# Patient Record
Sex: Female | Born: 1960 | Race: White | Hispanic: No | Smoking: Former smoker
Health system: Southern US, Community
[De-identification: ages and names within clinical notes are randomized; demographics above are authoritative.]

## PROBLEM LIST (undated history)

## (undated) DIAGNOSIS — N19 Unspecified kidney failure: Secondary | ICD-10-CM

## (undated) DIAGNOSIS — M797 Fibromyalgia: Secondary | ICD-10-CM

## (undated) DIAGNOSIS — Z87442 Personal history of urinary calculi: Secondary | ICD-10-CM

## (undated) DIAGNOSIS — Z22322 Carrier or suspected carrier of Methicillin resistant Staphylococcus aureus: Secondary | ICD-10-CM

## (undated) DIAGNOSIS — F410 Panic disorder [episodic paroxysmal anxiety] without agoraphobia: Secondary | ICD-10-CM

## (undated) DIAGNOSIS — T7840XA Allergy, unspecified, initial encounter: Secondary | ICD-10-CM

## (undated) DIAGNOSIS — J449 Chronic obstructive pulmonary disease, unspecified: Secondary | ICD-10-CM

## (undated) DIAGNOSIS — Z972 Presence of dental prosthetic device (complete) (partial): Secondary | ICD-10-CM

## (undated) DIAGNOSIS — R200 Anesthesia of skin: Secondary | ICD-10-CM

## (undated) DIAGNOSIS — F419 Anxiety disorder, unspecified: Secondary | ICD-10-CM

## (undated) DIAGNOSIS — L0291 Cutaneous abscess, unspecified: Secondary | ICD-10-CM

## (undated) DIAGNOSIS — F909 Attention-deficit hyperactivity disorder, unspecified type: Secondary | ICD-10-CM

## (undated) DIAGNOSIS — M62838 Other muscle spasm: Secondary | ICD-10-CM

## (undated) DIAGNOSIS — M1990 Unspecified osteoarthritis, unspecified site: Secondary | ICD-10-CM

## (undated) DIAGNOSIS — J189 Pneumonia, unspecified organism: Secondary | ICD-10-CM

## (undated) DIAGNOSIS — M549 Dorsalgia, unspecified: Secondary | ICD-10-CM

## (undated) DIAGNOSIS — F988 Other specified behavioral and emotional disorders with onset usually occurring in childhood and adolescence: Secondary | ICD-10-CM

## (undated) DIAGNOSIS — Q438 Other specified congenital malformations of intestine: Secondary | ICD-10-CM

## (undated) DIAGNOSIS — G8929 Other chronic pain: Secondary | ICD-10-CM

## (undated) DIAGNOSIS — F319 Bipolar disorder, unspecified: Secondary | ICD-10-CM

## (undated) HISTORY — DX: Unspecified osteoarthritis, unspecified site: M19.90

## (undated) HISTORY — DX: Anesthesia of skin: R20.0

## (undated) HISTORY — DX: Other muscle spasm: M62.838

## (undated) HISTORY — PX: INCISION AND DRAINAGE: SHX5863

## (undated) HISTORY — PX: BACK SURGERY: SHX140

## (undated) HISTORY — DX: Allergy, unspecified, initial encounter: T78.40XA

## (undated) HISTORY — PX: SHOULDER SURGERY: SHX246

## (undated) HISTORY — DX: Unspecified kidney failure: N19

## (undated) HISTORY — DX: Other specified congenital malformations of intestine: Q43.8

## (undated) HISTORY — PX: KNEE SURGERY: SHX244

## (undated) HISTORY — PX: NECK SURGERY: SHX720

## (undated) HISTORY — PX: CARPAL TUNNEL RELEASE: SHX101

---

## 1997-08-31 HISTORY — PX: ABDOMINAL HYSTERECTOMY: SHX81

## 1998-08-31 HISTORY — PX: HERNIA REPAIR: SHX51

## 2003-07-12 ENCOUNTER — Other Ambulatory Visit: Payer: Self-pay

## 2004-06-20 ENCOUNTER — Ambulatory Visit: Payer: Self-pay | Admitting: Specialist

## 2005-02-09 ENCOUNTER — Ambulatory Visit: Payer: Self-pay | Admitting: Surgery

## 2005-06-07 ENCOUNTER — Emergency Department: Payer: Self-pay | Admitting: Emergency Medicine

## 2005-06-12 ENCOUNTER — Other Ambulatory Visit: Payer: Self-pay

## 2005-06-12 ENCOUNTER — Inpatient Hospital Stay: Payer: Self-pay | Admitting: Internal Medicine

## 2005-11-19 ENCOUNTER — Ambulatory Visit: Payer: Self-pay | Admitting: Pain Medicine

## 2006-02-28 IMAGING — CR DG HAND COMPLETE 3+V*L*
1 series · 3 of 3 positions shown · non-contrast
Comparison: none

REASON FOR EXAM: swelling
COMMENTS:

PROCEDURE:     DXR - DXR HAND LT COMPLETE  W/OBLIQUES  - June 07, 2005  [DATE]
RESULT:       Three views reveals no acute fractures or dislocations.  The
joint spaces are intact.
There does appear soft tissue swelling particularly over the dorsum of the
metacarpals.

[Series 1: view not recorded · 0.17mm/px · 3 of 3 slices shown]
[im 1/3]
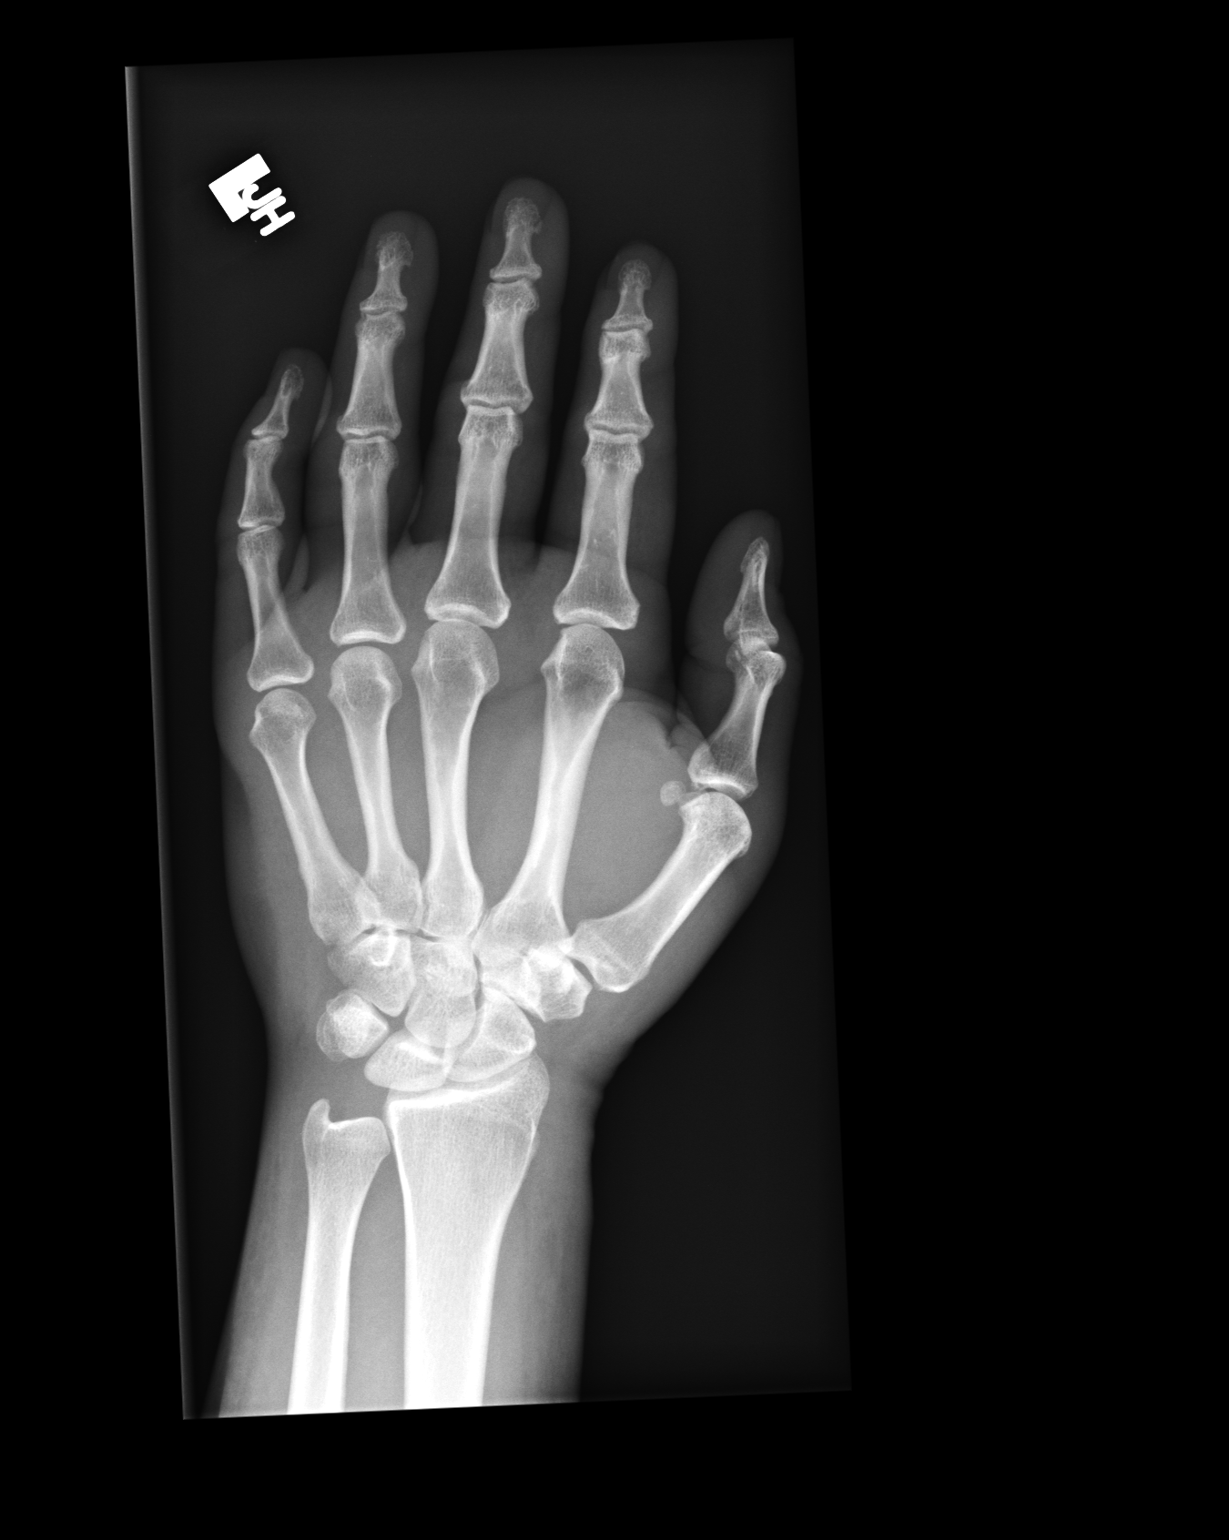
[im 2/3]
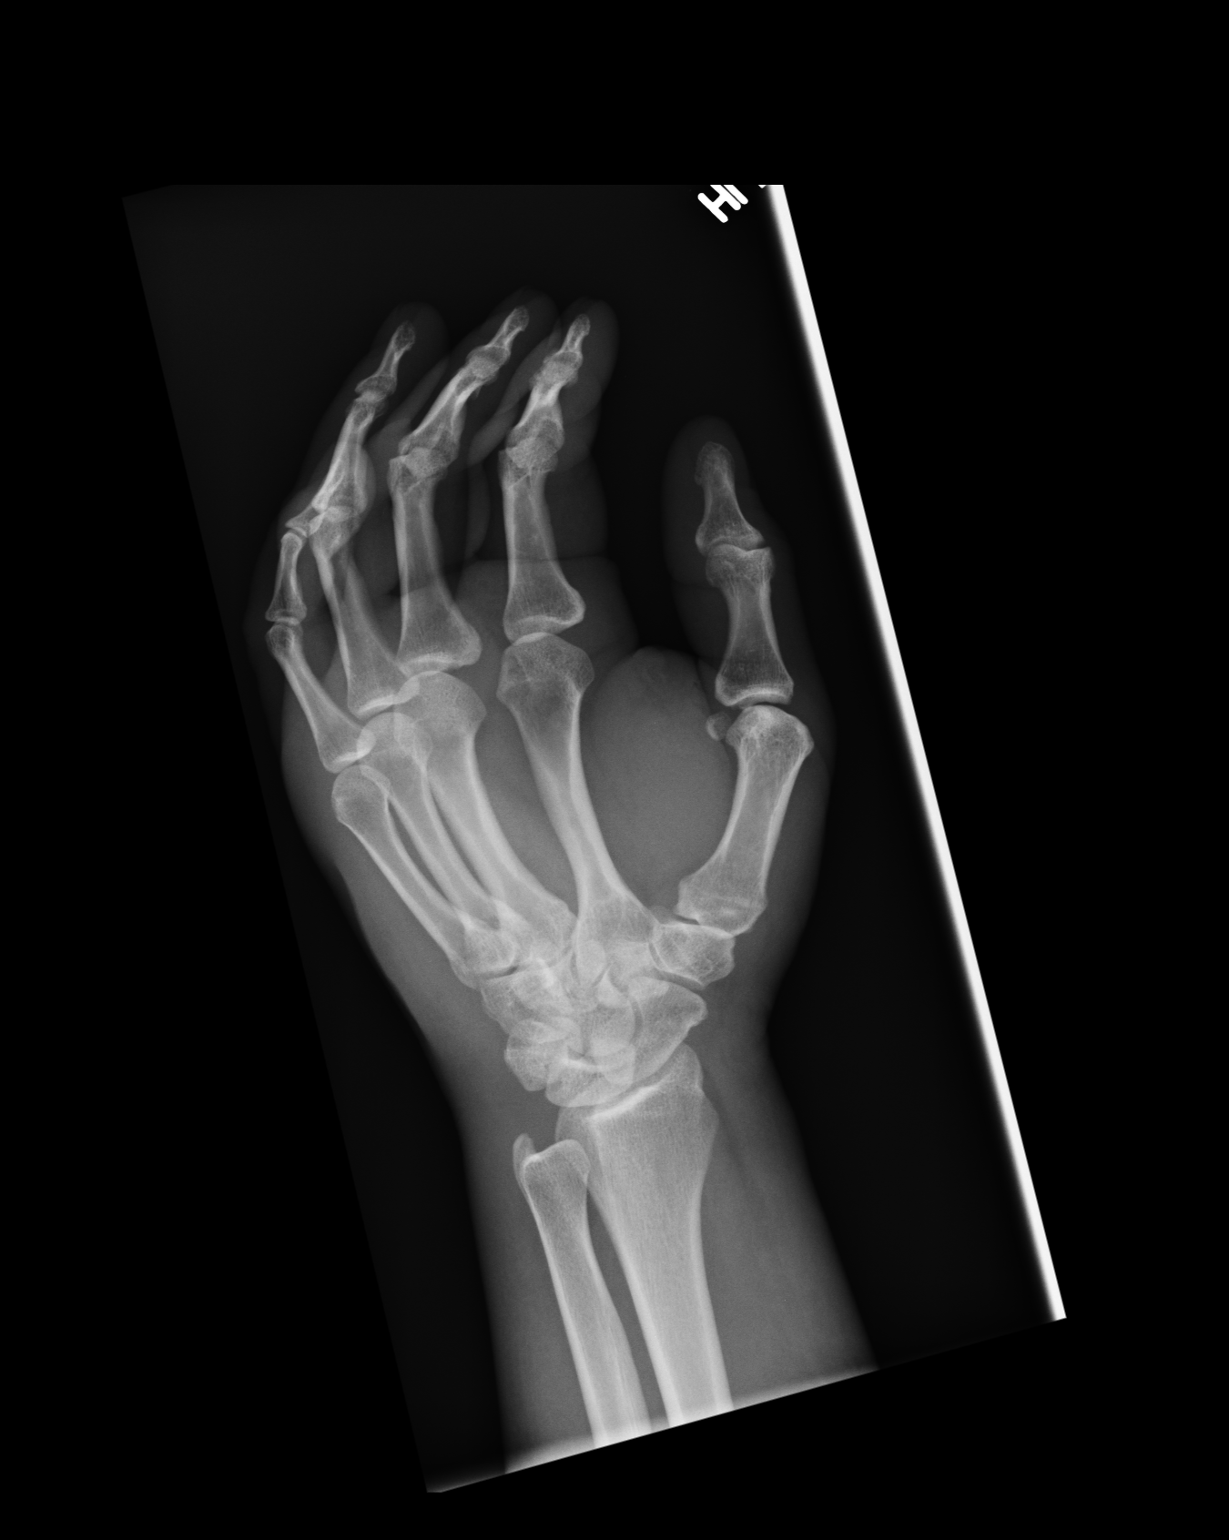
[im 3/3]
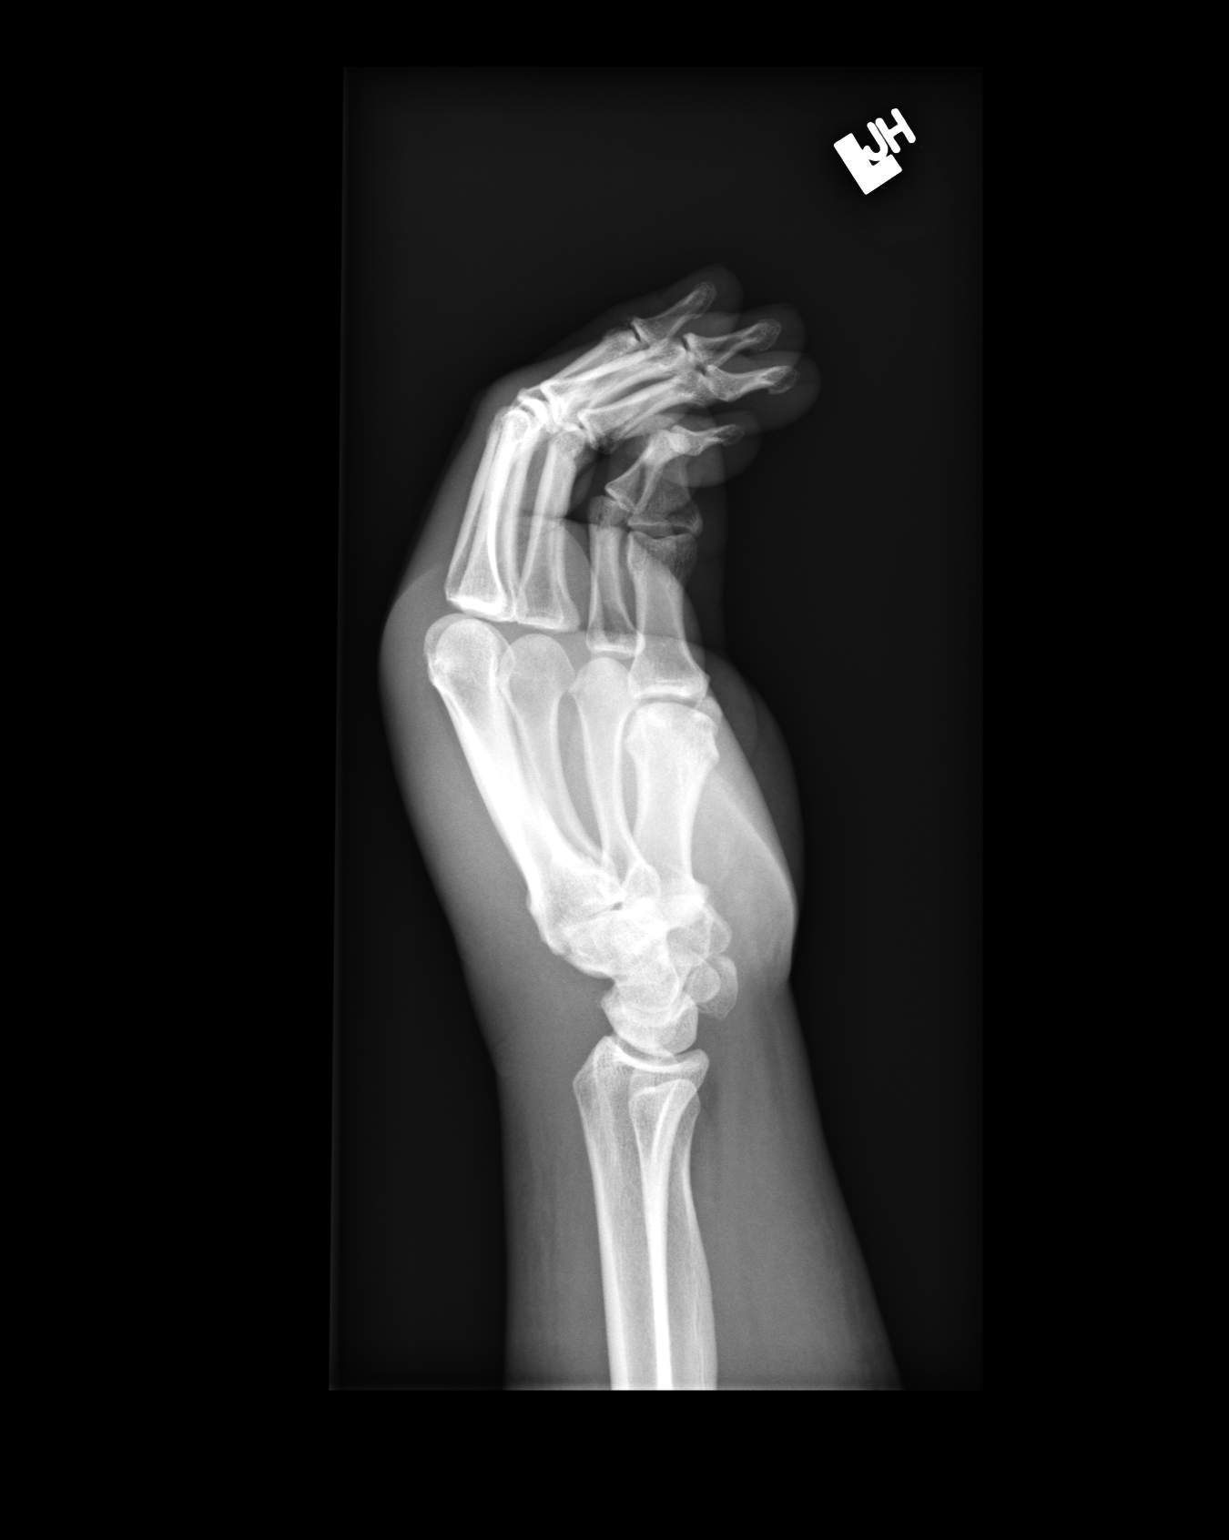

[3 of 3 positions shown; findings below may reference images not displayed]

IMPRESSION: No acute fractures are seen of the LEFT hand.  The underlying bones appear
intact.  Soft tissue swelling is noted.

## 2006-07-21 ENCOUNTER — Emergency Department: Payer: Self-pay | Admitting: Emergency Medicine

## 2006-09-03 ENCOUNTER — Inpatient Hospital Stay: Payer: Self-pay | Admitting: Psychiatry

## 2006-09-03 ENCOUNTER — Observation Stay: Payer: Self-pay | Admitting: Internal Medicine

## 2006-09-16 ENCOUNTER — Emergency Department: Payer: Self-pay | Admitting: Emergency Medicine

## 2006-09-30 ENCOUNTER — Ambulatory Visit: Payer: Self-pay | Admitting: Pain Medicine

## 2006-10-13 ENCOUNTER — Ambulatory Visit: Payer: Self-pay | Admitting: Pain Medicine

## 2006-12-02 ENCOUNTER — Emergency Department: Payer: Self-pay | Admitting: Unknown Physician Specialty

## 2007-02-17 ENCOUNTER — Emergency Department: Payer: Self-pay | Admitting: Emergency Medicine

## 2007-04-18 ENCOUNTER — Emergency Department: Payer: Self-pay | Admitting: Emergency Medicine

## 2007-05-27 IMAGING — CT CT HEAD WITHOUT CONTRAST
2 series · 16 of 30 positions shown, 20 images · non-contrast
Comparison: none

REASON FOR EXAM: AMS, [id number]
COMMENTS:

[Series 2: without · axial · non-contrast · 0.44mm/px · z∈[+877,+1000]mm · 13 of 31 slices shown, 17 images]
[im 3/31  brain]
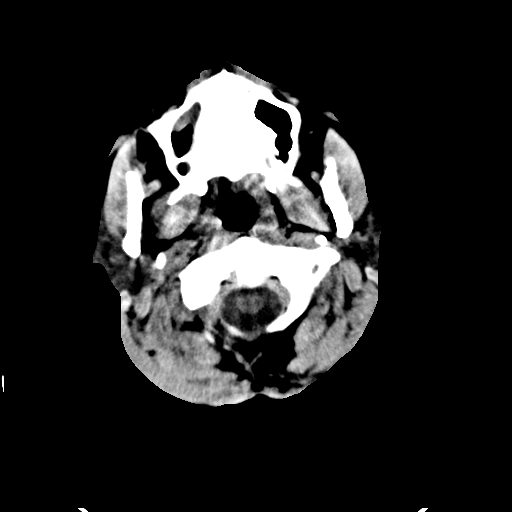
[im 3/31  bone]
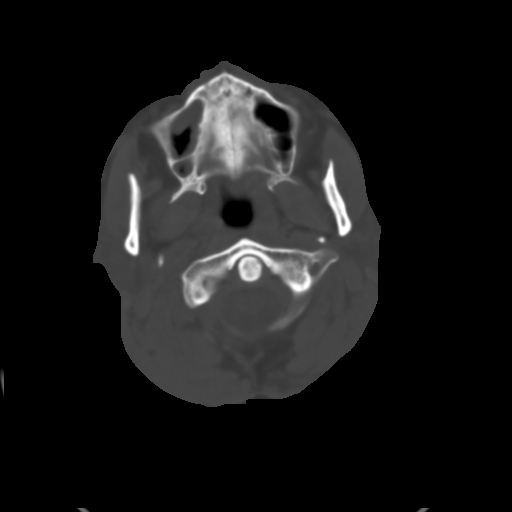
[im 5/31  brain]
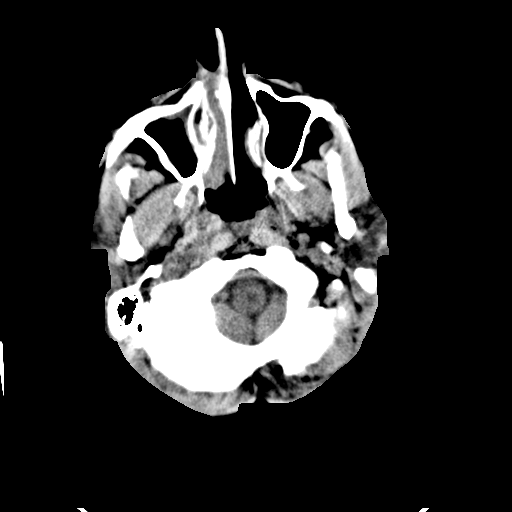
[im 7/31  brain]
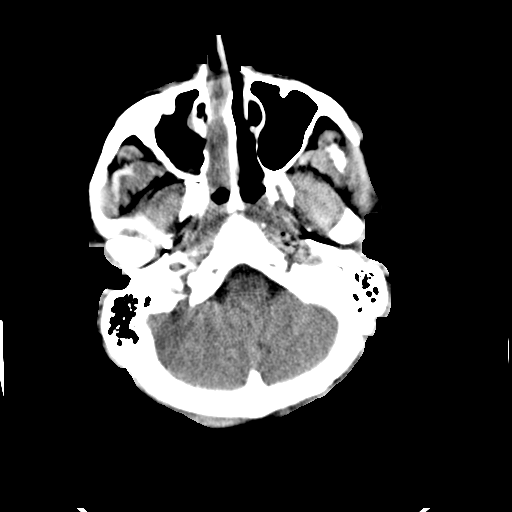
[im 9/31  brain]
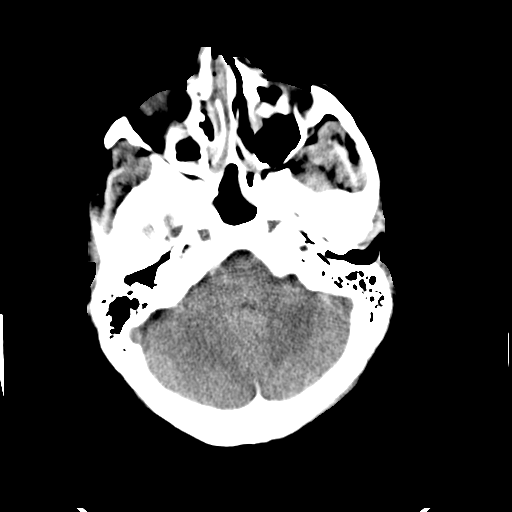
[im 11/31  brain]
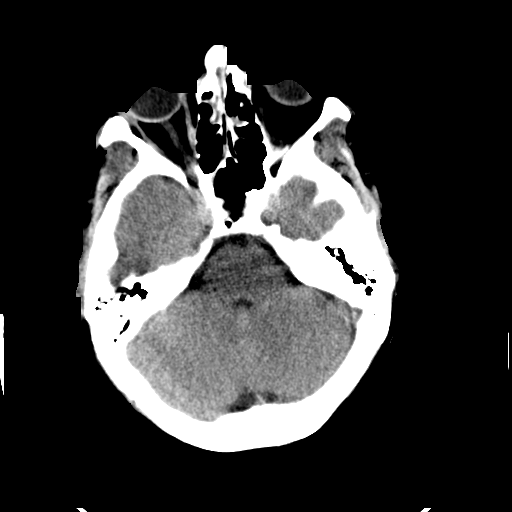
[im 11/31  bone]
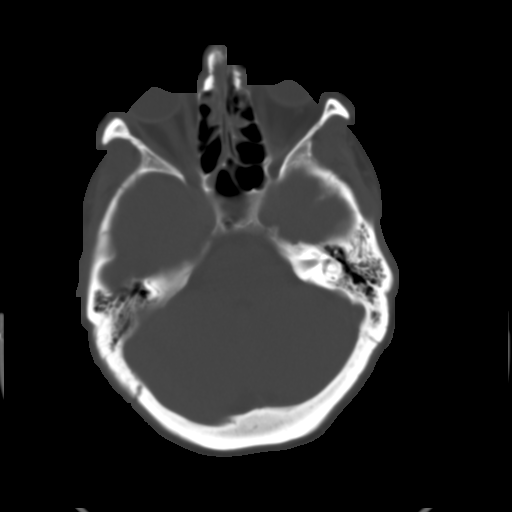
[im 13/31  brain]
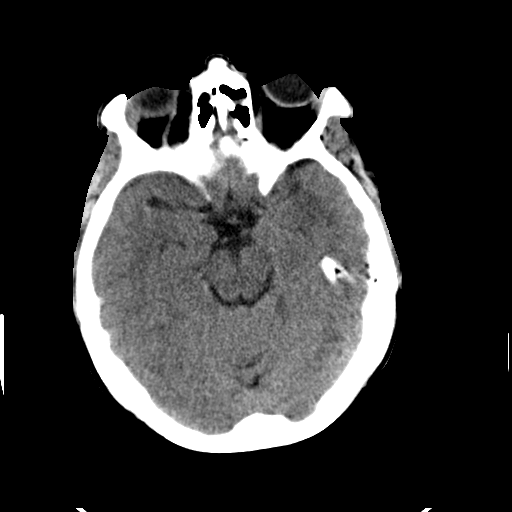
[im 16/31  brain]
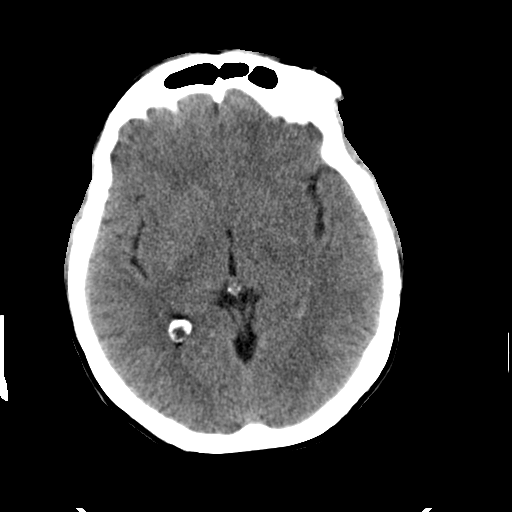
[im 18/31  brain]
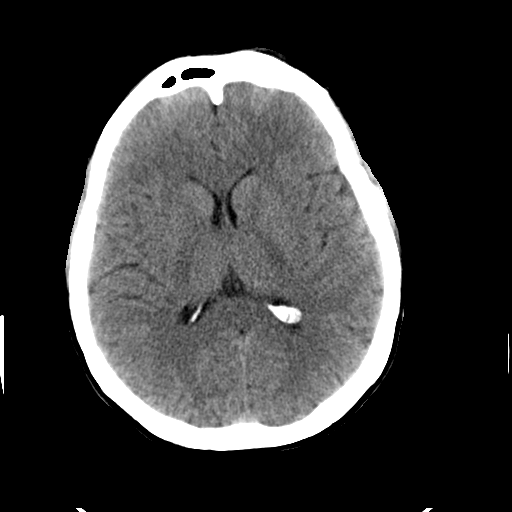
[im 20/31  brain]
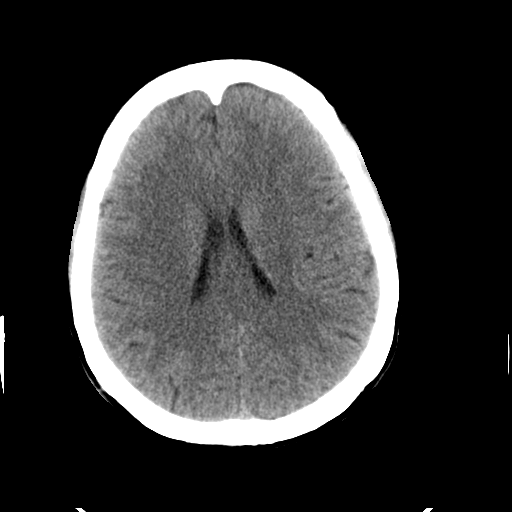
[im 20/31  bone]
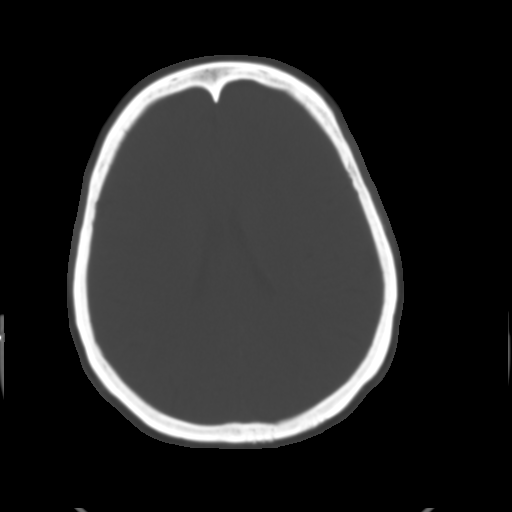
[im 22/31  brain]
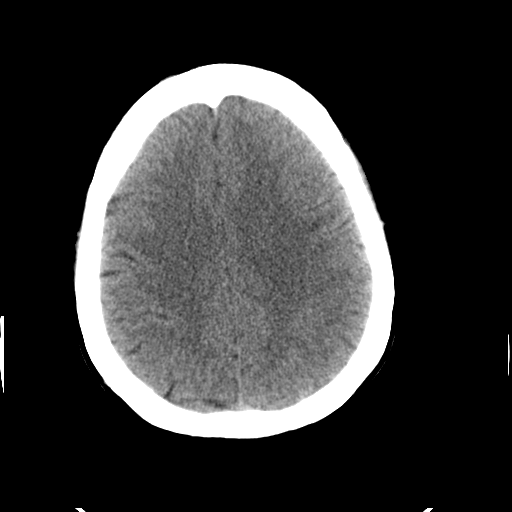
[im 24/31  brain]
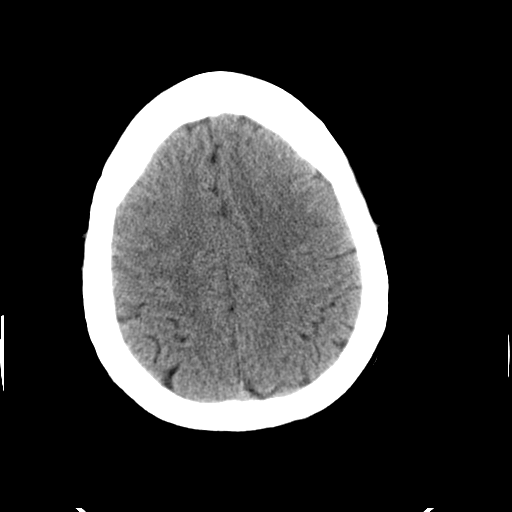
[im 26/31  brain]
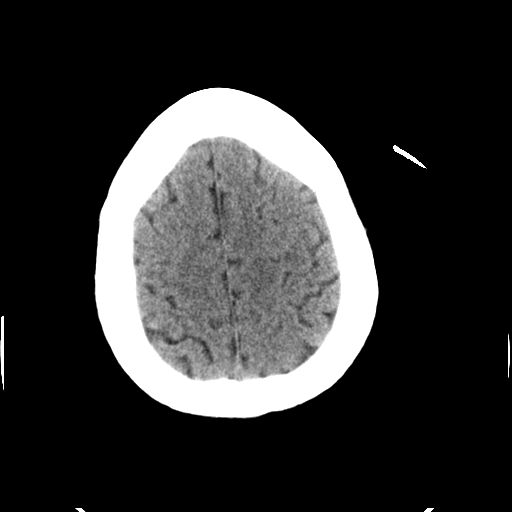
[im 28/31  brain]
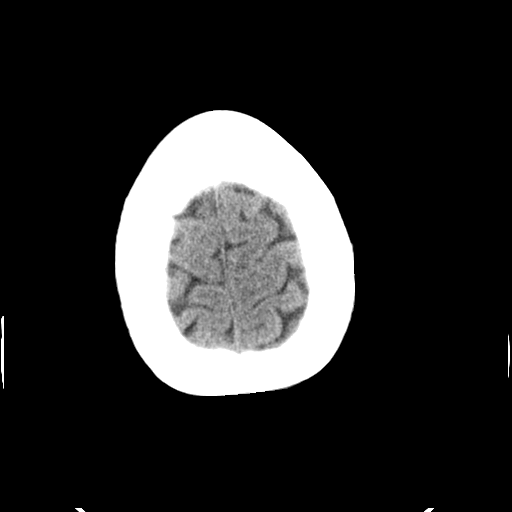
[im 28/31  bone]
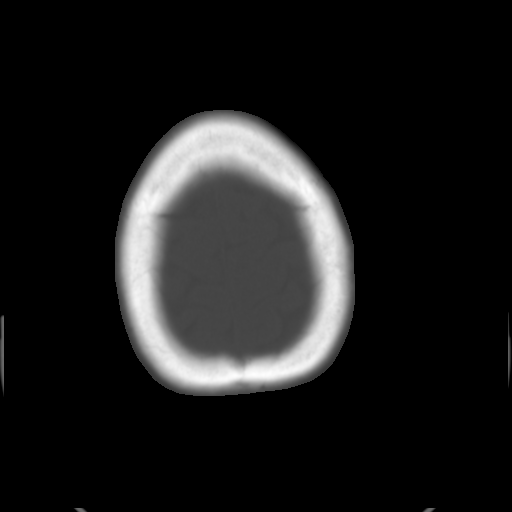

[Series 3: bone · axial · 0.44mm/px · z∈[+877,+917]mm · 3 of 31 slices shown]
[im 3/31  bone]
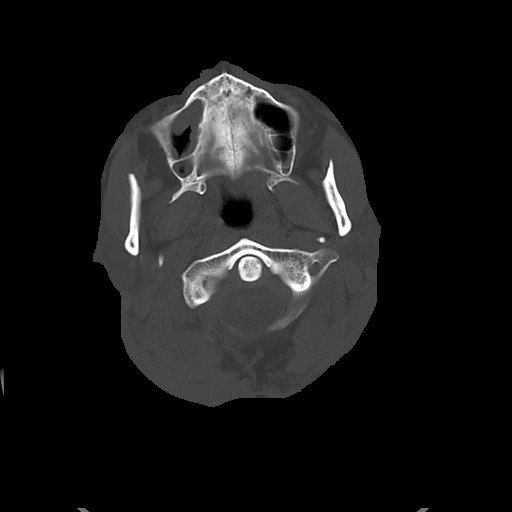
[im 7/31  bone]
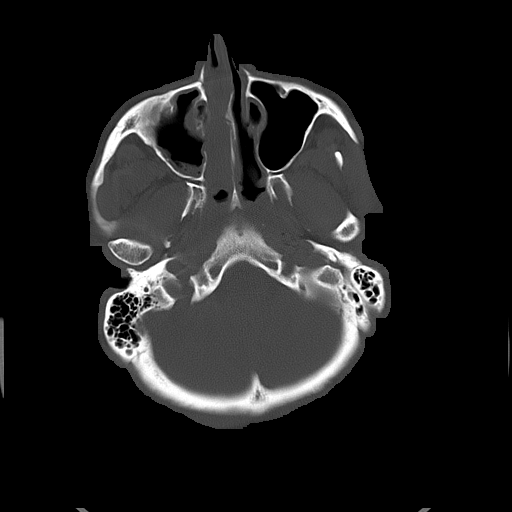
[im 11/31  bone]
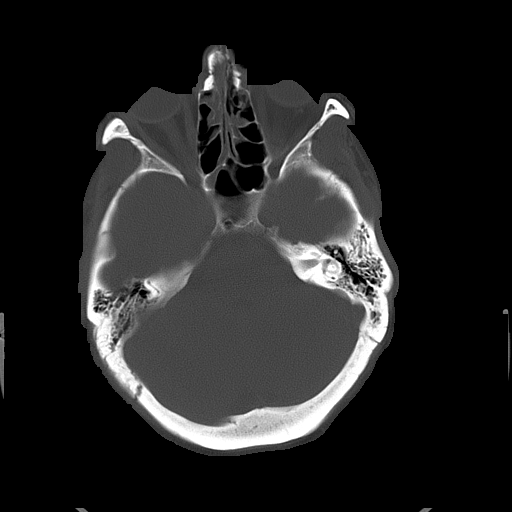

[16 of 30 positions shown; findings below may reference images not displayed]

PROCEDURE:     CT  - CT HEAD WITHOUT CONTRAST  - September 03, 2006  [DATE]

RESULT:       Unenhanced emergent Head CT was performed for altered mental
status.  Report was faxed to the emergency room.

No intracerebral bleed or infarcts.  No mass effect and no shift of the
midline.   No extraaxial fluid collections.  The ventricles appear within
normal limits in size.

On bone window settings, there is noted some opacification in the ethmoids
with minimal mucoperiosteal thickening in the RIGHT maxillary sinus which
can be compatible with a sinusitis.  The remainder of the sinuses are clear
as well as the mastoids.
IMPRESSION: 1.     No acute processes intracranially.
2.     RIGHT sphenoid and RIGHT maxillary sinuses.
3.     Incidentally noted is a fracture of the nasal bone in the RIGHT nasal
ala.

## 2007-05-27 IMAGING — CR DG CHEST 1V PORT
1 series · 1 of 1 positions shown · non-contrast
Comparison: none

REASON FOR EXAM: ams//rm19
COMMENTS:

PROCEDURE:     DXR - DXR PORTABLE CHEST SINGLE VIEW  - September 03, 2006  [DATE]
RESULT:     There is atelectasis at the RIGHT lung base.  The heart is top
normal in size.  The pulmonary vascularity is not engorged and I see no
pleural effusion. No acute bony abnormality is identified.

[view not recorded]
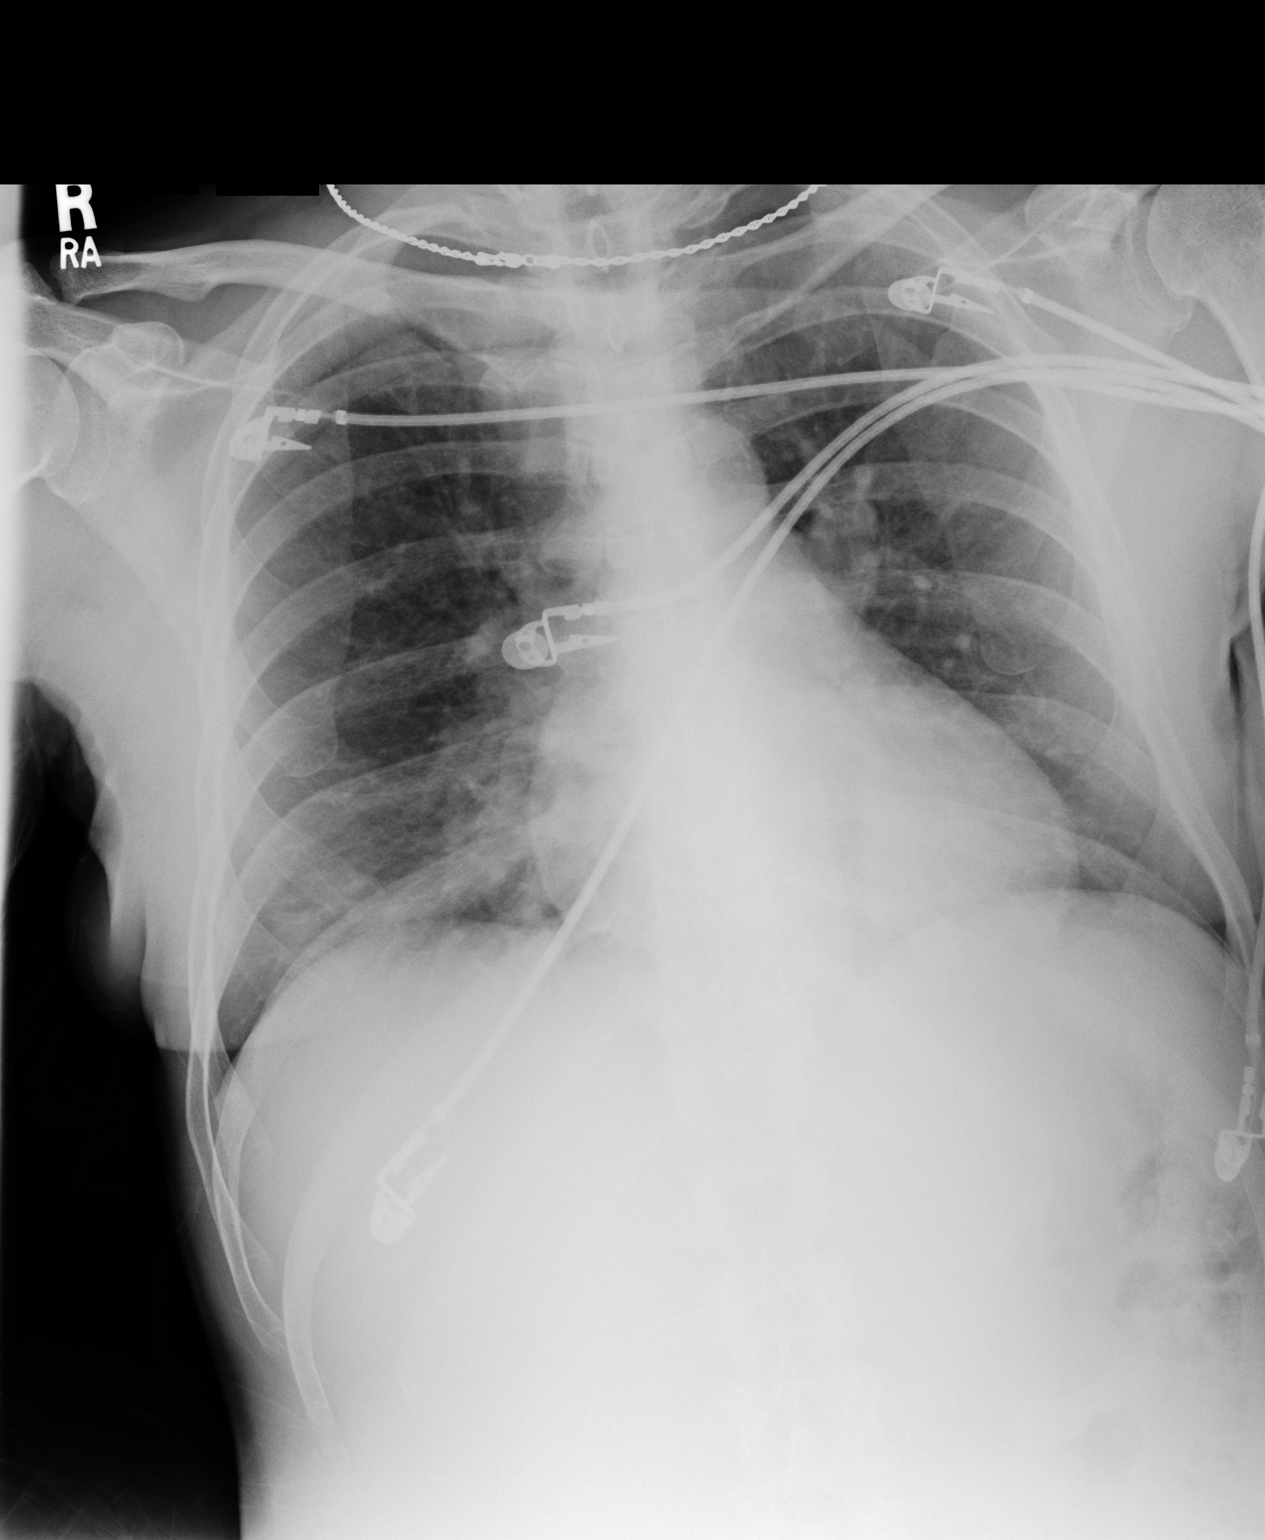

[1 of 1 positions shown; findings below may reference images not displayed]

IMPRESSION: 1)There is basilar atelectasis versus infiltrate on the RIGHT. There is
subtle increased density in the retrocardiac region on the LEFT.  A
follow-up PA and lateral chest x-ray would be of value.

## 2007-06-09 IMAGING — CR SACRUM AND COCCYX - 2+ VIEW
1 series · 3 of 3 positions shown · non-contrast
Comparison: none

REASON FOR EXAM: Fell
COMMENTS:  LMP: Post Hysterectomy

PROCEDURE:     DXR - DXR SACRUM AND COCCYX  - September 16, 2006 [DATE]
RESULT:     AP and lateral views of the sacrum and coccyx show no fracture
or other acute bony abnormality.

[Series 1: view not recorded · 0.17mm/px · 3 of 3 slices shown]
[im 1/3]
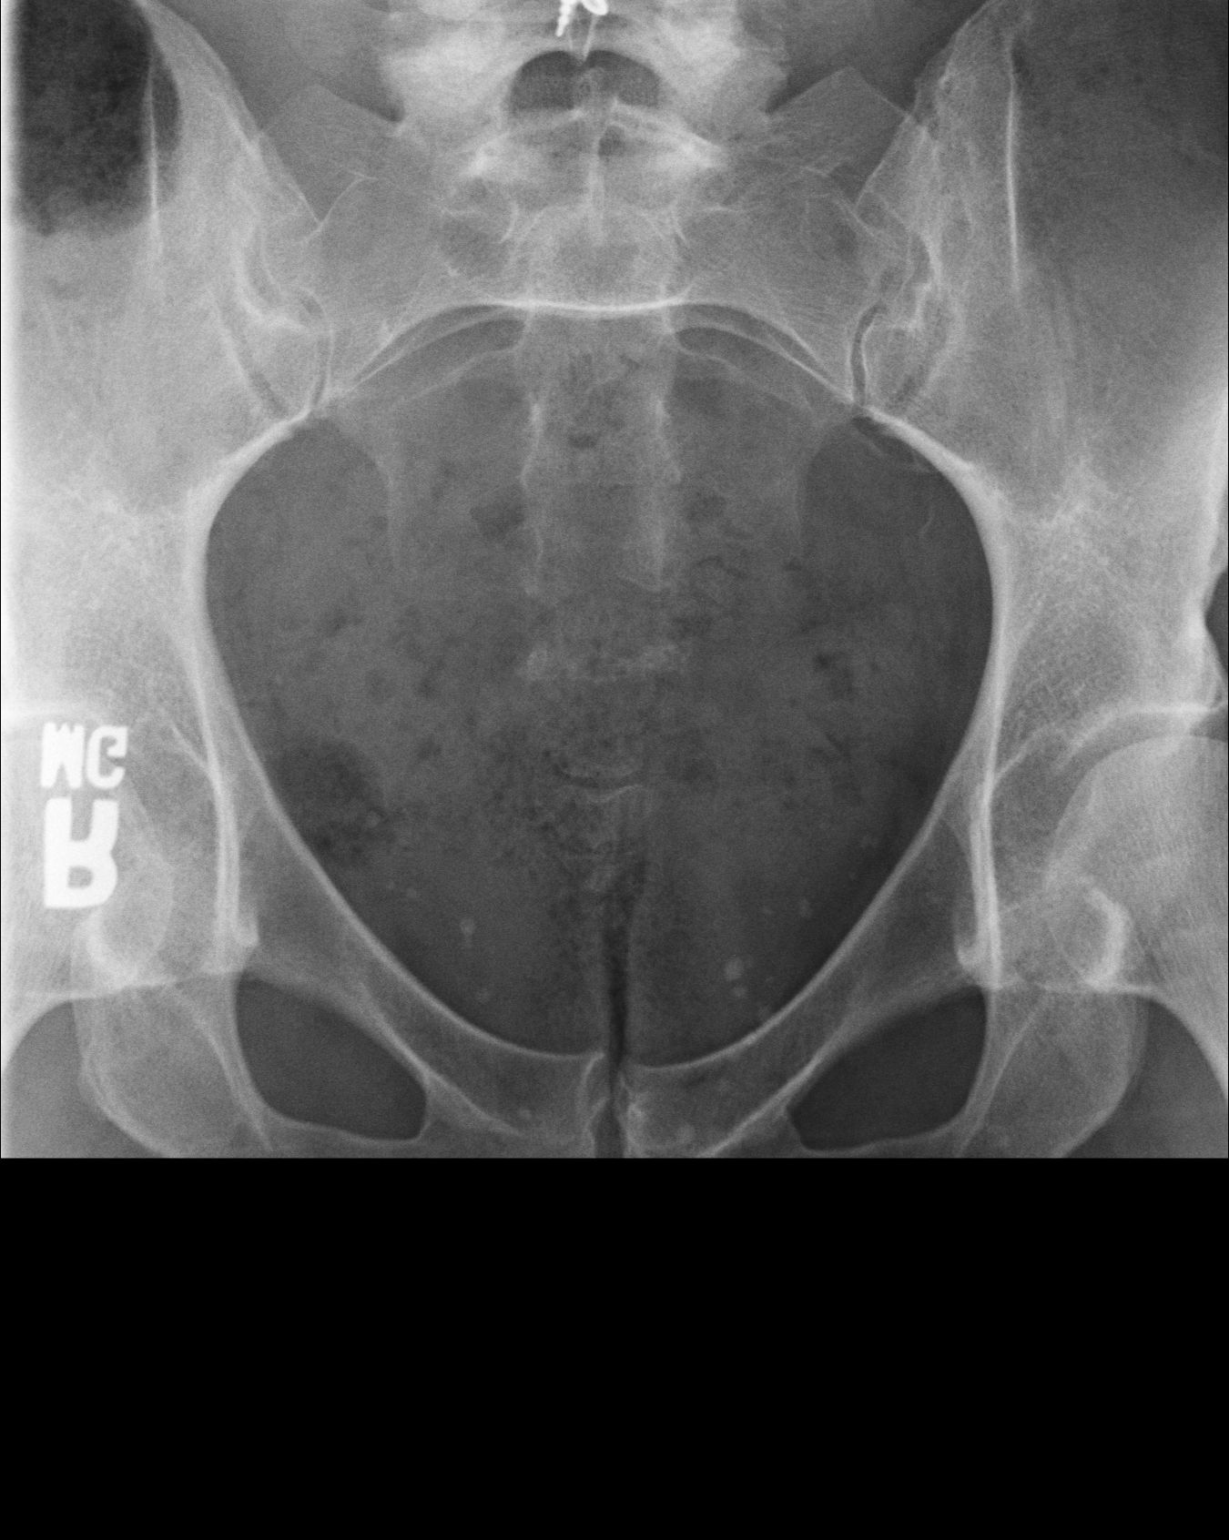
[im 2/3]
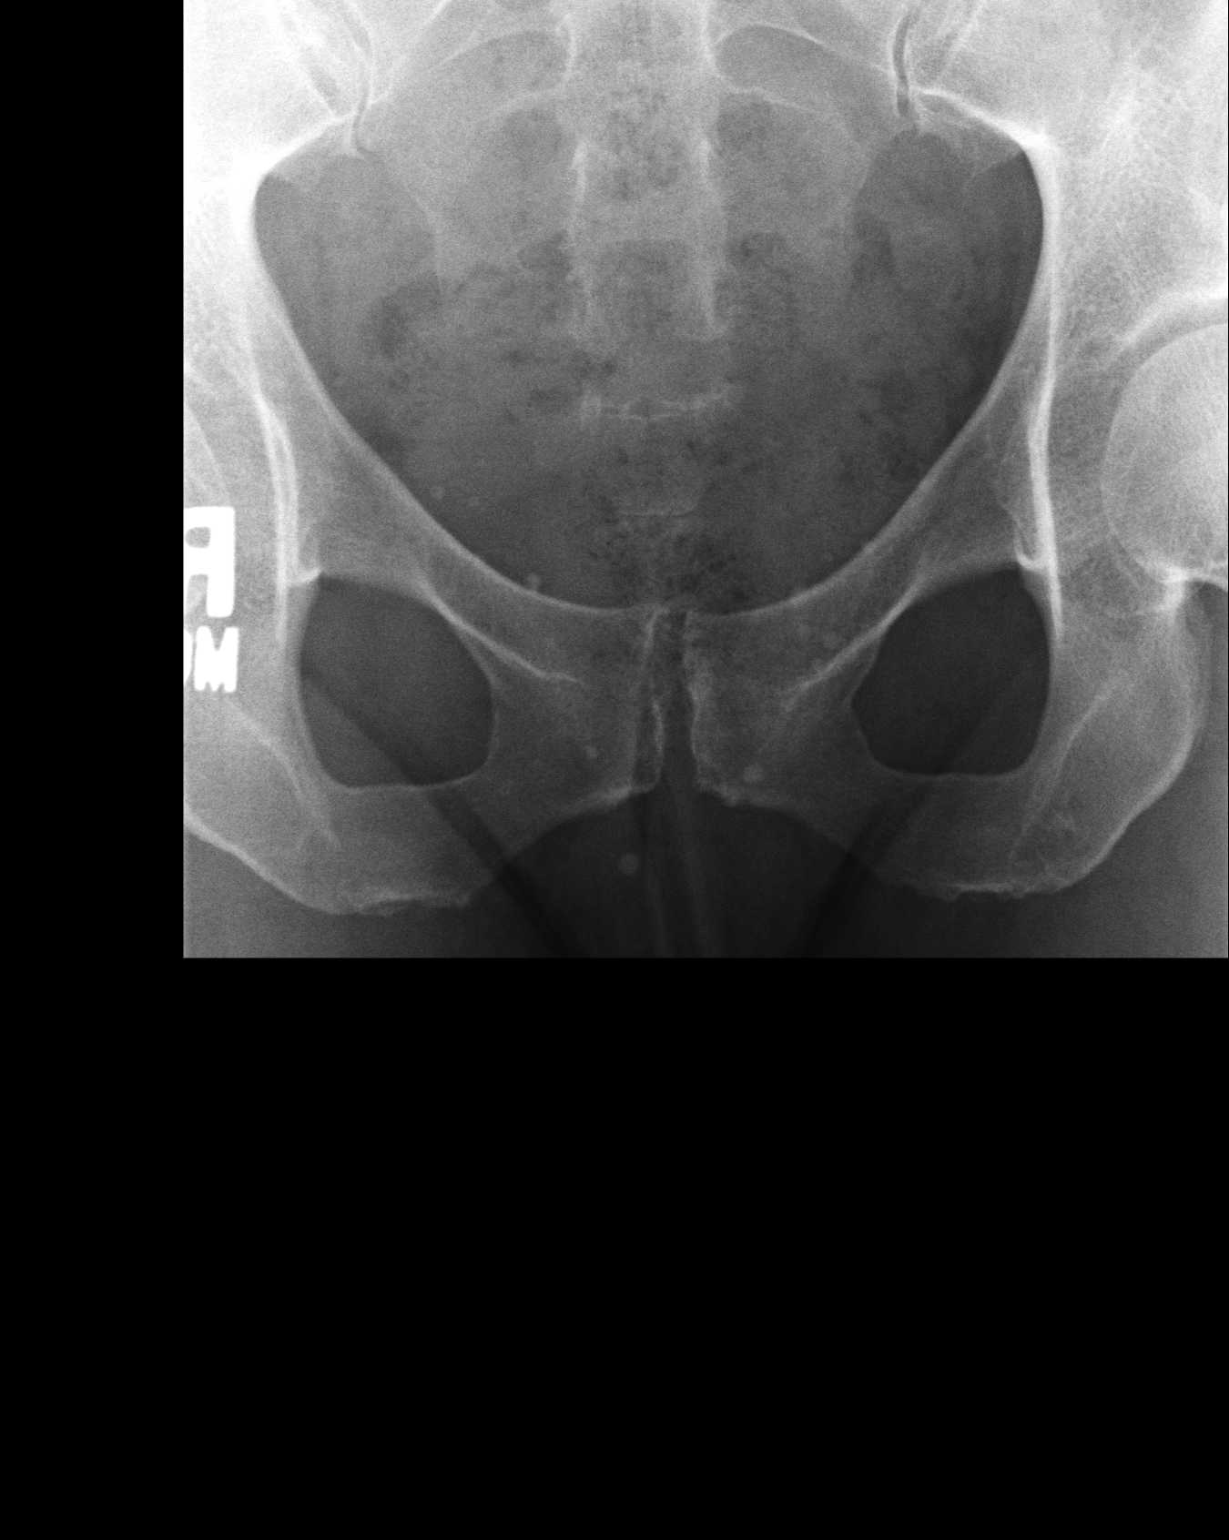
[im 3/3]
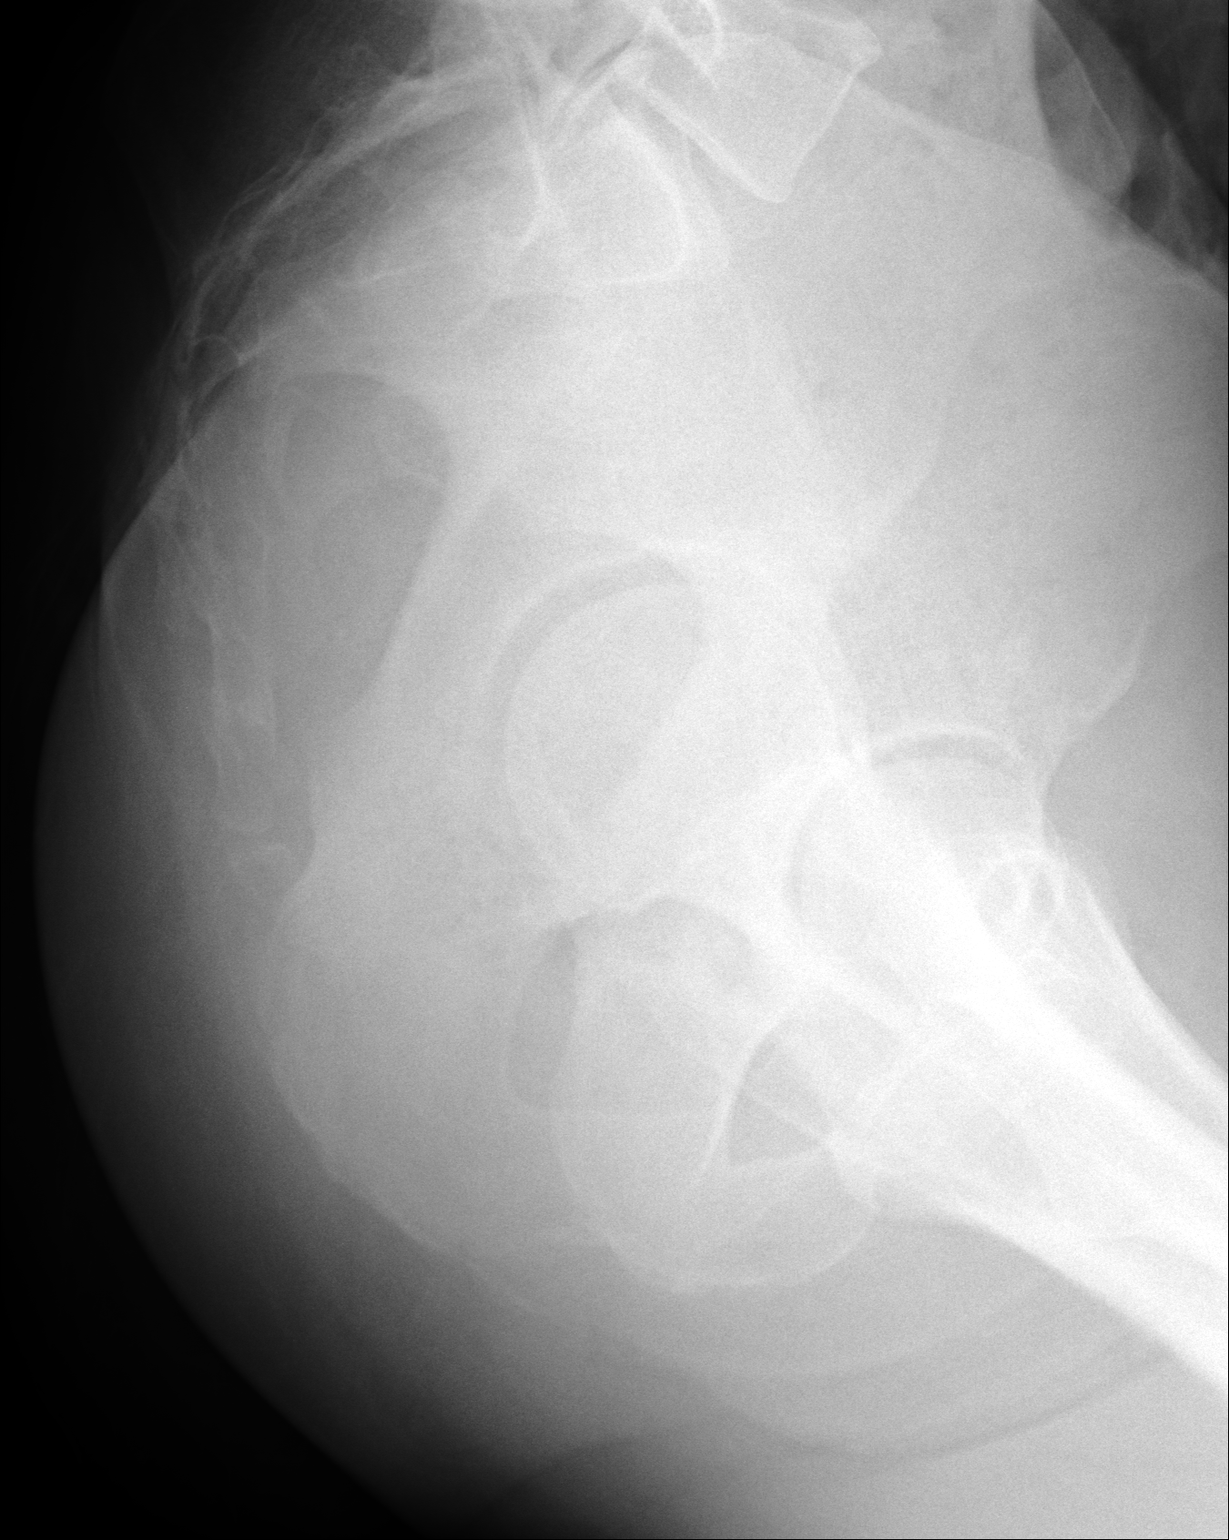

[3 of 3 positions shown; findings below may reference images not displayed]

IMPRESSION: No acute changes are identified.

## 2007-06-09 IMAGING — CR DG THORACIC SPINE 2-3V
1 series · 6 of 6 positions shown · non-contrast
Comparison: none

REASON FOR EXAM: Fell
COMMENTS:  LMP: Post Hysterectomy

[Series 1: view not recorded · 0.17mm/px · 6 of 6 slices shown]
[im 1/6]
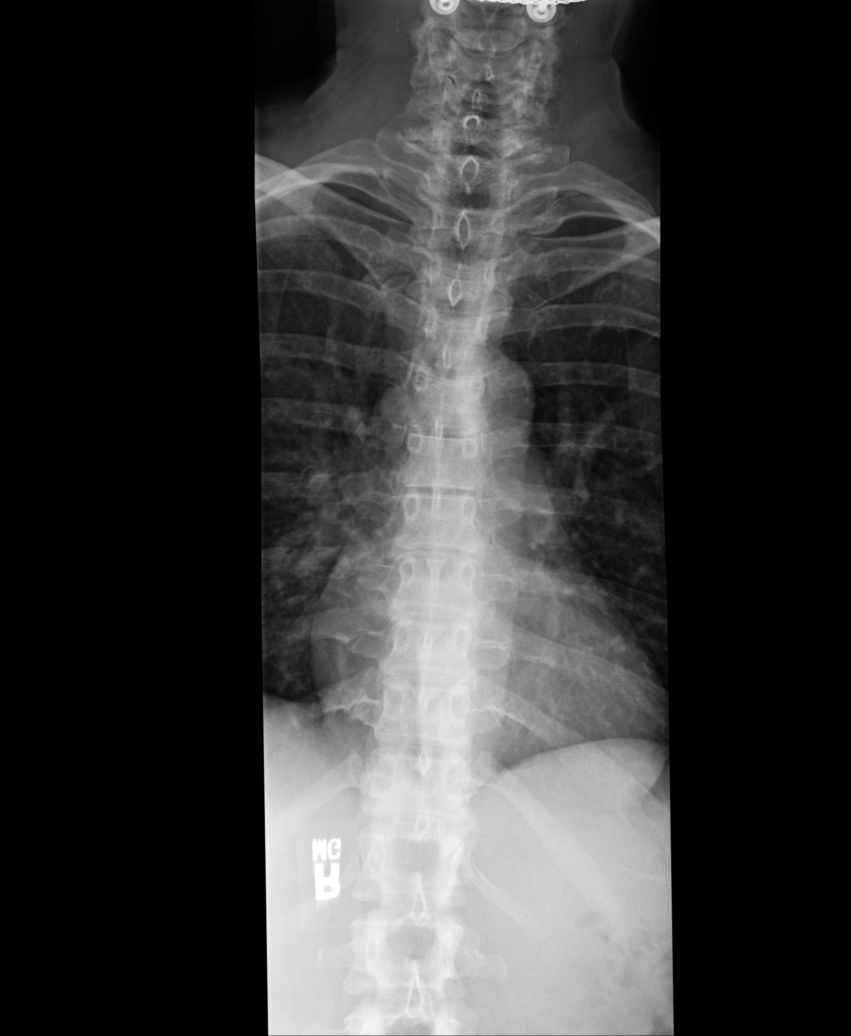
[im 2/6]
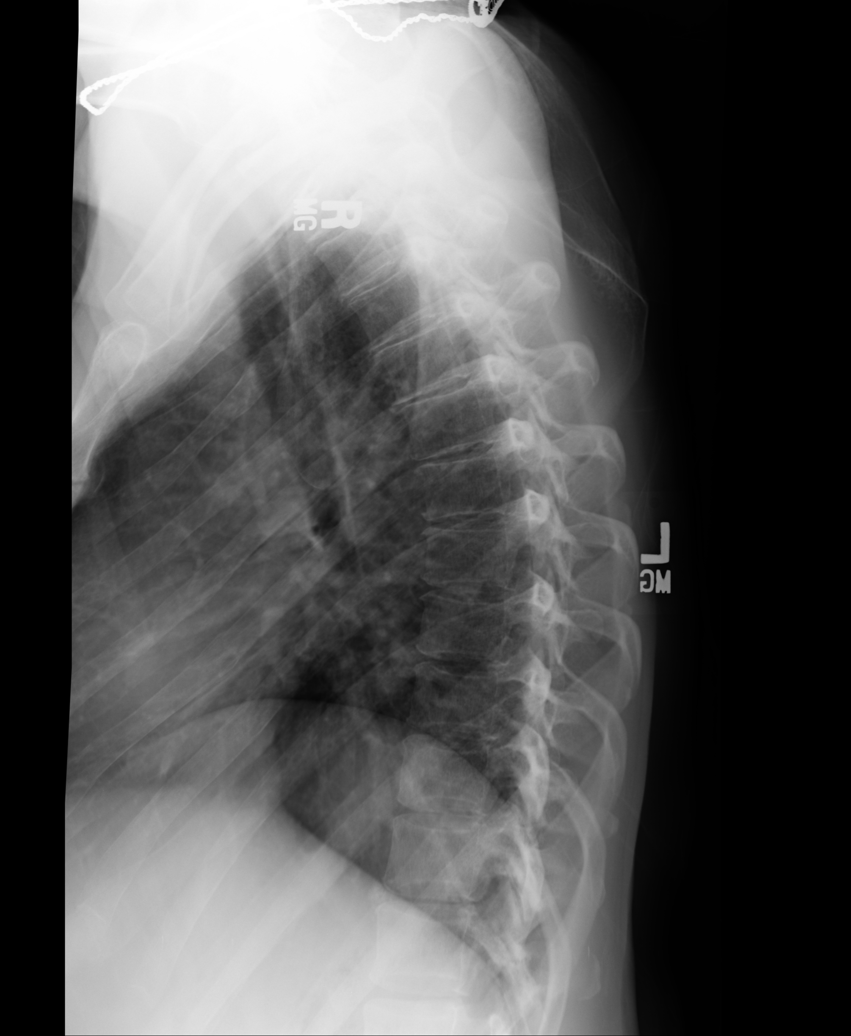
[im 3/6]
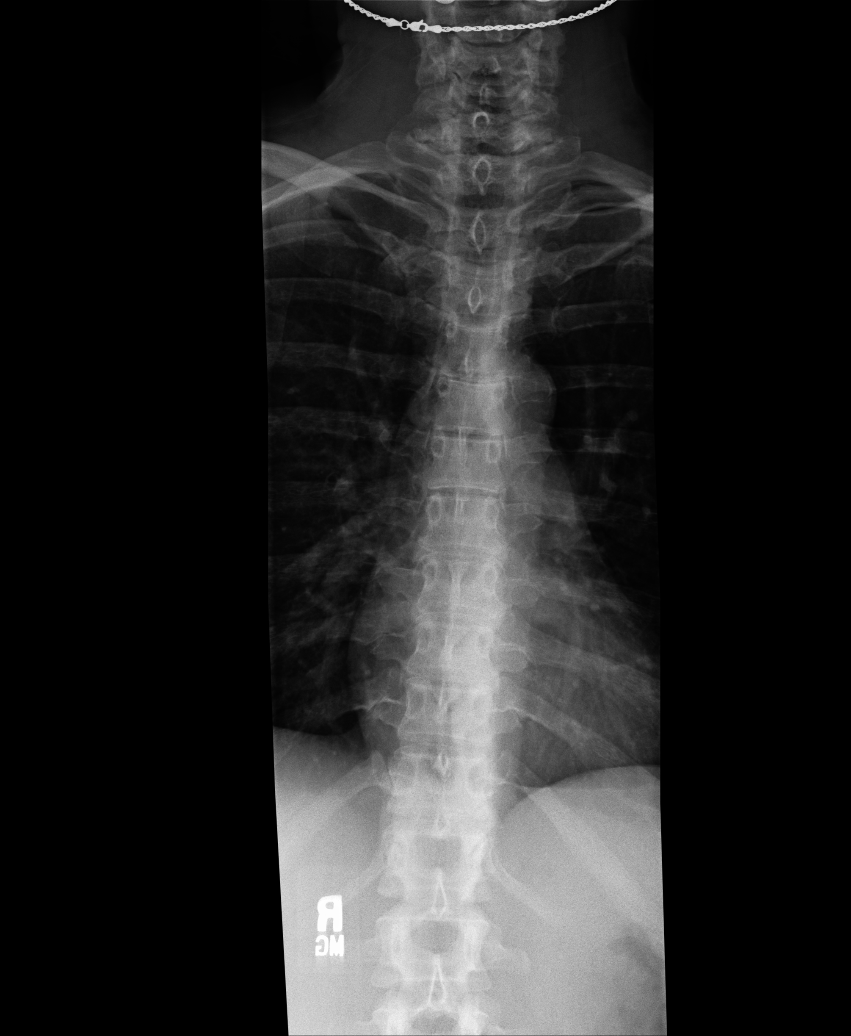
[im 4/6]
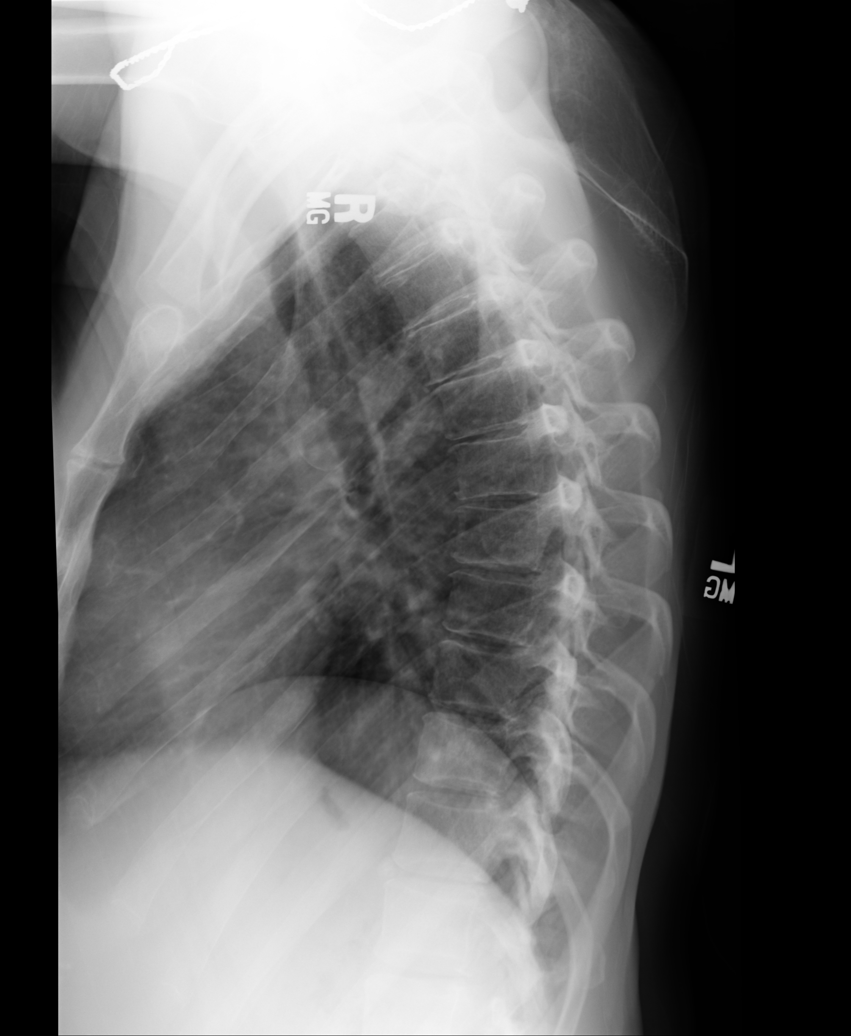
[im 5/6]
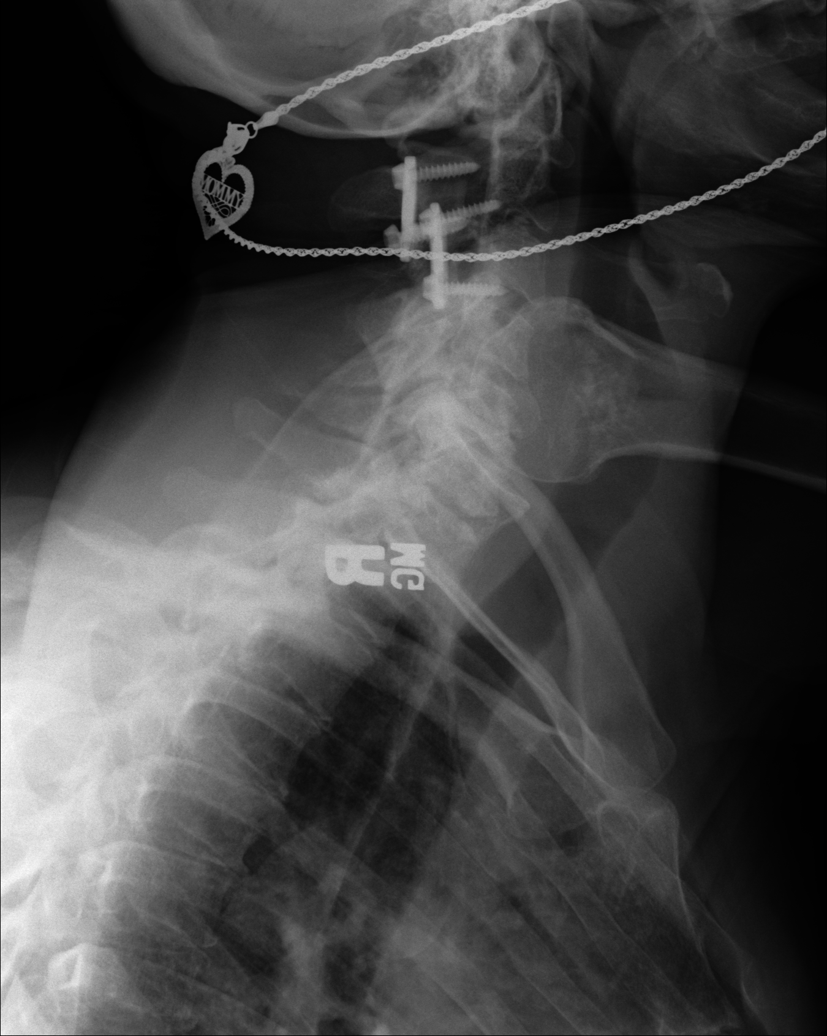
[im 6/6]
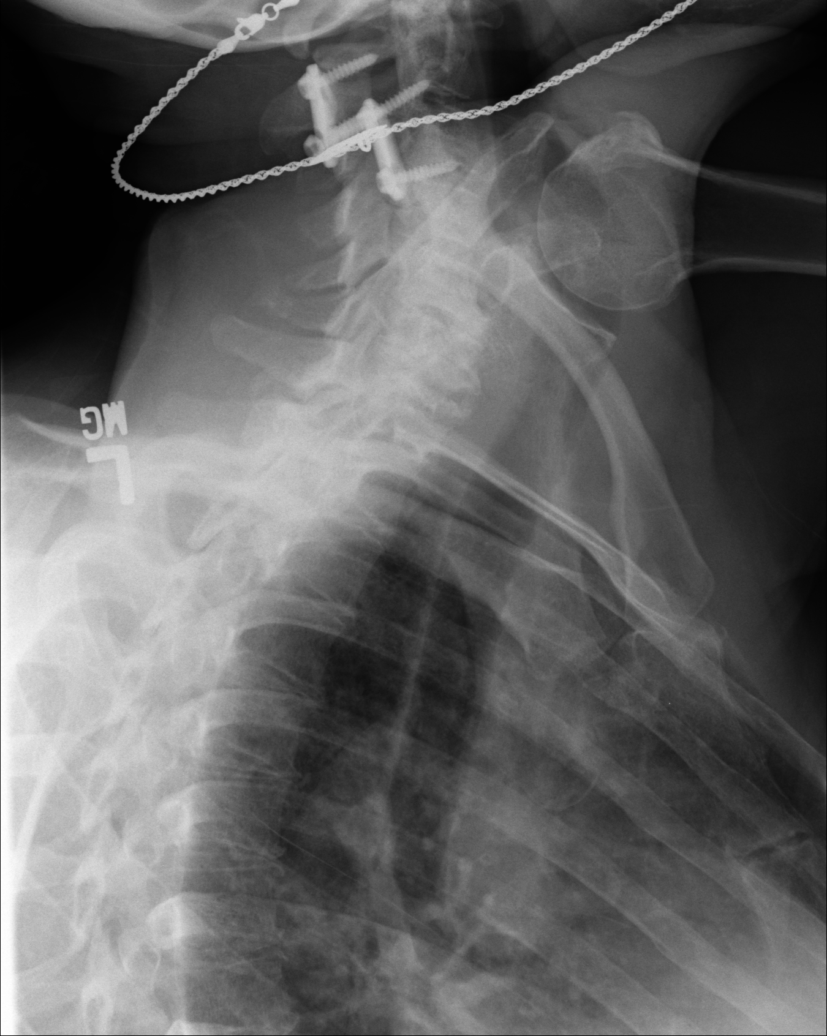

[6 of 6 positions shown; findings below may reference images not displayed]

PROCEDURE:     DXR - DXR THORACIC  AP AND LATERAL  - September 16, 2006 [DATE]

RESULT:     AP and lateral views of the thoracic spine show the vertebral
body heights and the intervertebral disc spaces to be well maintained. The
vertebral body alignment is normal. The pedicles are bilaterally intact.
IMPRESSION: No acute changes are identified.

## 2007-06-09 IMAGING — CR DG LUMBAR SPINE 2-3V
1 series · 3 of 3 positions shown · non-contrast
Comparison: none

REASON FOR EXAM: mc1- pain
COMMENTS:

[Series 1: view not recorded · 0.17mm/px · 3 of 3 slices shown]
[im 1/3]
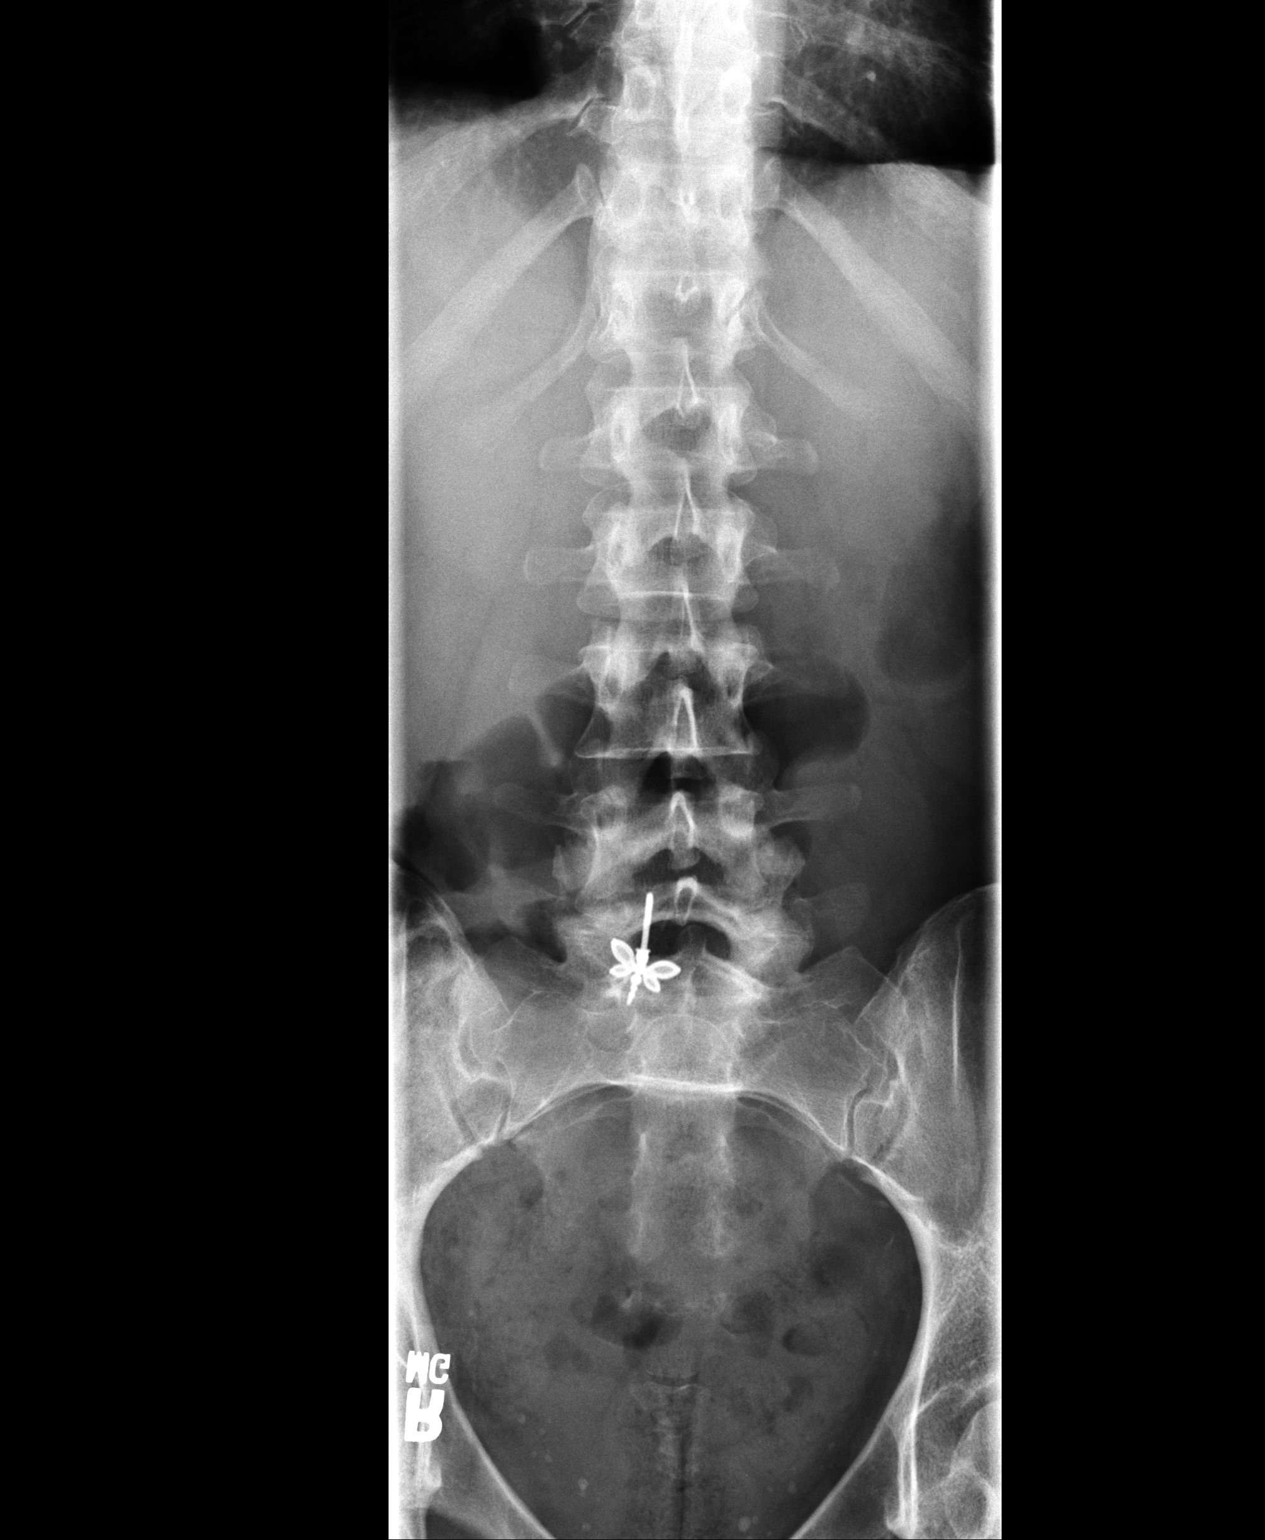
[im 2/3]
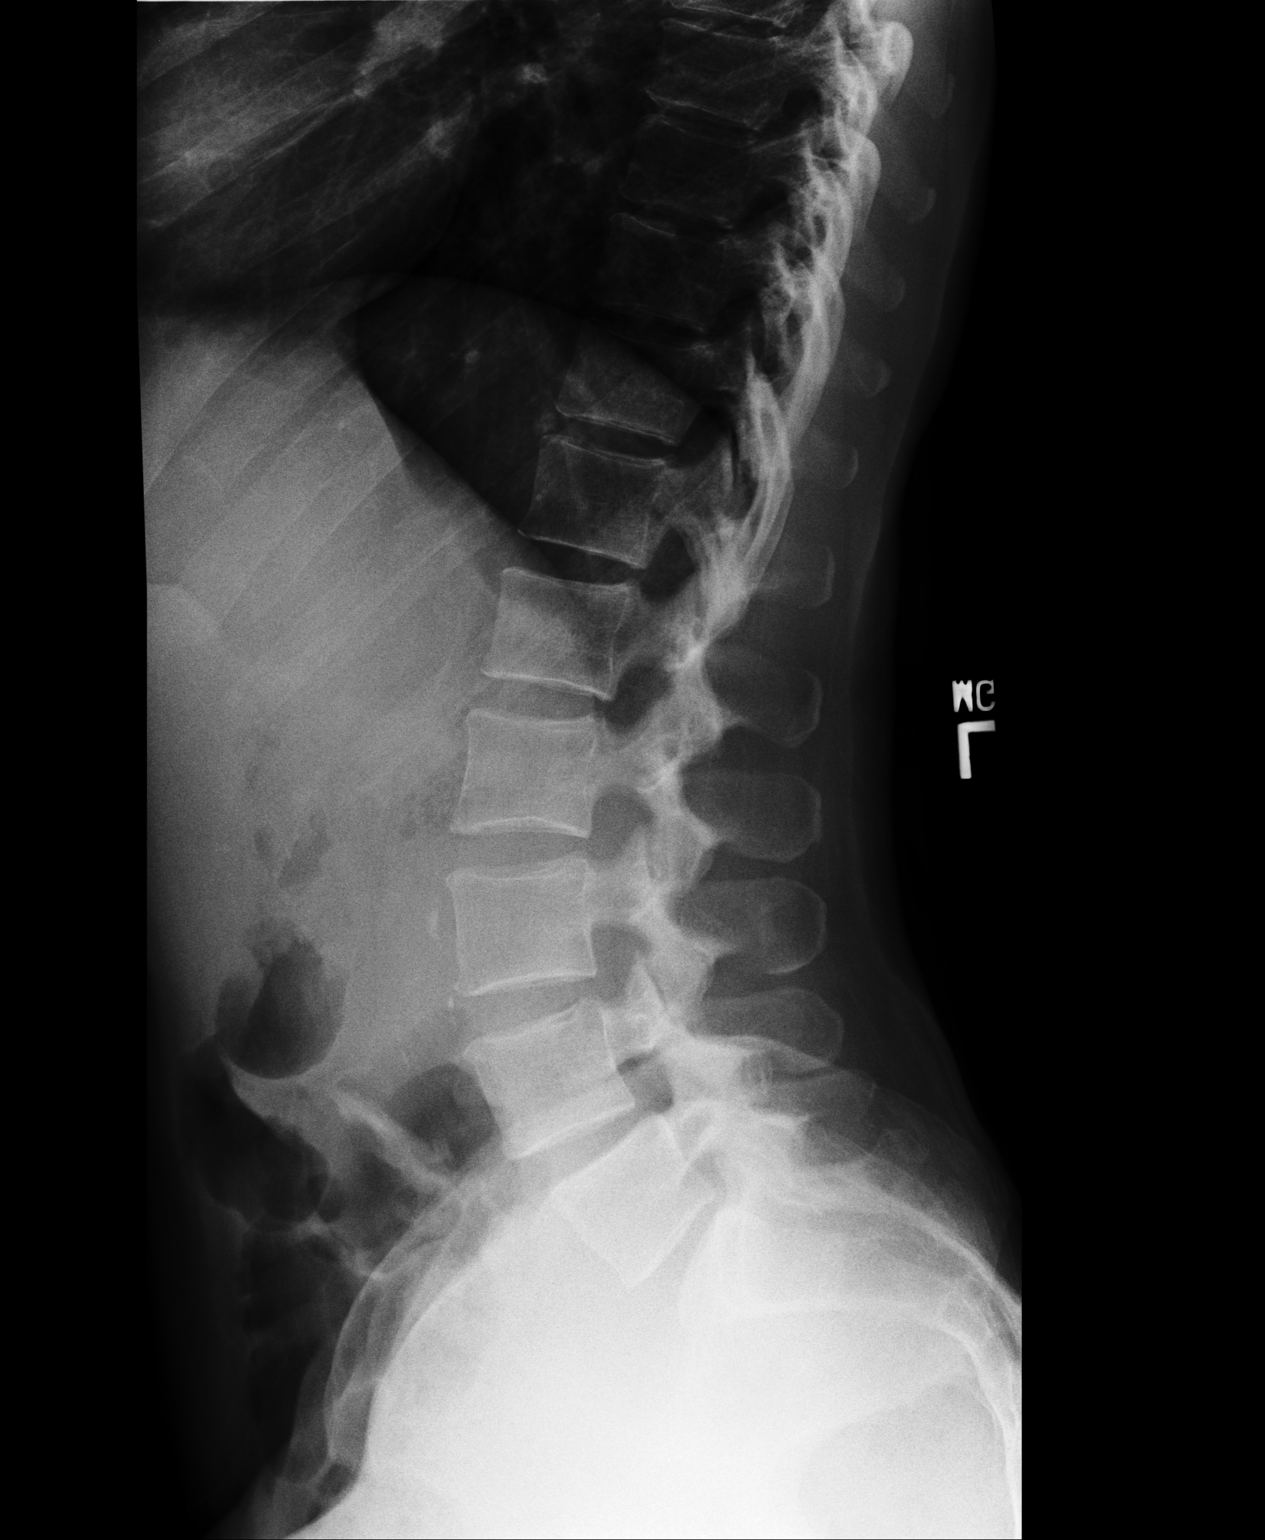
[im 3/3]
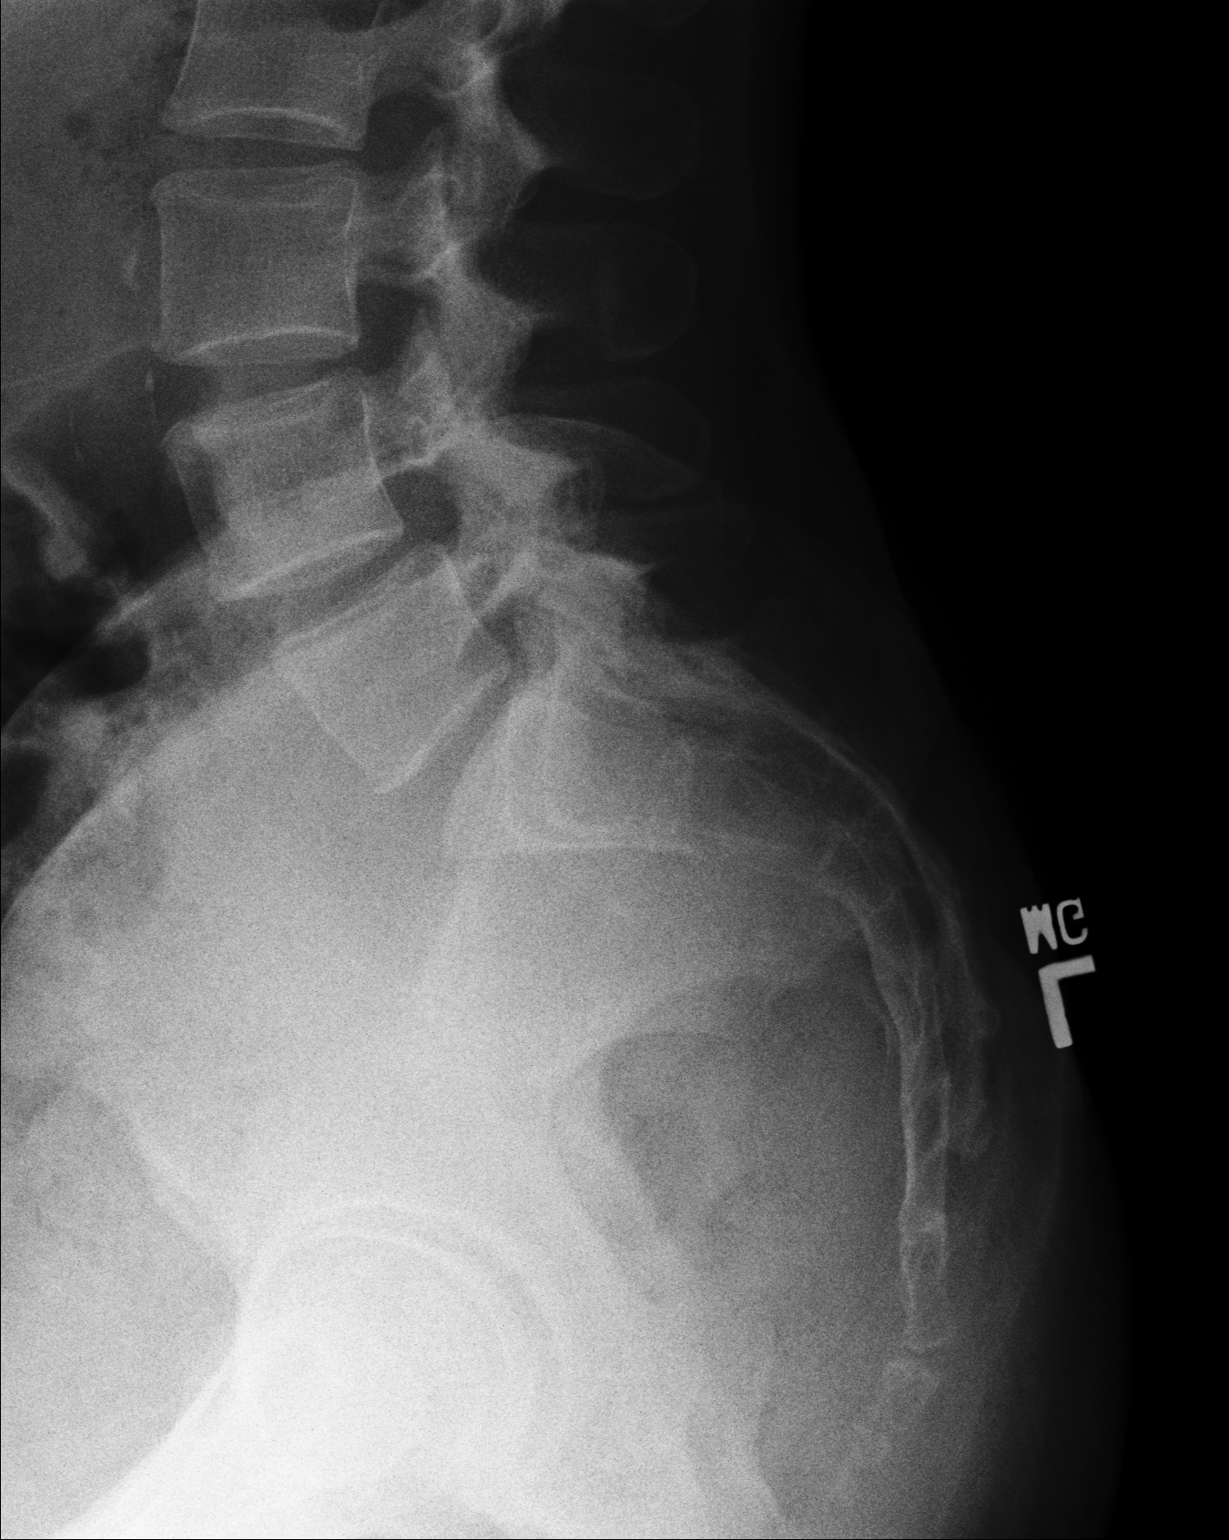

[3 of 3 positions shown; findings below may reference images not displayed]

PROCEDURE:     DXR - DXR LUMBAR SPINE AP AND LATERAL  - September 16, 2006 [DATE]

RESULT:     AP and lateral views of the lumbar spine show the vertebral body
heights and intervertebral disc spaces to be well maintained.  There is
noted a 3 mm spondylolisthesis at L4-L5.  No associated fracture is seen and
the finding would appear to most likely be on a degenerative basis. The
vertebral body alignment otherwise is normal. The pedicles are bilaterally
intact.
IMPRESSION: 1)No fracture is seen.

2)There is a slight spondylolisthesis at L4-L5 with there being no
associated spondylolysis.

## 2007-06-30 ENCOUNTER — Ambulatory Visit: Payer: Self-pay | Admitting: Gastroenterology

## 2007-07-14 ENCOUNTER — Ambulatory Visit: Payer: Self-pay | Admitting: Gynecology

## 2007-08-25 IMAGING — CR DG CHEST 1V PORT
1 series · 1 of 1 positions shown · non-contrast
Comparison: none

REASON FOR EXAM: AMS, overdose
COMMENTS:

PROCEDURE:     DXR - DXR PORTABLE CHEST SINGLE VIEW  - December 02, 2006  [DATE]
RESULT:     Comparison is made to the study of 09/03/06.
The lungs are hyperinflated. There is no focal infiltrate. The heart is
normal in size. The pulmonary vascularity is not engorged.

[view not recorded]
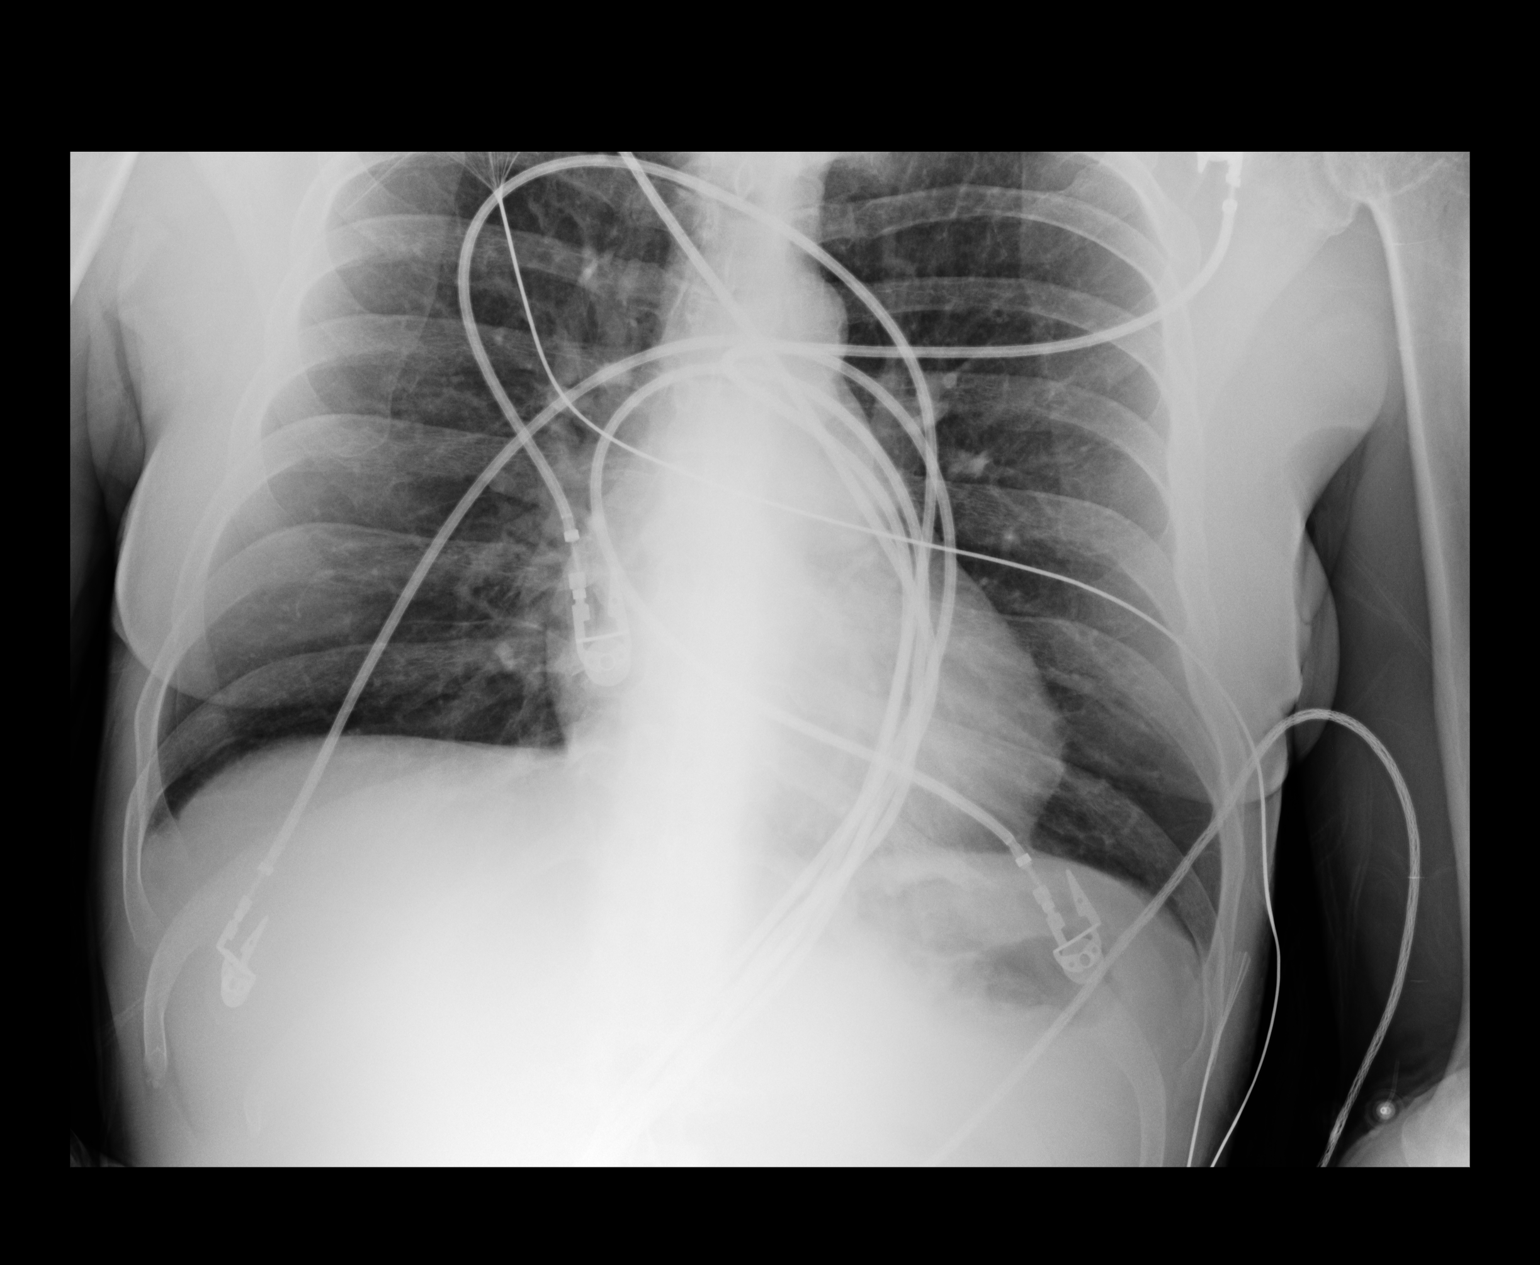

[1 of 1 positions shown; findings below may reference images not displayed]

IMPRESSION: I do not see evidence of aspiration or CHF or other
evidence of acute cardiopulmonary abnormality.

## 2007-08-25 IMAGING — CT CT HEAD WITHOUT CONTRAST
2 series · 16 of 30 positions shown, 20 images · non-contrast
Comparison: none

REASON FOR EXAM: ams
COMMENTS:

[Series 2: without · axial · non-contrast · 0.39mm/px · z∈[+734,+854]mm · 13 of 28 slices shown, 17 images]
[im 2/28  brain]
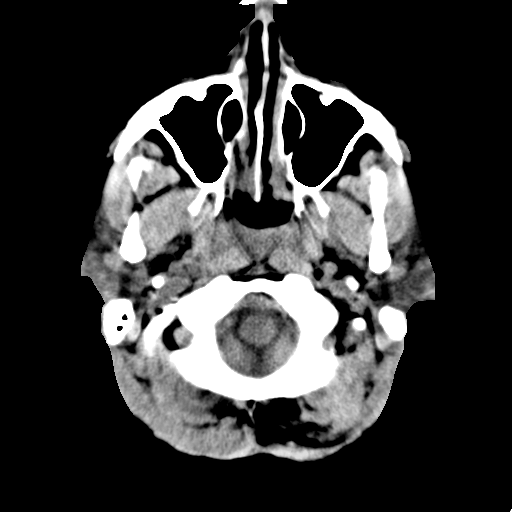
[im 2/28  bone]
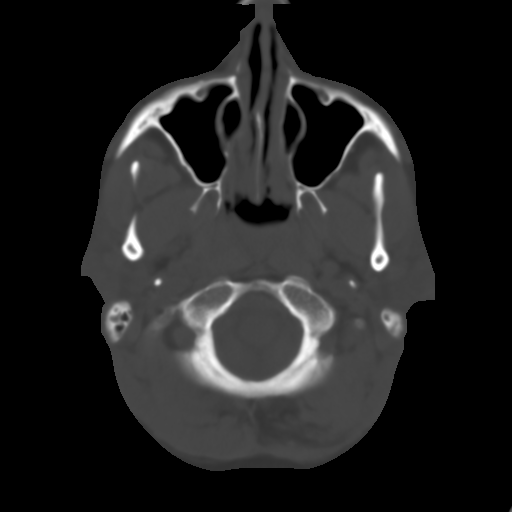
[im 4/28  brain]
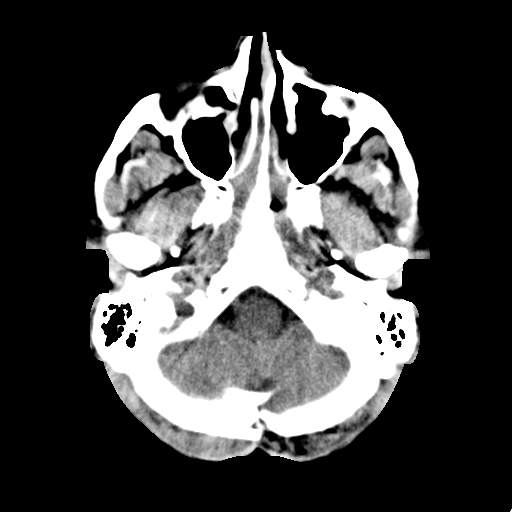
[im 6/28  brain]
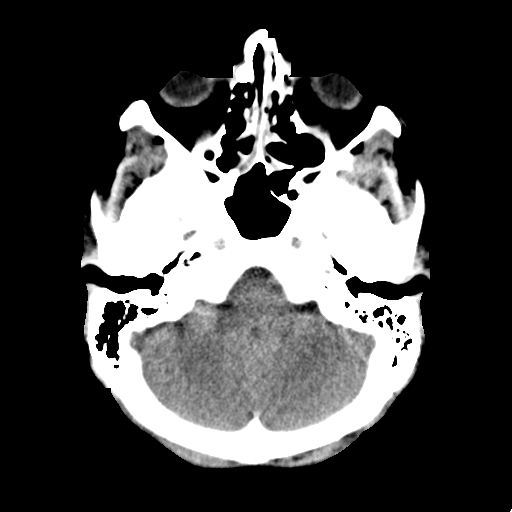
[im 8/28  brain]
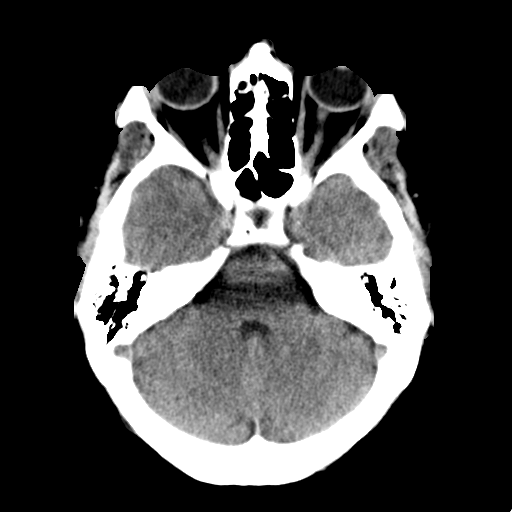
[im 10/28  brain]
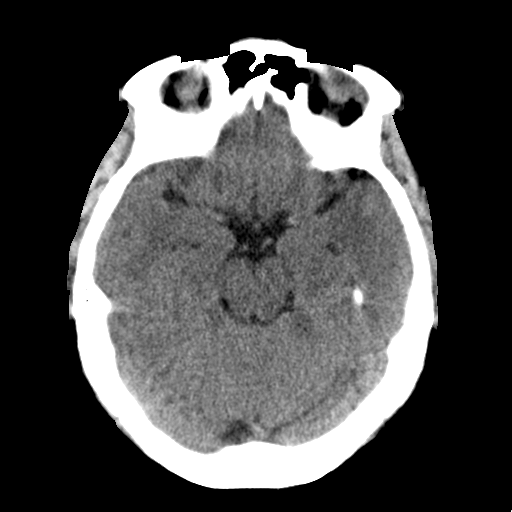
[im 10/28  bone]
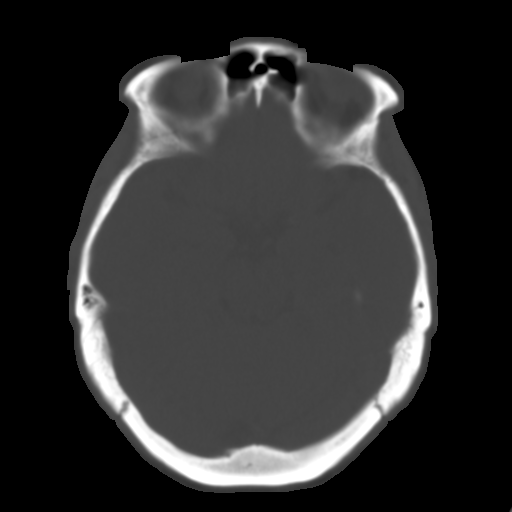
[im 12/28  brain]
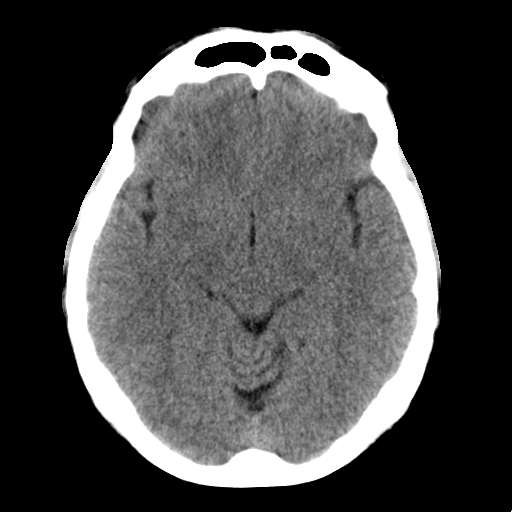
[im 14/28  brain]
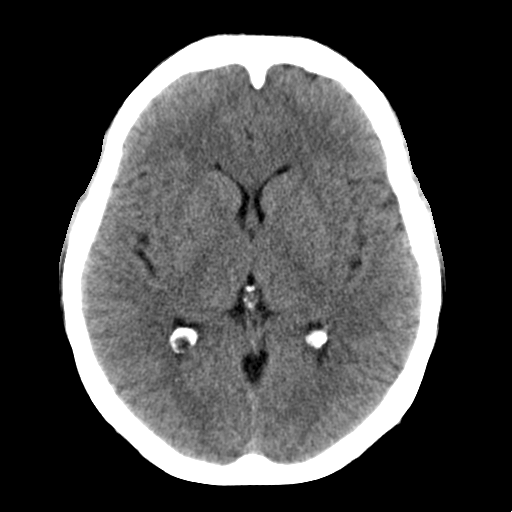
[im 16/28  brain]
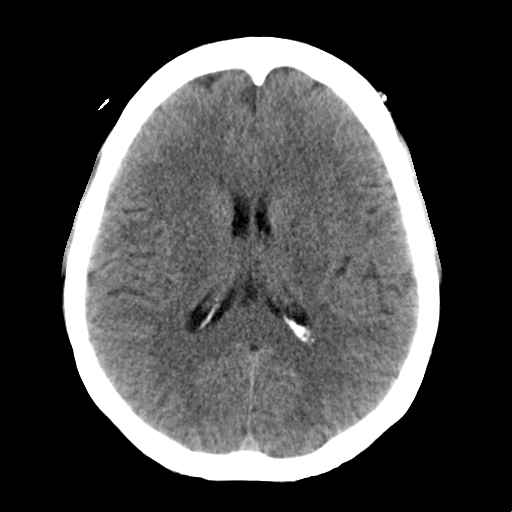
[im 18/28  brain]
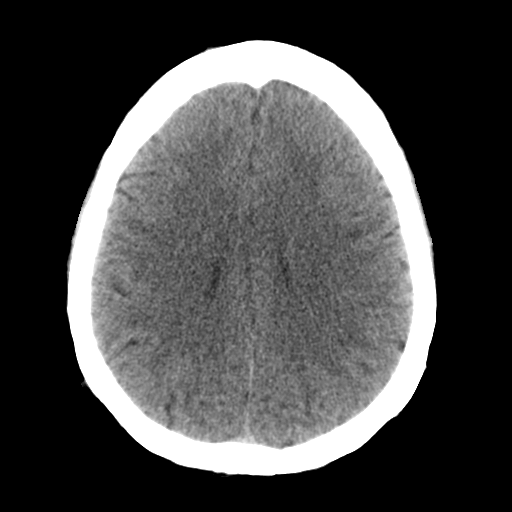
[im 18/28  bone]
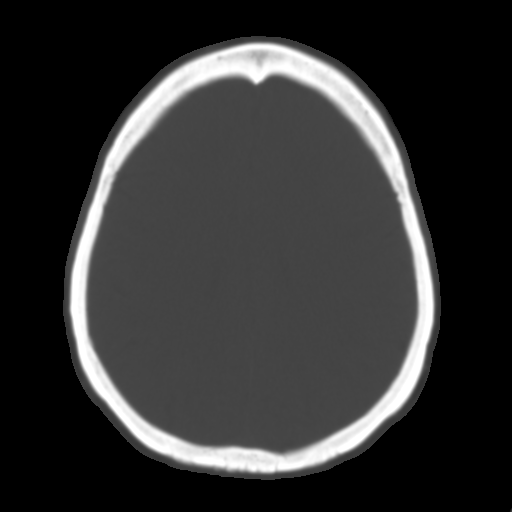
[im 20/28  brain]
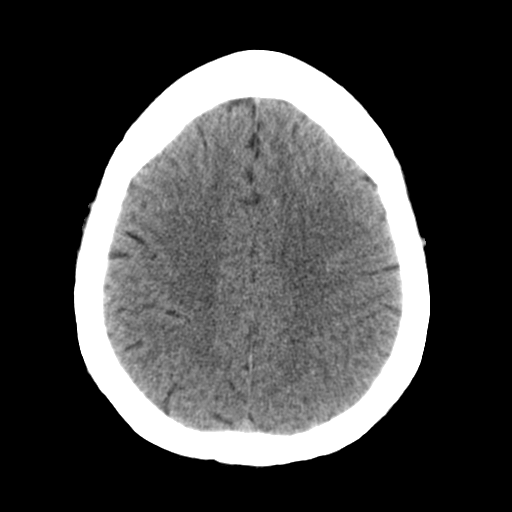
[im 22/28  brain]
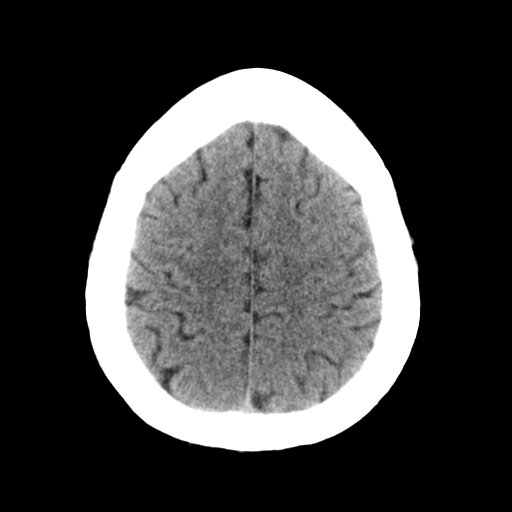
[im 24/28  brain]
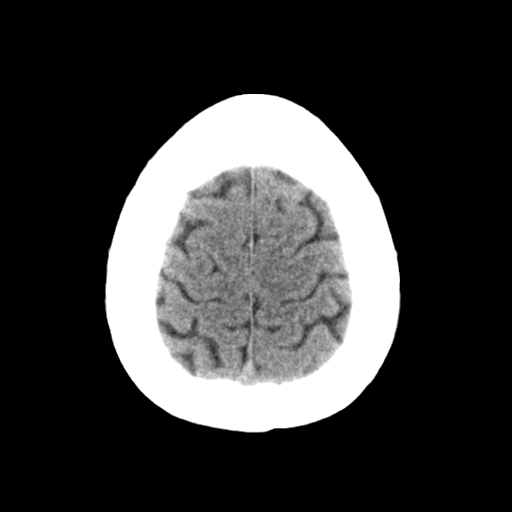
[im 26/28  brain]
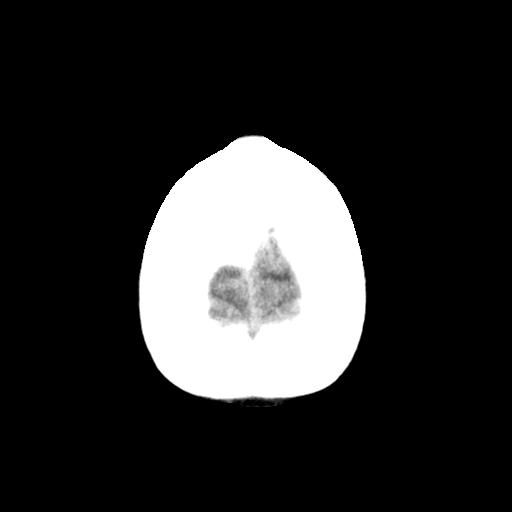
[im 26/28  bone]
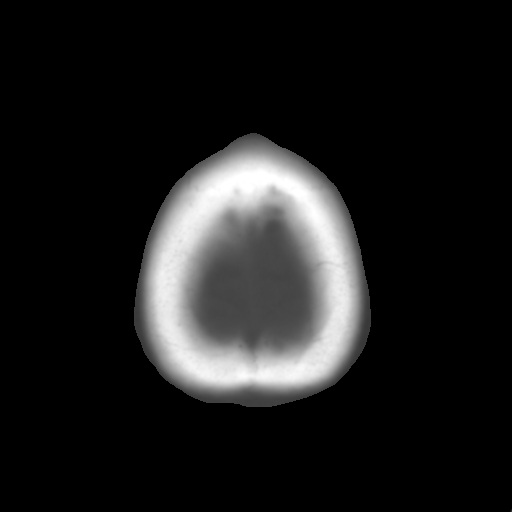

[Series 3: bone · axial · 0.39mm/px · z∈[+734,+774]mm · 3 of 28 slices shown]
[im 2/28  bone]
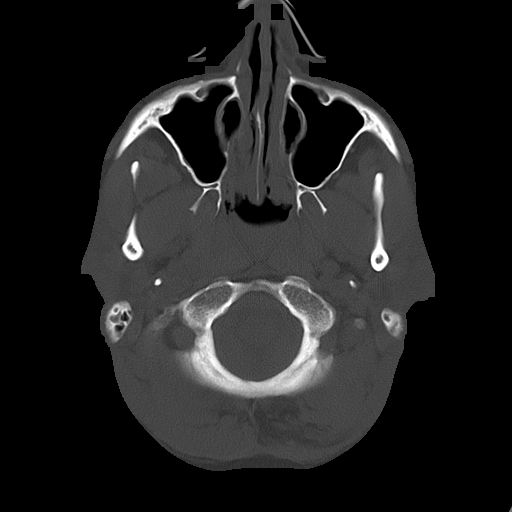
[im 6/28  bone]
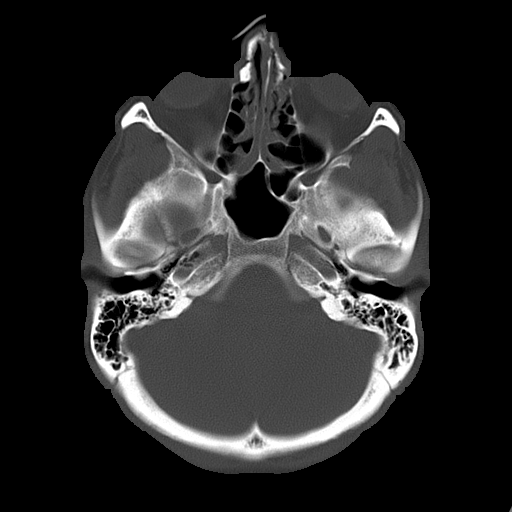
[im 10/28  bone]
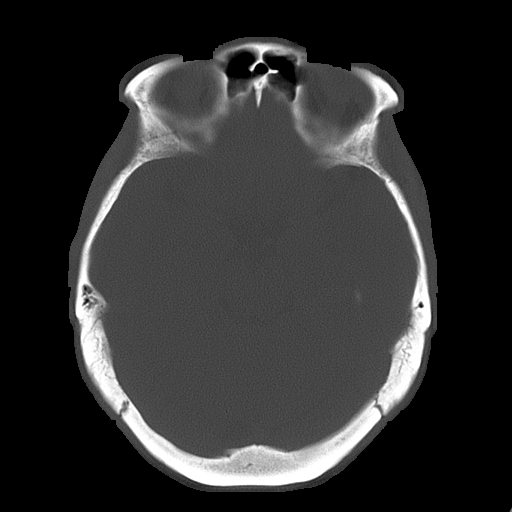

[16 of 30 positions shown; findings below may reference images not displayed]

PROCEDURE:     CT  - CT HEAD WITHOUT CONTRAST  - December 02, 2006  [DATE]

RESULT:     Emergent noncontrast CT of the brain is compared to study of
09/03/2006. The ventricles and sulci are normal. There is no hemorrhage. There
is no focal mass, mass-effect or midline shift. Is no evidence of edema or
territorial infarct. The bone windows demonstrate normal aeration of the
paranasal sinuses and mastoid air cells. There is no skull fracture
demonstrated.
IMPRESSION: 1. No acute intracranial abnormality.
2. No interval change.

## 2007-08-27 ENCOUNTER — Emergency Department (HOSPITAL_COMMUNITY): Admission: EM | Admit: 2007-08-27 | Discharge: 2007-08-27 | Payer: Self-pay | Admitting: Emergency Medicine

## 2007-09-01 HISTORY — PX: JOINT REPLACEMENT: SHX530

## 2007-09-12 ENCOUNTER — Ambulatory Visit: Payer: Self-pay | Admitting: Orthopaedic Surgery

## 2007-10-05 ENCOUNTER — Emergency Department: Payer: Self-pay

## 2007-11-19 ENCOUNTER — Emergency Department: Payer: Self-pay | Admitting: Unknown Physician Specialty

## 2007-11-28 ENCOUNTER — Ambulatory Visit: Payer: Self-pay | Admitting: General Practice

## 2007-12-02 ENCOUNTER — Inpatient Hospital Stay: Payer: Self-pay | Admitting: General Practice

## 2007-12-11 ENCOUNTER — Emergency Department: Payer: Self-pay | Admitting: Emergency Medicine

## 2007-12-29 ENCOUNTER — Emergency Department: Payer: Self-pay | Admitting: Internal Medicine

## 2007-12-29 ENCOUNTER — Emergency Department: Payer: Self-pay | Admitting: Emergency Medicine

## 2007-12-30 ENCOUNTER — Emergency Department: Payer: Self-pay | Admitting: Unknown Physician Specialty

## 2008-01-09 ENCOUNTER — Emergency Department: Payer: Self-pay | Admitting: Emergency Medicine

## 2008-03-09 ENCOUNTER — Emergency Department: Payer: Self-pay

## 2008-03-09 ENCOUNTER — Other Ambulatory Visit: Payer: Self-pay

## 2008-06-06 ENCOUNTER — Other Ambulatory Visit: Payer: Self-pay

## 2008-06-06 ENCOUNTER — Inpatient Hospital Stay: Payer: Self-pay | Admitting: Internal Medicine

## 2008-06-29 ENCOUNTER — Inpatient Hospital Stay: Payer: Self-pay | Admitting: Psychiatry

## 2008-07-15 ENCOUNTER — Emergency Department: Payer: Self-pay | Admitting: Emergency Medicine

## 2008-07-21 ENCOUNTER — Emergency Department: Payer: Self-pay | Admitting: Emergency Medicine

## 2008-08-24 IMAGING — CR DG KNEE 1-2V*R*
1 series · 2 of 2 positions shown · non-contrast
Comparison: none

REASON FOR EXAM: postop
COMMENTS:   Bedside (portable):Y

[Series 1: view not recorded · 0.17mm/px · 2 of 2 slices shown]
[im 1/2]
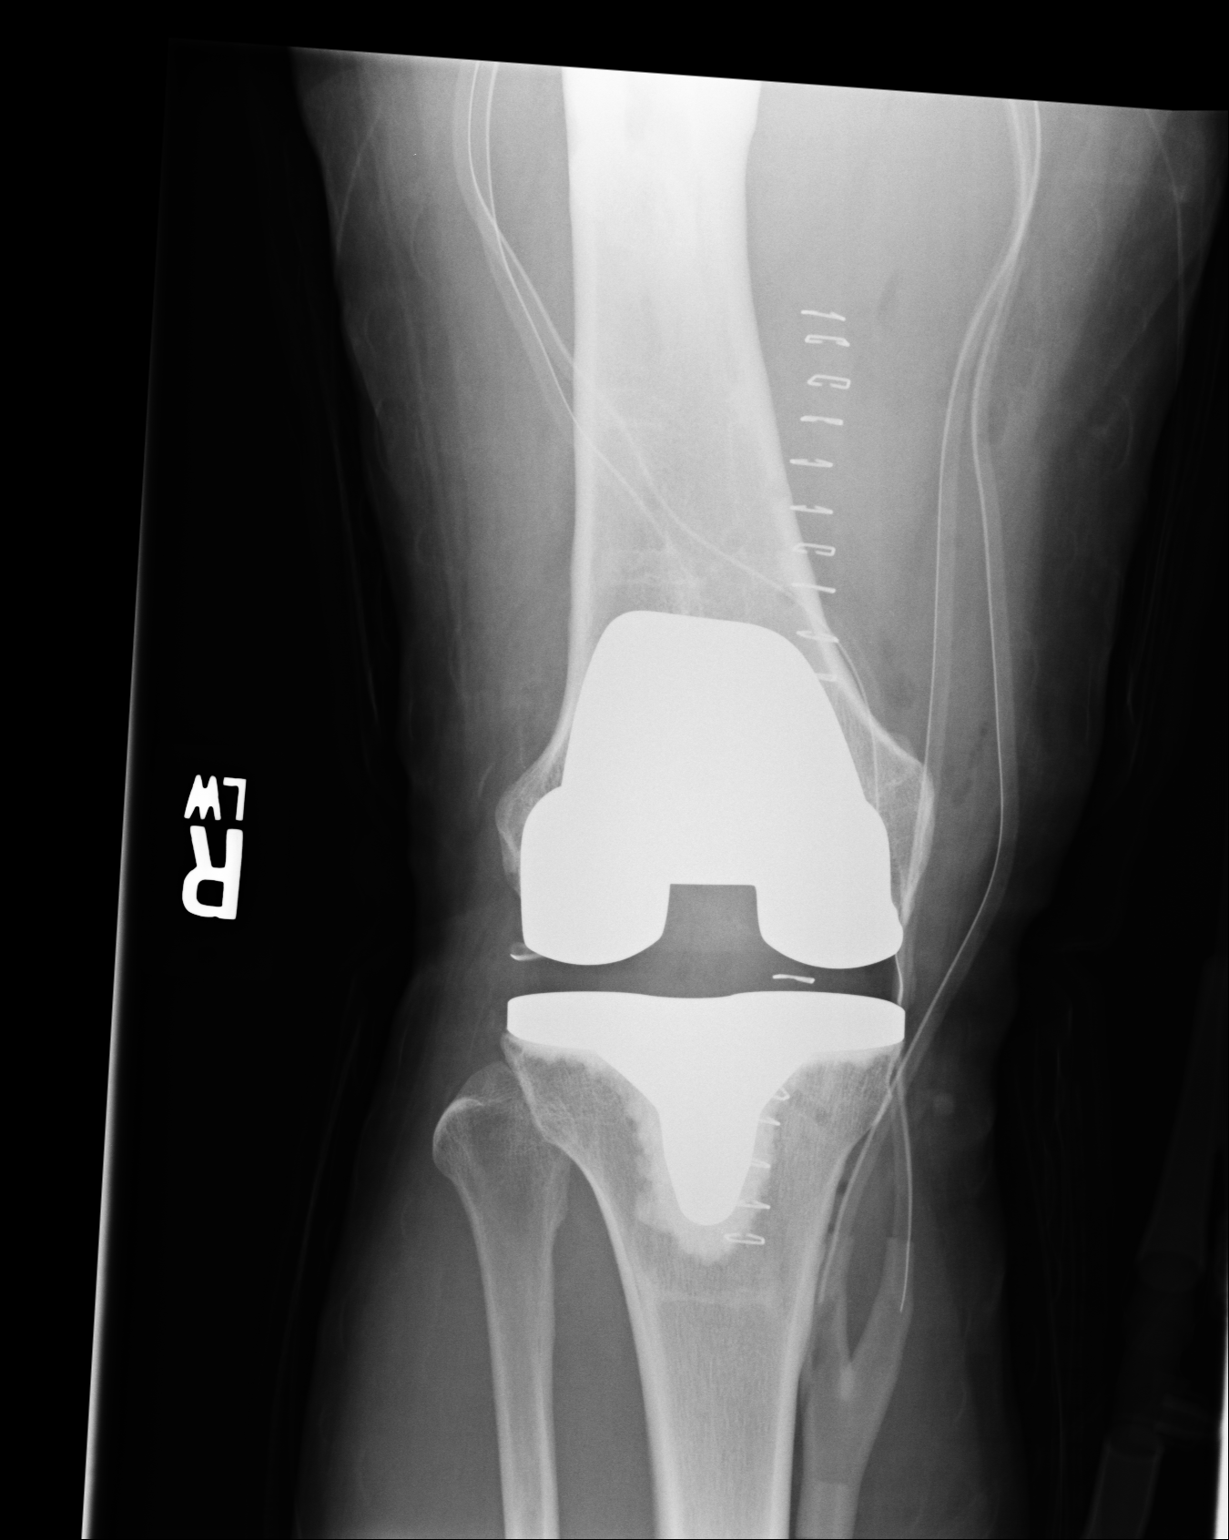
[im 2/2]
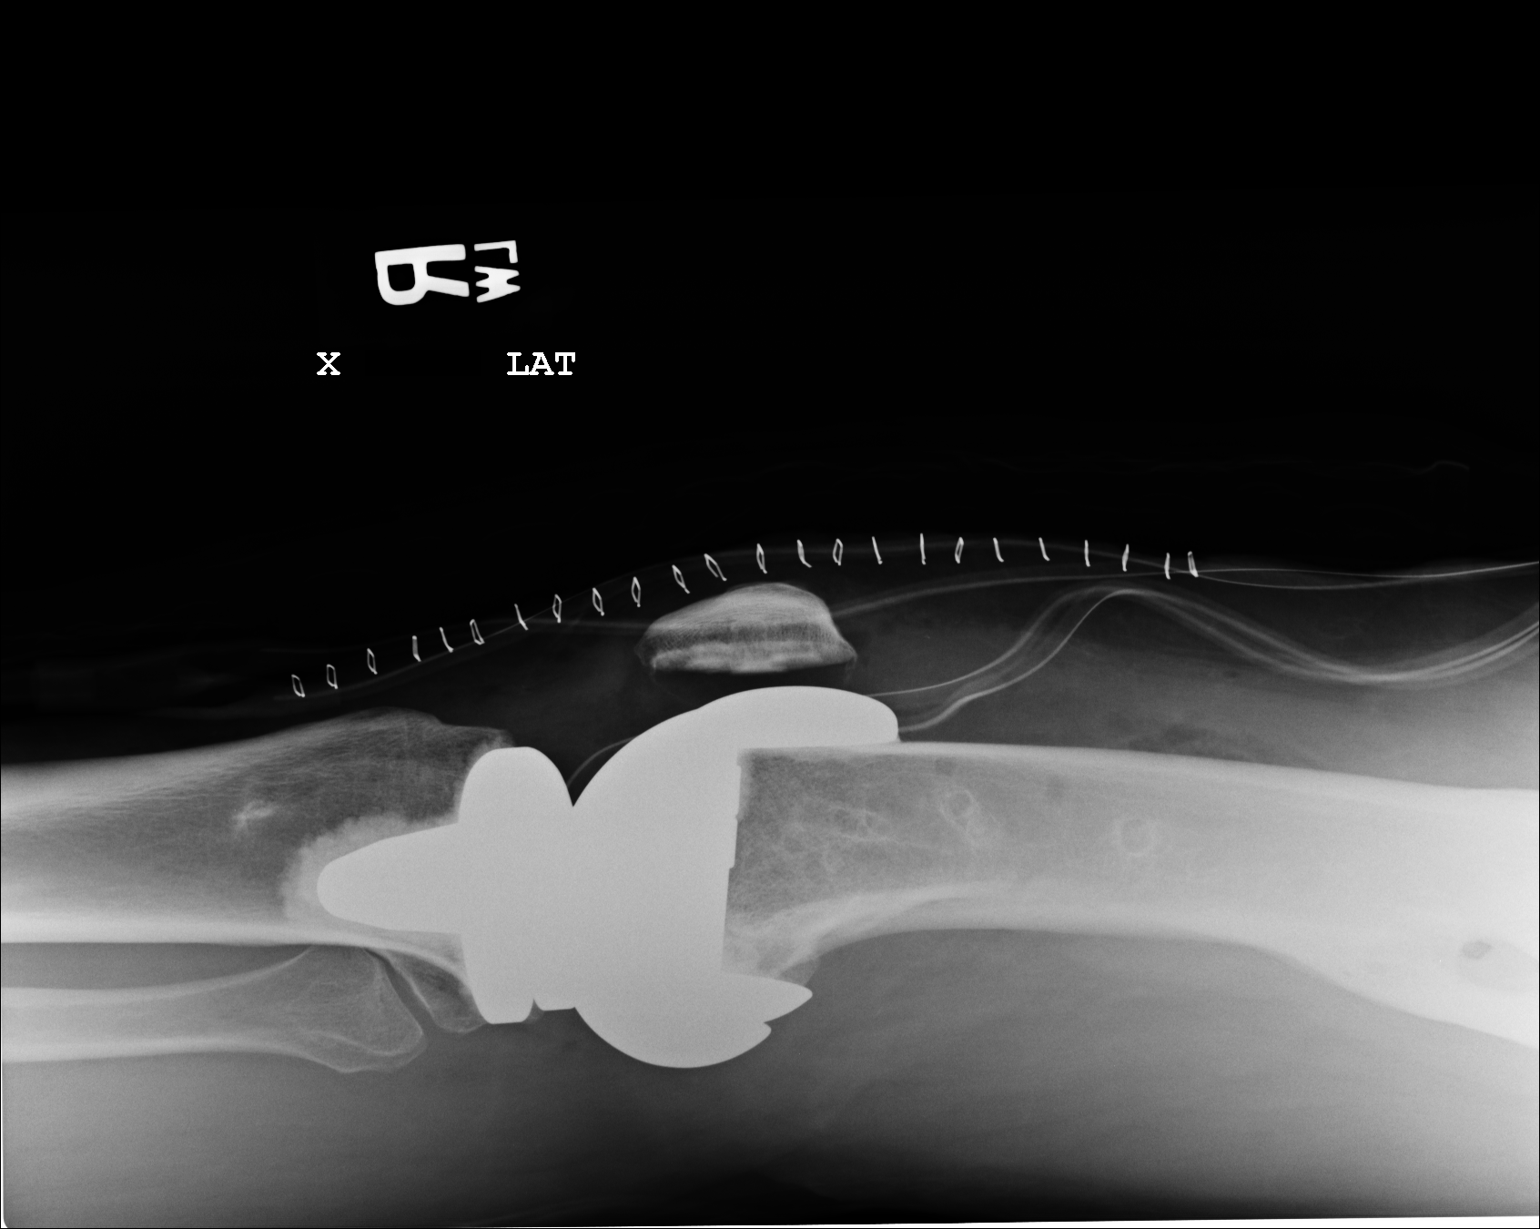

[2 of 2 positions shown; findings below may reference images not displayed]

PROCEDURE:     DXR - DXR KNEE RIGHT AP AND LATERAL  - December 02, 2007 [DATE]

RESULT:     AP and crosstable lateral views were obtained. The patient is
status post RIGHT knee replacement. No fracture about the femoral or tibial
prosthetic components is seen. There is no dislocation at the prosthetic
knee joint. Surgical drains are noted anteriorly.
IMPRESSION: 1.     The patient is status post RIGHT knee replacement. No abnormal
postoperative changes are identified.

## 2008-10-11 ENCOUNTER — Ambulatory Visit: Payer: Self-pay | Admitting: Specialist

## 2008-11-30 IMAGING — CT CT CHEST W/ CM
2 series · 15 of 31 positions shown, 19 images · IV contrast (APPLIED)
Comparison: None

REASON FOR EXAM: hypoxic and tachycardic with SOB
COMMENTS:

PROCEDURE:     CT  - CT CHEST (FOR PE) W  - March 09, 2008  [DATE]
RESULT:     CTA Pulmonary Arteries
Indications: 47-year-old female with shortness of breath
TECHNIQUE: A thin-section spiral CT from the lung apices to the upper
abdomen was acquired on a multi slice scanner following 100 ml of Hsovue-A3K
ml  intravenous contrast. Sagittal and coronal reformatted images were
performed.

[Series 4: soft tissue · axial · 0.74mm/px · z∈[-805,-757]mm · 2 of 102 slices shown]
[im 8/102  mediastinal]
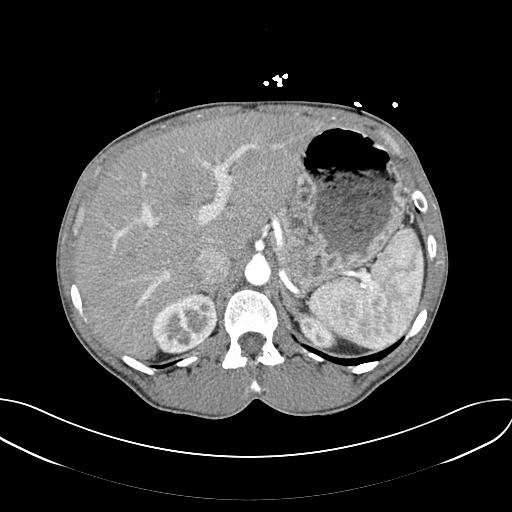
[im 24/102  mediastinal]
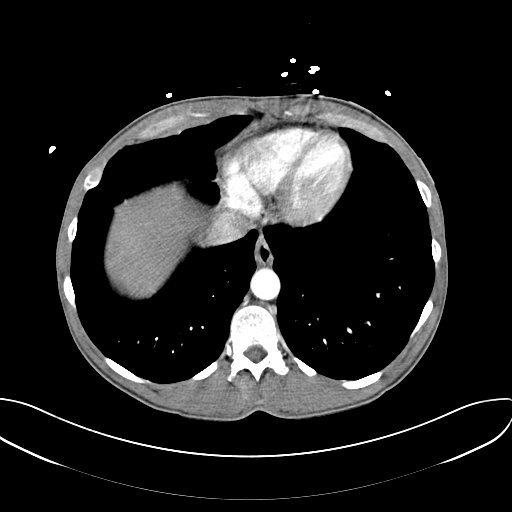

[Series 5: lung windows · axial · 0.74mm/px · z∈[-796,-547]mm · 13 of 99 slices shown, 17 images]
[im 8/99  mediastinal]
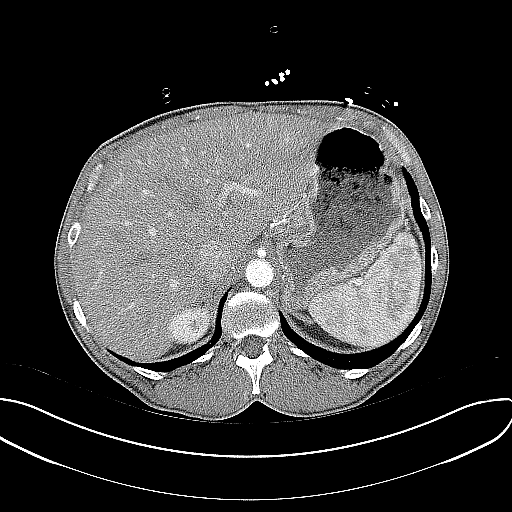
[im 8/99  lung]
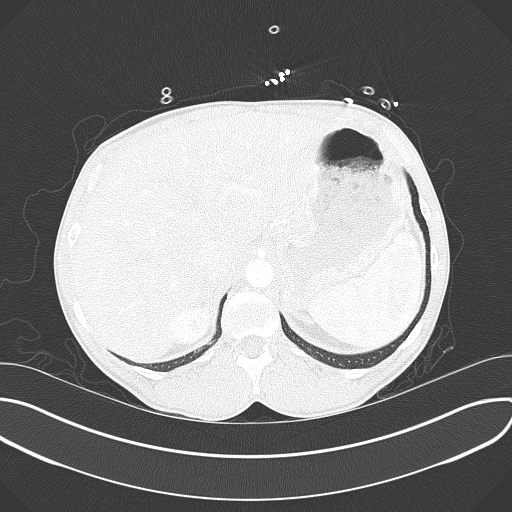
[im 16/99  lung]
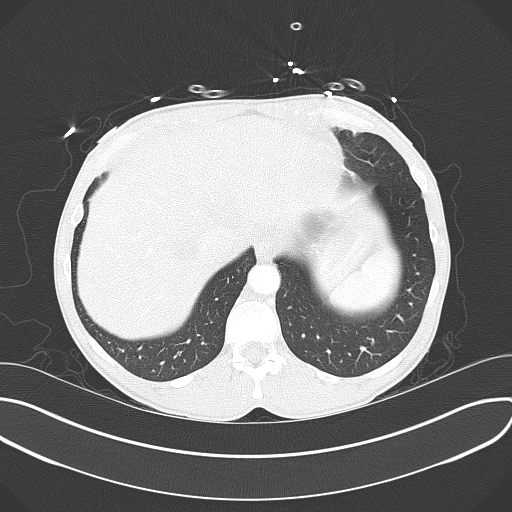
[im 23/99  lung]
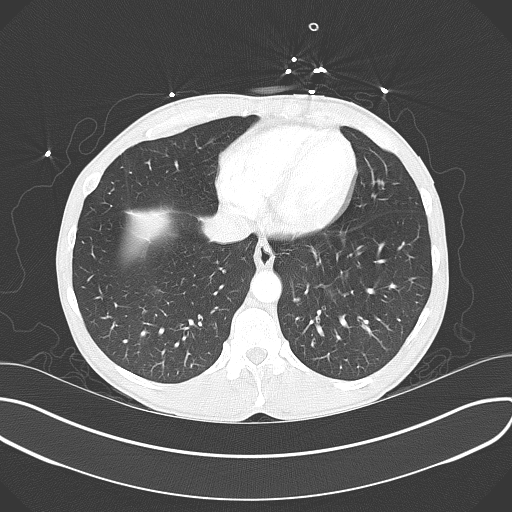
[im 31/99  lung]
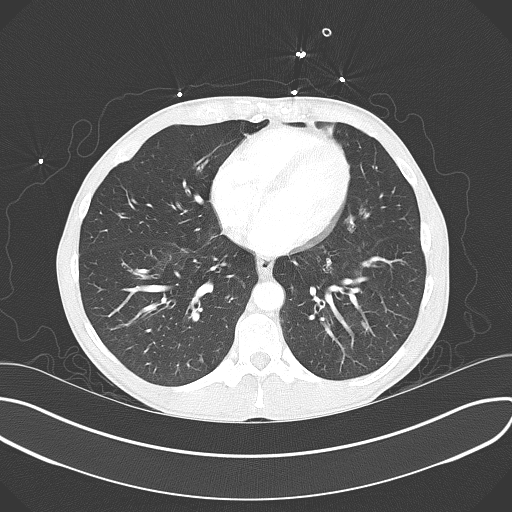
[im 38/99  mediastinal]
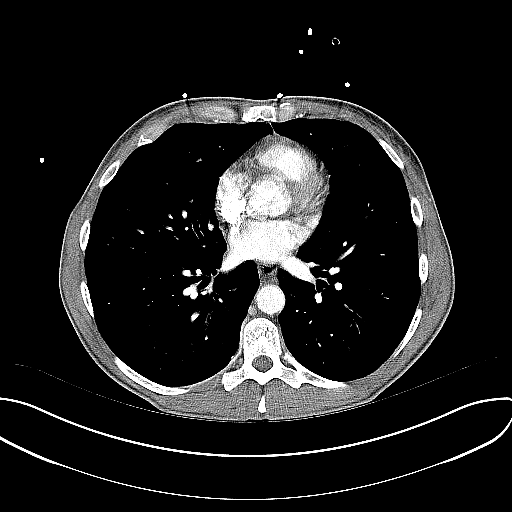
[im 38/99  lung]
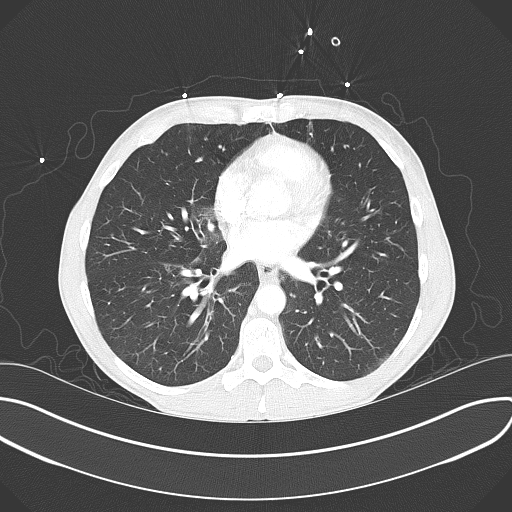
[im 46/99  lung]
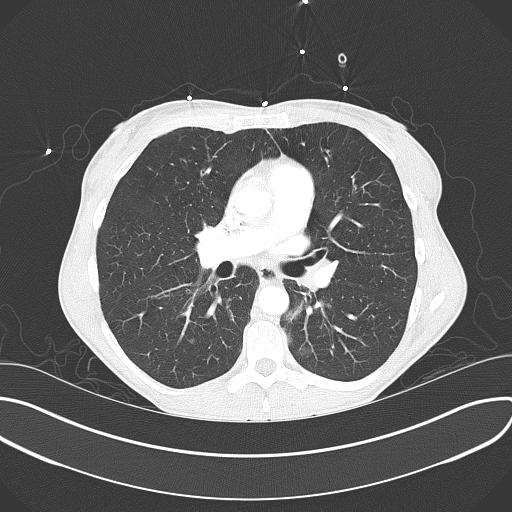
[im 50/99  lung]
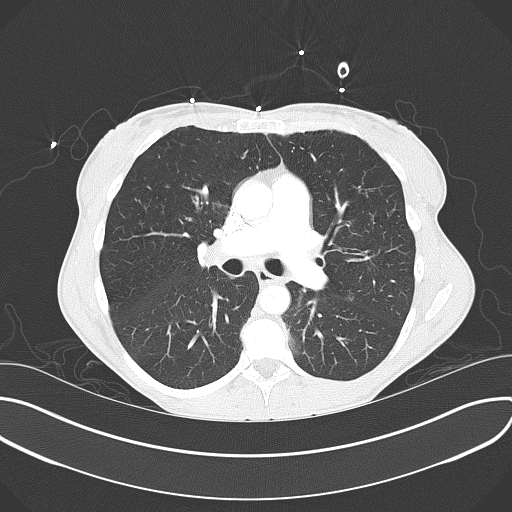
[im 53/99  lung]
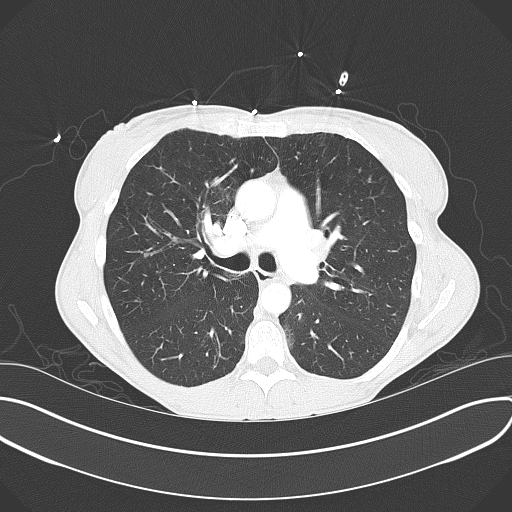
[im 61/99  mediastinal]
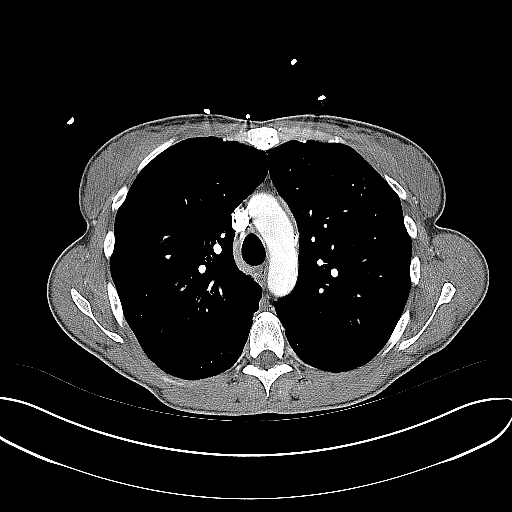
[im 61/99  lung]
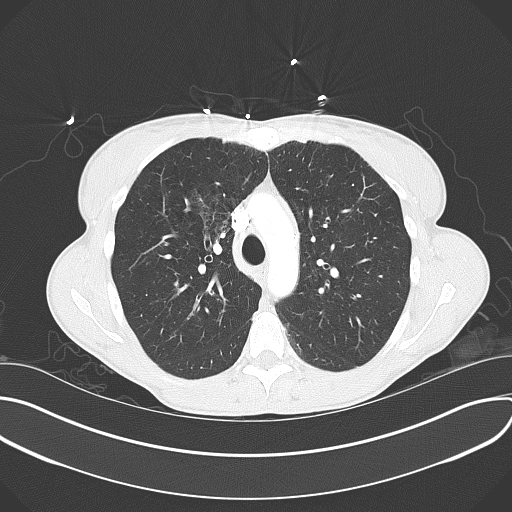
[im 68/99  lung]
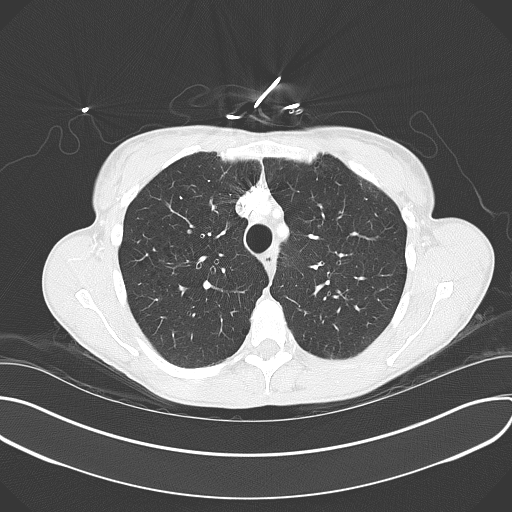
[im 76/99  lung]
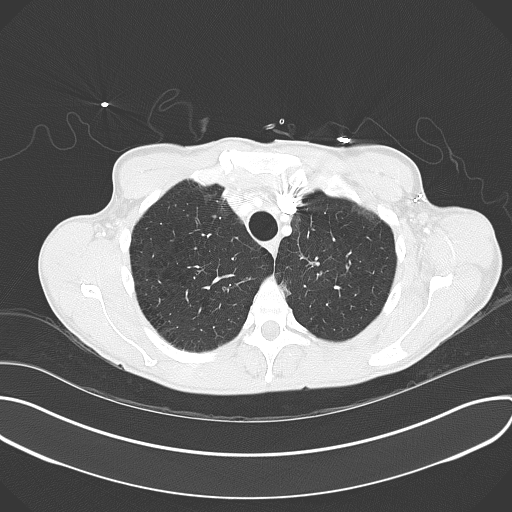
[im 83/99  lung]
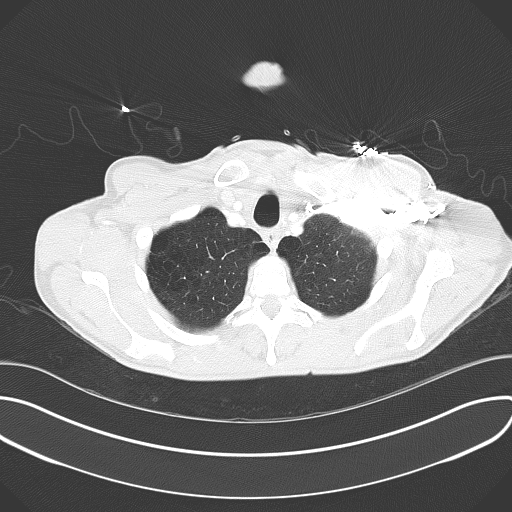
[im 91/99  mediastinal]
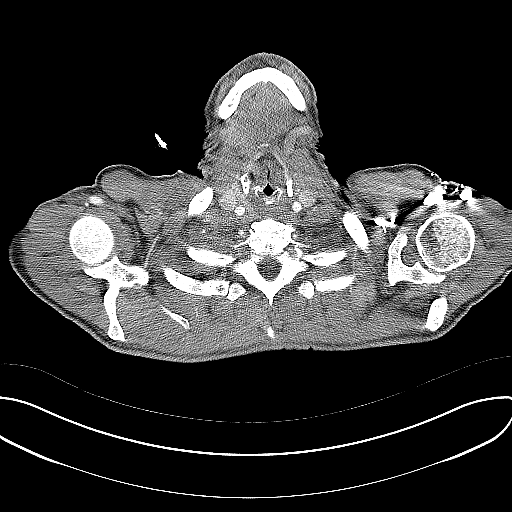
[im 91/99  lung]
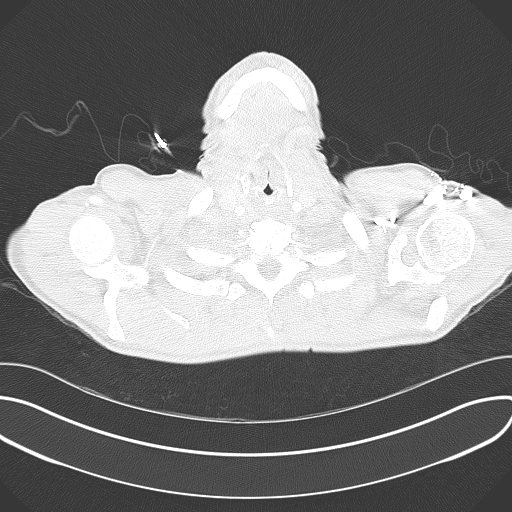

[15 of 31 positions shown; findings below may reference images not displayed]

FINDINGS: There is adequate opacification of the pulmonary arteries. There is no
pulmonary embolus. The main pulmonary artery, right main pulmonary artery,
and left main pulmonary arteries are normal in size. The heart size is
normal. There is no pericardial effusion.

 There are bilateral emphysematous changes with areas of interstitial
thickening and ground glass opacities most significant in the left lung
apex. There is a 4 mm pulmonary nodule in the left upper lobe. There is a
former-9 mm nodule in the apex of the right upper lobe. There is a second
irregularly marginated 5 mm nodule in the apex of the right upper lobe.
There is mild peribronchial thickening. There is no focal mass. There is no
pleural effusion or pneumothorax.

The osseous structures are unremarkable.

The visualized portions of the upper abdomen are unremarkable.
IMPRESSION: 1. No CT evidence of pulmonary embolus.

2. Bilateral emphysematous changes. The bilateral there is interstitial
thickening, groundglass opacities, and mild peribronchial thickening which
may represent an inflammatory or infectious process. An underlying
neoplastic process cannot be entirely excluded.
3. Bilateral pulmonary nodules as described above. Followup CT chest
recommended and 6 months.

A preliminary interpretation was provided by [[email or web address]] on 03/09/2008
at [id number] hours.

## 2008-11-30 IMAGING — CR DG CHEST 1V PORT
1 series · 1 of 1 positions shown · non-contrast
Comparison: None

REASON FOR EXAM: sob
COMMENTS:

PROCEDURE:     DXR - DXR PORTABLE CHEST SINGLE VIEW  - March 09, 2008  [DATE]
RESULT:     History: Shortness of breath

[view not recorded]
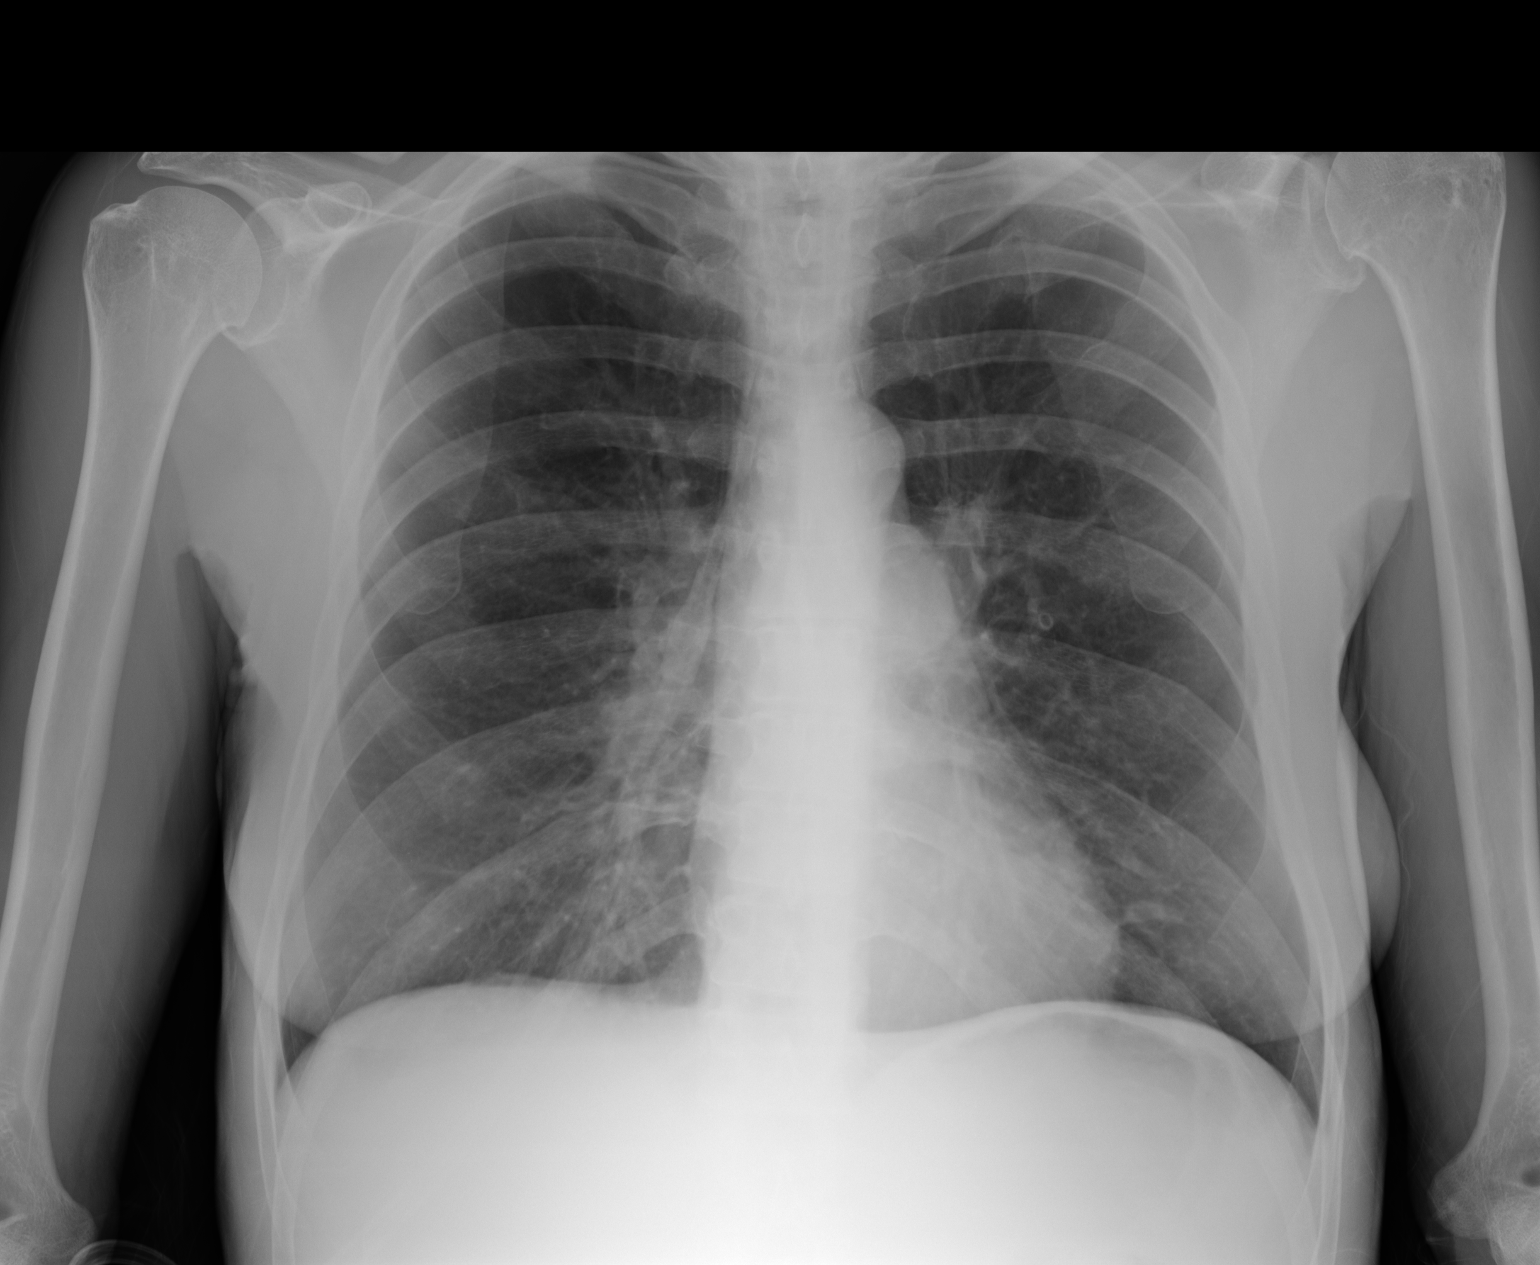

[1 of 1 positions shown; findings below may reference images not displayed]

FINDINGS: Single AP radiograph the chest is provided. There is no focal parenchymal
opacity, pleural effusion, or pneumothorax. The heart and mediastinum are
unremarkable. The osseous structures are unremarkable.
IMPRESSION: No acute disease of the chest.

## 2009-01-25 ENCOUNTER — Inpatient Hospital Stay: Payer: Self-pay | Admitting: Psychiatry

## 2009-02-01 ENCOUNTER — Emergency Department: Payer: Self-pay | Admitting: Emergency Medicine

## 2009-02-27 IMAGING — CR DG CHEST 1V PORT
1 series · 1 of 1 positions shown · non-contrast
Comparison: none

REASON FOR EXAM: dyspnea, cough
COMMENTS:

PROCEDURE:     DXR - DXR PORTABLE CHEST SINGLE VIEW  - June 06, 2008  [DATE]
RESULT:     Comparison is made to the study of 03/09/2008. The lungs appear
to be clear of infiltrate. There is no effusion. There is no pneumothorax.
There does not appear to be significant interval change.

[view not recorded]
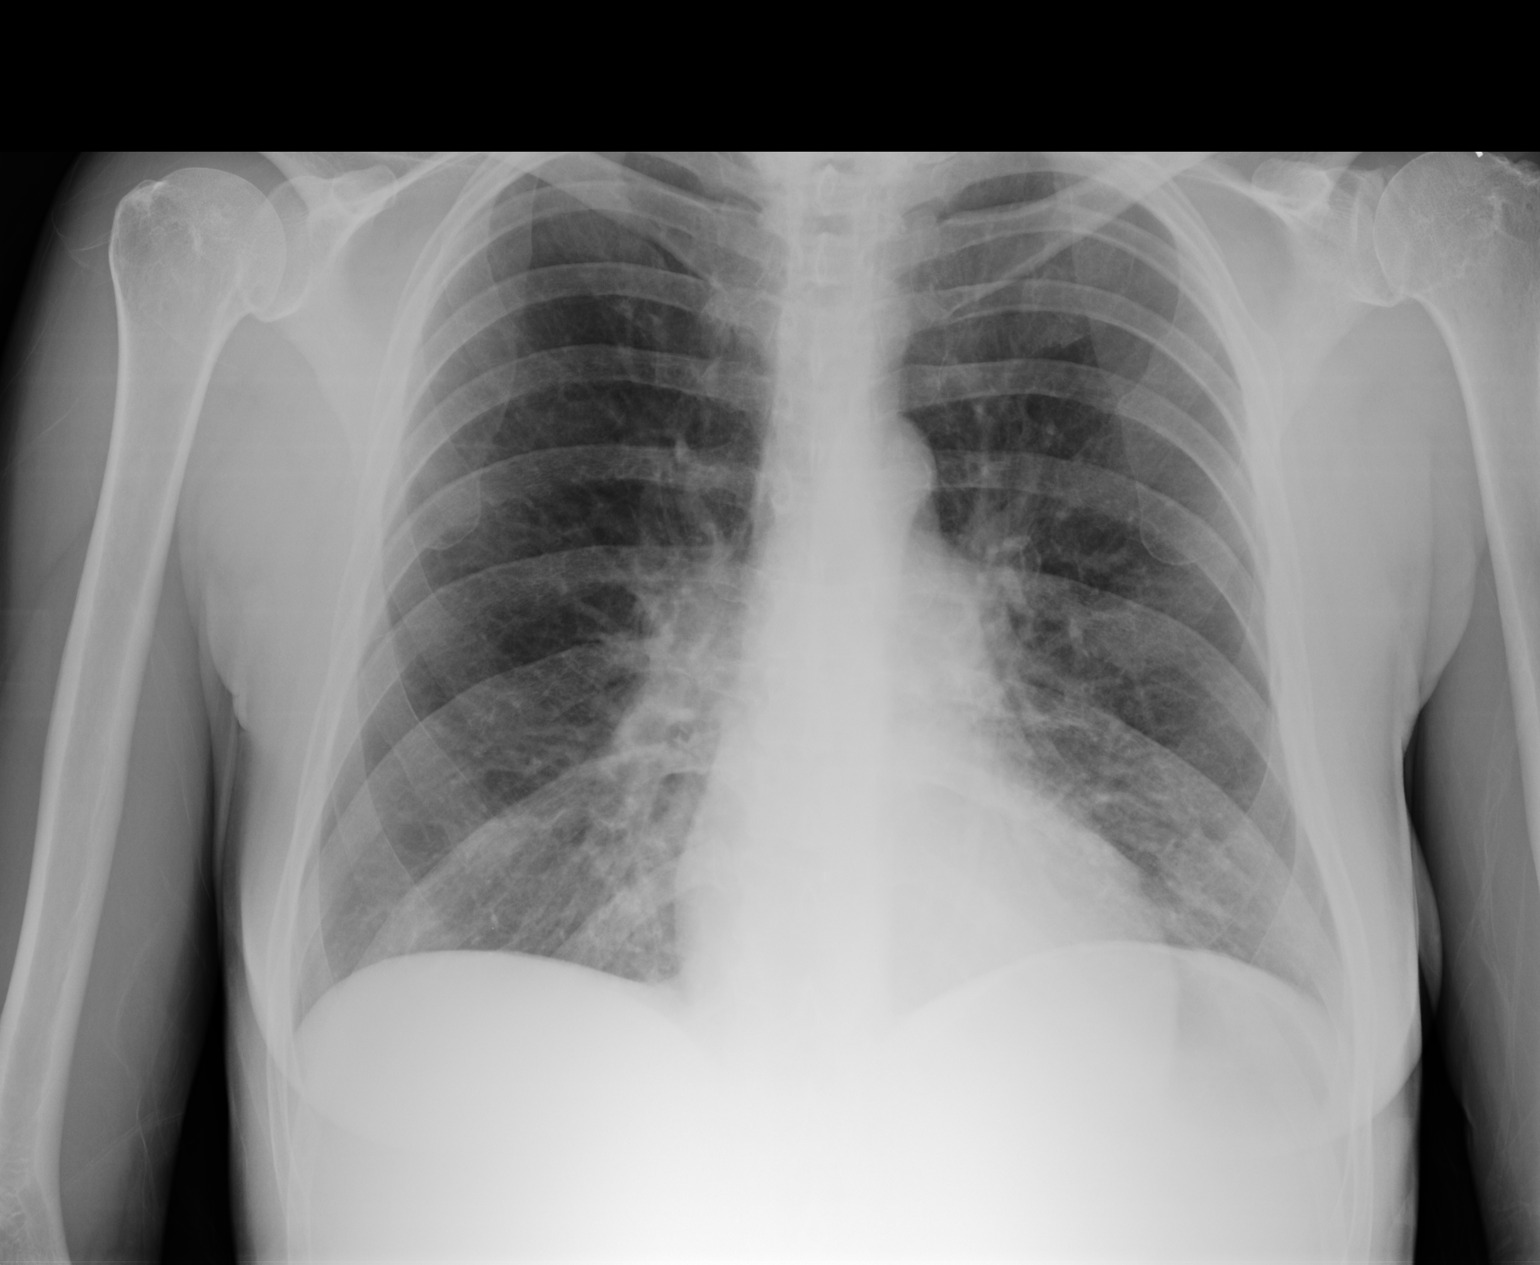

[1 of 1 positions shown; findings below may reference images not displayed]

IMPRESSION: No acute cardiopulmonary disease evident. Follow-up PA and
lateral views would be helpful.

## 2009-03-04 ENCOUNTER — Emergency Department: Payer: Self-pay | Admitting: Emergency Medicine

## 2009-04-01 ENCOUNTER — Emergency Department: Payer: Self-pay | Admitting: Emergency Medicine

## 2009-04-07 ENCOUNTER — Emergency Department: Payer: Self-pay | Admitting: Emergency Medicine

## 2009-04-08 IMAGING — CR DG CHEST 2V
1 series · 2 of 2 positions shown · non-contrast
Comparison: none

REASON FOR EXAM: cough
COMMENTS:

PROCEDURE:     DXR - DXR CHEST PA (OR AP) AND LATERAL  - July 16, 2008 [DATE]
RESULT:     Comparison is made to the prior exam of 06/06/2008. The lung
fields are clear. No pneumonia, pneumothorax or pleural effusion is seen.
There are noted multiple old rib fracture deformities on the LEFT.

[Series 1: view not recorded · 0.17mm/px · 2 of 2 slices shown]
[im 1/2]
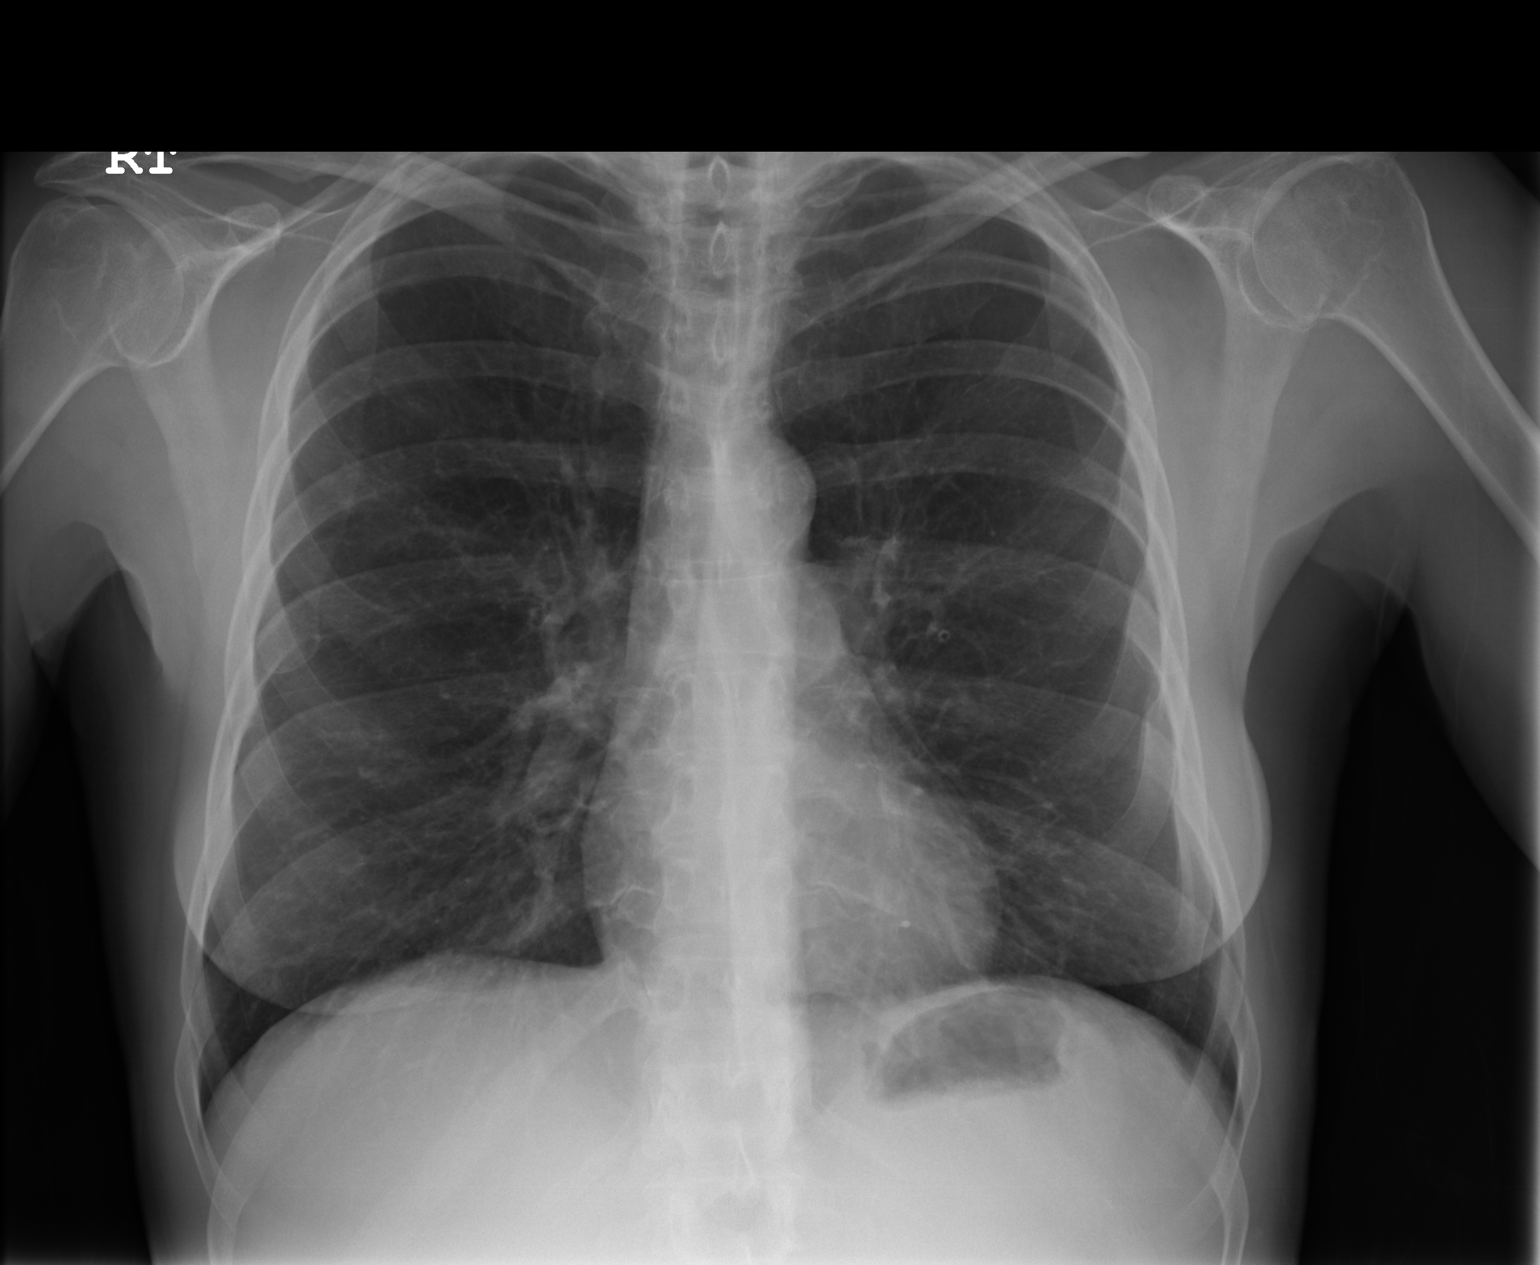
[im 2/2]
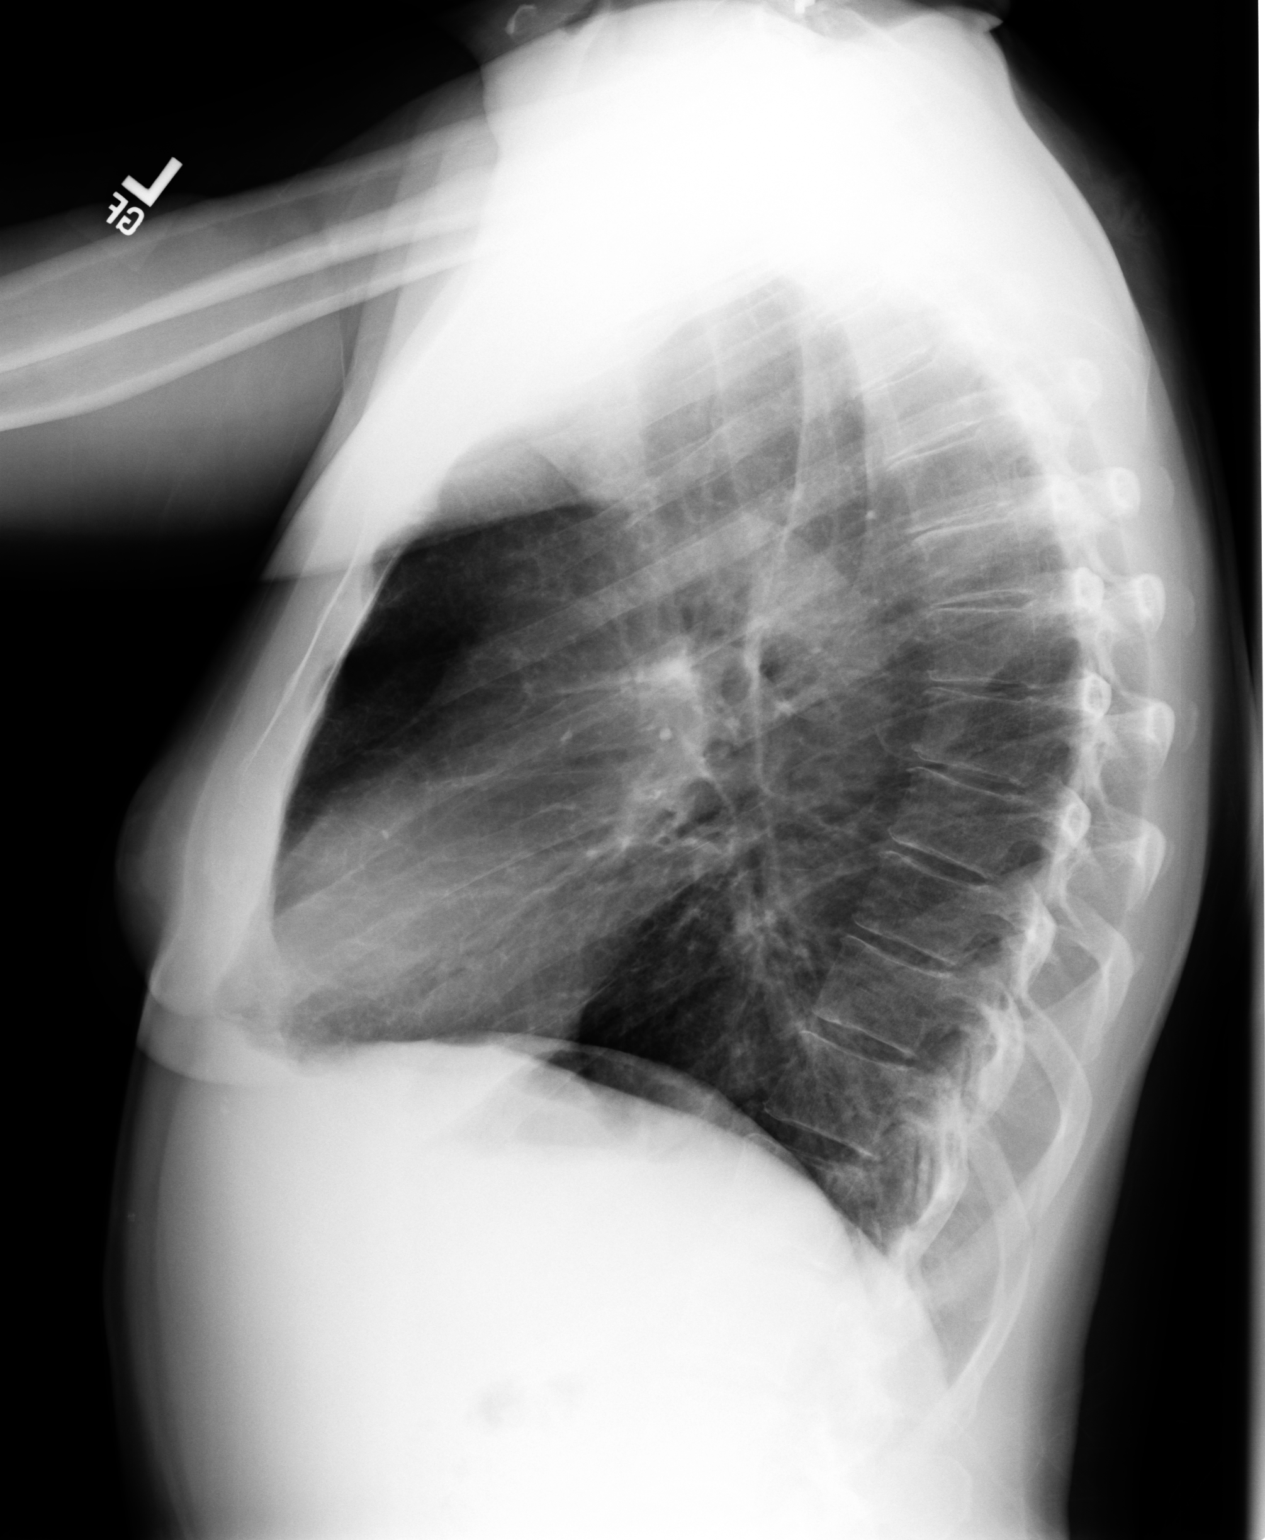

[2 of 2 positions shown; findings below may reference images not displayed]

IMPRESSION: 1.     No acute changes are identified.

## 2009-06-14 ENCOUNTER — Emergency Department: Payer: Self-pay | Admitting: Emergency Medicine

## 2009-06-14 ENCOUNTER — Ambulatory Visit: Payer: Self-pay | Admitting: Unknown Physician Specialty

## 2009-06-27 ENCOUNTER — Inpatient Hospital Stay: Payer: Self-pay | Admitting: Psychiatry

## 2009-06-27 ENCOUNTER — Ambulatory Visit: Payer: Self-pay | Admitting: Cardiology

## 2009-07-02 ENCOUNTER — Observation Stay: Payer: Self-pay | Admitting: Internal Medicine

## 2009-07-03 ENCOUNTER — Inpatient Hospital Stay: Payer: Self-pay | Admitting: Psychiatry

## 2009-07-04 IMAGING — CT CT CHEST W/ CM
1 series · 15 of 32 positions shown, 19 images · non-contrast
Comparison: none

REASON FOR EXAM: f/u pulmonary nodules
COMMENTS:

[Series 2: soft tissue · axial · 0.71mm/px · z∈[+368,+648]mm · 15 of 63 slices shown, 19 images]
[im 5/63  soft-tissue]
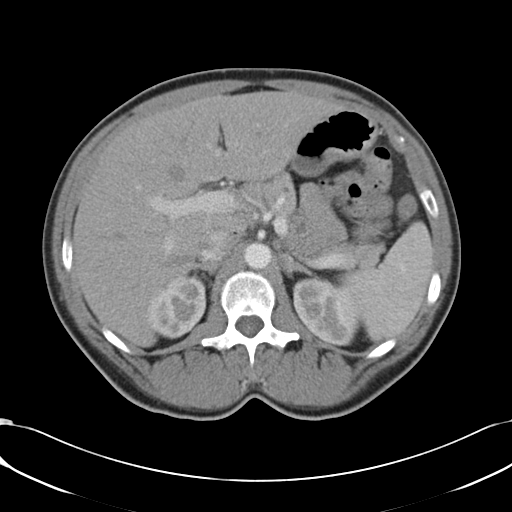
[im 5/63  bone]
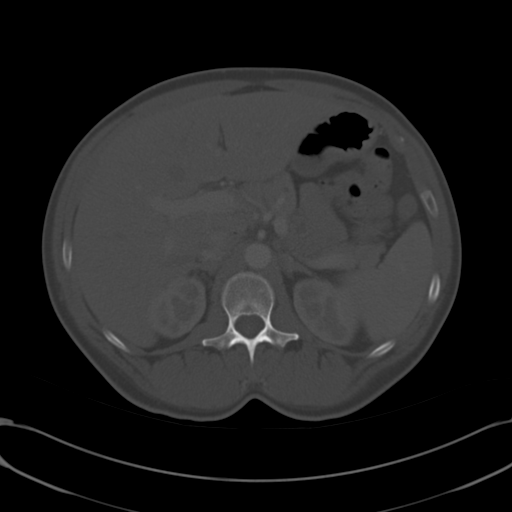
[im 9/63  soft-tissue]
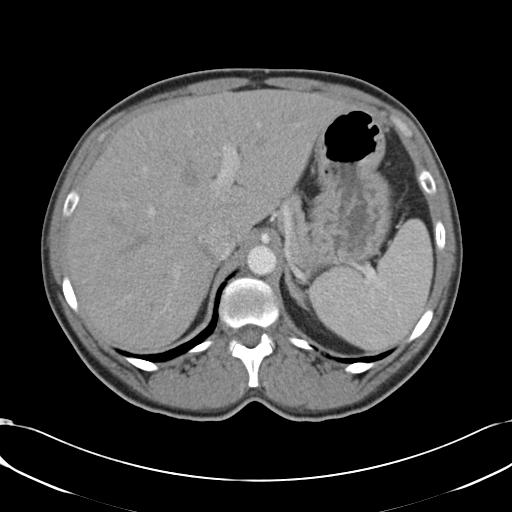
[im 13/63  soft-tissue]
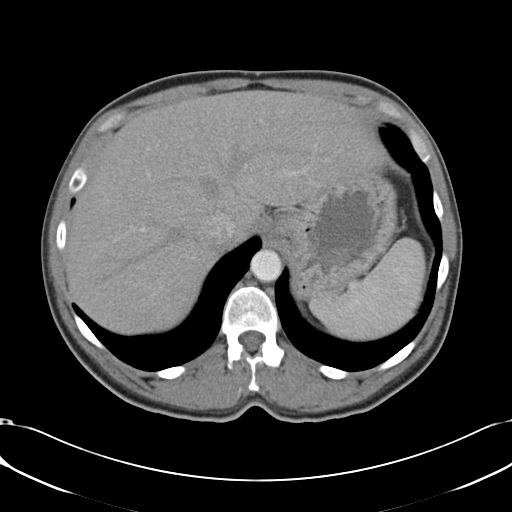
[im 19/63  soft-tissue]
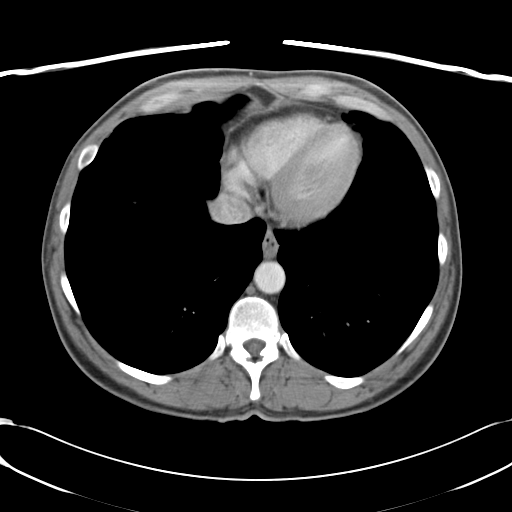
[im 23/63  soft-tissue]
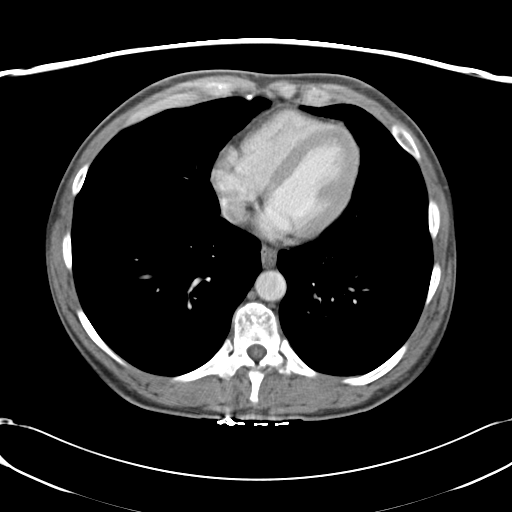
[im 27/63  soft-tissue]
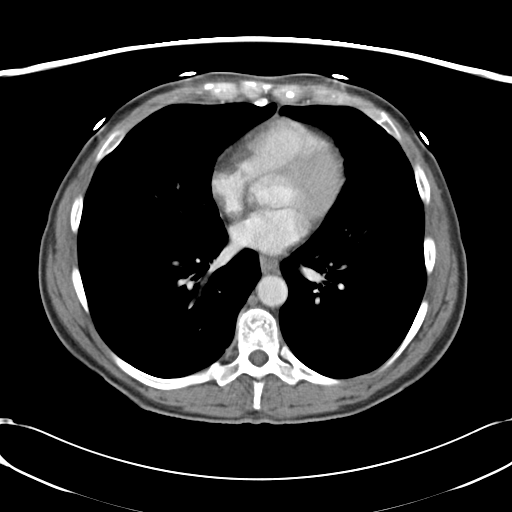
[im 33/63  soft-tissue]
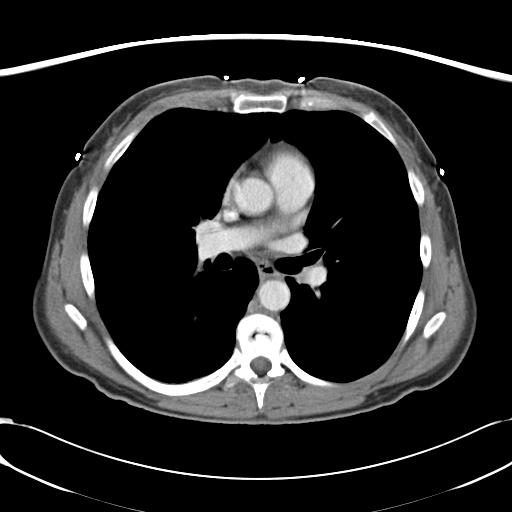
[im 37/63  soft-tissue]
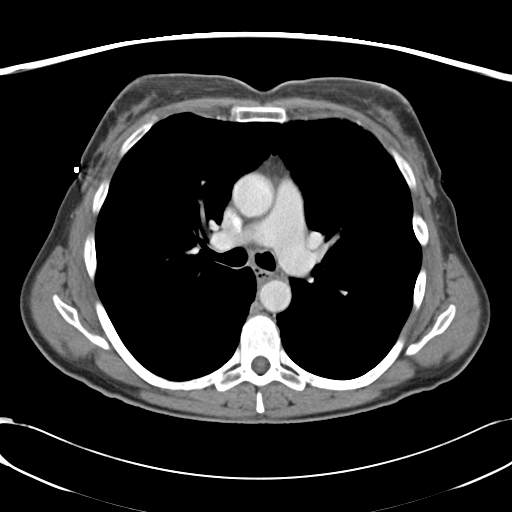
[im 41/63  soft-tissue]
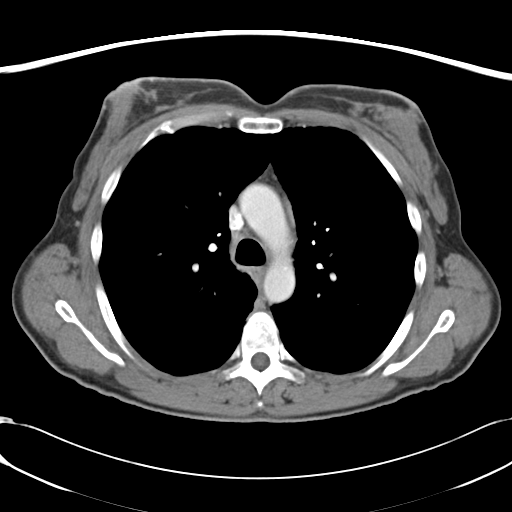
[im 41/63  bone]
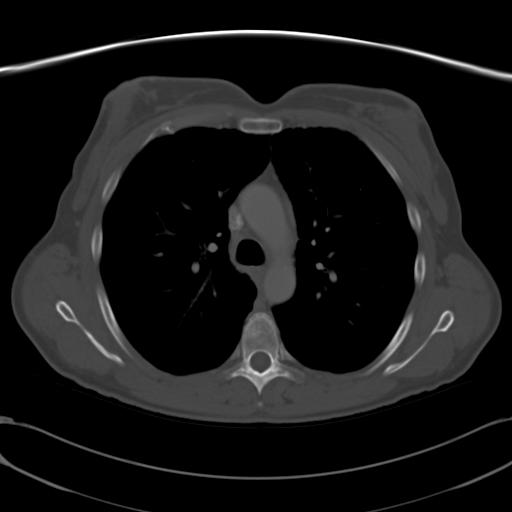
[im 45/63  soft-tissue]
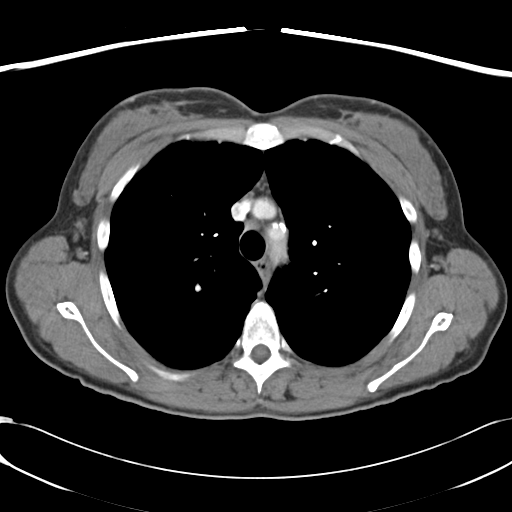
[im 51/63  soft-tissue]
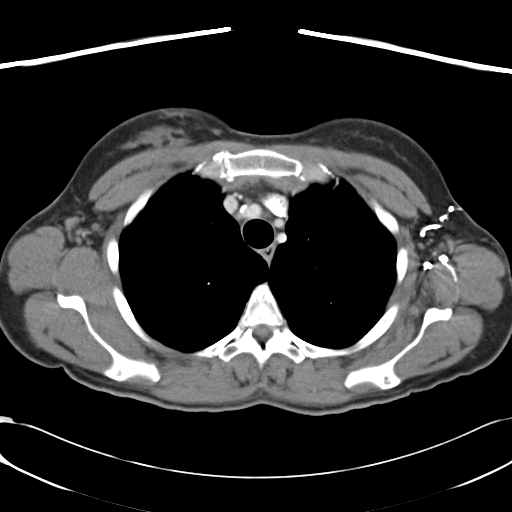
[im 55/63  soft-tissue]
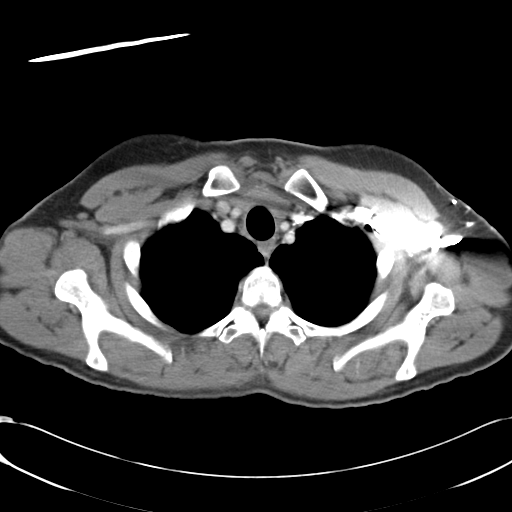
[im 55/63  lung]
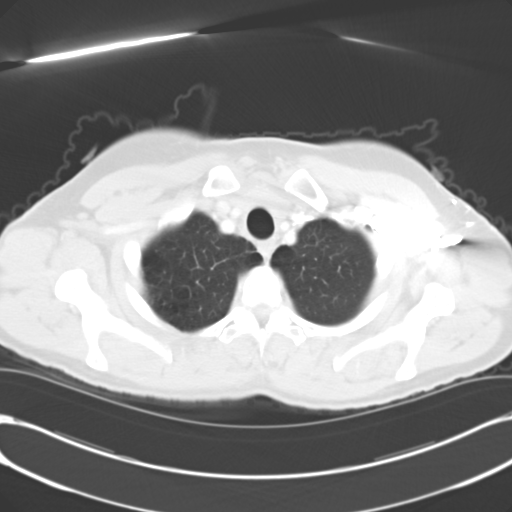
[im 57/63  lung]
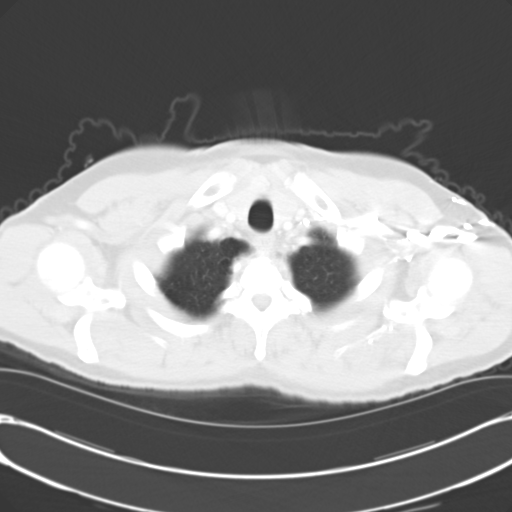
[im 59/63  soft-tissue]
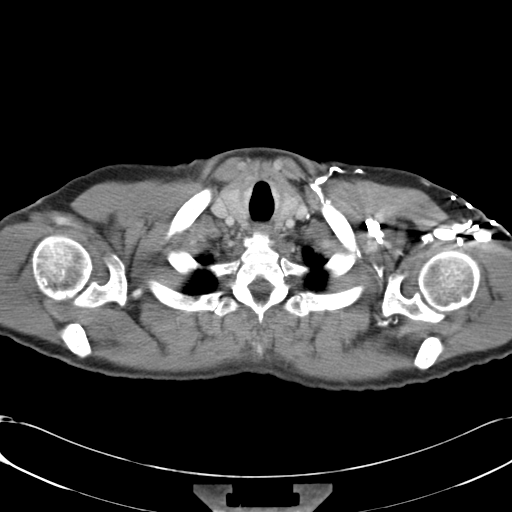
[im 59/63  lung]
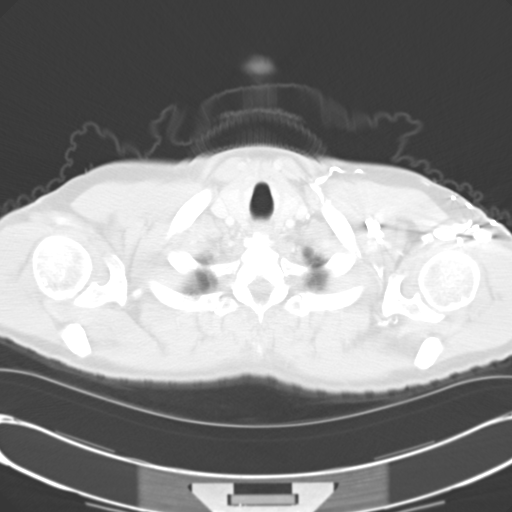
[im 61/63  lung]
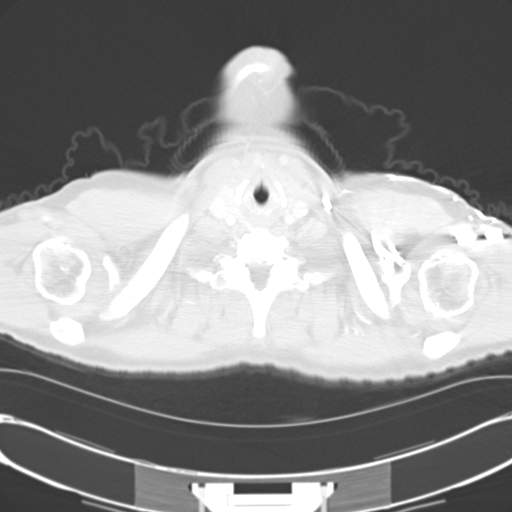

[15 of 32 positions shown; findings below may reference images not displayed]

PROCEDURE:     CT  - CT CHEST WITH CONTRAST  - October 11, 2008  [DATE]

RESULT:     Axial imaging was performed at 5 millimeter intervals and slice
thicknesses through the thorax. Images were acquired in the axial plane at
soft tissue and lung windows following administration of 75 cc of
Xsovue-ITF. Review of 3 dimensional reconstructed images was performed
separately on the [employer] monitor. Comparison is made to a study 09 March, 2008.

 The cardiac chambers are normal in size. The caliber of the thoracic aorta
is normal. I see no pathologic sized mediastinal or hilar lymph nodes.
Contrast within the central pulmonary arterial tree is normal.

At lung window settings there are emphysematous changes bilaterally. There
is no evidence of interstitial nor alveolar infiltrate. No abnormal
pulmonary parenchymal nodules are identified.

The thoracic vertebral bodies appear preserved in height. There is mild
superior endplate depression of a midthoracic vertebral body which is likely
related to a Schmorl's node. Within the upper abdomen the observed portions
of the liver appear normal. I see no adrenal masses. The partially imaged
spleen is normal in appearance.

When compared to the prior study the appearance of the lungs has not
significantly changed.
IMPRESSION: 1. There are emphysematous changes within the lungs. I see no pulmonary
parenchymal masses nor pleural based masses. No abnormal interstitial
densities are identified on the current exam.
2. There is no evidence of mediastinal nor hilar lymphadenopathy.

## 2009-10-25 IMAGING — CR RIGHT ANKLE - COMPLETE 3+ VIEW
1 series · 5 of 5 positions shown · non-contrast
Comparison: none

REASON FOR EXAM: fall, swelling RME 1
COMMENTS:

[Series 1: view not recorded · 0.17mm/px · 5 of 5 slices shown]
[im 1/5]
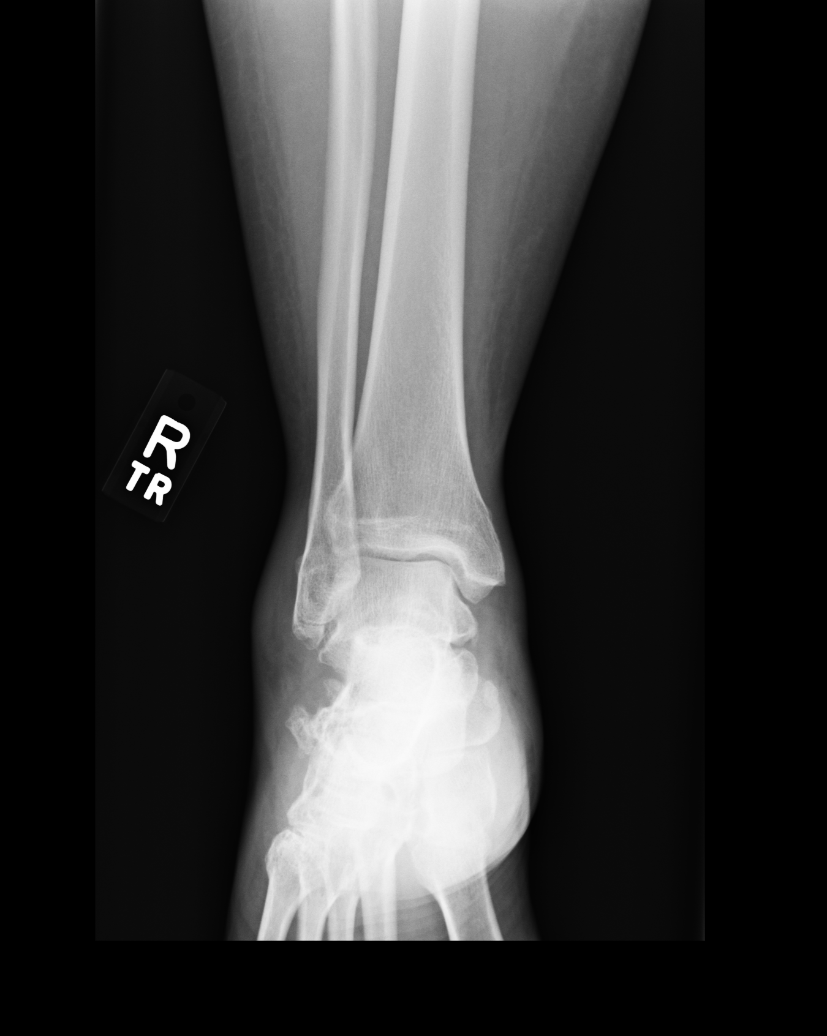
[im 2/5]
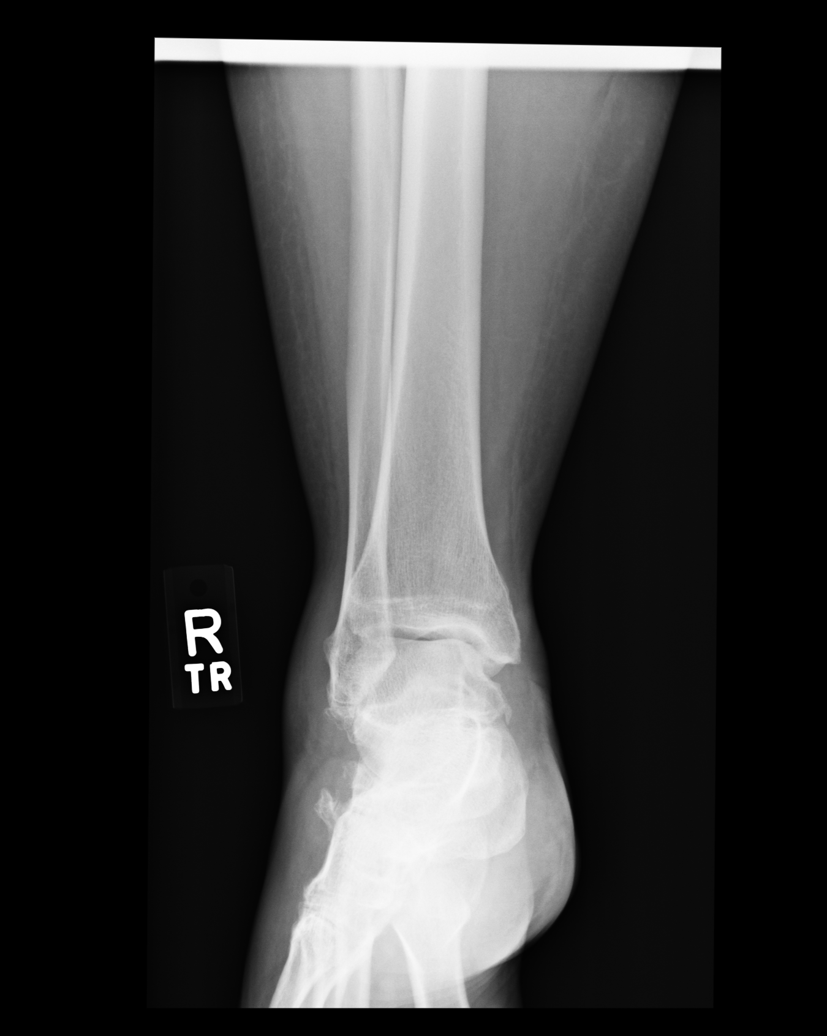
[im 3/5]
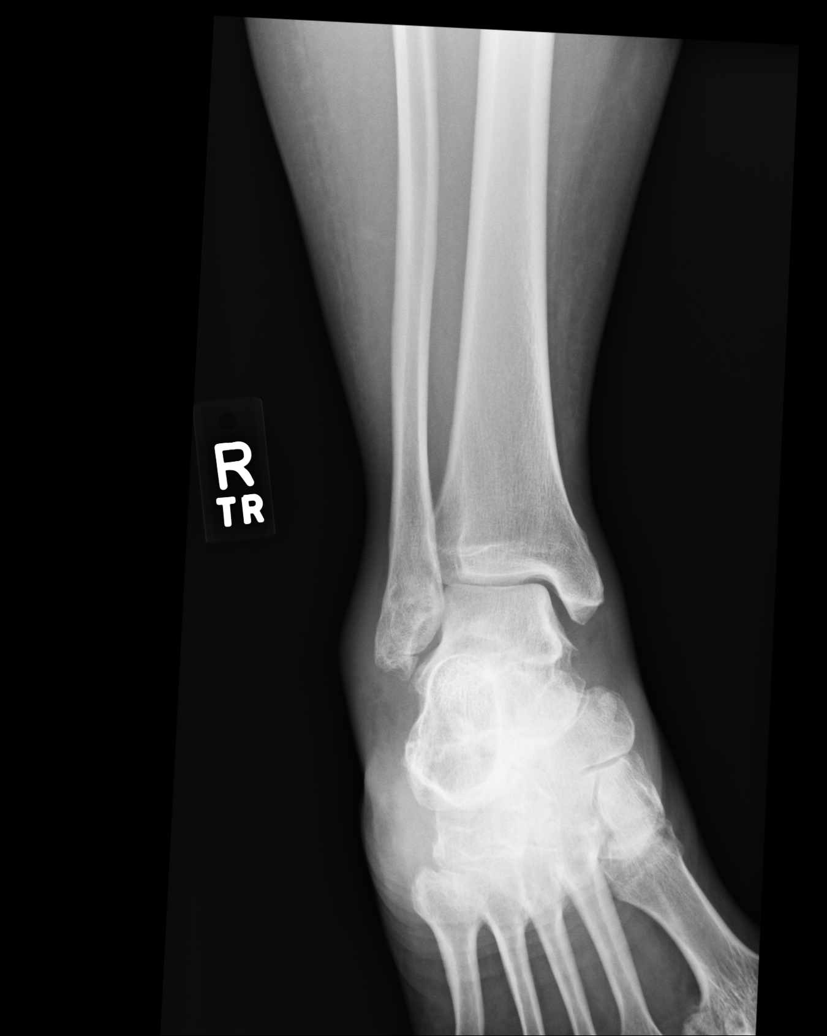
[im 4/5]
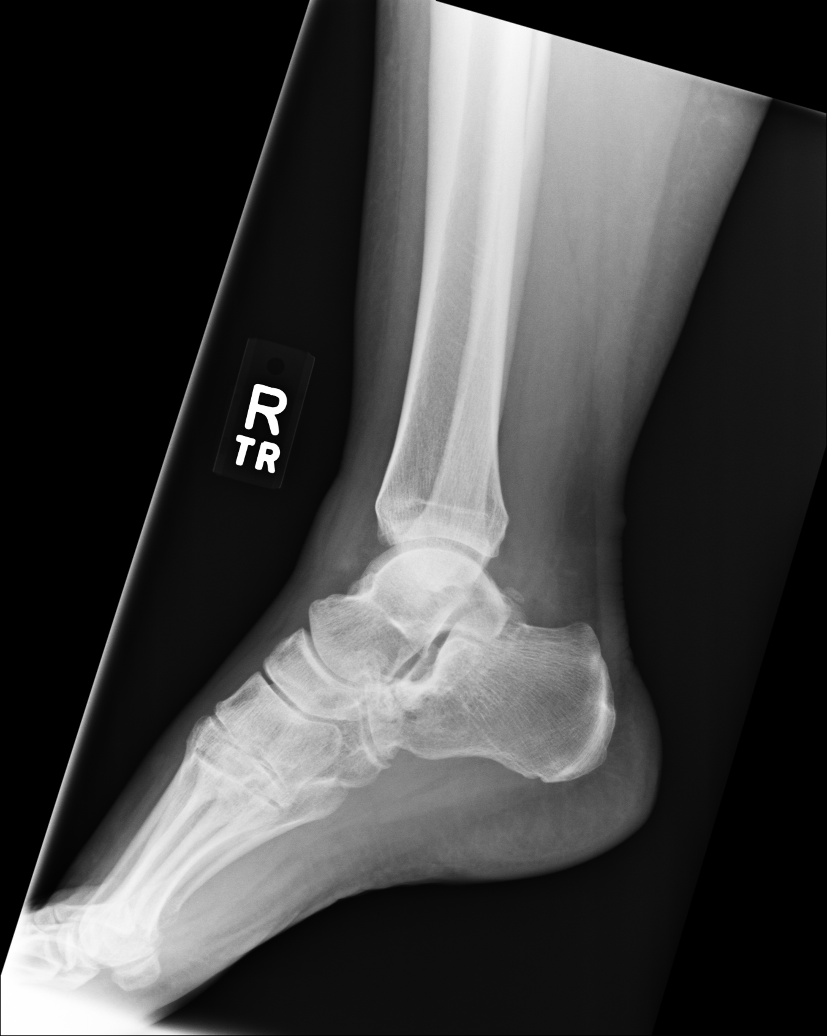
[im 5/5]
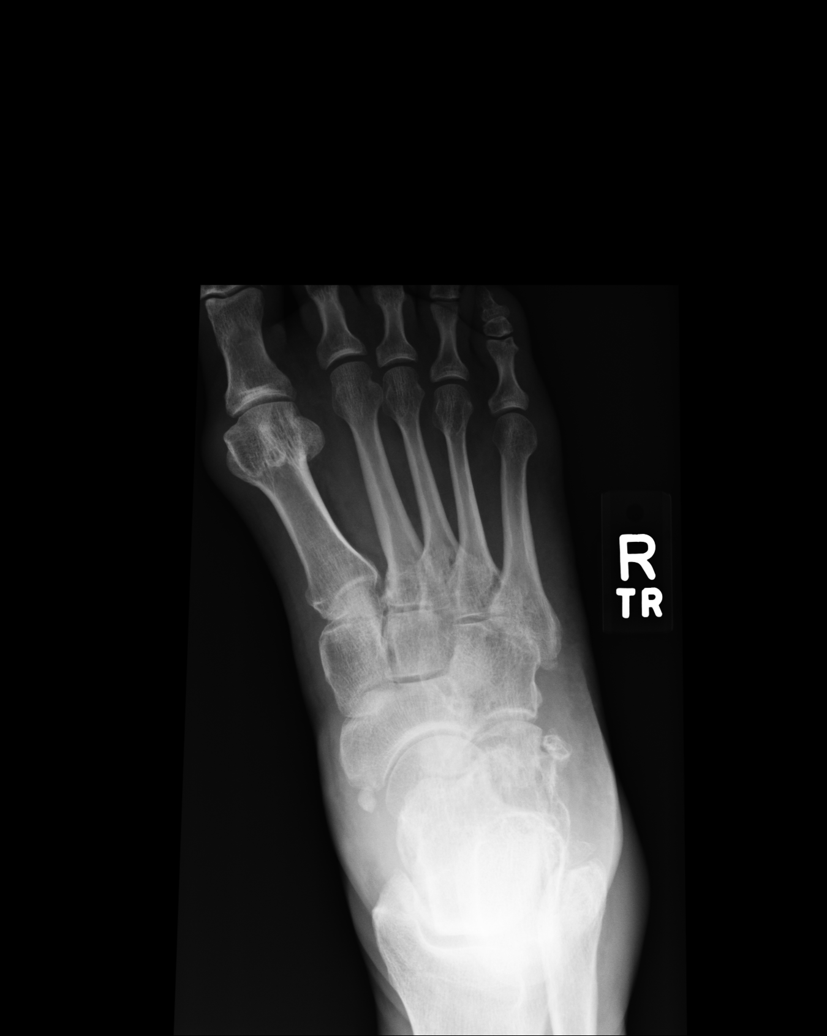

[5 of 5 positions shown; findings below may reference images not displayed]

PROCEDURE:     DXR - DXR ANKLE RIGHT COMPLETE  - February 01, 2009  [DATE]

RESULT:     An oblique lucency projects along the distal lateral malleolar
region. This appears to have corticated borders and has the appearance of a
chronic fracture, possibly the sequela of non-union of a prior fracture.
Soft tissue swelling is identified within the lateral malleolar region.
There does not appear to be evidence of acute fracture though an acute on
chronic process in the previously described lucency cannot be excluded.  No
further evidence of fracture or dislocation is appreciated.
IMPRESSION: 1. Findings which may represent the sequela of non-union of a distal lateral
malleolar fracture. An acute on chronic process is a diagnostic
consideration.

2. Findings which may represent ligamentous injury within the lateral
malleolar region.

## 2010-02-14 ENCOUNTER — Emergency Department: Payer: Self-pay | Admitting: Emergency Medicine

## 2010-03-25 IMAGING — CT CT CHEST W/ CM
2 series · 15 of 31 positions shown, 19 images · non-contrast
Comparison: none

REASON FOR EXAM: Elevated D-Dimer, decreased O2 sats
COMMENTS:

PROCEDURE:     CT  - CT CHEST (FOR PE) W  - July 02, 2009  [DATE]
RESULT:     Chest CT dated 07/02/2009 [place] to prior study dated 10/11/2008.
TECHNIQUE: Helical 3 mm sections were obtained from the thoracic inlet through the lung
bases status post intravenous administration of 85 mL 1sovue-A25. There is
suboptimal opacification of the pulmonary arterial vessels secondary to an
inability of obtaining adequate IV access. Multiple attempts were  performed
to obtain IV access. The IV access which was obtained infiltrated during the
study. Approximately 20 mL of contrast extravasated into the IV site.

[Series 4: soft tissue · axial · 0.73mm/px · z∈[+812,+856]mm · 2 of 97 slices shown]
[im 8/97  mediastinal]
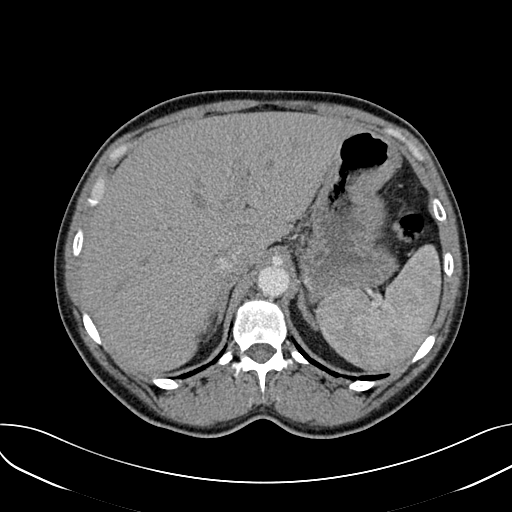
[im 23/97  mediastinal]
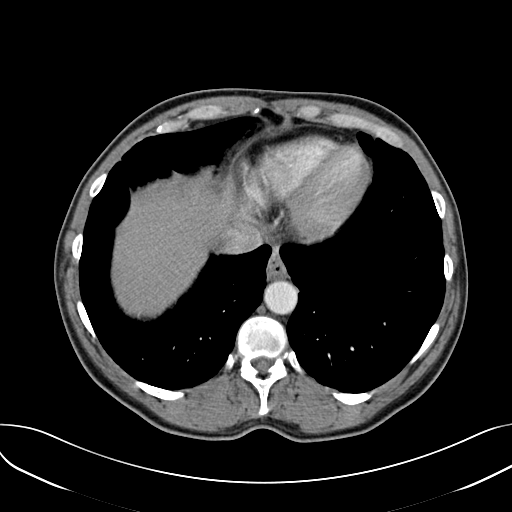

[Series 5: lung windows · axial · 0.73mm/px · z∈[+820,+1054]mm · 13 of 94 slices shown, 17 images]
[im 8/94  mediastinal]
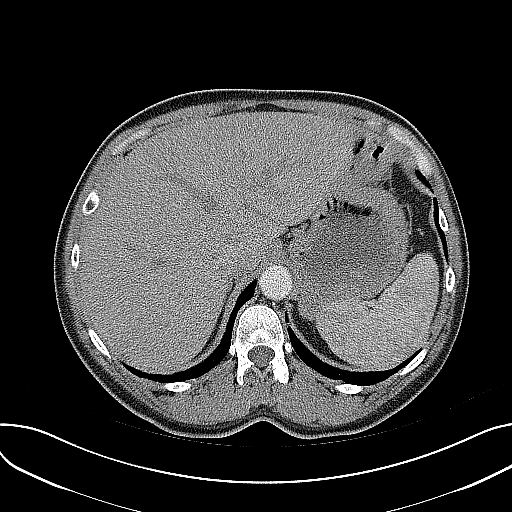
[im 8/94  lung]
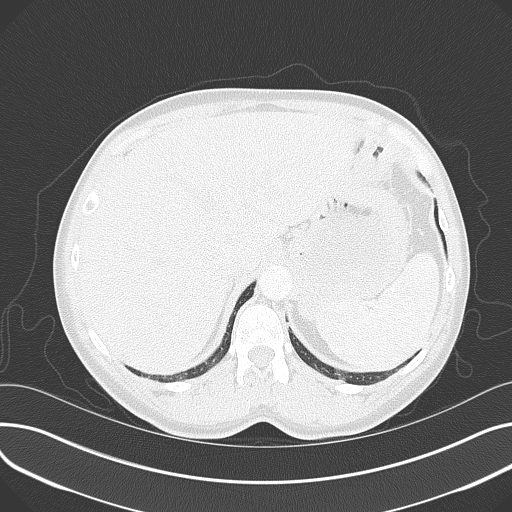
[im 15/94  lung]
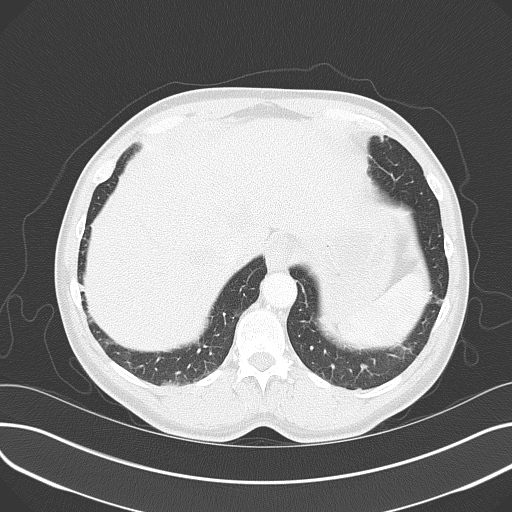
[im 22/94  lung]
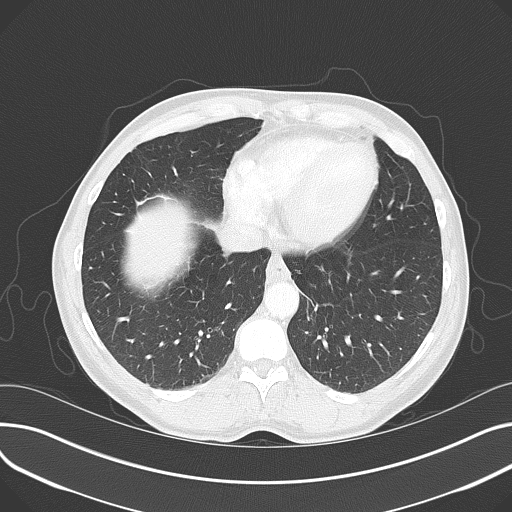
[im 29/94  lung]
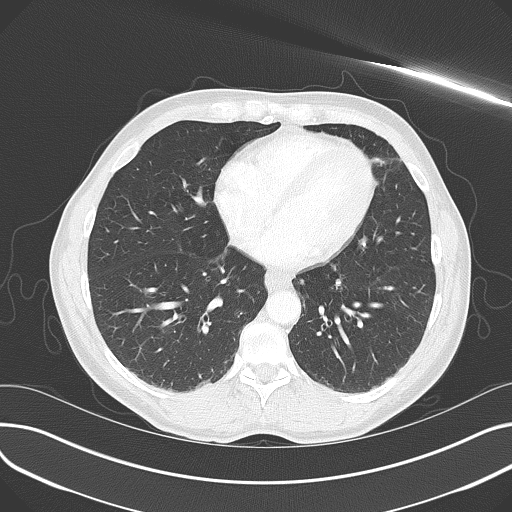
[im 36/94  mediastinal]
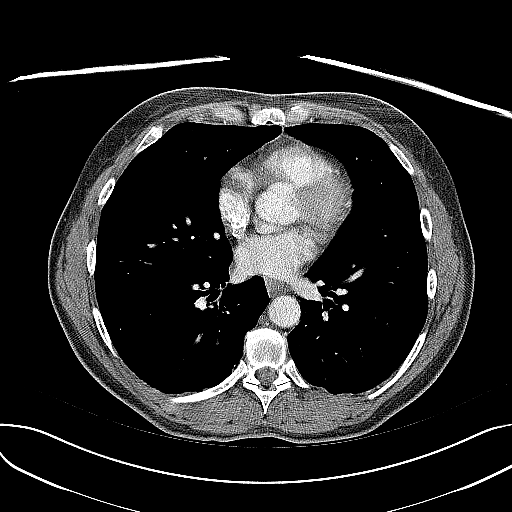
[im 36/94  lung]
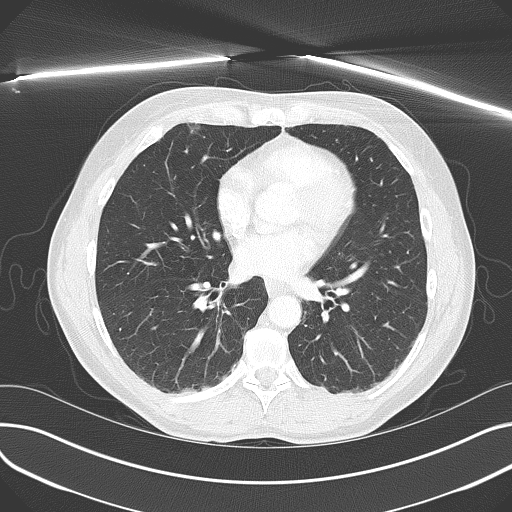
[im 43/94  lung]
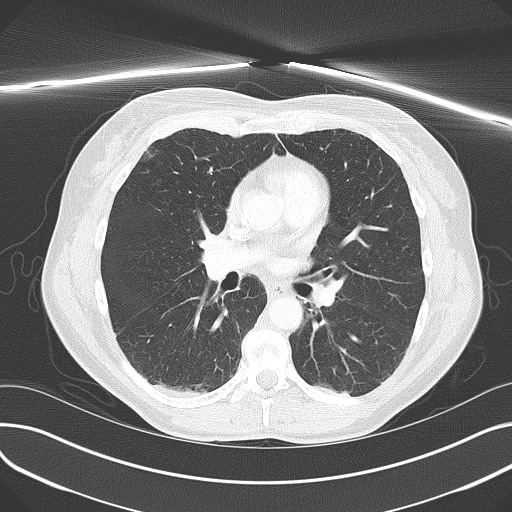
[im 47/94  lung]
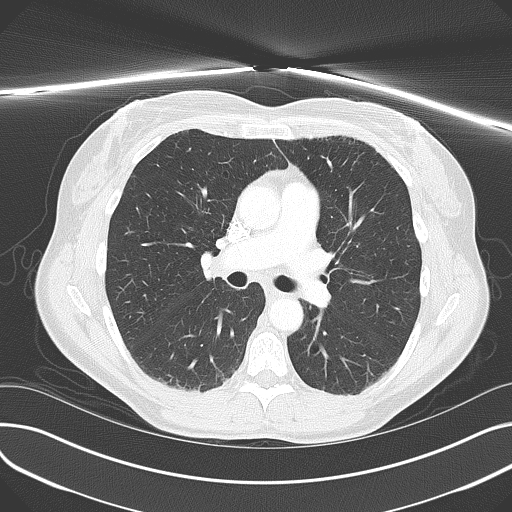
[im 51/94  lung]
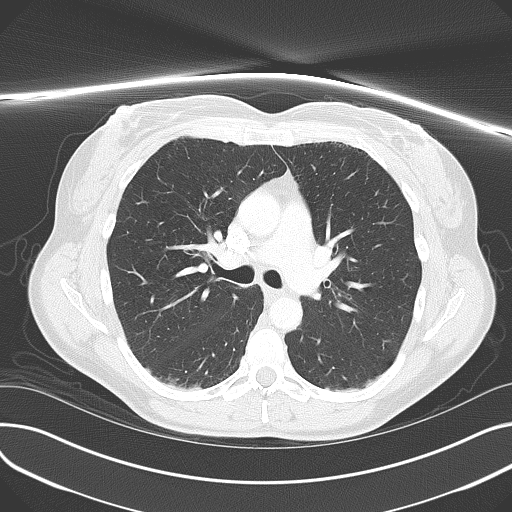
[im 58/94  mediastinal]
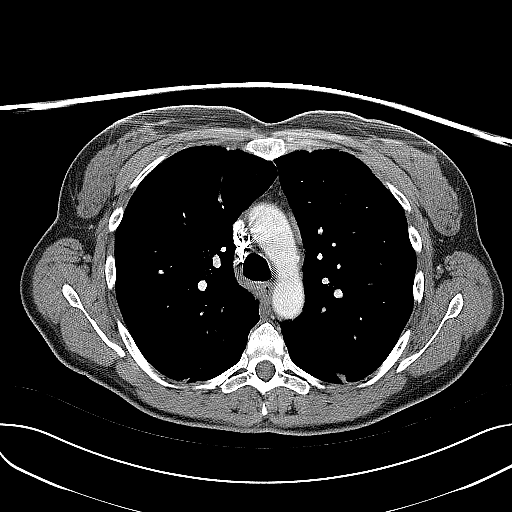
[im 58/94  lung]
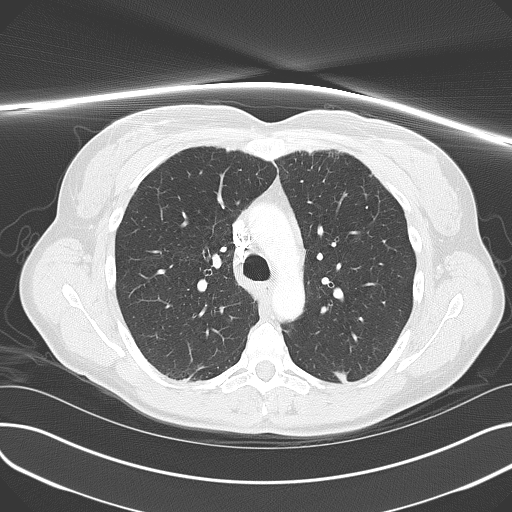
[im 65/94  lung]
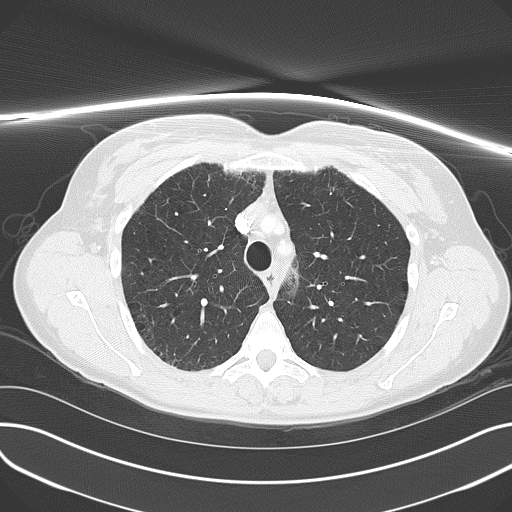
[im 72/94  lung]
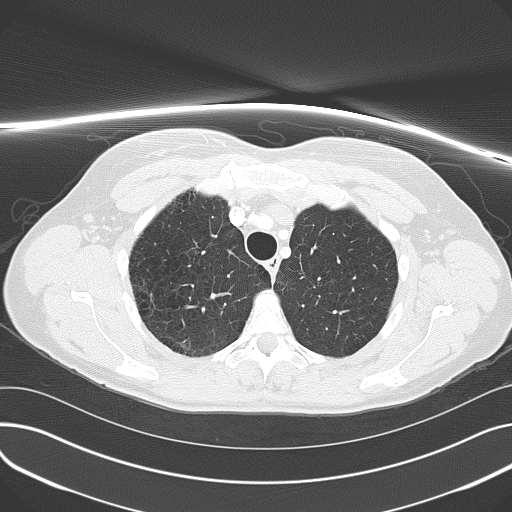
[im 79/94  lung]
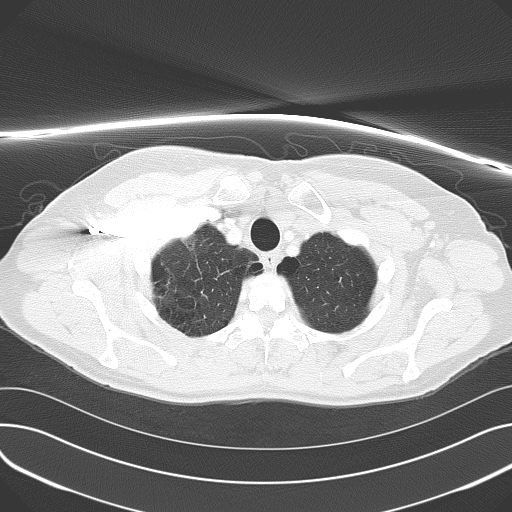
[im 86/94  mediastinal]
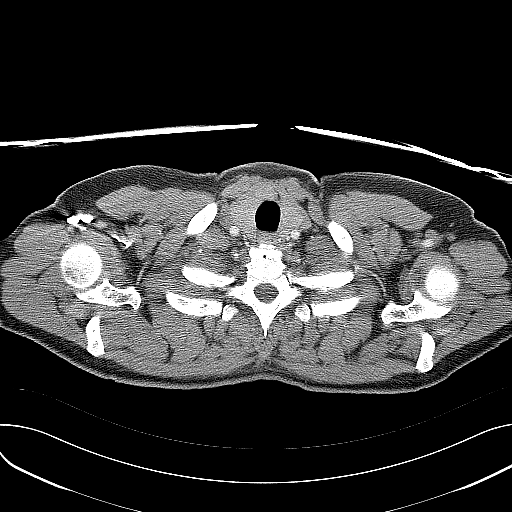
[im 86/94  lung]
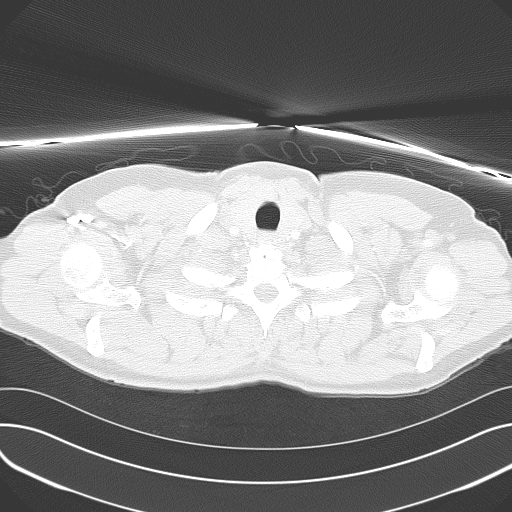

[15 of 31 positions shown; findings below may reference images not displayed]

FINDINGS: Limited evaluation of the pulmonary arteries demonstrates no evidence of
large filling defects within the main, or lobar pulmonary arteries. The
subsegmental vessels demonstrate no gross evidence of filling defects.
Evaluation mediastinum hilar regions and structures demonstrate no evidence
of masses or adenopathy. The lung parenchyma demonstrate emphysematous
changes within the lung apices as well as  mild interstitial changes in the
lung bases. The visualized upper abdominal viscera demonstrate no gross
abnormalities.
IMPRESSION: 1. Limited pulmonary arterial CT evaluation without gross evidence of
pulmonary arterial embolic disease.
2. Emphysematous and interstitial changes as described above.

## 2010-03-25 IMAGING — CR DG CHEST 1V PORT
1 series · 1 of 1 positions shown · non-contrast
Comparison: none

REASON FOR EXAM: Breathing  diffculties
COMMENTS:

[view not recorded]
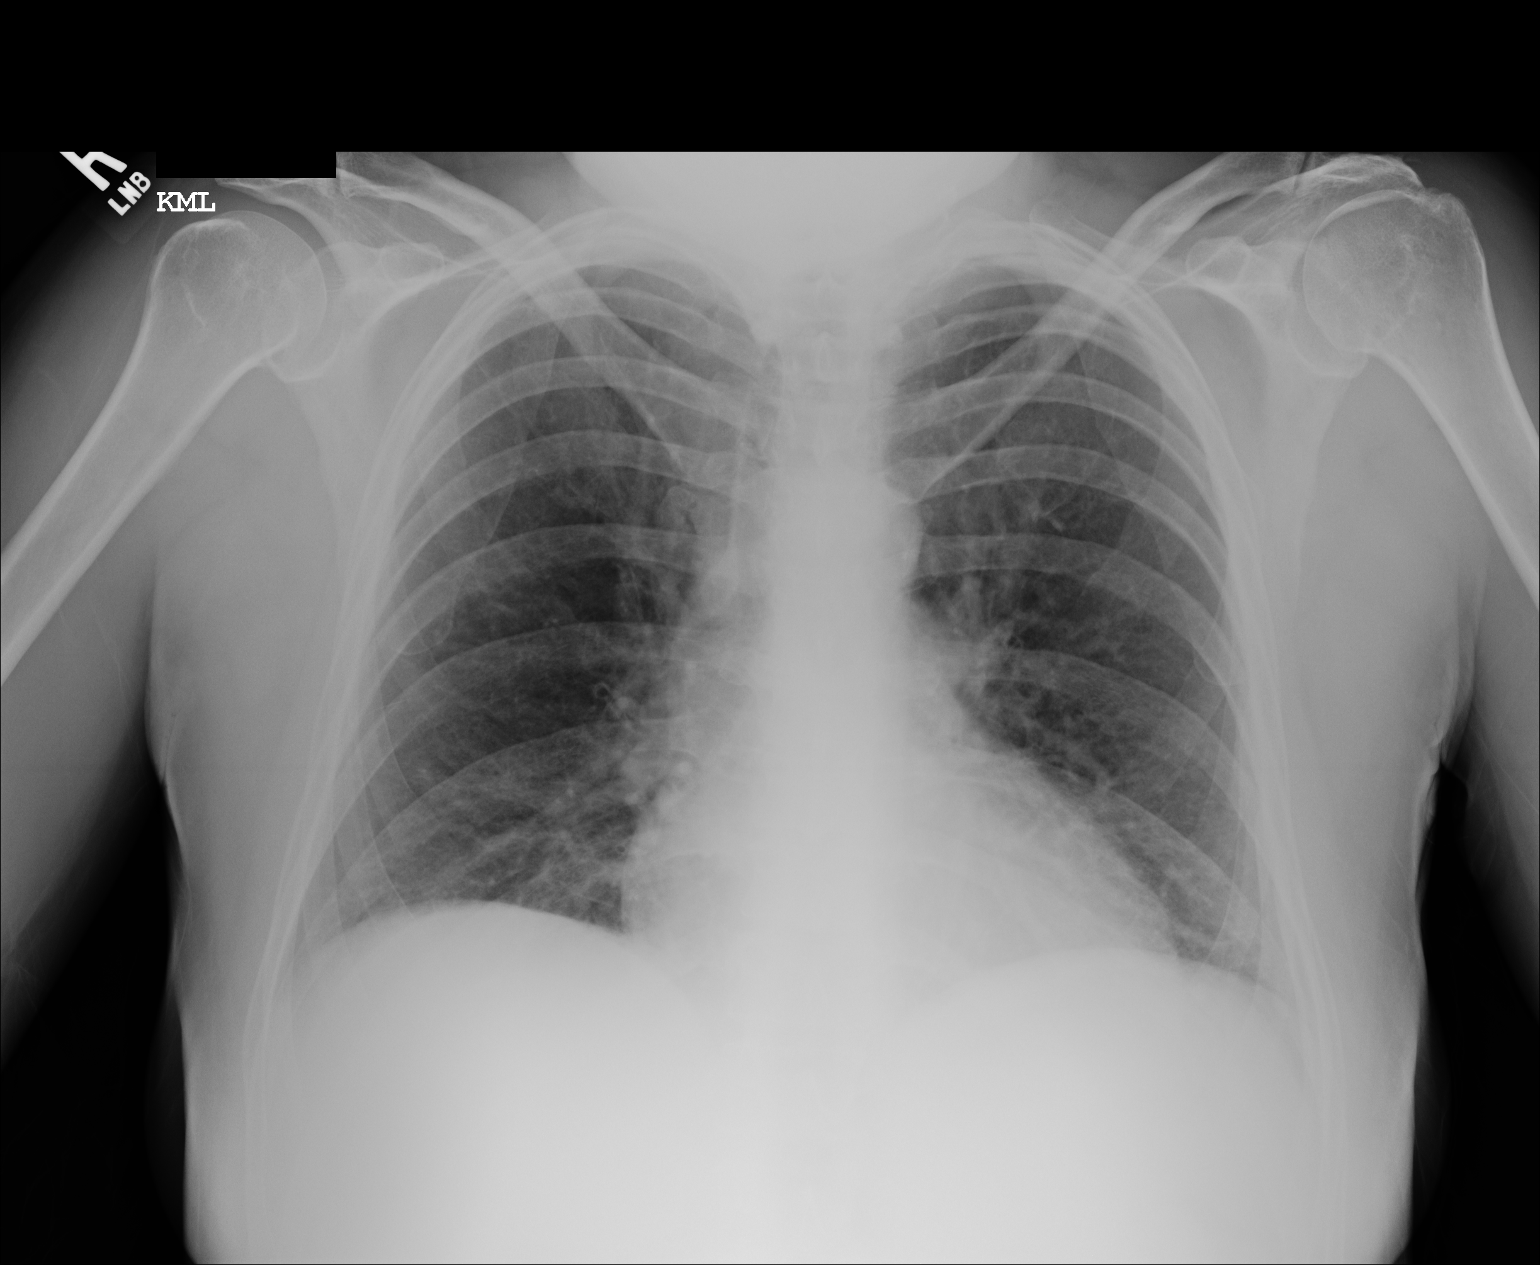

[1 of 1 positions shown; findings below may reference images not displayed]

PROCEDURE:     DXR - DXR PORTABLE CHEST SINGLE VIEW  - July 02, 2009  [DATE]

RESULT:     Portable AP view of the chest was obtained and is compared to
prior examinations of 07/16/2008 and 06/06/2008. The inspiratory level is
less than optimal. There is mild thickening of the basilar lung markings
consistent with the lack of deep inspiration. No pneumonia, pneumothorax or
pleural effusion is seen. Heart size is normal. The mediastinal and osseous
structures show no significant abnormalities.
IMPRESSION: 1.     No significant abnormalities are noted.

## 2010-05-05 ENCOUNTER — Emergency Department: Payer: Self-pay | Admitting: Emergency Medicine

## 2011-01-20 ENCOUNTER — Ambulatory Visit: Payer: Self-pay | Admitting: Family Medicine

## 2011-03-09 ENCOUNTER — Inpatient Hospital Stay: Payer: Self-pay | Admitting: Internal Medicine

## 2011-03-23 ENCOUNTER — Inpatient Hospital Stay: Payer: Self-pay | Admitting: Internal Medicine

## 2011-04-10 ENCOUNTER — Inpatient Hospital Stay: Payer: Self-pay | Admitting: Psychiatry

## 2011-04-11 ENCOUNTER — Inpatient Hospital Stay: Payer: Self-pay | Admitting: Internal Medicine

## 2011-04-19 ENCOUNTER — Emergency Department: Payer: Self-pay | Admitting: Emergency Medicine

## 2011-04-27 ENCOUNTER — Emergency Department: Payer: Self-pay | Admitting: *Deleted

## 2011-05-24 ENCOUNTER — Emergency Department: Payer: Self-pay | Admitting: Emergency Medicine

## 2011-06-05 LAB — URINALYSIS, ROUTINE W REFLEX MICROSCOPIC
Glucose, UA: NEGATIVE
Hgb urine dipstick: NEGATIVE
Protein, ur: NEGATIVE
Specific Gravity, Urine: 1.019
Urobilinogen, UA: 0.2

## 2011-06-05 LAB — URINE MICROSCOPIC-ADD ON

## 2011-07-11 ENCOUNTER — Emergency Department: Payer: Self-pay | Admitting: Emergency Medicine

## 2011-10-08 ENCOUNTER — Emergency Department: Payer: Self-pay | Admitting: Unknown Physician Specialty

## 2011-11-30 IMAGING — CR DG CHEST 2V
1 series · 2 of 2 positions shown · non-contrast
Comparison: none

REASON FOR EXAM: SOB
COMMENTS:

[Series 1: view not recorded · 0.17mm/px · 2 of 2 slices shown]
[im 1/2]
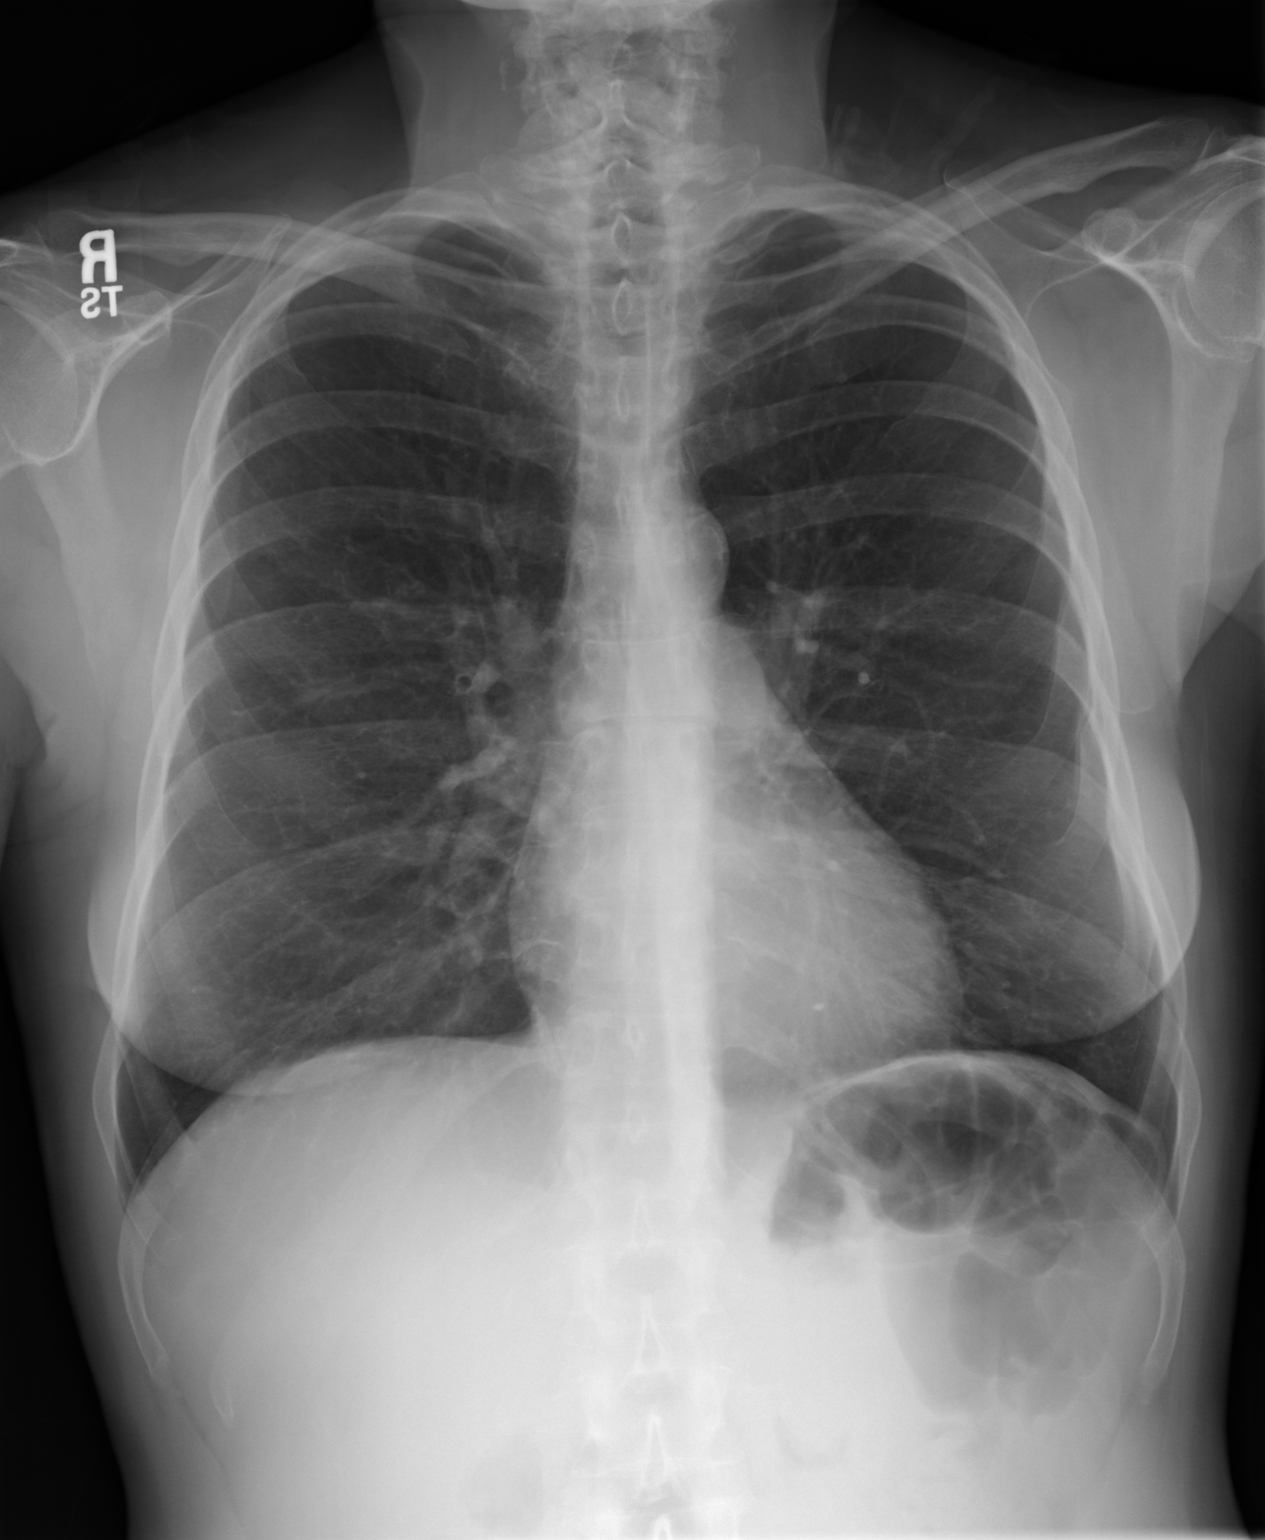
[im 2/2]
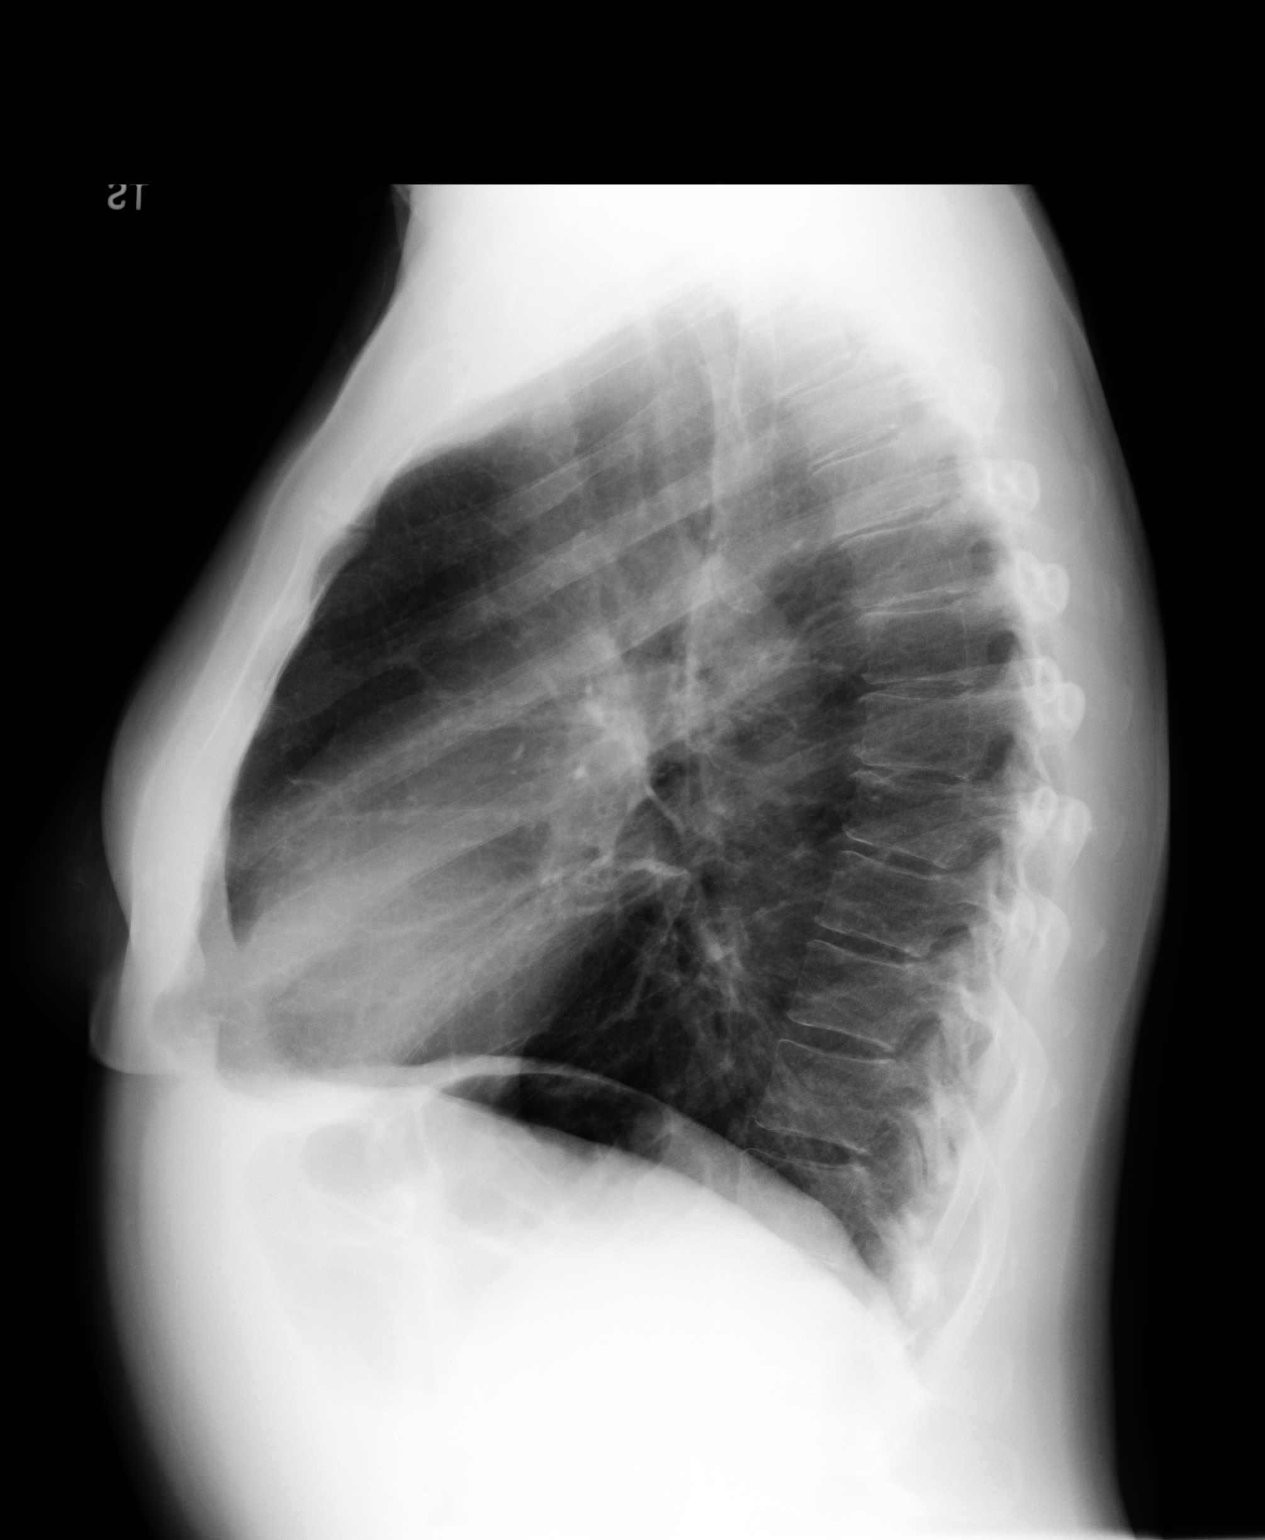

[2 of 2 positions shown; findings below may reference images not displayed]

PROCEDURE:     DXR - DXR CHEST PA (OR AP) AND LATERAL  - March 09, 2011  [DATE]

RESULT:     Comparison is made to the prior exam of 07/02/2009.

The lung fields are clear. No pneumonia, pneumothorax or pleural effusion is
seen. The heart size is normal. The chest appears mildly hyperinflated
bilaterally suspicious for a history of COPD or asthma. No acute bony
abnormalities are seen.
IMPRESSION: 1.  The lung fields are clear.
2.  The heart size is normal.
3.  The chest appears mildly hyperinflated bilaterally suspicious for a
history of COPD or asthma.

## 2011-12-03 ENCOUNTER — Emergency Department: Payer: Self-pay | Admitting: *Deleted

## 2012-01-03 ENCOUNTER — Emergency Department (HOSPITAL_COMMUNITY): Payer: Medicaid Other

## 2012-01-03 ENCOUNTER — Encounter (HOSPITAL_COMMUNITY): Payer: Self-pay | Admitting: Emergency Medicine

## 2012-01-03 ENCOUNTER — Emergency Department (HOSPITAL_COMMUNITY)
Admission: EM | Admit: 2012-01-03 | Discharge: 2012-01-03 | Disposition: A | Discharge status: Home / Self Care | Payer: Medicaid Other | Attending: Emergency Medicine | Admitting: Emergency Medicine

## 2012-01-03 DIAGNOSIS — J449 Chronic obstructive pulmonary disease, unspecified: Secondary | ICD-10-CM | POA: Insufficient documentation

## 2012-01-03 DIAGNOSIS — J4489 Other specified chronic obstructive pulmonary disease: Secondary | ICD-10-CM | POA: Insufficient documentation

## 2012-01-03 DIAGNOSIS — M549 Dorsalgia, unspecified: Secondary | ICD-10-CM | POA: Insufficient documentation

## 2012-01-03 DIAGNOSIS — M542 Cervicalgia: Secondary | ICD-10-CM | POA: Insufficient documentation

## 2012-01-03 DIAGNOSIS — G8929 Other chronic pain: Secondary | ICD-10-CM | POA: Insufficient documentation

## 2012-01-03 HISTORY — DX: Chronic obstructive pulmonary disease, unspecified: J44.9

## 2012-01-03 HISTORY — DX: Other chronic pain: G89.29

## 2012-01-03 HISTORY — DX: Dorsalgia, unspecified: M54.9

## 2012-01-03 MED ORDER — DIAZEPAM 5 MG PO TABS: 5.0000 mg | ORAL_TABLET | Freq: Two times a day (BID) | ORAL | Status: DC

## 2012-01-03 MED ORDER — OXYCODONE HCL 5 MG PO TABS: 5.0000 mg | ORAL_TABLET | ORAL | Status: AC | PRN

## 2012-01-03 MED ORDER — PREDNISONE 20 MG PO TABS
60.0000 mg | ORAL_TABLET | Freq: Once | ORAL | Status: AC
  Administered 2012-01-03: 60 mg via ORAL
  Filled 2012-01-03: qty 3

## 2012-01-03 MED ORDER — HYDROMORPHONE HCL PF 1 MG/ML IJ SOLN
1.0000 mg | Freq: Once | INTRAMUSCULAR | Status: AC
  Administered 2012-01-03: 1 mg via INTRAMUSCULAR
  Filled 2012-01-03: qty 1

## 2012-01-03 MED ORDER — PREDNISONE 50 MG PO TABS: 50.0000 mg | ORAL_TABLET | Freq: Every day | ORAL | Status: AC

## 2012-01-03 MED ORDER — OXYCODONE-ACETAMINOPHEN 5-325 MG PO TABS: 1.0000 | ORAL_TABLET | ORAL | Status: DC | PRN

## 2012-01-03 MED ORDER — OXYCODONE-ACETAMINOPHEN 5-325 MG PO TABS
2.0000 | ORAL_TABLET | Freq: Once | ORAL | Status: AC
  Administered 2012-01-03: 2 via ORAL
  Filled 2012-01-03: qty 2
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
## 2012-01-03 NOTE — Discharge Instructions (Signed)
Back Pain, Adult Low back pain is very common. About 1 in 5 people have back pain.The cause of low back pain is rarely dangerous. The pain often gets better over time.About half of people with a sudden onset of back pain feel better in just 2 weeks. About 8 in 10 people feel better by 6 weeks.  CAUSES Some common causes of back pain include:  Strain of the muscles or ligaments supporting the spine.   Wear and tear (degeneration) of the spinal discs.   Arthritis.   Direct injury to the back.  DIAGNOSIS Most of the time, the direct cause of low back pain is not known.However, back pain can be treated effectively even when the exact cause of the pain is unknown.Answering your caregiver's questions about your overall health and symptoms is one of the most accurate ways to make sure the cause of your pain is not dangerous. If your caregiver needs more information, he or she may order lab work or imaging tests (X-rays or MRIs).However, even if imaging tests show changes in your back, this usually does not require surgery. HOME CARE INSTRUCTIONS For many people, back pain returns.Since low back pain is rarely dangerous, it is often a condition that people can learn to manageon their own.   Remain active. It is stressful on the back to sit or stand in one place. Do not sit, drive, or stand in one place for more than 30 minutes at a time. Take short walks on level surfaces as soon as pain allows.Try to increase the length of time you walk each day.   Do not stay in bed.Resting more than 1 or 2 days can delay your recovery.   Do not avoid exercise or work.Your body is made to move.It is not dangerous to be active, even though your back may hurt.Your back will likely heal faster if you return to being active before your pain is gone.   Pay attention to your body when you bend and lift. Many people have less discomfortwhen lifting if they bend their knees, keep the load close to their  bodies,and avoid twisting. Often, the most comfortable positions are those that put less stress on your recovering back.   Find a comfortable position to sleep. Use a firm mattress and lie on your side with your knees slightly bent. If you lie on your back, put a pillow under your knees.   Only take over-the-counter or prescription medicines as directed by your caregiver. Over-the-counter medicines to reduce pain and inflammation are often the most helpful.Your caregiver may prescribe muscle relaxant drugs.These medicines help dull your pain so you can more quickly return to your normal activities and healthy exercise.   Put ice on the injured area.   Put ice in a plastic bag.   Place a towel between your skin and the bag.   Leave the ice on for 15 to 20 minutes, 3 to 4 times a day for the first 2 to 3 days. After that, ice and heat may be alternated to reduce pain and spasms.   Ask your caregiver about trying back exercises and gentle massage. This may be of some benefit.   Avoid feeling anxious or stressed.Stress increases muscle tension and can worsen back pain.It is important to recognize when you are anxious or stressed and learn ways to manage it.Exercise is a great option.  SEEK MEDICAL CARE IF:  You have pain that is not relieved with rest or medicine.   You have   pain that does not improve in 1 week.   You have new symptoms.   You are generally not feeling well.  SEEK IMMEDIATE MEDICAL CARE IF:   You have pain that radiates from your back into your legs.   You develop new bowel or bladder control problems.   You have unusual weakness or numbness in your arms or legs.   You develop nausea or vomiting.   You develop abdominal pain.   You feel faint.  Document Released: 08/17/2005 Document Revised: 08/06/2011 Document Reviewed: 01/05/2011 [employer] Patient Information 2012 [employer]. 

## 2012-01-07 ENCOUNTER — Emergency Department (HOSPITAL_COMMUNITY)
Admission: EM | Admit: 2012-01-07 | Discharge: 2012-01-07 | Disposition: A | Discharge status: Home / Self Care | Payer: Medicaid Other | Attending: Emergency Medicine | Admitting: Emergency Medicine

## 2012-01-07 ENCOUNTER — Encounter (HOSPITAL_COMMUNITY): Payer: Self-pay | Admitting: Emergency Medicine

## 2012-01-07 DIAGNOSIS — M549 Dorsalgia, unspecified: Secondary | ICD-10-CM | POA: Insufficient documentation

## 2012-01-07 DIAGNOSIS — G8929 Other chronic pain: Secondary | ICD-10-CM | POA: Insufficient documentation

## 2012-01-07 DIAGNOSIS — J4489 Other specified chronic obstructive pulmonary disease: Secondary | ICD-10-CM | POA: Insufficient documentation

## 2012-01-07 DIAGNOSIS — M542 Cervicalgia: Secondary | ICD-10-CM | POA: Insufficient documentation

## 2012-01-07 DIAGNOSIS — J449 Chronic obstructive pulmonary disease, unspecified: Secondary | ICD-10-CM | POA: Insufficient documentation

## 2012-01-07 HISTORY — DX: Panic disorder (episodic paroxysmal anxiety): F41.0

## 2012-01-07 HISTORY — DX: Anxiety disorder, unspecified: F41.9

## 2012-01-07 MED ORDER — OXYCODONE-ACETAMINOPHEN 5-325 MG PO TABS: 2.0000 | ORAL_TABLET | ORAL | Status: DC | PRN

## 2012-01-07 MED ORDER — OXYCODONE HCL 5 MG PO TABS: 5.0000 mg | ORAL_TABLET | ORAL | Status: AC | PRN

## 2012-01-07 MED ORDER — OXYCODONE-ACETAMINOPHEN 5-325 MG PO TABS
2.0000 | ORAL_TABLET | Freq: Once | ORAL | Status: AC
  Administered 2012-01-07: 2 via ORAL
  Filled 2012-01-07: qty 2

## 2012-01-07 MED ORDER — HYDROMORPHONE HCL PF 2 MG/ML IJ SOLN
2.0000 mg | Freq: Once | INTRAMUSCULAR | Status: DC
  Filled 2012-01-07: qty 1

## 2012-01-07 MED ORDER — HYDROMORPHONE HCL PF 2 MG/ML IJ SOLN
2.0000 mg | Freq: Once | INTRAMUSCULAR | Status: AC
  Administered 2012-01-07: 2 mg via INTRAMUSCULAR

## 2012-01-07 NOTE — ED Notes (Signed)
First meeting with patient. Patient states she has chronic back and neck pain and fell a week ago and has been in pain since the fall. Patient sitting upright in side of stretcher with NAD at this time.
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
## 2012-01-07 NOTE — Discharge Instructions (Signed)
Back Pain, Adult Low back pain is very common. About 1 in 5 people have back pain.The cause of low back pain is rarely dangerous. The pain often gets better over time.About half of people with a sudden onset of back pain feel better in just 2 weeks. About 8 in 10 people feel better by 6 weeks.  CAUSES Some common causes of back pain include:  Strain of the muscles or ligaments supporting the spine.   Wear and tear (degeneration) of the spinal discs.   Arthritis.   Direct injury to the back.  DIAGNOSIS Most of the time, the direct cause of low back pain is not known.However, back pain can be treated effectively even when the exact cause of the pain is unknown.Answering your caregiver's questions about your overall health and symptoms is one of the most accurate ways to make sure the cause of your pain is not dangerous. If your caregiver needs more information, he or she may order lab work or imaging tests (X-rays or MRIs).However, even if imaging tests show changes in your back, this usually does not require surgery. HOME CARE INSTRUCTIONS For many people, back pain returns.Since low back pain is rarely dangerous, it is often a condition that people can learn to manageon their own.   Remain active. It is stressful on the back to sit or stand in one place. Do not sit, drive, or stand in one place for more than 30 minutes at a time. Take short walks on level surfaces as soon as pain allows.Try to increase the length of time you walk each day.   Do not stay in bed.Resting more than 1 or 2 days can delay your recovery.   Do not avoid exercise or work.Your body is made to move.It is not dangerous to be active, even though your back may hurt.Your back will likely heal faster if you return to being active before your pain is gone.   Pay attention to your body when you bend and lift. Many people have less discomfortwhen lifting if they bend their knees, keep the load close to their  bodies,and avoid twisting. Often, the most comfortable positions are those that put less stress on your recovering back.   Find a comfortable position to sleep. Use a firm mattress and lie on your side with your knees slightly bent. If you lie on your back, put a pillow under your knees.   Only take over-the-counter or prescription medicines as directed by your caregiver. Over-the-counter medicines to reduce pain and inflammation are often the most helpful.Your caregiver may prescribe muscle relaxant drugs.These medicines help dull your pain so you can more quickly return to your normal activities and healthy exercise.   Put ice on the injured area.   Put ice in a plastic bag.   Place a towel between your skin and the bag.   Leave the ice on for 15 to 20 minutes, 3 to 4 times a day for the first 2 to 3 days. After that, ice and heat may be alternated to reduce pain and spasms.   Ask your caregiver about trying back exercises and gentle massage. This may be of some benefit.   Avoid feeling anxious or stressed.Stress increases muscle tension and can worsen back pain.It is important to recognize when you are anxious or stressed and learn ways to manage it.Exercise is a great option.  SEEK MEDICAL CARE IF:  You have pain that is not relieved with rest or medicine.   You have   pain that does not improve in 1 week.   You have new symptoms.   You are generally not feeling well.  SEEK IMMEDIATE MEDICAL CARE IF:   You have pain that radiates from your back into your legs.   You develop new bowel or bladder control problems.   You have unusual weakness or numbness in your arms or legs.   You develop nausea or vomiting.   You develop abdominal pain.   You feel faint.  Document Released: 08/17/2005 Document Revised: 08/06/2011 Document Reviewed: 01/05/2011 [employer] Patient Information 2012 [employer]. 

## 2012-01-07 NOTE — ED Notes (Signed)
EDP at bedside  

## 2012-01-07 NOTE — ED Notes (Signed)
PT. REPORTS PERSISTENT NECK AND MID/LOW BACK PAIN ONSET LAST Saturday , SEEN HERE AND WAS PRESCRIBED WITH PAIN MEDICATIONS .

## 2012-01-20 ENCOUNTER — Emergency Department: Payer: Self-pay | Admitting: Emergency Medicine

## 2012-01-22 ENCOUNTER — Ambulatory Visit: Payer: Medicaid Other | Admitting: Family Medicine

## 2012-01-29 ENCOUNTER — Ambulatory Visit (INDEPENDENT_AMBULATORY_CARE_PROVIDER_SITE_OTHER): Payer: Medicaid Other | Admitting: Family Medicine

## 2012-01-29 ENCOUNTER — Encounter: Payer: Self-pay | Admitting: Family Medicine

## 2012-01-29 VITALS — BP 159/88 | HR 102 | Ht 60.0 in | Wt 156.0 lb

## 2012-01-29 DIAGNOSIS — F3341 Major depressive disorder, recurrent, in partial remission: Secondary | ICD-10-CM | POA: Insufficient documentation

## 2012-01-29 DIAGNOSIS — J449 Chronic obstructive pulmonary disease, unspecified: Secondary | ICD-10-CM

## 2012-01-29 DIAGNOSIS — F329 Major depressive disorder, single episode, unspecified: Secondary | ICD-10-CM

## 2012-01-29 DIAGNOSIS — M549 Dorsalgia, unspecified: Secondary | ICD-10-CM

## 2012-01-29 DIAGNOSIS — R030 Elevated blood-pressure reading, without diagnosis of hypertension: Secondary | ICD-10-CM

## 2012-01-29 DIAGNOSIS — Z1231 Encounter for screening mammogram for malignant neoplasm of breast: Secondary | ICD-10-CM

## 2012-01-29 DIAGNOSIS — G8929 Other chronic pain: Secondary | ICD-10-CM

## 2012-01-29 NOTE — Patient Instructions (Signed)
Will get records from your obgyn and primary care doctor  Please get records of your labwork done by your psychiatrist.  Will set you up for mammogram at the breast center  Will refer you to pain management doctor  Follow-up every 6 months

## 2012-01-30 DIAGNOSIS — J439 Emphysema, unspecified: Secondary | ICD-10-CM | POA: Insufficient documentation

## 2012-01-30 DIAGNOSIS — J449 Chronic obstructive pulmonary disease, unspecified: Secondary | ICD-10-CM | POA: Insufficient documentation

## 2012-01-30 MED ORDER — FLUTICASONE-SALMETEROL 250-50 MCG/DOSE IN AEPB: 1.0000 | INHALATION_SPRAY | Freq: Two times a day (BID) | RESPIRATORY_TRACT | Status: DC

## 2012-01-30 MED ORDER — OXYBUTYNIN CHLORIDE 5 MG PO TABS: 5.0000 mg | ORAL_TABLET | Freq: Three times a day (TID) | ORAL | Status: DC

## 2012-01-30 MED ORDER — ALBUTEROL SULFATE HFA 108 (90 BASE) MCG/ACT IN AERS: 2.0000 | INHALATION_SPRAY | Freq: Four times a day (QID) | RESPIRATORY_TRACT | Status: DC | PRN

## 2012-01-30 NOTE — Assessment & Plan Note (Signed)
Patient states never had elevated in past with regular care.  Feels only due to pain today.  Will follow-up at next visit.

## 2012-01-30 NOTE — Progress Notes (Signed)
  Subjective:    Patient ID: Jamie Schultz, female    DOB: [birth year], 51 y.o.   MRN: [id number]  HPI Here to establish primary care.  Chronic pain:  Decades of chronic pain.  Had previous been on high narcotic doses, states she prefers lower doses.  Reports in past being on Soma 350 mg TID, Oxycodone 30 mg QID prescribed by ER.  Has not been taking it regularly due to no healthcare.  Most of pain is thoracic and low back.  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  COPD: has not had medicines recently Notes PFT in past year.  Needs refill  Overactive bladder:  Takes oxybutynin from previous PCP Review of Systems Patient Information Form: Screening and ROS  AUDIT-C Score: 1 Do you feel safe in relationships? yes PHQ-2:positive  Review of Symptoms  General:  Negative for nexplained weight loss, fever Skin: Negative for new or changing mole, sore that won't heal HEENT: Negative for trouble hearing, trouble seeing, ringing in ears, mouth sores, hoarseness, change in voice, dysphagia. CV:  Negative for chest pain, dyspnea, edema, palpitations Resp: Negative for cough, dyspnea, hemoptysis GI: Negative for nausea, vomiting, diarrhea, abdominal pain, melena, hematochezia. GU: Negative for dysuria, incontinence, urinary hesitance, hematuria, vaginal or penile discharge, polyuria, sexual difficulty, lumps in testicle or breasts MSK: Negative for muscle cramps or aches, joint pain or swelling Neuro: Negative for headaches, weakness,dizziness, passing out/fainting Psych: Negative for memory problems  Positive for neuropathy, radiating, constipation, anxiety, stress, muscle cramps, numbness on top of feet.     Objective:   Physical Exam GEN: Alert & Oriented, No acute distress CV:  Regular Rate & Rhythm, no murmur Respiratory:  Normal work of breathing, CTAB Abd:  + BS, soft, no tenderness to palpation Ext: no pre-tibial edema BACK: No  ttp paraspinal       Assessment & Plan:  Prevention: Colonoscopy 2010 Tetanus 2006 Pneumonia 2006  Awaiting records from previous physicians

## 2012-01-30 NOTE — Assessment & Plan Note (Signed)
Will refill albuterol and advair, request records of recent PFT's from her previous PCP Dr. Glenis Smoker

## 2012-01-30 NOTE — Assessment & Plan Note (Signed)
Patient with long standing chronic pain treated by previous provider prior to relocation of care to [facility].  Will refer to Pain management for further evaluation and treatment.

## 2012-02-11 ENCOUNTER — Encounter: Payer: Self-pay | Admitting: Family Medicine

## 2012-02-11 ENCOUNTER — Telehealth: Payer: Self-pay | Admitting: *Deleted

## 2012-02-11 DIAGNOSIS — E785 Hyperlipidemia, unspecified: Secondary | ICD-10-CM | POA: Insufficient documentation

## 2012-02-11 NOTE — Telephone Encounter (Signed)
Spoke with patient to inquire about the previous pain treatment she had been receiving. Per [facility] they are needing previous notes from the doctor who was treating patient for her pain so that she can be scheduled for the clinic. The records from [facility] do not include any pain treatment. Called patient so that I can find out where other care had been and she stated that it was from Dr. Evelene Croon, who had a three year suspension from 2009. Patient last seen there 2008. I called [facility] to confirm doctor working there and receptionist stated that he is at that practice. I asked if he was at that practice at the time of the patient's treatment and she said that he was. I was asked patient's name and she looked up records, I will fax over [facility] to receive them. Receptionist stated that for reasons they can not discuss patient will

## 2012-02-17 ENCOUNTER — Encounter (HOSPITAL_COMMUNITY): Payer: Self-pay | Admitting: Emergency Medicine

## 2012-02-17 ENCOUNTER — Emergency Department (HOSPITAL_COMMUNITY)
Admission: EM | Admit: 2012-02-17 | Discharge: 2012-02-17 | Disposition: A | Discharge status: Home / Self Care | Payer: Medicaid Other | Attending: Emergency Medicine | Admitting: Emergency Medicine

## 2012-02-17 DIAGNOSIS — W010XXA Fall on same level from slipping, tripping and stumbling without subsequent striking against object, initial encounter: Secondary | ICD-10-CM | POA: Insufficient documentation

## 2012-02-17 DIAGNOSIS — G8929 Other chronic pain: Secondary | ICD-10-CM | POA: Insufficient documentation

## 2012-02-17 DIAGNOSIS — F410 Panic disorder [episodic paroxysmal anxiety] without agoraphobia: Secondary | ICD-10-CM | POA: Insufficient documentation

## 2012-02-17 DIAGNOSIS — M549 Dorsalgia, unspecified: Secondary | ICD-10-CM

## 2012-02-17 DIAGNOSIS — F17200 Nicotine dependence, unspecified, uncomplicated: Secondary | ICD-10-CM | POA: Insufficient documentation

## 2012-02-17 DIAGNOSIS — Y92009 Unspecified place in unspecified non-institutional (private) residence as the place of occurrence of the external cause: Secondary | ICD-10-CM | POA: Insufficient documentation

## 2012-02-17 DIAGNOSIS — M545 Low back pain, unspecified: Secondary | ICD-10-CM | POA: Insufficient documentation

## 2012-02-17 DIAGNOSIS — M546 Pain in thoracic spine: Secondary | ICD-10-CM | POA: Insufficient documentation

## 2012-02-17 DIAGNOSIS — J4489 Other specified chronic obstructive pulmonary disease: Secondary | ICD-10-CM | POA: Insufficient documentation

## 2012-02-17 DIAGNOSIS — J449 Chronic obstructive pulmonary disease, unspecified: Secondary | ICD-10-CM | POA: Insufficient documentation

## 2012-02-17 MED ORDER — HYDROMORPHONE HCL PF 1 MG/ML IJ SOLN
1.0000 mg | Freq: Once | INTRAMUSCULAR | Status: AC
  Administered 2012-02-17: 1 mg via INTRAMUSCULAR

## 2012-02-17 MED ORDER — HYDROMORPHONE HCL PF 1 MG/ML IJ SOLN
1.0000 mg | Freq: Once | INTRAMUSCULAR | Status: DC
  Filled 2012-02-17: qty 1

## 2012-02-17 MED ORDER — IBUPROFEN 800 MG PO TABS: 800.0000 mg | ORAL_TABLET | Freq: Three times a day (TID) | ORAL | Status: AC

## 2012-02-17 MED ORDER — DIAZEPAM 5 MG PO TABS: 5.0000 mg | ORAL_TABLET | Freq: Three times a day (TID) | ORAL | Status: DC | PRN

## 2012-02-17 MED ORDER — OXYCODONE-ACETAMINOPHEN 5-325 MG PO TABS: 1.0000 | ORAL_TABLET | Freq: Three times a day (TID) | ORAL | Status: AC | PRN

## 2012-02-17 NOTE — ED Notes (Signed)
Pt family member very unhappy stating the doctor ordered her pain medication and she wants it know. Pt and family member informed that the doctor has not ordered any medication yet and that when he does this nurse will get it for them
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
## 2012-02-17 NOTE — ED Notes (Signed)
Pt st's she fell earlier today Pt c/o pain in back from top to bottom

## 2012-02-17 NOTE — Discharge Instructions (Signed)
Back Pain, Adult Low back pain is very common. About 1 in 5 people have back pain.The cause of low back pain is rarely dangerous. The pain often gets better over time.About half of people with a sudden onset of back pain feel better in just 2 weeks. About 8 in 10 people feel better by 6 weeks.  CAUSES Some common causes of back pain include:  Strain of the muscles or ligaments supporting the spine.   Wear and tear (degeneration) of the spinal discs.   Arthritis.   Direct injury to the back.  DIAGNOSIS Most of the time, the direct cause of low back pain is not known.However, back pain can be treated effectively even when the exact cause of the pain is unknown.Answering your caregiver's questions about your overall health and symptoms is one of the most accurate ways to make sure the cause of your pain is not dangerous. If your caregiver needs more information, he or she may order lab work or imaging tests (X-rays or MRIs).However, even if imaging tests show changes in your back, this usually does not require surgery. HOME CARE INSTRUCTIONS For many people, back pain returns.Since low back pain is rarely dangerous, it is often a condition that people can learn to manageon their own.   Remain active. It is stressful on the back to sit or stand in one place. Do not sit, drive, or stand in one place for more than 30 minutes at a time. Take short walks on level surfaces as soon as pain allows.Try to increase the length of time you walk each day.   Do not stay in bed.Resting more than 1 or 2 days can delay your recovery.   Do not avoid exercise or work.Your body is made to move.It is not dangerous to be active, even though your back may hurt.Your back will likely heal faster if you return to being active before your pain is gone.   Pay attention to your body when you bend and lift. Many people have less discomfortwhen lifting if they bend their knees, keep the load close to their  bodies,and avoid twisting. Often, the most comfortable positions are those that put less stress on your recovering back.   Find a comfortable position to sleep. Use a firm mattress and lie on your side with your knees slightly bent. If you lie on your back, put a pillow under your knees.   Only take over-the-counter or prescription medicines as directed by your caregiver. Over-the-counter medicines to reduce pain and inflammation are often the most helpful.Your caregiver may prescribe muscle relaxant drugs.These medicines help dull your pain so you can more quickly return to your normal activities and healthy exercise.   Put ice on the injured area.   Put ice in a plastic bag.   Place a towel between your skin and the bag.   Leave the ice on for 15 to 20 minutes, 3 to 4 times a day for the first 2 to 3 days. After that, ice and heat may be alternated to reduce pain and spasms.   Ask your caregiver about trying back exercises and gentle massage. This may be of some benefit.   Avoid feeling anxious or stressed.Stress increases muscle tension and can worsen back pain.It is important to recognize when you are anxious or stressed and learn ways to manage it.Exercise is a great option.  SEEK MEDICAL CARE IF:  You have pain that is not relieved with rest or medicine.   You have   pain that does not improve in 1 week.   You have new symptoms.   You are generally not feeling well.  SEEK IMMEDIATE MEDICAL CARE IF:   You have pain that radiates from your back into your legs.   You develop new bowel or bladder control problems.   You have unusual weakness or numbness in your arms or legs.   You develop nausea or vomiting.   You develop abdominal pain.   You feel faint.  Document Released: 08/17/2005 Document Revised: 08/06/2011 Document Reviewed: 01/05/2011 [employer] Patient Information 2012 [employer]. 

## 2012-02-17 NOTE — ED Notes (Signed)
EDP at bedside to evaluate pt at this time

## 2012-02-18 ENCOUNTER — Emergency Department: Payer: Self-pay | Admitting: Emergency Medicine

## 2012-02-19 LAB — TSH: Thyroid Stimulating Horm: 7.67 u[IU]/mL — ABNORMAL HIGH

## 2012-02-22 ENCOUNTER — Telehealth: Payer: Self-pay | Admitting: Family Medicine

## 2012-02-22 ENCOUNTER — Emergency Department (HOSPITAL_COMMUNITY)
Admission: EM | Admit: 2012-02-22 | Discharge: 2012-02-22 | Disposition: A | Discharge status: Home / Self Care | Payer: Medicaid Other | Attending: Emergency Medicine | Admitting: Emergency Medicine

## 2012-02-22 ENCOUNTER — Encounter (HOSPITAL_COMMUNITY): Payer: Self-pay | Admitting: *Deleted

## 2012-02-22 DIAGNOSIS — G8929 Other chronic pain: Secondary | ICD-10-CM | POA: Insufficient documentation

## 2012-02-22 DIAGNOSIS — Z79899 Other long term (current) drug therapy: Secondary | ICD-10-CM | POA: Insufficient documentation

## 2012-02-22 DIAGNOSIS — R296 Repeated falls: Secondary | ICD-10-CM

## 2012-02-22 DIAGNOSIS — M545 Low back pain, unspecified: Secondary | ICD-10-CM | POA: Insufficient documentation

## 2012-02-22 DIAGNOSIS — M549 Dorsalgia, unspecified: Secondary | ICD-10-CM

## 2012-02-22 DIAGNOSIS — Z9181 History of falling: Secondary | ICD-10-CM | POA: Insufficient documentation

## 2012-02-22 MED ORDER — OXYCODONE HCL 10 MG PO TB12: 10.0000 mg | ORAL_TABLET | Freq: Two times a day (BID) | ORAL | Status: DC

## 2012-02-22 MED ORDER — ONDANSETRON 4 MG PO TBDP
4.0000 mg | ORAL_TABLET | Freq: Once | ORAL | Status: AC
  Administered 2012-02-22: 4 mg via ORAL
  Filled 2012-02-22: qty 1

## 2012-02-22 MED ORDER — HYDROMORPHONE HCL PF 1 MG/ML IJ SOLN
2.0000 mg | Freq: Once | INTRAMUSCULAR | Status: AC
  Administered 2012-02-22: 2 mg via INTRAMUSCULAR
  Filled 2012-02-22: qty 2

## 2012-02-22 MED ORDER — IBUPROFEN 800 MG PO TABS: 800.0000 mg | ORAL_TABLET | Freq: Three times a day (TID) | ORAL | Status: AC

## 2012-02-22 NOTE — Discharge Instructions (Signed)
Back Exercises Back exercises help treat and prevent back injuries. The goal of back exercises is to increase the strength of your abdominal and back muscles and the flexibility of your back. These exercises should be started when you no longer have back pain. Back exercises include:  Pelvic Tilt. Lie on your back with your knees bent. Tilt your pelvis until the lower part of your back is against the floor. Hold this position 5 to 10 sec and repeat 5 to 10 times.   Knee to Chest. Pull first 1 knee up against your chest and hold for 20 to 30 seconds, repeat this with the other knee, and then both knees. This may be done with the other leg straight or bent, whichever feels better.   Sit-Ups or Curl-Ups. Bend your knees 90 degrees. Start with tilting your pelvis, and do a partial, slow sit-up, lifting your trunk only 30 to 45 degrees off the floor. Take at least 2 to 3 seconds for each sit-up. Do not do sit-ups with your knees out straight. If partial sit-ups are difficult, simply do the above but with only tightening your abdominal muscles and holding it as directed.   Hip-Lift. Lie on your back with your knees flexed 90 degrees. Push down with your feet and shoulders as you raise your hips a couple inches off the floor; hold for 10 seconds, repeat 5 to 10 times.   Back arches. Lie on your stomach, propping yourself up on bent elbows. Slowly press on your hands, causing an arch in your low back. Repeat 3 to 5 times. Any initial stiffness and discomfort should lessen with repetition over time.   Shoulder-Lifts. Lie face down with arms beside your body. Keep hips and torso pressed to floor as you slowly lift your head and shoulders off the floor.  Do not overdo your exercises, especially in the beginning. Exercises may cause you some mild back discomfort which lasts for a few minutes; however, if the pain is more severe, or lasts for more than 15 minutes, do not continue exercises until you see your  caregiver. Improvement with exercise therapy for back problems is slow.  See your caregivers for assistance with developing a proper back exercise program. Document Released: 09/24/2004 Document Revised: 08/06/2011 Document Reviewed: 08/17/2005 [facility] Patient Information 2012 [place], [place].  RESOURCE GUIDE  Dental Problems  Patients with Medicaid: [facility] [phone]                                           [phone] Phone:  [phone]                                                   Phone:  [phone]  If unable to pay or uninsured, contact:  [facility] or [facility]. to become qualified for the adult dental clinic.  Chronic Pain Problems Contact [facility]  [phone] Patients need to be referred by their primary care doctor.  Insufficient Money for Medicine Contact [employer] Way:  call "211" or [facility] 567-660-5574.  No Primary Care Doctor Call [facility]  [phone] Other agencies that provide inexpensive medical care    [facility]  [phone]    [facility]  [phone]    [facility]  [phone]    [facility]  [phone]    [facility]  [phone]    [facility]  [phone]  Psychological Services [facility]  [phone] [facility]  [phone] [facility]   [phone] (emergency services [phone])  Abuse/Neglect [facility] Child Abuse Hotline [phone])220-5606 [facility] Child Abuse Hotline 818 204 2566 (After Hours)  Emergency Shelter [facility] [phone]  Maternity Homes [facility] [place] [facility] [phone] [facility] [phone]  MRSA Hotline #:   812-231-6657    [facility] Resources  [facility]  United Way                           [facility] Dept. [address] [place]                     [address]         [address]  Blondell Reveal Phone:  [phone]                                  Phone:  [phone]                   Phone:  [phone]  [facility] Phone:  [phone]  [facility] Child Abuse Hotline [phone] [phone] (After Hours)

## 2012-02-22 NOTE — ED Notes (Signed)
Patient reports 2 nights ago she fell.  She has hx of back problems.  Patient has ddd.   Patient states she fell onto her back.  She is having neck pain and shoulder pain and lower back and right hip and elbow pain.  [facility].  She states she had multiple xrays and she has them with her on a disk.  Patient states she is in severe pain and can't stand it.

## 2012-02-22 NOTE — Telephone Encounter (Signed)
Patient wants to know if you would call her in [employer] Eth[employer]lor[employer].  She uses this to spray on her muscle spasms.  She says it takes the edge off.  Also, she needs muscle relaxers to last until she gets in to a pain clinic.  She dropped off papers for you.  I placed them in your box.
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
## 2012-02-22 NOTE — Telephone Encounter (Signed)
Patient is calling because she is at the ER and has had new labs done and wants Dr. Earnest Bailey to write an Rx for Thyroid medication.  She wants to bring the new labwork over and wants the Rx written when she comes.   She is also asking for pain meds for until she can get into Pain Management. She uses [facility] in [place] if she wants to call it in.

## 2012-02-23 NOTE — Telephone Encounter (Signed)
Please tell patient Peggye Pitt is not something I prescribe for patient use.   I will not prescribe any pain medications or soma.  Please let her know we have fwd her information and are in the process of obtaining and appt.

## 2012-02-23 NOTE — Telephone Encounter (Signed)
Patient needs to make office appointment to discuss requests for new prescriptions

## 2012-02-23 NOTE — Telephone Encounter (Signed)
Forwarded to Dr. Earnest Bailey for advice.[name] [place]

## 2012-02-24 ENCOUNTER — Telehealth: Payer: Self-pay | Admitting: *Deleted

## 2012-02-24 NOTE — Telephone Encounter (Signed)
Called and lvm informing pt that Dr. Earnest Bailey is NOT going to fill her pain meds. She is  ONLY going to go over the thyroid issue and give her a Rx for this.  I asked that if she has any questions that she call our office back.[name] [place]

## 2012-02-24 NOTE — Telephone Encounter (Signed)
Called pt and told her that she will have to be seen in order to obtain the pain meds. appt made for 7.1.2013 @ 830 am.Jamie Schultz, [place]

## 2012-02-29 ENCOUNTER — Encounter: Payer: Self-pay | Admitting: Family Medicine

## 2012-02-29 ENCOUNTER — Ambulatory Visit (INDEPENDENT_AMBULATORY_CARE_PROVIDER_SITE_OTHER): Payer: Medicaid Other | Admitting: Family Medicine

## 2012-02-29 VITALS — BP 128/78 | HR 88 | Temp 97.8°F | Wt 160.0 lb

## 2012-02-29 DIAGNOSIS — N952 Postmenopausal atrophic vaginitis: Secondary | ICD-10-CM

## 2012-02-29 DIAGNOSIS — E559 Vitamin D deficiency, unspecified: Secondary | ICD-10-CM

## 2012-02-29 DIAGNOSIS — J449 Chronic obstructive pulmonary disease, unspecified: Secondary | ICD-10-CM

## 2012-02-29 DIAGNOSIS — E785 Hyperlipidemia, unspecified: Secondary | ICD-10-CM

## 2012-02-29 DIAGNOSIS — E039 Hypothyroidism, unspecified: Secondary | ICD-10-CM

## 2012-02-29 DIAGNOSIS — M549 Dorsalgia, unspecified: Secondary | ICD-10-CM

## 2012-02-29 DIAGNOSIS — G8929 Other chronic pain: Secondary | ICD-10-CM

## 2012-02-29 DIAGNOSIS — J4489 Other specified chronic obstructive pulmonary disease: Secondary | ICD-10-CM

## 2012-02-29 MED ORDER — ESTROGENS, CONJUGATED 0.625 MG/GM VA CREA: TOPICAL_CREAM | Freq: Every day | VAGINAL | Status: DC

## 2012-02-29 MED ORDER — LEVOTHYROXINE SODIUM 112 MCG PO TABS: 112.0000 ug | ORAL_TABLET | Freq: Every day | ORAL | Status: DC

## 2012-02-29 NOTE — Assessment & Plan Note (Signed)
Hysterectomy, no vasomotor symptoms.  Will rx premarin cream, daily x 2 weeks, then twice weekly maintenance.

## 2012-02-29 NOTE — Patient Instructions (Addendum)
Will start thyroid medicine today  Come back for fasting bloodwork in 6 weeks (make lab appt)  Use estrogen cream to help with vaginal dryness

## 2012-02-29 NOTE — Assessment & Plan Note (Signed)
Discussed with patient that I have referred her to a pain specialist to review her case and to evaluate for best treatment.  I have reinforced that I will not be prescribing any controlled substances.

## 2012-02-29 NOTE — Assessment & Plan Note (Addendum)
Labs from [facility] 02/19/11:  T4: 3.0 (L) TSH: 7.67  Will start synthroid at 112 mcg, follow-up in 6 weeks for lab recheck

## 2012-02-29 NOTE — Progress Notes (Signed)
  Subjective:    Patient ID: Jamie Schultz, female    DOB: 1960-12-25, 51 y.o.   MRN: [id number]  HPI Here today to discuss lab results from ER visit to [facility]  Since last visit one month ago, when she established care, I have received records from her PCP - last seen in 2009- Dr. Glenis Smoker.  I also received Documentation from ER visit for fall.  Chronic pain:  Requests pain medication, or muscles relaxants.  Discussed that I do not think it is appropriate medical care to prescribe controlled substances.  We are awaiting pain clinic referral.  Hypothyroidism:  Was first diagnosed one month ago by labs from psychiatrist.  Had this confirmed and lab results sent to me from [facility].  Notes weight gain, fatigue.  Menopausal:  No hot flashes.  Concerned about vaginal dryness.  States she is getting married in 2 days.  Dryness persists despite adequate lubricants.      Review of Systems See HPI    Objective:   Physical Exam GEN: Irritable, argumentative.      Assessment & Plan:

## 2012-03-09 ENCOUNTER — Telehealth: Payer: Self-pay | Admitting: Family Medicine

## 2012-03-09 NOTE — Telephone Encounter (Signed)
Dr. Earnest Bailey states patient needs to be seen  for evaluation.   Again offered appointment today but she cannot come until tomorrow .  She knows to go to ED if symptoms worsen.

## 2012-03-09 NOTE — Telephone Encounter (Addendum)
Spoke with patient and she states she started taking synthroid on 07/01. The next morning her feet were swollen. Since then she has had continued swelling  of feet later in the day and abdomen. States she looks like she is 5-6 month pregnant. She has shortness of breath when she bend over to tie shoes and with exertion. Offered appointment today but she has no transportation today . Needs late appointment tomorrow and offered 3:00 and she is not sure about that time but will check and call me back.  Will forward to Dr. Earnest Bailey.   Patient stopped taking synthroid Monday. Monday was last day taking it. She has not noticed and difference in swelling yet.

## 2012-03-09 NOTE — Telephone Encounter (Signed)
Thyroid meds are causing her to swell - needs to talk to nurse Stopped on Monday

## 2012-03-10 ENCOUNTER — Encounter: Payer: Self-pay | Admitting: Family Medicine

## 2012-03-10 ENCOUNTER — Encounter (HOSPITAL_COMMUNITY): Payer: Self-pay | Admitting: *Deleted

## 2012-03-10 ENCOUNTER — Ambulatory Visit (INDEPENDENT_AMBULATORY_CARE_PROVIDER_SITE_OTHER): Payer: Medicaid Other | Admitting: Family Medicine

## 2012-03-10 ENCOUNTER — Emergency Department (HOSPITAL_COMMUNITY)
Admission: EM | Admit: 2012-03-10 | Discharge: 2012-03-10 | Disposition: A | Discharge status: Home / Self Care | Payer: Medicaid Other | Attending: Emergency Medicine | Admitting: Emergency Medicine

## 2012-03-10 VITALS — BP 118/81 | HR 95 | Temp 97.2°F | Ht 60.0 in | Wt 160.0 lb

## 2012-03-10 DIAGNOSIS — J449 Chronic obstructive pulmonary disease, unspecified: Secondary | ICD-10-CM | POA: Insufficient documentation

## 2012-03-10 DIAGNOSIS — Z79899 Other long term (current) drug therapy: Secondary | ICD-10-CM | POA: Insufficient documentation

## 2012-03-10 DIAGNOSIS — E785 Hyperlipidemia, unspecified: Secondary | ICD-10-CM

## 2012-03-10 DIAGNOSIS — R109 Unspecified abdominal pain: Secondary | ICD-10-CM | POA: Insufficient documentation

## 2012-03-10 DIAGNOSIS — E039 Hypothyroidism, unspecified: Secondary | ICD-10-CM

## 2012-03-10 DIAGNOSIS — N952 Postmenopausal atrophic vaginitis: Secondary | ICD-10-CM

## 2012-03-10 DIAGNOSIS — F411 Generalized anxiety disorder: Secondary | ICD-10-CM | POA: Insufficient documentation

## 2012-03-10 DIAGNOSIS — R0602 Shortness of breath: Secondary | ICD-10-CM | POA: Insufficient documentation

## 2012-03-10 DIAGNOSIS — R768 Other specified abnormal immunological findings in serum: Secondary | ICD-10-CM | POA: Insufficient documentation

## 2012-03-10 DIAGNOSIS — J4489 Other specified chronic obstructive pulmonary disease: Secondary | ICD-10-CM | POA: Insufficient documentation

## 2012-03-10 LAB — RAPID URINE DRUG SCREEN, HOSP PERFORMED
Amphetamines: NOT DETECTED
Barbiturates: NOT DETECTED
Benzodiazepines: POSITIVE — AB
Tetrahydrocannabinol: NOT DETECTED

## 2012-03-10 LAB — COMPREHENSIVE METABOLIC PANEL
ALT: 19 U/L (ref 0–35)
AST: 21 U/L (ref 0–37)
Albumin: 4.1 g/dL (ref 3.5–5.2)
Alkaline Phosphatase: 82 U/L (ref 39–117)
Chloride: 106 mEq/L (ref 96–112)
Potassium: 4.4 mEq/L (ref 3.5–5.1)
Sodium: 143 mEq/L (ref 135–145)
Total Bilirubin: 0.1 mg/dL — ABNORMAL LOW (ref 0.3–1.2)
Total Protein: 7.7 g/dL (ref 6.0–8.3)

## 2012-03-10 LAB — CBC WITH DIFFERENTIAL/PLATELET
Basophils Relative: 1 % (ref 0–1)
Eosinophils Absolute: 0.7 10*3/uL (ref 0.0–0.7)
Hemoglobin: 14.2 g/dL (ref 12.0–15.0)
MCH: 31.7 pg (ref 26.0–34.0)
MCHC: 34.1 g/dL (ref 30.0–36.0)
Monocytes Relative: 6 % (ref 3–12)
Neutro Abs: 4.5 10*3/uL (ref 1.7–7.7)
Neutrophils Relative %: 54 % (ref 43–77)
Platelets: 336 10*3/uL (ref 150–400)
RBC: 4.48 MIL/uL (ref 3.87–5.11)

## 2012-03-10 LAB — URINALYSIS, ROUTINE W REFLEX MICROSCOPIC
Bilirubin Urine: NEGATIVE
Ketones, ur: NEGATIVE mg/dL
Nitrite: NEGATIVE
Specific Gravity, Urine: 1.019 (ref 1.005–1.030)
Urobilinogen, UA: 0.2 mg/dL (ref 0.0–1.0)
pH: 6 (ref 5.0–8.0)

## 2012-03-10 LAB — URINE MICROSCOPIC-ADD ON

## 2012-03-10 MED ORDER — OXYCODONE-ACETAMINOPHEN 5-325 MG PO TABS
2.0000 | ORAL_TABLET | Freq: Once | ORAL | Status: AC
  Administered 2012-03-10: 2 via ORAL
  Filled 2012-03-10: qty 2

## 2012-03-10 MED ORDER — OXYCODONE-ACETAMINOPHEN 10-325 MG PO TABS: 1.0000 | ORAL_TABLET | ORAL | Status: AC | PRN

## 2012-03-10 MED ORDER — LEVOTHYROXINE SODIUM 50 MCG PO TABS: 50.0000 ug | ORAL_TABLET | Freq: Every day | ORAL | Status: DC

## 2012-03-10 NOTE — ED Notes (Signed)
The pt has had abd pain and back pain  For 4-5 days.  She was sent here from outpatient for further tests.  She has chronic back pain and is trying to get into a pain clinic

## 2012-03-10 NOTE — ED Notes (Signed)
Family member asked to speak with Consulting civil engineer. Charge RN informed

## 2012-03-10 NOTE — Assessment & Plan Note (Signed)
Increase TSH and decreased T4 based on outside lab.  Subjective swelling and edema.  No evidence of weight gain since last visit before synthroid.   No angina.   I don't feel symptoms are related to synthroid.  Discussed approach to patient, we decided to re-start at 50 mcg and slowly titrate up.

## 2012-03-10 NOTE — Patient Instructions (Addendum)
Will decrease your synthroid to 50 mcg  Will need to recheck your labs in 6 weeks

## 2012-03-10 NOTE — ED Notes (Signed)
Pt updated on care and wait time. Pillow given.
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
## 2012-03-10 NOTE — Assessment & Plan Note (Addendum)
Encouraged outpatient workup for abdominal pain.  Patient states pain is too severe and she must have answer today.  She plans on going directly to the ER for further evaluation.

## 2012-03-10 NOTE — ED Notes (Signed)
Pt. Complains of  Lower quadrant abdominal,  bilateral flank pain, and lower back pain x 4 days. Abdomen is distended in all 4 quadrants and tender on plapation.  Nausea x 1 day with no vomiting.  Pt. Reports told she has a cysts on her right kidney on 6/12/13t [facility]. Pain is 10/10. A.O. X 4.  Family at bedside.

## 2012-03-10 NOTE — ED Notes (Signed)
Updated on wait time.  

## 2012-03-10 NOTE — ED Provider Notes (Signed)
Medical screening examination/treatment/procedure(s) were performed by non-physician practitioner and as supervising physician I was immediately available for consultation/collaboration.   Maleigh Bagot, MD 03/10/12 2343 

## 2012-03-10 NOTE — ED Notes (Signed)
Updated family member on wait times

## 2012-03-10 NOTE — Assessment & Plan Note (Signed)
Patient states estrogen cream made vagina dryness worse and had an odor.  Advised she does not have to use cream and discourage further use.

## 2012-03-10 NOTE — Assessment & Plan Note (Signed)
Reminded patient she is overdue for fasting labwork

## 2012-03-10 NOTE — ED Notes (Signed)
Pt. D/C home.  Ambulatory. Fiance' at bedside. Will drive her home. A.O. X 4 NAD.

## 2012-03-10 NOTE — Progress Notes (Signed)
  Subjective:    Patient ID: KAIDANCE PANTOJA, female    DOB: 1960-10-22, 51 y.o.   MRN: [id number]  HPI Here for evaluation of swelling and abdominal pain  Was started on synthroid 112 mcg last week for hypothyroidism.  States after 3 days, gained 8 pounds, had abdominal swelling, pain, trouble breathing, nausea, constipation.  No fever.  Symptoms have not improved since stopping it several days ago.  Also notes estrogen cream was foul smelling and made her vagina more dry.  Tearful in room.  Reports continued chronic pain, chronic rectal bleeding and prolapse/ Review of Systems See HPI    Objective:   Physical Exam  Wt Readings from Last 3 Encounters:  03/10/12 160 lb (72.576 kg)  02/29/12 160 lb (72.576 kg)  01/29/12 156 lb (70.761 kg)    GEN: Alert & Oriented,  Tearful, anxious.  With her fiancee.  CV:  Regular Rate & Rhythm, no murmur, no JVD Respiratory:  Normal work of breathing, CTAB Abd:  + BS, soft, abdominal pain throughout not consistent with palpation.  No true rebound or guarding. Ext: no pre-tibial edema       Assessment & Plan:

## 2012-03-29 ENCOUNTER — Telehealth: Payer: Self-pay | Admitting: Family Medicine

## 2012-03-29 NOTE — Telephone Encounter (Signed)
LVM explaining that we faxed out the referral and office visit notes to [facility] center for pain and rehab and they will review her notes and then contact her if she will be accepted by them. I also reminded her that she needs to get her medicaid card updated, changed to our name on it.

## 2012-03-29 NOTE — Telephone Encounter (Signed)
Is asking about referral to pain clinic - went to ED and they were going to refer her, but Dr Earnest Bailey was supposed to be doing this.

## 2012-03-30 ENCOUNTER — Telehealth: Payer: Self-pay | Admitting: *Deleted

## 2012-03-30 NOTE — Telephone Encounter (Signed)
LVM for patient to call back. ?

## 2012-03-30 NOTE — Telephone Encounter (Signed)
lvm for pt to call back and speak with Jamie Schultz, per the pain clinic they have reviewed her previous notes and they don't have anything to offer the pt. There are other options for her however she will have to pay out of pocket to be seen .[name] [place]

## 2012-03-31 ENCOUNTER — Encounter: Payer: Self-pay | Admitting: Family Medicine

## 2012-03-31 NOTE — Telephone Encounter (Signed)
Attempted to call patient no answer, Jamie Schultz

## 2012-04-02 IMAGING — CR DG KNEE COMPLETE 4+V*R*
1 series · 5 of 5 positions shown · non-contrast
Comparison: none

REASON FOR EXAM: fall, pain to R knee, hx of TKR
COMMENTS:

PROCEDURE:     DXR - DXR KNEE RT COMP WITH OBLIQUES  - July 11, 2011  [DATE]
RESULT:     History: Post-op TKR

[Series 1: t knee ap right · 0.14mm/px · 5 of 5 slices shown]
[im 1/5]
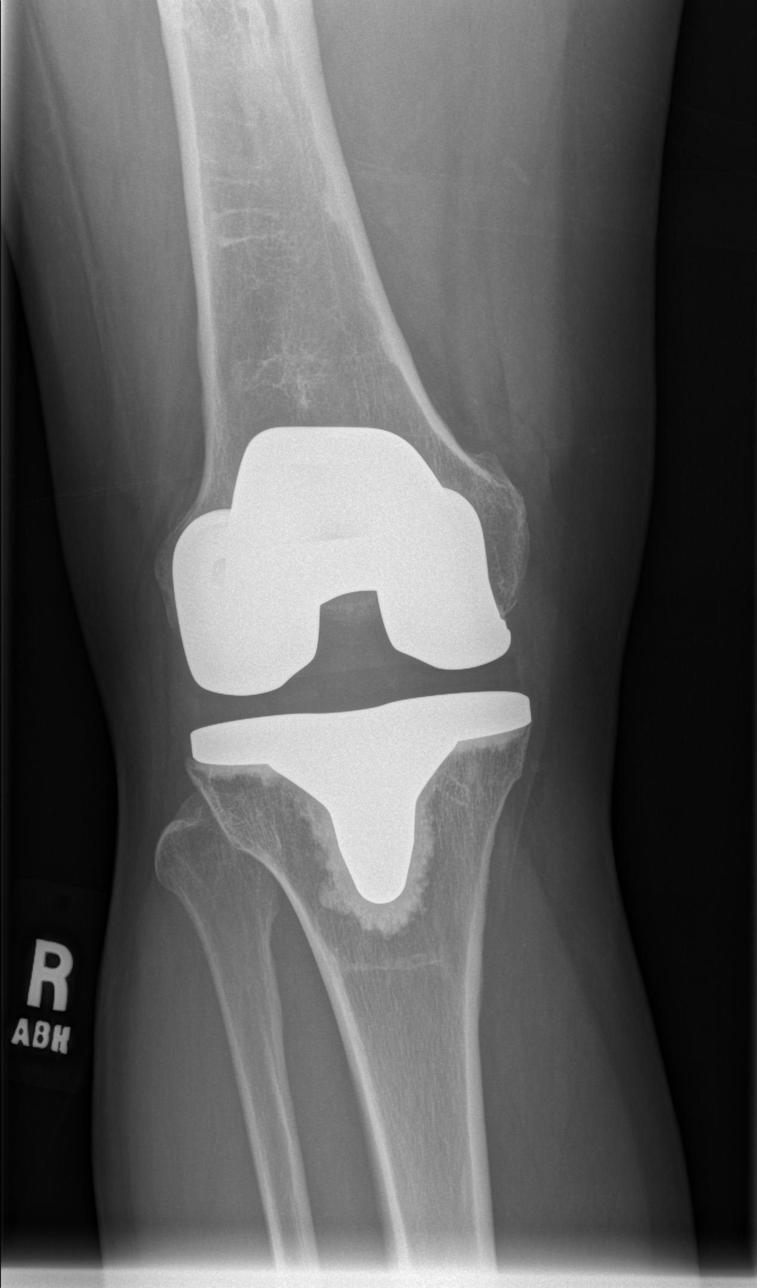
[im 2/5]
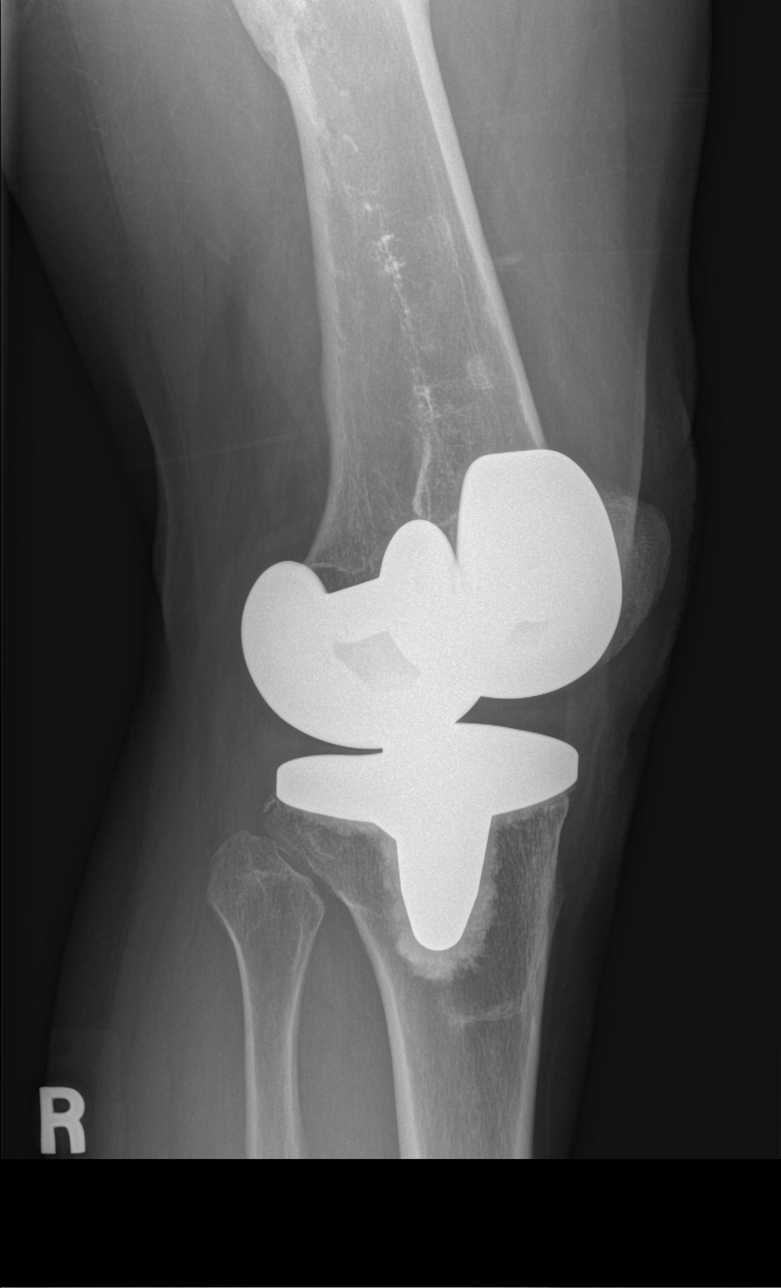
[im 3/5]
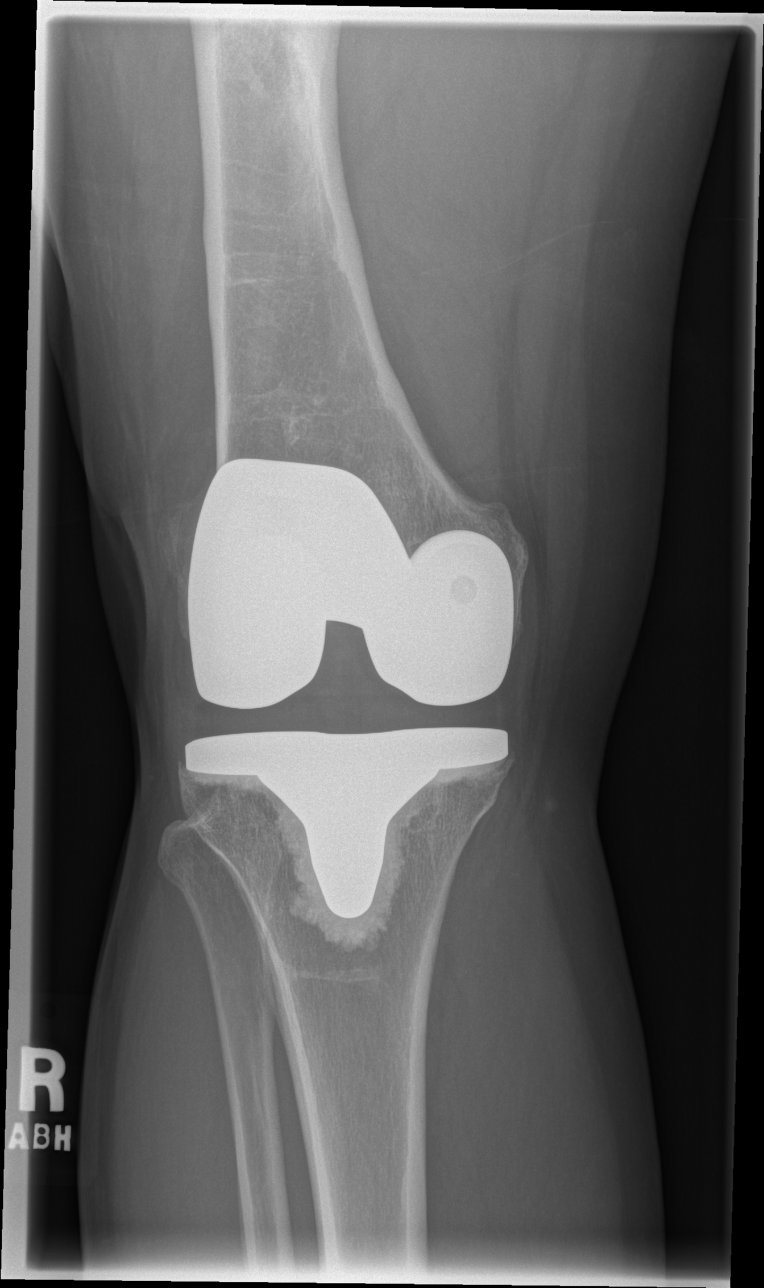
[im 4/5]
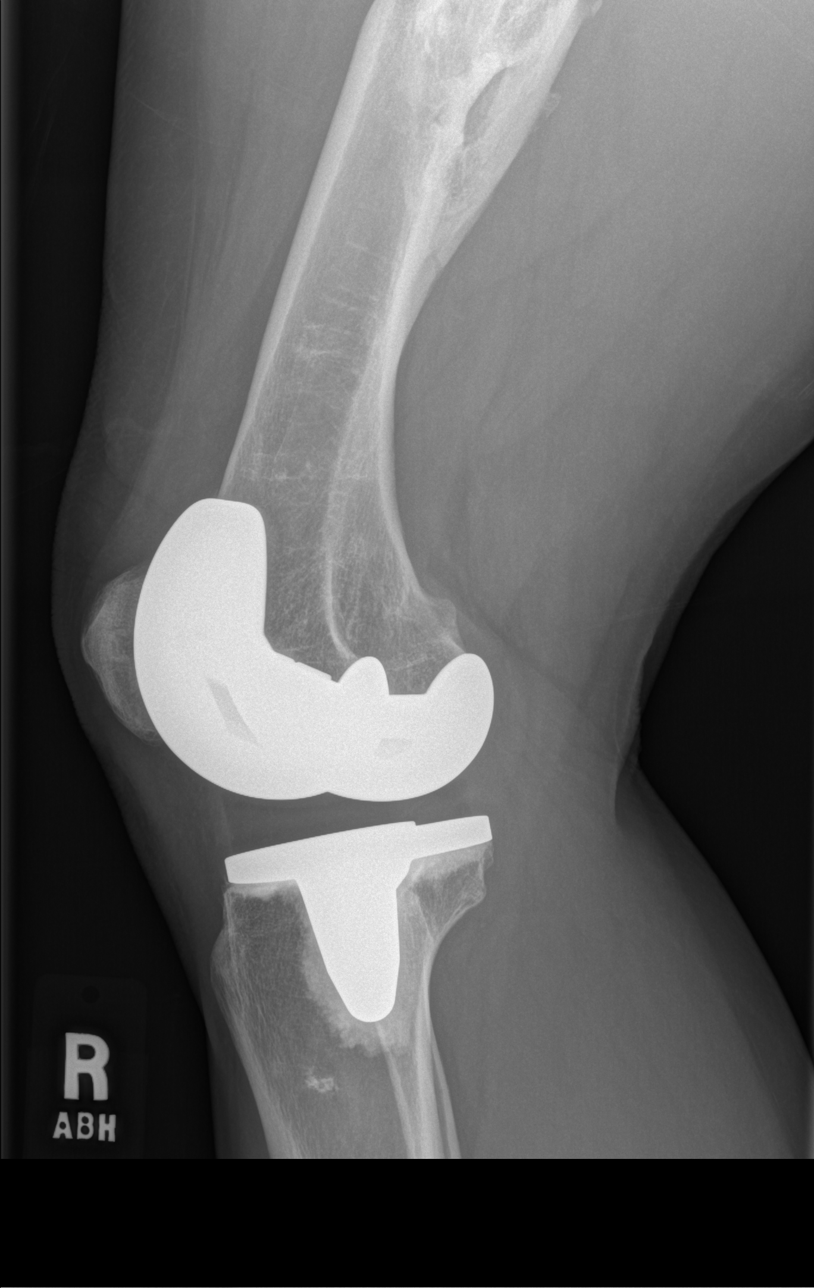
[im 5/5]
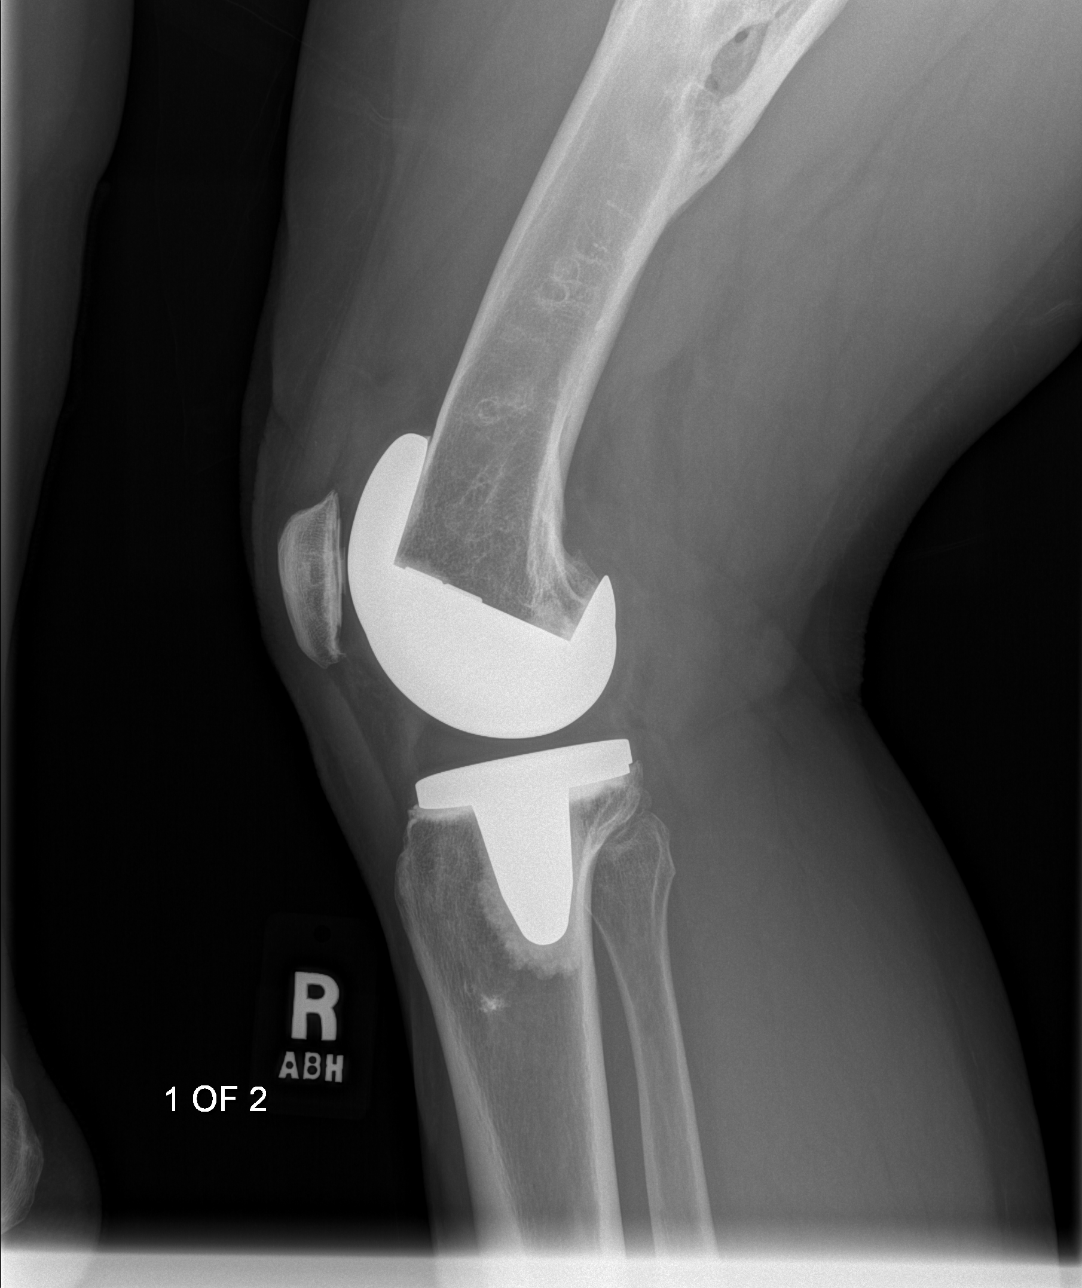

[5 of 5 positions shown; findings below may reference images not displayed]

FINDINGS: 4 views of the right knee demonstrates a total right knee arthroplasty
without evidence of hardware failure complication. There is no significant
joint effusion. There is no fracture or dislocation. The alignment is
anatomic.
IMPRESSION: Right total knee arthroplasty.

## 2012-04-12 ENCOUNTER — Ambulatory Visit (HOSPITAL_COMMUNITY): Payer: Medicaid Other | Attending: Family Medicine

## 2012-04-23 ENCOUNTER — Emergency Department (HOSPITAL_COMMUNITY)
Admission: EM | Admit: 2012-04-23 | Discharge: 2012-04-23 | Disposition: A | Discharge status: Home / Self Care | Payer: Medicaid Other | Attending: Emergency Medicine | Admitting: Emergency Medicine

## 2012-04-23 ENCOUNTER — Encounter (HOSPITAL_COMMUNITY): Payer: Self-pay | Admitting: Emergency Medicine

## 2012-04-23 DIAGNOSIS — J4489 Other specified chronic obstructive pulmonary disease: Secondary | ICD-10-CM | POA: Insufficient documentation

## 2012-04-23 DIAGNOSIS — G8929 Other chronic pain: Secondary | ICD-10-CM | POA: Insufficient documentation

## 2012-04-23 DIAGNOSIS — J449 Chronic obstructive pulmonary disease, unspecified: Secondary | ICD-10-CM | POA: Insufficient documentation

## 2012-04-23 DIAGNOSIS — F17200 Nicotine dependence, unspecified, uncomplicated: Secondary | ICD-10-CM | POA: Insufficient documentation

## 2012-04-23 DIAGNOSIS — L732 Hidradenitis suppurativa: Secondary | ICD-10-CM | POA: Insufficient documentation

## 2012-04-23 HISTORY — DX: Cutaneous abscess, unspecified: L02.91

## 2012-04-23 MED ORDER — OXYCODONE-ACETAMINOPHEN 5-325 MG PO TABS
1.0000 | ORAL_TABLET | Freq: Once | ORAL | Status: AC
  Administered 2012-04-23: 1 via ORAL
  Filled 2012-04-23: qty 1

## 2012-04-23 MED ORDER — MORPHINE SULFATE 4 MG/ML IJ SOLN
4.0000 mg | Freq: Once | INTRAMUSCULAR | Status: AC
  Administered 2012-04-23: 4 mg via INTRAMUSCULAR
  Filled 2012-04-23: qty 1

## 2012-04-23 MED ORDER — OXYCODONE-ACETAMINOPHEN 5-325 MG PO TABS: 2.0000 | ORAL_TABLET | ORAL | Status: AC | PRN
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
## 2012-04-23 NOTE — ED Notes (Signed)
Pt reports abscess under right arm noticed on Tuesday. Pt has history of same.

## 2012-05-06 ENCOUNTER — Other Ambulatory Visit: Payer: Self-pay | Admitting: Family Medicine

## 2012-05-06 ENCOUNTER — Ambulatory Visit: Payer: Medicaid Other | Admitting: Family Medicine

## 2012-05-09 ENCOUNTER — Emergency Department (HOSPITAL_COMMUNITY): Payer: Medicaid Other

## 2012-05-09 ENCOUNTER — Encounter (HOSPITAL_COMMUNITY): Payer: Self-pay | Admitting: *Deleted

## 2012-05-09 ENCOUNTER — Emergency Department (HOSPITAL_COMMUNITY)
Admission: EM | Admit: 2012-05-09 | Discharge: 2012-05-09 | Disposition: A | Discharge status: Home / Self Care | Payer: Medicaid Other | Attending: Emergency Medicine | Admitting: Emergency Medicine

## 2012-05-09 DIAGNOSIS — S838X9A Sprain of other specified parts of unspecified knee, initial encounter: Secondary | ICD-10-CM | POA: Insufficient documentation

## 2012-05-09 DIAGNOSIS — Z79899 Other long term (current) drug therapy: Secondary | ICD-10-CM | POA: Insufficient documentation

## 2012-05-09 DIAGNOSIS — S86819A Strain of other muscle(s) and tendon(s) at lower leg level, unspecified leg, initial encounter: Secondary | ICD-10-CM | POA: Insufficient documentation

## 2012-05-09 DIAGNOSIS — G8929 Other chronic pain: Secondary | ICD-10-CM | POA: Insufficient documentation

## 2012-05-09 DIAGNOSIS — J4489 Other specified chronic obstructive pulmonary disease: Secondary | ICD-10-CM | POA: Insufficient documentation

## 2012-05-09 DIAGNOSIS — W010XXA Fall on same level from slipping, tripping and stumbling without subsequent striking against object, initial encounter: Secondary | ICD-10-CM | POA: Insufficient documentation

## 2012-05-09 DIAGNOSIS — S86919A Strain of unspecified muscle(s) and tendon(s) at lower leg level, unspecified leg, initial encounter: Secondary | ICD-10-CM

## 2012-05-09 DIAGNOSIS — Y92009 Unspecified place in unspecified non-institutional (private) residence as the place of occurrence of the external cause: Secondary | ICD-10-CM | POA: Insufficient documentation

## 2012-05-09 DIAGNOSIS — J449 Chronic obstructive pulmonary disease, unspecified: Secondary | ICD-10-CM | POA: Insufficient documentation

## 2012-05-09 DIAGNOSIS — F17200 Nicotine dependence, unspecified, uncomplicated: Secondary | ICD-10-CM | POA: Insufficient documentation

## 2012-05-09 MED ORDER — ONDANSETRON 4 MG PO TBDP
ORAL_TABLET | ORAL | Status: AC
  Filled 2012-05-09: qty 2

## 2012-05-09 MED ORDER — HYDROCODONE-ACETAMINOPHEN 5-325 MG PO TABS
2.0000 | ORAL_TABLET | Freq: Once | ORAL | Status: AC
  Administered 2012-05-09: 2 via ORAL
  Filled 2012-05-09: qty 2

## 2012-05-09 MED ORDER — ONDANSETRON 4 MG PO TBDP
8.0000 mg | ORAL_TABLET | Freq: Once | ORAL | Status: AC
  Administered 2012-05-09: 8 mg via ORAL

## 2012-05-09 MED ORDER — HYDROCODONE-ACETAMINOPHEN 5-325 MG PO TABS: 2.0000 | ORAL_TABLET | ORAL | Status: AC | PRN

## 2012-05-09 NOTE — ED Notes (Signed)
She slipped and fell last pm onto her lt knee.  She felt something pop .  She also has pain in her lt hip.  She has been walkiing on that

## 2012-05-09 NOTE — ED Notes (Signed)
Pt called x1, no reply

## 2012-05-09 NOTE — Progress Notes (Signed)
Orthopedic Tech Progress Note Patient Details:  Jamie Schultz September 18, 1960 [id number]  Ortho Devices Type of Ortho Device: Knee Immobilizer;Crutches Ortho Device/Splint Location: LEFET KNEE Ortho Device/Splint Interventions: Application   Nikki Dom 05/09/2012, 6:51 PM
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
## 2012-05-09 NOTE — ED Notes (Signed)
Ortho called for crutches and immobilizer 

## 2012-05-23 ENCOUNTER — Emergency Department: Payer: Self-pay | Admitting: Emergency Medicine

## 2012-05-23 LAB — URINALYSIS, COMPLETE
Bilirubin,UR: NEGATIVE
Glucose,UR: NEGATIVE mg/dL (ref 0–75)
Leukocyte Esterase: NEGATIVE
Protein: NEGATIVE
RBC,UR: <1 /HPF (ref 0–5)
Specific Gravity: 1.024 (ref 1.003–1.030)

## 2012-05-23 LAB — BASIC METABOLIC PANEL
Calcium, Total: 10.2 mg/dL — ABNORMAL HIGH (ref 8.5–10.1)
Chloride: 106 mmol/L (ref 98–107)
Co2: 24 mmol/L (ref 21–32)
Potassium: 4.5 mmol/L (ref 3.5–5.1)
Sodium: 139 mmol/L (ref 136–145)

## 2012-05-26 ENCOUNTER — Emergency Department: Payer: Self-pay | Admitting: Emergency Medicine

## 2012-05-26 LAB — CBC
HCT: 41.5 % (ref 35.0–47.0)
MCHC: 33.4 g/dL (ref 32.0–36.0)
RDW: 13.7 % (ref 11.5–14.5)
WBC: 11.8 10*3/uL — ABNORMAL HIGH (ref 3.6–11.0)

## 2012-05-26 LAB — BASIC METABOLIC PANEL
BUN: 19 mg/dL — ABNORMAL HIGH (ref 7–18)
Chloride: 106 mmol/L (ref 98–107)
Co2: 24 mmol/L (ref 21–32)
Creatinine: 1.24 mg/dL (ref 0.60–1.30)
EGFR (African American): 57 — ABNORMAL LOW
Sodium: 138 mmol/L (ref 136–145)

## 2012-05-27 ENCOUNTER — Emergency Department: Payer: Self-pay | Admitting: Emergency Medicine

## 2012-05-29 ENCOUNTER — Emergency Department: Payer: Self-pay | Admitting: Emergency Medicine

## 2012-05-29 ENCOUNTER — Encounter (HOSPITAL_COMMUNITY): Payer: Self-pay | Admitting: Emergency Medicine

## 2012-05-29 ENCOUNTER — Emergency Department (HOSPITAL_COMMUNITY)
Admission: EM | Admit: 2012-05-29 | Discharge: 2012-05-29 | Discharge status: Against Medical Advice | Payer: Medicaid Other | Attending: Emergency Medicine | Admitting: Emergency Medicine

## 2012-05-29 DIAGNOSIS — M542 Cervicalgia: Secondary | ICD-10-CM | POA: Insufficient documentation

## 2012-05-29 DIAGNOSIS — R51 Headache: Secondary | ICD-10-CM | POA: Insufficient documentation

## 2012-05-29 MED ORDER — ONDANSETRON 4 MG PO TBDP
8.0000 mg | ORAL_TABLET | Freq: Once | ORAL | Status: AC
  Administered 2012-05-29: 8 mg via ORAL
  Filled 2012-05-29: qty 2

## 2012-05-29 NOTE — ED Notes (Signed)
Pt c/o n/v
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
## 2012-06-25 ENCOUNTER — Emergency Department: Payer: Self-pay | Admitting: Emergency Medicine

## 2012-06-30 IMAGING — CR DG FOREARM 2V*L*
1 series · 2 of 2 positions shown · non-contrast
Comparison: none

REASON FOR EXAM: fall/pain
COMMENTS:

PROCEDURE:     DXR - DXR FOREARM LEFT  - October 08, 2011  [DATE]
RESULT:     No fracture, dislocation or other acute bony abnormality is
identified.

[Series 1: ap · 0.17mm/px · 2 of 2 slices shown]
[im 1/2]
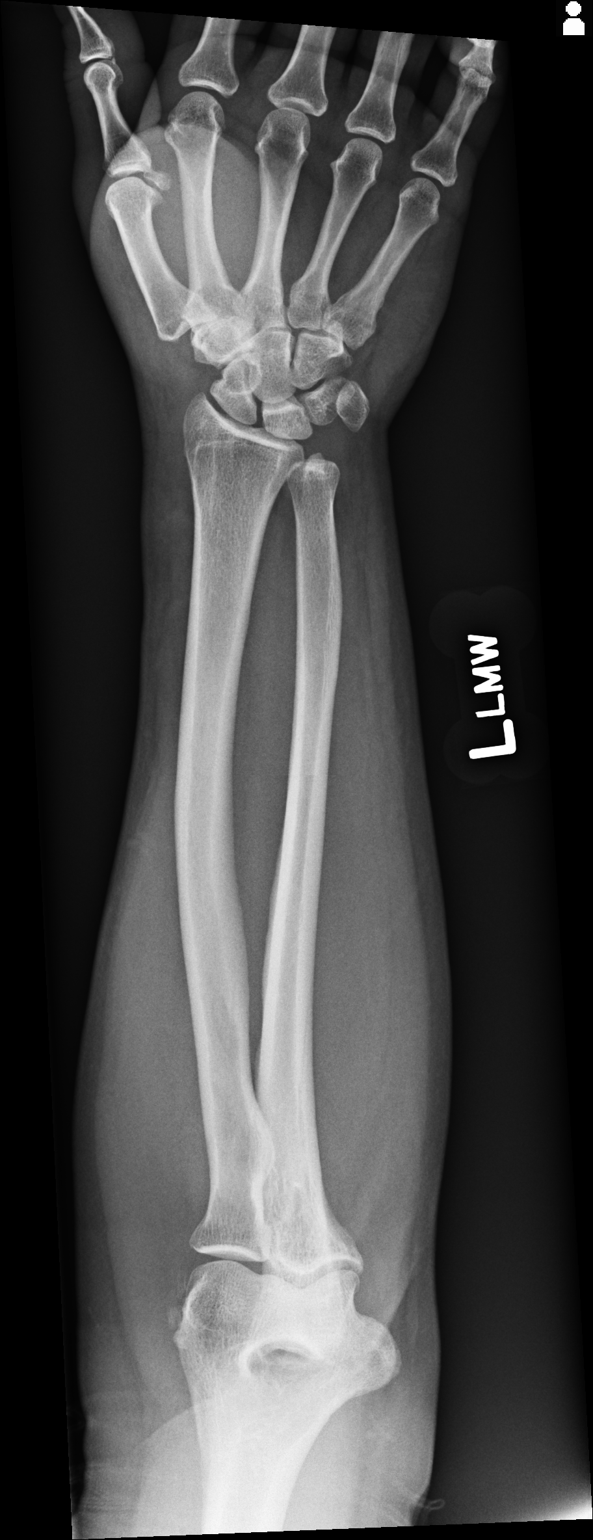
[im 2/2]
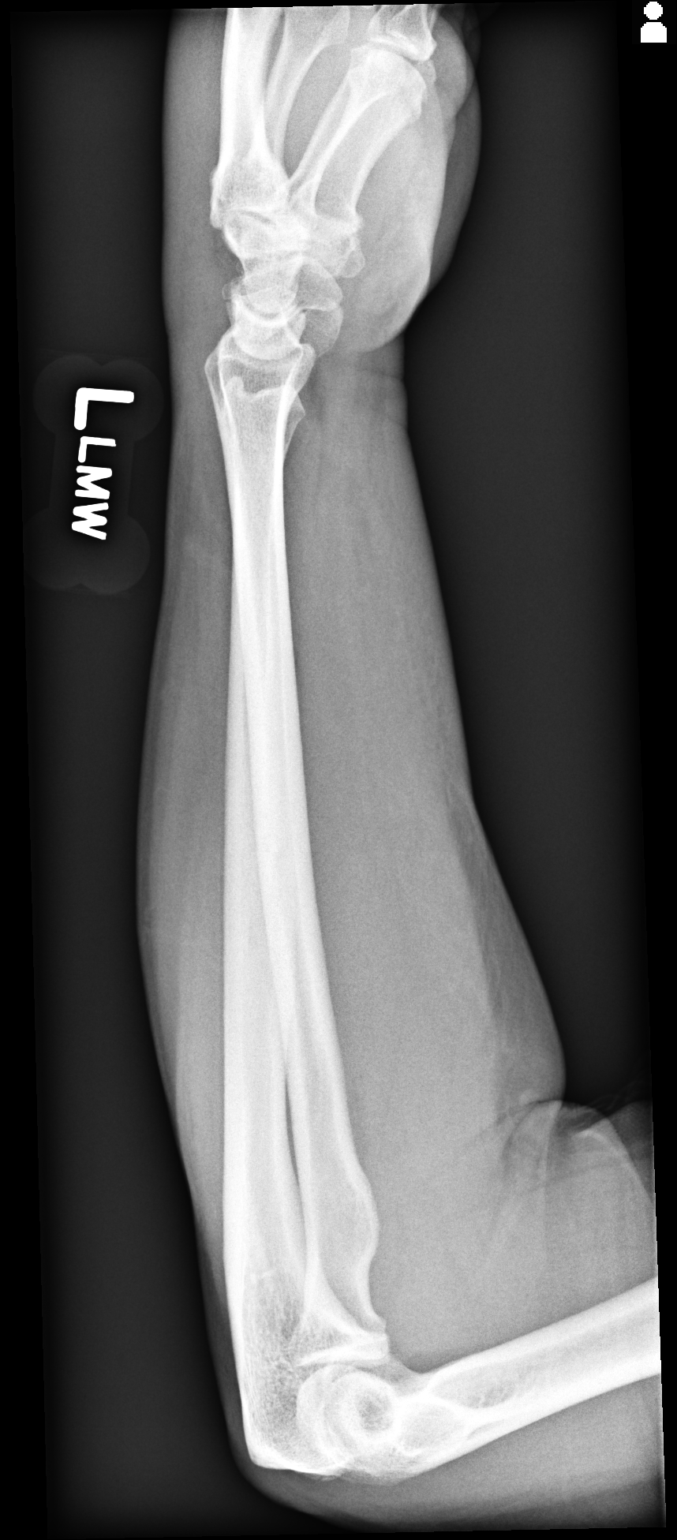

[2 of 2 positions shown; findings below may reference images not displayed]

IMPRESSION: No significant osseous abnormalities are noted.

## 2012-06-30 IMAGING — CR CERVICAL SPINE - COMPLETE 4+ VIEW
1 series · 5 of 5 positions shown · non-contrast
Comparison: none

REASON FOR EXAM: fall/ pain
COMMENTS:

[Series 1: lat · 0.17mm/px · 5 of 5 slices shown]
[im 1/5]
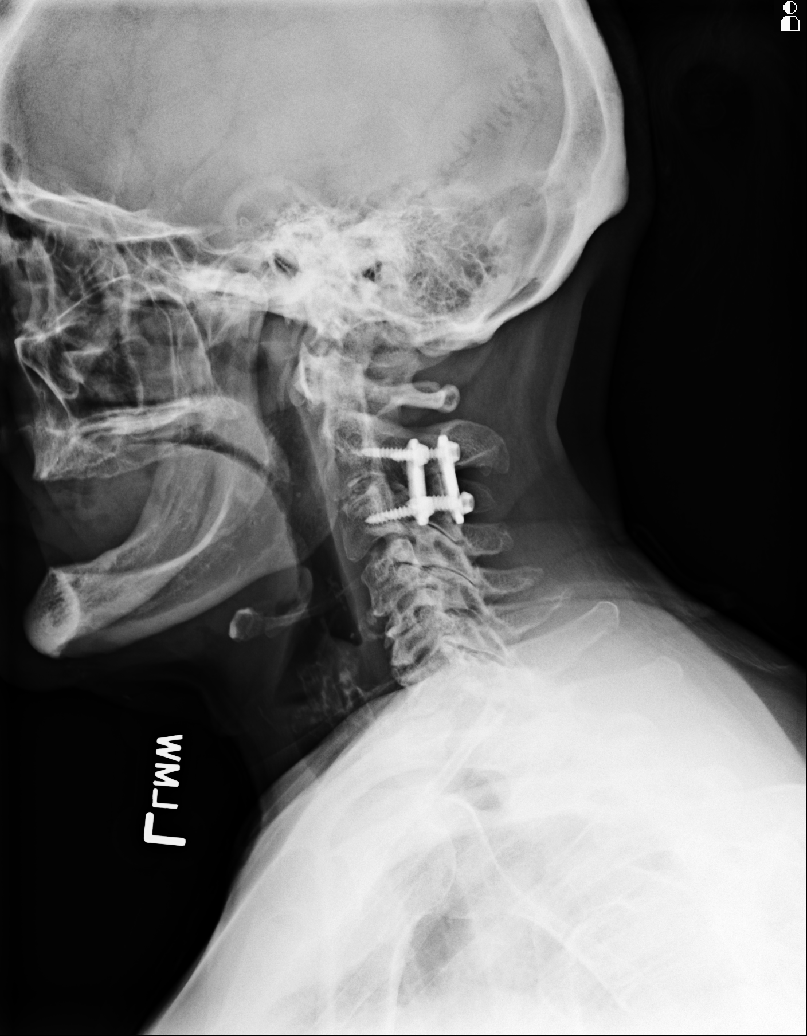
[im 2/5]
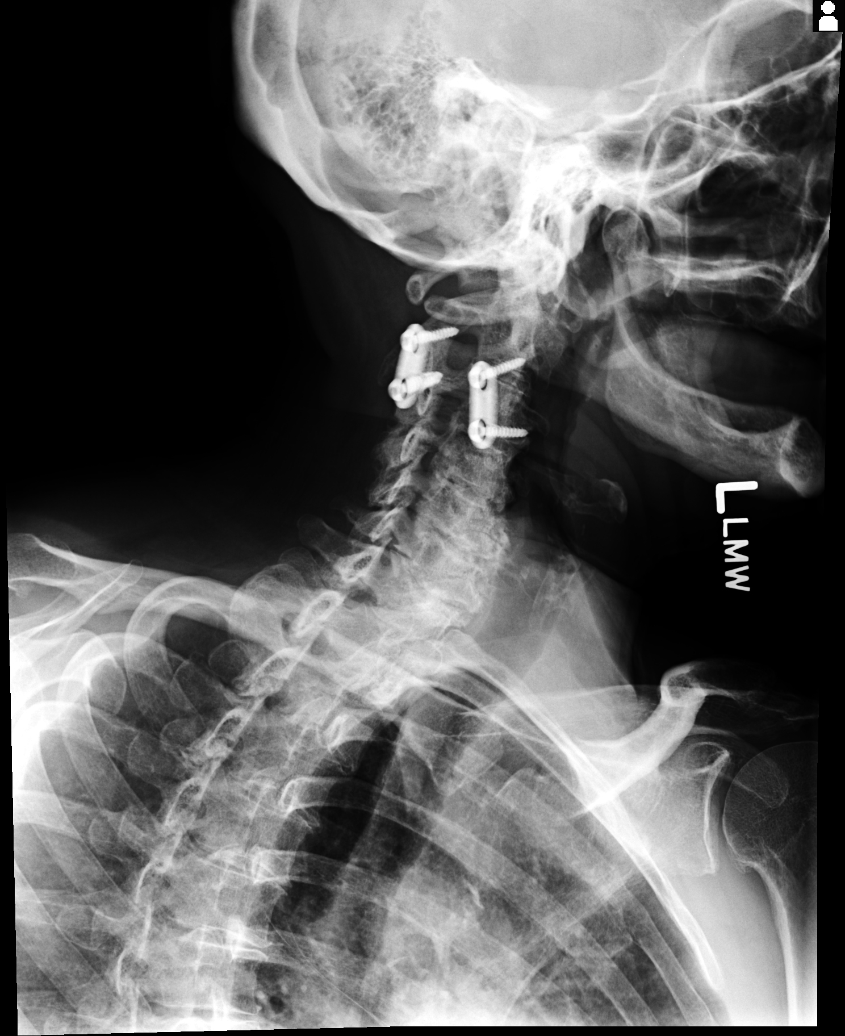
[im 3/5]
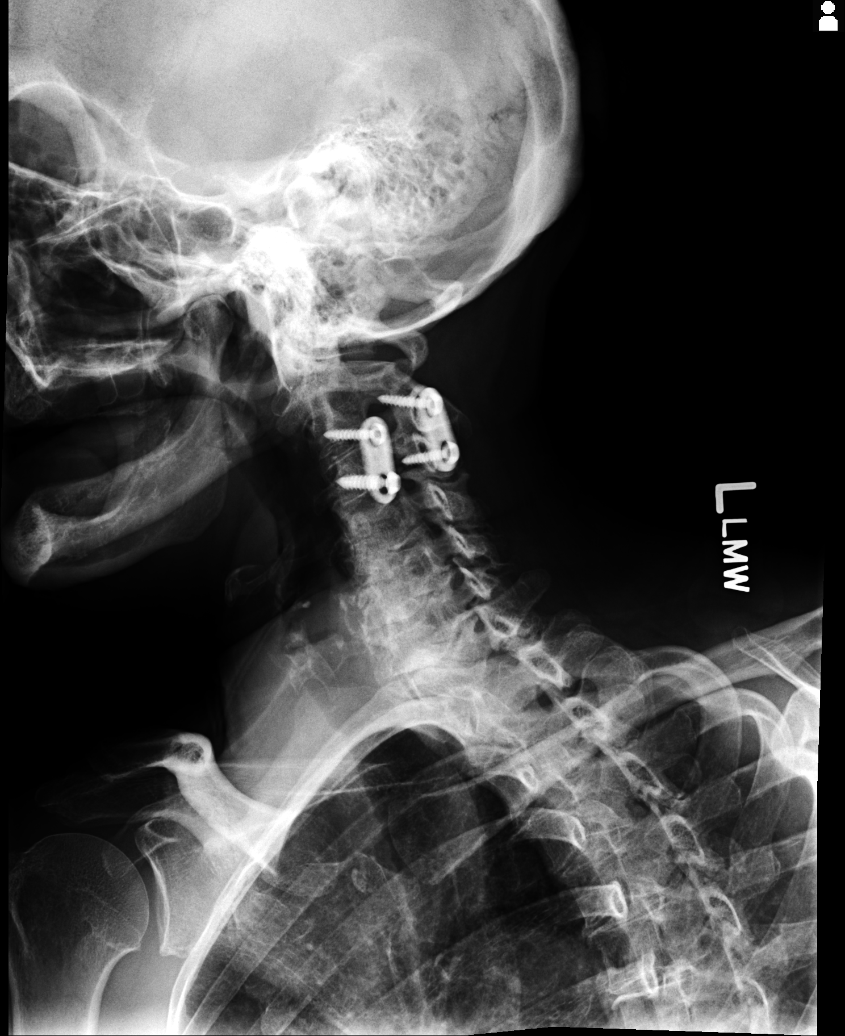
[im 4/5]
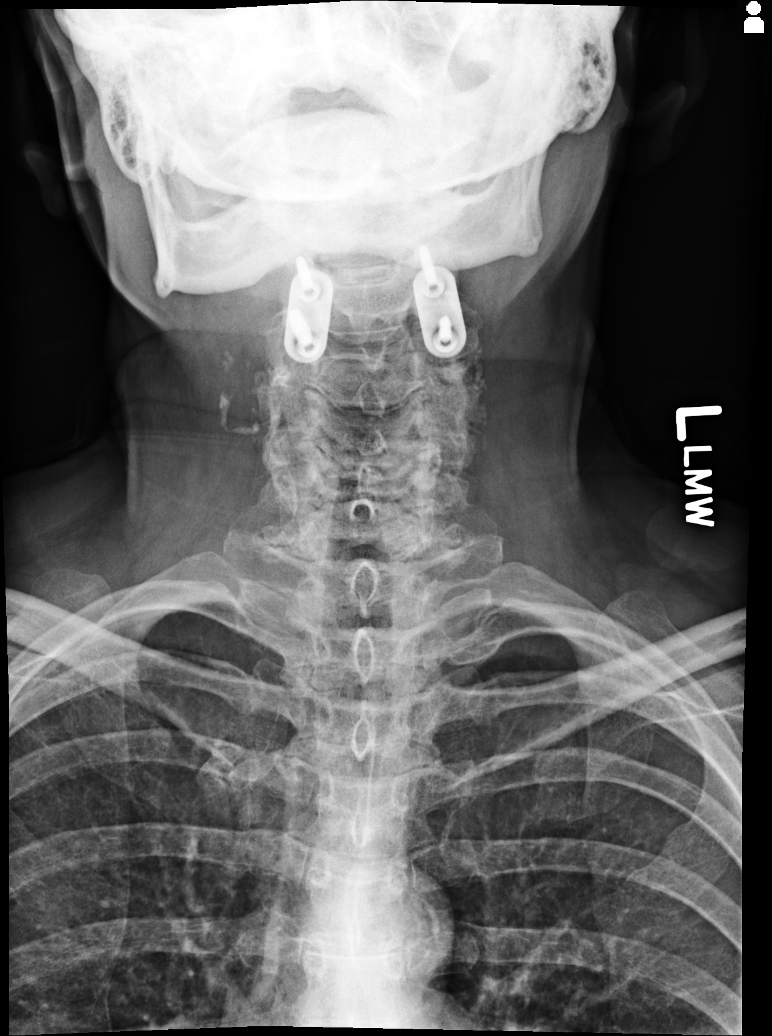
[im 5/5]
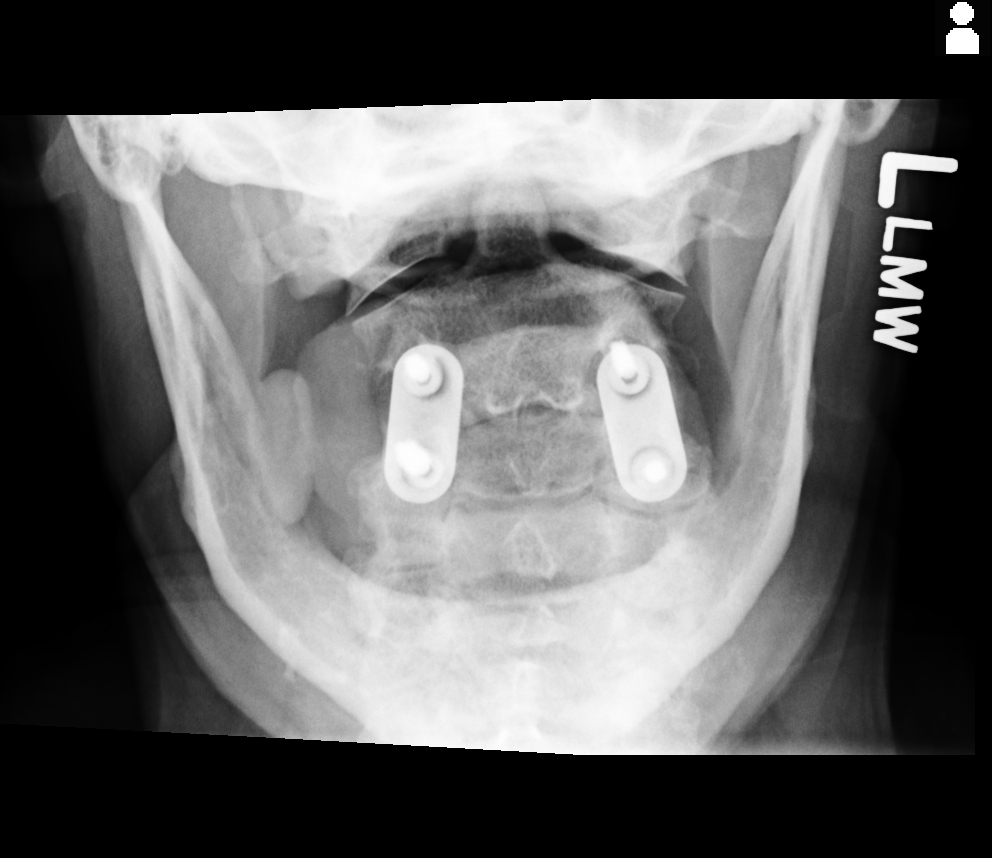

[5 of 5 positions shown; findings below may reference images not displayed]

PROCEDURE:     DXR - DXR CERVICAL SPINE COMPLETE  - October 08, 2011  [DATE]

RESULT:     The vertebral body heights are well-maintained. No fracture is
seen. The C7 cervical vertebral body is not well seen in the lateral view
but is visualized in both oblique views with no fracture evident. There is
spur impingement on the neural foramina at C4-C5 and C5-C6 bilaterally.
There is narrowing of the C5-C6 cervical disc space compatible with cervical
disc disease. Postsurgical changes are noted at C2-C3 where there are
observed posterior stabilizing bars with pedicle screws. The hardware is
intact.
IMPRESSION: 1. No acute bony abnormalities are seen.
2. There is narrowing of the C5-C6 cervical disc space compatible with
cervical disc disease.
3. There is spur impingement on the neural foramina bilaterally at C4-C5 and
C5-C6.
4. Postoperative changes are noted at the C2-C3 level. The hardware remains
intact.

## 2012-06-30 IMAGING — CR DG HAND COMPLETE 3+V*L*
1 series · 3 of 3 positions shown · non-contrast
Comparison: none

REASON FOR EXAM: fall/pain
COMMENTS:

PROCEDURE:     DXR - DXR HAND LT COMPLETE  W/OBLIQUES  - October 08, 2011  [DATE]
RESULT:     No fracture, dislocation or other acute bony abnormality is
identified.

[Series 1: pa · 0.17mm/px · 3 of 3 slices shown]
[im 1/3]
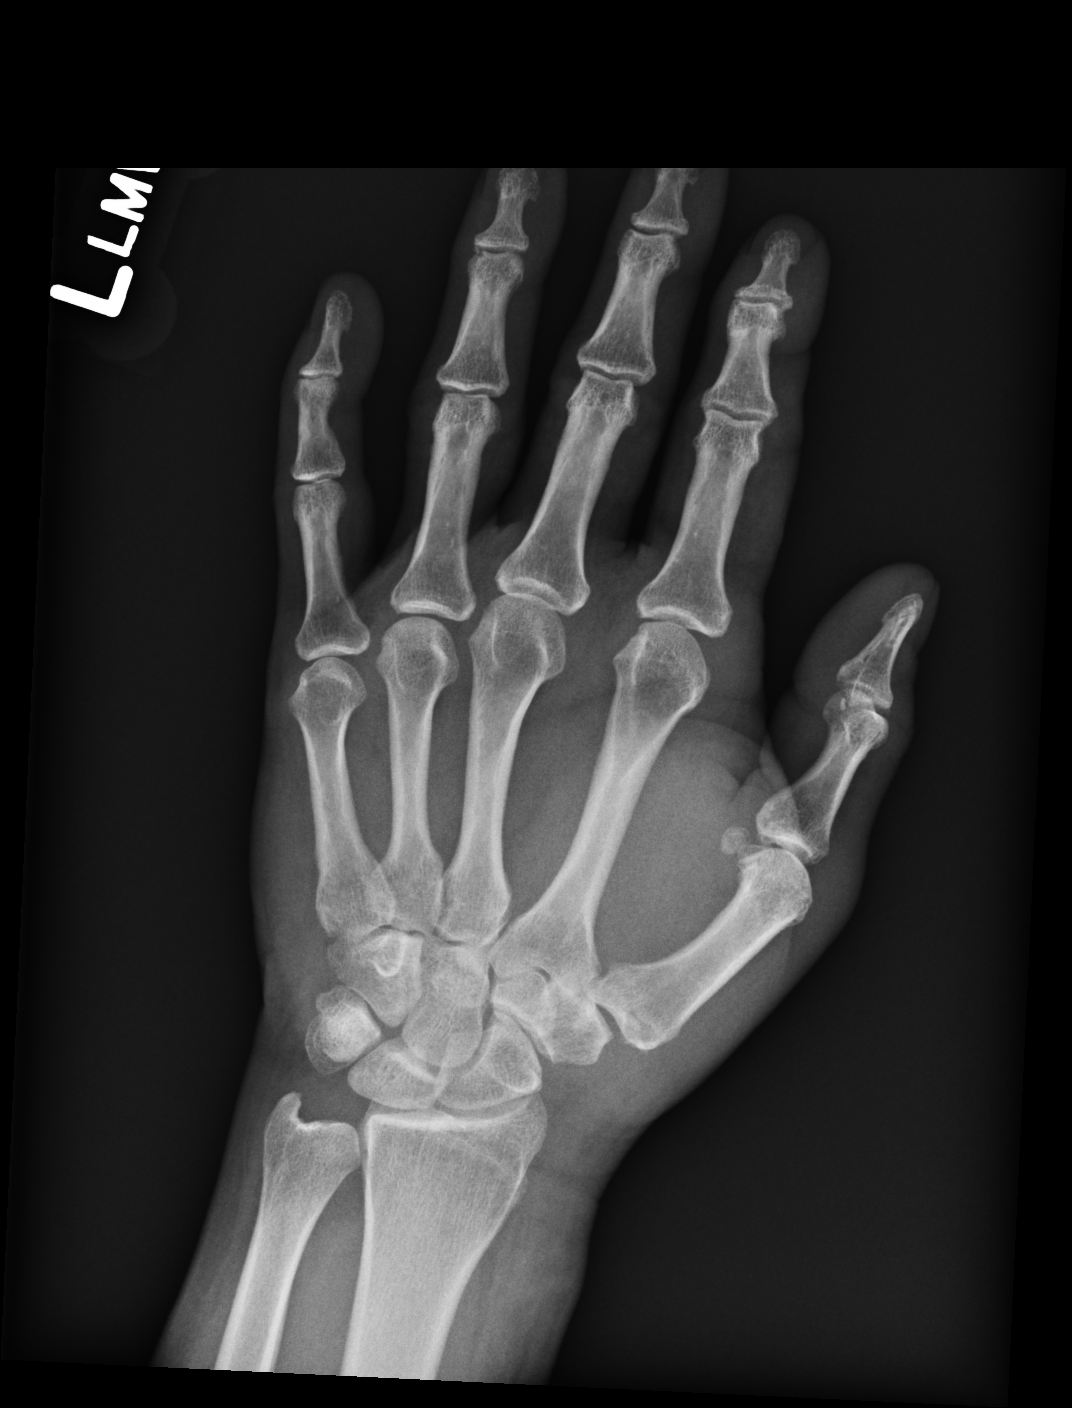
[im 2/3]
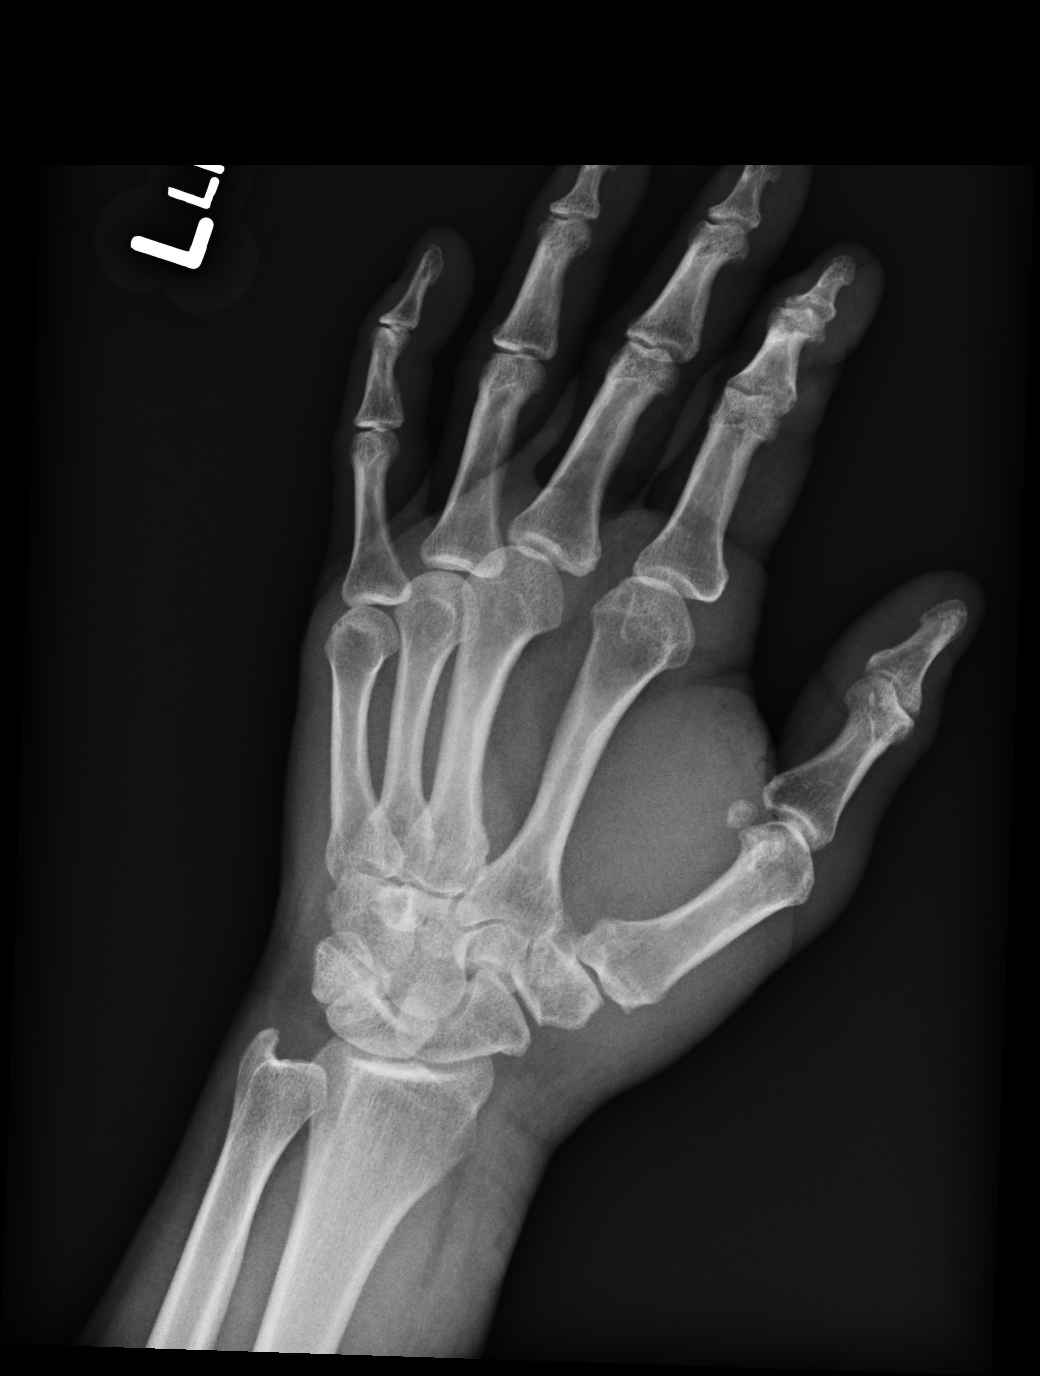
[im 3/3]
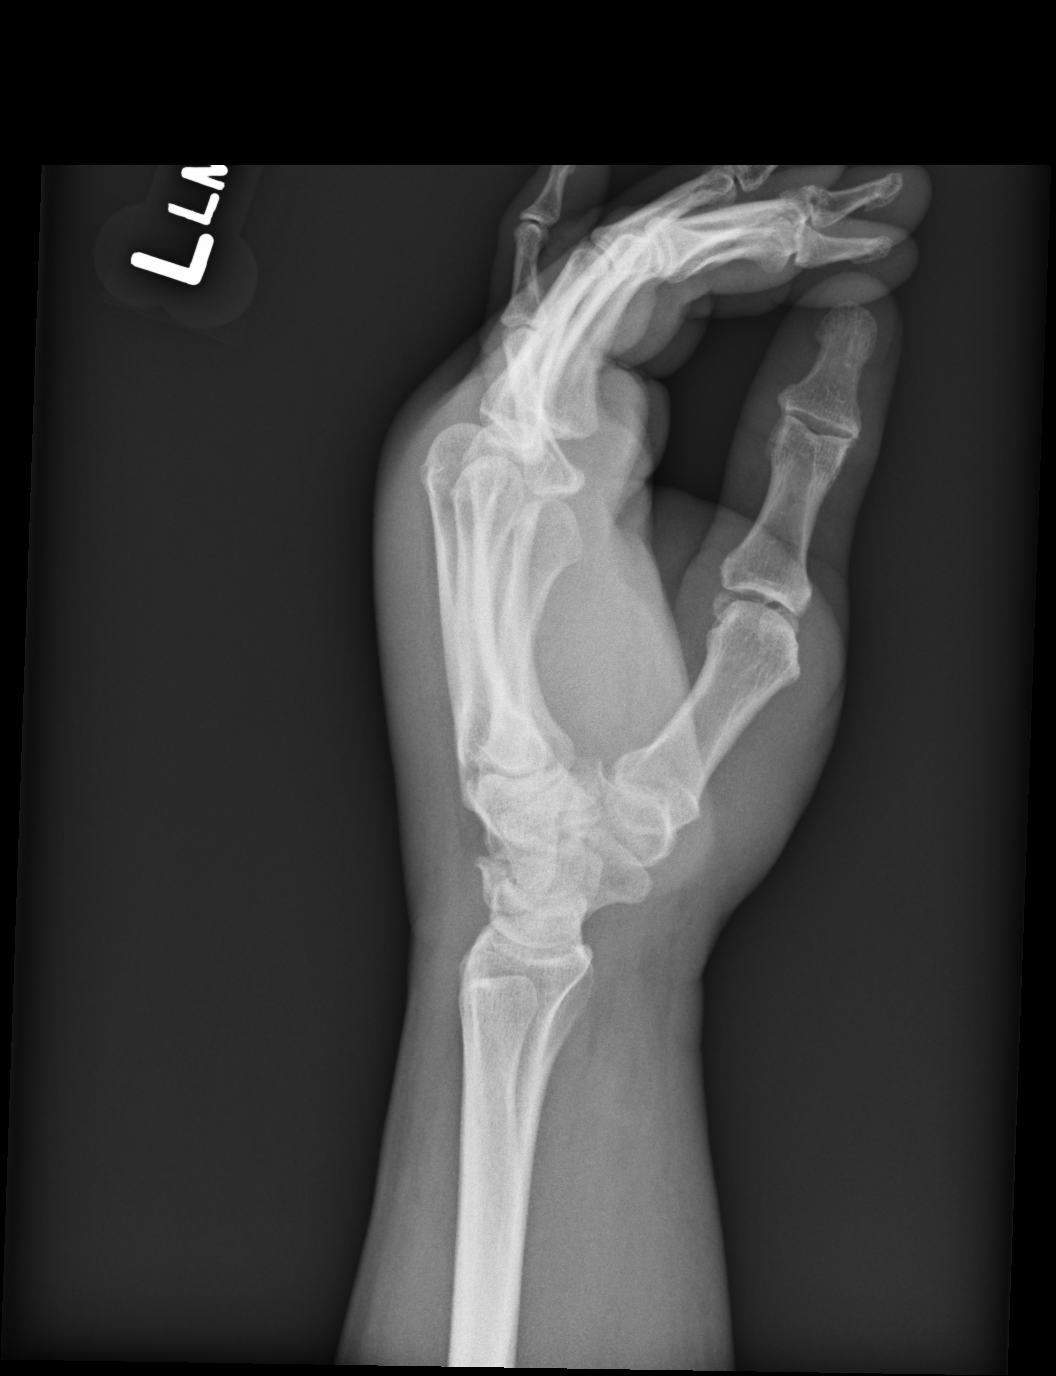

[3 of 3 positions shown; findings below may reference images not displayed]

IMPRESSION: 1. No acute changes are identified.
2. Although not mentioned above, there is observed slight arthritic spurring
at the DIP joints of the second through the fifth fingers.

## 2012-07-01 IMAGING — CR DG CHEST 2V
1 series · 2 of 2 positions shown · non-contrast
Comparison: none

REASON FOR EXAM: fall back pain
COMMENTS:

[Series 1: pa · 0.17mm/px · 2 of 2 slices shown]
[im 1/2]
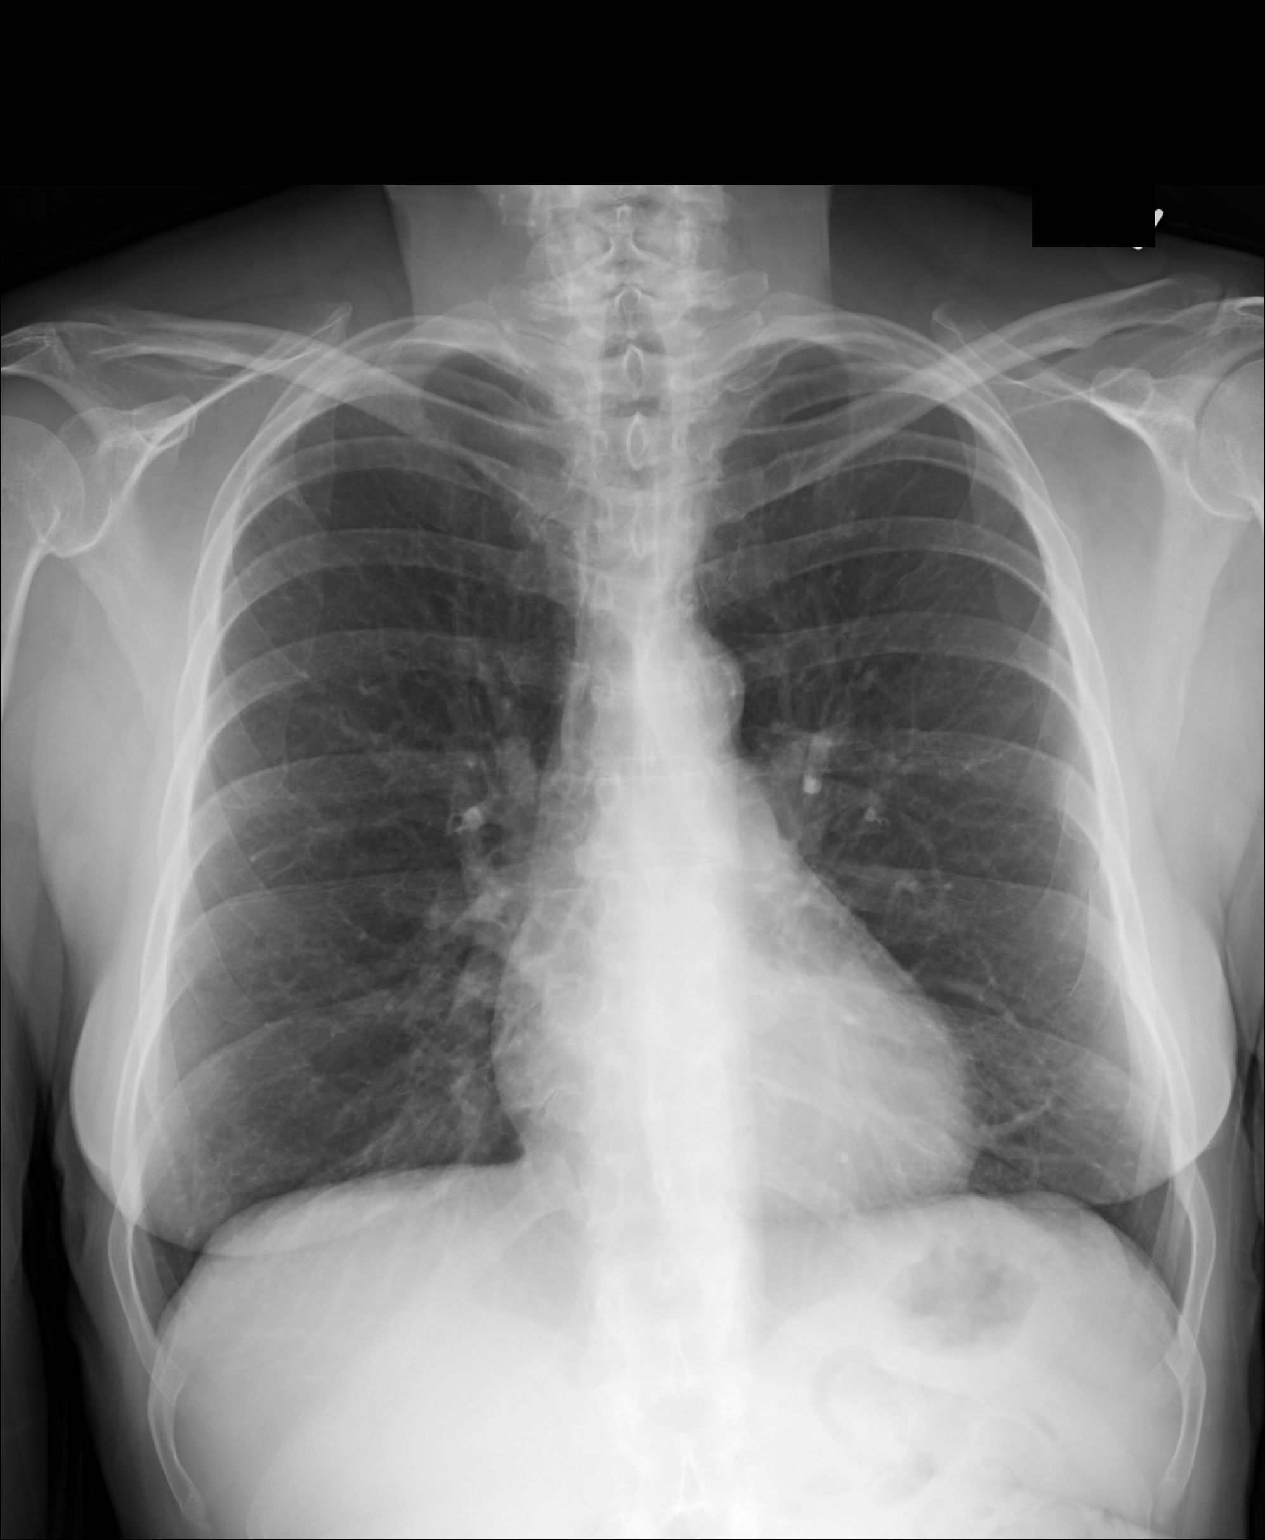
[im 2/2]
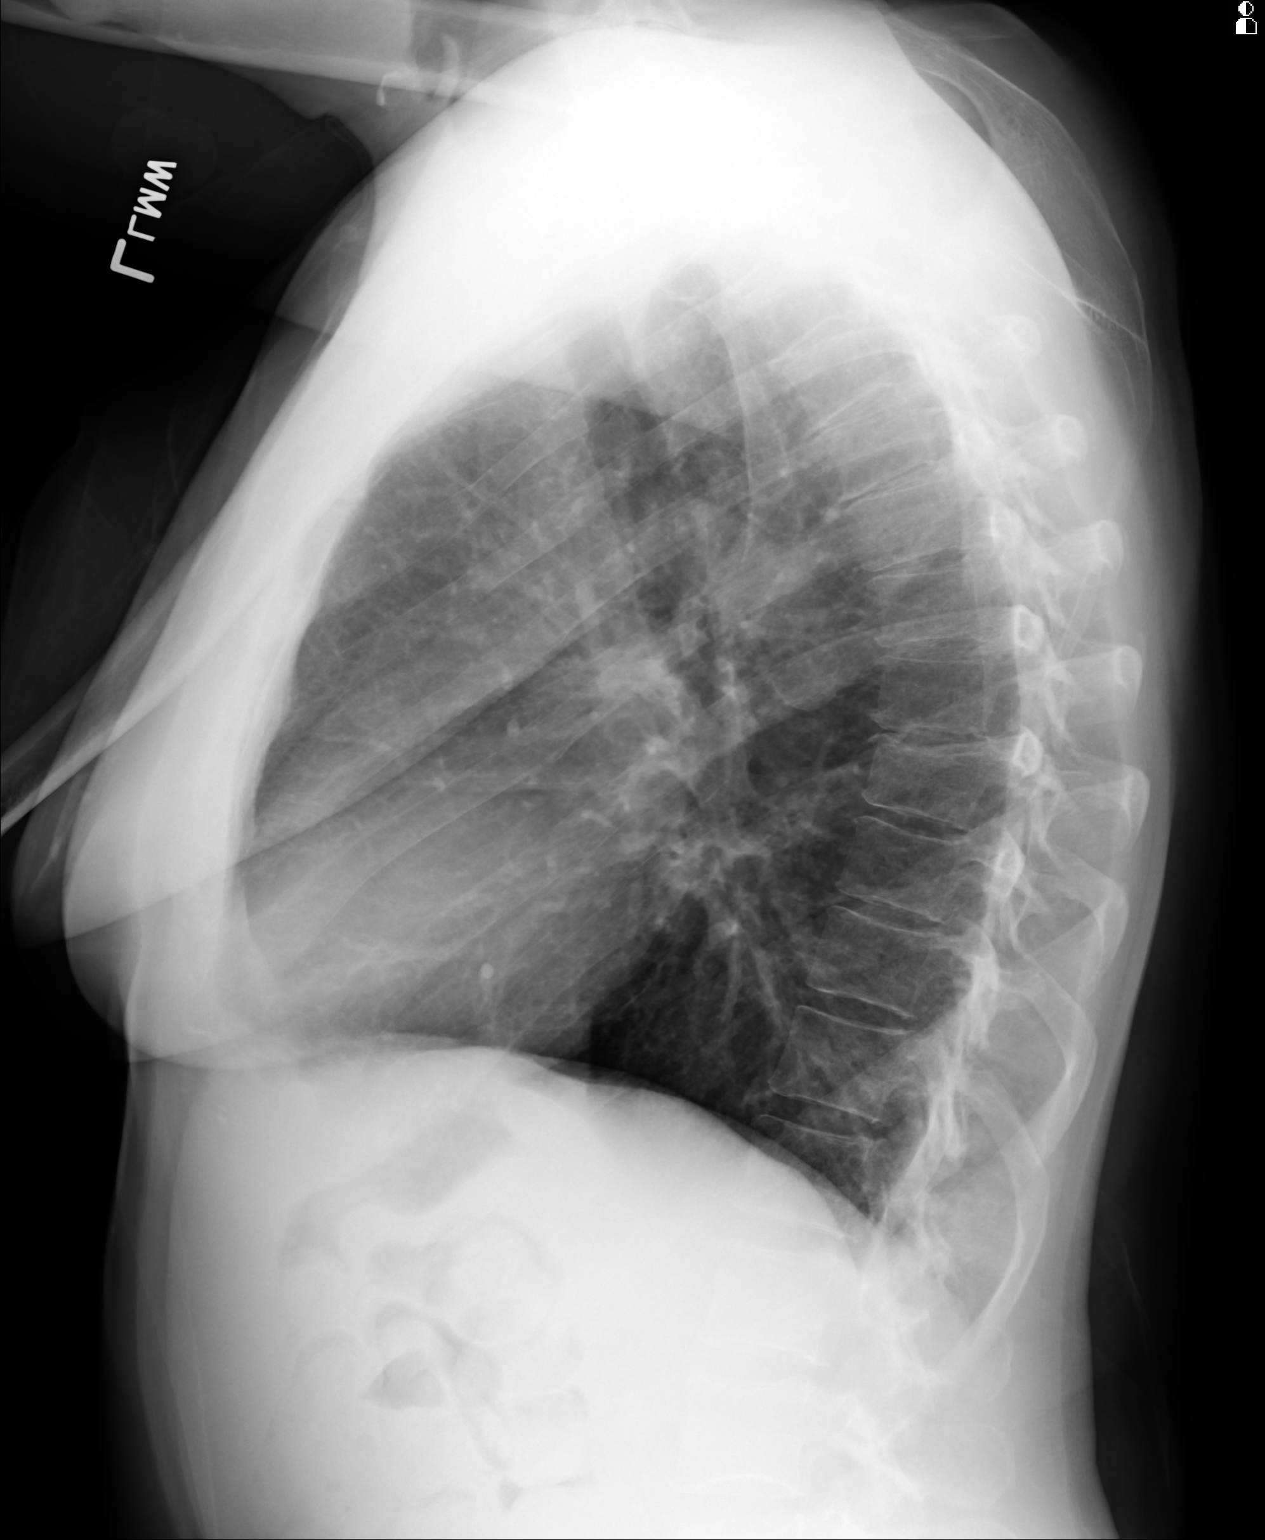

[2 of 2 positions shown; findings below may reference images not displayed]

PROCEDURE:     DXR - DXR CHEST PA (OR AP) AND LATERAL  - October 09, 2011  [DATE]

RESULT:     Comparison is made to the prior exam of 03/23/2011. The lung
fields are clear of infiltrate. No pneumonia, pneumothorax or pleural
effusion is seen. Heart size is normal. The chest appears hyperinflated
bilaterally, suspicious for a history of COPD or asthma. The mediastinal and
osseous structures are normal in appearance.
IMPRESSION: 1. The lung fields are clear.
2. The chest appears bilaterally hyperinflated.

## 2012-07-21 ENCOUNTER — Emergency Department: Payer: Self-pay | Admitting: Emergency Medicine

## 2012-08-15 ENCOUNTER — Inpatient Hospital Stay: Payer: Self-pay | Admitting: Internal Medicine

## 2012-08-15 LAB — CBC WITH DIFFERENTIAL/PLATELET
Basophil %: 0.5 %
Eosinophil %: 0 %
HCT: 38.7 % (ref 35.0–47.0)
HGB: 12.9 g/dL (ref 12.0–16.0)
Lymphocyte #: 0.7 10*3/uL — ABNORMAL LOW (ref 1.0–3.6)
Lymphocyte %: 3.4 %
MCV: 91 fL (ref 80–100)
Monocyte %: 5 %
Neutrophil #: 19.1 10*3/uL — ABNORMAL HIGH (ref 1.4–6.5)

## 2012-08-15 LAB — COMPREHENSIVE METABOLIC PANEL
Albumin: 3.2 g/dL — ABNORMAL LOW (ref 3.4–5.0)
Anion Gap: 7 (ref 7–16)
Calcium, Total: 9.5 mg/dL (ref 8.5–10.1)
Chloride: 105 mmol/L (ref 98–107)
Co2: 24 mmol/L (ref 21–32)
Glucose: 168 mg/dL — ABNORMAL HIGH (ref 65–99)
Osmolality: 277 (ref 275–301)
Potassium: 3.6 mmol/L (ref 3.5–5.1)
Sodium: 136 mmol/L (ref 136–145)

## 2012-08-15 LAB — TROPONIN I: Troponin-I: 0.1 ng/mL — ABNORMAL HIGH

## 2012-08-16 LAB — COMPREHENSIVE METABOLIC PANEL
Bilirubin,Total: 0.2 mg/dL (ref 0.2–1.0)
Chloride: 110 mmol/L — ABNORMAL HIGH (ref 98–107)
Co2: 22 mmol/L (ref 21–32)
EGFR (African American): 46 — ABNORMAL LOW
EGFR (Non-African Amer.): 40 — ABNORMAL LOW
SGOT(AST): 28 U/L (ref 15–37)
SGPT (ALT): 18 U/L (ref 12–78)

## 2012-08-16 LAB — CK TOTAL AND CKMB (NOT AT ARMC): CK-MB: 4.9 ng/mL — ABNORMAL HIGH (ref 0.5–3.6)

## 2012-08-16 LAB — PRO B NATRIURETIC PEPTIDE: B-Type Natriuretic Peptide: 2070 pg/mL — ABNORMAL HIGH (ref 0–125)

## 2012-08-16 LAB — CBC WITH DIFFERENTIAL/PLATELET
Basophil %: 0.3 %
Eosinophil #: 0 10*3/uL (ref 0.0–0.7)
Eosinophil %: 0 %
HCT: 34 % — ABNORMAL LOW (ref 35.0–47.0)
HGB: 11.3 g/dL — ABNORMAL LOW (ref 12.0–16.0)
Lymphocyte #: 0.4 10*3/uL — ABNORMAL LOW (ref 1.0–3.6)
MCH: 30.4 pg (ref 26.0–34.0)
MCHC: 33.2 g/dL (ref 32.0–36.0)
MCV: 92 fL (ref 80–100)
Monocyte #: 0.4 x10 3/mm (ref 0.2–0.9)
Neutrophil #: 21.5 10*3/uL — ABNORMAL HIGH (ref 1.4–6.5)
Neutrophil %: 96.4 %
RBC: 3.71 10*6/uL — ABNORMAL LOW (ref 3.80–5.20)

## 2012-08-16 LAB — TROPONIN I: Troponin-I: 0.03 ng/mL

## 2012-08-16 LAB — RAPID INFLUENZA A&B ANTIGENS

## 2012-08-16 LAB — T4, FREE: Free Thyroxine: 0.64 ng/dL — ABNORMAL LOW (ref 0.76–1.46)

## 2012-08-16 LAB — MAGNESIUM: Magnesium: 1.9 mg/dL

## 2012-08-17 LAB — BASIC METABOLIC PANEL
Chloride: 113 mmol/L — ABNORMAL HIGH (ref 98–107)
Co2: 22 mmol/L (ref 21–32)
Creatinine: 0.96 mg/dL (ref 0.60–1.30)
Potassium: 4.2 mmol/L (ref 3.5–5.1)
Sodium: 140 mmol/L (ref 136–145)

## 2012-08-17 LAB — CBC WITH DIFFERENTIAL/PLATELET
Basophil #: 0 10*3/uL (ref 0.0–0.1)
Eosinophil #: 0 10*3/uL (ref 0.0–0.7)
MCH: 30 pg (ref 26.0–34.0)
MCHC: 32.8 g/dL (ref 32.0–36.0)
Monocyte #: 0.9 x10 3/mm (ref 0.2–0.9)
Neutrophil %: 95 %
Platelet: 264 10*3/uL (ref 150–440)
RDW: 14.2 % (ref 11.5–14.5)

## 2012-08-18 LAB — CBC WITH DIFFERENTIAL/PLATELET
Basophil #: 0 10*3/uL (ref 0.0–0.1)
Eosinophil #: 0 10*3/uL (ref 0.0–0.7)
HCT: 35.9 % (ref 35.0–47.0)
HGB: 11.9 g/dL — ABNORMAL LOW (ref 12.0–16.0)
Lymphocyte %: 1.9 %
Monocyte #: 0.6 x10 3/mm (ref 0.2–0.9)
Neutrophil #: 19.1 10*3/uL — ABNORMAL HIGH (ref 1.4–6.5)
Neutrophil %: 95 %
RBC: 3.9 10*6/uL (ref 3.80–5.20)
RDW: 14.3 % (ref 11.5–14.5)
WBC: 20.1 10*3/uL — ABNORMAL HIGH (ref 3.6–11.0)

## 2012-08-18 LAB — COMPREHENSIVE METABOLIC PANEL
Albumin: 2.8 g/dL — ABNORMAL LOW (ref 3.4–5.0)
Alkaline Phosphatase: 90 U/L (ref 50–136)
Anion Gap: 4 — ABNORMAL LOW (ref 7–16)
BUN: 33 mg/dL — ABNORMAL HIGH (ref 7–18)
Bilirubin,Total: 0.2 mg/dL (ref 0.2–1.0)
Calcium, Total: 9.8 mg/dL (ref 8.5–10.1)
Chloride: 110 mmol/L — ABNORMAL HIGH (ref 98–107)
Co2: 26 mmol/L (ref 21–32)
Creatinine: 0.95 mg/dL (ref 0.60–1.30)
EGFR (African American): >60
EGFR (Non-African Amer.): >60
Glucose: 160 mg/dL — ABNORMAL HIGH (ref 65–99)
Osmolality: 290 (ref 275–301)
Potassium: 5 mmol/L (ref 3.5–5.1)
SGOT(AST): 28 U/L (ref 15–37)
SGPT (ALT): 31 U/L (ref 12–78)
Sodium: 140 mmol/L (ref 136–145)
Total Protein: 6.8 g/dL (ref 6.4–8.2)

## 2012-08-19 LAB — CBC WITH DIFFERENTIAL/PLATELET
Basophil #: 0 10*3/uL (ref 0.0–0.1)
Basophil %: 0 %
Eosinophil #: 0 10*3/uL (ref 0.0–0.7)
HCT: 40.2 % (ref 35.0–47.0)
HGB: 13.3 g/dL (ref 12.0–16.0)
Lymphocyte %: 3.9 %
Monocyte %: 6.3 %
WBC: 16.3 10*3/uL — ABNORMAL HIGH (ref 3.6–11.0)

## 2012-08-20 LAB — CBC WITH DIFFERENTIAL/PLATELET
Basophil #: 0 10*3/uL (ref 0.0–0.1)
Lymphocyte #: 0.7 10*3/uL — ABNORMAL LOW (ref 1.0–3.6)
MCH: 30.2 pg (ref 26.0–34.0)
MCV: 90 fL (ref 80–100)
Monocyte #: 0.7 x10 3/mm (ref 0.2–0.9)
Monocyte %: 6.6 %
Platelet: 257 10*3/uL (ref 150–440)
RBC: 3.71 10*6/uL — ABNORMAL LOW (ref 3.80–5.20)
RDW: 13.8 % (ref 11.5–14.5)
WBC: 10.2 10*3/uL (ref 3.6–11.0)

## 2012-08-21 LAB — CBC WITH DIFFERENTIAL/PLATELET
Basophil #: 0.1 10*3/uL (ref 0.0–0.1)
Eosinophil %: 0.2 %
MCV: 91 fL (ref 80–100)
Monocyte %: 7.6 %
Neutrophil %: 75.3 %
Platelet: 282 10*3/uL (ref 150–440)
RBC: 3.81 10*6/uL (ref 3.80–5.20)
WBC: 12.3 10*3/uL — ABNORMAL HIGH (ref 3.6–11.0)

## 2012-08-23 LAB — EXPECTORATED SPUTUM ASSESSMENT W GRAM STAIN, RFLX TO RESP C
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
## 2012-08-26 ENCOUNTER — Inpatient Hospital Stay: Payer: Self-pay | Admitting: Internal Medicine

## 2012-08-26 LAB — BASIC METABOLIC PANEL
Anion Gap: 8 (ref 7–16)
BUN: 39 mg/dL — ABNORMAL HIGH (ref 7–18)
Calcium, Total: 9.3 mg/dL (ref 8.5–10.1)
EGFR (African American): 51 — ABNORMAL LOW
EGFR (Non-African Amer.): 44 — ABNORMAL LOW
Glucose: 147 mg/dL — ABNORMAL HIGH (ref 65–99)

## 2012-08-26 LAB — CBC
HCT: 34.1 % — ABNORMAL LOW (ref 35.0–47.0)
MCV: 91 fL (ref 80–100)
RDW: 14 % (ref 11.5–14.5)
WBC: 25.3 10*3/uL — ABNORMAL HIGH (ref 3.6–11.0)

## 2012-08-26 LAB — DRUG SCREEN, URINE
Amphetamines, Ur Screen: NEGATIVE (ref ?–1000)
Barbiturates, Ur Screen: NEGATIVE (ref ?–200)
Benzodiazepine, Ur Scrn: POSITIVE (ref ?–200)
Cocaine Metabolite,Ur ~~LOC~~: NEGATIVE (ref ?–300)
MDMA (Ecstasy)Ur Screen: NEGATIVE (ref ?–500)
Methadone, Ur Screen: NEGATIVE (ref ?–300)
Opiate, Ur Screen: POSITIVE (ref ?–300)
Tricyclic, Ur Screen: POSITIVE (ref ?–1000)

## 2012-08-26 LAB — URINALYSIS, COMPLETE
Glucose,UR: NEGATIVE mg/dL (ref 0–75)
Hyaline Cast: 8
Ketone: NEGATIVE
Nitrite: NEGATIVE
Ph: 5 (ref 4.5–8.0)
Protein: 30
Specific Gravity: 1.018 (ref 1.003–1.030)
Squamous Epithelial: 6
WBC UR: 7 /HPF (ref 0–5)

## 2012-08-26 LAB — PRO B NATRIURETIC PEPTIDE: B-Type Natriuretic Peptide: 940 pg/mL — ABNORMAL HIGH (ref 0–125)

## 2012-08-27 LAB — COMPREHENSIVE METABOLIC PANEL
Chloride: 96 mmol/L — ABNORMAL LOW (ref 98–107)
Co2: 31 mmol/L (ref 21–32)
EGFR (African American): >60
EGFR (Non-African Amer.): 53 — ABNORMAL LOW
Osmolality: 275 (ref 275–301)
SGOT(AST): 524 U/L — ABNORMAL HIGH (ref 15–37)
SGPT (ALT): 490 U/L — ABNORMAL HIGH (ref 12–78)
Total Protein: 6.8 g/dL (ref 6.4–8.2)

## 2012-08-27 LAB — CBC WITH DIFFERENTIAL/PLATELET
Basophil %: 0.2 %
Eosinophil %: 0.4 %
HGB: 11 g/dL — ABNORMAL LOW (ref 12.0–16.0)
Lymphocyte %: 2.7 %
MCH: 30.2 pg (ref 26.0–34.0)
MCV: 91 fL (ref 80–100)
Monocyte #: 0.5 x10 3/mm (ref 0.2–0.9)
Neutrophil %: 94.2 %
RBC: 3.64 10*6/uL — ABNORMAL LOW (ref 3.80–5.20)

## 2012-08-28 DIAGNOSIS — I369 Nonrheumatic tricuspid valve disorder, unspecified: Secondary | ICD-10-CM

## 2012-08-28 LAB — COMPREHENSIVE METABOLIC PANEL
Anion Gap: 5 — ABNORMAL LOW (ref 7–16)
Calcium, Total: 9.1 mg/dL (ref 8.5–10.1)
Chloride: 101 mmol/L (ref 98–107)
Co2: 32 mmol/L (ref 21–32)
EGFR (African American): >60
EGFR (Non-African Amer.): 56 — ABNORMAL LOW
Glucose: 141 mg/dL — ABNORMAL HIGH (ref 65–99)
Potassium: 4.5 mmol/L (ref 3.5–5.1)
SGOT(AST): 165 U/L — ABNORMAL HIGH (ref 15–37)

## 2012-08-28 LAB — CBC WITH DIFFERENTIAL/PLATELET
Basophil #: 0 10*3/uL (ref 0.0–0.1)
Eosinophil #: 0 10*3/uL (ref 0.0–0.7)
Eosinophil %: 0 %
HCT: 30.6 % — ABNORMAL LOW (ref 35.0–47.0)
HGB: 10.1 g/dL — ABNORMAL LOW (ref 12.0–16.0)
Lymphocyte #: 0.7 10*3/uL — ABNORMAL LOW (ref 1.0–3.6)
Lymphocyte %: 5.3 %
MCHC: 33.1 g/dL (ref 32.0–36.0)
MCV: 91 fL (ref 80–100)
Monocyte #: 0.5 x10 3/mm (ref 0.2–0.9)
Neutrophil #: 12.4 10*3/uL — ABNORMAL HIGH (ref 1.4–6.5)
Platelet: 353 10*3/uL (ref 150–440)
RDW: 13.9 % (ref 11.5–14.5)

## 2012-08-28 LAB — MAGNESIUM: Magnesium: 1.9 mg/dL

## 2012-08-28 LAB — URINE CULTURE

## 2012-08-29 LAB — CBC WITH DIFFERENTIAL/PLATELET
Basophil #: 0.2 10*3/uL — ABNORMAL HIGH (ref 0.0–0.1)
Eosinophil #: 0 10*3/uL (ref 0.0–0.7)
HGB: 10 g/dL — ABNORMAL LOW (ref 12.0–16.0)
Lymphocyte %: 3.9 %
MCHC: 31.5 g/dL — ABNORMAL LOW (ref 32.0–36.0)
Monocyte #: 0.5 x10 3/mm (ref 0.2–0.9)
Neutrophil #: 18.5 10*3/uL — ABNORMAL HIGH (ref 1.4–6.5)
Neutrophil %: 92.5 %
Platelet: 457 10*3/uL — ABNORMAL HIGH (ref 150–440)
RDW: 14.3 % (ref 11.5–14.5)
WBC: 20 10*3/uL — ABNORMAL HIGH (ref 3.6–11.0)

## 2012-08-29 LAB — BASIC METABOLIC PANEL
Anion Gap: 6 — ABNORMAL LOW (ref 7–16)
BUN: 24 mg/dL — ABNORMAL HIGH (ref 7–18)
Calcium, Total: 9.1 mg/dL (ref 8.5–10.1)
Chloride: 105 mmol/L (ref 98–107)
Co2: 28 mmol/L (ref 21–32)
Creatinine: 1.07 mg/dL (ref 0.60–1.30)
EGFR (Non-African Amer.): 59 — ABNORMAL LOW
Glucose: 147 mg/dL — ABNORMAL HIGH (ref 65–99)
Osmolality: 284 (ref 275–301)
Potassium: 4.1 mmol/L (ref 3.5–5.1)

## 2012-08-29 LAB — HEPATIC FUNCTION PANEL A (ARMC)
Bilirubin, Direct: 0.1 mg/dL (ref 0.00–0.20)
Bilirubin,Total: 0.2 mg/dL (ref 0.2–1.0)
SGOT(AST): 71 U/L — ABNORMAL HIGH (ref 15–37)
Total Protein: 6 g/dL — ABNORMAL LOW (ref 6.4–8.2)

## 2012-08-30 LAB — CBC WITH DIFFERENTIAL/PLATELET
Basophil #: 0 10*3/uL (ref 0.0–0.1)
Eosinophil #: 0 10*3/uL (ref 0.0–0.7)
HCT: 30.6 % — ABNORMAL LOW (ref 35.0–47.0)
Lymphocyte %: 5.6 %
MCH: 32.4 pg (ref 26.0–34.0)
MCHC: 35.4 g/dL (ref 32.0–36.0)
Monocyte #: 0.7 x10 3/mm (ref 0.2–0.9)
Monocyte %: 4.8 %
Neutrophil #: 12.6 10*3/uL — ABNORMAL HIGH (ref 1.4–6.5)
Neutrophil %: 89.4 %
RBC: 3.34 10*6/uL — ABNORMAL LOW (ref 3.80–5.20)
RDW: 14.6 % — ABNORMAL HIGH (ref 11.5–14.5)

## 2012-08-30 LAB — BASIC METABOLIC PANEL
Anion Gap: 6 — ABNORMAL LOW (ref 7–16)
EGFR (African American): >60
EGFR (Non-African Amer.): >60
Osmolality: 291 (ref 275–301)

## 2012-08-31 LAB — CBC WITH DIFFERENTIAL/PLATELET
Basophil #: 0.1 10*3/uL (ref 0.0–0.1)
Eosinophil #: 0 10*3/uL (ref 0.0–0.7)
Eosinophil %: 0 %
Lymphocyte #: 0.7 10*3/uL — ABNORMAL LOW (ref 1.0–3.6)
MCH: 30 pg (ref 26.0–34.0)
MCHC: 32.8 g/dL (ref 32.0–36.0)
MCV: 92 fL (ref 80–100)
Monocyte #: 0.4 x10 3/mm (ref 0.2–0.9)
Monocyte %: 3 %
Neutrophil #: 13.2 10*3/uL — ABNORMAL HIGH (ref 1.4–6.5)
Neutrophil %: 91.9 %
RDW: 14.5 % (ref 11.5–14.5)
WBC: 14.4 10*3/uL — ABNORMAL HIGH (ref 3.6–11.0)

## 2012-09-01 LAB — CULTURE, BLOOD (SINGLE)

## 2012-09-13 ENCOUNTER — Other Ambulatory Visit: Payer: Self-pay | Admitting: Family Medicine

## 2012-09-16 ENCOUNTER — Emergency Department: Payer: Self-pay | Admitting: Emergency Medicine

## 2012-09-16 LAB — COMPREHENSIVE METABOLIC PANEL
Anion Gap: 7 (ref 7–16)
Bilirubin,Total: 0.1 mg/dL — ABNORMAL LOW (ref 0.2–1.0)
Calcium, Total: 9.3 mg/dL (ref 8.5–10.1)
Chloride: 108 mmol/L — ABNORMAL HIGH (ref 98–107)
Co2: 27 mmol/L (ref 21–32)
EGFR (African American): >60
EGFR (Non-African Amer.): >60
Glucose: 117 mg/dL — ABNORMAL HIGH (ref 65–99)
Osmolality: 285 (ref 275–301)
Potassium: 4.2 mmol/L (ref 3.5–5.1)
SGOT(AST): 21 U/L (ref 15–37)
Total Protein: 7 g/dL (ref 6.4–8.2)

## 2012-09-16 LAB — CBC
HCT: 34 % — ABNORMAL LOW (ref 35.0–47.0)
HGB: 11.2 g/dL — ABNORMAL LOW (ref 12.0–16.0)
MCHC: 33 g/dL (ref 32.0–36.0)
Platelet: 323 10*3/uL (ref 150–440)
RDW: 15 % — ABNORMAL HIGH (ref 11.5–14.5)
WBC: 7.6 10*3/uL (ref 3.6–11.0)

## 2012-09-16 LAB — TROPONIN I: Troponin-I: <0.02 ng/mL

## 2012-09-17 ENCOUNTER — Emergency Department: Payer: Self-pay | Admitting: Emergency Medicine

## 2012-09-17 LAB — COMPREHENSIVE METABOLIC PANEL
Albumin: 3.5 g/dL (ref 3.4–5.0)
Alkaline Phosphatase: 95 U/L (ref 50–136)
Anion Gap: 7 (ref 7–16)
Bilirubin,Total: 0.2 mg/dL (ref 0.2–1.0)
Calcium, Total: 9.3 mg/dL (ref 8.5–10.1)
Chloride: 106 mmol/L (ref 98–107)
Co2: 27 mmol/L (ref 21–32)
EGFR (African American): >60
Glucose: 123 mg/dL — ABNORMAL HIGH (ref 65–99)
Osmolality: 281 (ref 275–301)
SGOT(AST): 17 U/L (ref 15–37)
SGPT (ALT): 23 U/L (ref 12–78)
Sodium: 140 mmol/L (ref 136–145)

## 2012-09-17 LAB — CBC
MCH: 29.7 pg (ref 26.0–34.0)
Platelet: 327 10*3/uL (ref 150–440)
RBC: 3.67 10*6/uL — ABNORMAL LOW (ref 3.80–5.20)
RDW: 15.2 % — ABNORMAL HIGH (ref 11.5–14.5)

## 2012-09-17 LAB — TROPONIN I: Troponin-I: <0.02 ng/mL

## 2012-09-17 LAB — CK TOTAL AND CKMB (NOT AT ARMC): CK, Total: 112 U/L (ref 21–215)

## 2012-09-17 LAB — PRO B NATRIURETIC PEPTIDE: B-Type Natriuretic Peptide: 73 pg/mL (ref 0–125)

## 2012-09-21 ENCOUNTER — Emergency Department: Payer: Self-pay | Admitting: Emergency Medicine

## 2012-09-22 ENCOUNTER — Emergency Department: Payer: Self-pay | Admitting: Emergency Medicine

## 2012-09-22 LAB — CBC WITH DIFFERENTIAL/PLATELET
Basophil #: 0.1 10*3/uL (ref 0.0–0.1)
Basophil %: 0.5 %
HCT: 33.4 % — ABNORMAL LOW (ref 35.0–47.0)
HGB: 11 g/dL — ABNORMAL LOW (ref 12.0–16.0)
Lymphocyte #: 2.6 10*3/uL (ref 1.0–3.6)
MCH: 30.2 pg (ref 26.0–34.0)
Monocyte %: 6.7 %
Neutrophil #: 10.3 10*3/uL — ABNORMAL HIGH (ref 1.4–6.5)
Platelet: 304 10*3/uL (ref 150–440)
RBC: 3.64 10*6/uL — ABNORMAL LOW (ref 3.80–5.20)
RDW: 15 % — ABNORMAL HIGH (ref 11.5–14.5)
WBC: 14.3 10*3/uL — ABNORMAL HIGH (ref 3.6–11.0)

## 2012-09-22 LAB — COMPREHENSIVE METABOLIC PANEL
Albumin: 3.3 g/dL — ABNORMAL LOW (ref 3.4–5.0)
BUN: 12 mg/dL (ref 7–18)
Chloride: 107 mmol/L (ref 98–107)
Creatinine: 0.87 mg/dL (ref 0.60–1.30)
EGFR (Non-African Amer.): >60
Glucose: 93 mg/dL (ref 65–99)
Osmolality: 277 (ref 275–301)
Sodium: 139 mmol/L (ref 136–145)

## 2012-09-25 IMAGING — CR DG LUMBAR SPINE COMPLETE 4+V
5 series · 5 of 5 positions shown · non-contrast
Comparison: None.

CLINICAL DATA: Low back pain

LUMBAR SPINE - COMPLETE 4+ VIEW

[t l-spine a.p.]
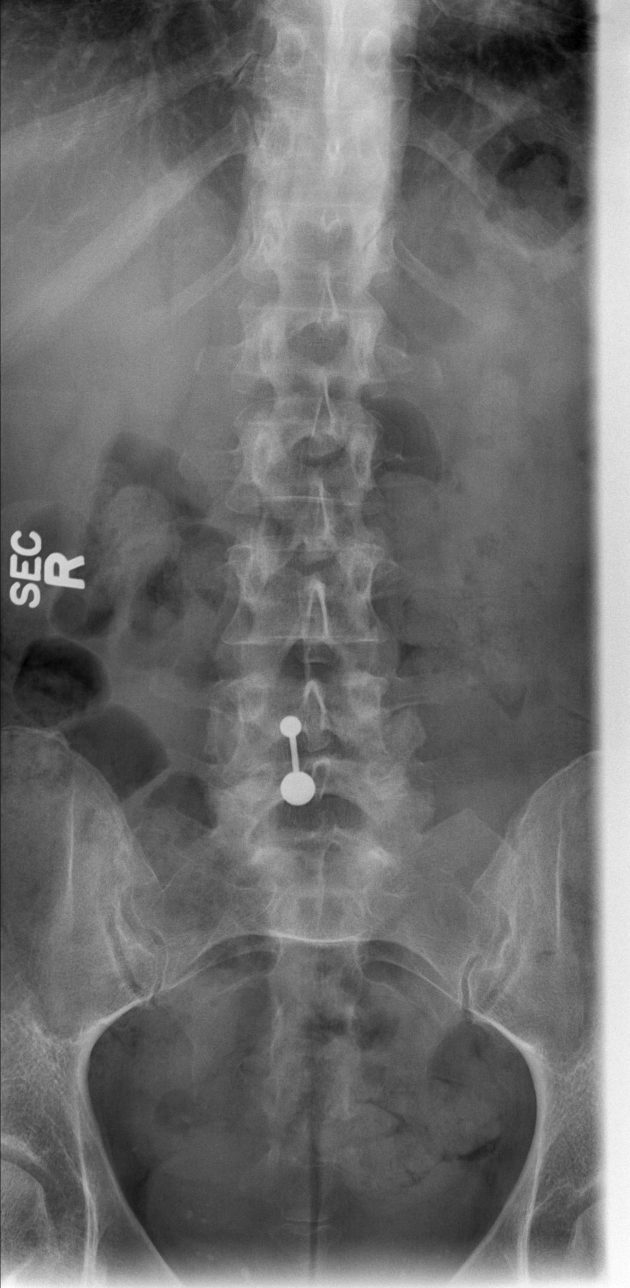

[t l-spine oblique exposure (1 of 2)]
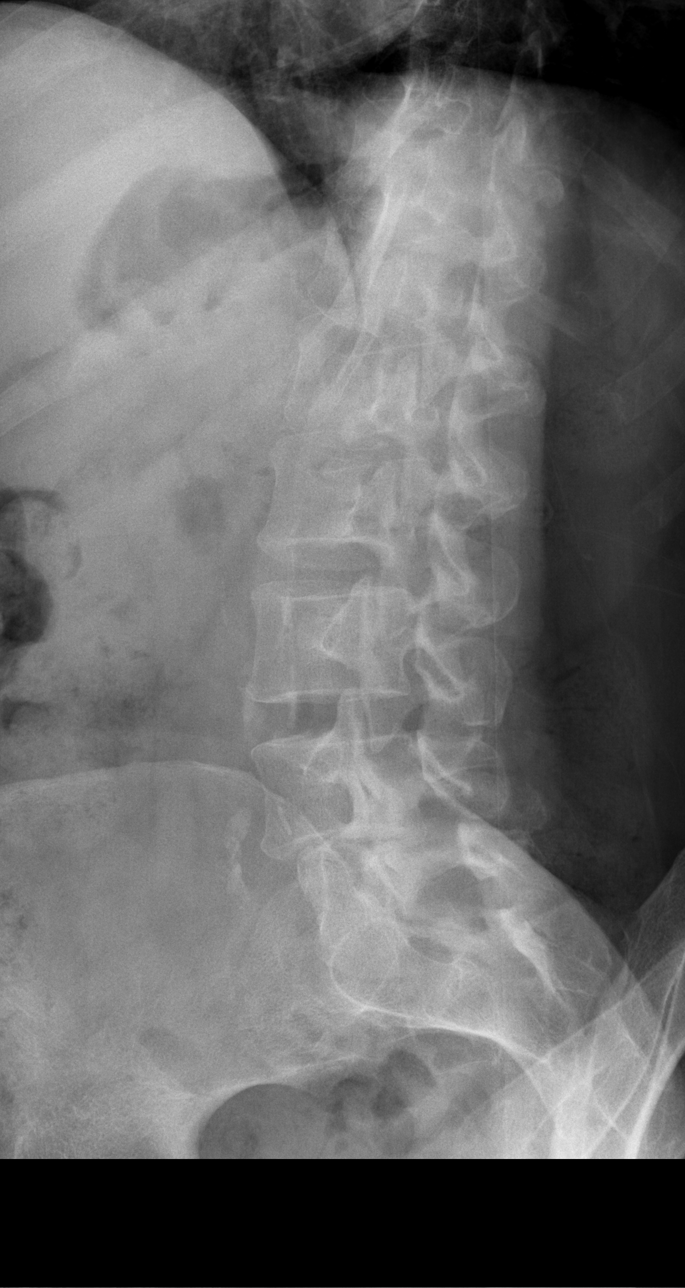

[t l-spine oblique exposure (2 of 2)]
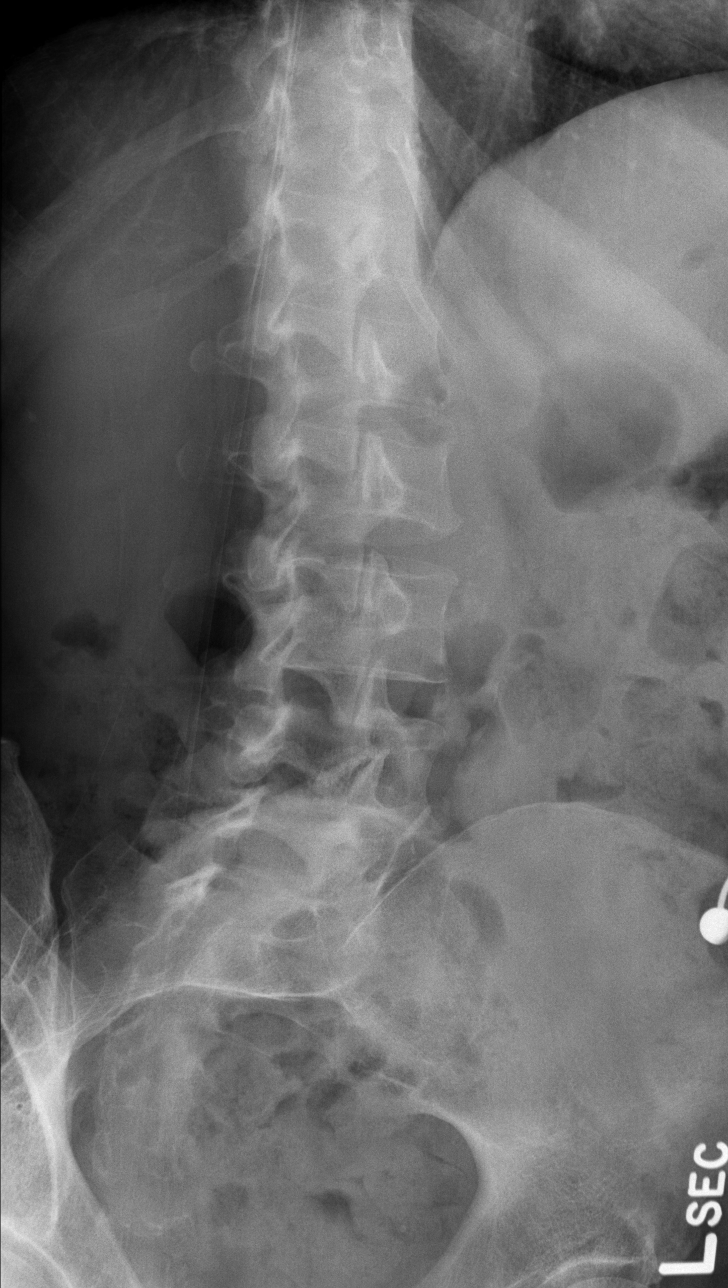

[t l-spine lat]
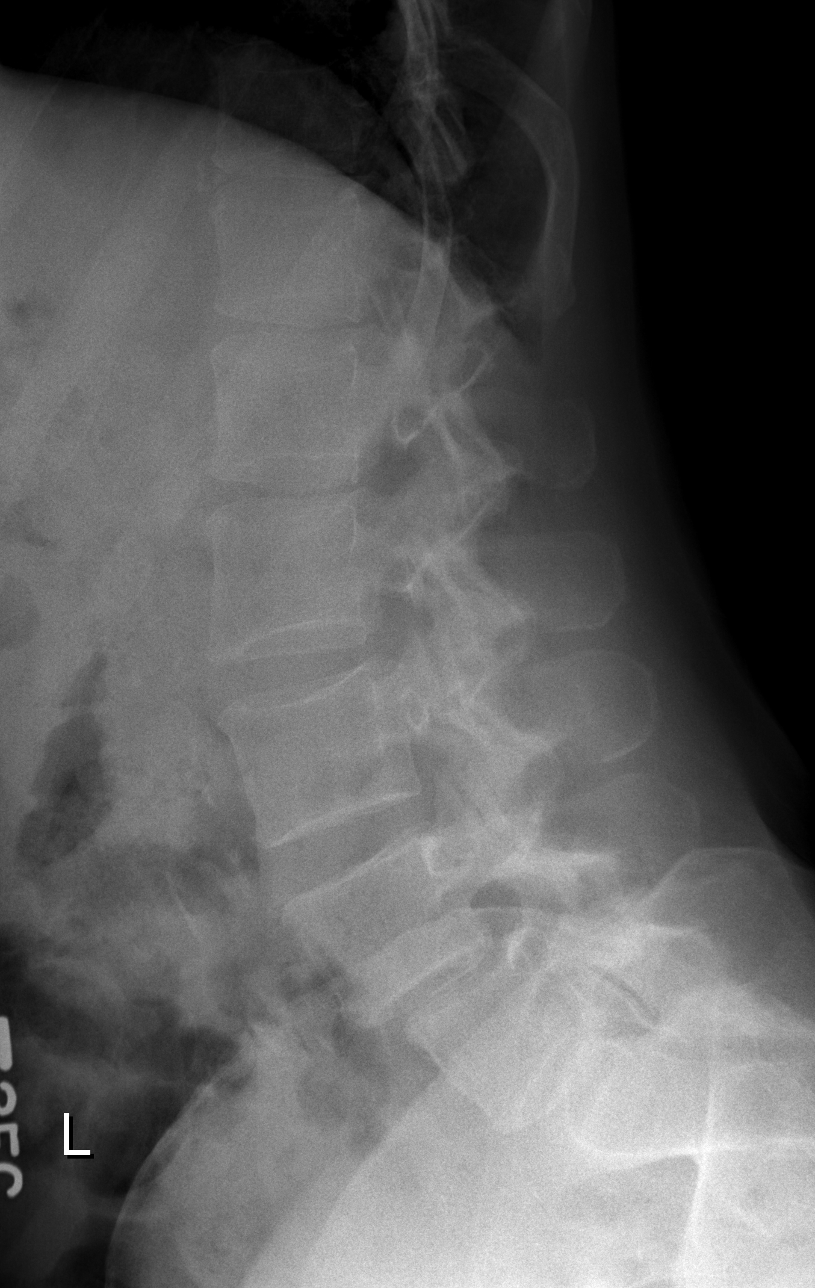

[t l-spine l5-s1 spot]
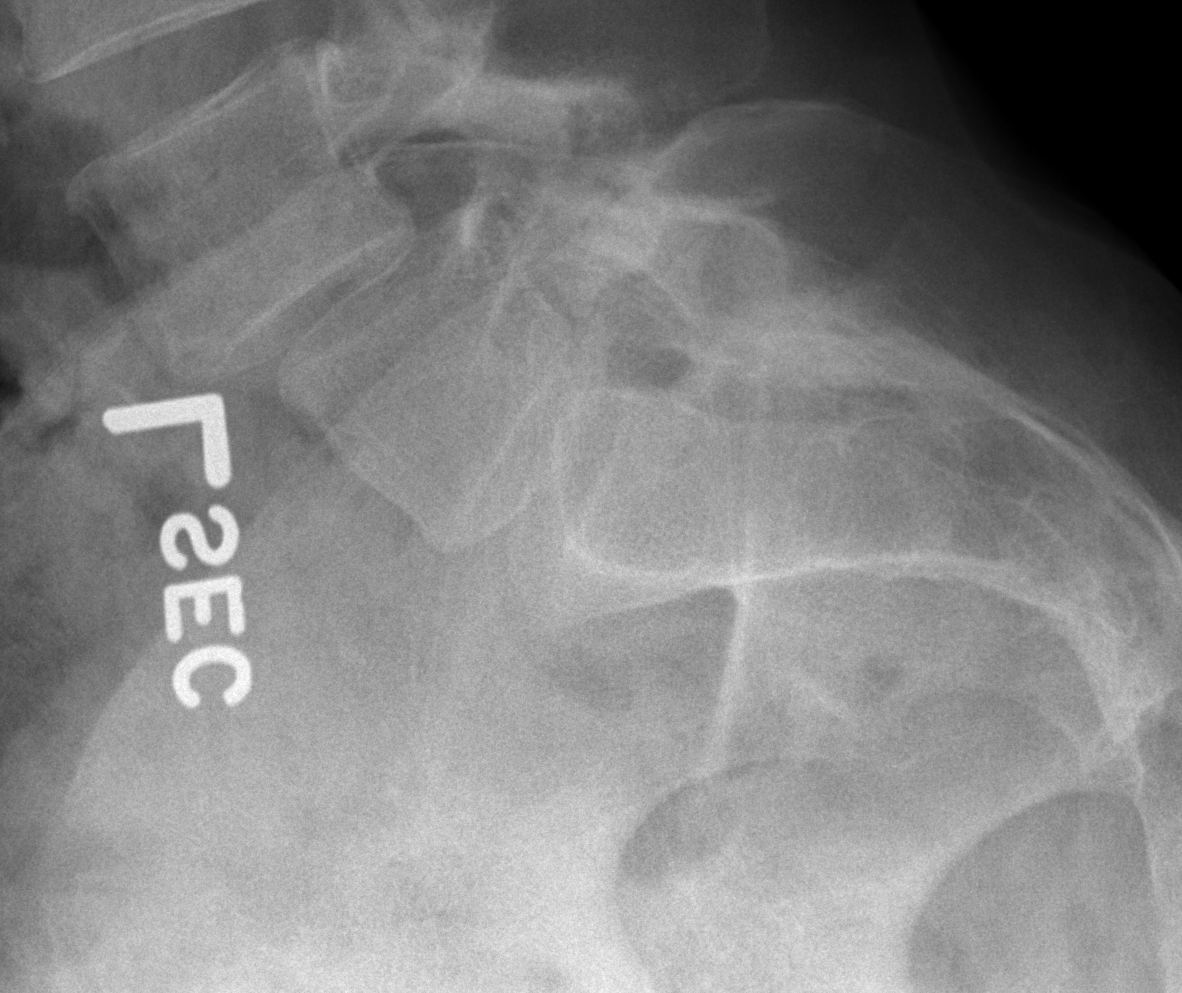

[5 of 5 positions shown; findings below may reference images not displayed]

FINDINGS: Bilateral facet degenerative changes L4-5 and L5-S1.
There is grade 2 anterolisthesis L4-5.  No definite pars defect.
Negative for fracture or other acute bony abnormality.  There is
narrowing of the L4-5 interspace.
IMPRESSION: 1.  Grade II anterolisthesis L4-5, presumably related to advanced
facet degenerative change.
2.  Negative for fracture or other acute bony abnormality.
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
## 2012-10-11 ENCOUNTER — Emergency Department: Payer: Self-pay | Admitting: Unknown Physician Specialty

## 2012-10-11 LAB — HEPATIC FUNCTION PANEL A (ARMC)
Albumin: 3.5 g/dL (ref 3.4–5.0)
Alkaline Phosphatase: 92 U/L (ref 50–136)
Bilirubin,Total: 0.2 mg/dL (ref 0.2–1.0)
SGOT(AST): 16 U/L (ref 15–37)
SGPT (ALT): 18 U/L (ref 12–78)

## 2012-10-11 LAB — DRUG SCREEN, URINE
Amphetamines, Ur Screen: NEGATIVE (ref ?–1000)
Barbiturates, Ur Screen: NEGATIVE (ref ?–200)
Benzodiazepine, Ur Scrn: POSITIVE (ref ?–200)
Cannabinoid 50 Ng, Ur ~~LOC~~: POSITIVE (ref ?–50)
Cocaine Metabolite,Ur ~~LOC~~: NEGATIVE (ref ?–300)
Methadone, Ur Screen: NEGATIVE (ref ?–300)
Opiate, Ur Screen: NEGATIVE (ref ?–300)

## 2012-10-11 LAB — URINALYSIS, COMPLETE
Bilirubin,UR: NEGATIVE
Blood: NEGATIVE
Glucose,UR: NEGATIVE mg/dL (ref 0–75)
Ketone: NEGATIVE
Nitrite: NEGATIVE
Ph: 7 (ref 4.5–8.0)
Protein: NEGATIVE
Specific Gravity: 1.013 (ref 1.003–1.030)
Squamous Epithelial: 3
WBC UR: 3 /HPF (ref 0–5)

## 2012-10-11 LAB — CBC
HCT: 38.6 % (ref 35.0–47.0)
HGB: 12.9 g/dL (ref 12.0–16.0)
MCV: 91 fL (ref 80–100)
Platelet: 400 10*3/uL (ref 150–440)

## 2012-10-11 LAB — BASIC METABOLIC PANEL
BUN: 10 mg/dL (ref 7–18)
Calcium, Total: 9.6 mg/dL (ref 8.5–10.1)
Chloride: 111 mmol/L — ABNORMAL HIGH (ref 98–107)
Co2: 26 mmol/L (ref 21–32)
Creatinine: 1.12 mg/dL (ref 0.60–1.30)
EGFR (African American): >60
EGFR (Non-African Amer.): 56 — ABNORMAL LOW
Glucose: 103 mg/dL — ABNORMAL HIGH (ref 65–99)
Sodium: 142 mmol/L (ref 136–145)

## 2012-10-11 LAB — TROPONIN I: Troponin-I: <0.02 ng/mL

## 2012-10-11 LAB — LIPASE, BLOOD: Lipase: 81 U/L (ref 73–393)

## 2012-10-20 ENCOUNTER — Ambulatory Visit: Payer: Self-pay | Admitting: Family Medicine

## 2012-10-28 ENCOUNTER — Emergency Department: Payer: Self-pay | Admitting: Emergency Medicine

## 2012-11-10 IMAGING — CR DG LUMBAR SPINE 2-3V
1 series · 4 of 4 positions shown · non-contrast
Comparison: none

REASON FOR EXAM: back pain
COMMENTS:

[Series 3: t lumbar spine ap · 0.14mm/px · 4 of 4 slices shown]
[im 1/4]
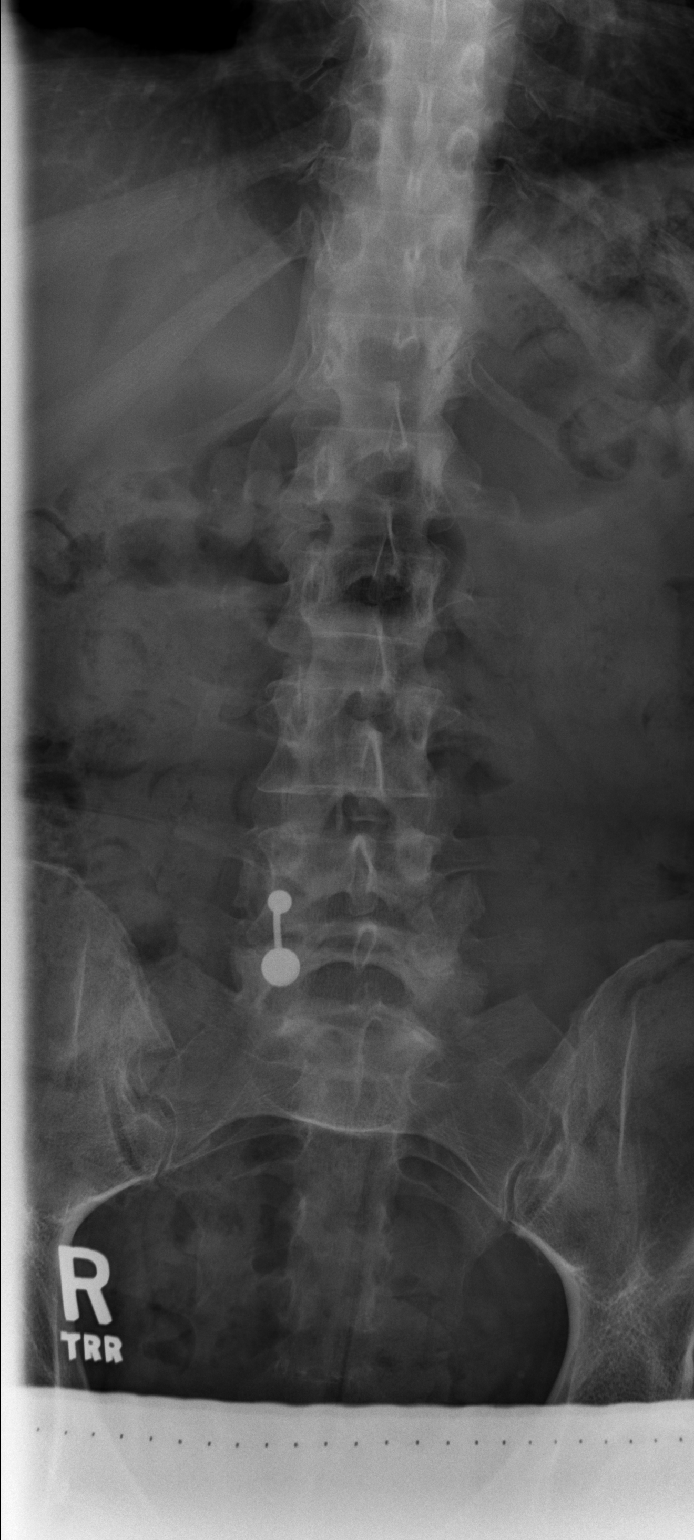
[im 2/4]
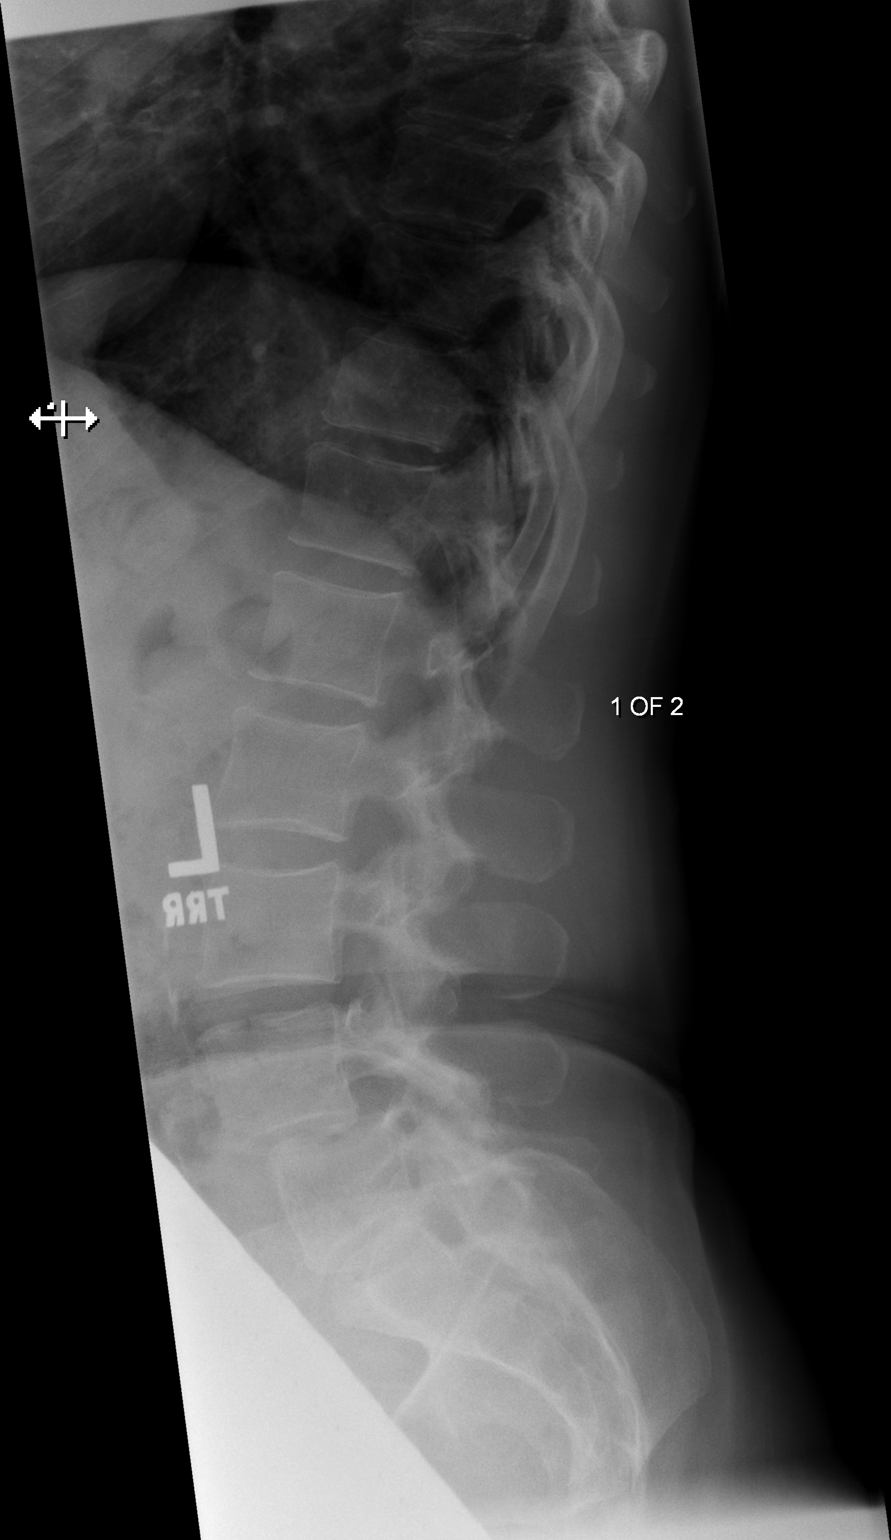
[im 3/4]
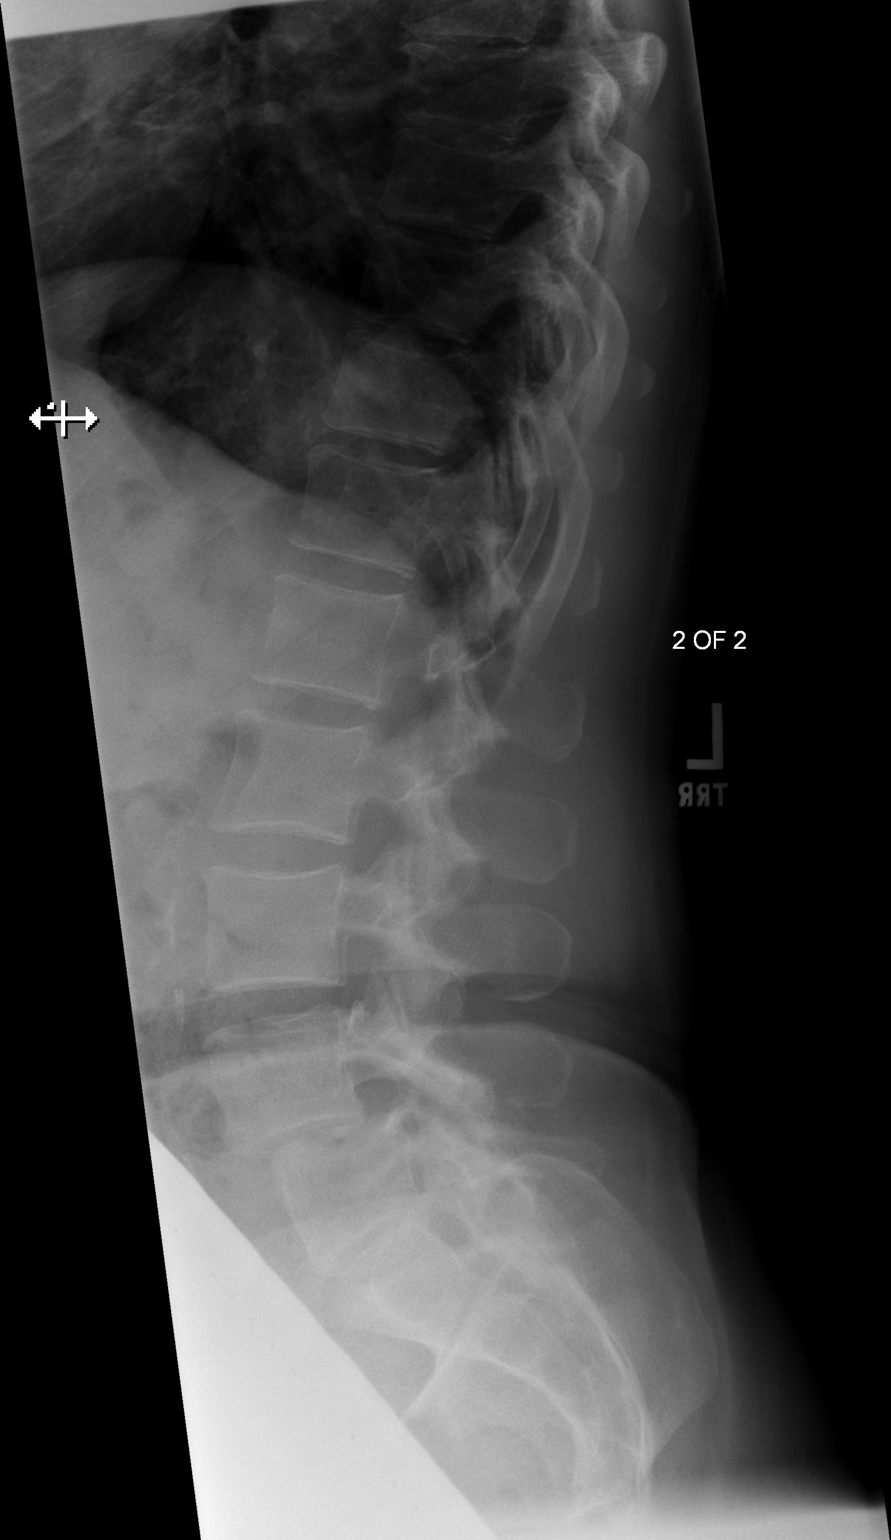
[im 4/4]
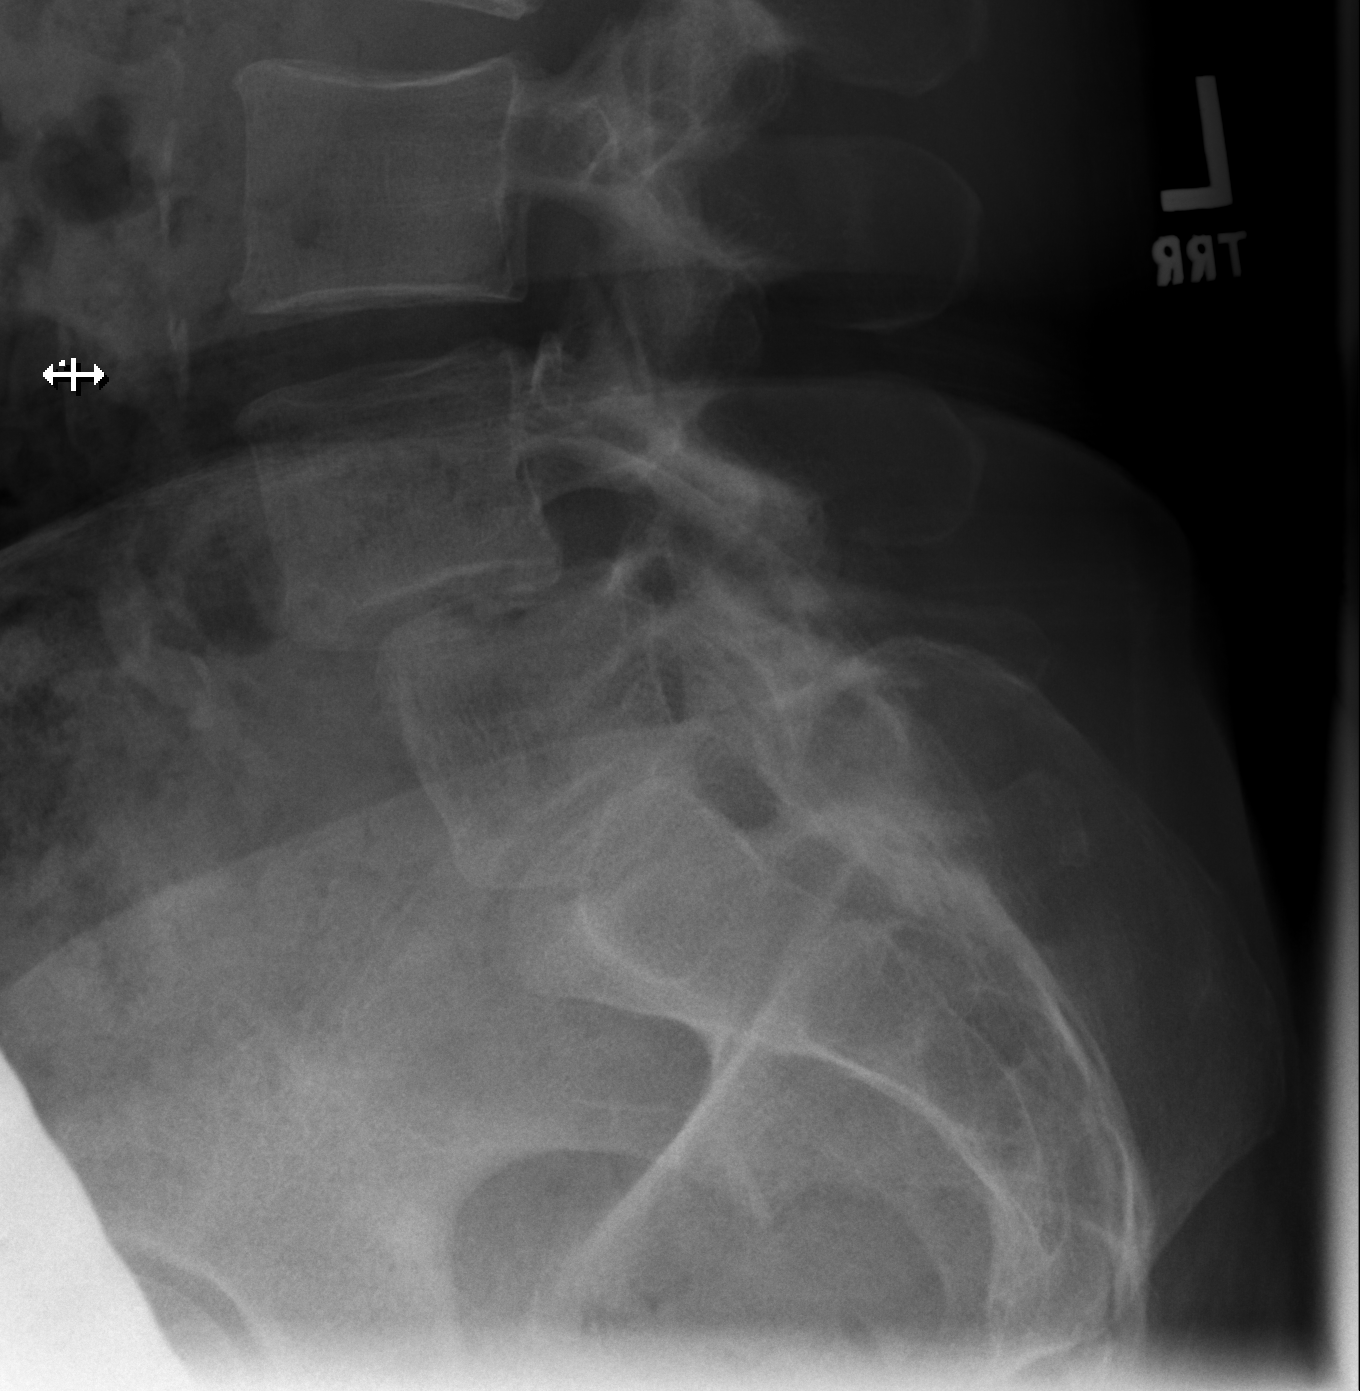

[4 of 4 positions shown; findings below may reference images not displayed]

PROCEDURE:     DXR - DXR LUMBAR SPINE AP AND LATERAL  - February 18, 2012  [DATE]

RESULT:     The vertebral body heights are well-maintained. No fracture is
seen. There is narrowing of the L4-L5 intervertebral disc space compatible
with disc disease. There is a 1.0 cm anterolisthesis of L4 on L5. This would
appear to be on a degenerative basis since no fracture is seen. The
vertebral body alignment is otherwise is normal. The pedicles are
bilaterally intact.
IMPRESSION: 1. No fracture is seen.
2. There is narrowing of the L4-L5 intervertebral disc space consistent with
disc disease.
3. There is spondylolisthesis without spondylolysis at the L4-L5 level.

[REDACTED]
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
## 2012-11-10 IMAGING — CR DG HUMERUS 2V *R*
1 series · 2 of 2 positions shown · non-contrast
Comparison: none

REASON FOR EXAM: fall, painful arm
COMMENTS:

PROCEDURE:     DXR - DXR HUMERUS RIGHT  - February 18, 2012  [DATE]
RESULT:     Two views were obtained. No fracture or dislocation is seen.

[Series 1: w humerus ap right · 0.14mm/px · 2 of 2 slices shown]
[im 1/2]
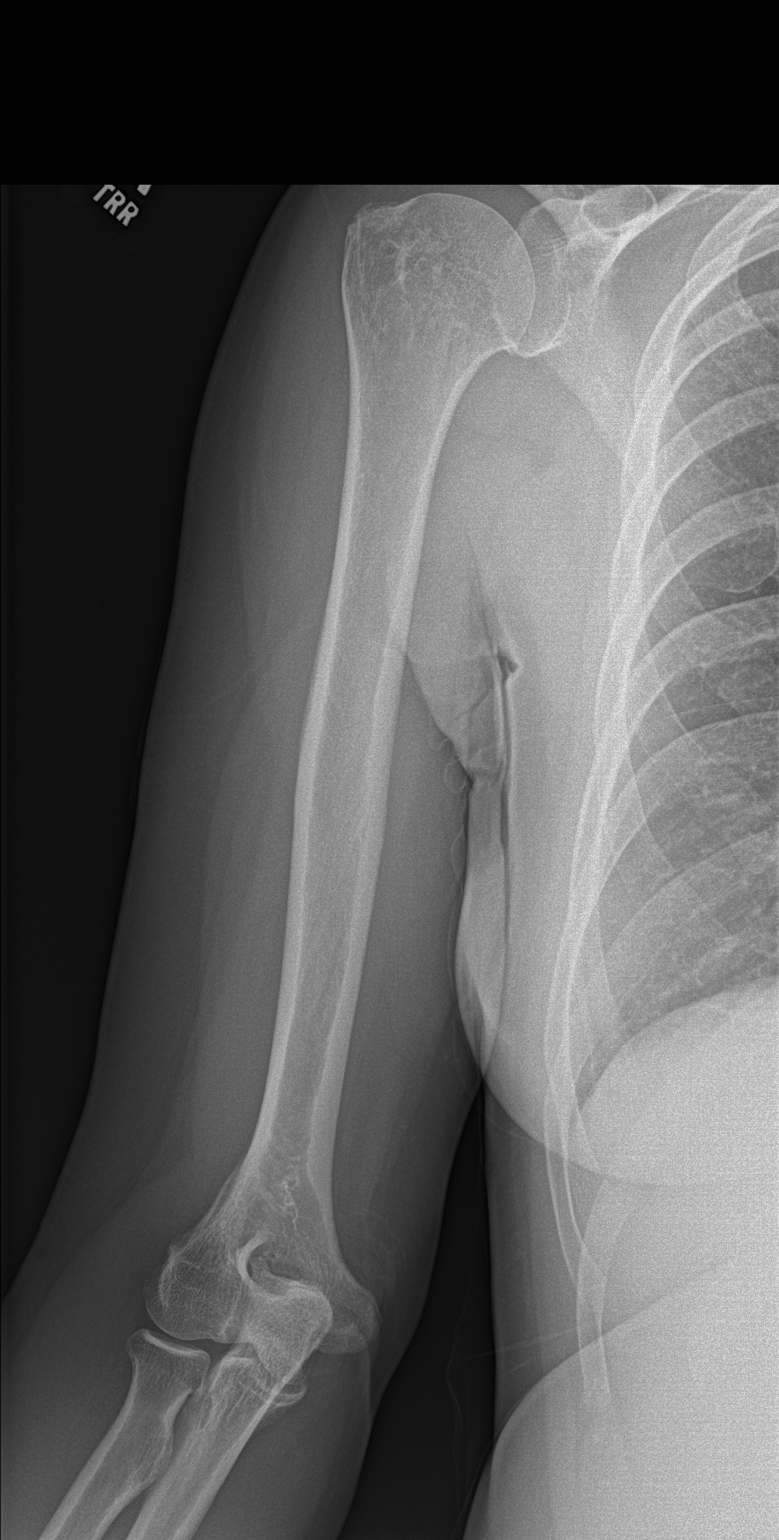
[im 2/2]
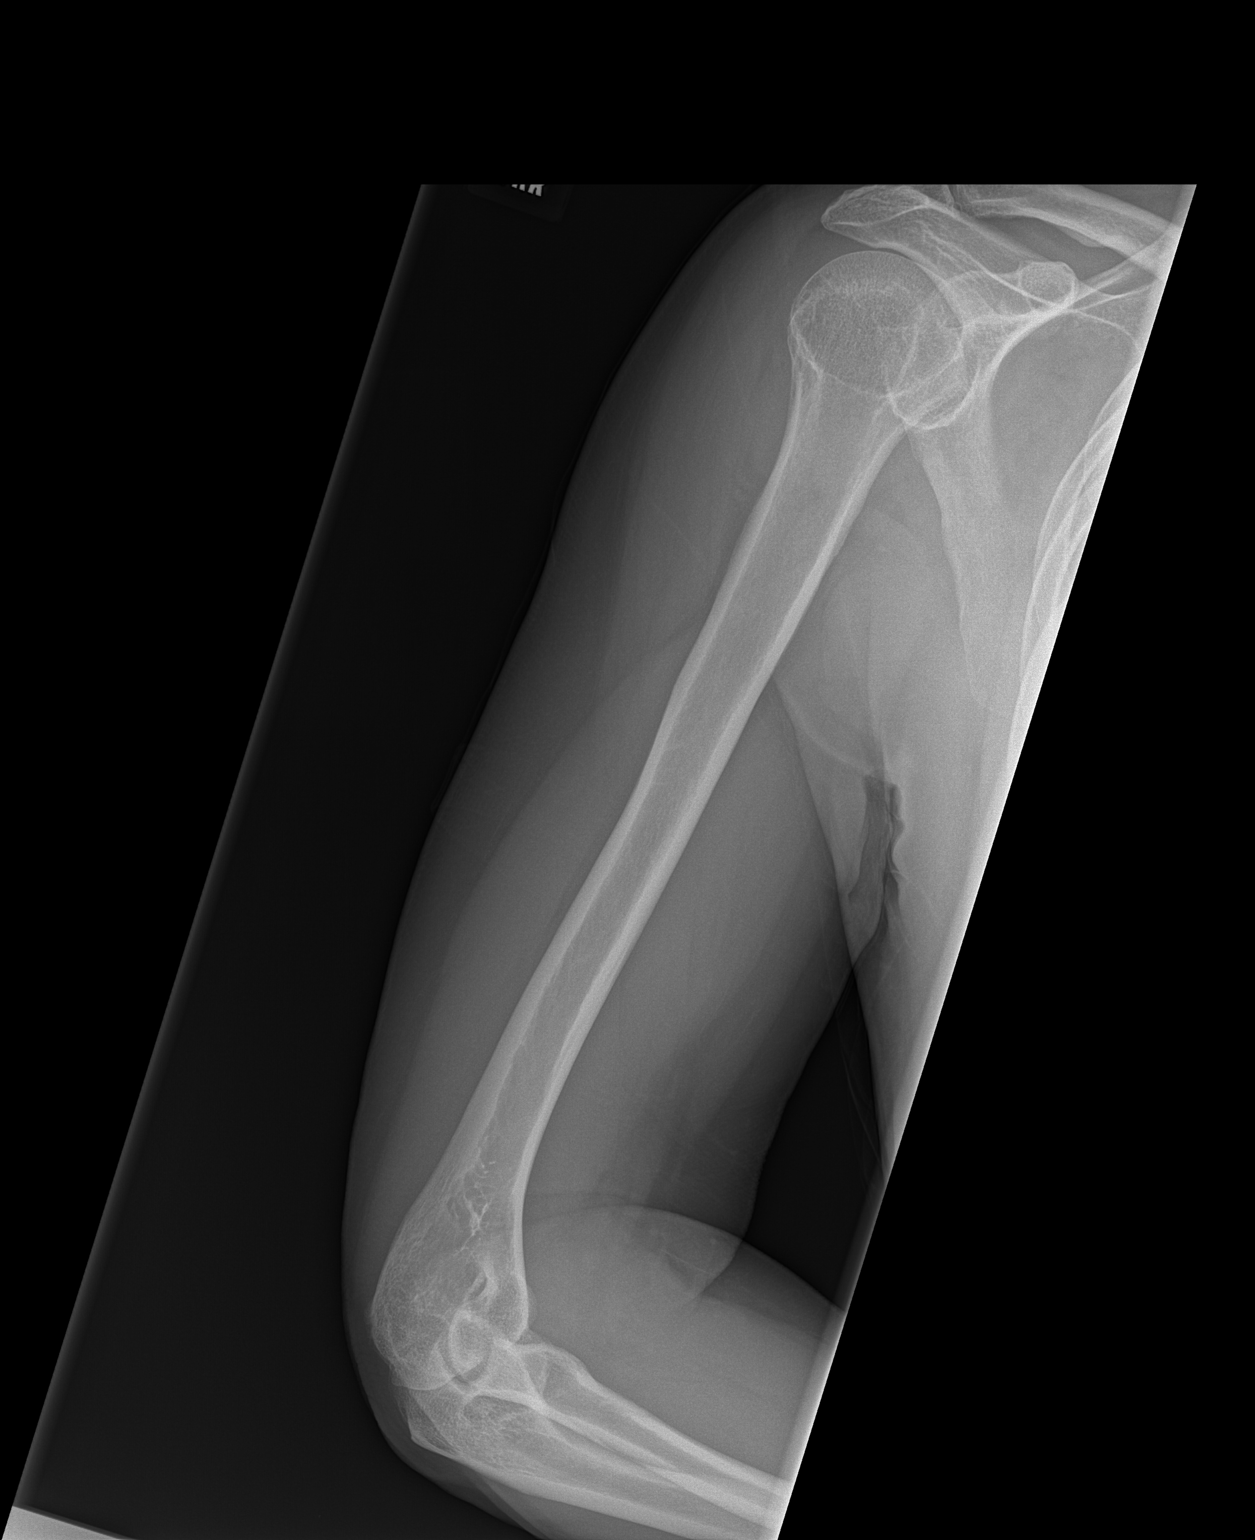

[2 of 2 positions shown; findings below may reference images not displayed]

IMPRESSION: No acute bony abnormalities are identified.

[REDACTED]

## 2012-11-10 IMAGING — CT CT HEAD WITHOUT CONTRAST
2 series · 16 of 30 positions shown, 20 images · non-contrast
Comparison: none

REASON FOR EXAM: loc headache after fall
COMMENTS:

PROCEDURE:     CT  - CT HEAD WITHOUT CONTRAST  - February 18, 2012  [DATE]
RESULT:     History: Headache. Fall.
Comparison Study: Prior head CT of 10/08/2011.

[Series 2: without · axial · non-contrast · 0.41mm/px · z∈[+359,+479]mm · 13 of 29 slices shown, 17 images]
[im 3/29  brain]
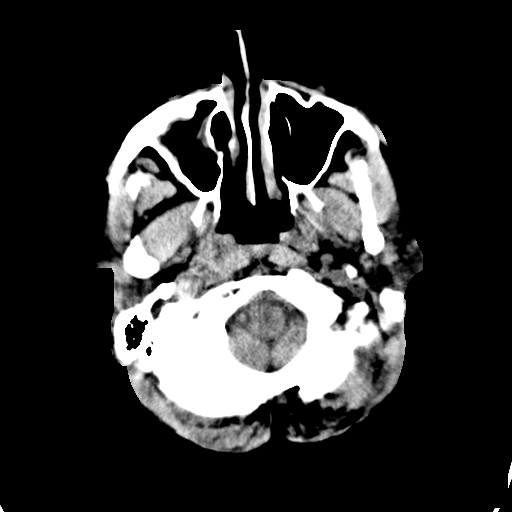
[im 3/29  bone]
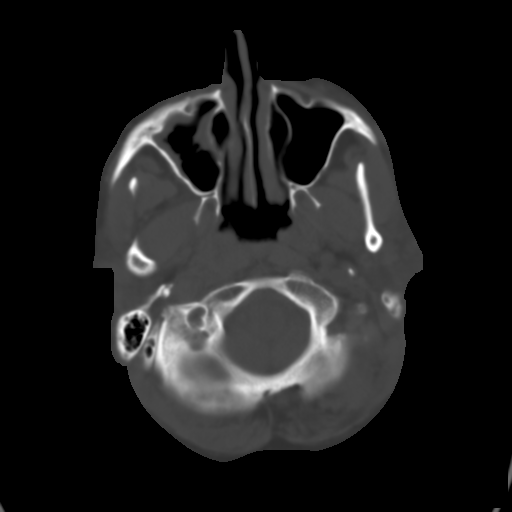
[im 5/29  brain]
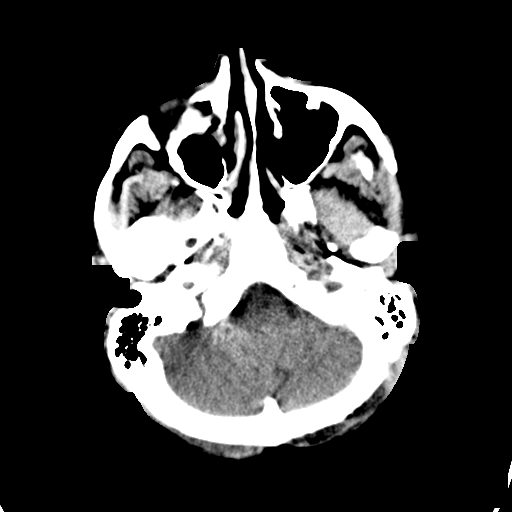
[im 7/29  brain]
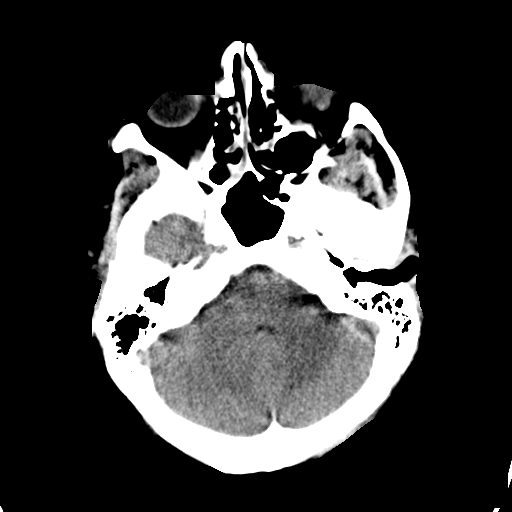
[im 9/29  brain]
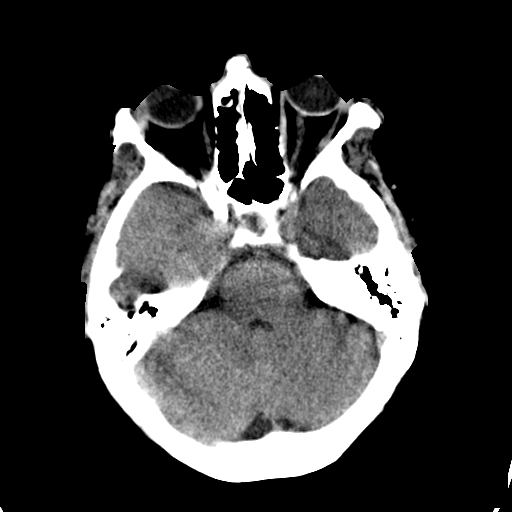
[im 11/29  brain]
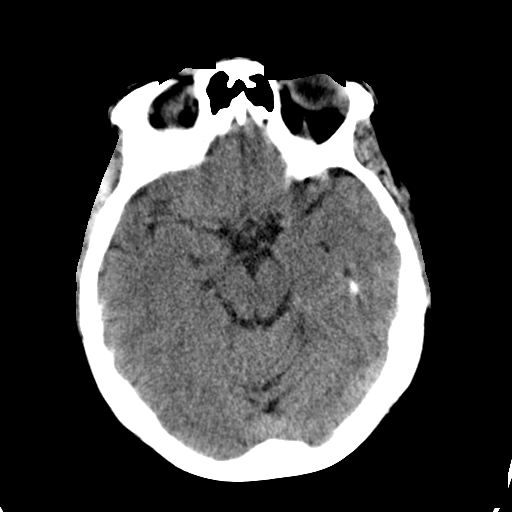
[im 11/29  bone]
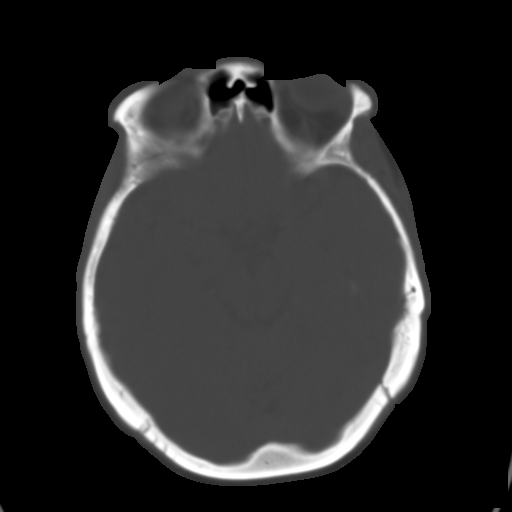
[im 13/29  brain]
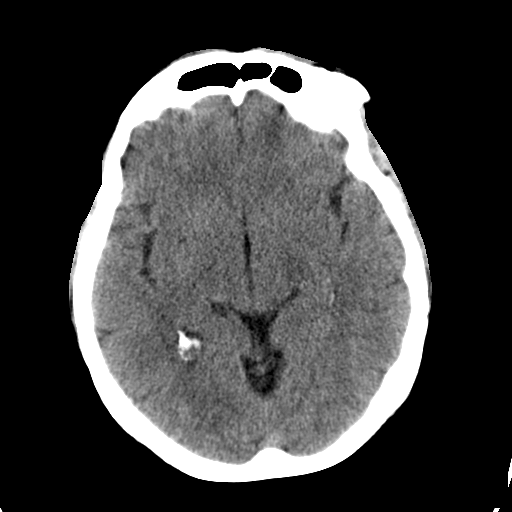
[im 15/29  brain]
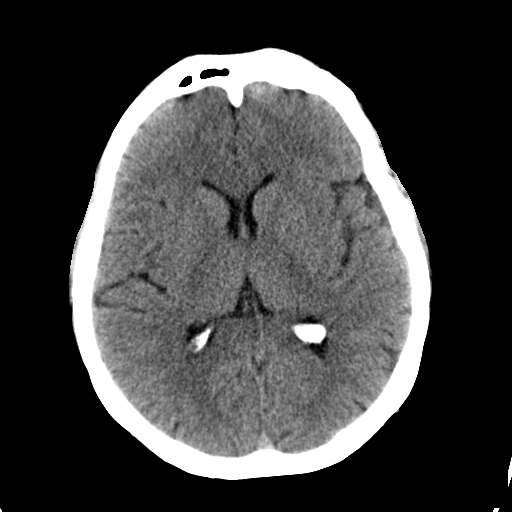
[im 17/29  brain]
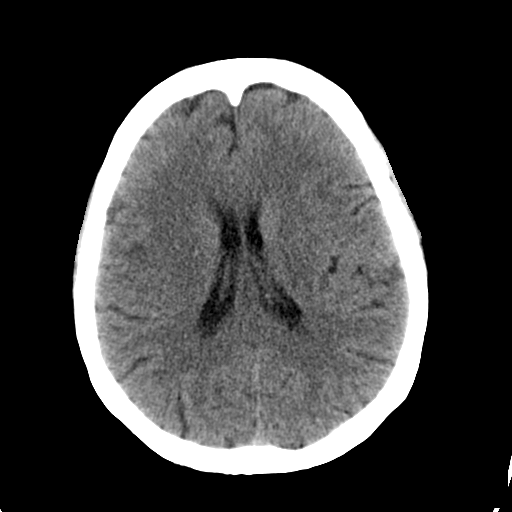
[im 19/29  brain]
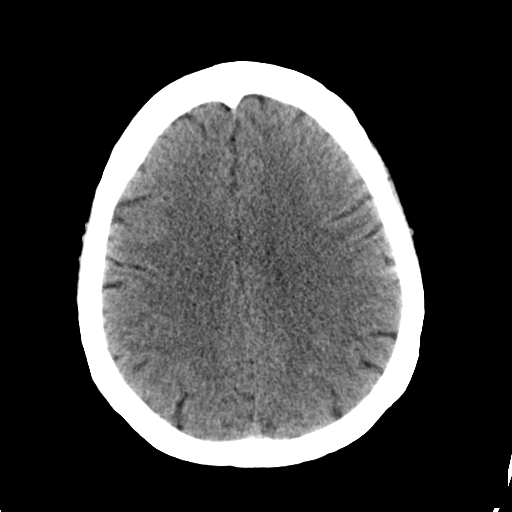
[im 19/29  bone]
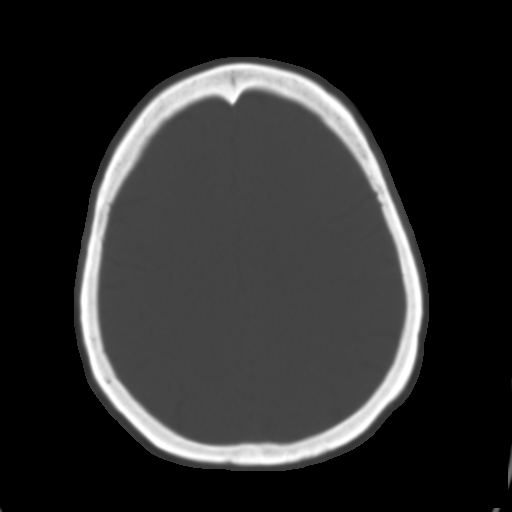
[im 21/29  brain]
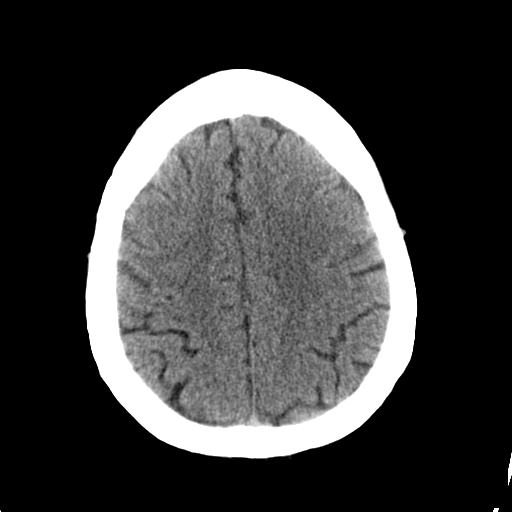
[im 23/29  brain]
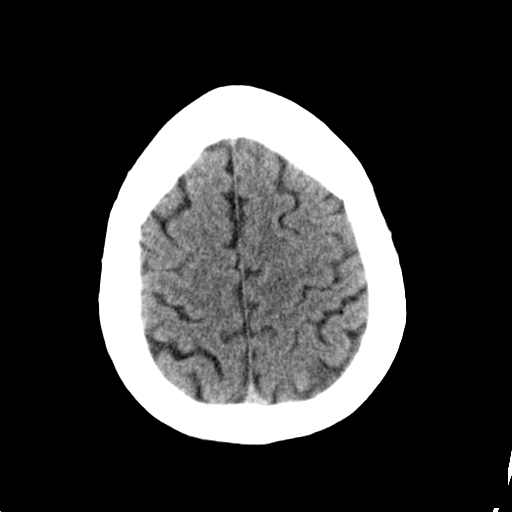
[im 25/29  brain]
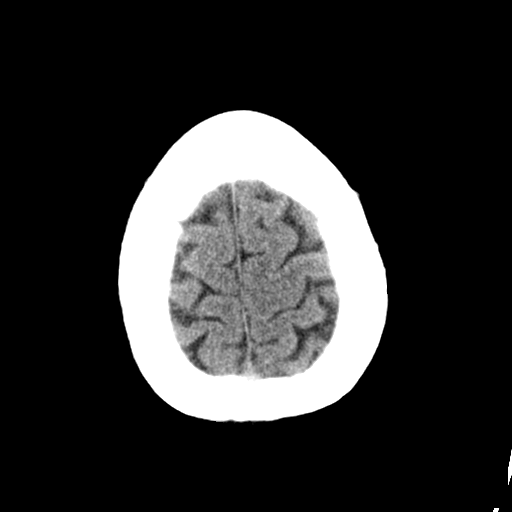
[im 27/29  brain]
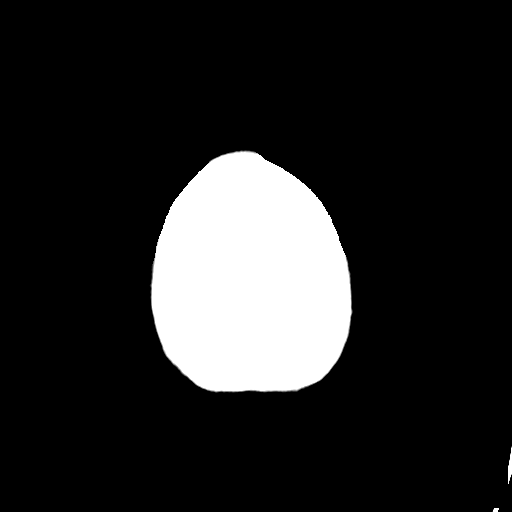
[im 27/29  bone]
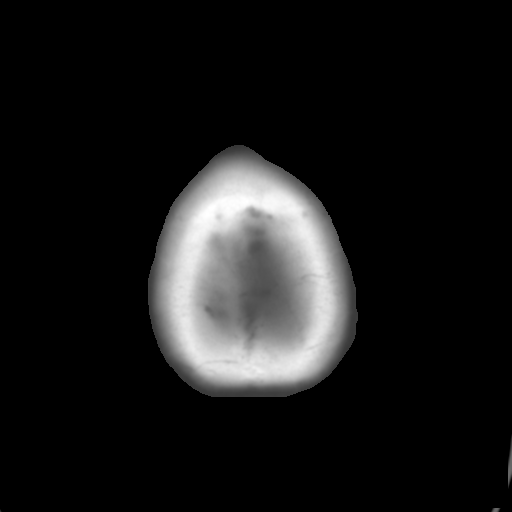

[Series 3: bone · axial · 0.42mm/px · z∈[+359,+399]mm · 3 of 29 slices shown]
[im 3/29  bone]
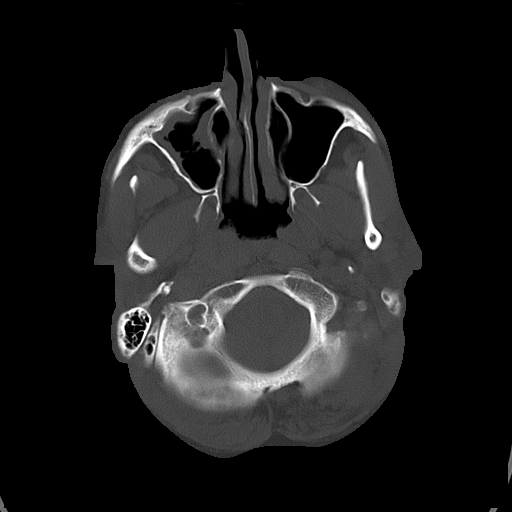
[im 7/29  bone]
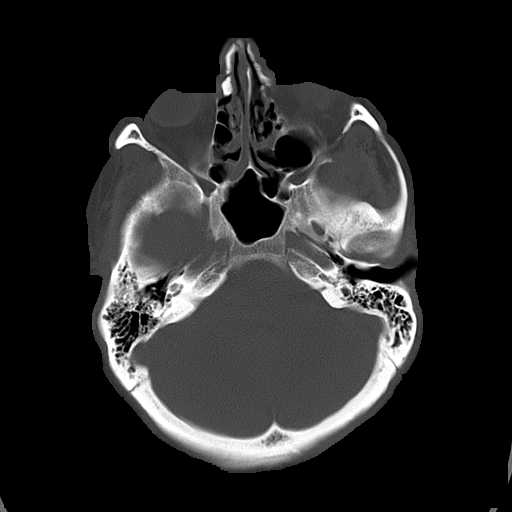
[im 11/29  bone]
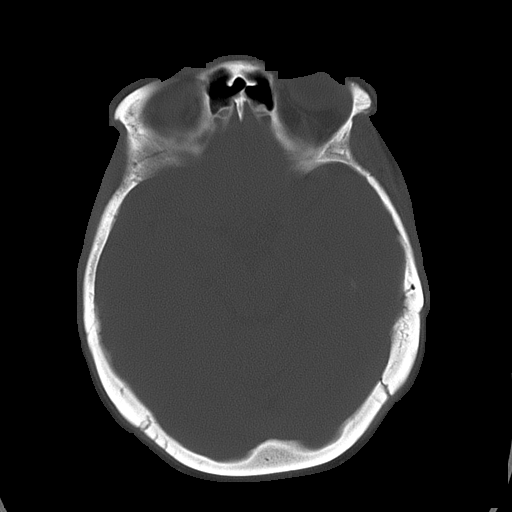

[16 of 30 positions shown; findings below may reference images not displayed]

FINDINGS: No mass. No hydrocephalus. No hemorrhage. No acute bony
abnormality. Old nasal fractures are noted. Mucosal thickening noted of the
ethmoidal and maxillary sinuses. No acute bony abnormality.
IMPRESSION: No acute abnormality.

## 2012-12-15 LAB — EXPECTORATED SPUTUM ASSESSMENT W GRAM STAIN, RFLX TO RESP C

## 2012-12-20 ENCOUNTER — Ambulatory Visit: Payer: Self-pay | Admitting: Internal Medicine

## 2012-12-21 ENCOUNTER — Emergency Department: Payer: Self-pay | Admitting: Emergency Medicine

## 2012-12-21 LAB — BASIC METABOLIC PANEL
Anion Gap: 5 — ABNORMAL LOW (ref 7–16)
BUN: 15 mg/dL (ref 7–18)
Chloride: 107 mmol/L (ref 98–107)
Co2: 27 mmol/L (ref 21–32)
EGFR (African American): >60
EGFR (Non-African Amer.): >60
Glucose: 128 mg/dL — ABNORMAL HIGH (ref 65–99)
Potassium: 4.6 mmol/L (ref 3.5–5.1)
Sodium: 139 mmol/L (ref 136–145)

## 2012-12-21 LAB — CBC
HCT: 47 % (ref 35.0–47.0)
HGB: 15.7 g/dL (ref 12.0–16.0)
MCV: 90 fL (ref 80–100)
Platelet: 399 10*3/uL (ref 150–440)
RBC: 5.22 10*6/uL — ABNORMAL HIGH (ref 3.80–5.20)
RDW: 15.2 % — ABNORMAL HIGH (ref 11.5–14.5)
WBC: 10.8 10*3/uL (ref 3.6–11.0)

## 2012-12-25 ENCOUNTER — Emergency Department: Payer: Self-pay | Admitting: Emergency Medicine

## 2012-12-30 ENCOUNTER — Ambulatory Visit: Payer: Self-pay | Admitting: Orthopedic Surgery

## 2013-01-01 ENCOUNTER — Emergency Department: Payer: Self-pay | Admitting: Emergency Medicine

## 2013-01-02 LAB — CBC WITH DIFFERENTIAL/PLATELET
Basophil #: 0.1 10*3/uL (ref 0.0–0.1)
Basophil %: 1.3 %
Eosinophil %: 5 %
HGB: 12.9 g/dL (ref 12.0–16.0)
Lymphocyte %: 23.2 %
MCH: 30.2 pg (ref 26.0–34.0)
MCHC: 33.9 g/dL (ref 32.0–36.0)
MCV: 89 fL (ref 80–100)
Monocyte #: 0.9 x10 3/mm (ref 0.2–0.9)
Monocyte %: 9.3 %
Neutrophil %: 61.2 %
Platelet: 284 10*3/uL (ref 150–440)
RBC: 4.28 10*6/uL (ref 3.80–5.20)
RDW: 15.2 % — ABNORMAL HIGH (ref 11.5–14.5)
WBC: 9.2 10*3/uL (ref 3.6–11.0)

## 2013-01-02 LAB — COMPREHENSIVE METABOLIC PANEL
Alkaline Phosphatase: 86 U/L (ref 50–136)
Bilirubin,Total: 0.4 mg/dL (ref 0.2–1.0)
Calcium, Total: 9.4 mg/dL (ref 8.5–10.1)
Co2: 28 mmol/L (ref 21–32)
EGFR (African American): >60
EGFR (Non-African Amer.): >60
Glucose: 116 mg/dL — ABNORMAL HIGH (ref 65–99)
Potassium: 4.2 mmol/L (ref 3.5–5.1)
SGOT(AST): 47 U/L — ABNORMAL HIGH (ref 15–37)

## 2013-01-02 LAB — DRUG SCREEN, URINE
Barbiturates, Ur Screen: NEGATIVE (ref ?–200)
Benzodiazepine, Ur Scrn: POSITIVE (ref ?–200)
Cocaine Metabolite,Ur ~~LOC~~: NEGATIVE (ref ?–300)
MDMA (Ecstasy)Ur Screen: NEGATIVE (ref ?–500)
Methadone, Ur Screen: NEGATIVE (ref ?–300)
Opiate, Ur Screen: NEGATIVE (ref ?–300)
Phencyclidine (PCP) Ur S: NEGATIVE (ref ?–25)
Tricyclic, Ur Screen: POSITIVE (ref ?–1000)

## 2013-01-02 LAB — ETHANOL: Ethanol: <3 mg/dL

## 2013-01-06 ENCOUNTER — Emergency Department: Payer: Self-pay | Admitting: Unknown Physician Specialty

## 2013-01-11 ENCOUNTER — Inpatient Hospital Stay: Payer: Self-pay | Admitting: Internal Medicine

## 2013-01-11 LAB — TSH: Thyroid Stimulating Horm: 2.52 u[IU]/mL

## 2013-01-11 LAB — COMPREHENSIVE METABOLIC PANEL
Albumin: 2.9 g/dL — ABNORMAL LOW (ref 3.4–5.0)
Alkaline Phosphatase: 138 U/L — ABNORMAL HIGH (ref 50–136)
Calcium, Total: 7.9 mg/dL — ABNORMAL LOW (ref 8.5–10.1)
Chloride: 93 mmol/L — ABNORMAL LOW (ref 98–107)
Co2: 15 mmol/L — ABNORMAL LOW (ref 21–32)
Creatinine: 11.17 mg/dL — ABNORMAL HIGH (ref 0.60–1.30)
Glucose: 87 mg/dL (ref 65–99)
Osmolality: 294 (ref 275–301)
Potassium: 6.2 mmol/L — ABNORMAL HIGH (ref 3.5–5.1)
SGOT(AST): 769 U/L — ABNORMAL HIGH (ref 15–37)
SGPT (ALT): 574 U/L — ABNORMAL HIGH (ref 12–78)
Sodium: 126 mmol/L — ABNORMAL LOW (ref 136–145)

## 2013-01-11 LAB — CBC
MCH: 28.8 pg (ref 26.0–34.0)
MCHC: 33.5 g/dL (ref 32.0–36.0)
MCV: 86 fL (ref 80–100)
Platelet: 313 10*3/uL (ref 150–440)
RBC: 3.99 10*6/uL (ref 3.80–5.20)
RDW: 14.9 % — ABNORMAL HIGH (ref 11.5–14.5)
WBC: 13.9 10*3/uL — ABNORMAL HIGH (ref 3.6–11.0)

## 2013-01-11 LAB — URINALYSIS, COMPLETE
Bilirubin,UR: NEGATIVE
Ketone: NEGATIVE
Nitrite: NEGATIVE
Ph: 7 (ref 4.5–8.0)
Protein: 100
RBC,UR: 1 /HPF (ref 0–5)
Specific Gravity: 1.017 (ref 1.003–1.030)

## 2013-01-11 LAB — SALICYLATE LEVEL: Salicylates, Serum: 3.9 mg/dL — ABNORMAL HIGH

## 2013-01-11 LAB — DRUG SCREEN, URINE
Amphetamines, Ur Screen: NEGATIVE (ref ?–1000)
Barbiturates, Ur Screen: NEGATIVE (ref ?–200)
Benzodiazepine, Ur Scrn: POSITIVE (ref ?–200)
Cannabinoid 50 Ng, Ur ~~LOC~~: NEGATIVE (ref ?–50)
Cocaine Metabolite,Ur ~~LOC~~: NEGATIVE (ref ?–300)
MDMA (Ecstasy)Ur Screen: NEGATIVE (ref ?–500)
Opiate, Ur Screen: POSITIVE (ref ?–300)
Tricyclic, Ur Screen: POSITIVE (ref ?–1000)

## 2013-01-11 LAB — TROPONIN I: Troponin-I: <0.02 ng/mL

## 2013-01-11 LAB — PHOSPHORUS: Phosphorus: 8.9 mg/dL — ABNORMAL HIGH (ref 2.5–4.9)

## 2013-01-11 LAB — CK TOTAL AND CKMB (NOT AT ARMC)
CK, Total: 41689 U/L — ABNORMAL HIGH (ref 21–215)
CK-MB: 318.7 ng/mL — ABNORMAL HIGH (ref 0.5–3.6)

## 2013-01-11 LAB — PROTIME-INR
INR: 1.1
Prothrombin Time: 14 secs (ref 11.5–14.7)

## 2013-01-11 LAB — ETHANOL: Ethanol: <3 mg/dL

## 2013-01-11 LAB — ACETAMINOPHEN LEVEL: Acetaminophen: <2 ug/mL

## 2013-01-12 LAB — CBC WITH DIFFERENTIAL/PLATELET
Basophil #: 0 10*3/uL (ref 0.0–0.1)
Eosinophil #: 0.1 10*3/uL (ref 0.0–0.7)
Eosinophil %: 0.9 %
HCT: 32.4 % — ABNORMAL LOW (ref 35.0–47.0)
MCH: 29.6 pg (ref 26.0–34.0)
MCV: 87 fL (ref 80–100)
Monocyte #: 0.8 x10 3/mm (ref 0.2–0.9)
Monocyte %: 7.5 %
Neutrophil #: 9.3 10*3/uL — ABNORMAL HIGH (ref 1.4–6.5)
Neutrophil %: 83.9 %
RDW: 14.9 % — ABNORMAL HIGH (ref 11.5–14.5)
WBC: 11.1 10*3/uL — ABNORMAL HIGH (ref 3.6–11.0)

## 2013-01-12 LAB — BASIC METABOLIC PANEL
Chloride: 98 mmol/L (ref 98–107)
EGFR (African American): 5 — ABNORMAL LOW
EGFR (Non-African Amer.): 4 — ABNORMAL LOW
Glucose: 114 mg/dL — ABNORMAL HIGH (ref 65–99)
Osmolality: 293 (ref 275–301)
Sodium: 131 mmol/L — ABNORMAL LOW (ref 136–145)

## 2013-01-12 LAB — MAGNESIUM: Magnesium: 2.2 mg/dL

## 2013-01-12 LAB — TSH: Thyroid Stimulating Horm: 2.23 u[IU]/mL

## 2013-01-12 LAB — CK: CK, Total: 23903 U/L — ABNORMAL HIGH (ref 21–215)

## 2013-01-13 LAB — RENAL FUNCTION PANEL
Albumin: 1.8 g/dL — ABNORMAL LOW
Anion Gap: 5 — ABNORMAL LOW
BUN: 42 mg/dL — ABNORMAL HIGH
Calcium, Total: 7.7 mg/dL — ABNORMAL LOW
Chloride: 105 mmol/L
Co2: 30 mmol/L
Creatinine: 4.22 mg/dL — ABNORMAL HIGH
EGFR (African American): 13 — ABNORMAL LOW
EGFR (Non-African Amer.): 11 — ABNORMAL LOW
Glucose: 102 mg/dL — ABNORMAL HIGH
Osmolality: 290
Phosphorus: 3.1 mg/dL
Potassium: 3.8 mmol/L
Sodium: 140 mmol/L

## 2013-01-14 LAB — RENAL FUNCTION PANEL
Anion Gap: 7 (ref 7–16)
BUN: 26 mg/dL — ABNORMAL HIGH (ref 7–18)
Calcium, Total: 8 mg/dL — ABNORMAL LOW (ref 8.5–10.1)
Chloride: 106 mmol/L (ref 98–107)
EGFR (African American): 16 — ABNORMAL LOW
Glucose: 91 mg/dL (ref 65–99)
Phosphorus: 3 mg/dL (ref 2.5–4.9)
Sodium: 141 mmol/L (ref 136–145)

## 2013-01-14 LAB — CK: CK, Total: 4017 U/L — ABNORMAL HIGH (ref 21–215)

## 2013-01-15 LAB — BASIC METABOLIC PANEL
BUN: 30 mg/dL — ABNORMAL HIGH (ref 7–18)
Calcium, Total: 8.5 mg/dL (ref 8.5–10.1)
Chloride: 106 mmol/L (ref 98–107)
Co2: 26 mmol/L (ref 21–32)
EGFR (African American): 14 — ABNORMAL LOW
EGFR (Non-African Amer.): 12 — ABNORMAL LOW
Glucose: 89 mg/dL (ref 65–99)
Potassium: 4.2 mmol/L (ref 3.5–5.1)

## 2013-01-16 LAB — BASIC METABOLIC PANEL
Anion Gap: 4 — ABNORMAL LOW (ref 7–16)
BUN: 32 mg/dL — ABNORMAL HIGH (ref 7–18)
Calcium, Total: 8.8 mg/dL (ref 8.5–10.1)
Chloride: 103 mmol/L (ref 98–107)
Co2: 30 mmol/L (ref 21–32)
Creatinine: 4.21 mg/dL — ABNORMAL HIGH (ref 0.60–1.30)
EGFR (Non-African Amer.): 11 — ABNORMAL LOW
Glucose: 91 mg/dL (ref 65–99)
Osmolality: 280 (ref 275–301)

## 2013-01-16 LAB — PLATELET COUNT: Platelet: 344 10*3/uL (ref 150–440)

## 2013-01-17 LAB — BASIC METABOLIC PANEL
Anion Gap: 4 — ABNORMAL LOW (ref 7–16)
BUN: 30 mg/dL — ABNORMAL HIGH (ref 7–18)
Calcium, Total: 8.9 mg/dL (ref 8.5–10.1)
Chloride: 103 mmol/L (ref 98–107)
Co2: 31 mmol/L (ref 21–32)
Osmolality: 282 (ref 275–301)
Potassium: 4.9 mmol/L (ref 3.5–5.1)
Sodium: 138 mmol/L (ref 136–145)

## 2013-01-21 ENCOUNTER — Emergency Department: Payer: Self-pay | Admitting: Emergency Medicine

## 2013-01-21 LAB — URINALYSIS, COMPLETE
Bilirubin,UR: NEGATIVE
Glucose,UR: NEGATIVE mg/dL (ref 0–75)
Nitrite: POSITIVE
Ph: 7 (ref 4.5–8.0)
RBC,UR: 7 /HPF (ref 0–5)
Specific Gravity: 1.012 (ref 1.003–1.030)
Squamous Epithelial: 2

## 2013-01-21 LAB — COMPREHENSIVE METABOLIC PANEL
Anion Gap: 7 (ref 7–16)
BUN: 33 mg/dL — ABNORMAL HIGH (ref 7–18)
Bilirubin,Total: 0.1 mg/dL — ABNORMAL LOW (ref 0.2–1.0)
Calcium, Total: 9.4 mg/dL (ref 8.5–10.1)
Chloride: 108 mmol/L — ABNORMAL HIGH (ref 98–107)
EGFR (African American): 22 — ABNORMAL LOW
EGFR (Non-African Amer.): 19 — ABNORMAL LOW
SGOT(AST): 42 U/L — ABNORMAL HIGH (ref 15–37)
Total Protein: 6.6 g/dL (ref 6.4–8.2)
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
## 2013-02-13 IMAGING — CT CT CERVICAL SPINE WITHOUT CONTRAST
1 series · 12 of 14 positions shown, 15 images · non-contrast
Comparison: none

REASON FOR EXAM: diffuse cspine tenderness after fall
COMMENTS:

[Series 4: axial · axial · 0.33mm/px · z∈[-342,-210]mm · 12 of 83 slices shown, 15 images]
[im 7/83  soft-tissue]
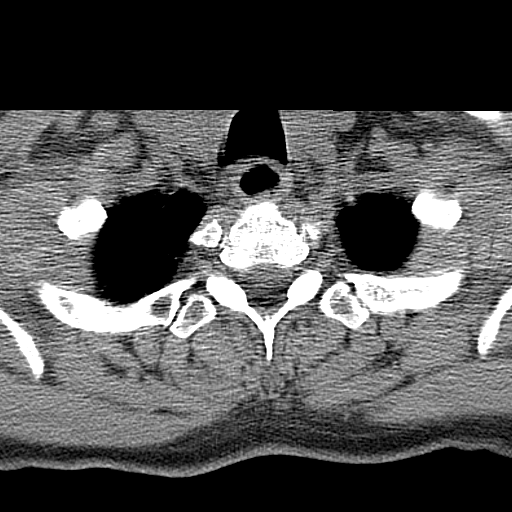
[im 7/83  bone]
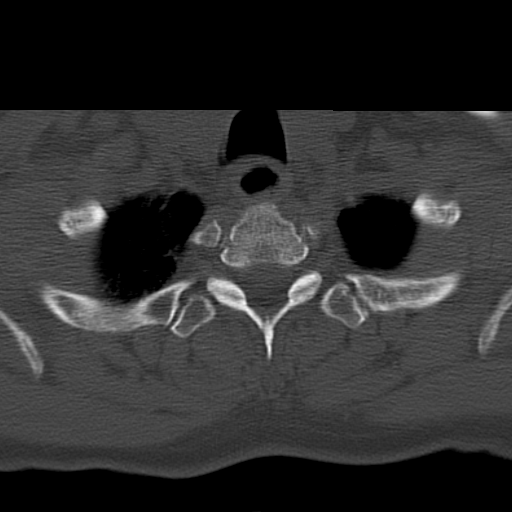
[im 13/83  bone]
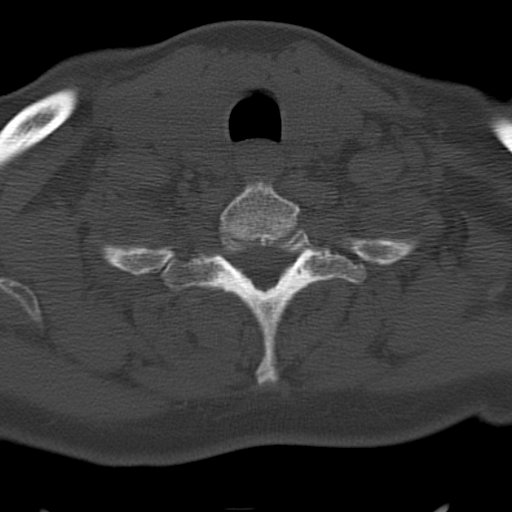
[im 19/83  bone]
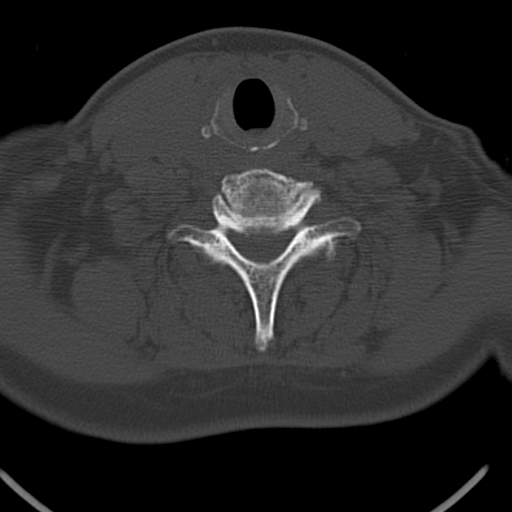
[im 26/83  bone]
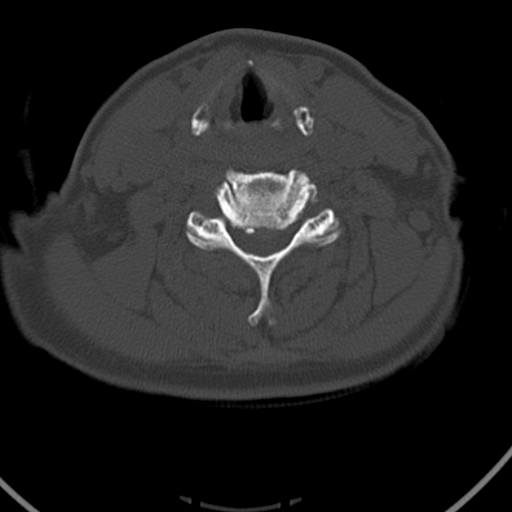
[im 32/83  soft-tissue]
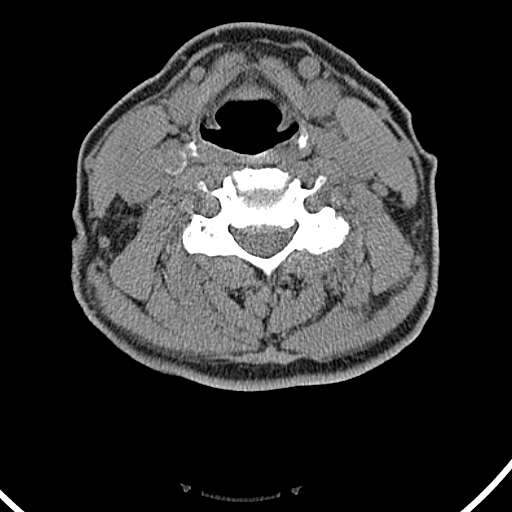
[im 32/83  bone]
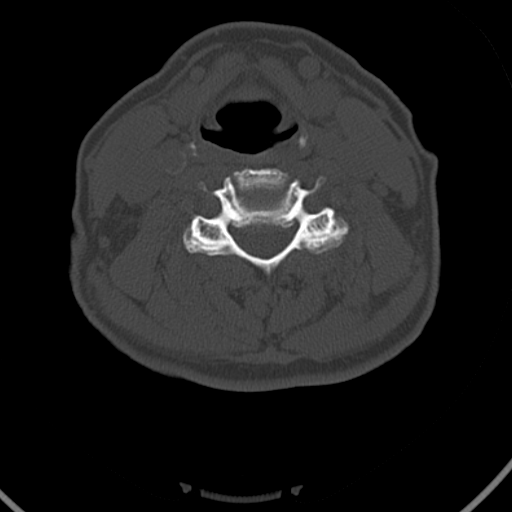
[im 38/83  bone]
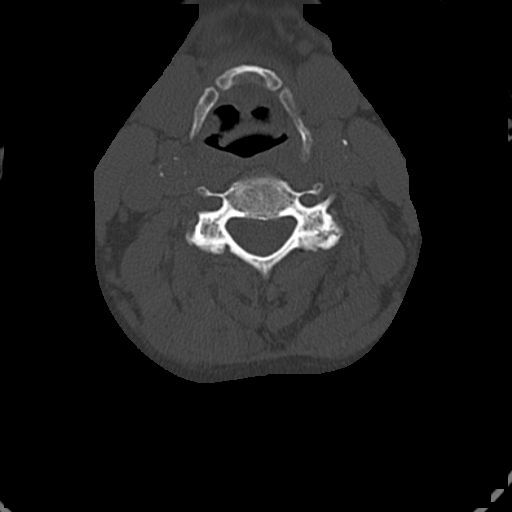
[im 45/83  bone]
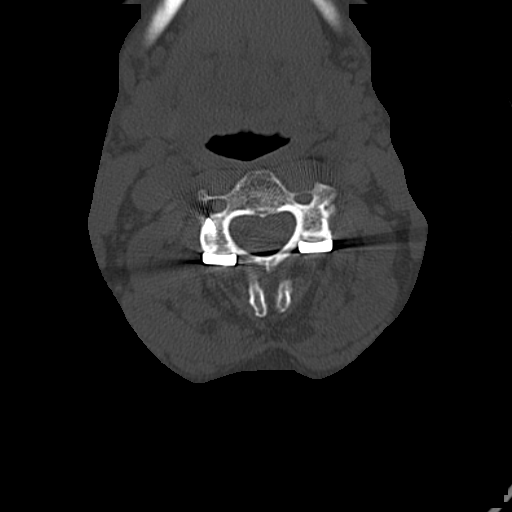
[im 51/83  bone]
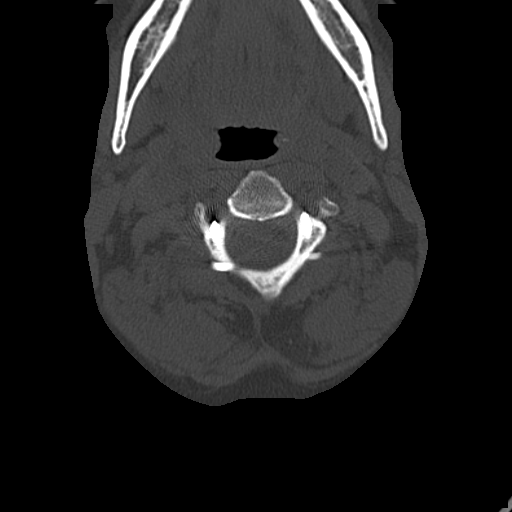
[im 57/83  soft-tissue]
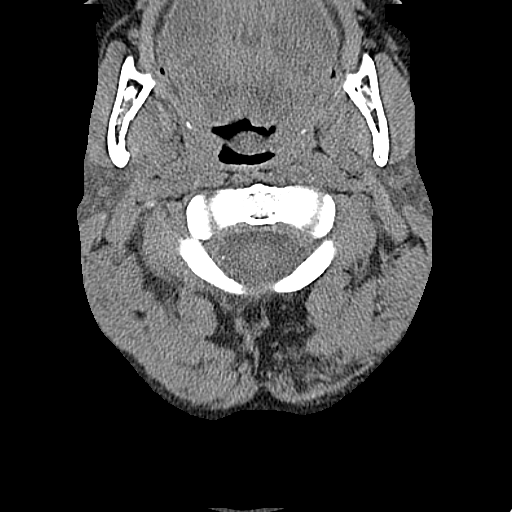
[im 57/83  bone]
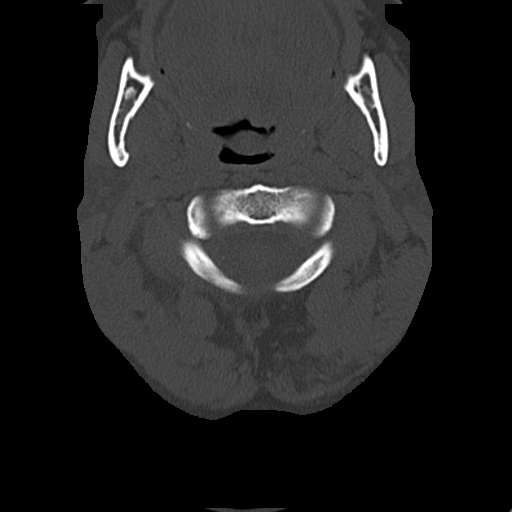
[im 64/83  bone]
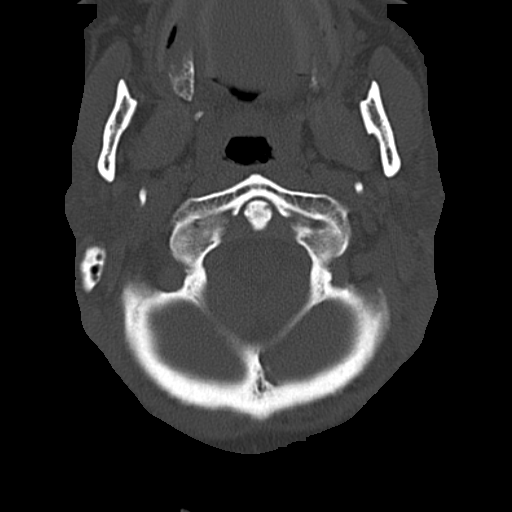
[im 70/83  bone]
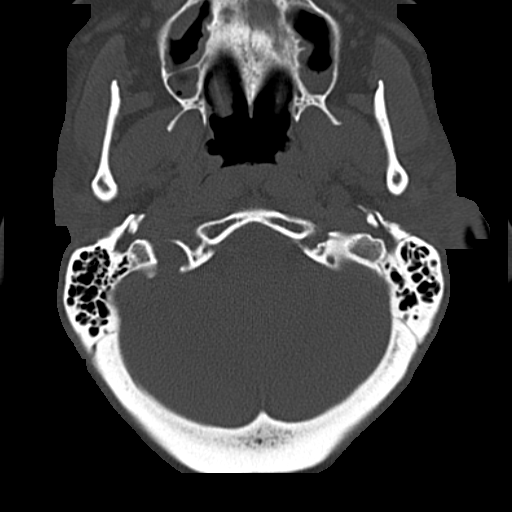
[im 76/83  bone]
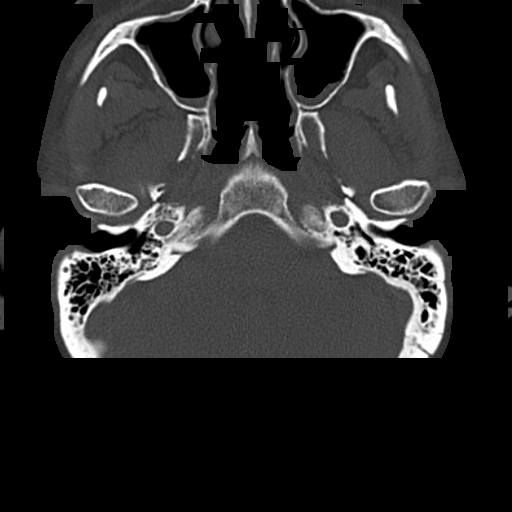

[12 of 14 positions shown; findings below may reference images not displayed]

PROCEDURE:     CT  - CT CERVICAL SPINE WO  - May 23, 2012  [DATE]

RESULT:     Sagittal, axial, and coronal images through the cervical spine
are reviewed. Comparison is made to study 18 February, 2012.

There is degenerative disc space narrowing at C4-C5, C5-C6, and C6-C7 which
appears stable. The prevertebral soft tissues are normal in appearance. The
vertebral bodies are preserved in height. The patient has undergone
posterior fusion at C2-C3. The spinous processes appear intact. There is
mild facet joint degenerative change at multiple levels. The odontoid is
intact. The lateral masses of C1 align normally with those of C2. The
observed portions of the first and second ribs are normal in appearance. The
bony ring at each cervical level is intact. The pulmonary apices exhibit
emphysematous changes.
IMPRESSION: 1. There is moderate stable appearing degenerative change of the cervical
spine. There is no evidence of an acute fracture nor dislocation.
2. There is evidence of previous posterior fusion across the C2-C3 disc.
3. There are emphysematous changes in the pulmonary apices.

[REDACTED]

## 2013-02-13 IMAGING — CT CT MAXILLOFACIAL WITH CONTRAST
1 series · 16 of 30 positions shown, 20 images · non-contrast
Comparison: none

REASON FOR EXAM: abscess L eyebrow, dizzy
COMMENTS:

[Series 3: soft tissue · axial · 0.32mm/px · z∈[-284,-134]mm · 16 of 54 slices shown, 20 images]
[im 2/54  brain]
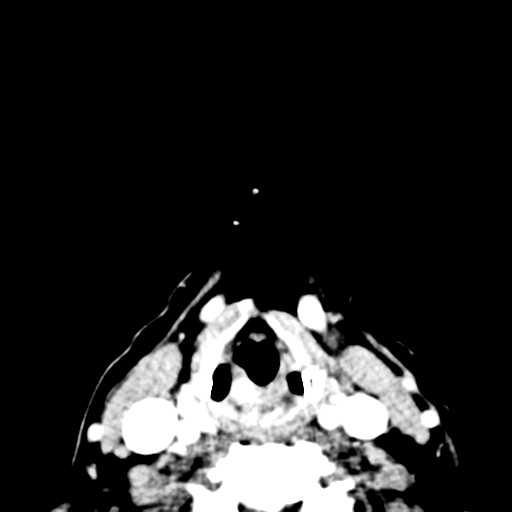
[im 2/54  bone]
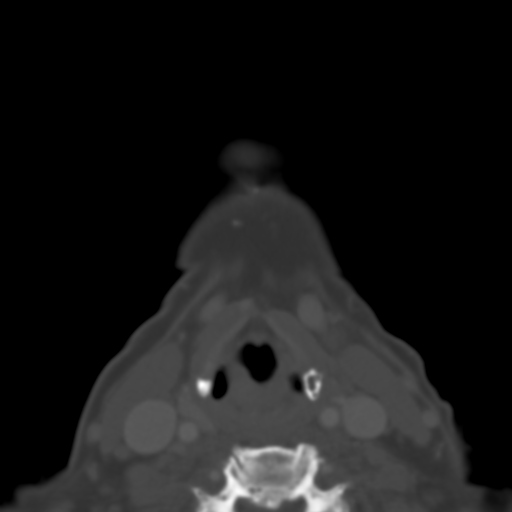
[im 6/54  bone]
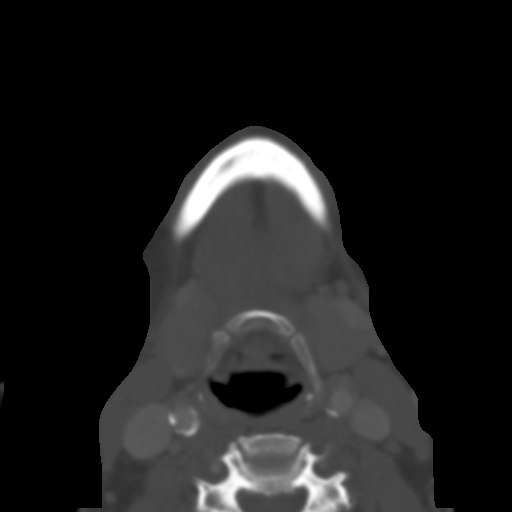
[im 10/54  bone]
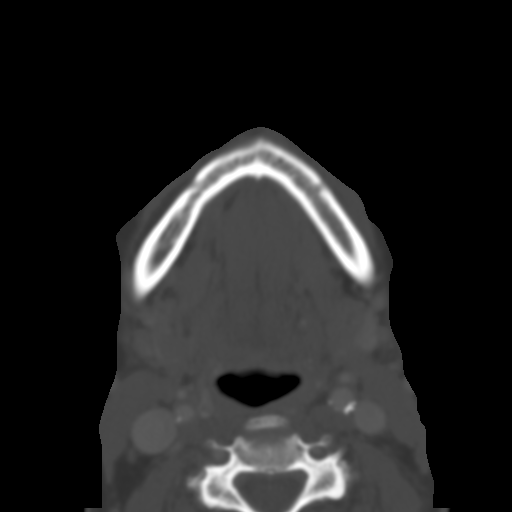
[im 13/54  bone]
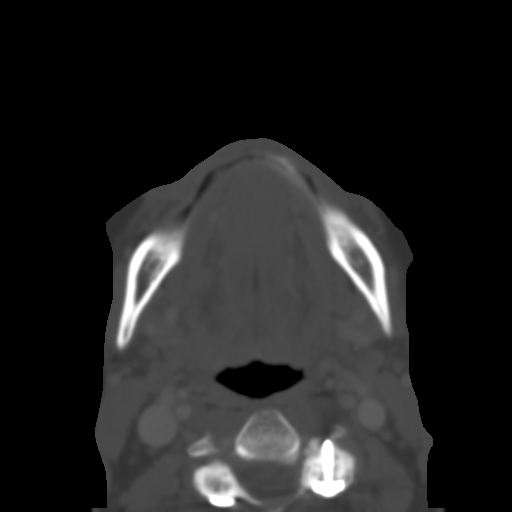
[im 15/54  brain]
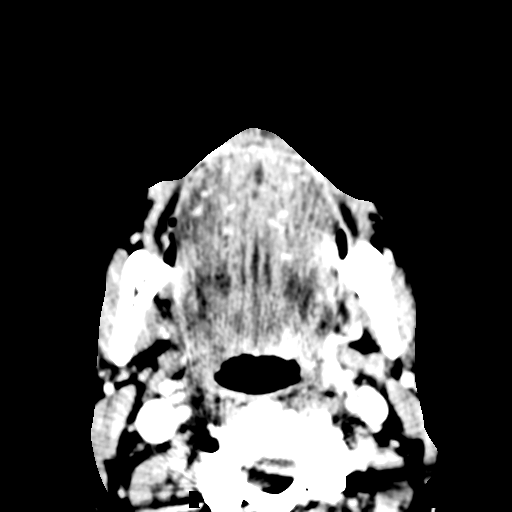
[im 15/54  bone]
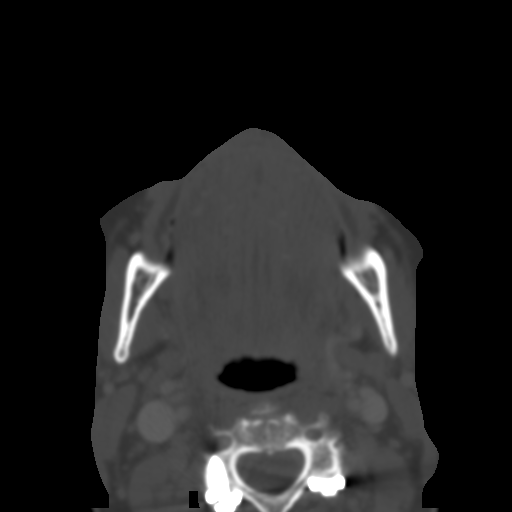
[im 19/54  bone]
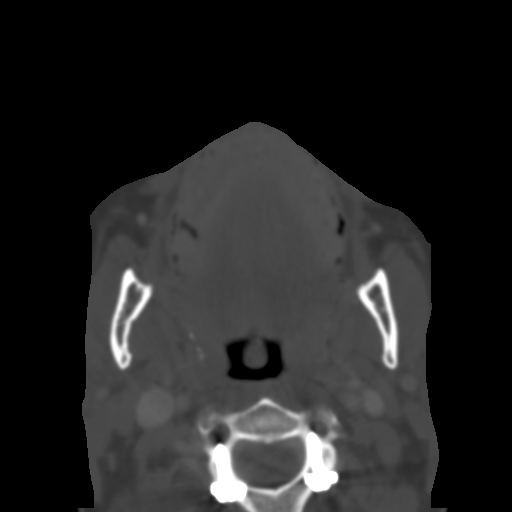
[im 22/54  bone]
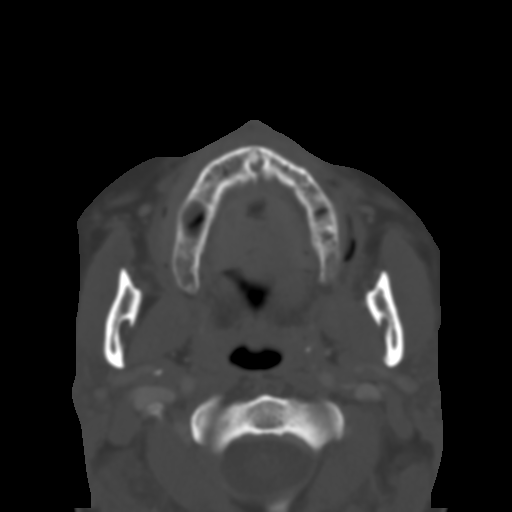
[im 26/54  bone]
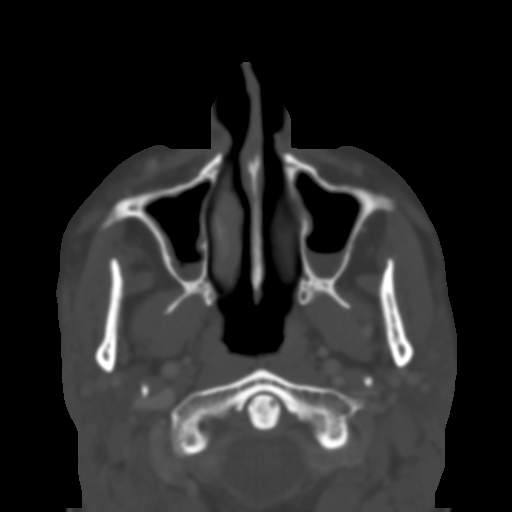
[im 28/54  brain]
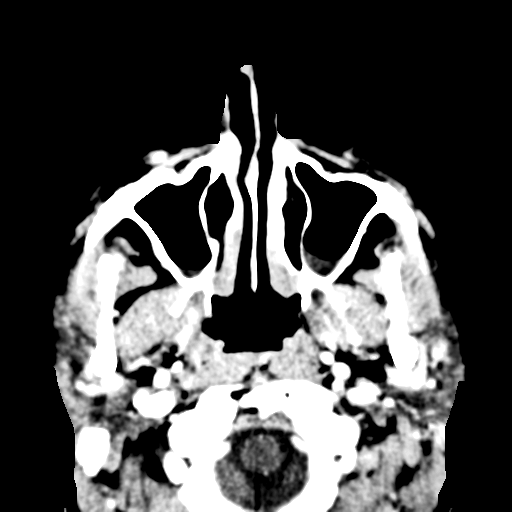
[im 28/54  bone]
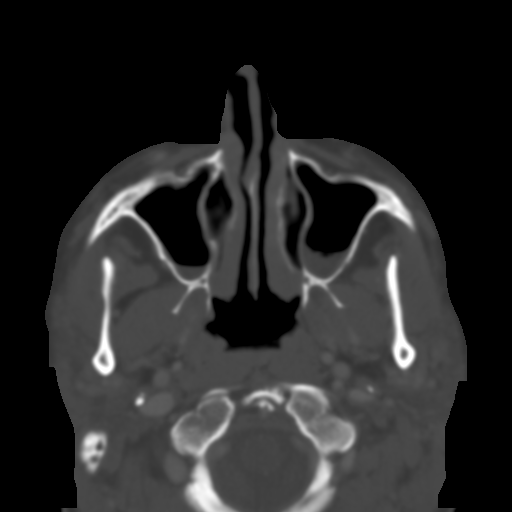
[im 32/54  bone]
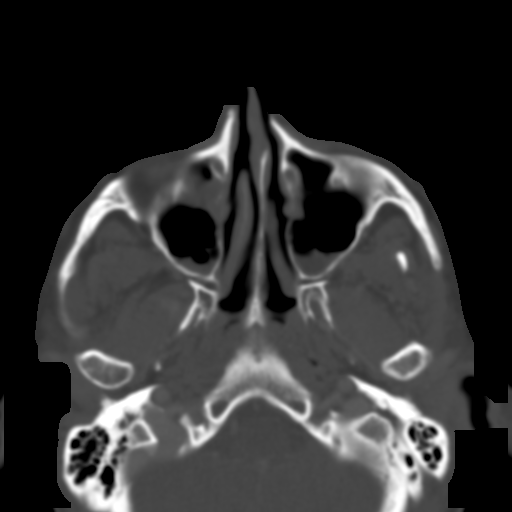
[im 35/54  bone]
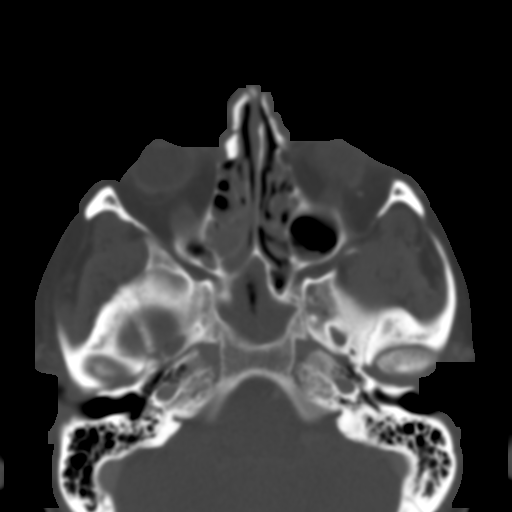
[im 39/54  bone]
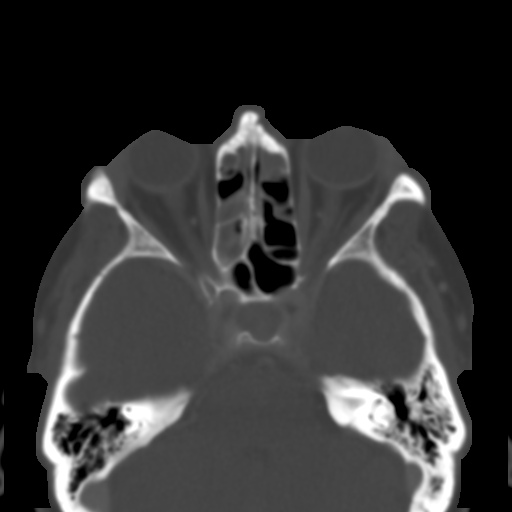
[im 41/54  brain]
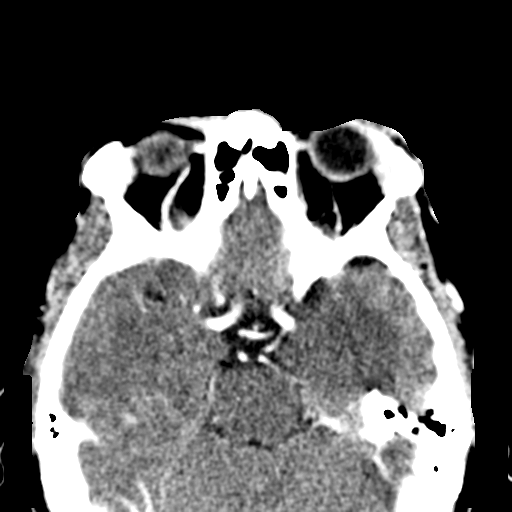
[im 41/54  bone]
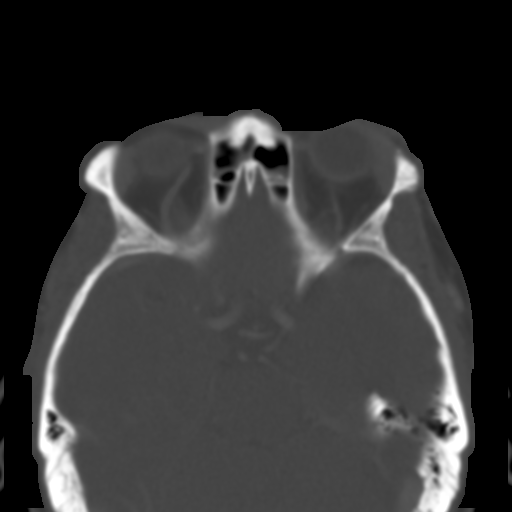
[im 44/54  bone]
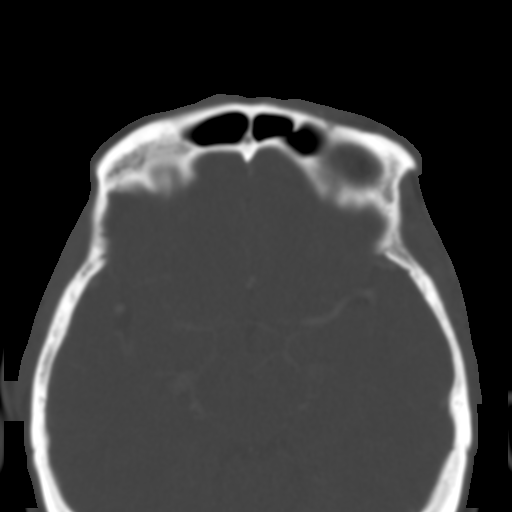
[im 48/54  bone]
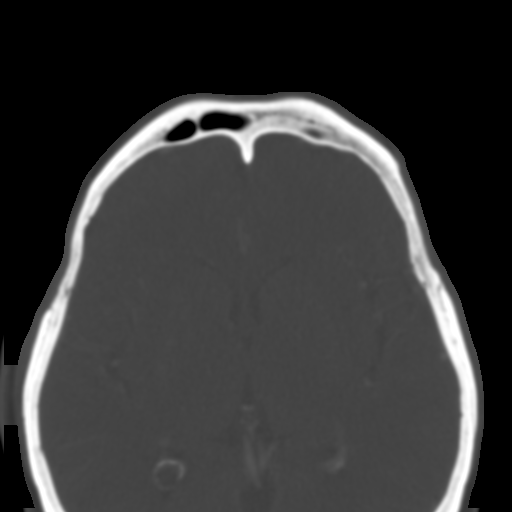
[im 52/54  bone]
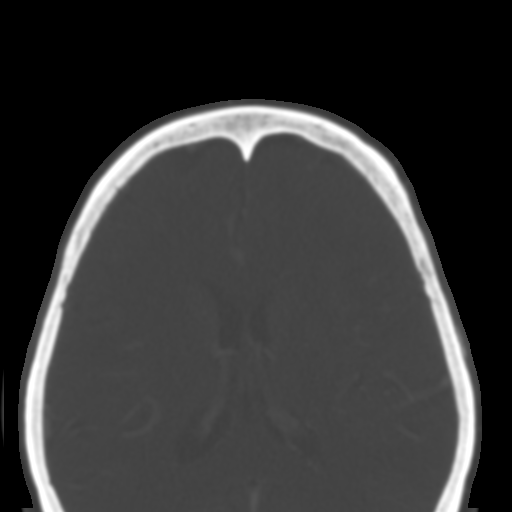

[16 of 30 positions shown; findings below may reference images not displayed]

PROCEDURE:     CT  - CT MAXILLOFACIAL AREA W  - May 23, 2012  [DATE]

RESULT:     Axial imaging was performed through the facial bones with
reconstructions at 3 mm intervals and slice thicknesses following
intravenous administration of 75 cc of [id number].

There is preseptal edema on the left. There is no postseptal edema. The
globe appears intact. There is fluid in the maxillary sinuses. There is
mucoperiosteal thickening within the ethmoid and sphenoid sinuses. The
mastoid air cells are well pneumatized. No lytic or blastic bony lesion is
demonstrated.
IMPRESSION: 1. There is preseptal edema on the left somewhat laterally. No soft tissue
gas collections are demonstrated. Subtle low density within the edematous
tissue may reflect a subcentimeter abscess collection. There is no post
septal edema.
2. There are inflammatory changes of the paranasal sinuses.

[REDACTED]

## 2013-03-07 ENCOUNTER — Encounter: Payer: Self-pay | Admitting: Family Medicine

## 2013-03-18 IMAGING — CR DG LUMBAR SPINE 2-3V
1 series · 3 of 3 positions shown · non-contrast
Comparison: none

REASON FOR EXAM: fall
COMMENTS:

[Series 1: t lumbar spine ap · 0.14mm/px · 3 of 3 slices shown]
[im 1/3]
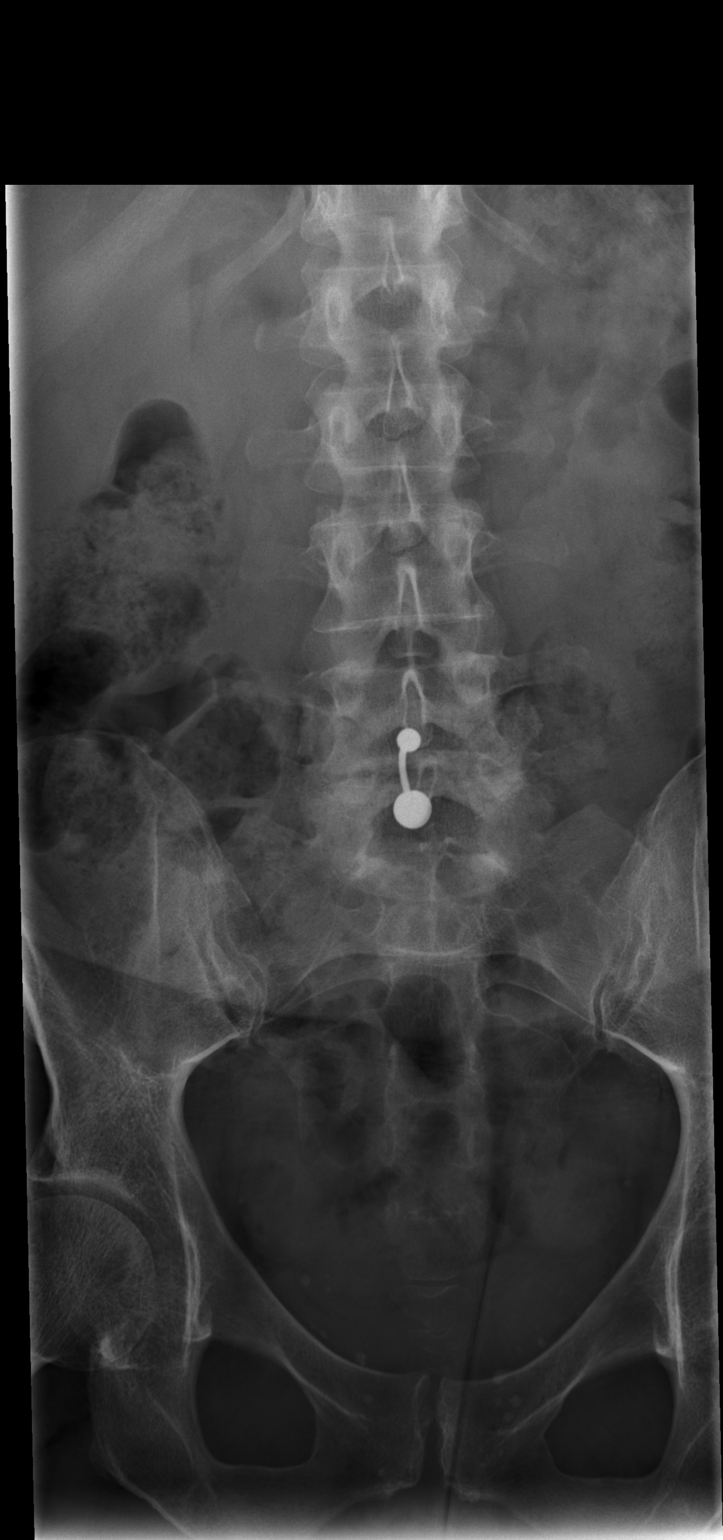
[im 2/3]
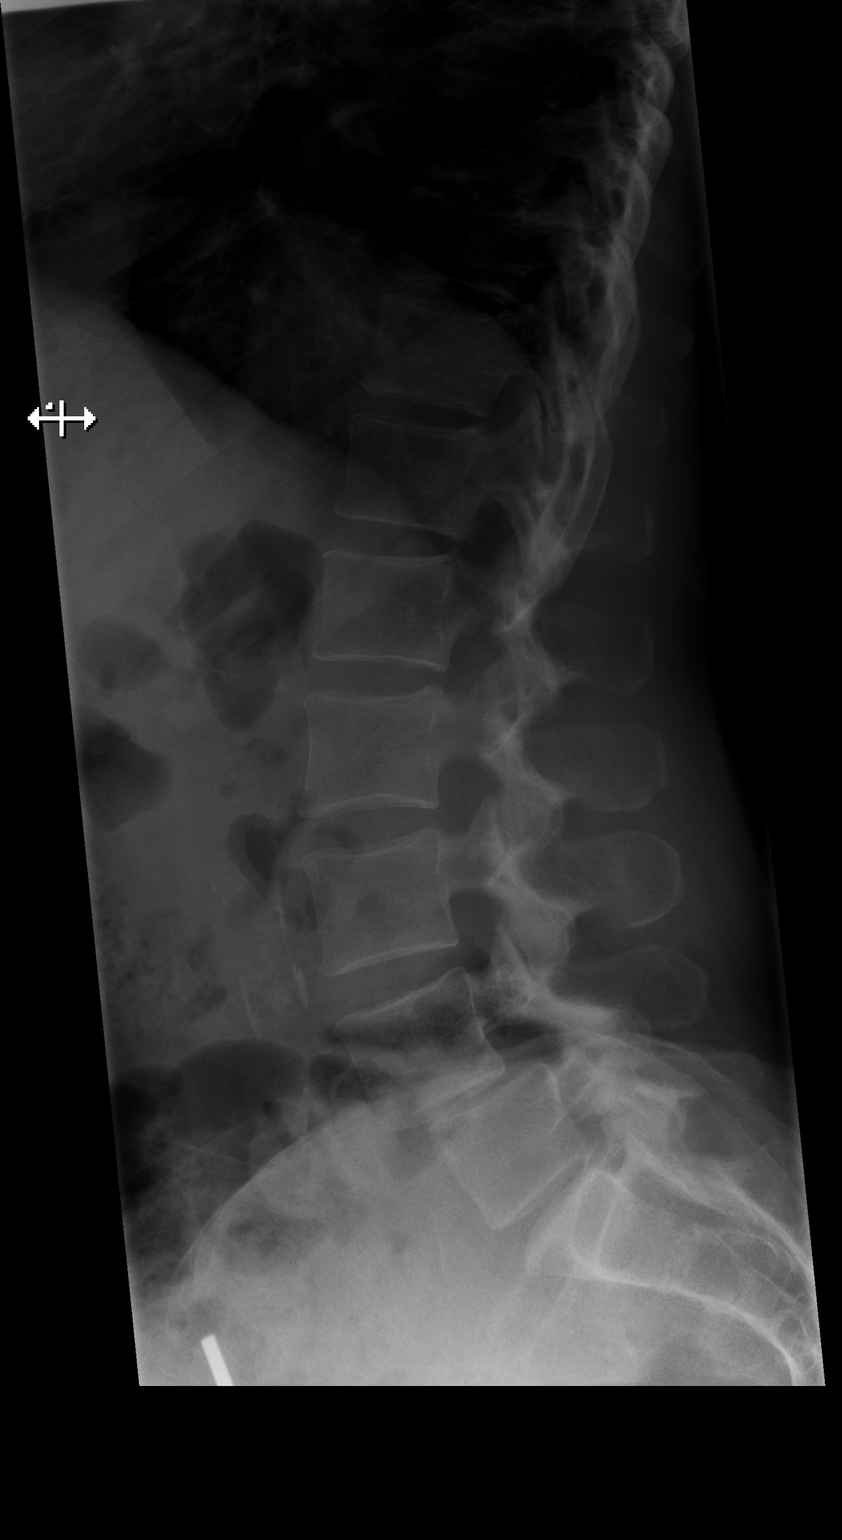
[im 3/3]
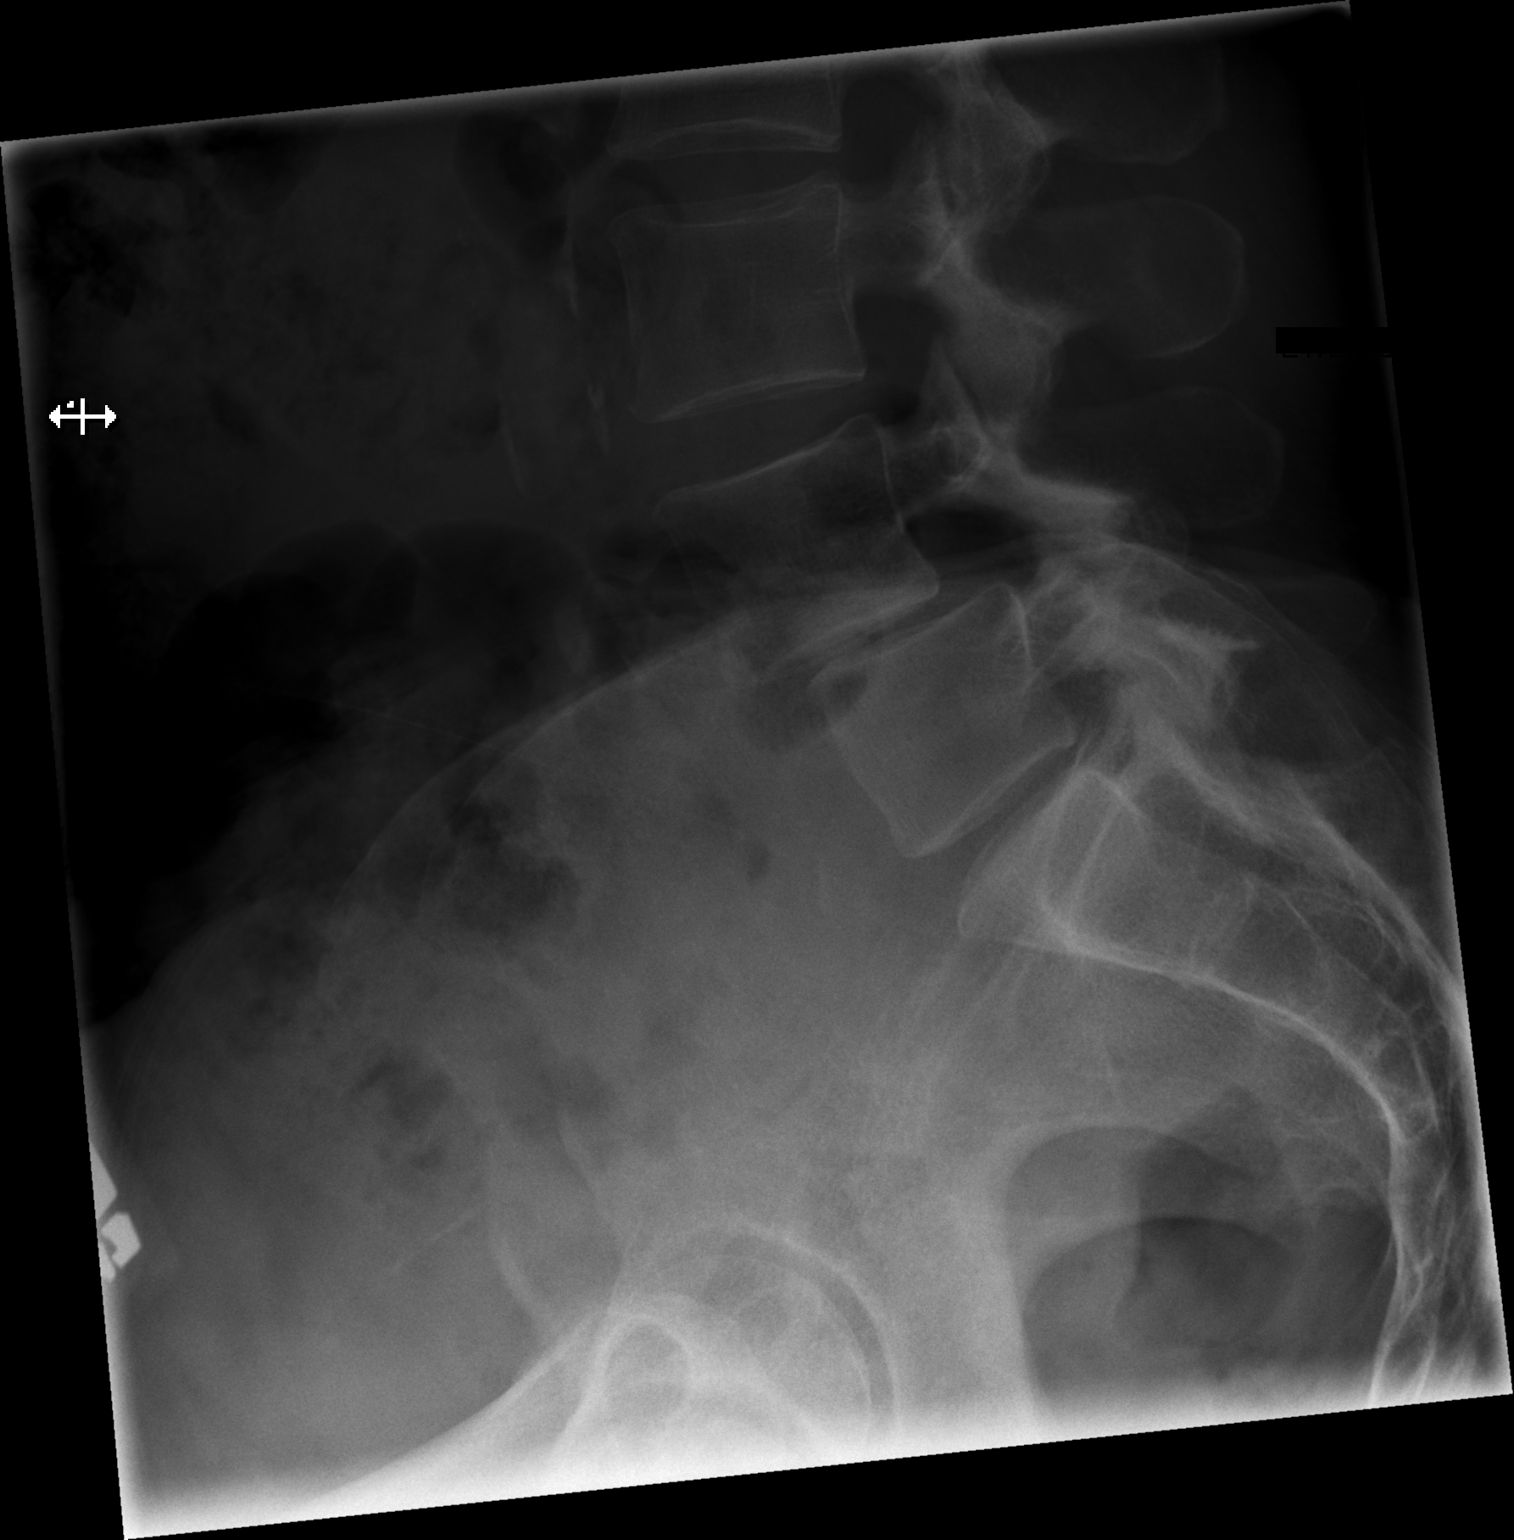

[3 of 3 positions shown; findings below may reference images not displayed]

PROCEDURE:     DXR - DXR LUMBAR SPINE AP AND LATERAL  - June 25, 2012  [DATE]

RESULT:     This study is compared to previous study dated 02/18/2012.

There is no evidence of acute fracture or dislocation. Grade 1
anterolisthesis is appreciated at the L4-L5 level which when compared to
previous study is unchanged. Degenerative disc disease change is appreciated
at L5-S1.
IMPRESSION: Grade 1 anterolisthesis, stable at L4-L5. This raises
suspicion of a pars abnormality. Degenerative disc disease change is
appreciated at L4-L5 and L5-S1. If clinically warranted, further evaluation
with cross-sectional imaging is recommended.

## 2013-03-23 ENCOUNTER — Emergency Department: Payer: Self-pay | Admitting: Emergency Medicine

## 2013-03-23 LAB — COMPREHENSIVE METABOLIC PANEL
Albumin: 3.8 g/dL (ref 3.4–5.0)
Anion Gap: 5 — ABNORMAL LOW (ref 7–16)
BUN: 17 mg/dL (ref 7–18)
Bilirubin,Total: 0.2 mg/dL (ref 0.2–1.0)
Calcium, Total: 10.1 mg/dL (ref 8.5–10.1)
Chloride: 105 mmol/L (ref 98–107)
EGFR (African American): >60
Glucose: 96 mg/dL (ref 65–99)
Potassium: 3.7 mmol/L (ref 3.5–5.1)
SGPT (ALT): 18 U/L (ref 12–78)
Sodium: 138 mmol/L (ref 136–145)
Total Protein: 7.7 g/dL (ref 6.4–8.2)

## 2013-03-23 LAB — CBC
HCT: 37.6 % (ref 35.0–47.0)
HGB: 12.6 g/dL (ref 12.0–16.0)
MCHC: 33.4 g/dL (ref 32.0–36.0)
MCV: 89 fL (ref 80–100)
Platelet: 346 10*3/uL (ref 150–440)
RDW: 14.8 % — ABNORMAL HIGH (ref 11.5–14.5)
WBC: 12.1 10*3/uL — ABNORMAL HIGH (ref 3.6–11.0)

## 2013-03-23 LAB — URINALYSIS, COMPLETE
Bacteria: NONE SEEN
Bilirubin,UR: NEGATIVE
Blood: NEGATIVE
Ketone: NEGATIVE
Nitrite: NEGATIVE
Specific Gravity: 1.024 (ref 1.003–1.030)
Squamous Epithelial: <1

## 2013-03-23 LAB — CK: CK, Total: 518 U/L — ABNORMAL HIGH (ref 21–215)

## 2013-03-23 LAB — TROPONIN I: Troponin-I: <0.02 ng/mL

## 2013-03-23 LAB — PROTIME-INR: Prothrombin Time: 13 secs (ref 11.5–14.7)

## 2013-04-06 ENCOUNTER — Encounter: Payer: Self-pay | Admitting: Anesthesiology

## 2013-04-07 ENCOUNTER — Emergency Department: Payer: Self-pay | Admitting: Emergency Medicine

## 2013-04-07 LAB — COMPREHENSIVE METABOLIC PANEL
Albumin: 3.6 g/dL (ref 3.4–5.0)
Alkaline Phosphatase: 124 U/L (ref 50–136)
Anion Gap: 4 — ABNORMAL LOW (ref 7–16)
Bilirubin,Total: 0.3 mg/dL (ref 0.2–1.0)
Chloride: 103 mmol/L (ref 98–107)
Co2: 25 mmol/L (ref 21–32)
Creatinine: 1.52 mg/dL — ABNORMAL HIGH (ref 0.60–1.30)
EGFR (African American): 45 — ABNORMAL LOW
EGFR (Non-African Amer.): 38 — ABNORMAL LOW
Glucose: 111 mg/dL — ABNORMAL HIGH (ref 65–99)
Osmolality: 275 (ref 275–301)
Potassium: 4.2 mmol/L (ref 3.5–5.1)
SGOT(AST): 91 U/L — ABNORMAL HIGH (ref 15–37)

## 2013-04-07 LAB — URINALYSIS, COMPLETE
Glucose,UR: NEGATIVE mg/dL (ref 0–75)
Leukocyte Esterase: NEGATIVE
Nitrite: NEGATIVE
Ph: 5 (ref 4.5–8.0)
Specific Gravity: 1.018 (ref 1.003–1.030)
Squamous Epithelial: 3

## 2013-04-07 LAB — CBC
HCT: 33.6 % — ABNORMAL LOW (ref 35.0–47.0)
HGB: 11.7 g/dL — ABNORMAL LOW (ref 12.0–16.0)
MCH: 30.6 pg (ref 26.0–34.0)
MCHC: 34.9 g/dL (ref 32.0–36.0)
Platelet: 354 10*3/uL (ref 150–440)
RBC: 3.84 10*6/uL (ref 3.80–5.20)

## 2013-04-20 ENCOUNTER — Emergency Department: Payer: Self-pay

## 2013-04-20 LAB — URINALYSIS, COMPLETE
Bacteria: NONE SEEN
Bilirubin,UR: NEGATIVE
Blood: NEGATIVE
Glucose,UR: NEGATIVE mg/dL (ref 0–75)
Hyaline Cast: 5
Ketone: NEGATIVE
Leukocyte Esterase: NEGATIVE
Nitrite: NEGATIVE
Ph: 5 (ref 4.5–8.0)
Protein: NEGATIVE
RBC,UR: <1 /HPF (ref 0–5)
Specific Gravity: 1.02 (ref 1.003–1.030)
Squamous Epithelial: 1
WBC UR: 1 /HPF (ref 0–5)

## 2013-05-08 ENCOUNTER — Emergency Department: Payer: Self-pay | Admitting: Emergency Medicine

## 2013-05-08 ENCOUNTER — Ambulatory Visit: Payer: Self-pay | Admitting: Orthopedic Surgery

## 2013-05-09 IMAGING — CR DG CHEST 1V PORT
1 series · 1 of 1 positions shown · non-contrast
Comparison: none

REASON FOR EXAM: hypoxia, chf?
COMMENTS:

[ap]
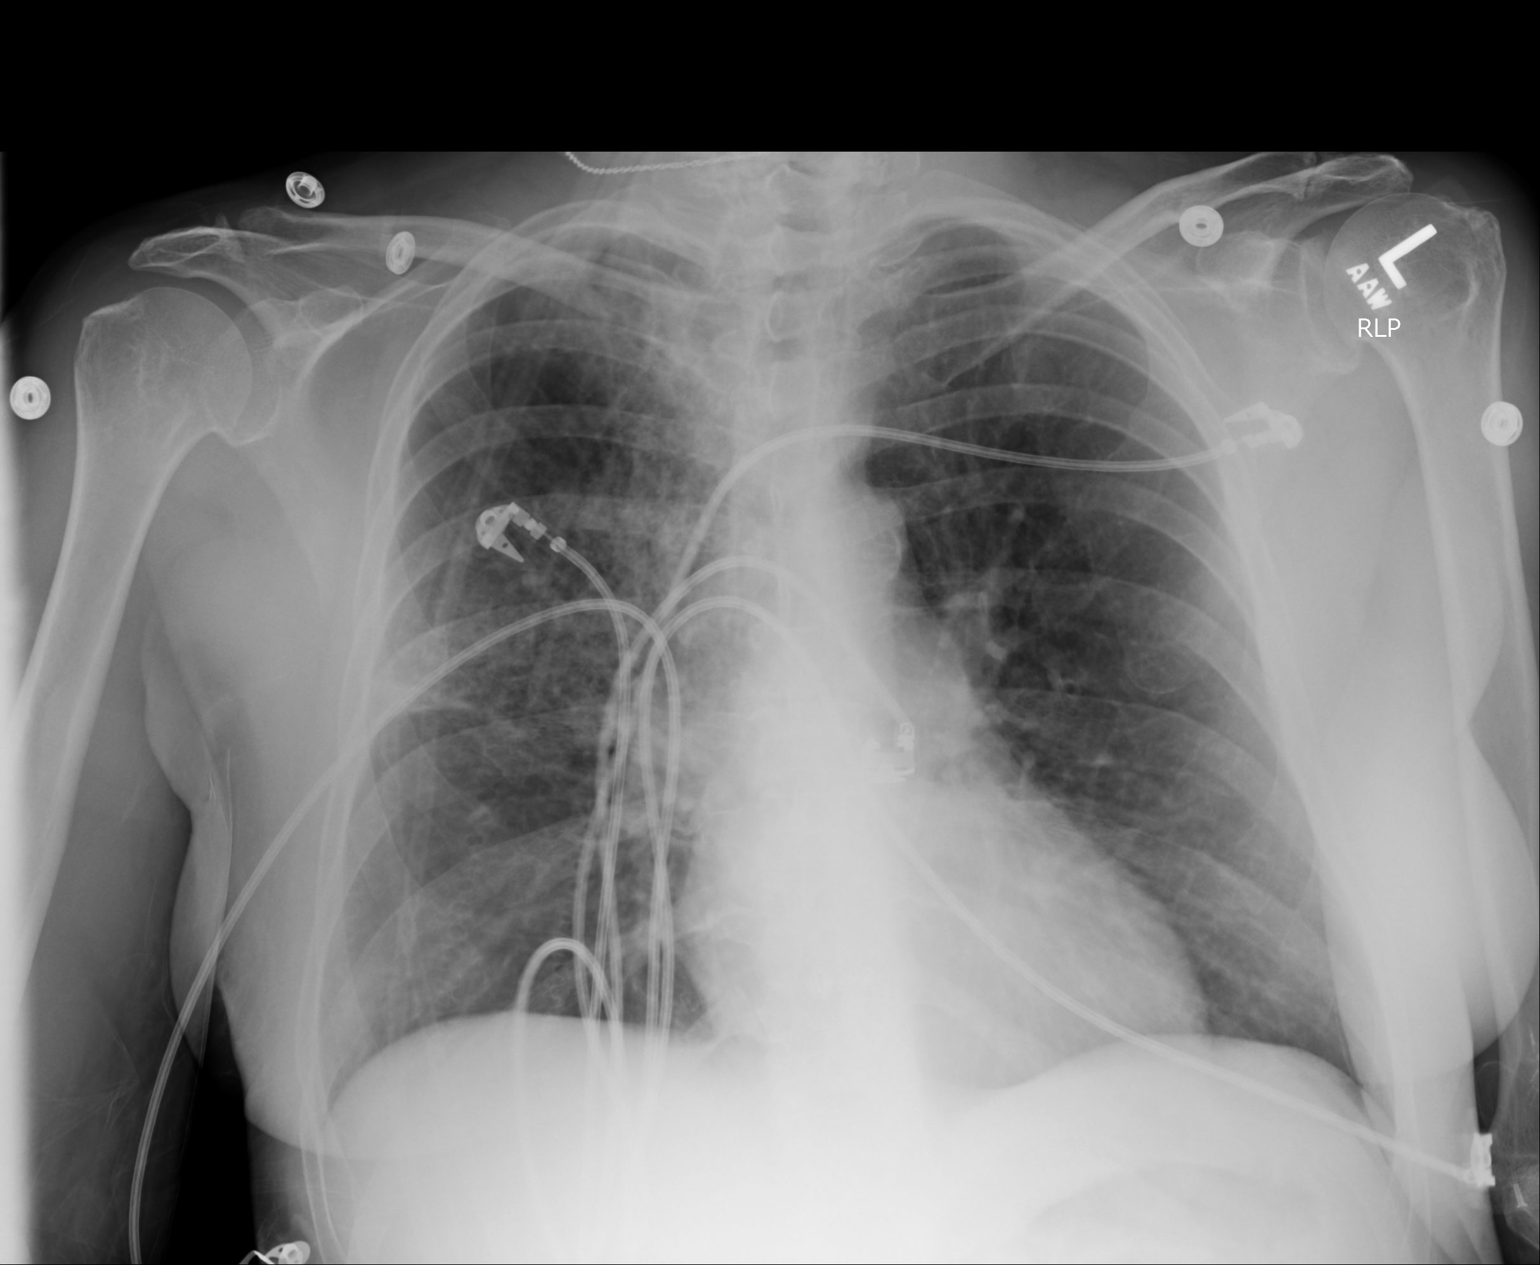

[1 of 1 positions shown; findings below may reference images not displayed]

PROCEDURE:     DXR - DXR PORTABLE CHEST SINGLE VIEW  - August 16, 2012  [DATE]

RESULT:     Comparison is made to the study 15 August, 2012.

The lungs are the essentially clear with apparent improved ventilation in
the inferior right upper lobe. The heart and pulmonary vessels are normal.
The bony and mediastinal structures are unremarkable. There is no effusion.
There is no pneumothorax or evidence of congestive failure. There is
significantly improved aeration in the right midlung. No definite residual
consolidation is present.
IMPRESSION: Predominant clearing of the patchy density in the right
upper lobe. Continued followup to assess for underlying pneumonia versus
atelectasis is suggested.

[REDACTED]

## 2013-05-10 IMAGING — CR DG CHEST 1V PORT
1 series · 1 of 1 positions shown · non-contrast
Comparison: none

REASON FOR EXAM: PNEUMONIA
COMMENTS:

[portable]
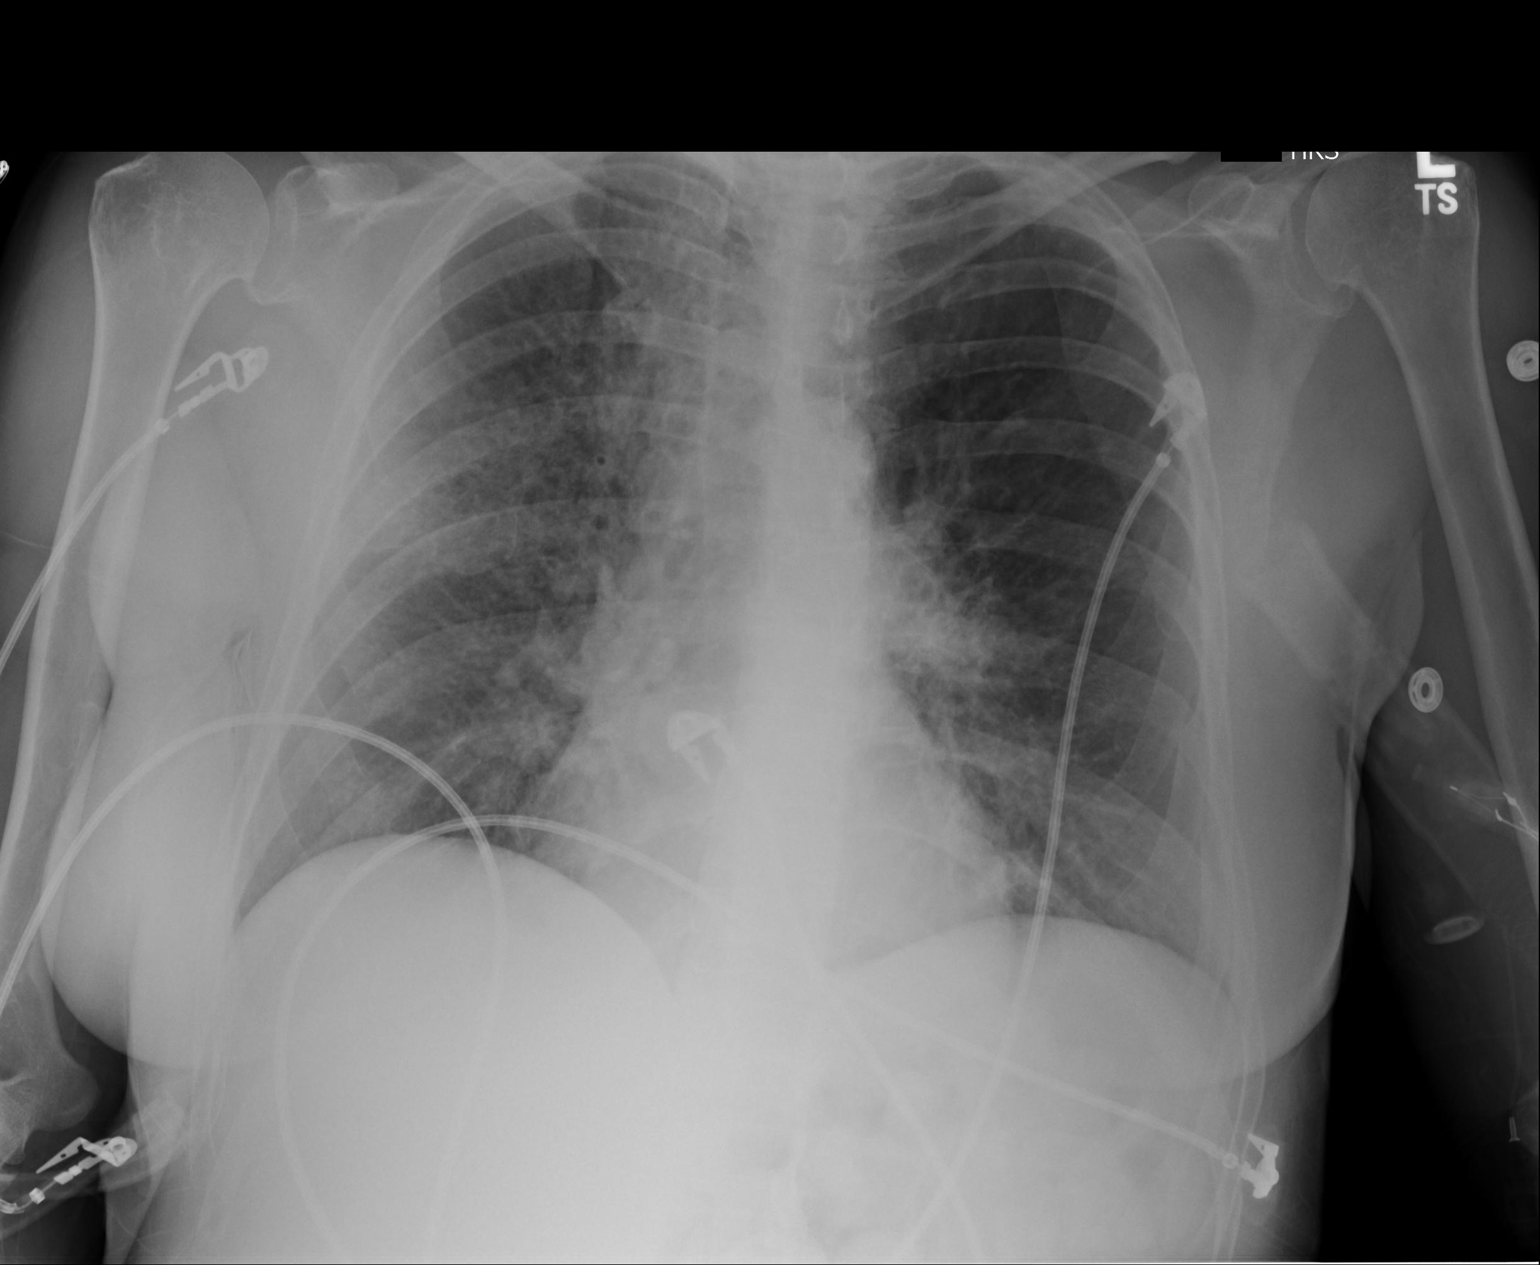

[1 of 1 positions shown; findings below may reference images not displayed]

PROCEDURE:     DXR - DXR PORTABLE CHEST SINGLE VIEW  - August 17, 2012  [DATE]

RESULT:     Comparison is made to the study August 16, 2012.

The pulmonary interstitial markings have increased since yesterday's study.
This is most conspicuous on the right and in the left perihilar region. The
cardiac silhouette is not enlarged. The pulmonary vascularity is not clearly
engorged. There is no pleural effusion.
IMPRESSION: There is increased conspicuity of the interstitial markings
in the right lung and in the left perihilar region. This may reflect changes
in the patient's hydration status or could reflect progressive pneumonia.
There is no significant pleural fluid collection nor evidence of pulmonary
vascular congestion.

[REDACTED]

## 2013-05-19 IMAGING — US US EXTREM LOW VENOUS BILAT
1 series · 14 of 24 positions shown · non-contrast
Comparison: none

REASON FOR EXAM: LE SWELLING AND PAIN
COMMENTS:

[Series 1: us extrem low venous bilat · 0.07mm/px · 14 of 46 slices shown]
[im 1/46]
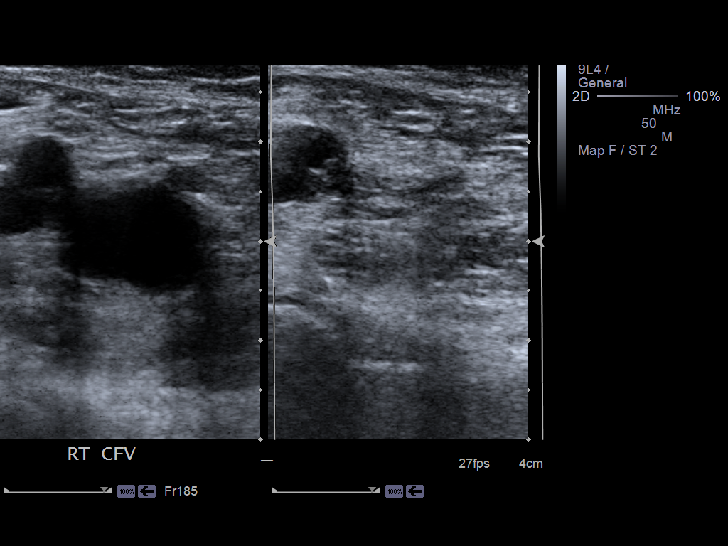
[im 4/46]
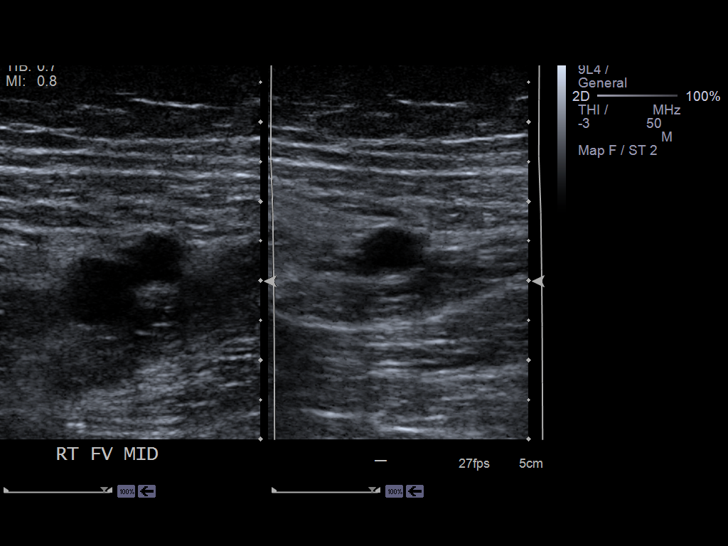
[im 8/46]
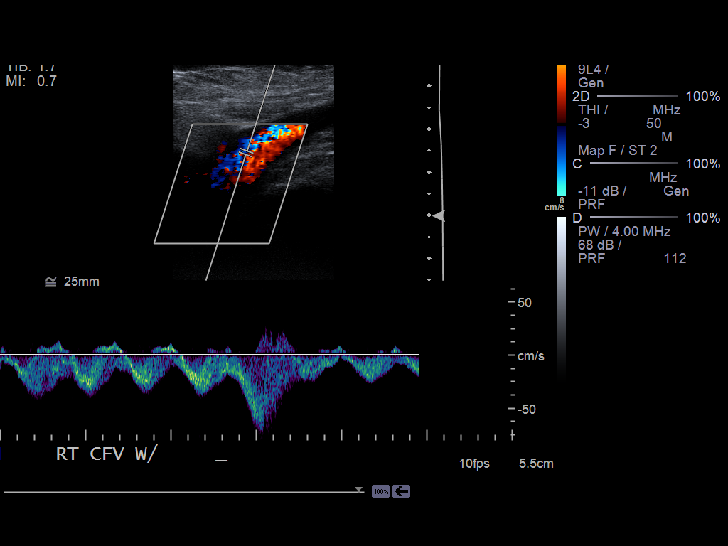
[im 12/46]
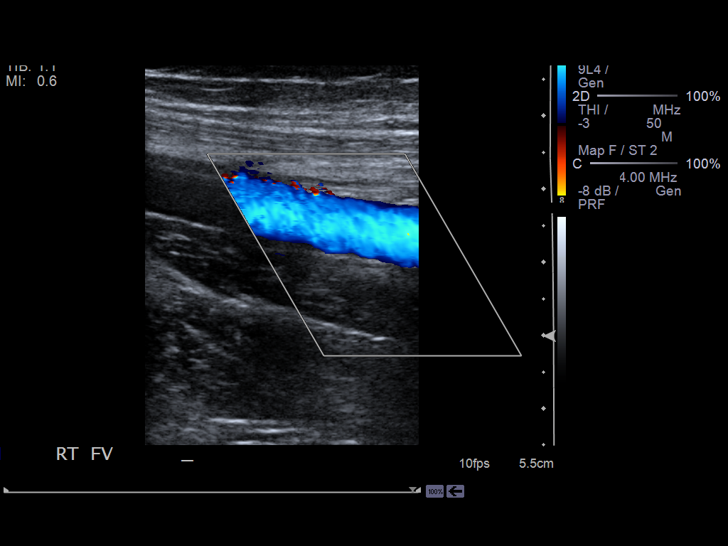
[im 14/46]
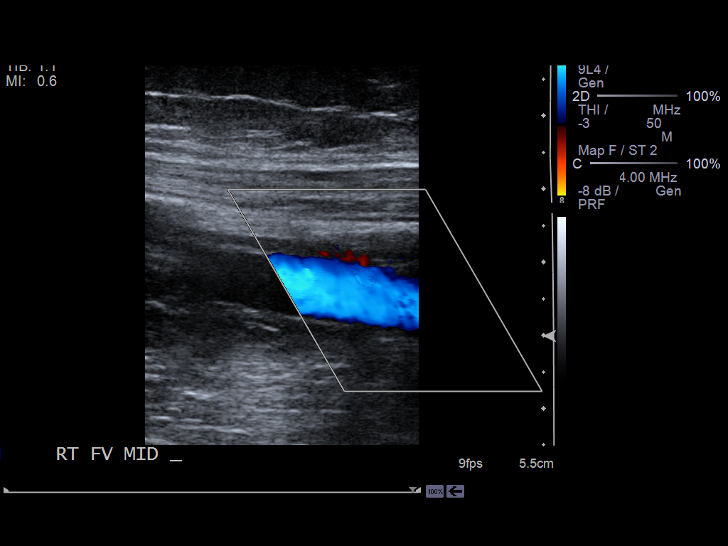
[im 18/46]
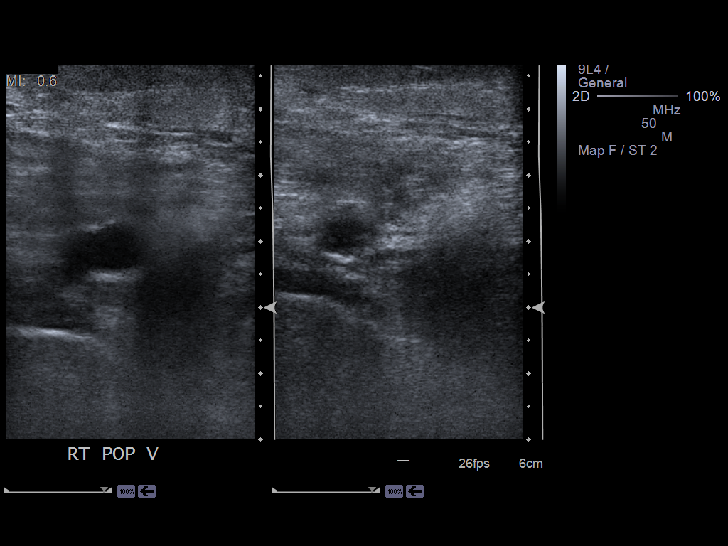
[im 22/46]
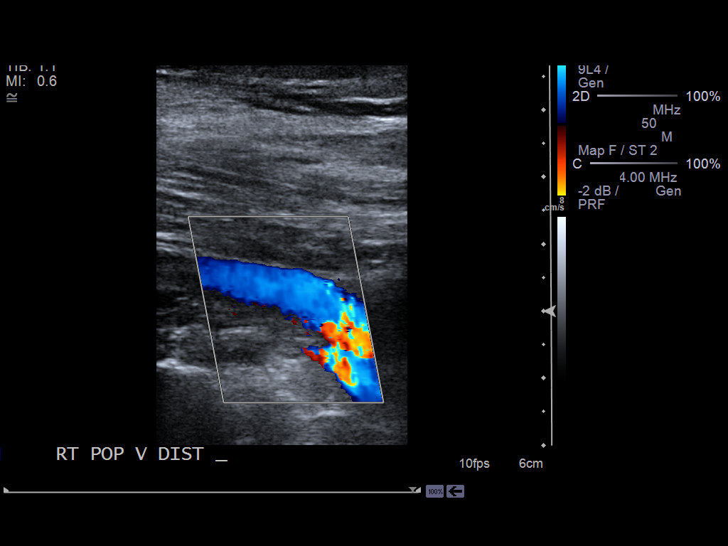
[im 24/46]
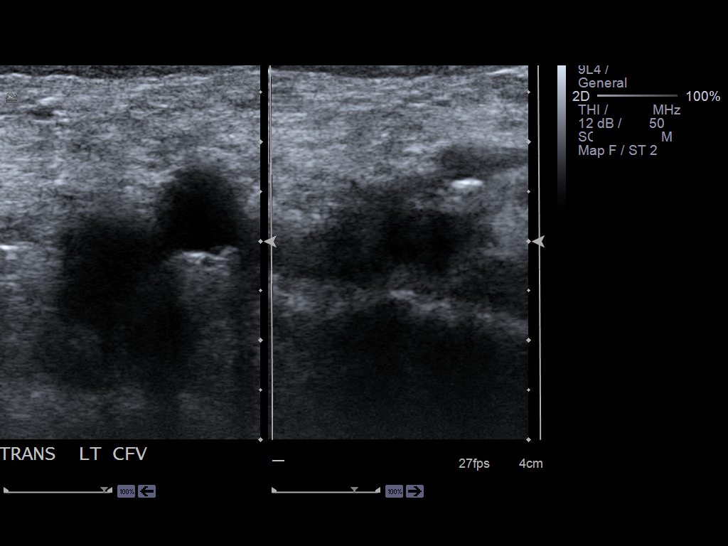
[im 28/46]
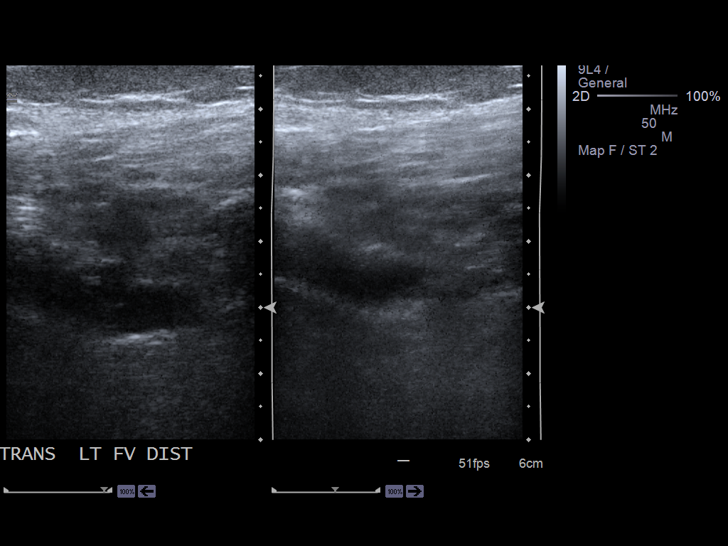
[im 32/46]
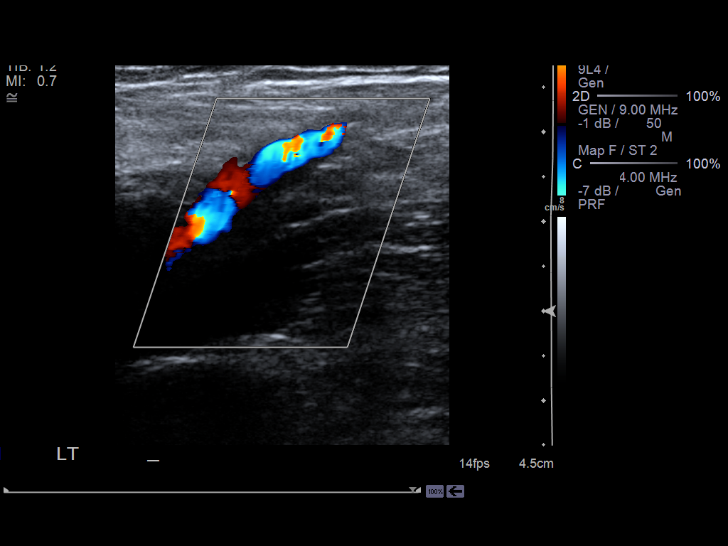
[im 36/46]
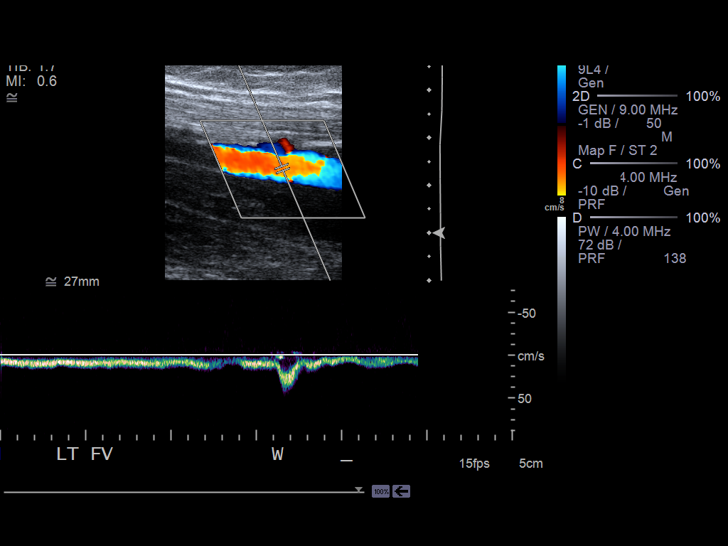
[im 38/46]
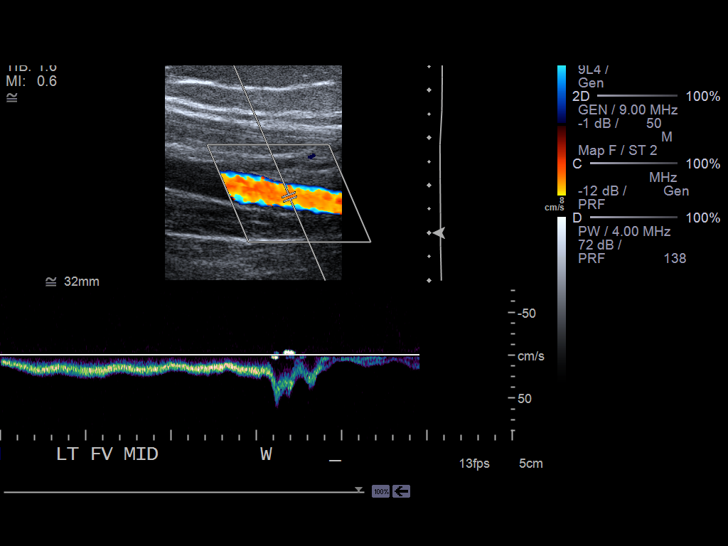
[im 42/46]
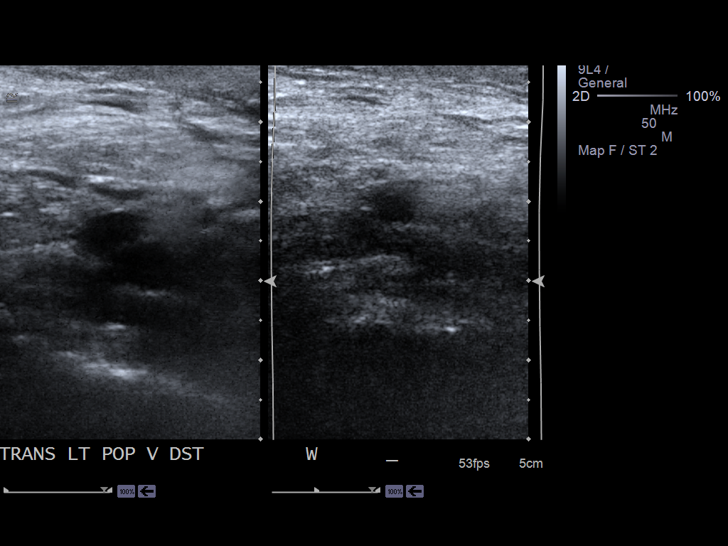
[im 46/46]
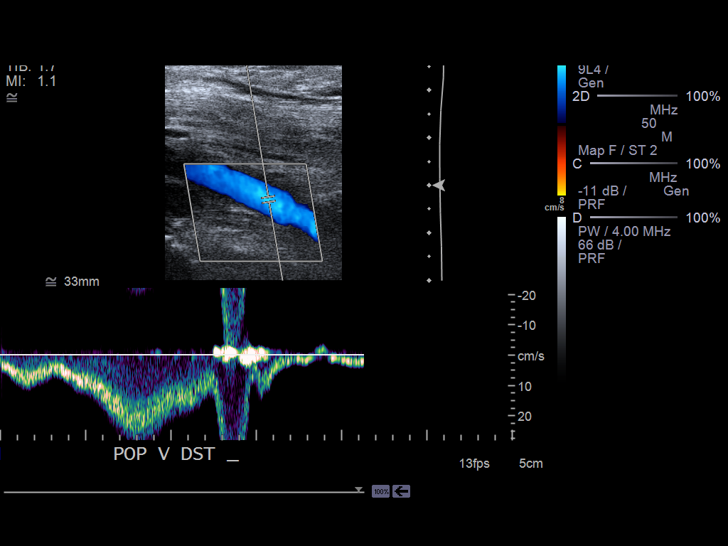

[14 of 24 positions shown; findings below may reference images not displayed]

PROCEDURE:     US  - US DOPPLER LOW EXTR BILATERAL  - August 26, 2012  [DATE]

RESULT:     The deep venous systems of the right and left lower extremities
were evaluated with grayscale and color flow Doppler techniques.

The common femoral, superficial femoral, and popliteal veins are normally
compressible bilaterally. The waveform patterns are normal and the color
flow images are normal. The response to the augmentation and Valsalva
maneuvers is normal.
IMPRESSION: There is no evidence of thrombus within the right or left
femoral or popliteal veins.

[REDACTED]

## 2013-05-19 IMAGING — CR DG CHEST 2V
1 series · 3 of 3 positions shown · non-contrast
Comparison: none

REASON FOR EXAM: SOB
COMMENTS:

[Series 1: pa · 0.17mm/px · 3 of 3 slices shown]
[im 1/3]
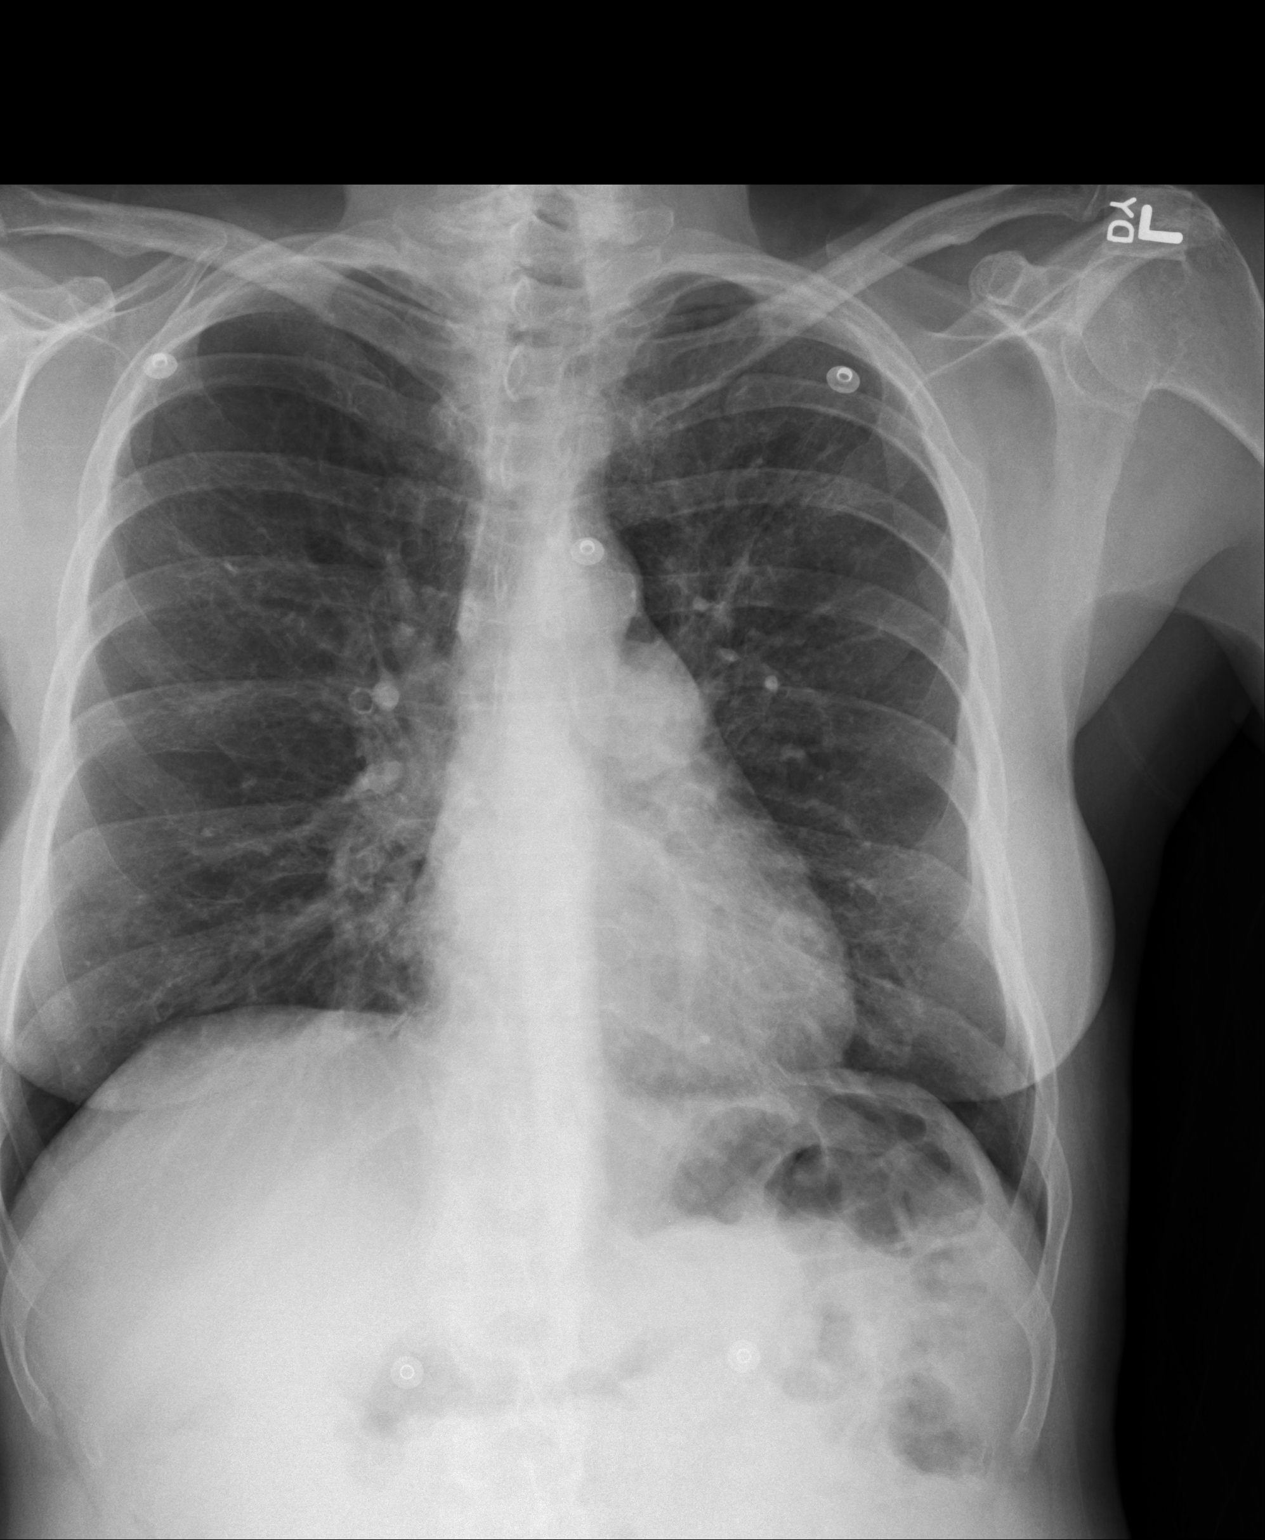
[im 2/3]
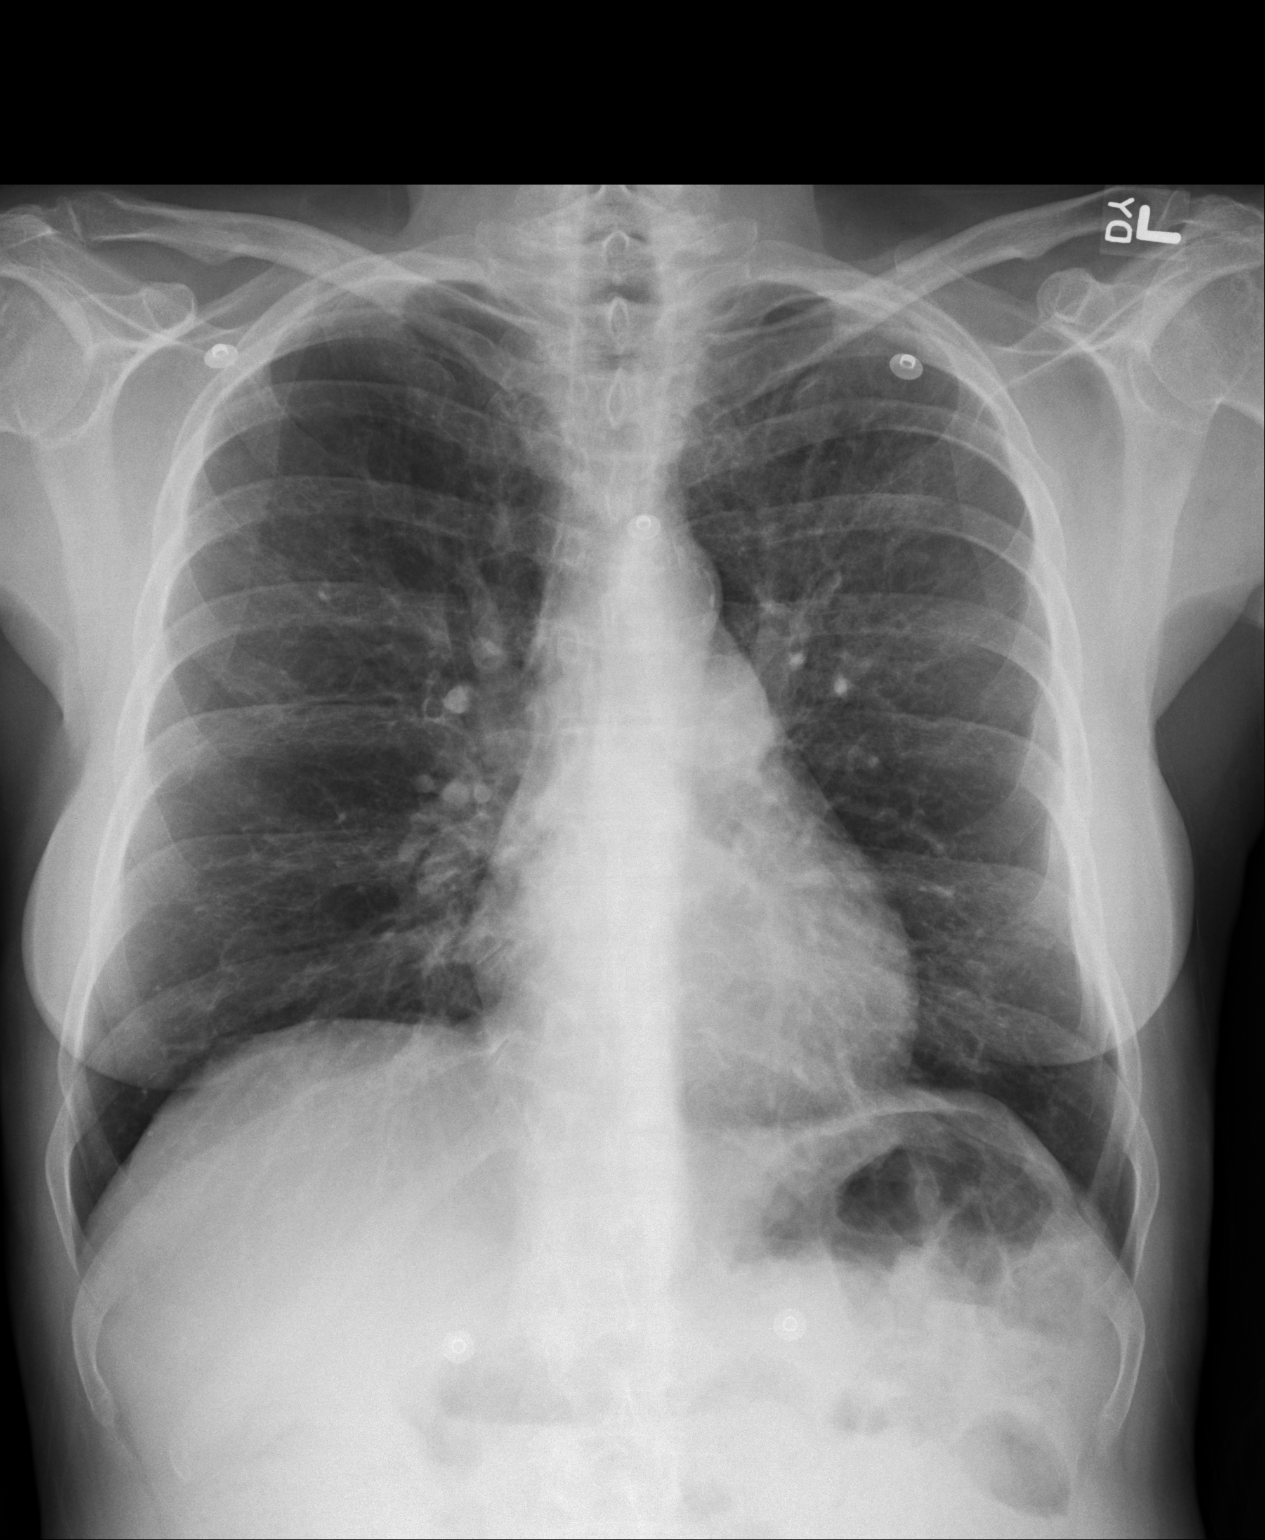
[im 3/3]
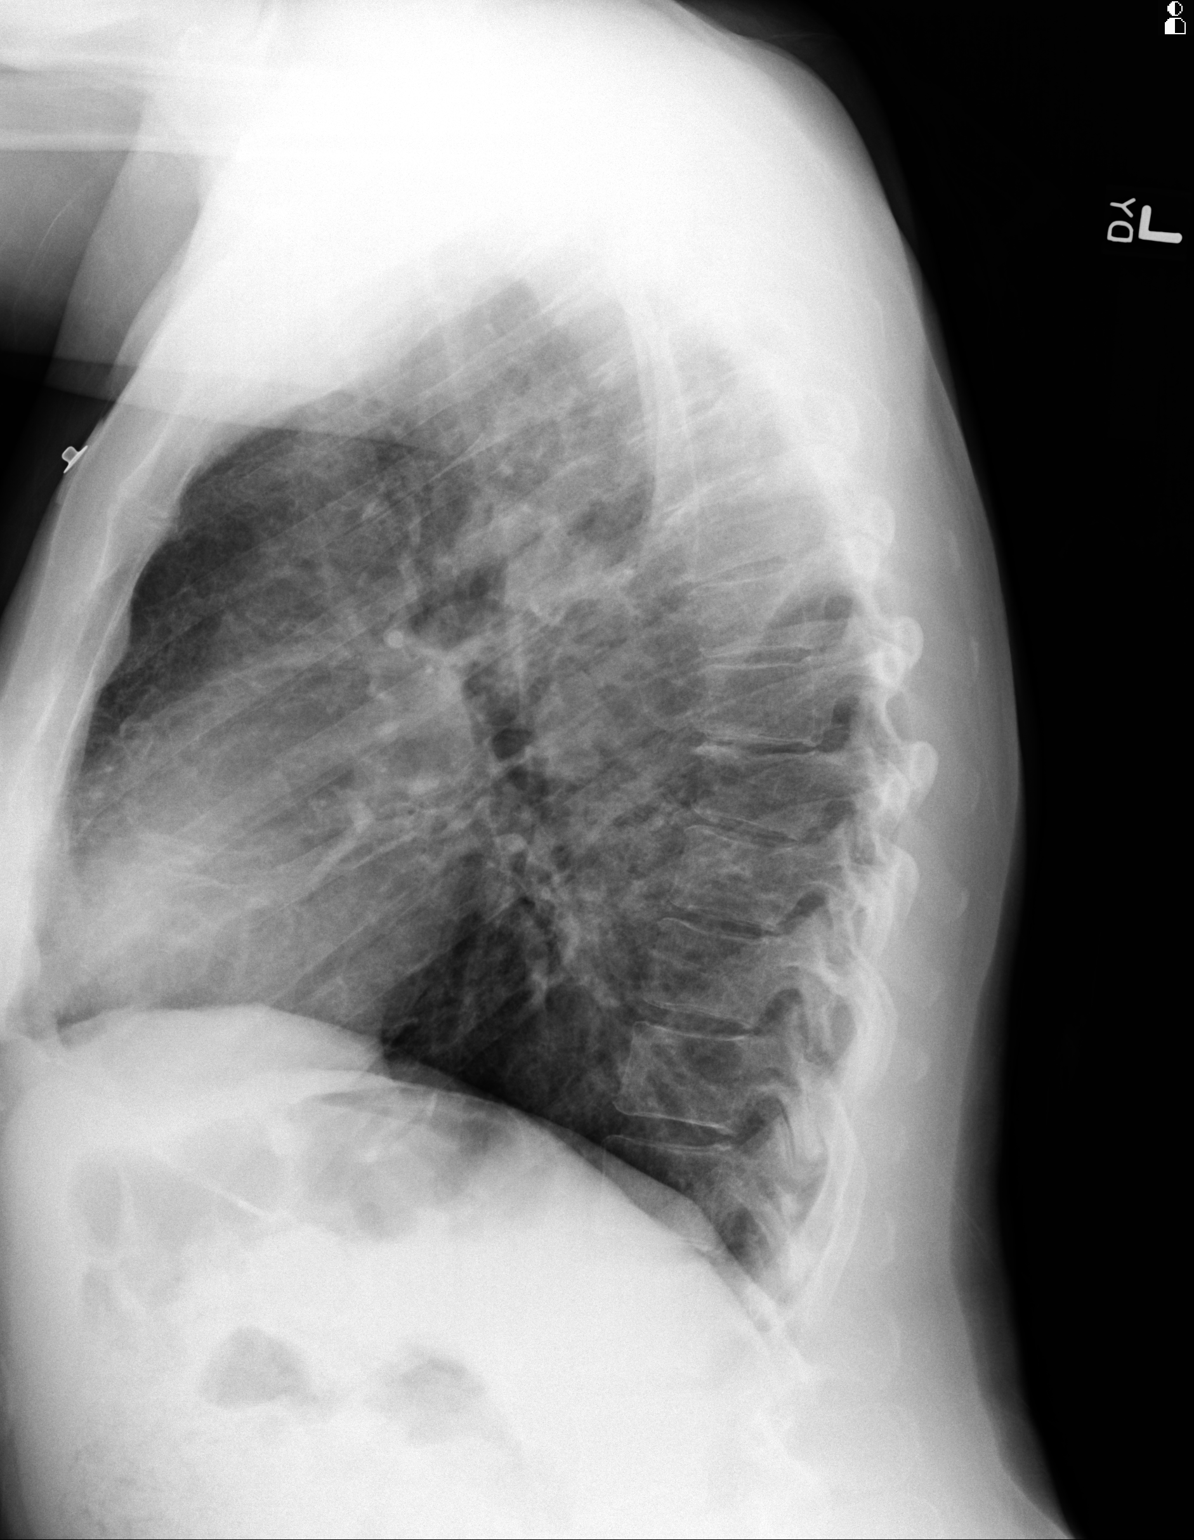

[3 of 3 positions shown; findings below may reference images not displayed]

PROCEDURE:     DXR - DXR CHEST PA (OR AP) AND LATERAL  - August 26, 2012 [DATE]

RESULT:     The lungs are mildly hyperinflated. The interstitial markings
are increased bilaterally. The cardiac silhouette is normal in size. There
is prominence of the central pulmonary vascularity. There is no pleural
effusion or pneumothorax. The bony thorax exhibits no acute abnormality.
IMPRESSION: There is no focal pneumonia. Increased interstitial
markings are not entirely new but they are more conspicuous than on [DATE]. This may reflect worsening of interstitial edema of cardiac or
noncardiac cause superimposed upon COPD.

[REDACTED]

## 2013-05-20 IMAGING — CR DG CHEST 1V PORT
1 series · 1 of 1 positions shown · non-contrast
Comparison: none

REASON FOR EXAM: central line placement
COMMENTS:

[ap]
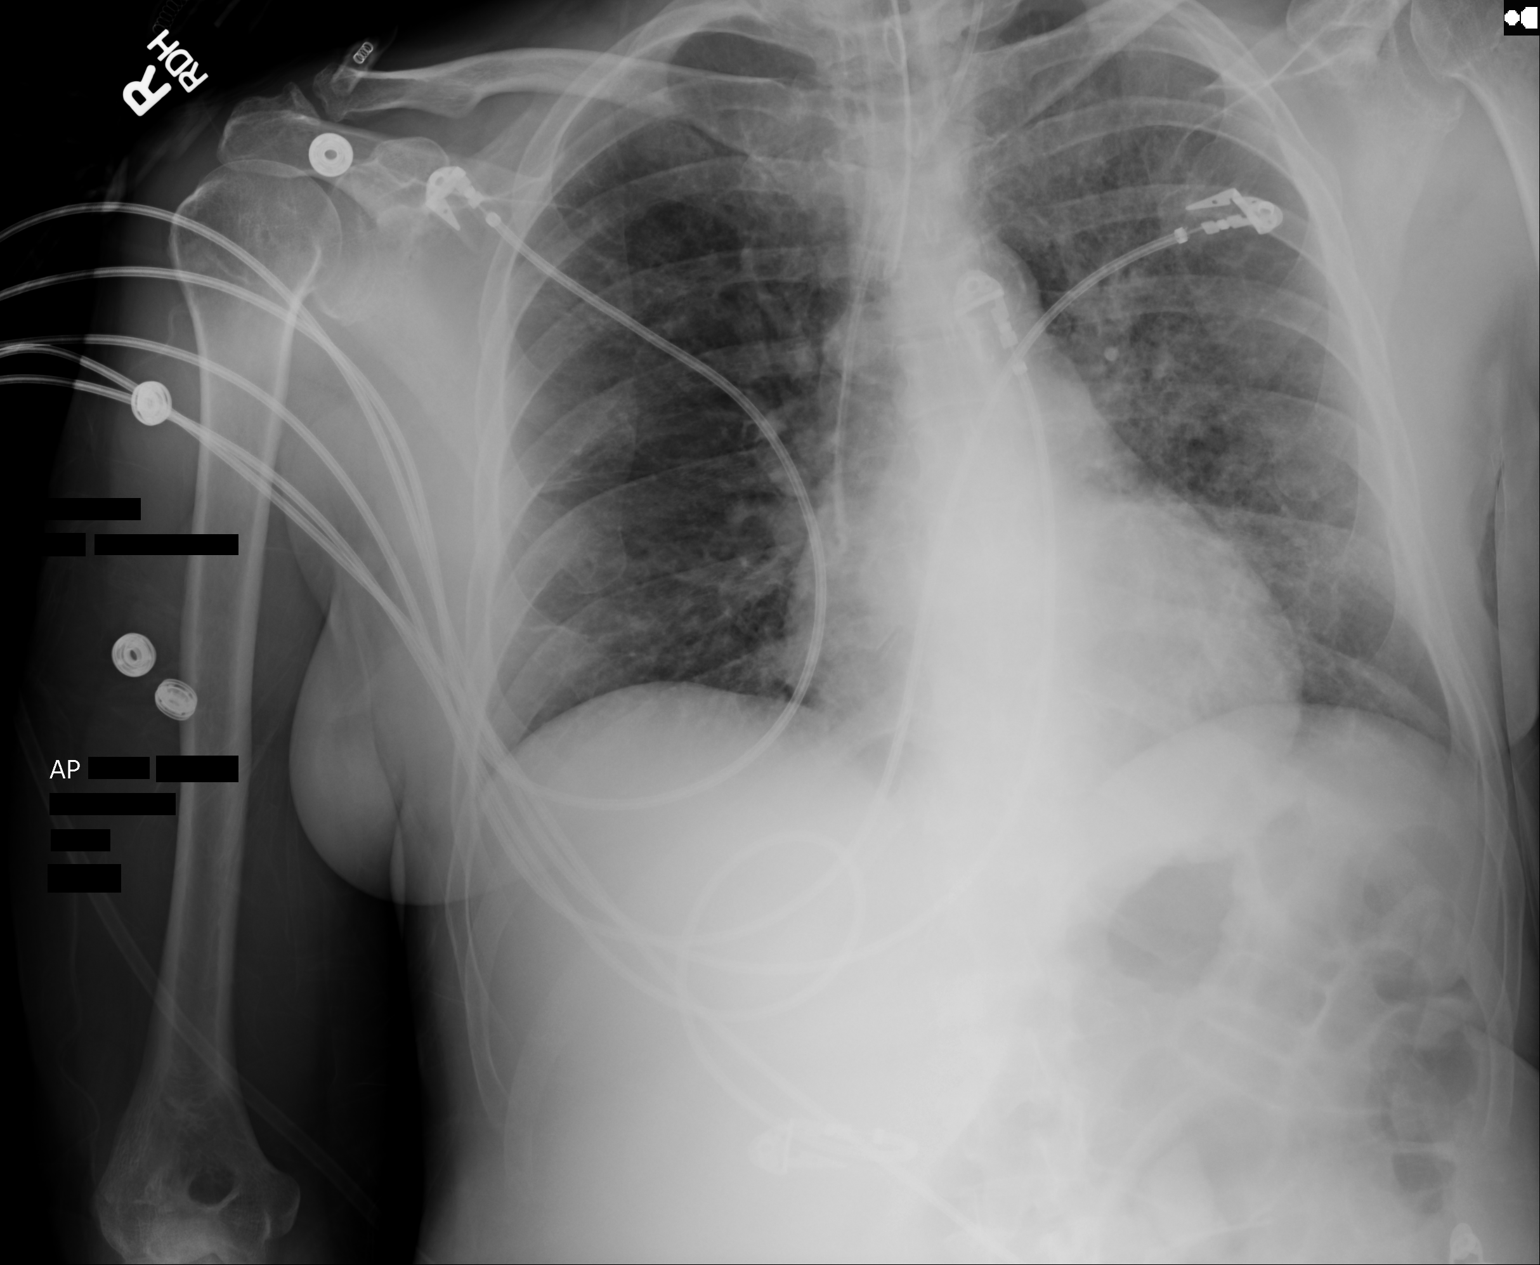

[1 of 1 positions shown; findings below may reference images not displayed]

PROCEDURE:     DXR - DXR PORTABLE CHEST SINGLE VIEW  - August 27, 2012  [DATE]

RESULT:     Endotracheal tube and central line good position. Borderline
cardiomegaly with mild interstitial prominence. Pulmonary vascular
prominence. Mild congestive heart failure cannot be excluded. Mild
pneumonitis cannot be excluded.
IMPRESSION: 1. Good positioning of endotracheal tube and central line.
2. Cannot exclude mild congestive heart failure with interstitial pulmonary
edema versus pneumonitis. These findings are new from 08/26/2012.

## 2013-05-21 IMAGING — CR DG CHEST 1V PORT
1 series · 1 of 1 positions shown · non-contrast
Comparison: none

REASON FOR EXAM: chf vs pneumonia follow up
COMMENTS:

PROCEDURE:     DXR - DXR PORTABLE CHEST SINGLE VIEW  - August 28, 2012  [DATE]
RESULT:     Chest radiograph dated 08/28/2012 comparison to prior study
dated 08/27/2012.

[ap]
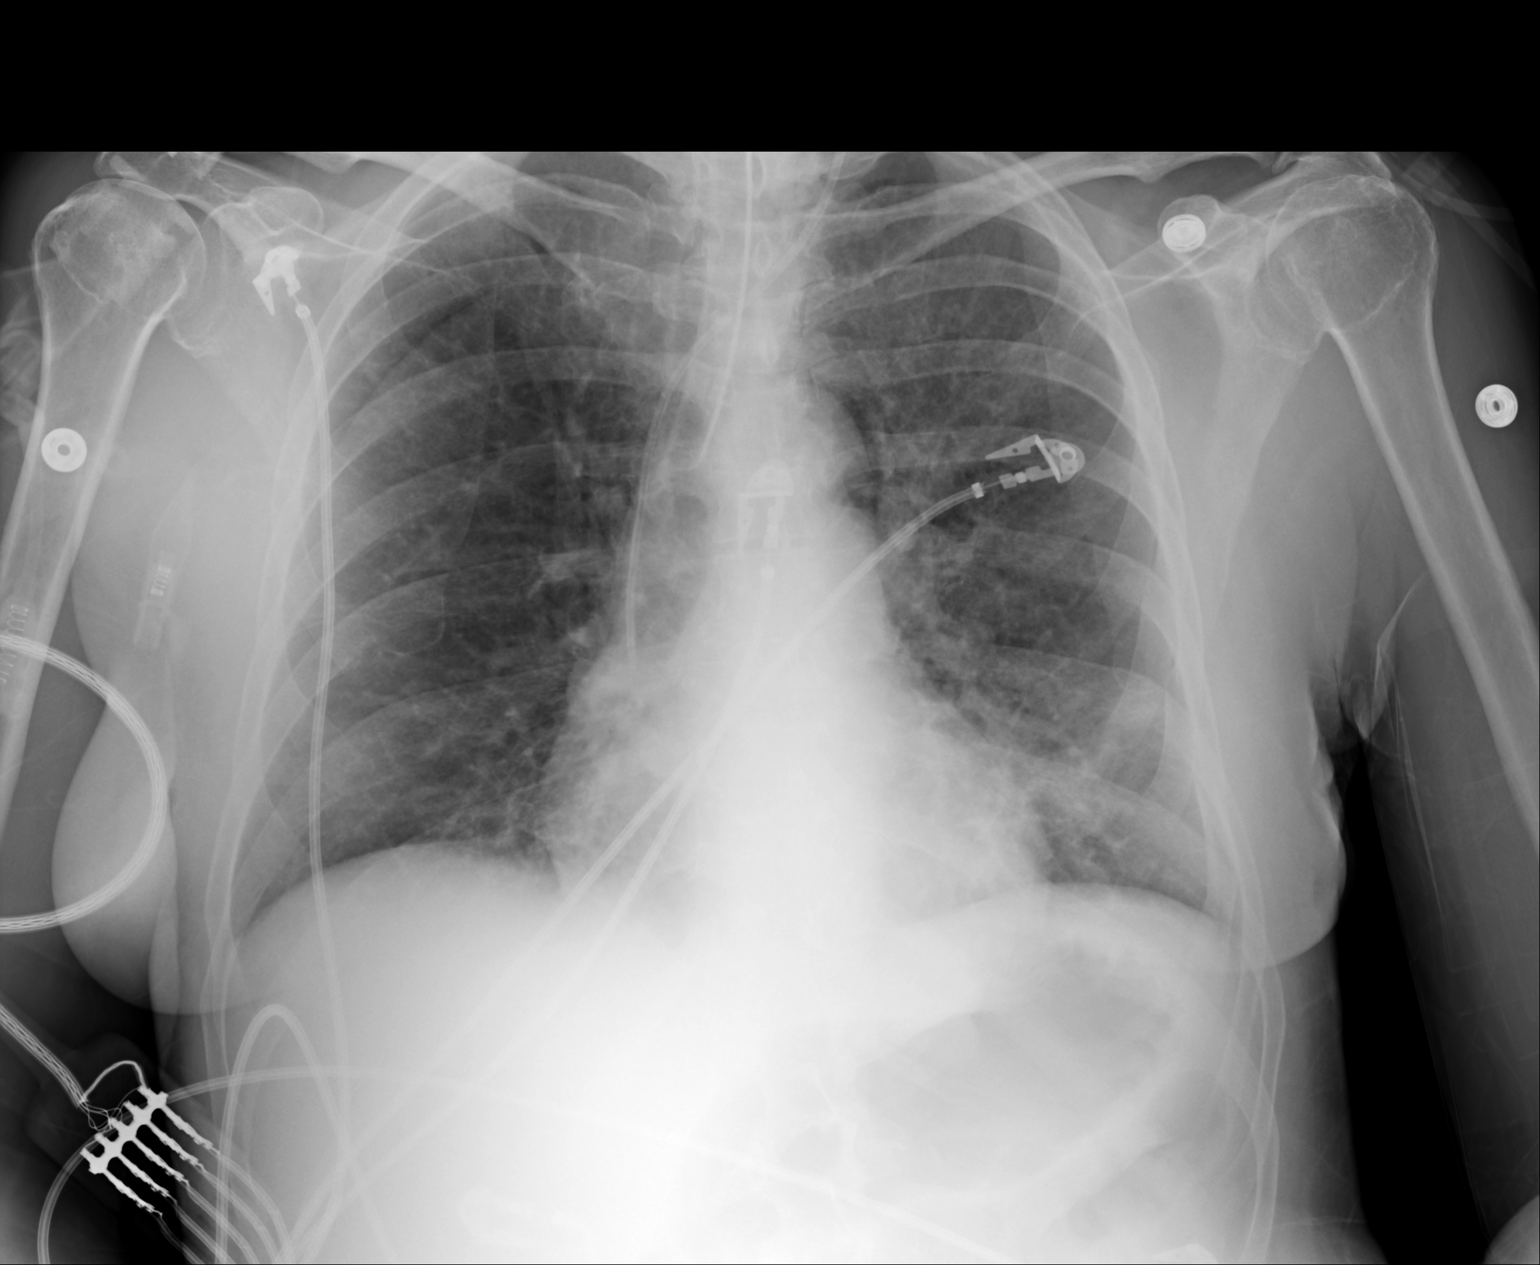

[1 of 1 positions shown; findings below may reference images not displayed]

FINDINGS: Endotracheal tube is identified with tip projecting within the
proximal aspect of the right mainstem bronchus. Retraction of approximately
2 to 3 cm is recommended. A left-sided central venous catheter is
appreciated with tip projecting in the region of the superior vena caval
right atrial junction. There is prominence of interstitial markings. No
focal regions of consolidation identified. Cardiac silhouette and visualized
bony skeleton are unremarkable.
IMPRESSION: Retraction of the patient's endotracheal tube as described
above.
2. Otherwise no further significant change in the chest radiograph.

## 2013-05-21 IMAGING — CR DG CHEST 1V PORT
1 series · 1 of 1 positions shown · non-contrast
Comparison: none

REASON FOR EXAM: resp failure, on vent
COMMENTS:

PROCEDURE:     DXR - DXR PORTABLE CHEST SINGLE VIEW  - August 28, 2012  [DATE]
RESULT:     Comparison is made to a prior study dated 08/27/2012.

[ap]
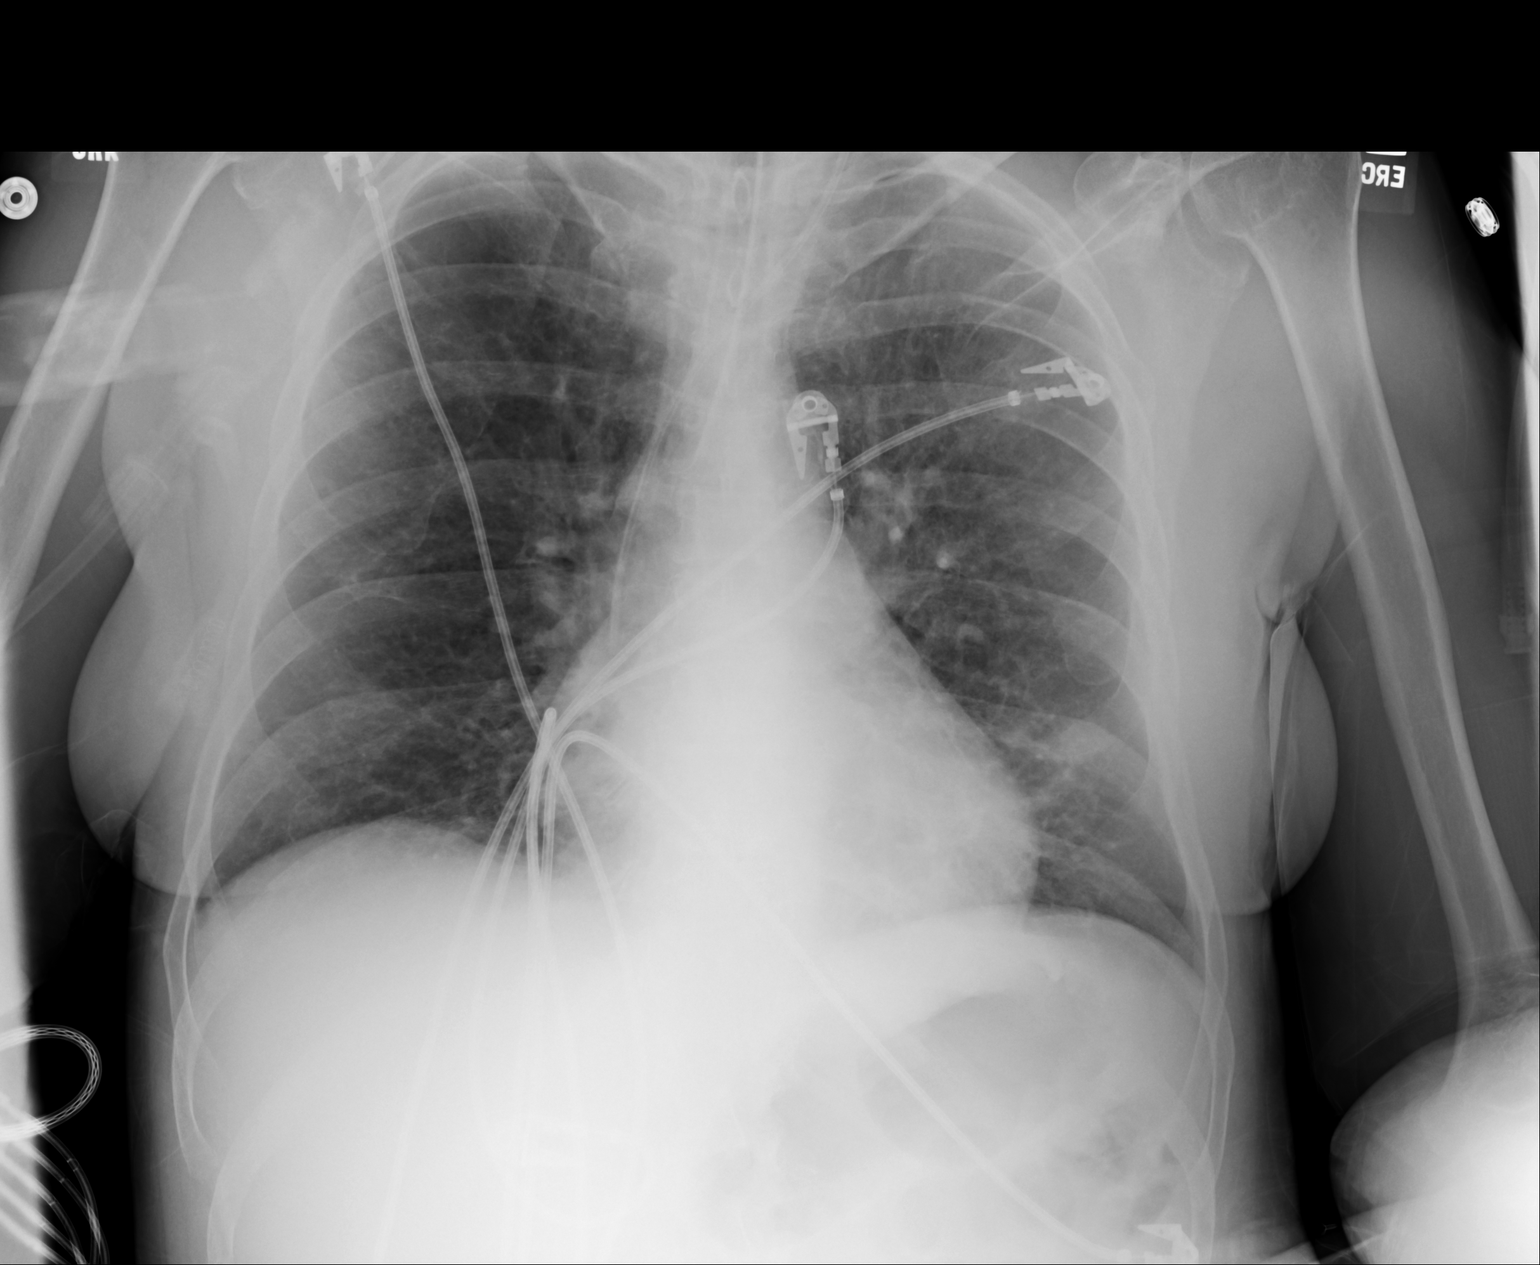

[1 of 1 positions shown; findings below may reference images not displayed]

FINDINGS: The patient's endotracheal tube is directed towards the right
mainstem bronchus with tip projecting in the region of the proximal portion
of the bronchus. A left-sided central venous catheter is appreciated with
tip at the level of the superior vena caval right atrial junction. There is
no evidence of focal infiltrates, effusions or edema. The cardiac silhouette
and visualized bony skeleton are unremarkable.
IMPRESSION: 1. Endotracheal tube as described above. Retraction of 1.5 to 2 cm is
recommended.

## 2013-05-21 IMAGING — US ABDOMEN ULTRASOUND
1 series · 14 of 25 positions shown · non-contrast
Comparison: none

REASON FOR EXAM: elevated transminases
COMMENTS:

PROCEDURE:     US  - US ABDOMEN GENERAL SURVEY  - August 28, 2012 [DATE]
RESULT:     Abdominal ultrasound dated 08/20/2012.

[Series 1: abdomen ultrasound · 0.31mm/px · 14 of 53 slices shown]
[im 1/53]
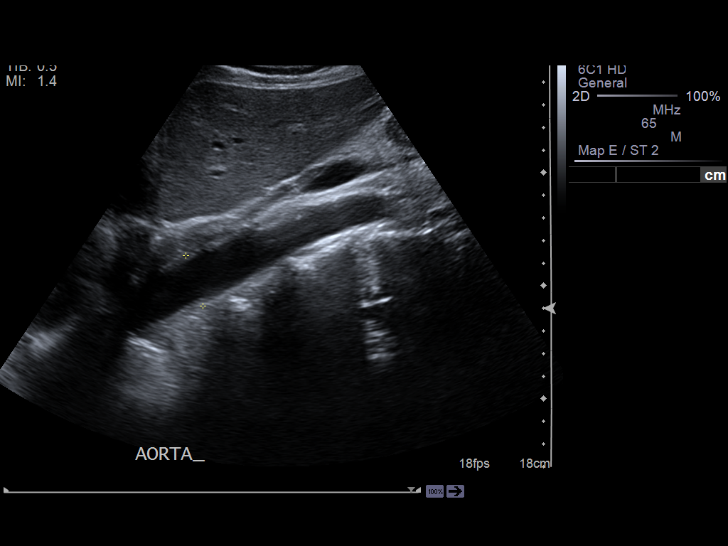
[im 5/53]
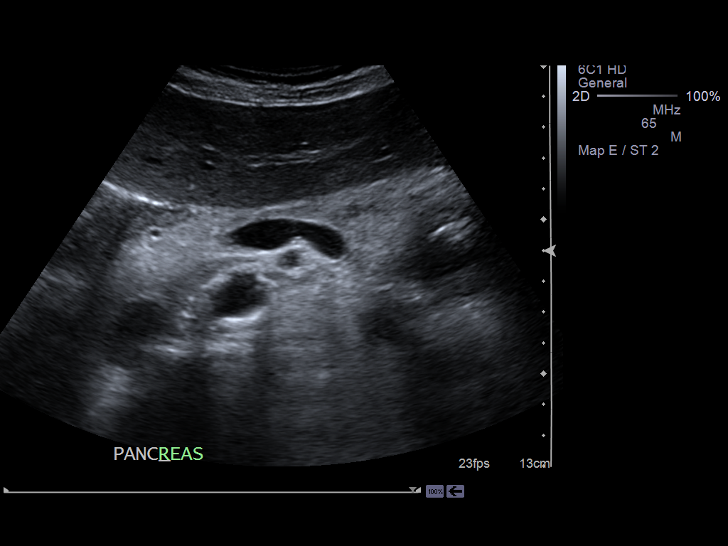
[im 9/53]
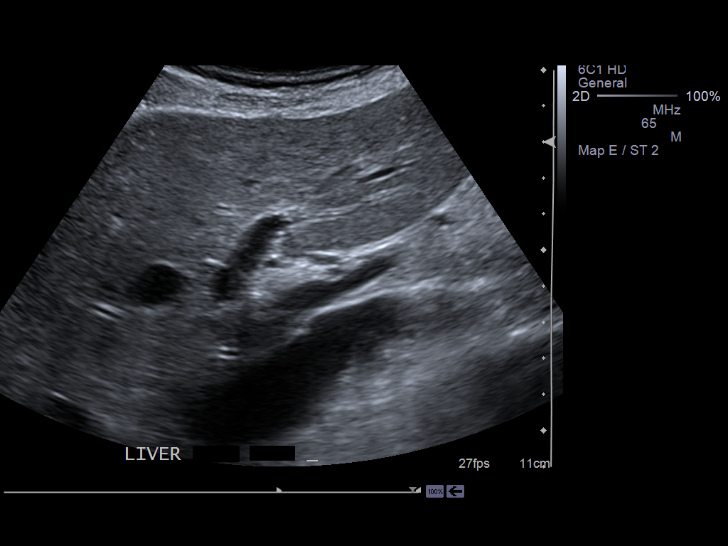
[im 14/53]
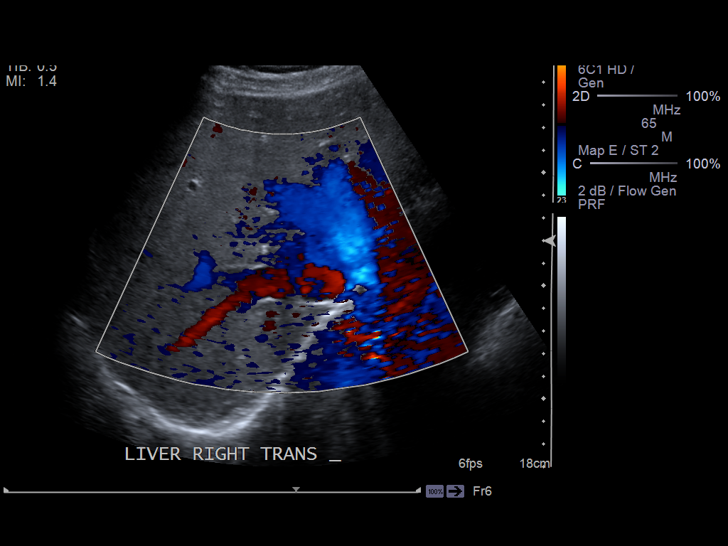
[im 18/53]
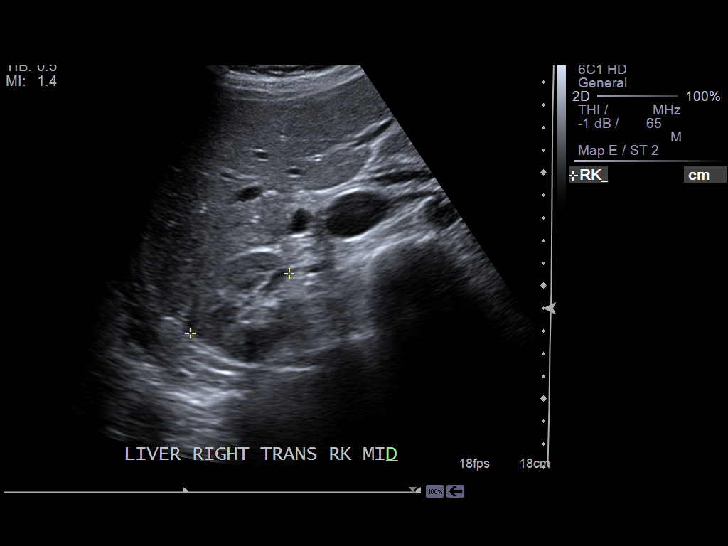
[im 20/53]
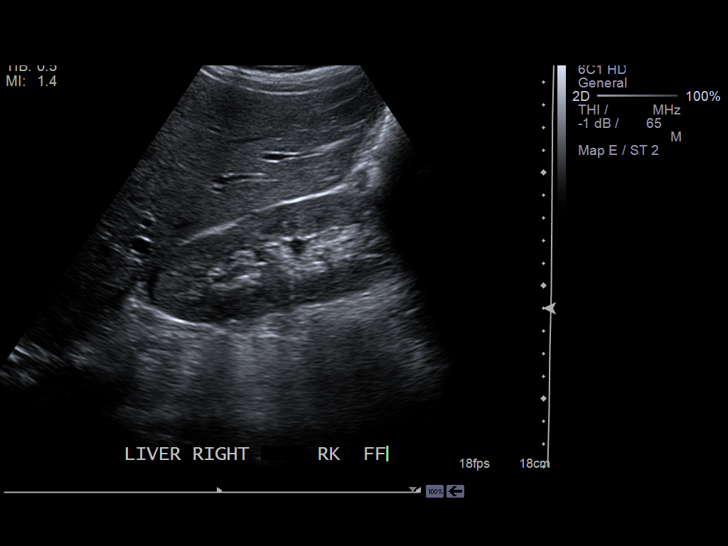
[im 24/53]
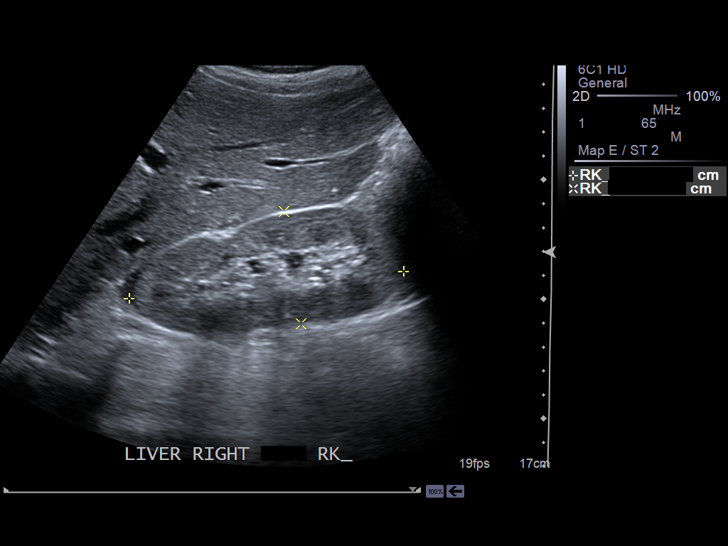
[im 29/53]
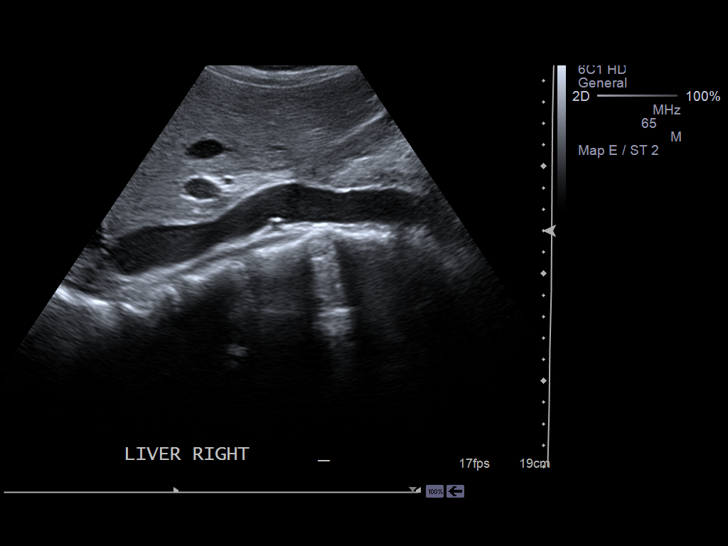
[im 33/53]
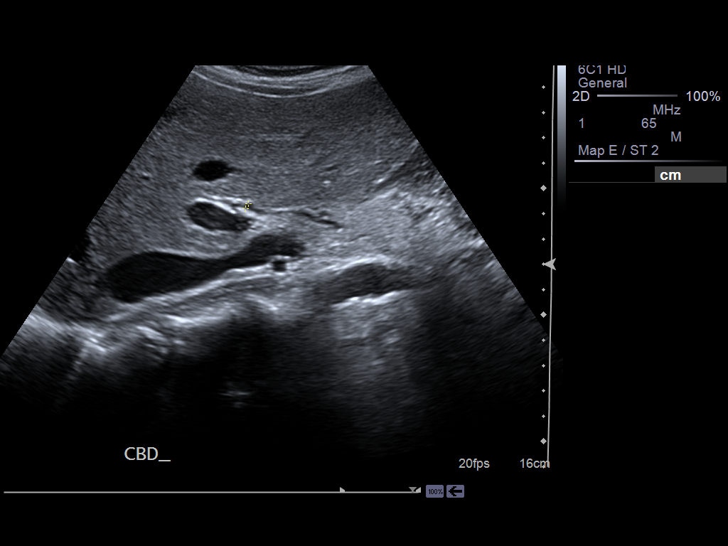
[im 35/53]
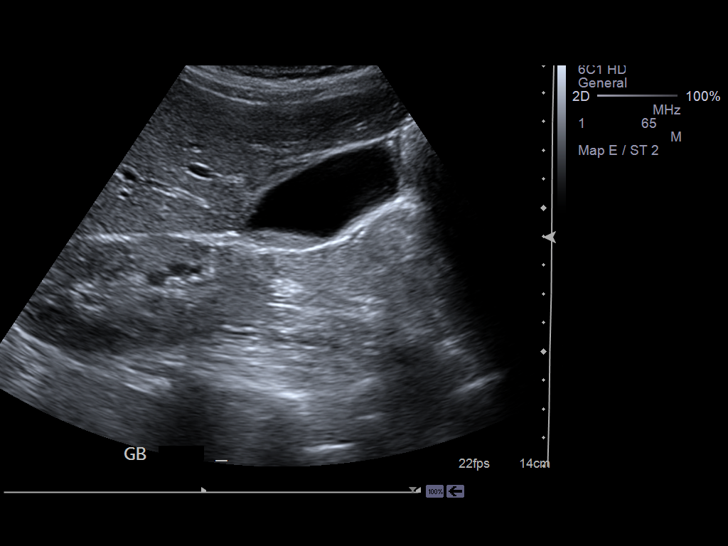
[im 40/53]
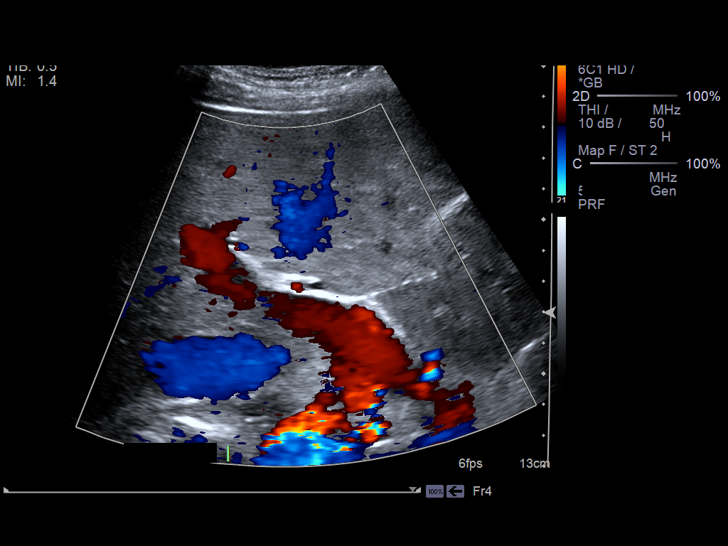
[im 44/53]
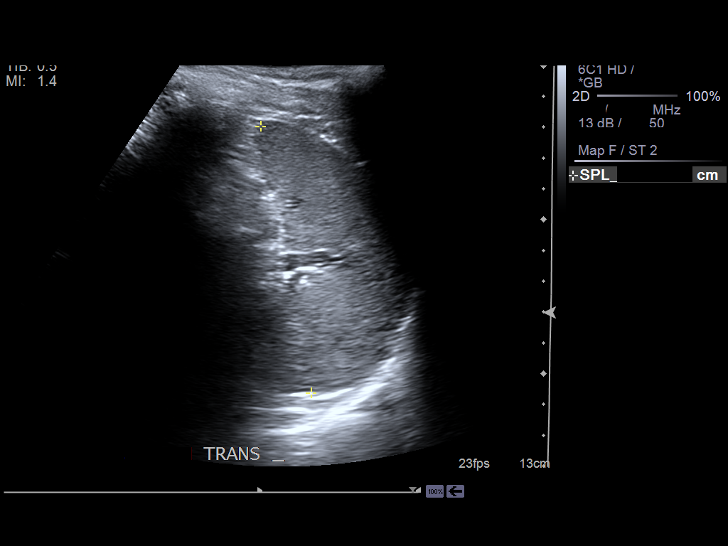
[im 48/53]
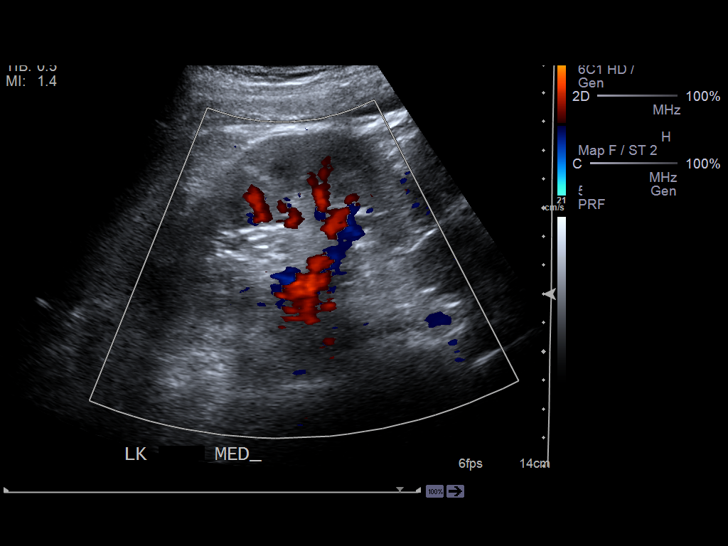
[im 53/53]
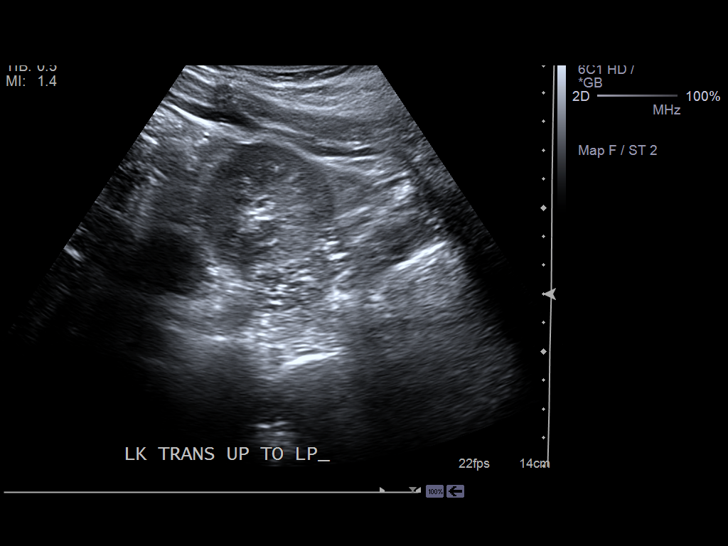

[14 of 25 positions shown; findings below may reference images not displayed]

FINDINGS: The liver is enlarged measuring 18.65 cm measured at the
midclavicular line. The liver is echogenic. Hepato- pedal flow identified
within the portal vein. There is no evidence of abdominal aortic aneurysm.
Visualized portions of the pancreas unremarkable. The spleen is homogeneous
echotexture and measures 9.08 cm in longitudinal dimensions. There is no
evidence of pericholecystic fluid nor gallstones. There appears to be sludge
within the dependent portion of the gallbladder. Gallbladder wall thickness
is 2.4 mm and the common bile that measures 2.3 mm in diameter. The right
kidney measures 11.5, 4.76 x 5.11 cm cm and the left 10.74 x 5.1 x 5.12. A
small cyst identified within the upper pole the right kidney measuring
x 1.23 x 1.33 cm otherwise appropriate corticomedullary differentiation no
evidence of hydronephrosis.
IMPRESSION: Hepatic steatosis.
2. Sludge within the gallbladder

## 2013-05-22 IMAGING — CR DG CHEST 1V PORT
1 series · 1 of 1 positions shown · non-contrast
Comparison: none

REASON FOR EXAM: vent
COMMENTS:

PROCEDURE:     DXR - DXR PORTABLE CHEST SINGLE VIEW  - August 29, 2012  [DATE]
RESULT:     Comparison: 08/28/2012

[ap]
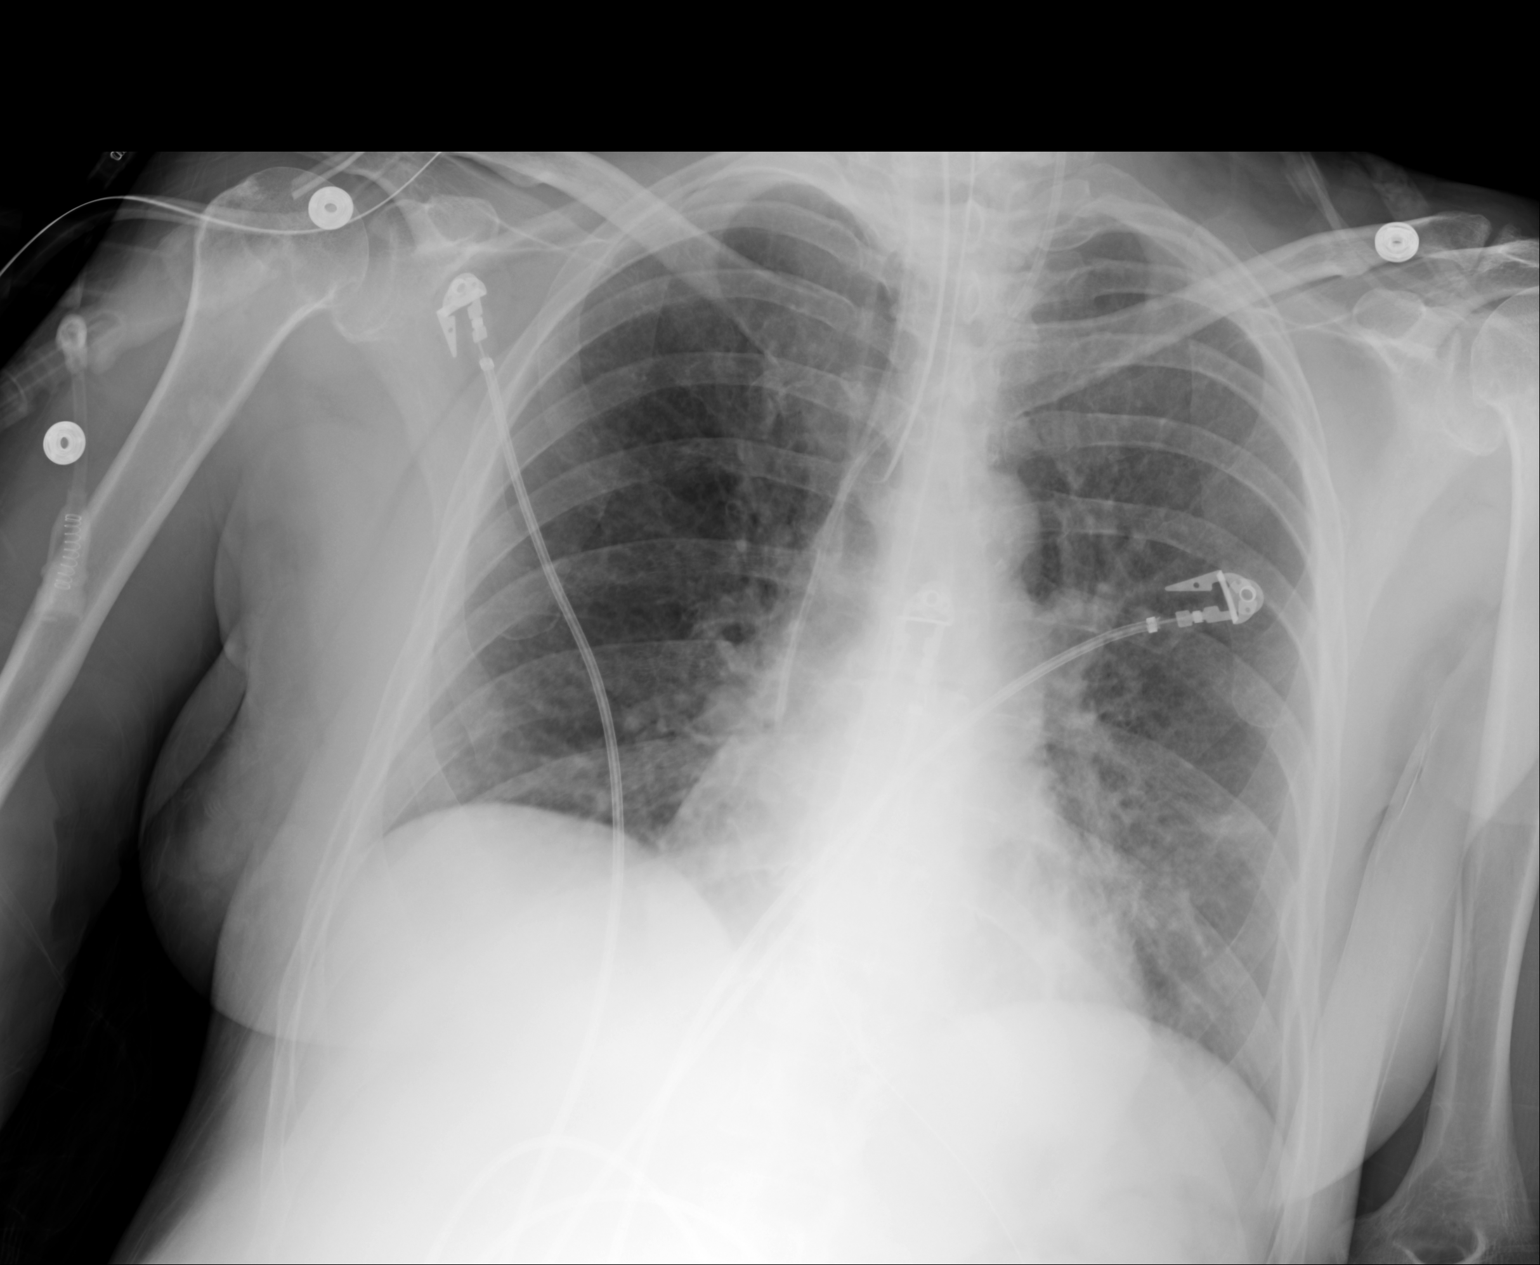

[1 of 1 positions shown; findings below may reference images not displayed]

FINDINGS: Enteric tube courses subdiaphragmatically, beyond the field-of-view.
Remainder of the support apparatus is stable. The heart and mediastinum are
stable. There are mild bilateral interstitial opacities which appear new
from prior. These are asymmetric to the left.
IMPRESSION: Findings likely secondary to asymmetric pulmonary edema. Infection is not
excluded.

[REDACTED]

## 2013-06-09 IMAGING — CR DG CHEST 2V
1 series · 2 of 2 positions shown · non-contrast
Comparison: none

REASON FOR EXAM: SOB
COMMENTS:

PROCEDURE:     DXR - DXR CHEST PA (OR AP) AND LATERAL  - September 16, 2012 [DATE]
RESULT:     Comparison: 08/29/2012

[Series 1: w chest pa · 0.14mm/px · 2 of 2 slices shown]
[im 1/2]
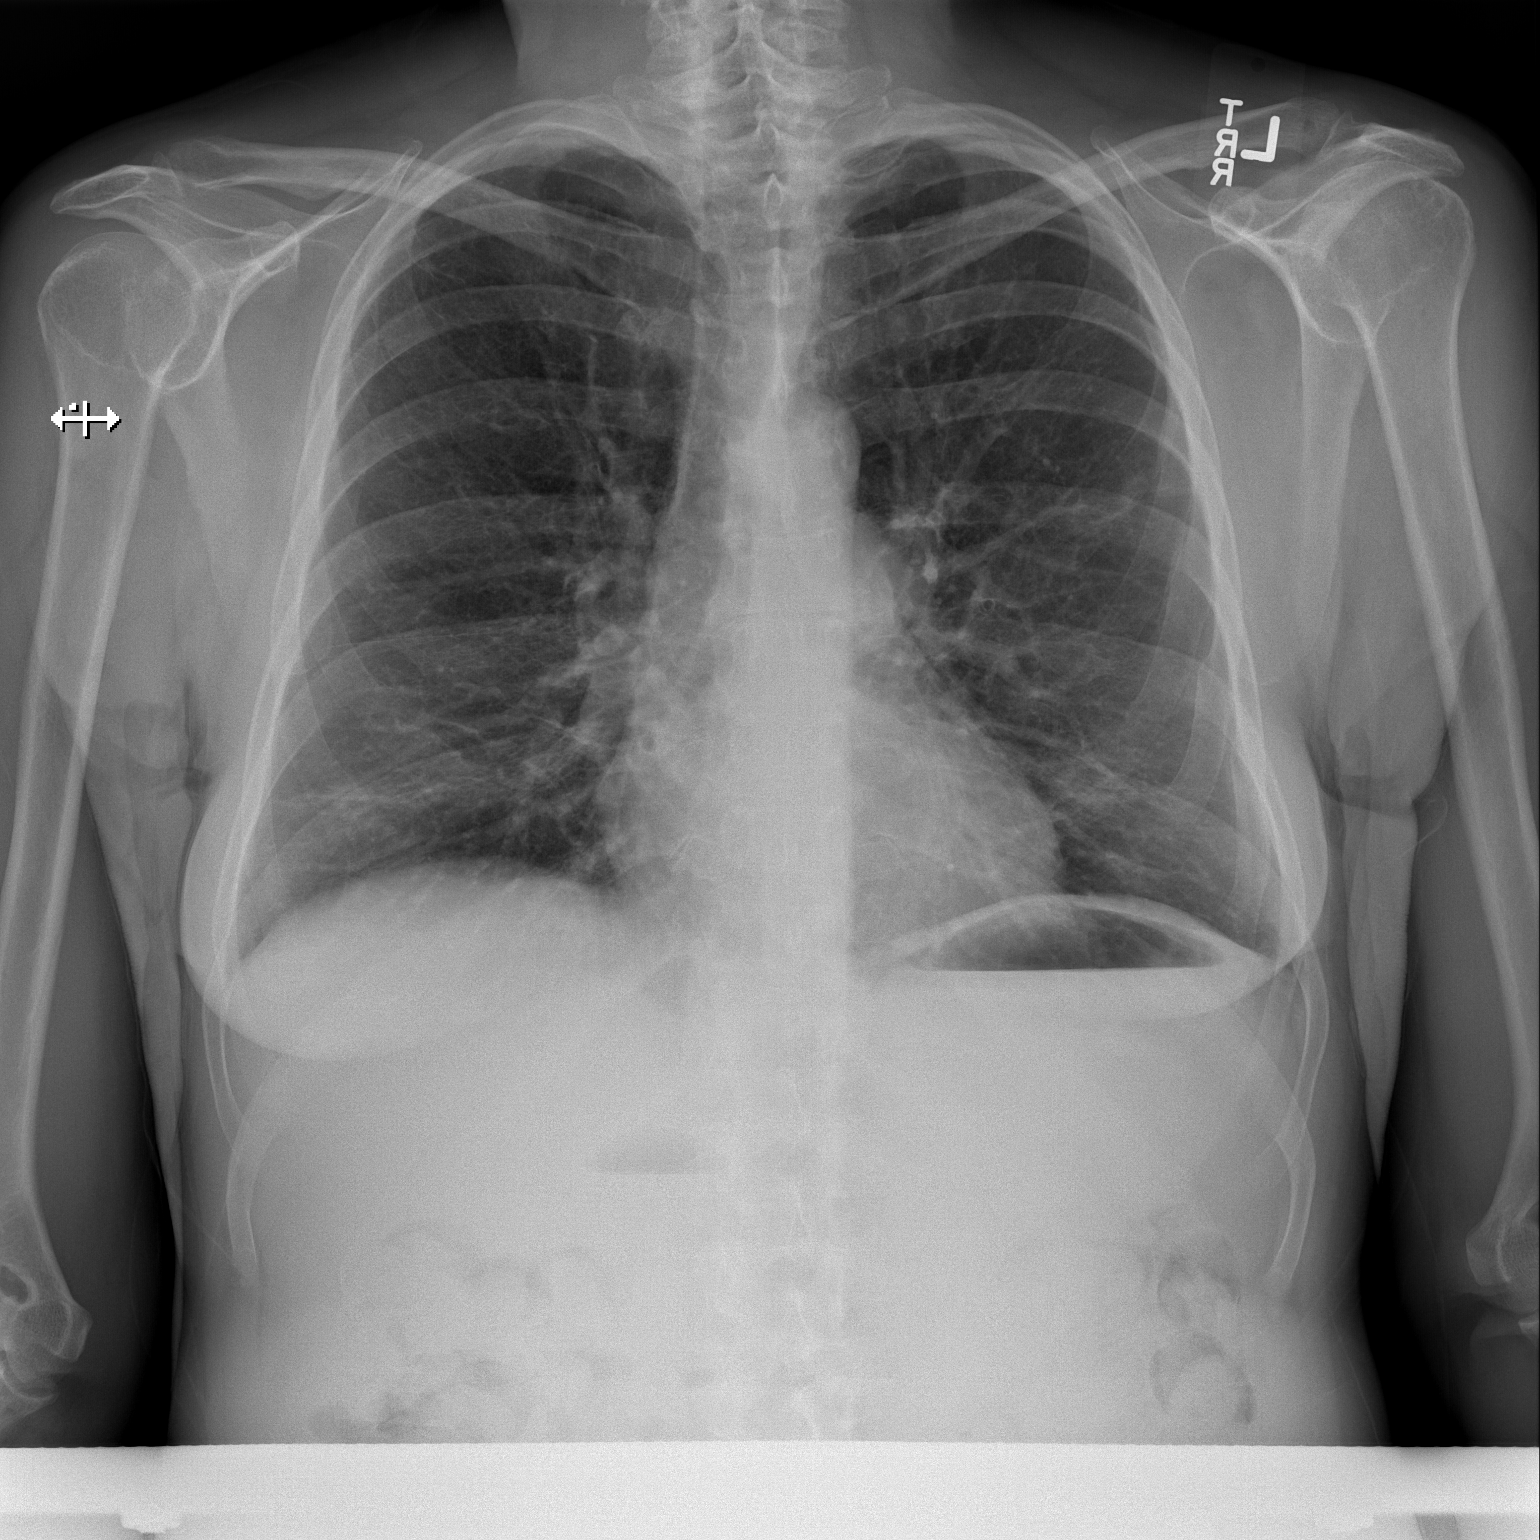
[im 2/2]
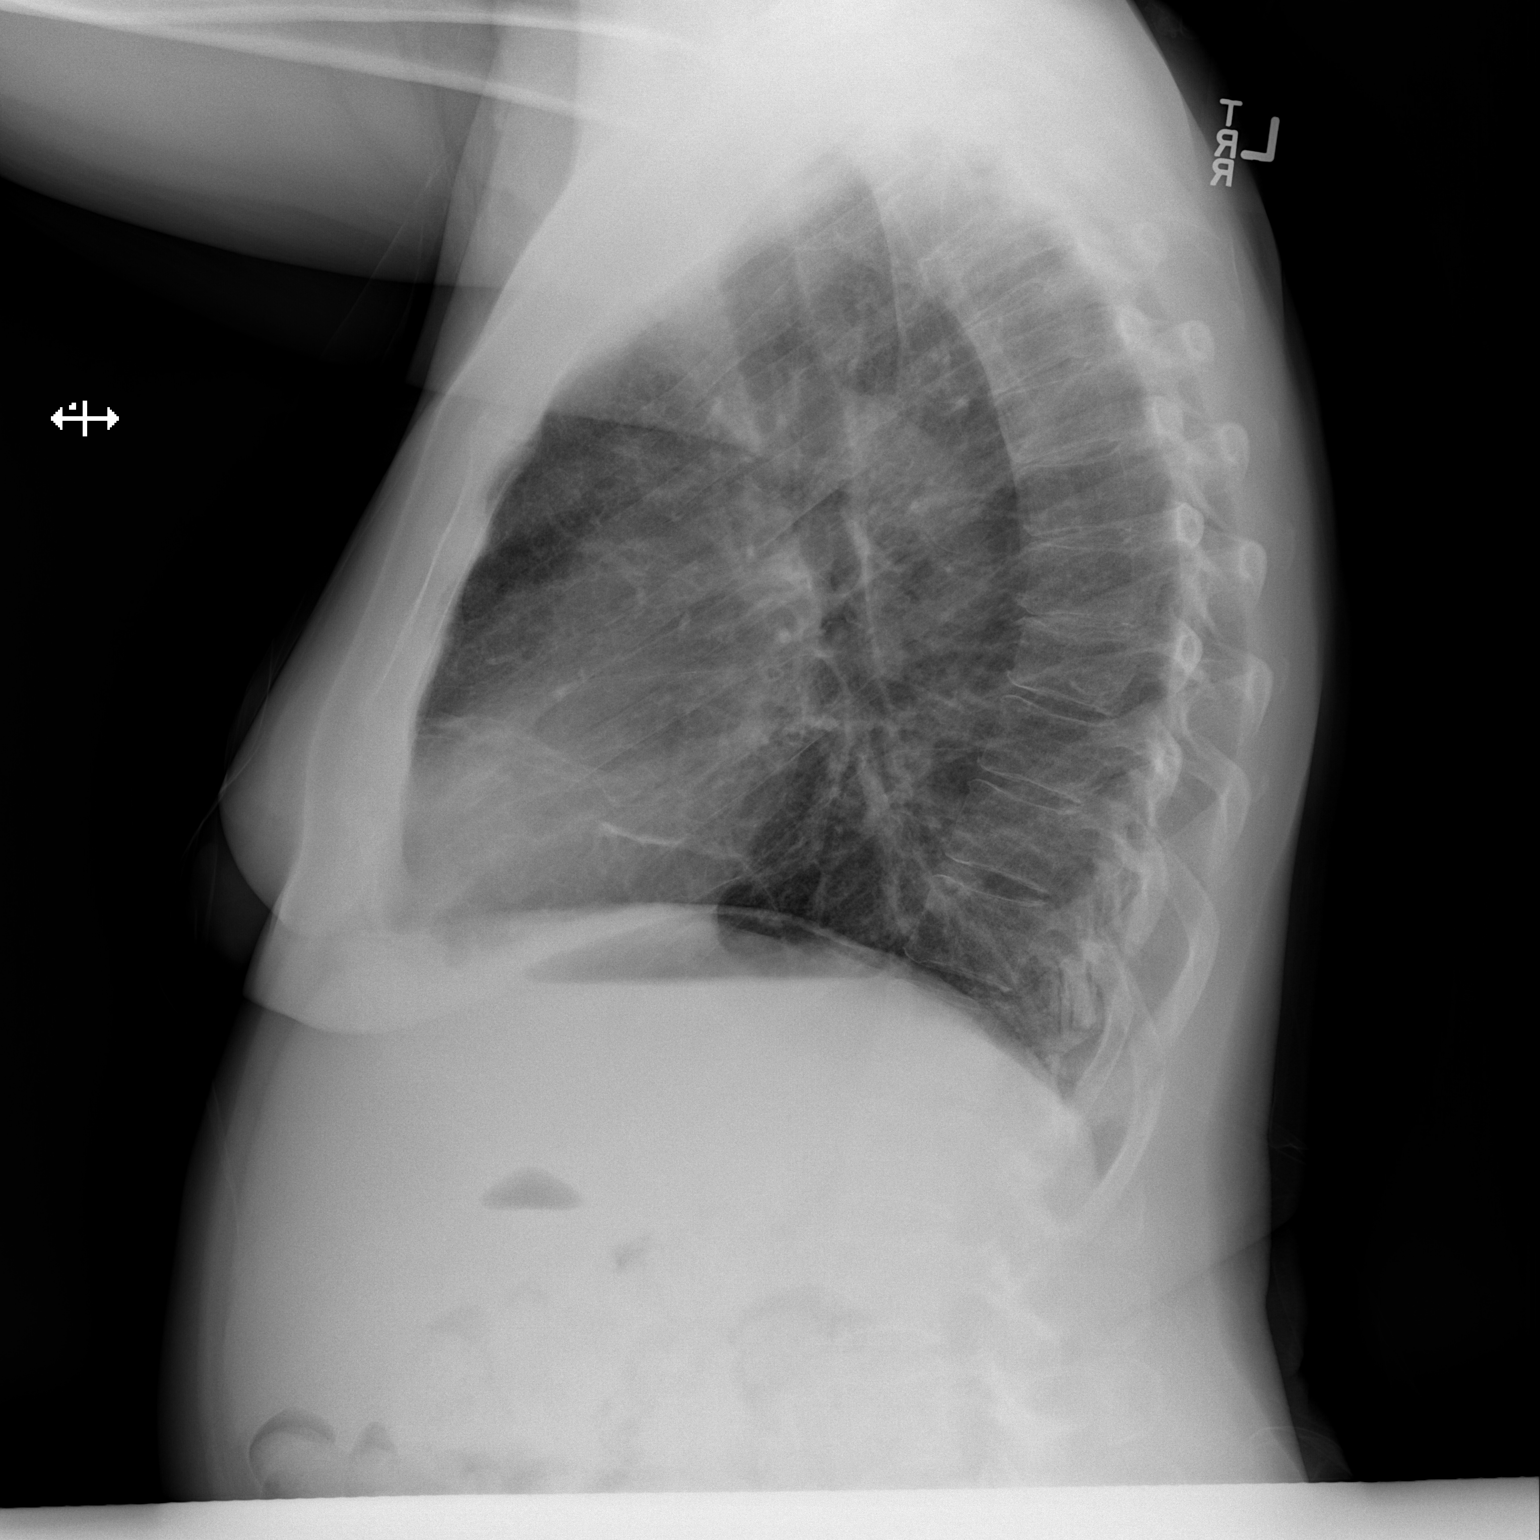

[2 of 2 positions shown; findings below may reference images not displayed]

FINDINGS: Endotracheal tube and central venous catheter have been removed. The heart
and mediastinum are stable. The lung volumes are slightly diminished. There
are mild reticular opacities in the mid left lung and at the lung bases.
IMPRESSION: Findings which likely represents subsegmental atelectasis in the mid left
lung and at the lung bases.

## 2013-06-15 IMAGING — CR DG CHEST 2V
1 series · 2 of 2 positions shown · non-contrast
Comparison: none

REASON FOR EXAM: cough
COMMENTS:

[Series 3: w chest pa · 0.14mm/px · 2 of 2 slices shown]
[im 1/2]
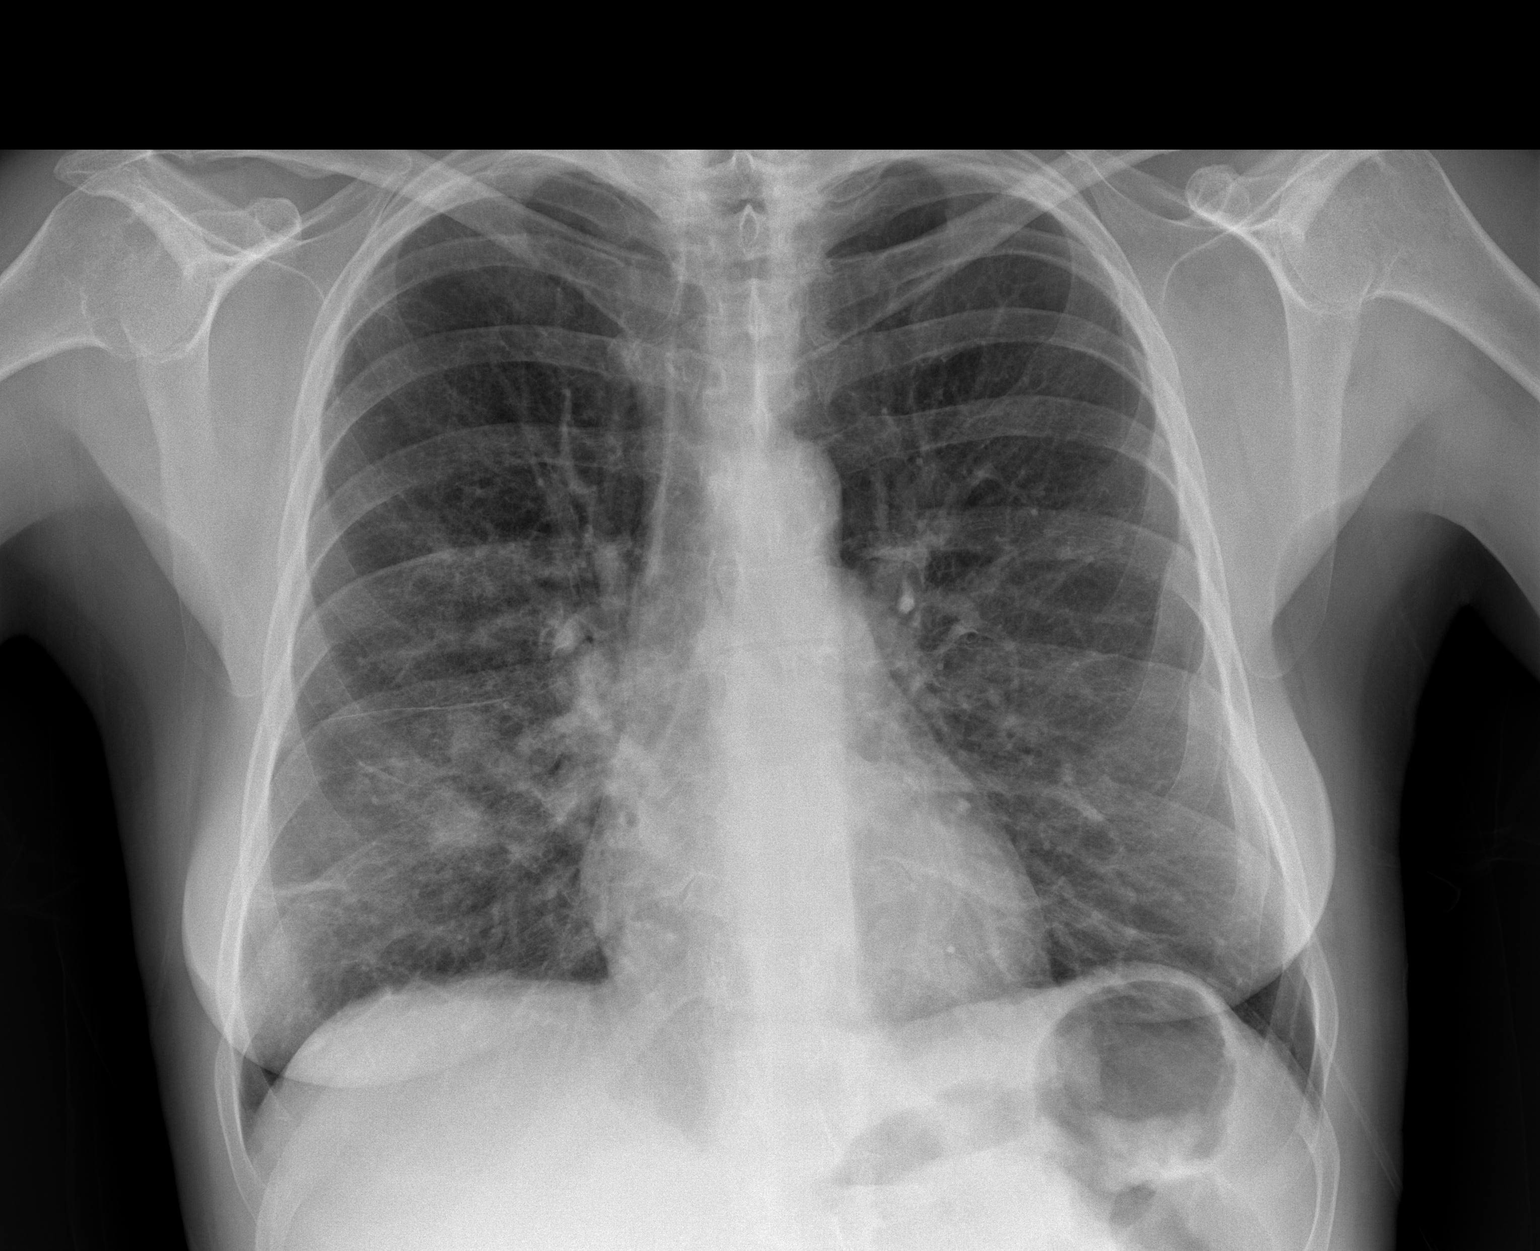
[im 2/2]
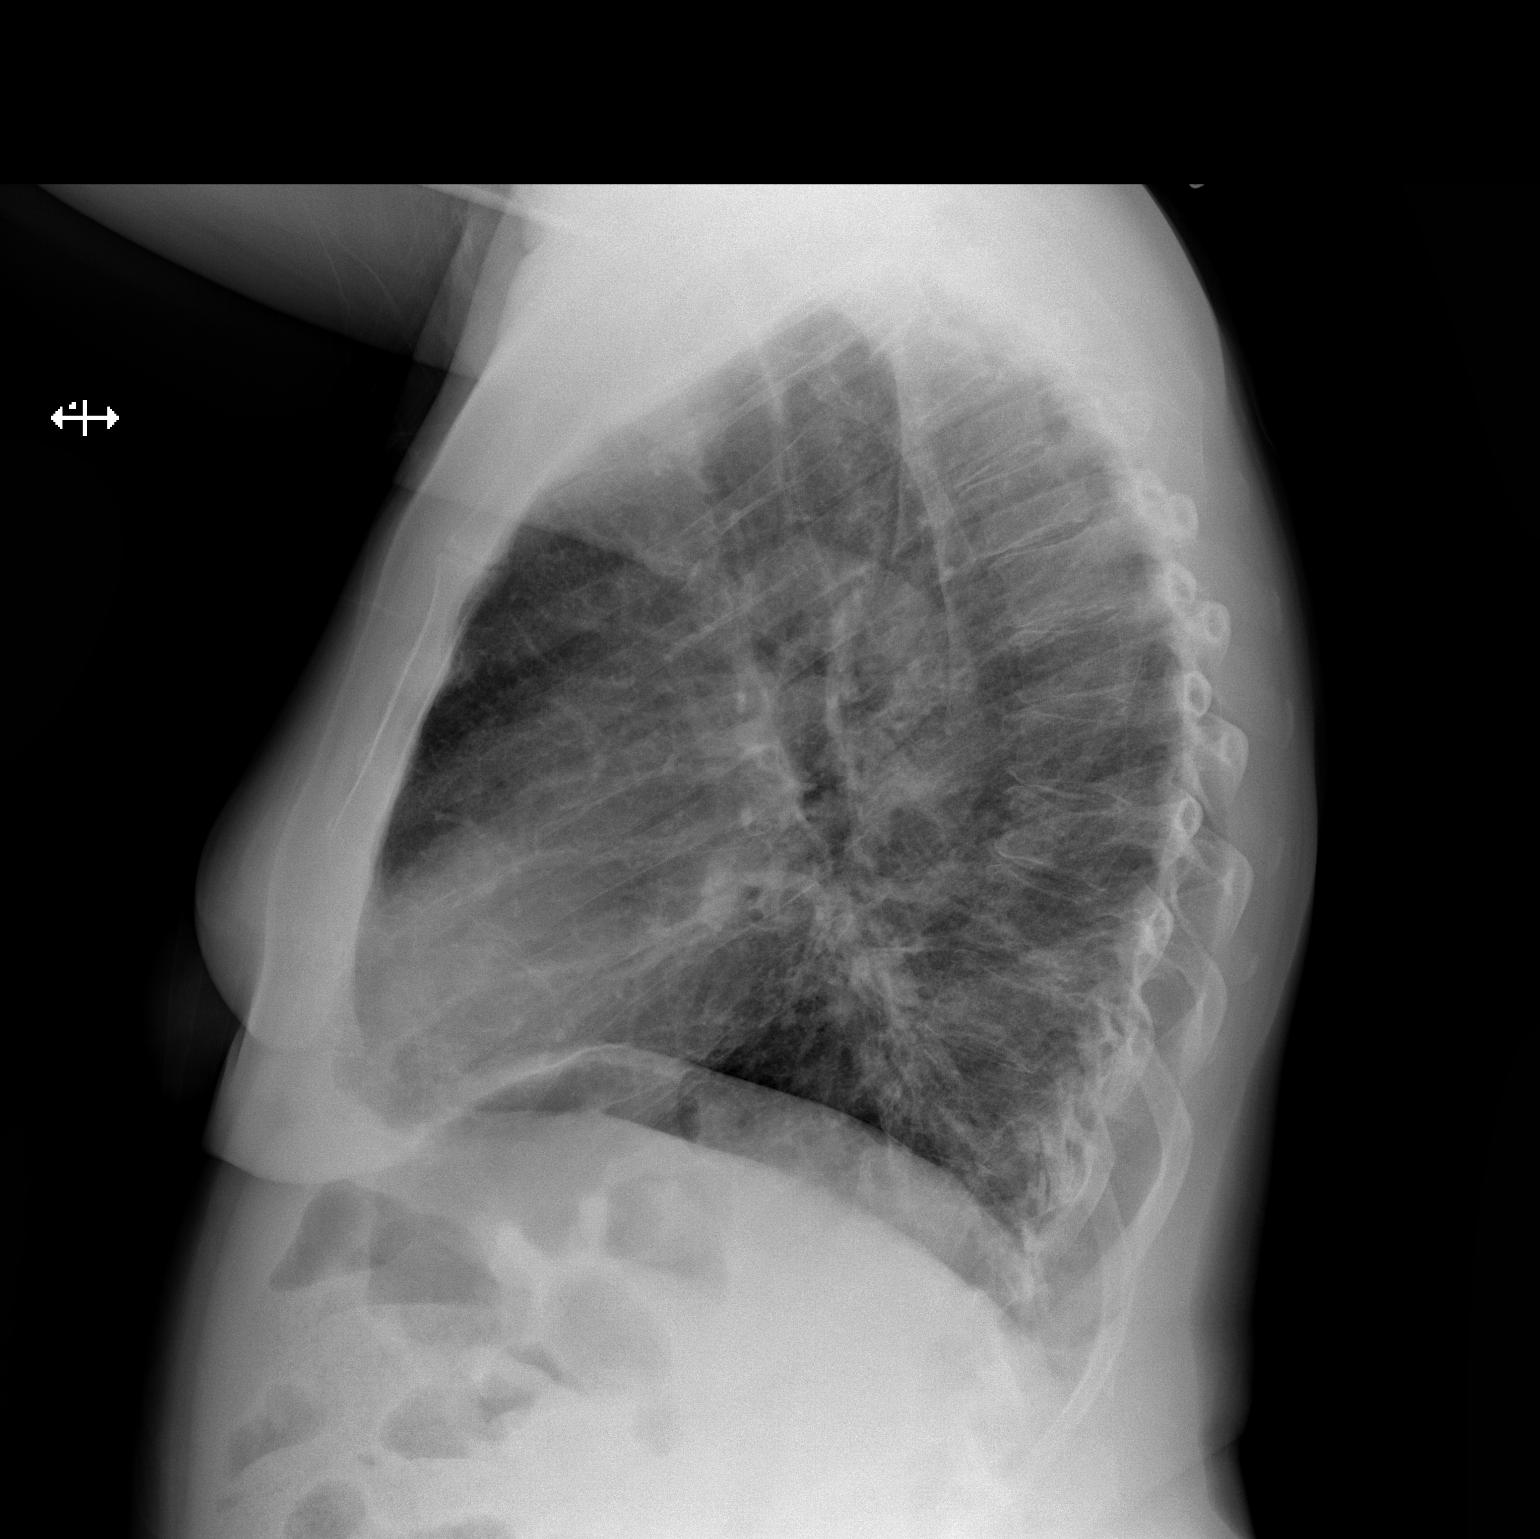

[2 of 2 positions shown; findings below may reference images not displayed]

PROCEDURE:     DXR - DXR CHEST PA (OR AP) AND LATERAL  - September 22, 2012  [DATE]

RESULT:     Comparison is made to the previous study 16 September, 2012.

There is increasing density in the right lung base concerning for
predominately right lower lobe and right middle lobe infiltrate superimposed
on some underlying emphysematous changes. Old healed lower left rib
fractures appear present area heart size is normal. There is no effusion.
IMPRESSION: Chronic emphysematous changes. Developing right-sided
pneumonia at the lung base predominately in the right lower lobe and right
middle lobe. Followup to document complete resolution is recommended.

[REDACTED](*)

## 2013-06-20 ENCOUNTER — Ambulatory Visit: Payer: Self-pay | Admitting: Podiatry

## 2013-06-28 ENCOUNTER — Emergency Department: Payer: Self-pay | Admitting: Emergency Medicine

## 2013-07-04 IMAGING — CR DG CHEST 2V
1 series · 2 of 2 positions shown · non-contrast
Comparison: none

REASON FOR EXAM: SOB
COMMENTS:

PROCEDURE:     DXR - DXR CHEST PA (OR AP) AND LATERAL  - October 11, 2012  [DATE]
RESULT:     Comparison made to prior study 09/22/2012. This been near
complete clearing of the infiltrate right lung base. Heart size normal.

[Series 5: x chest ap · 0.14mm/px · 2 of 2 slices shown]
[im 1/2]
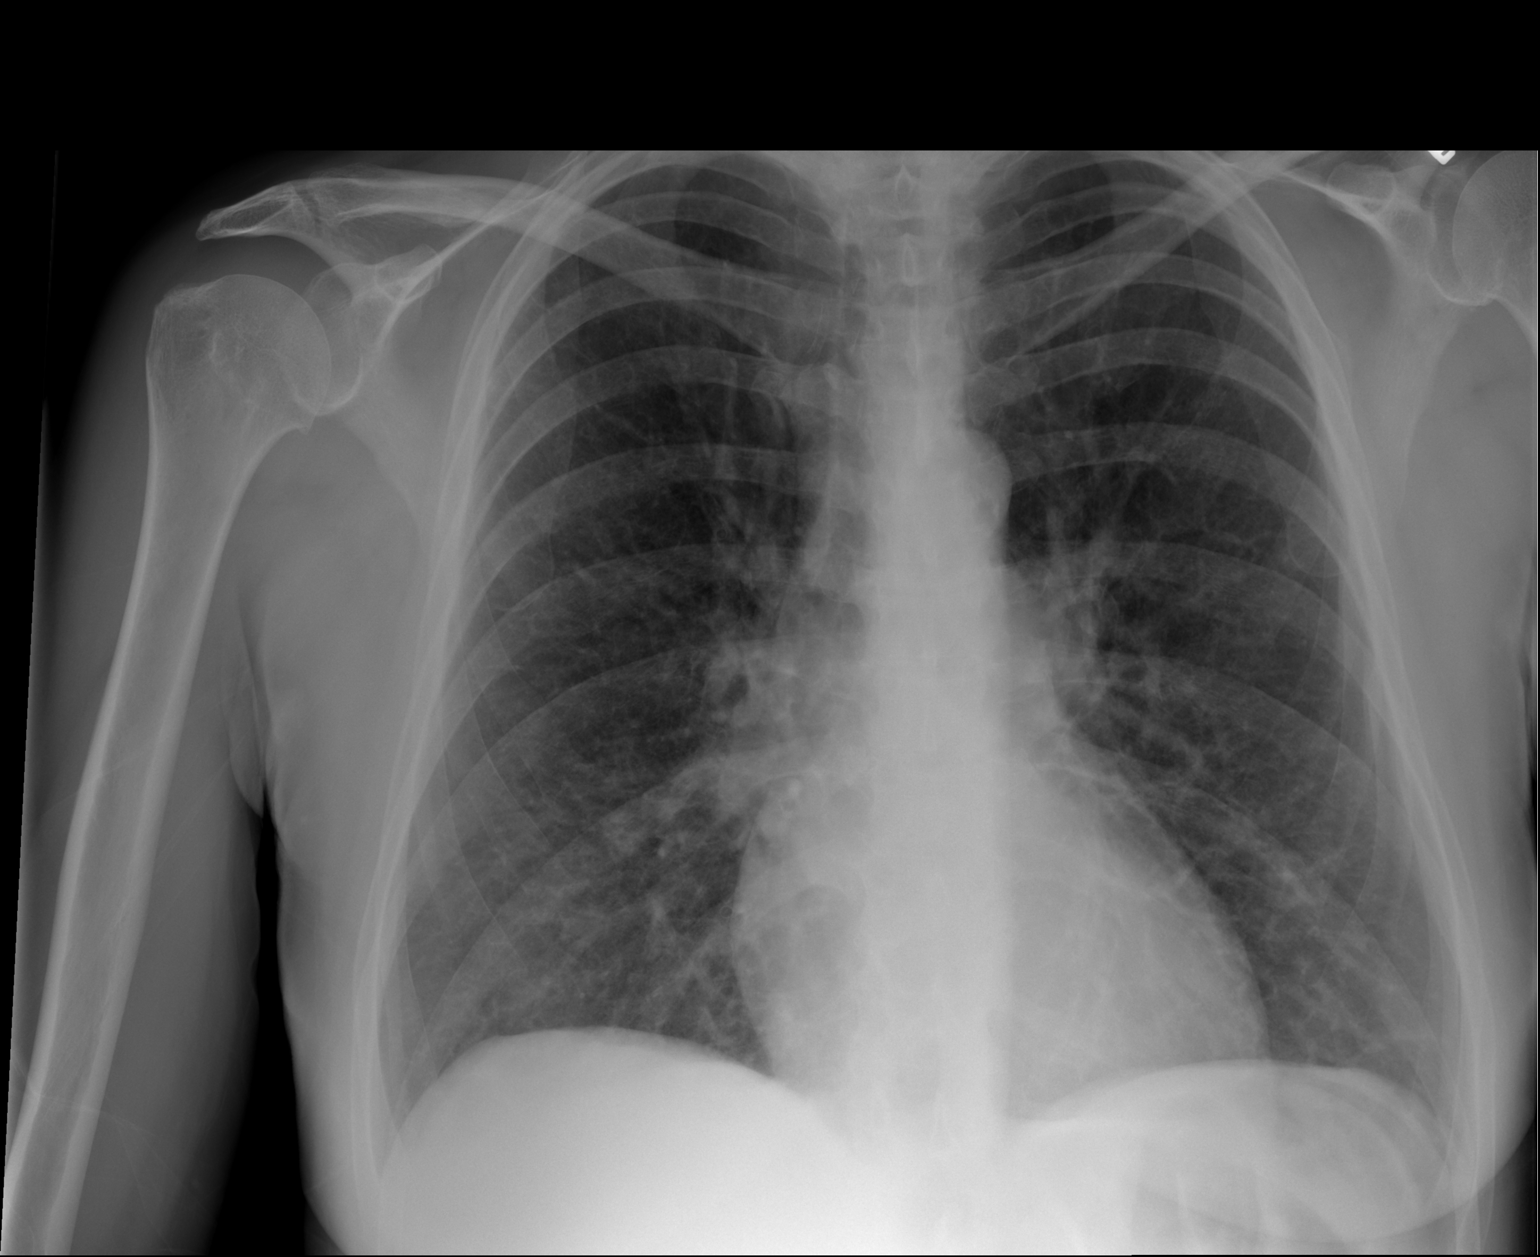
[im 2/2]
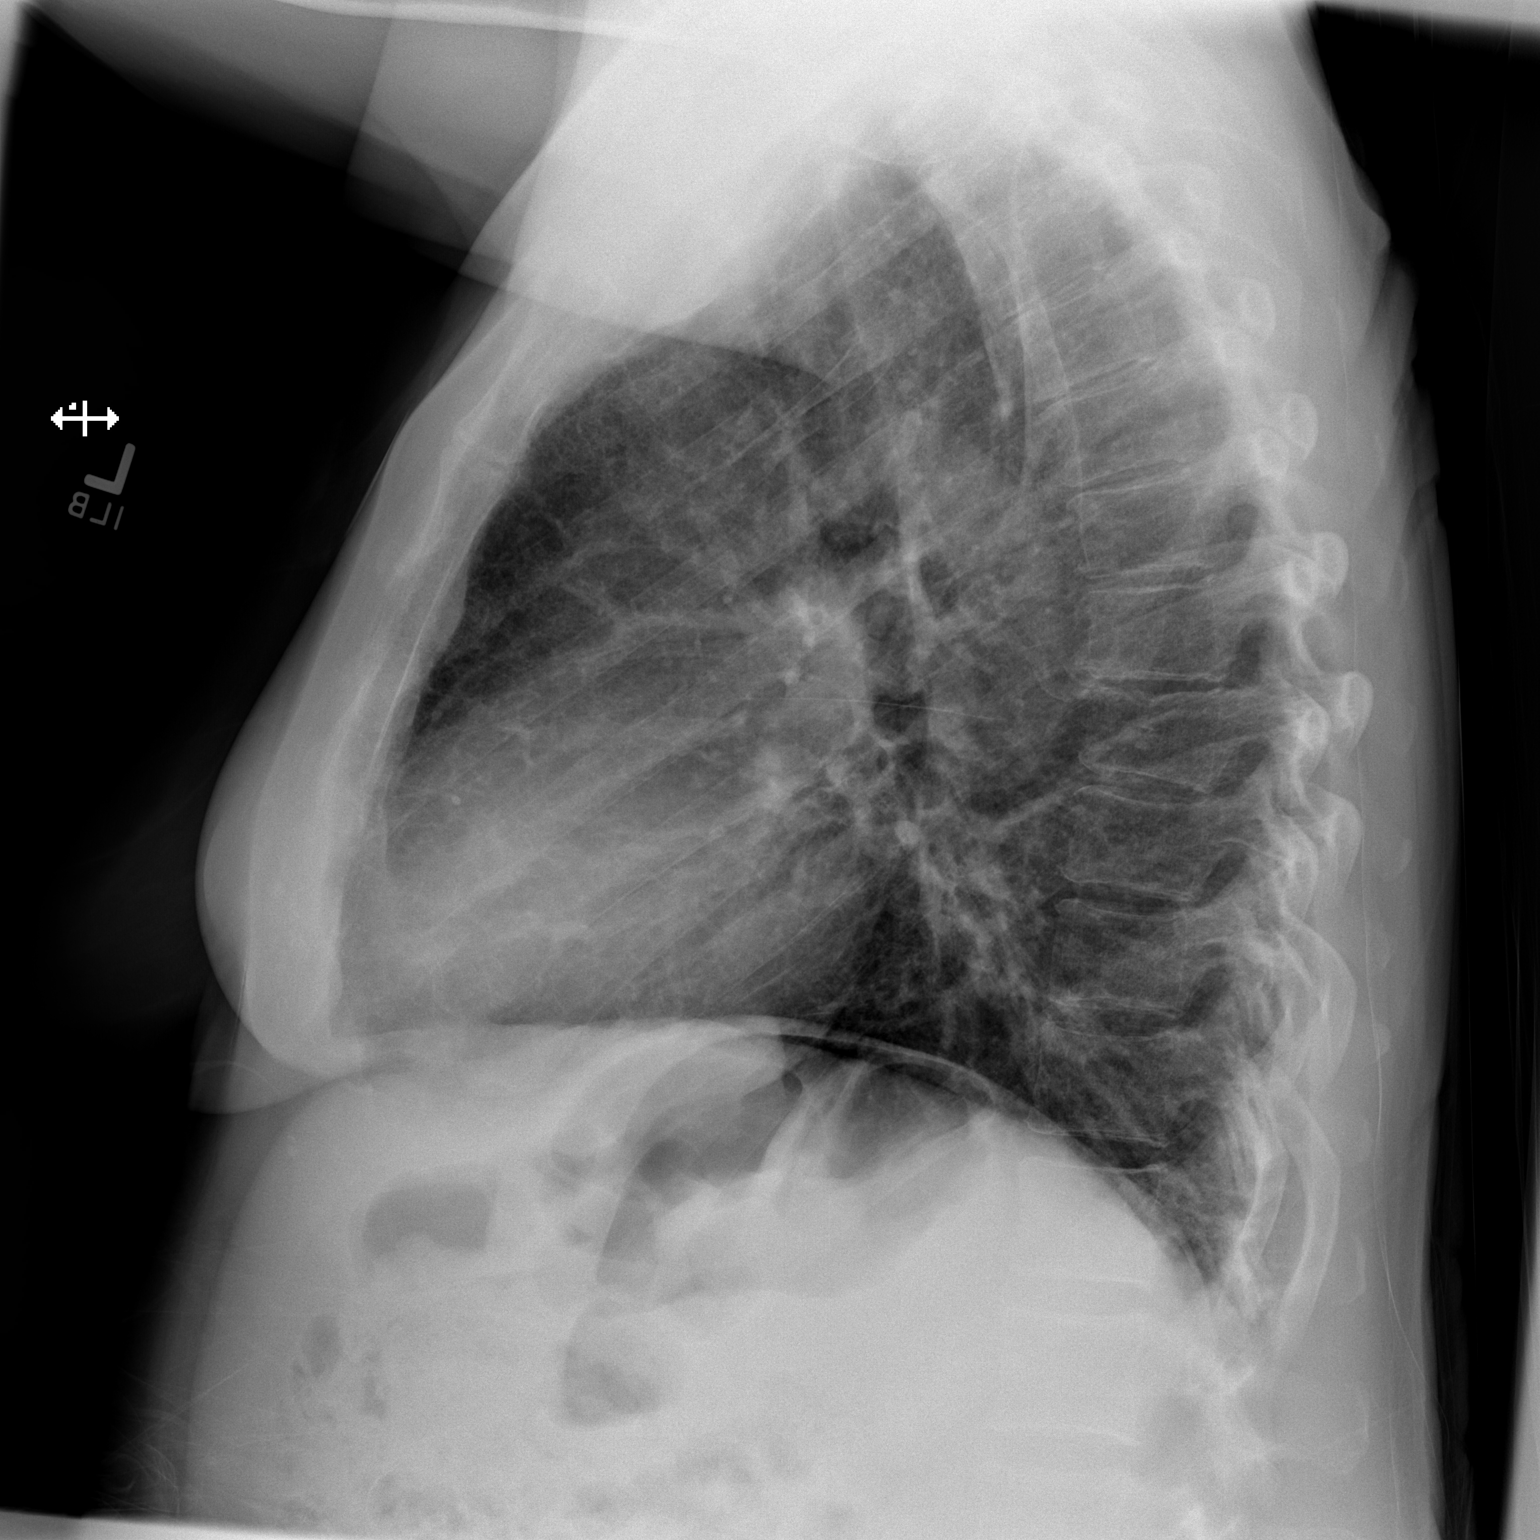

[2 of 2 positions shown; findings below may reference images not displayed]

IMPRESSION: Interim near complete clearing of right lower lobe
infiltrate.

## 2013-07-06 ENCOUNTER — Emergency Department: Payer: Self-pay | Admitting: Emergency Medicine

## 2013-07-18 ENCOUNTER — Other Ambulatory Visit: Payer: Self-pay | Admitting: Family Medicine

## 2013-07-21 IMAGING — CR DG KNEE COMPLETE 4+V*L*
1 series · 4 of 4 positions shown · non-contrast
Comparison: none

REASON FOR EXAM: pain s/p fall
COMMENTS:

PROCEDURE:     DXR - DXR KNEE LT COMP WITH OBLIQUES  - October 28, 2012  [DATE]
RESULT:     Comparison: None.

[Series 7: t knee ap left · 0.14mm/px · 4 of 4 slices shown]
[im 1/4]
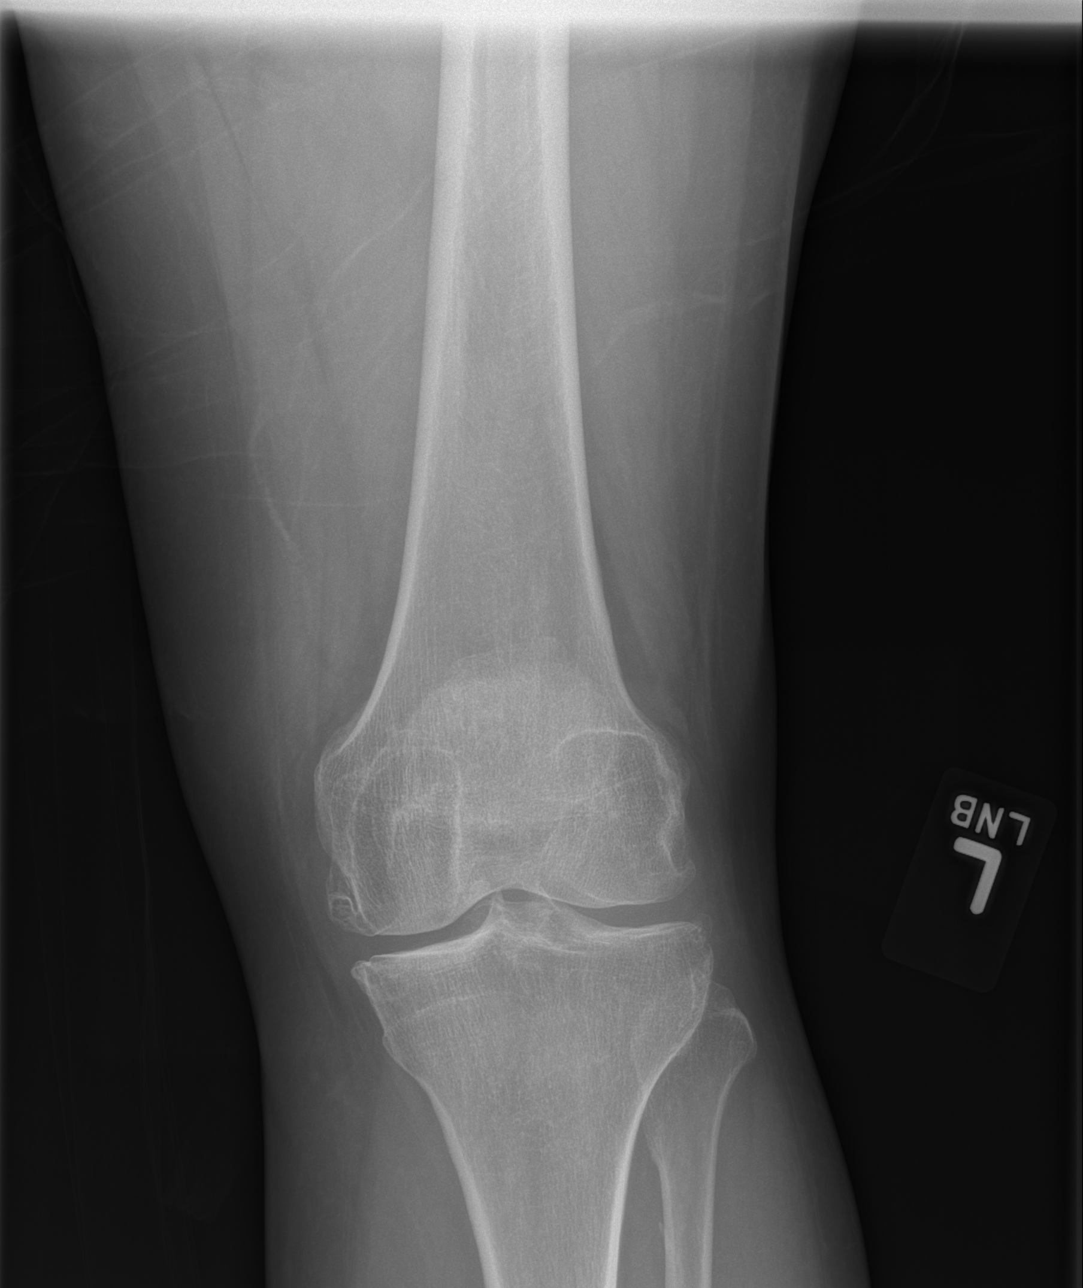
[im 2/4]
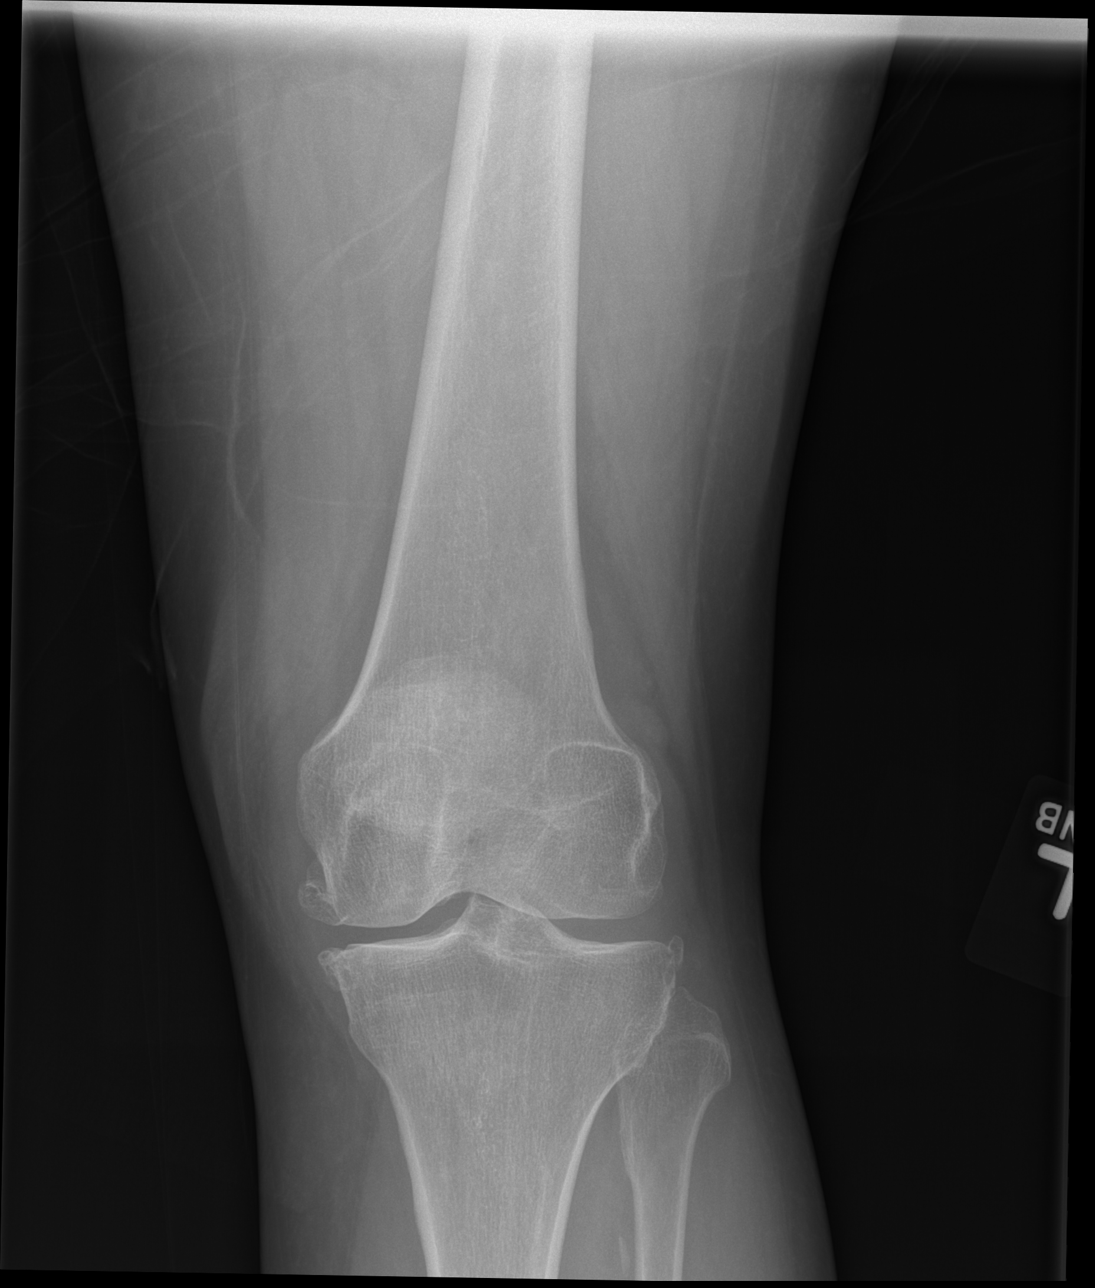
[im 3/4]
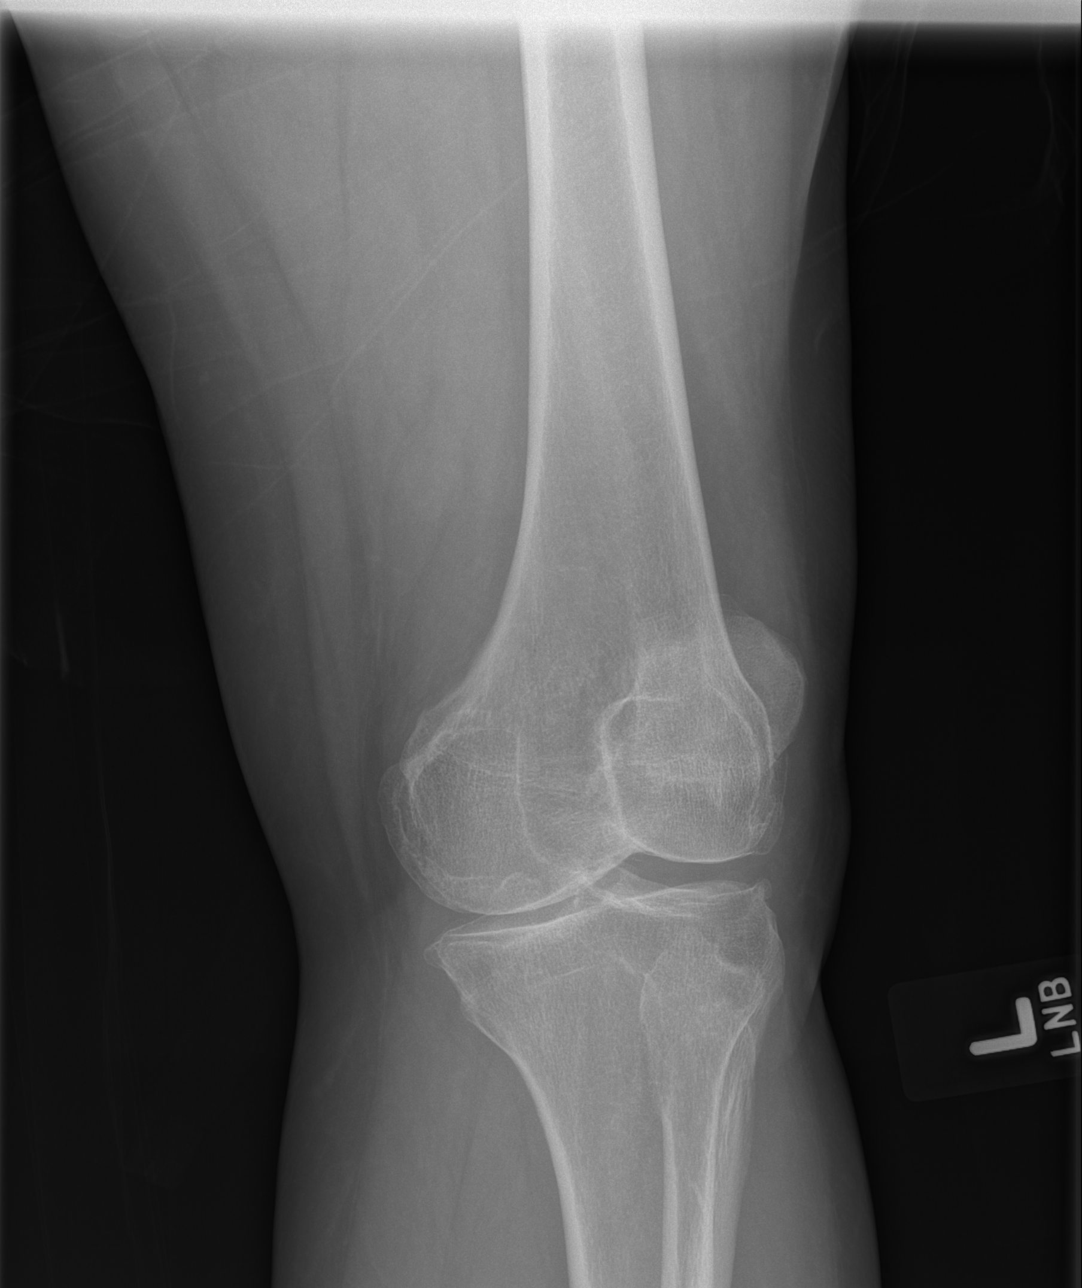
[im 4/4]
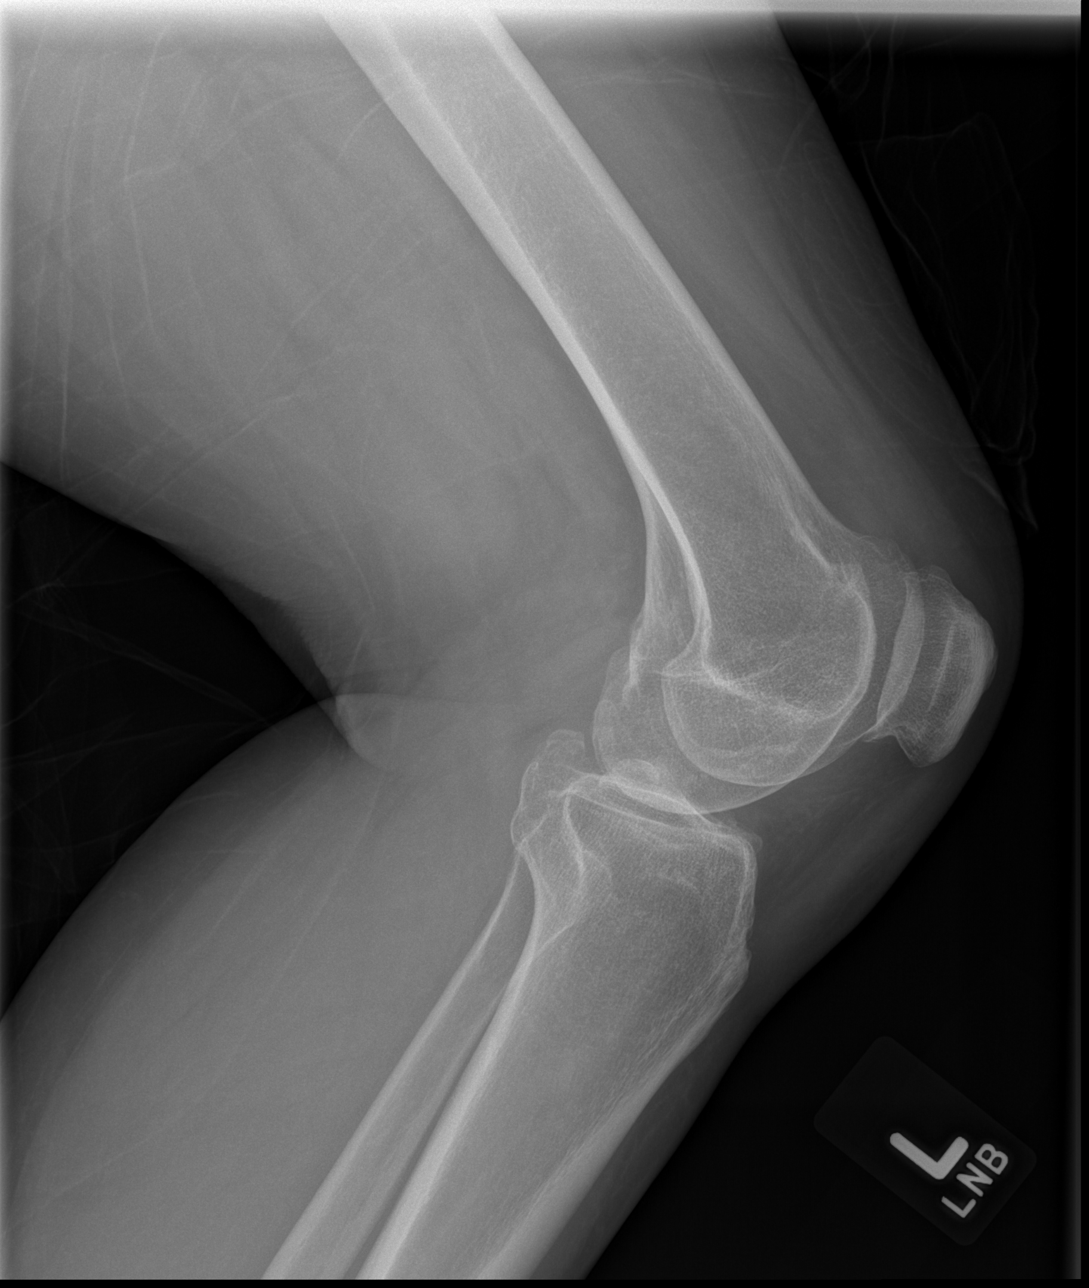

[4 of 4 positions shown; findings below may reference images not displayed]

FINDINGS: There are mild degenerative changes in the medial and lateral compartments.
No significant effusion. No acute fracture.
IMPRESSION: No acute fracture.

[REDACTED]

## 2013-09-13 IMAGING — CR DG CHEST 2V
1 series · 2 of 2 positions shown · non-contrast
Comparison: none

REASON FOR EXAM: SOB
COMMENTS:

[Series 1: w chest pa · 0.14mm/px · 2 of 2 slices shown]
[im 1/2]
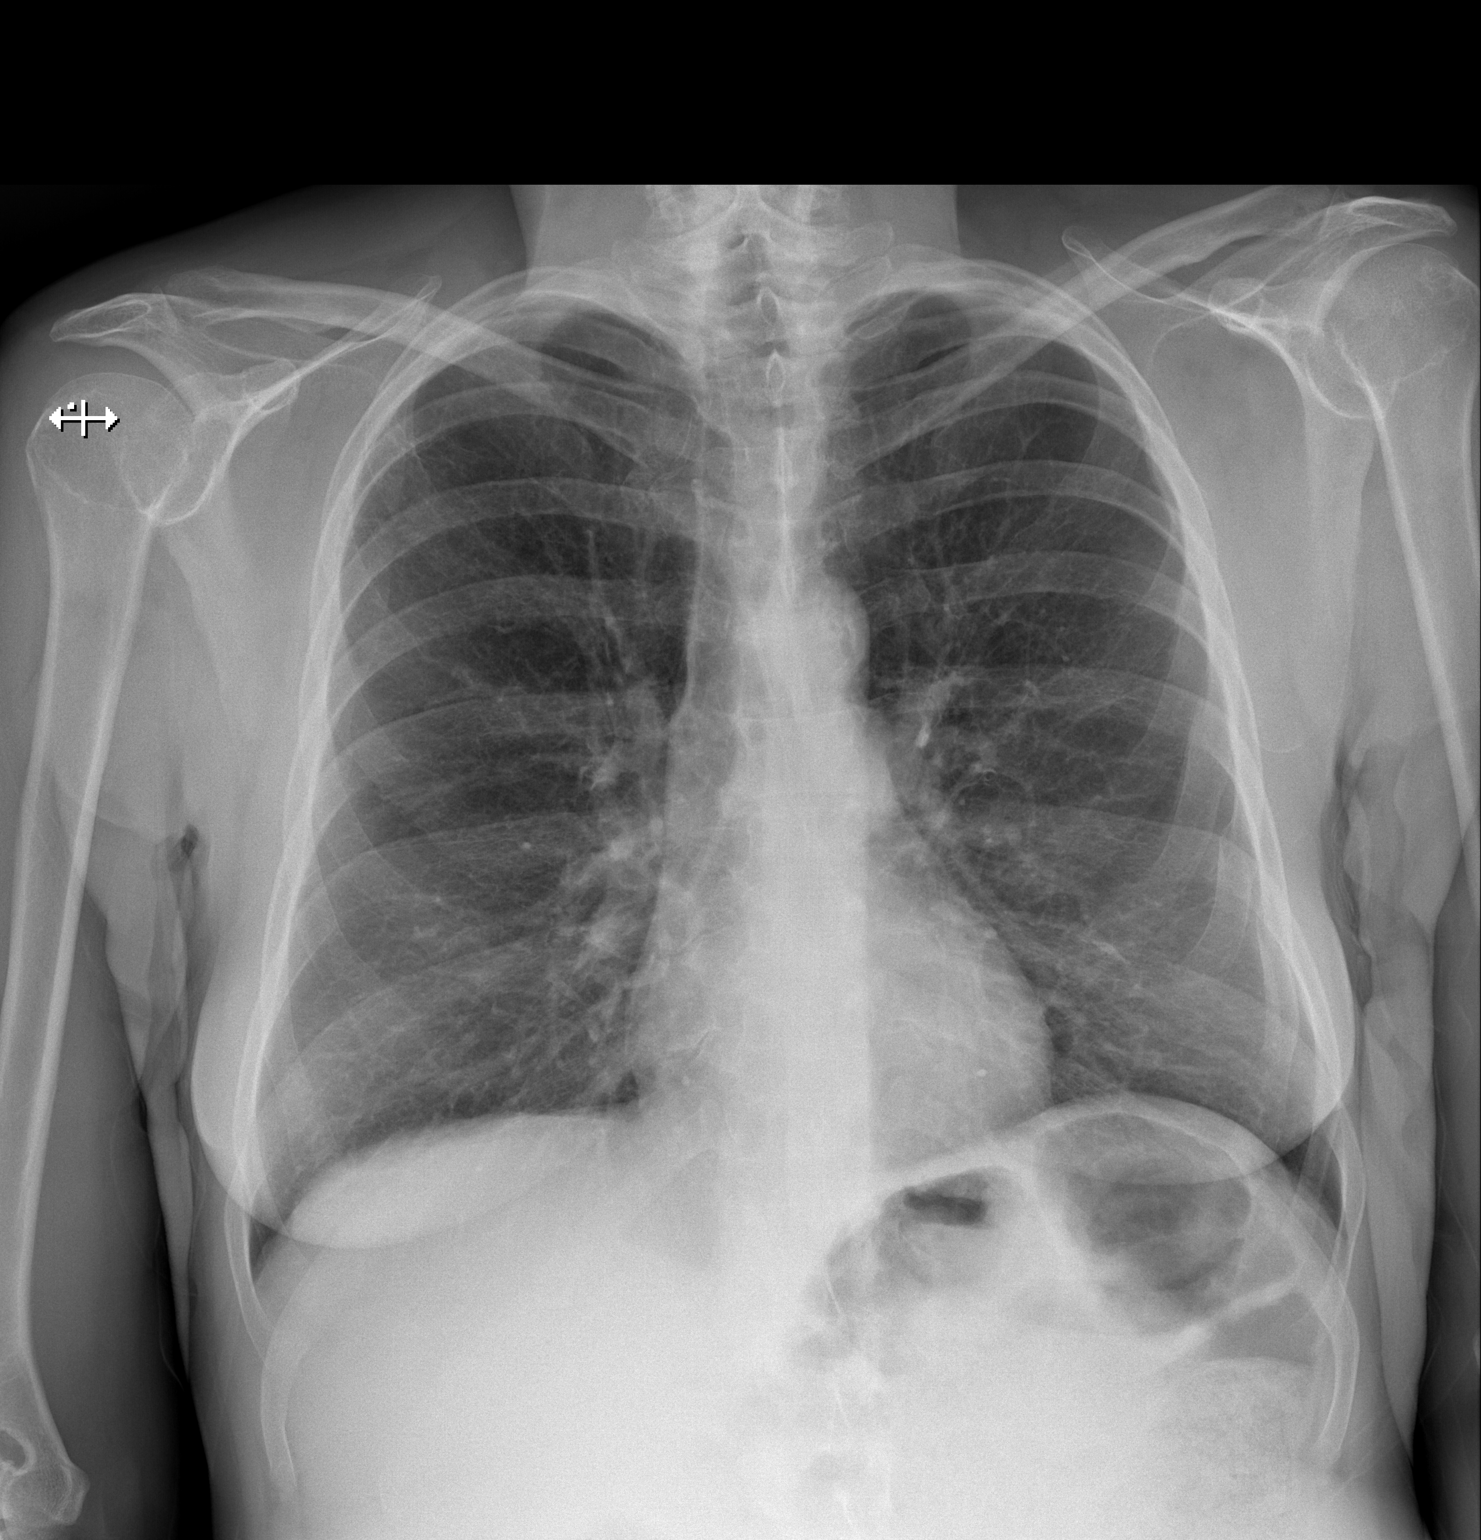
[im 2/2]
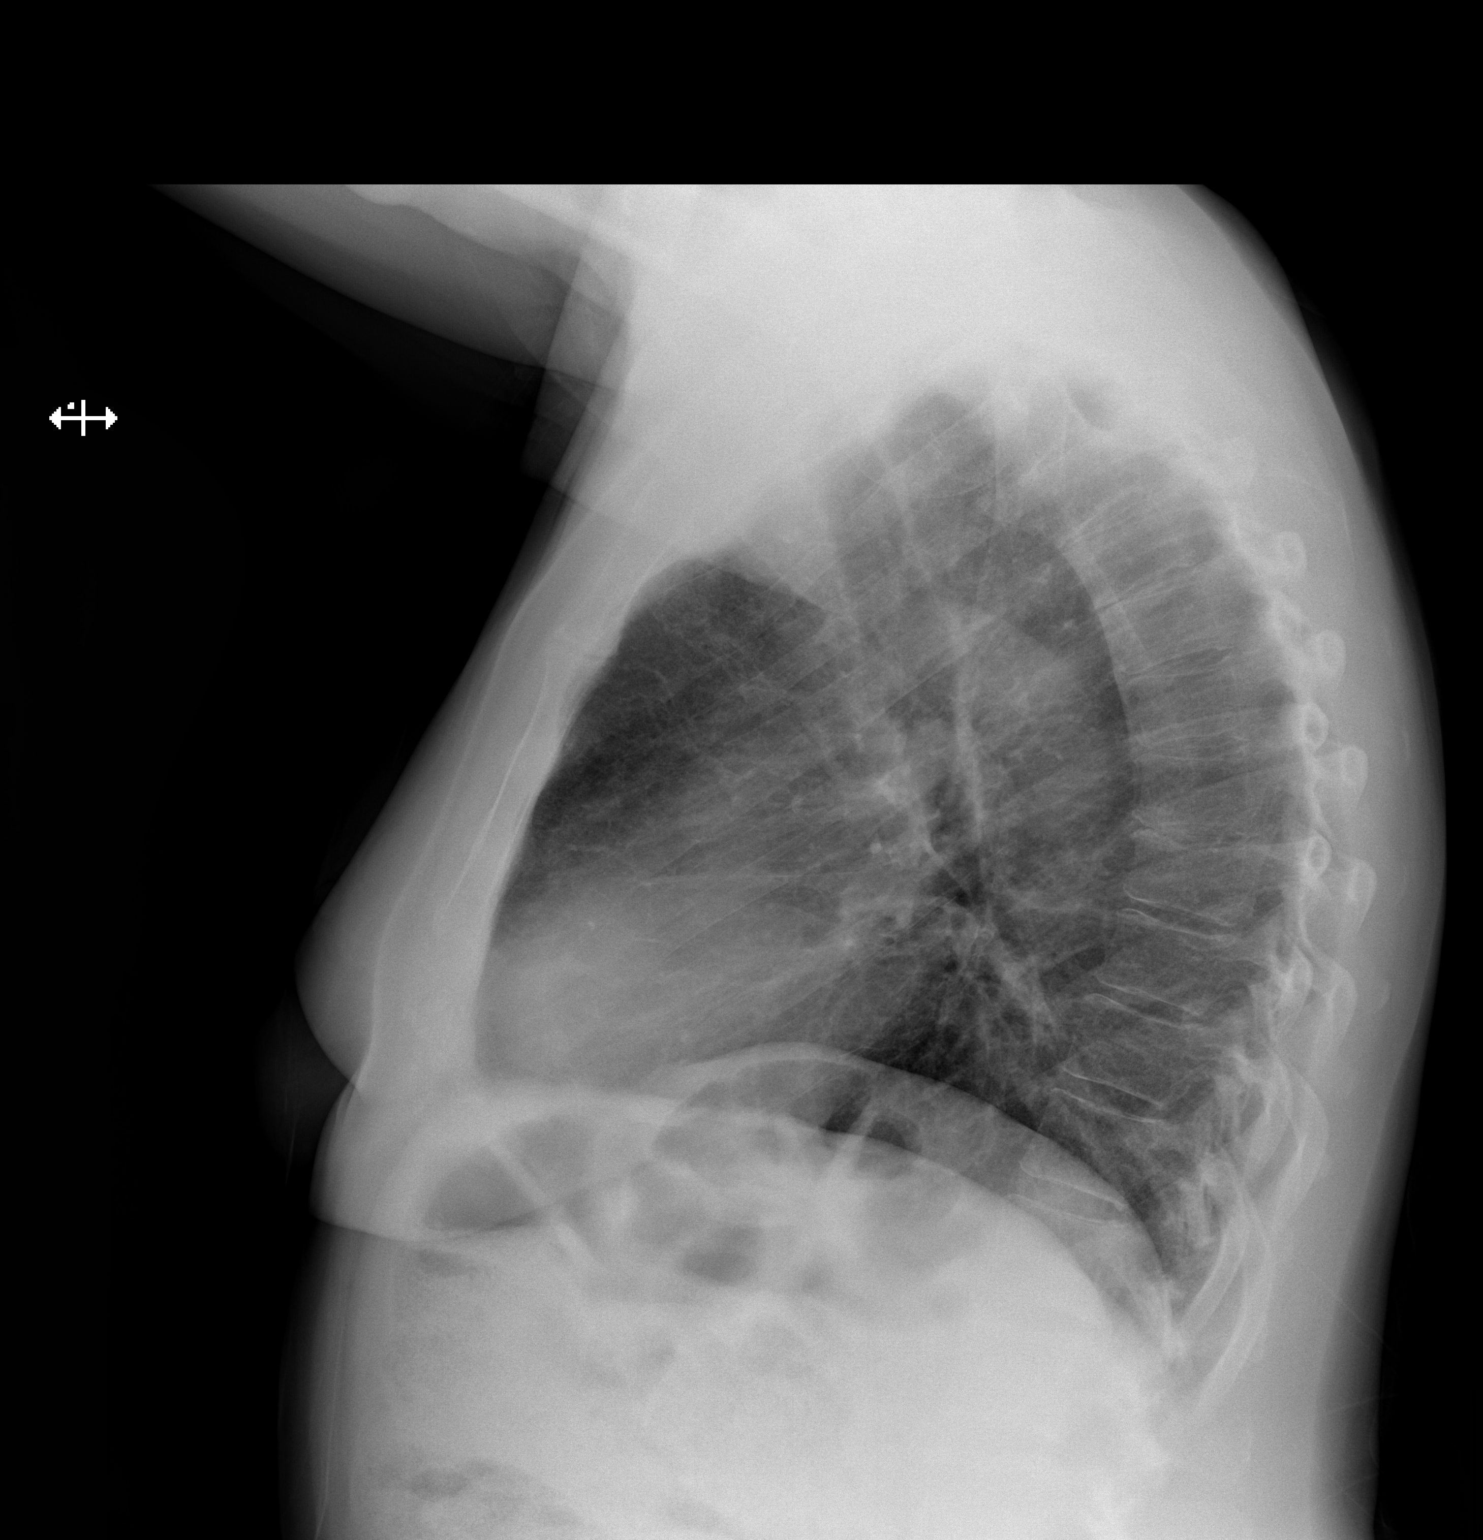

[2 of 2 positions shown; findings below may reference images not displayed]

PROCEDURE:     DXR - DXR CHEST PA (OR AP) AND LATERAL  - December 21, 2012  [DATE]

RESULT:     Comparison is made to the study October 11, 2012 and September 22, 2012.

The lungs are well-expanded. There is no focal infiltrate. The cardiac
silhouette is normal in size. The pulmonary vascularity is not engorged. The
interstitial markings are mildly prominent. There is no pleural effusion.
Old deformity of the anterior aspects of multiple left ribs is demonstrated.
IMPRESSION: There is no evidence of pneumonia. There are findings
consistent with COPD. There are old deformities of the anterior aspects of
several left ribs which were visible on a study September 22, 2012.

[REDACTED]

## 2013-09-17 IMAGING — CR DG SHOULDER 3+V*L*
1 series · 3 of 3 positions shown · non-contrast
Comparison: none

REASON FOR EXAM: pain
COMMENTS:

[Series 3: w shoulder internal left · 0.14mm/px · 3 of 3 slices shown]
[im 1/3]
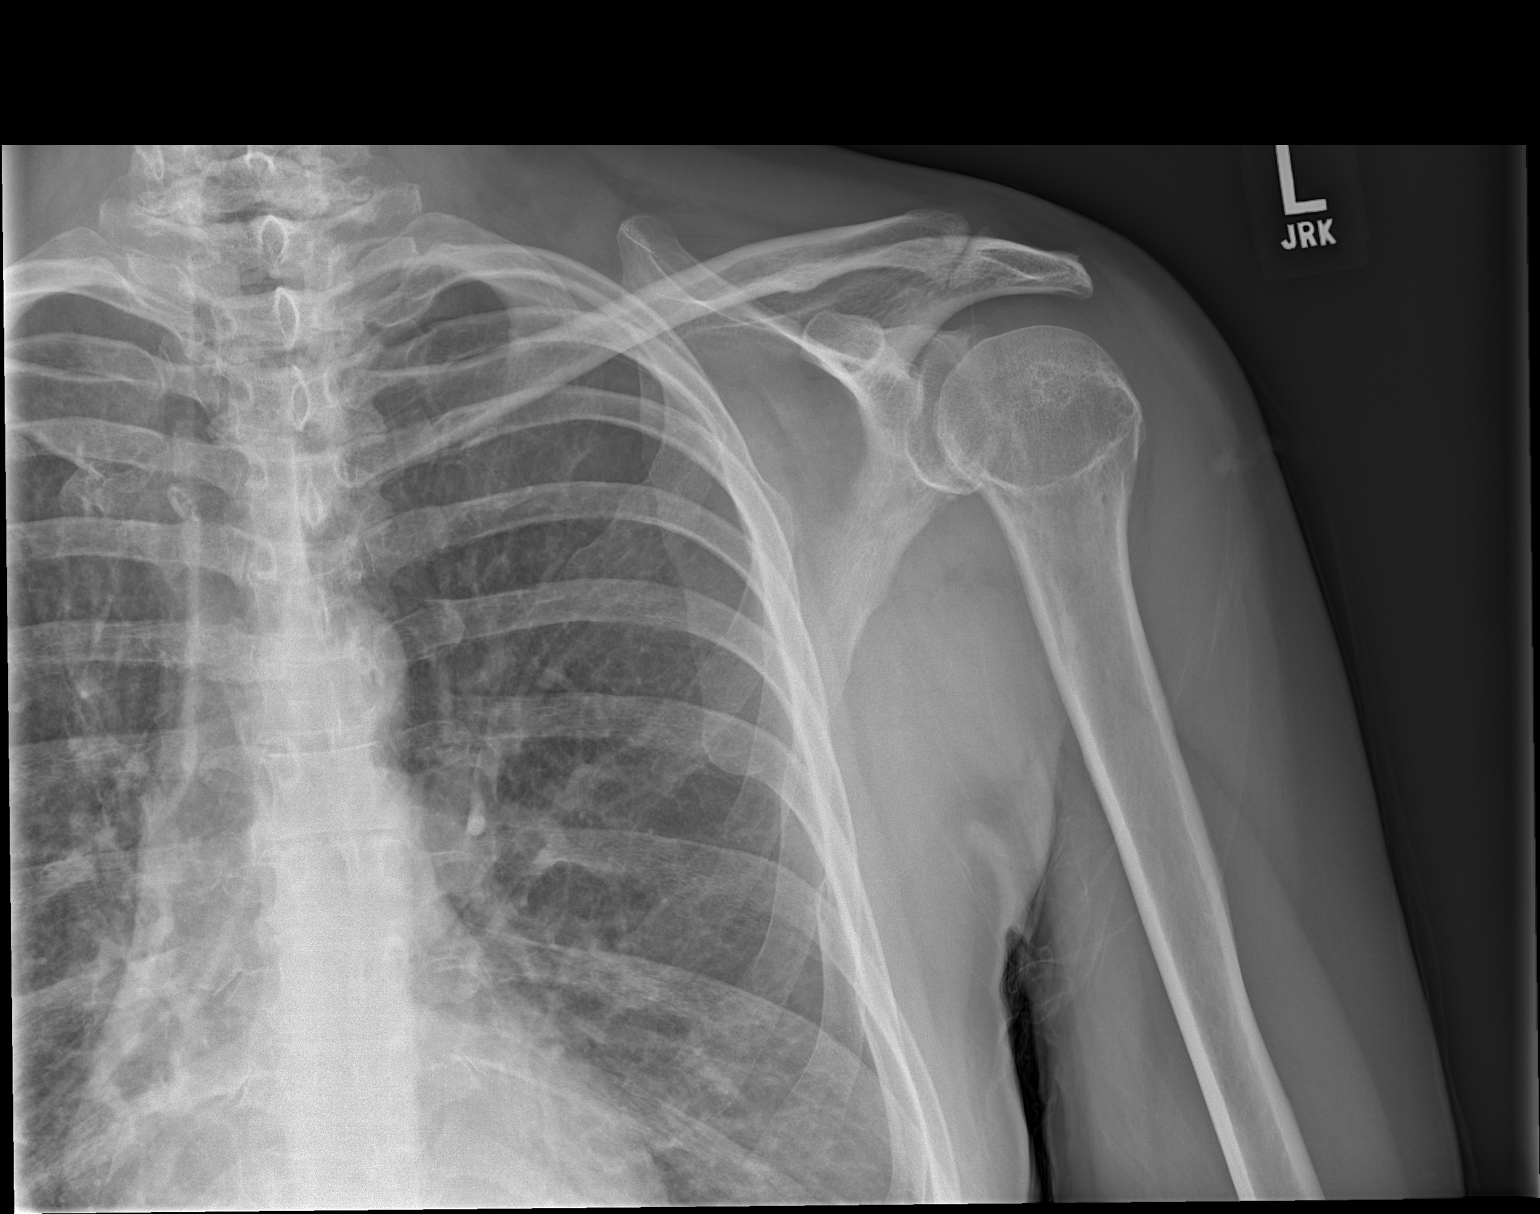
[im 2/3]
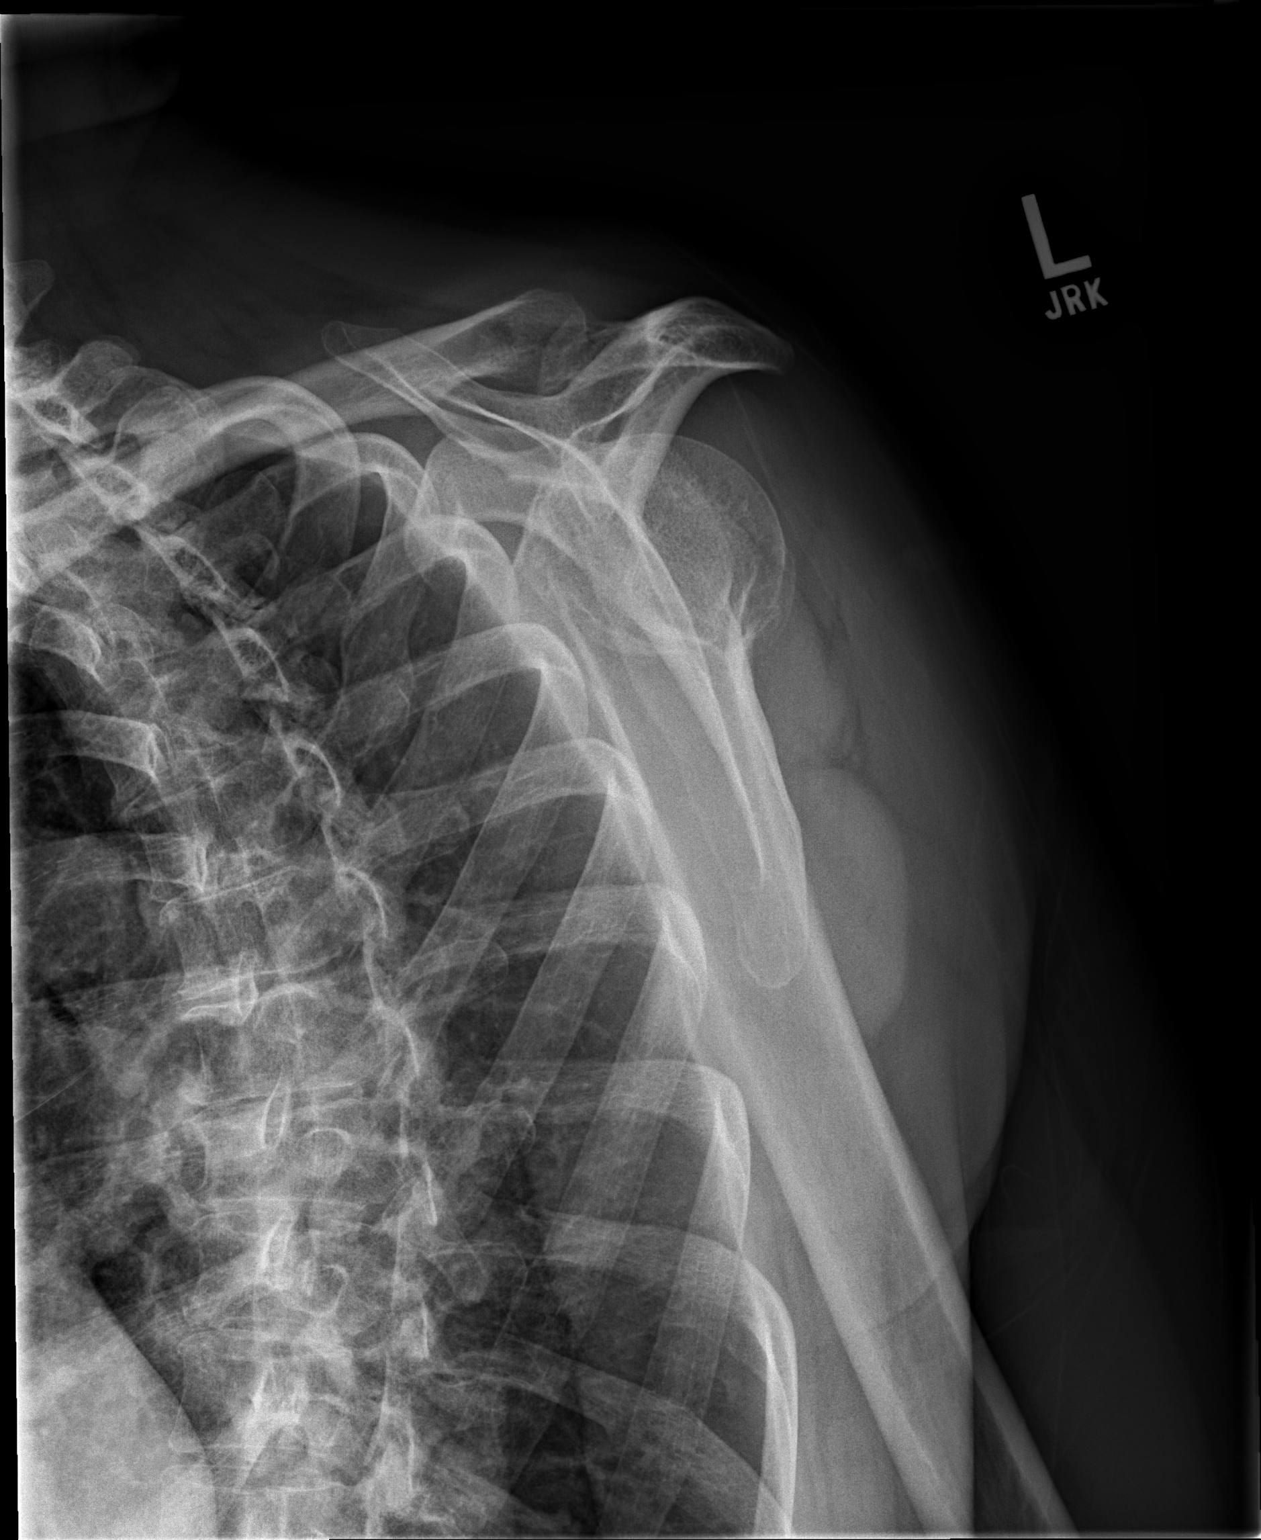
[im 3/3]
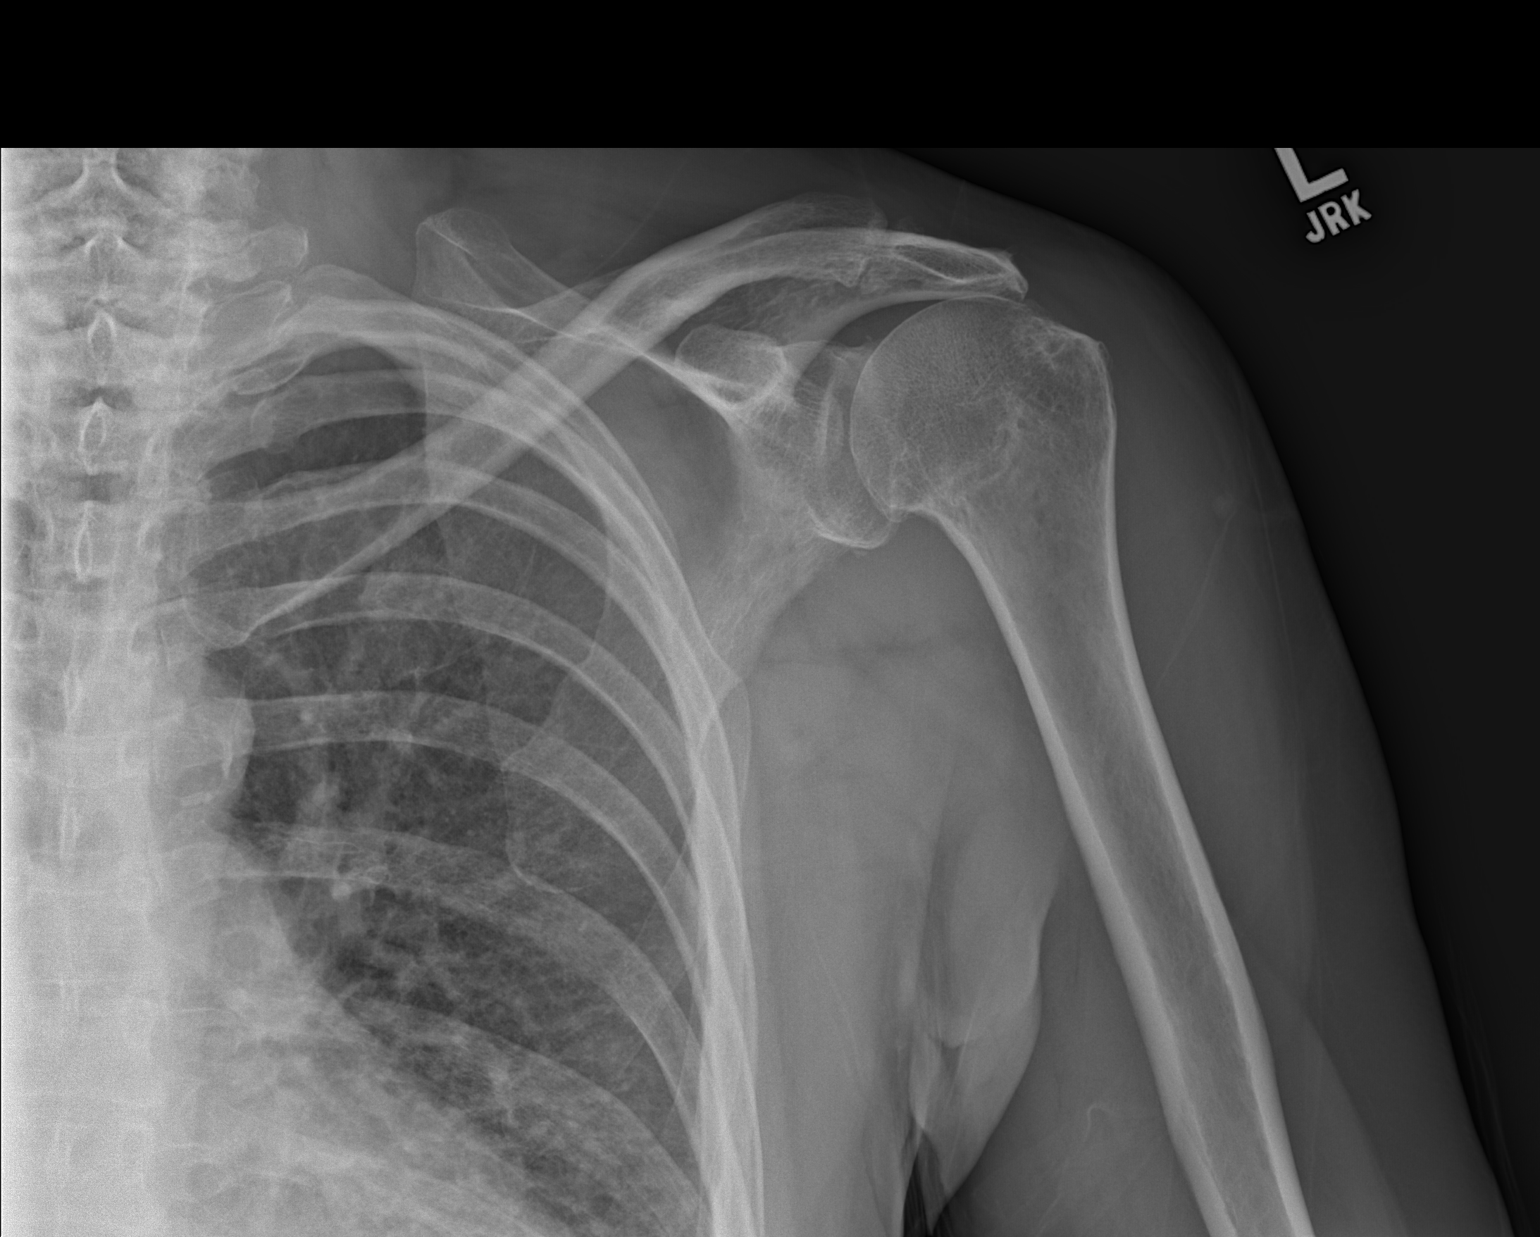

[3 of 3 positions shown; findings below may reference images not displayed]

PROCEDURE:     DXR - DXR SHOULDER LEFT COMPLETE  - December 25, 2012  [DATE]

RESULT:     Three views of the left shoulder reveal the bones to be
adequately mineralized. There is no acute fracture nor dislocation. There is
mild degenerative change of the AC joint. The clavicle and observed portions
of the left ribs exhibit no acute abnormalities. Old deformity of the
lateral aspect of the left fifth sixth ribs is noted.
IMPRESSION: There is no acute bony abnormality of the left shoulder.

[REDACTED]

## 2013-09-25 IMAGING — CT CT HEAD WITHOUT CONTRAST
3 of 4 series · 17 of 30 positions shown, 20 images · non-contrast
Comparison: none

REASON FOR EXAM: altered mental status
COMMENTS:

PROCEDURE:     CT  - CT HEAD WITHOUT CONTRAST  - January 02, 2013  [DATE]
RESULT:     History: Altered mental status.
Comparison Study: CT of 05/23/2012.

[Series 3: facial 3.0 h60f · axial · 0.31mm/px · z∈[-226,-120]mm · 5 of 53 slices shown (1 of 2)]
[im 9/53  brain]
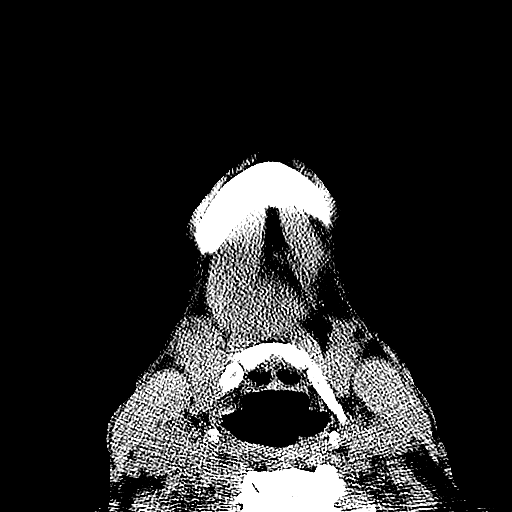
[im 18/53  brain]
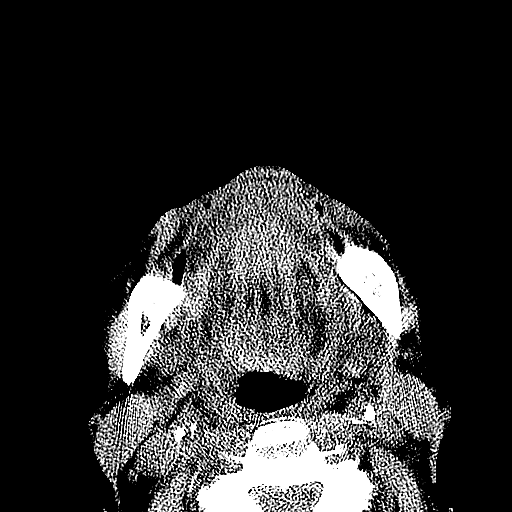
[im 27/53  brain]
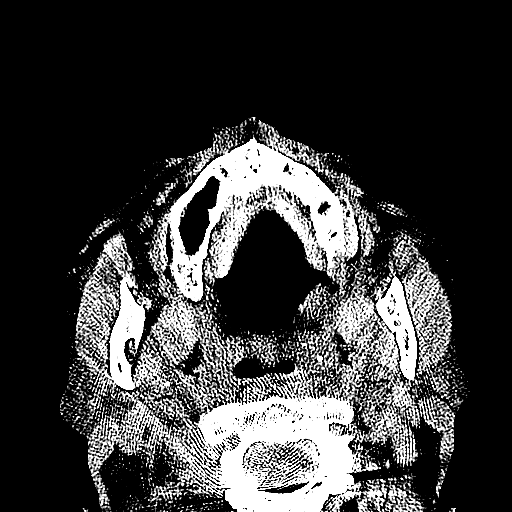
[im 35/53  brain]
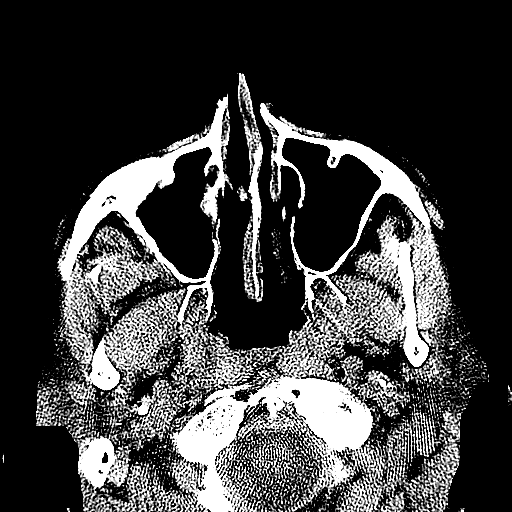
[im 44/53  brain]
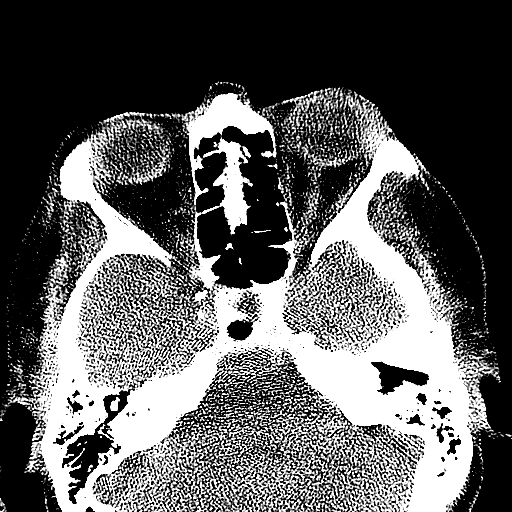

[Series 5: facial 3.0 h60f · axial · 0.31mm/px · z∈[-226,-172]mm · 3 of 53 slices shown (2 of 2)]
[im 9/53  brain]
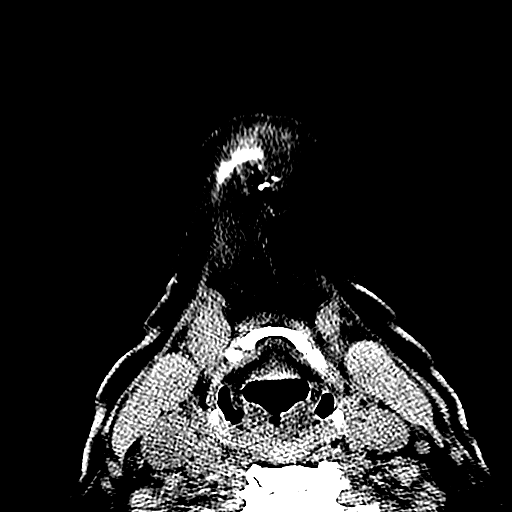
[im 18/53  brain]
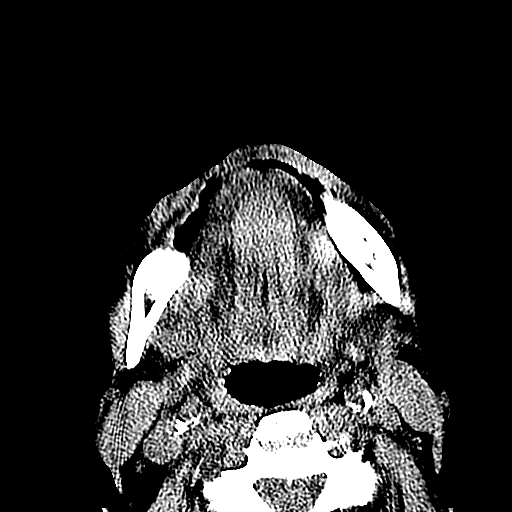
[im 27/53  brain]
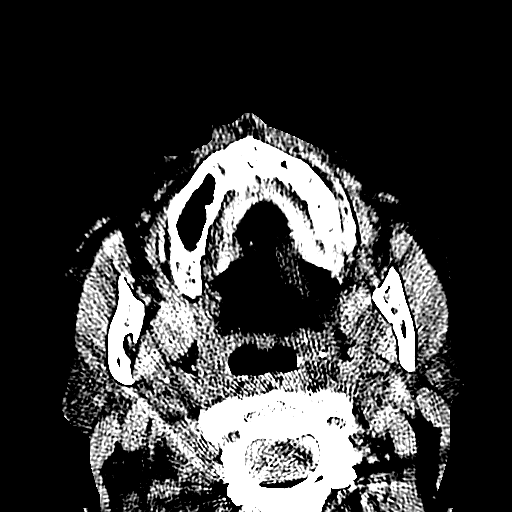

[Series 12: axial · axial · 0.33mm/px · z∈[-294,-181]mm · 9 of 82 slices shown, 12 images]
[im 9/82  brain]
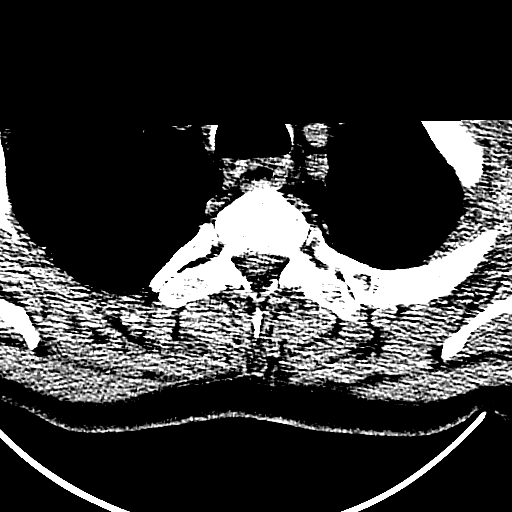
[im 9/82  bone]
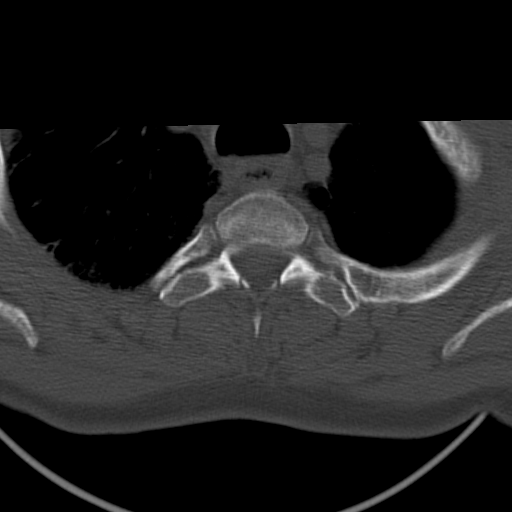
[im 17/82  brain]
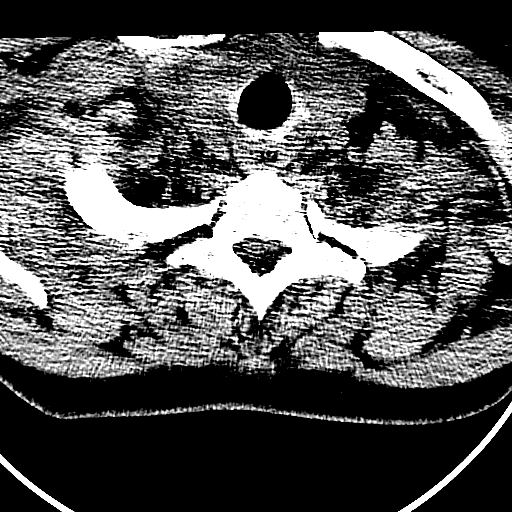
[im 25/82  brain]
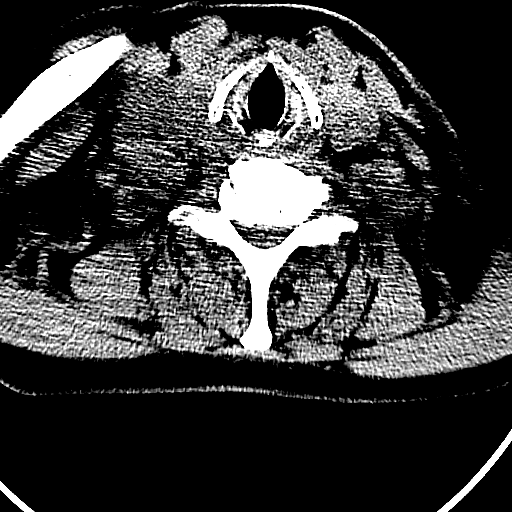
[im 33/82  brain]
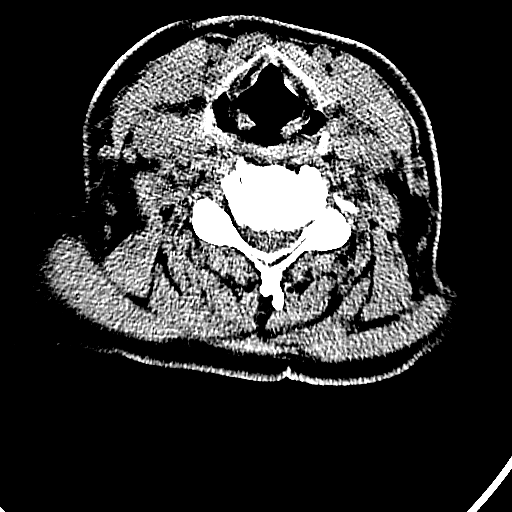
[im 41/82  brain]
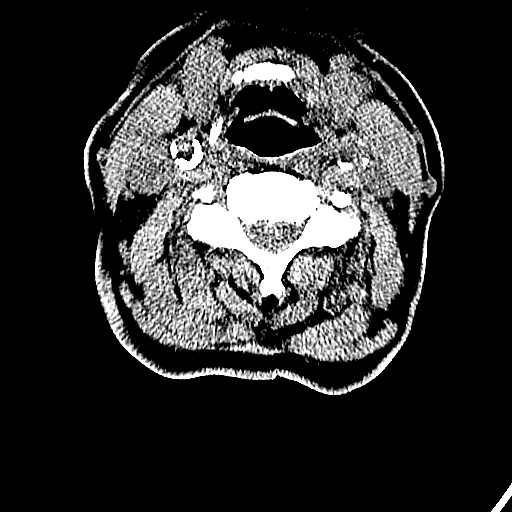
[im 41/82  bone]
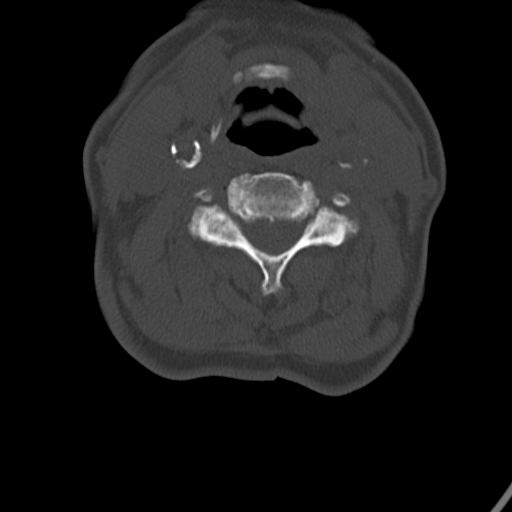
[im 49/82  brain]
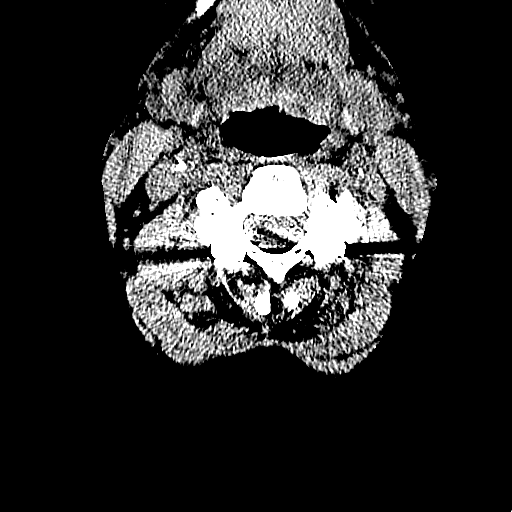
[im 57/82  brain]
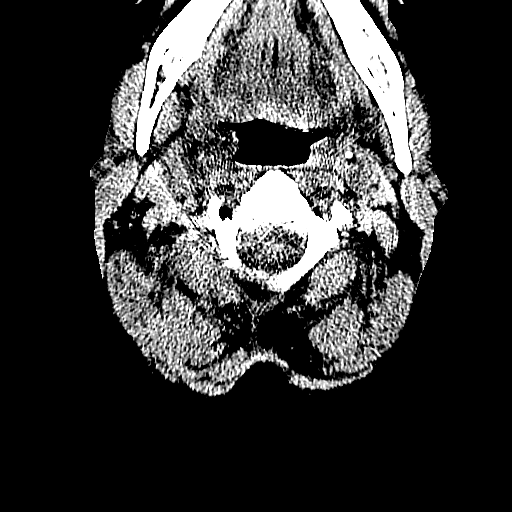
[im 65/82  brain]
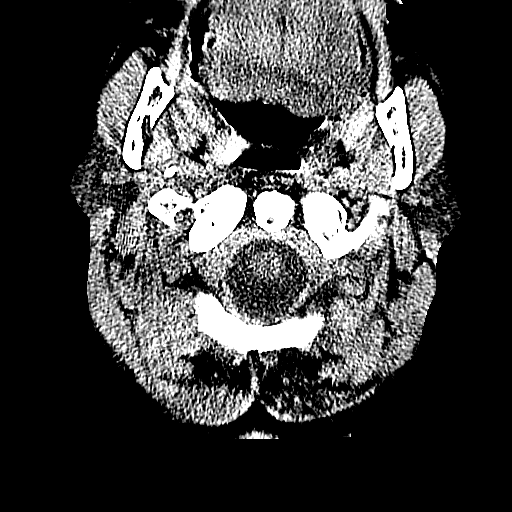
[im 73/82  brain]
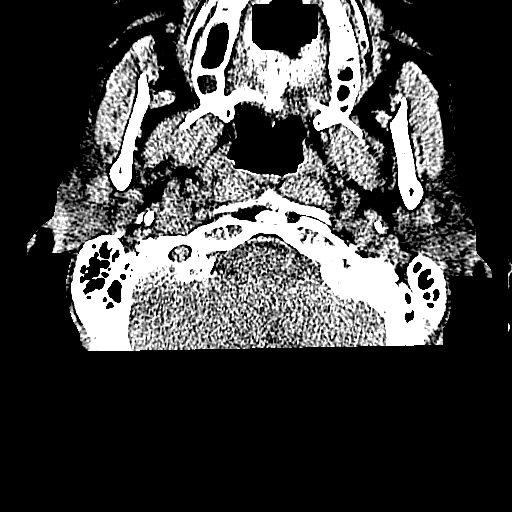
[im 73/82  bone]
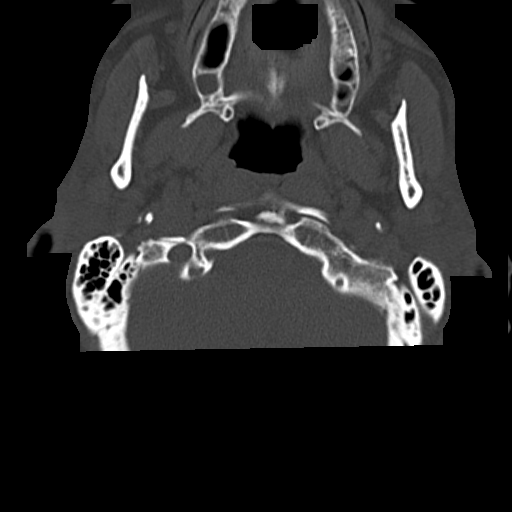

[17 of 30 positions shown; findings below may reference images not displayed]

FINDINGS: Standard nonenhanced CT obtained. No mass. No hydrocephalus. No
hemorrhage. No acute bony abnormality identified. No acute bony abnormality
identified bilateral nasal bone fractures noted. These appear old.
IMPRESSION: No acute abnormality. Nasal bone fractures appear old. Also
noted postsurgical changes of the cervical spine.

## 2013-10-04 IMAGING — CT CT CERVICAL SPINE WITHOUT CONTRAST
2 series · 10 of 14 positions shown, 12 images · non-contrast
Comparison: none

REASON FOR EXAM: AMS with unknown trauma (found on floor x 3 days)
COMMENTS:

[Series 4: axial · axial · 0.33mm/px · z∈[+1180,+1299]mm · 7 of 82 slices shown, 9 images (1 of 2)]
[im 11/82  soft-tissue]
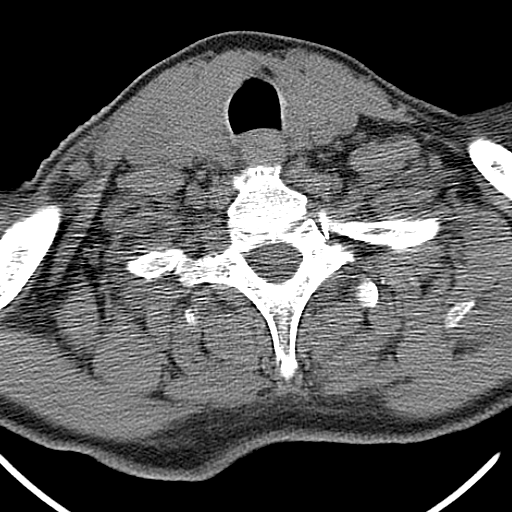
[im 11/82  bone]
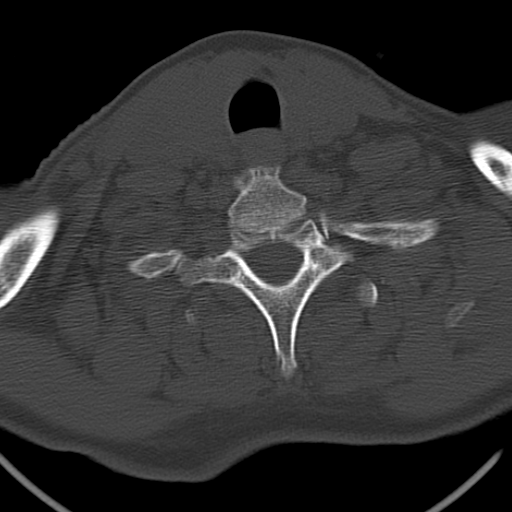
[im 21/82  bone]
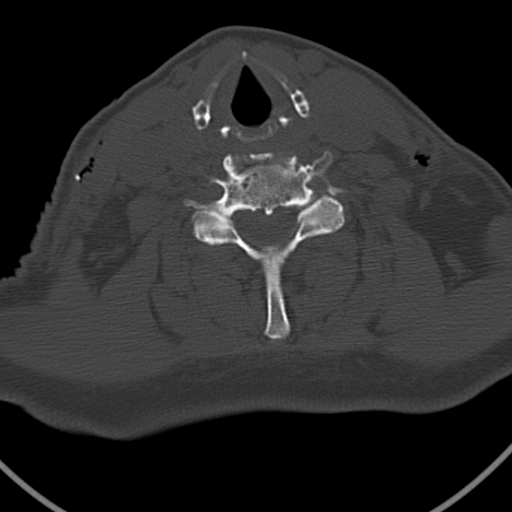
[im 31/82  bone]
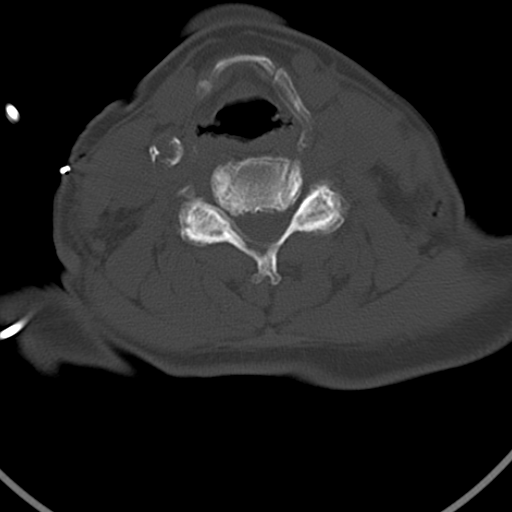
[im 41/82  bone]
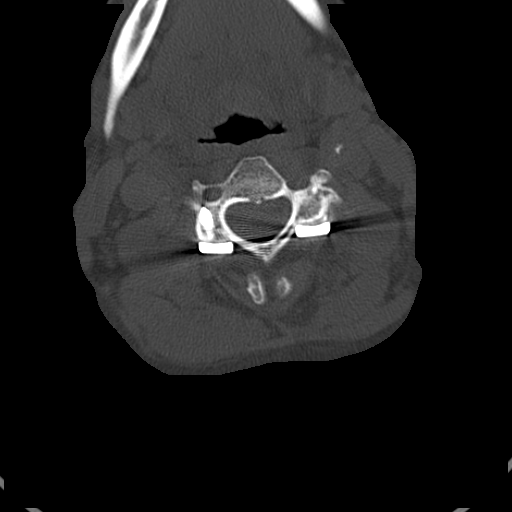
[im 51/82  soft-tissue]
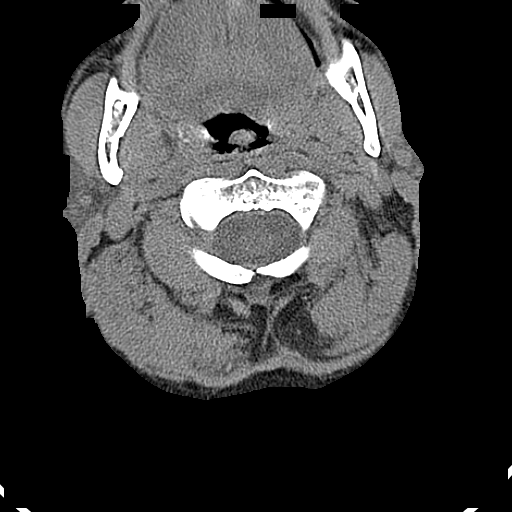
[im 51/82  bone]
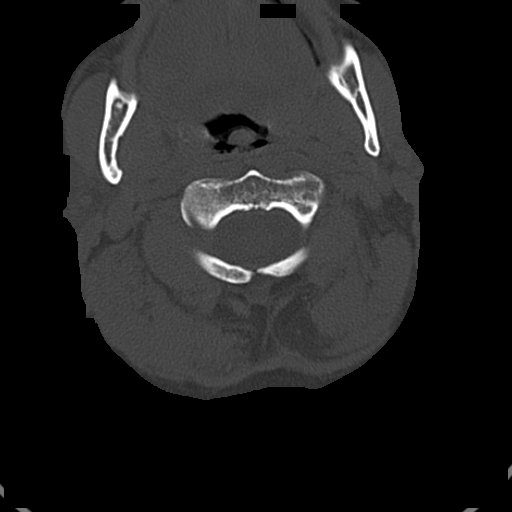
[im 61/82  bone]
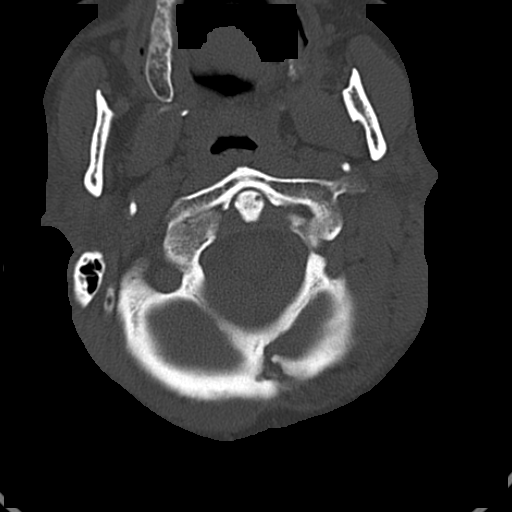
[im 71/82  bone]
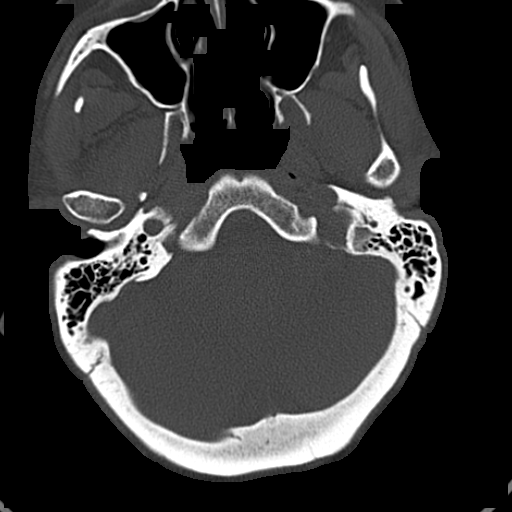

[Series 9: axial · axial · 0.33mm/px · z∈[+1182,+1225]mm · 3 of 46 slices shown (2 of 2)]
[im 12/46  bone]
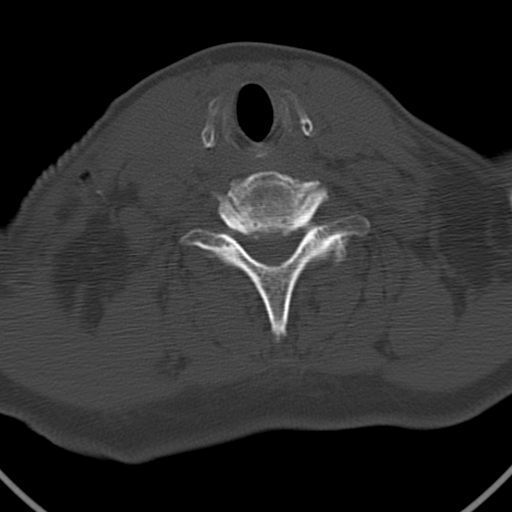
[im 23/46  bone]
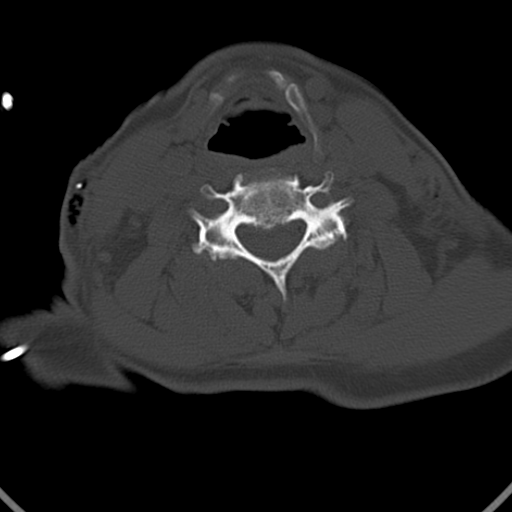
[im 34/46  bone]
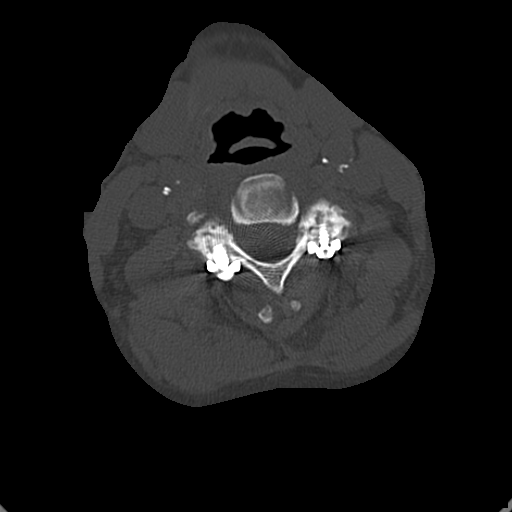

[10 of 14 positions shown; findings below may reference images not displayed]

PROCEDURE:     CT  - CT CERVICAL SPINE WO  - January 11, 2013  [DATE]

RESULT:     Multislice helical acquisition through the cervical spine is
reconstructed at bone window settings in the axial, coronal and sagittal
planes at 2 mm slice thickness increments. Comparison is made to the
previous examination performed on 02 January, 2013.

The patient is undergone previous posterior stabilization at the C2-C3 level
bilaterally. Multilevel degenerative disc narrowing is seen from C4 through
C7 with multilevel degenerative endplate spurring. There is no evidence of
fracture or subluxation. The craniocervical junction and atlantoaxial
alignment appear to be normal. Multilevel facet arthropathy is present.
IMPRESSION: 1. Stable CT of the cervical spine with postoperative and degenerative
changes. No acute bony abnormality seen.

[REDACTED]

## 2013-10-04 IMAGING — CT CT HEAD WITHOUT CONTRAST
3 of 4 series · 17 of 30 positions shown, 19 images · non-contrast
Comparison: none

REASON FOR EXAM: ams
COMMENTS:

[Series 2: without · axial · non-contrast · 0.42mm/px · z∈[+1298,+1398]mm · 5 of 30 slices shown (1 of 2)]
[im 5/30  brain]
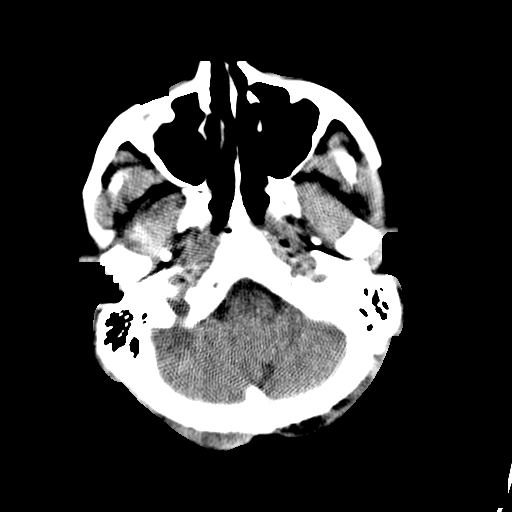
[im 10/30  brain]
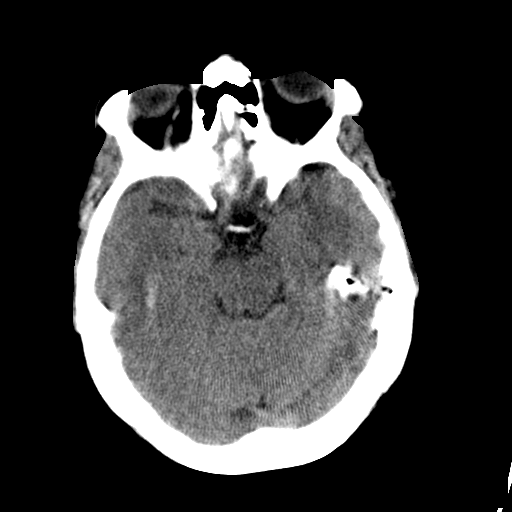
[im 15/30  brain]
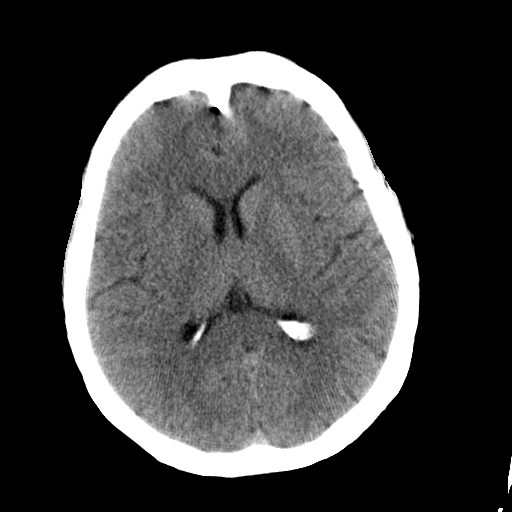
[im 20/30  brain]
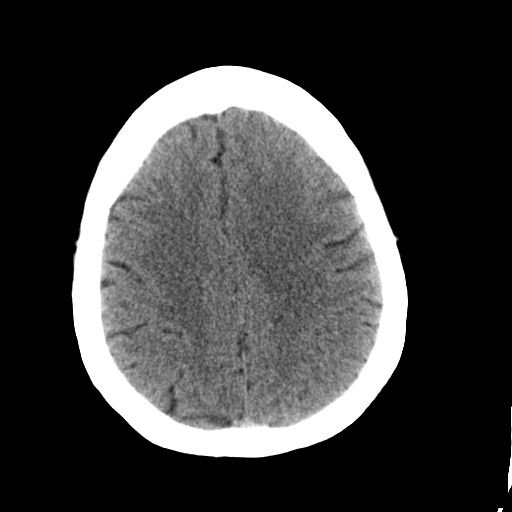
[im 25/30  brain]
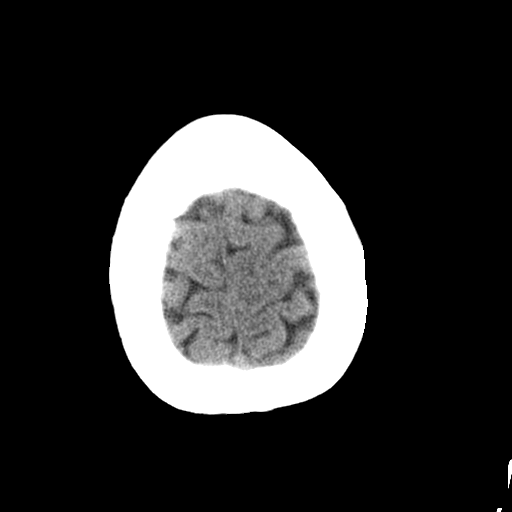

[Series 4: without · axial · non-contrast · 0.42mm/px · z∈[+1298,+1398]mm · 6 of 30 slices shown, 8 images (2 of 2)]
[im 5/30  brain]
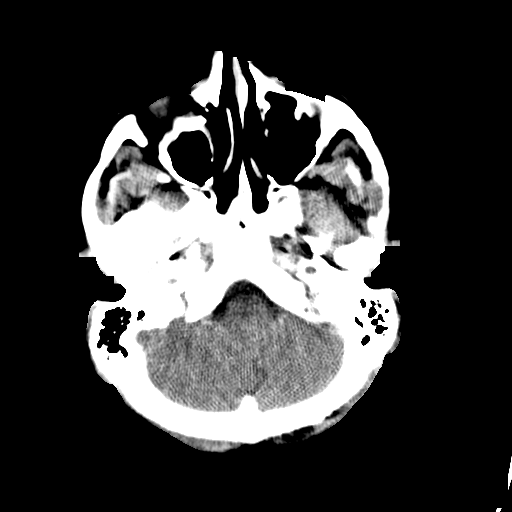
[im 5/30  bone]
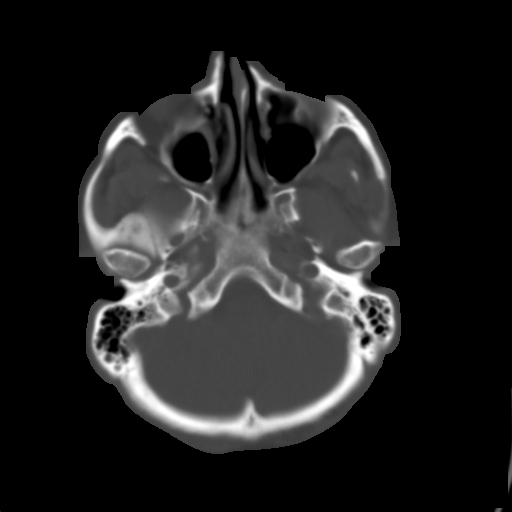
[im 9/30  brain]
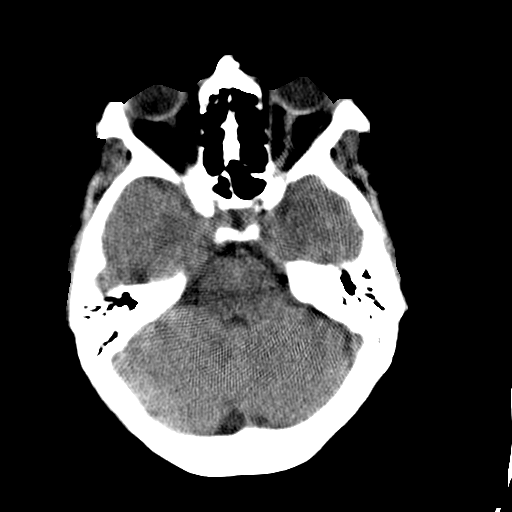
[im 13/30  brain]
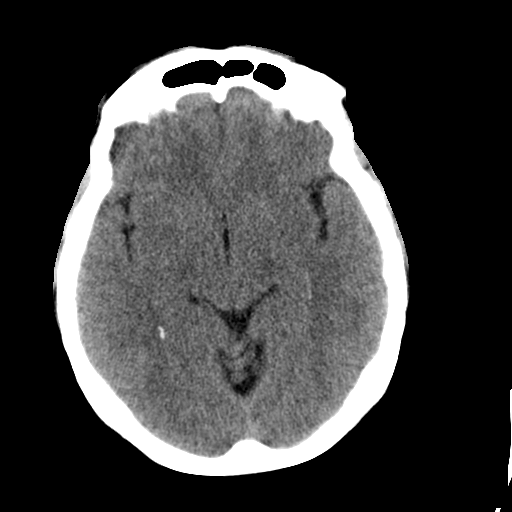
[im 17/30  brain]
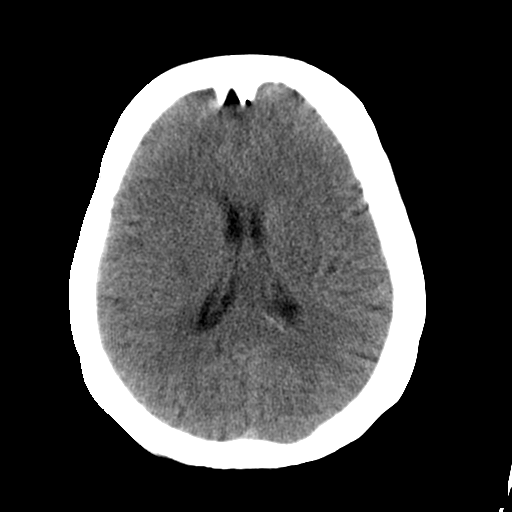
[im 21/30  brain]
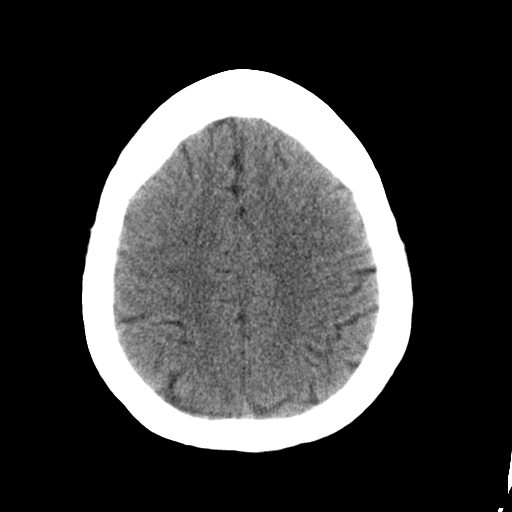
[im 21/30  bone]
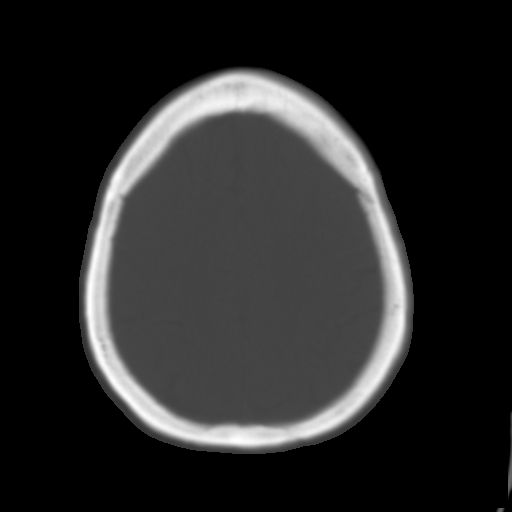
[im 25/30  brain]
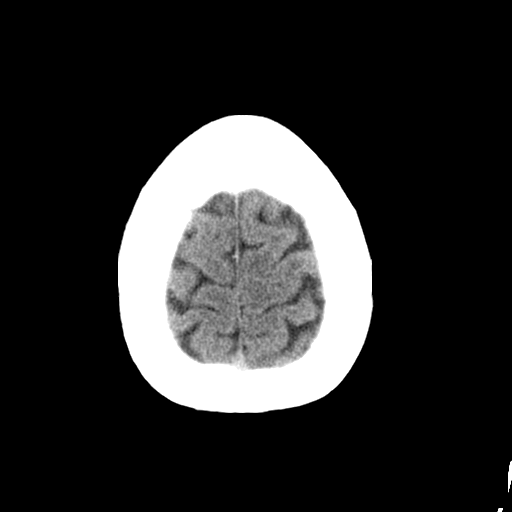

[Series 5: bone · axial · 0.42mm/px · z∈[+1298,+1398]mm · 6 of 30 slices shown]
[im 5/30  bone]
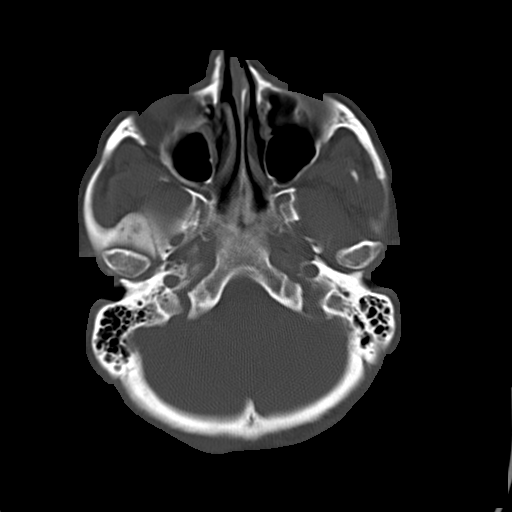
[im 9/30  bone]
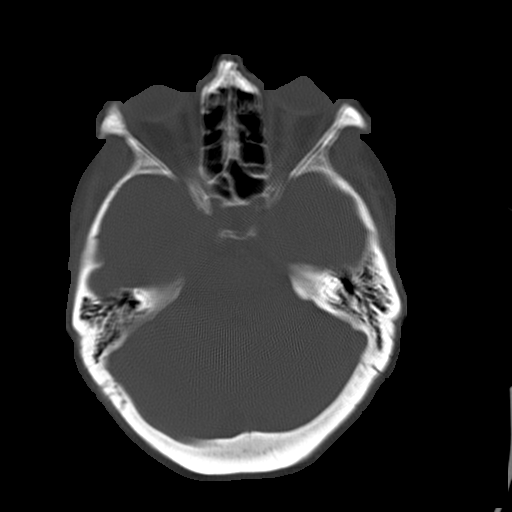
[im 13/30  bone]
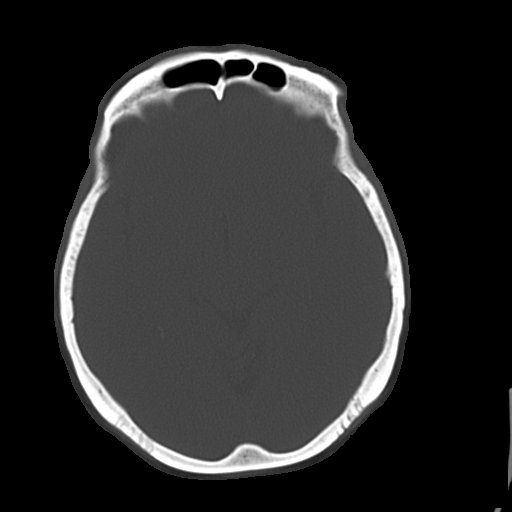
[im 17/30  bone]
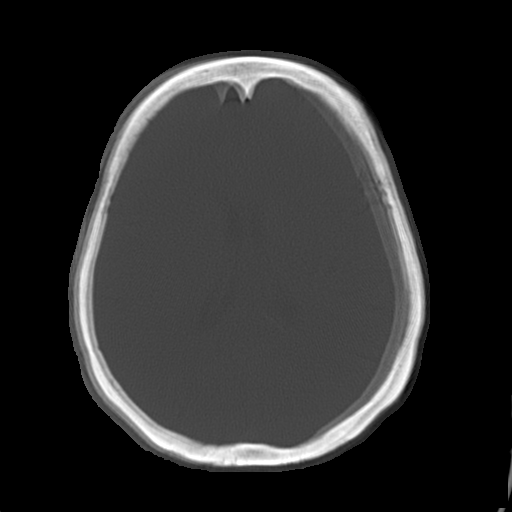
[im 21/30  bone]
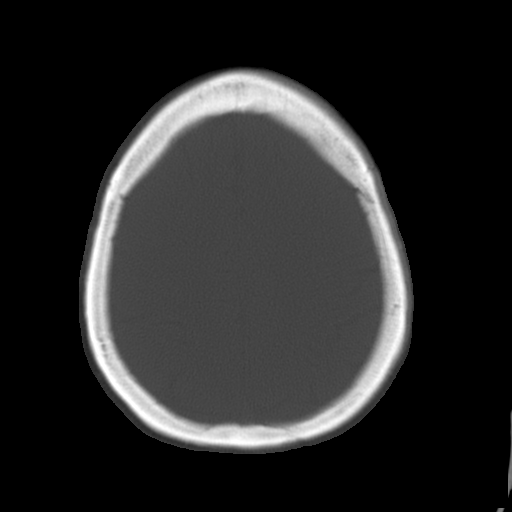
[im 25/30  bone]
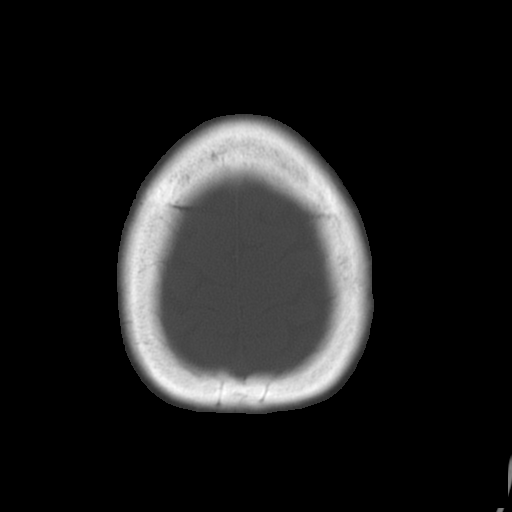

[17 of 30 positions shown; findings below may reference images not displayed]

PROCEDURE:     CT  - CT HEAD WITHOUT CONTRAST  - January 11, 2013  [DATE]

RESULT:     Noncontrast emergent CT of the brain is compared to the previous
exam dated 02 January, 2013.

There are old-appearing nasal bone fractures. No depressed skull fracture is
evident. The ventricles and sulci appear to be normal. There is some patient
motion artifact. There is no intracranial hemorrhage, mass, mass effect or
evolving infarct. The sinuses appear to show normal appearing aeration as do
the included mastoid air cells.
IMPRESSION: 1. No acute intracranial abnormality. Stable appearance.

[REDACTED]

## 2013-10-04 IMAGING — CR DG CHEST 1V PORT
1 series · 1 of 1 positions shown · non-contrast
Comparison: none

[ap]
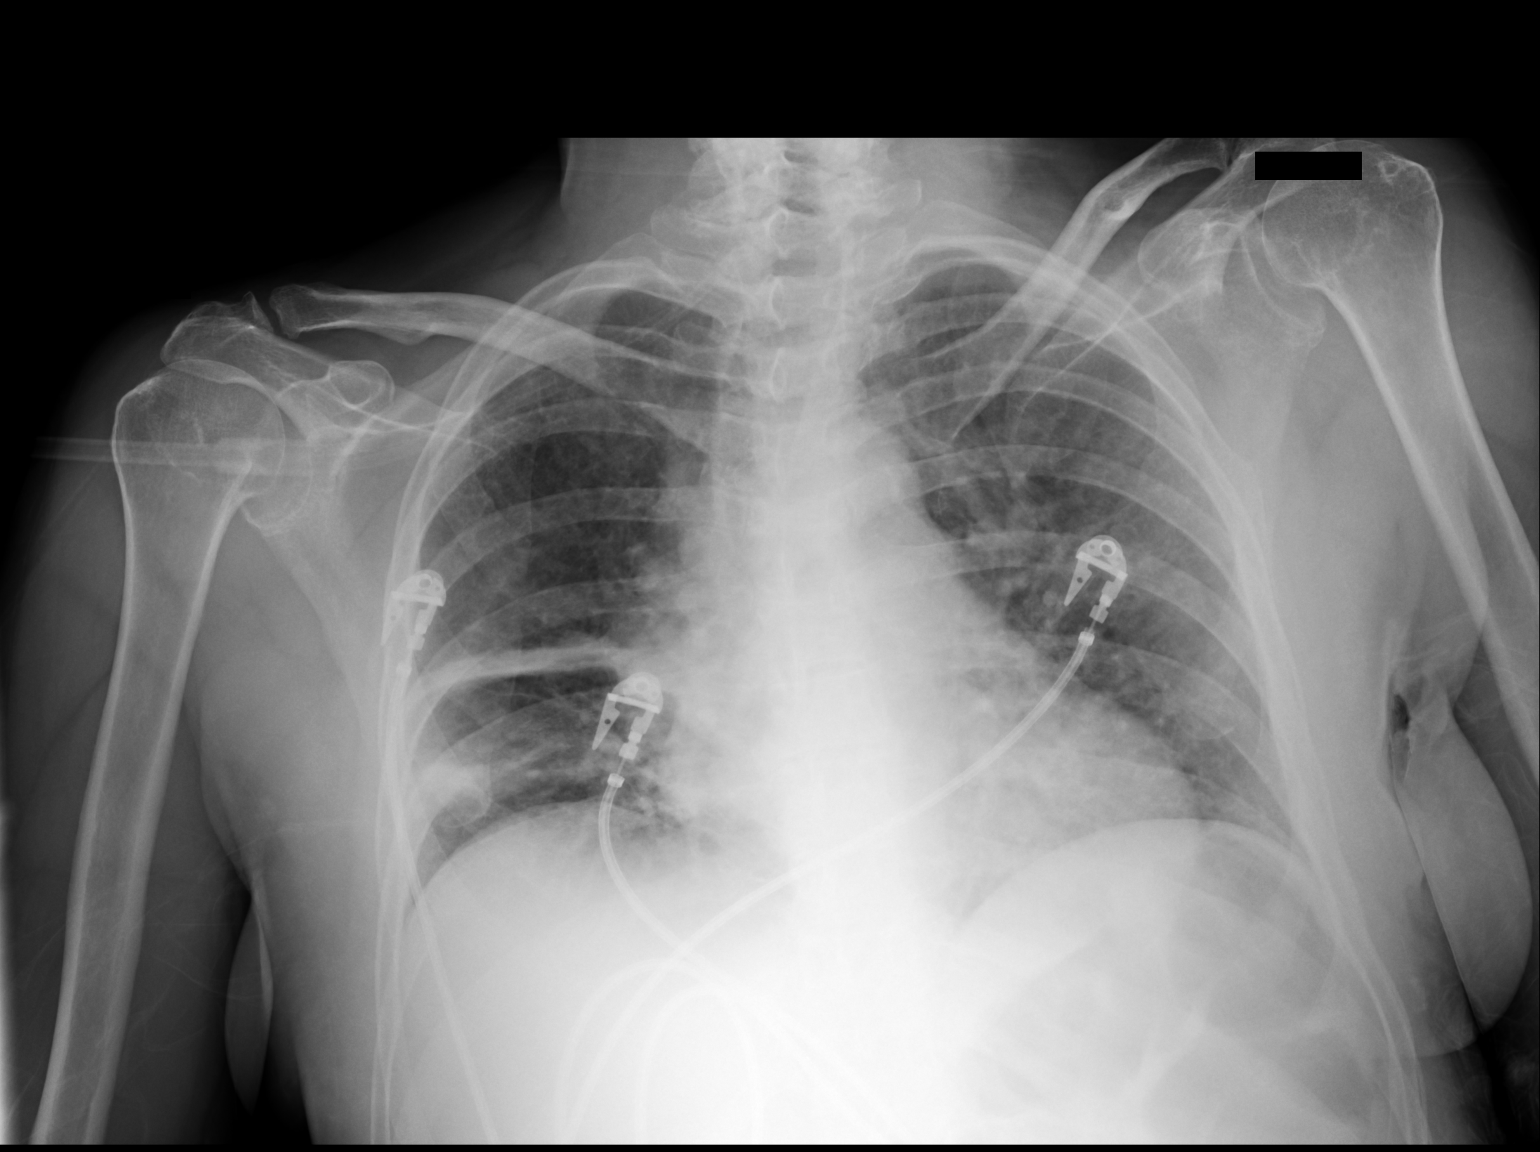

[1 of 1 positions shown; findings below may reference images not displayed]

Canned report from images found in remote index.

Refer to host system for actual result text.

## 2013-10-05 IMAGING — US ABDOMEN ULTRASOUND
1 series · 13 of 25 positions shown · non-contrast
Comparison: none

REASON FOR EXAM: elevated lfts, eval for hydronephrosis
COMMENTS:

[Series 1: abdomen ultrasound · 0.31mm/px · 13 of 98 slices shown]
[im 1/98]
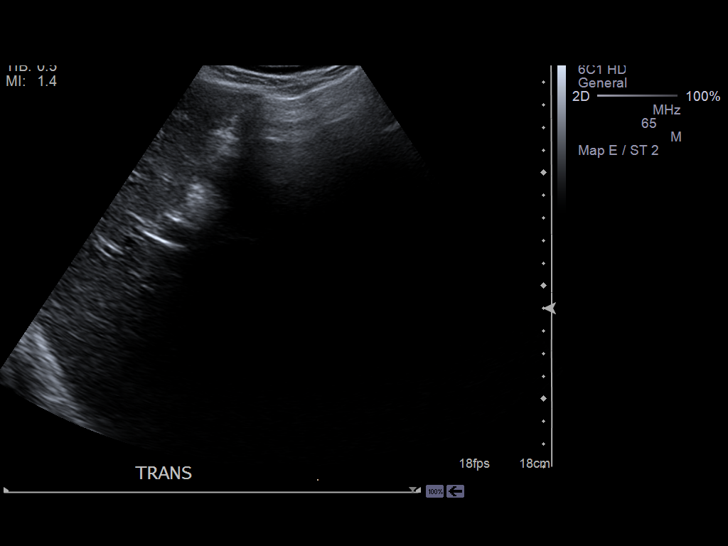
[im 9/98]
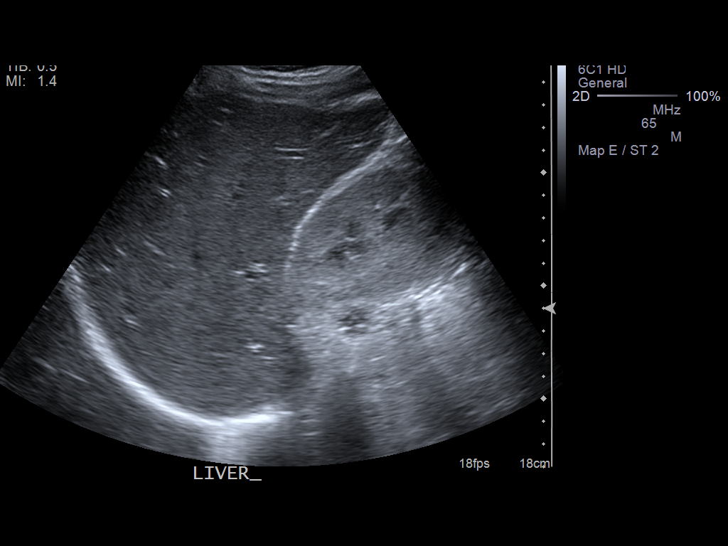
[im 17/98]
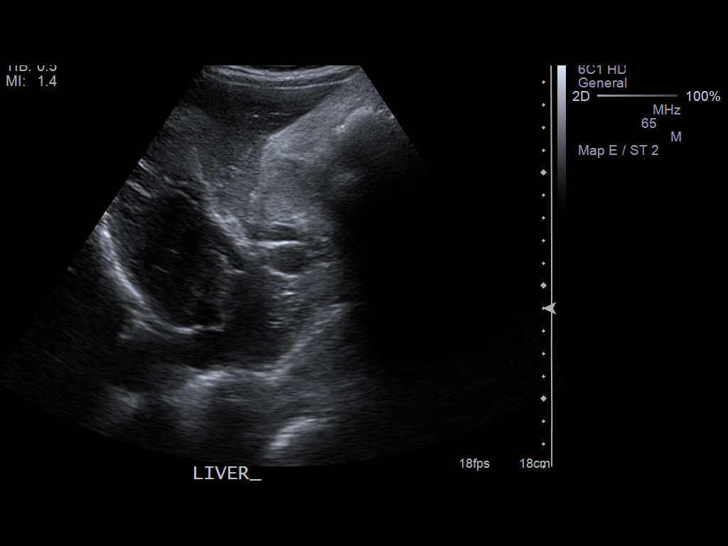
[im 25/98]
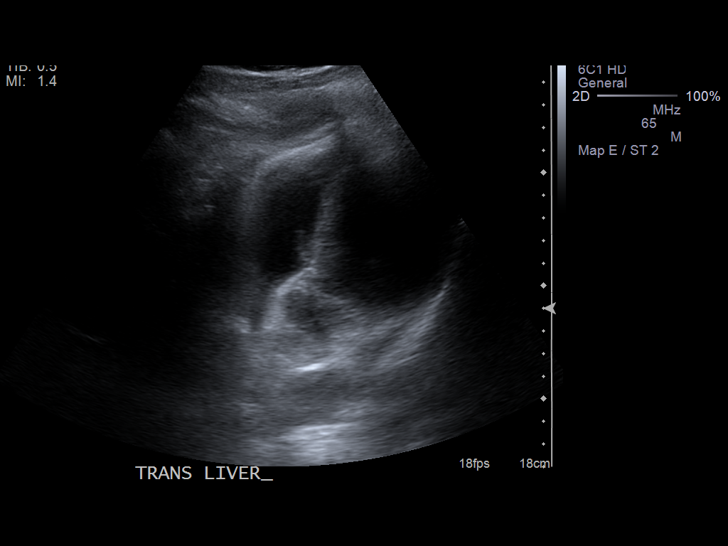
[im 33/98]
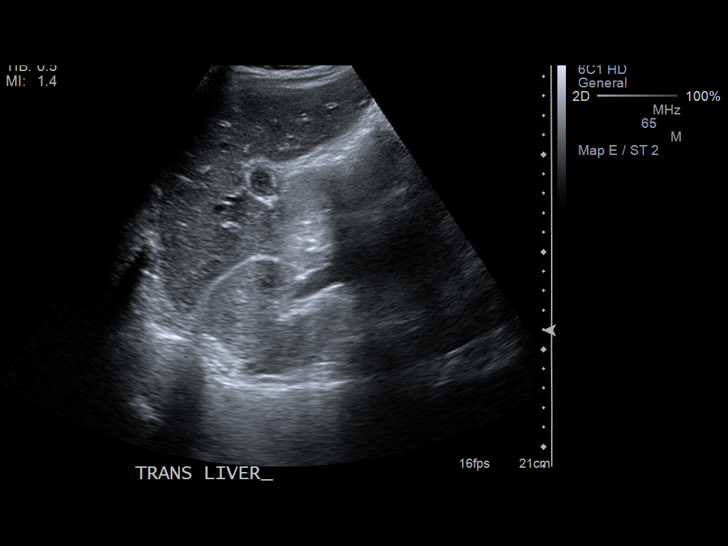
[im 41/98]
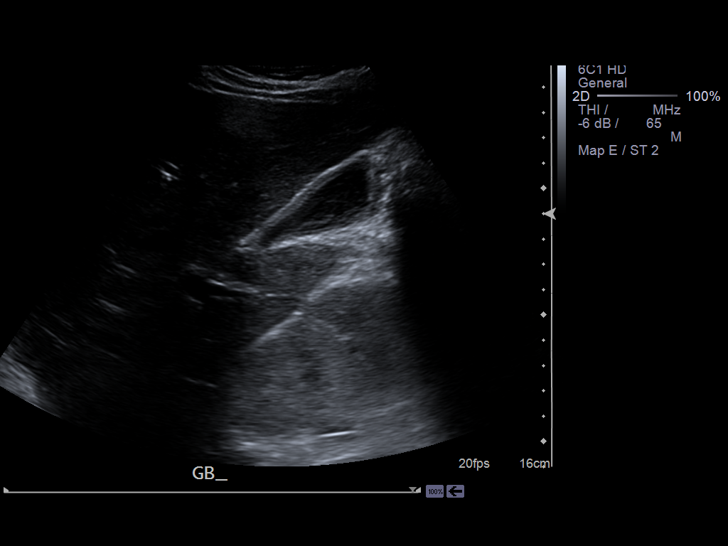
[im 49/98]
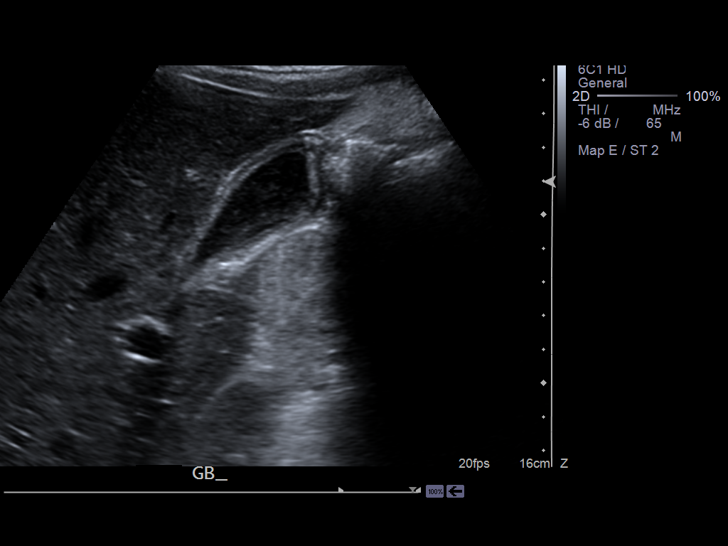
[im 57/98]
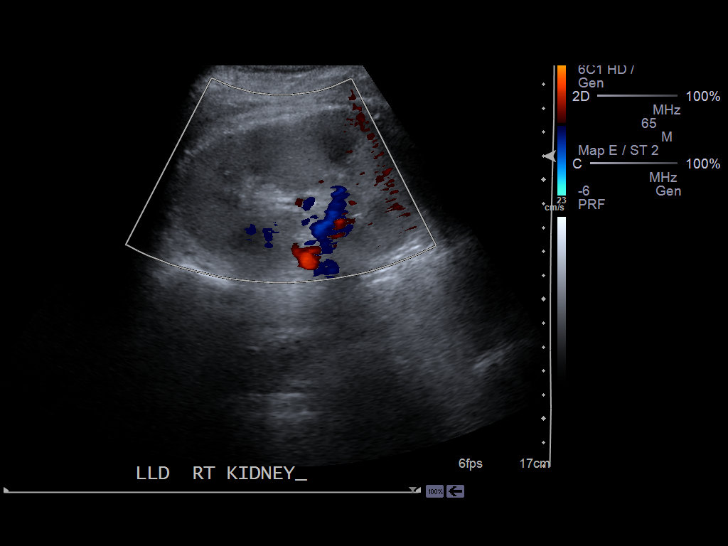
[im 65/98]
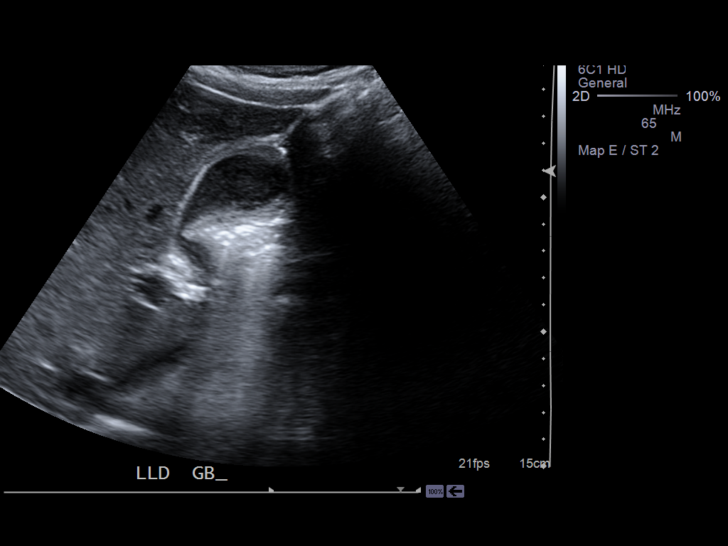
[im 73/98]
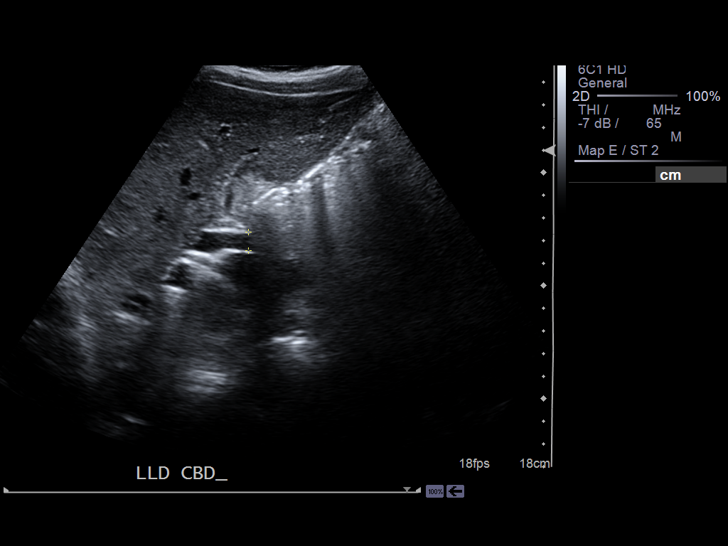
[im 81/98]
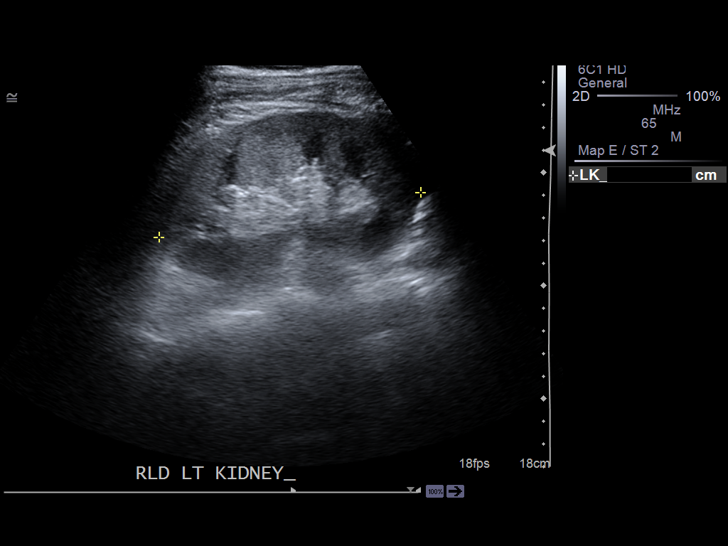
[im 89/98]
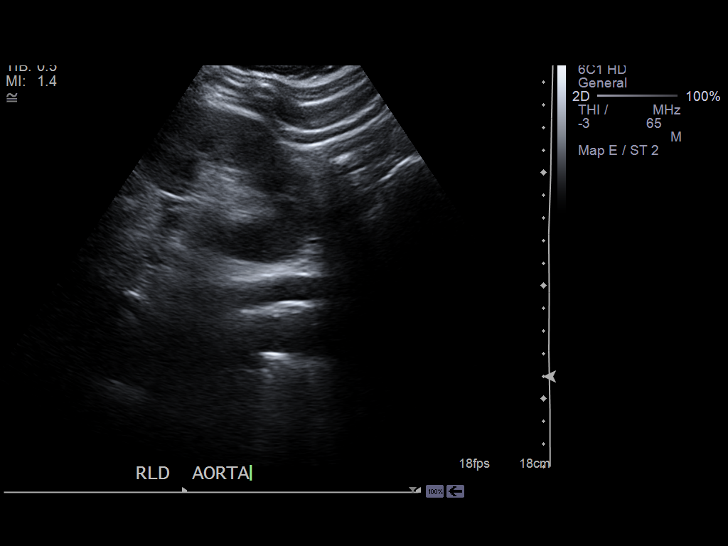
[im 98/98]
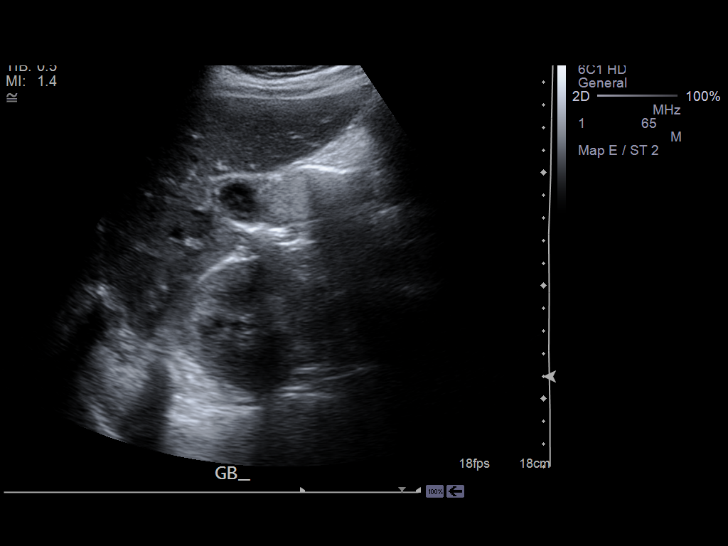

[13 of 25 positions shown; findings below may reference images not displayed]

PROCEDURE:     US  - US ABDOMEN GENERAL SURVEY  - January 12, 2013  [DATE]

RESULT:     The patient had difficulty remaining motionless for this study.

The liver demonstrates mildly decreased echotexture diffusely. It contains a
hyperechoic focus near the dome measuring 2 x 2 x 1.5 cm suggestive of a
hemangioma. There is a small amount of intrahepatic ductal dilation.

The gallbladder wall is thickened the 3.7 mm. No positive sonographic
Murphy's sign is reported. The common bile duct is mildly dilated at
millimeters.

The pancreas was obscured by bowel gas. The spleen is normal in size and
echotexture. The abdominal aorta and inferior vena cava are normal in
appearance. The right kidney measures 12.3 x 6 x 5.9 cm. The echotexture of
the renal cortex appears mildly increased. There is an upper pole cyst in
the right kidney measuring 1.8 x 1.4 x 1.8 cm. There is no hydronephrosis on
the right. The left kidney measures 12.1 x 6.2 x 6.7 cm and exhibits no
hydronephrosis. Its echotexture is also increased.
IMPRESSION: 1. Increased echotexture both kidneys is consistent with medical renal
disease. There is no hydronephrosis. There is a simple appearing upper pole
cyst on the right.
2. The liver exhibits mildly decreased echotexture diffusely. There is an
approximately 2 cm diameter hyperechoic focus near the dome which may
reflect a hemangioma.
3. The gallbladder exhibits wall thickening and sludge. There is no
pericholecystic fluid.
4. The common bile duct and the intrahepatic ducts demonstrate mild dilation.
5. The spleen is not enlarged.

[REDACTED]

## 2013-10-08 IMAGING — US US EXTREM LOW VENOUS*R*
1 series · 14 of 20 positions shown · non-contrast
Comparison: none

REASON FOR EXAM: right lower leg swelling
COMMENTS:

[Series 1: us extrem low venous*right* · 0.09mm/px · 14 of 20 slices shown]
[im 1/20]
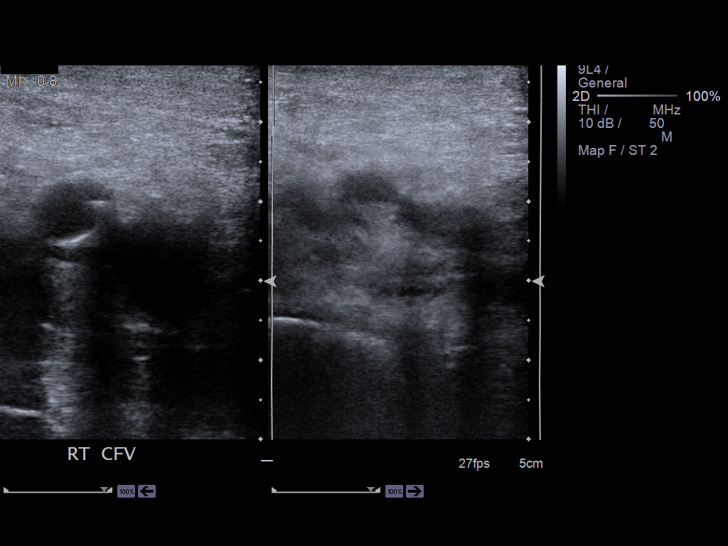
[im 3/20]
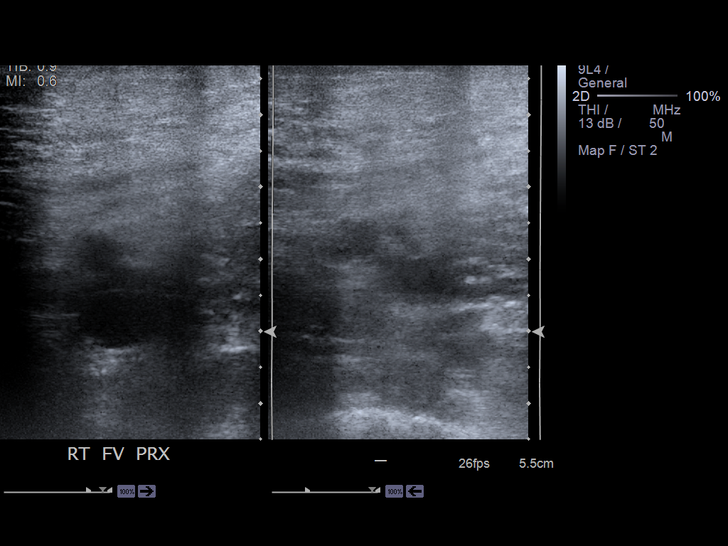
[im 4/20]
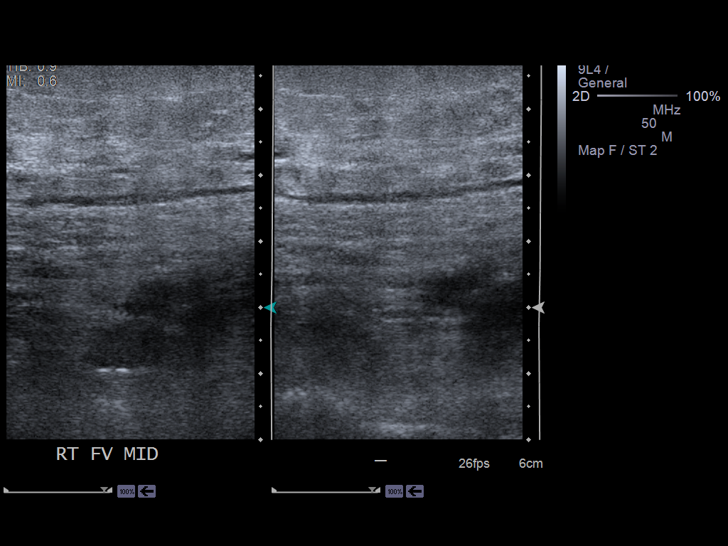
[im 6/20]
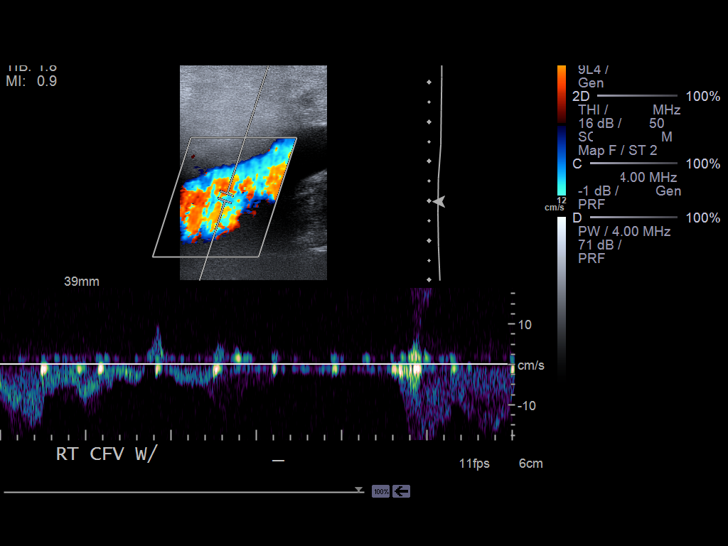
[im 7/20]
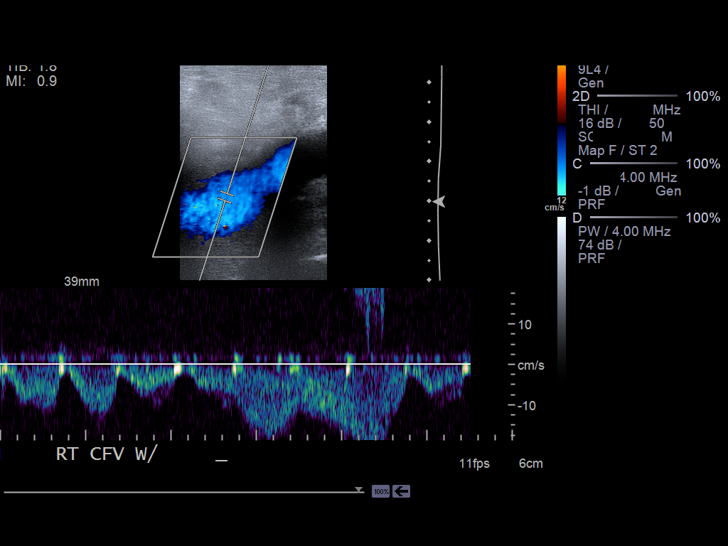
[im 8/20]
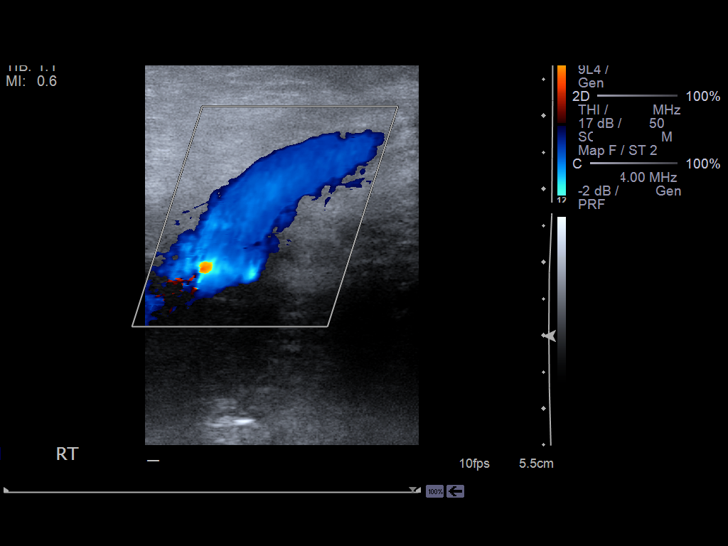
[im 10/20]
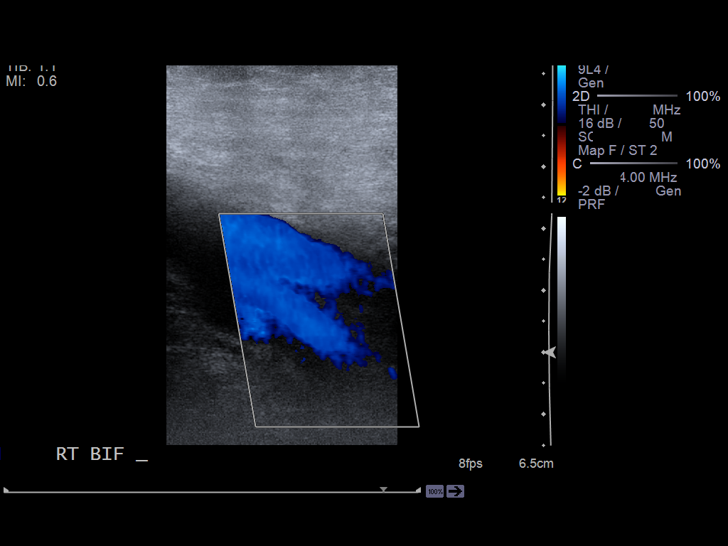
[im 11/20]
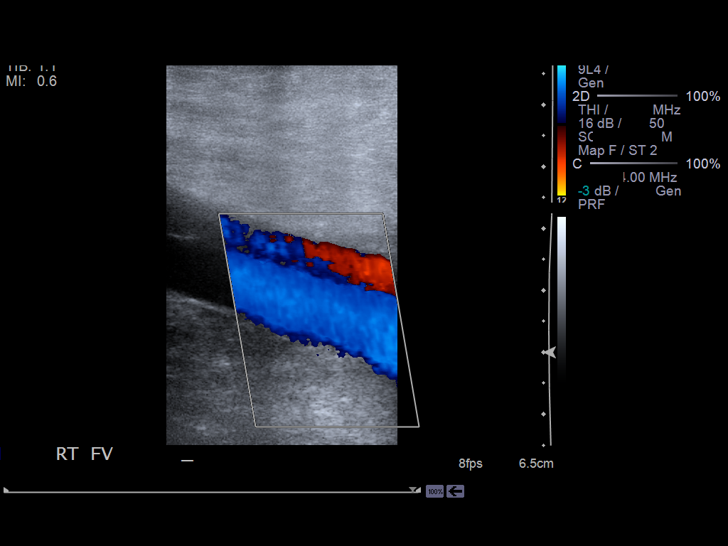
[im 13/20]
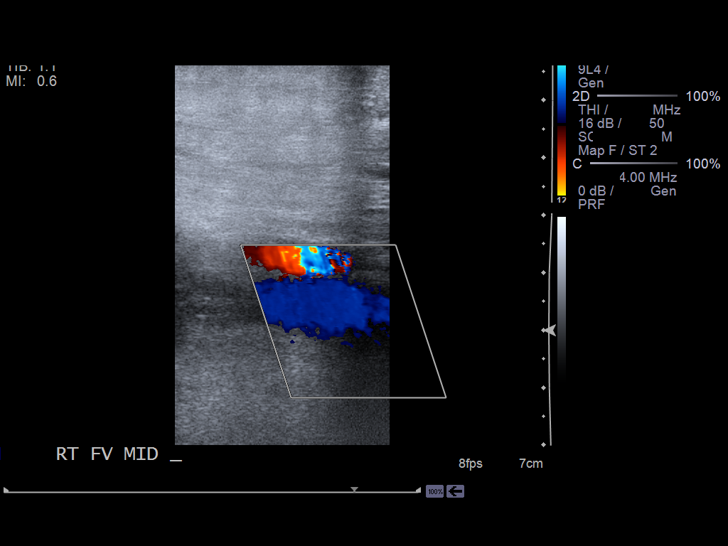
[im 14/20]
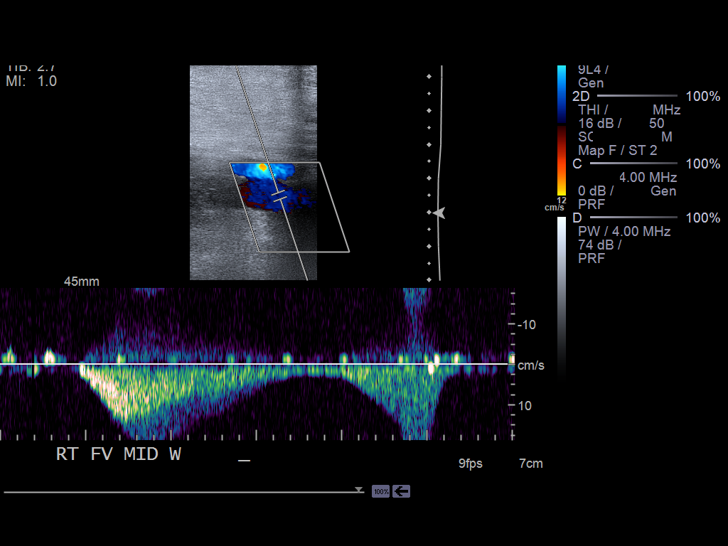
[im 16/20]
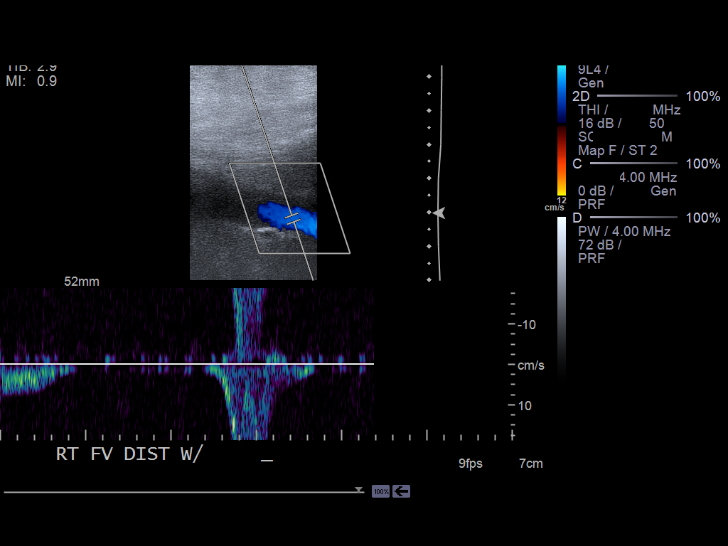
[im 17/20]
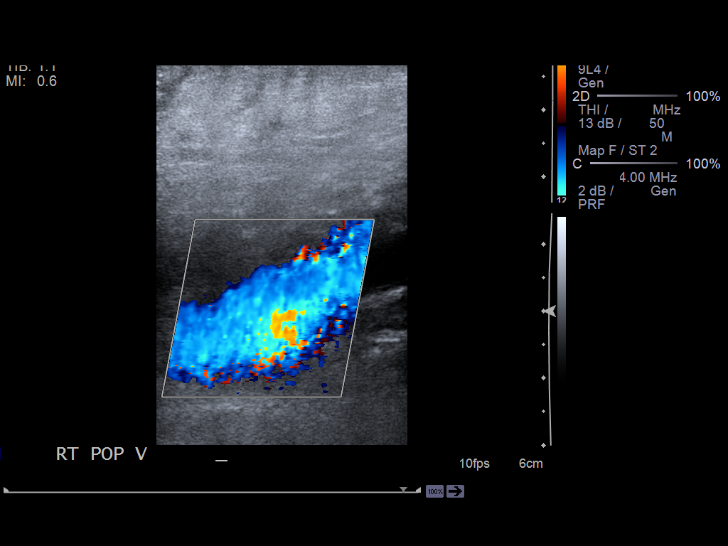
[im 18/20]
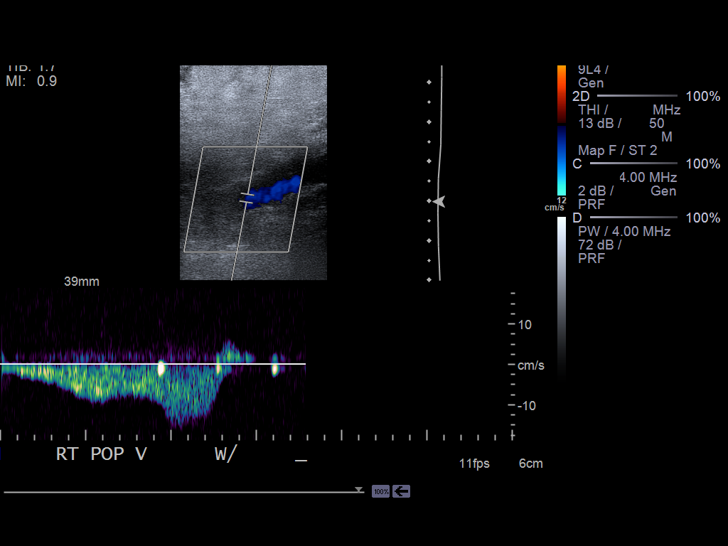
[im 20/20]
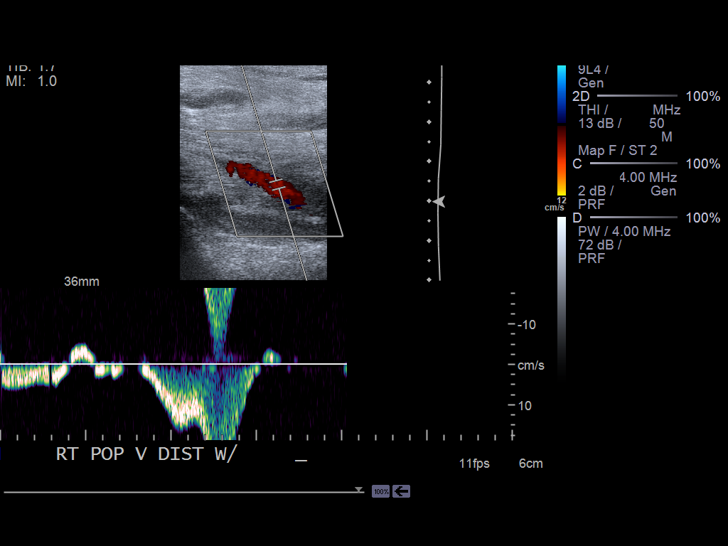

[14 of 20 positions shown; findings below may reference images not displayed]

PROCEDURE:     US  - US DOPPLER LOW EXTR RIGHT  - January 15, 2013  [DATE]

RESULT:     Grayscale and color flow Doppler techniques were employed to
evaluate the deep veins of the right lower extremity.

The right common femoral, superficial femoral, and popliteal veins are
normally compressible. The waveform patterns are normal and the color flow
images are normal. The response to the augmentation and Valsalva maneuvers
is normal.
IMPRESSION: There is no evidence of thrombus within the right femoral
or popliteal veins.

[REDACTED]

## 2013-10-24 ENCOUNTER — Emergency Department: Payer: Self-pay | Admitting: Emergency Medicine

## 2013-11-19 ENCOUNTER — Emergency Department: Payer: Self-pay | Admitting: Emergency Medicine

## 2013-12-14 IMAGING — CR DG ABDOMEN 3V
1 series · 5 of 5 positions shown · non-contrast
Comparison: none

REASON FOR EXAM: Pain
COMMENTS:   May transport without cardiac monitor

[Series 1: w chest pa · 0.14mm/px · 5 of 5 slices shown]
[im 1/5]
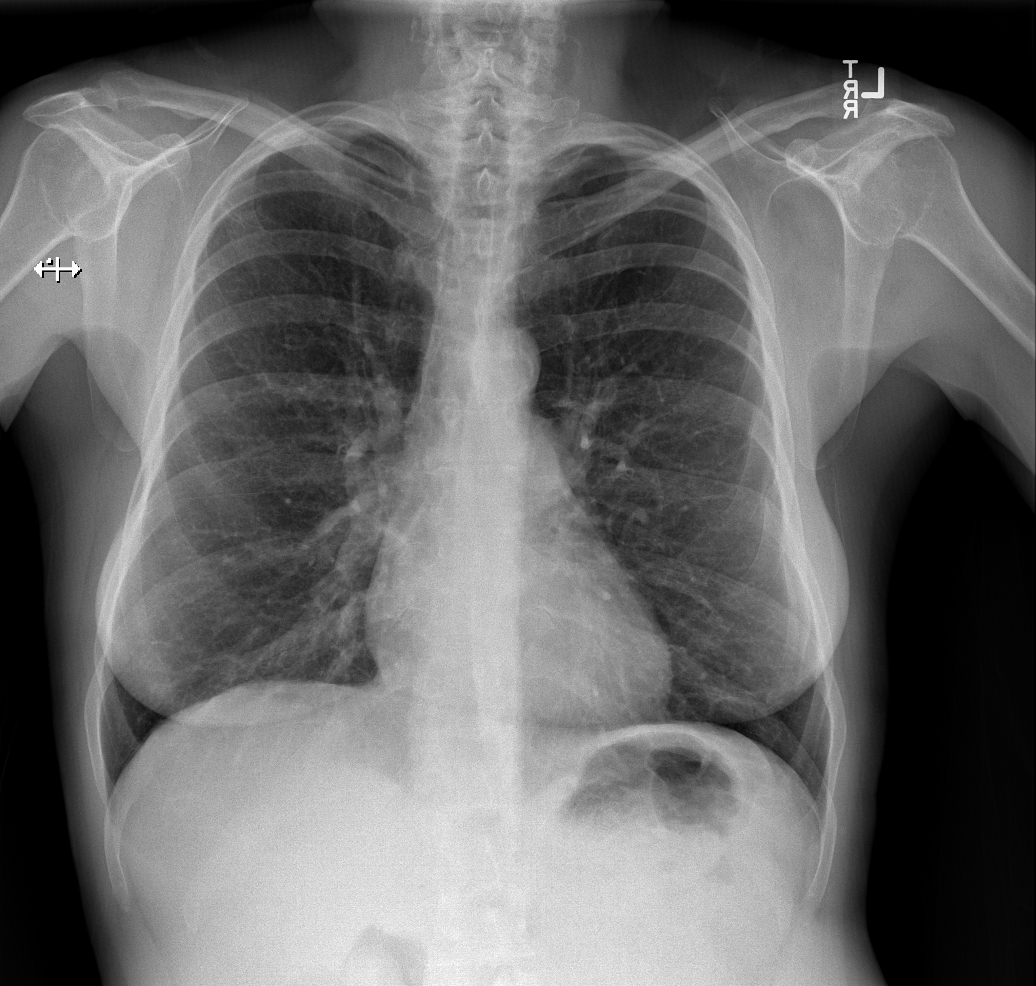
[im 2/5]
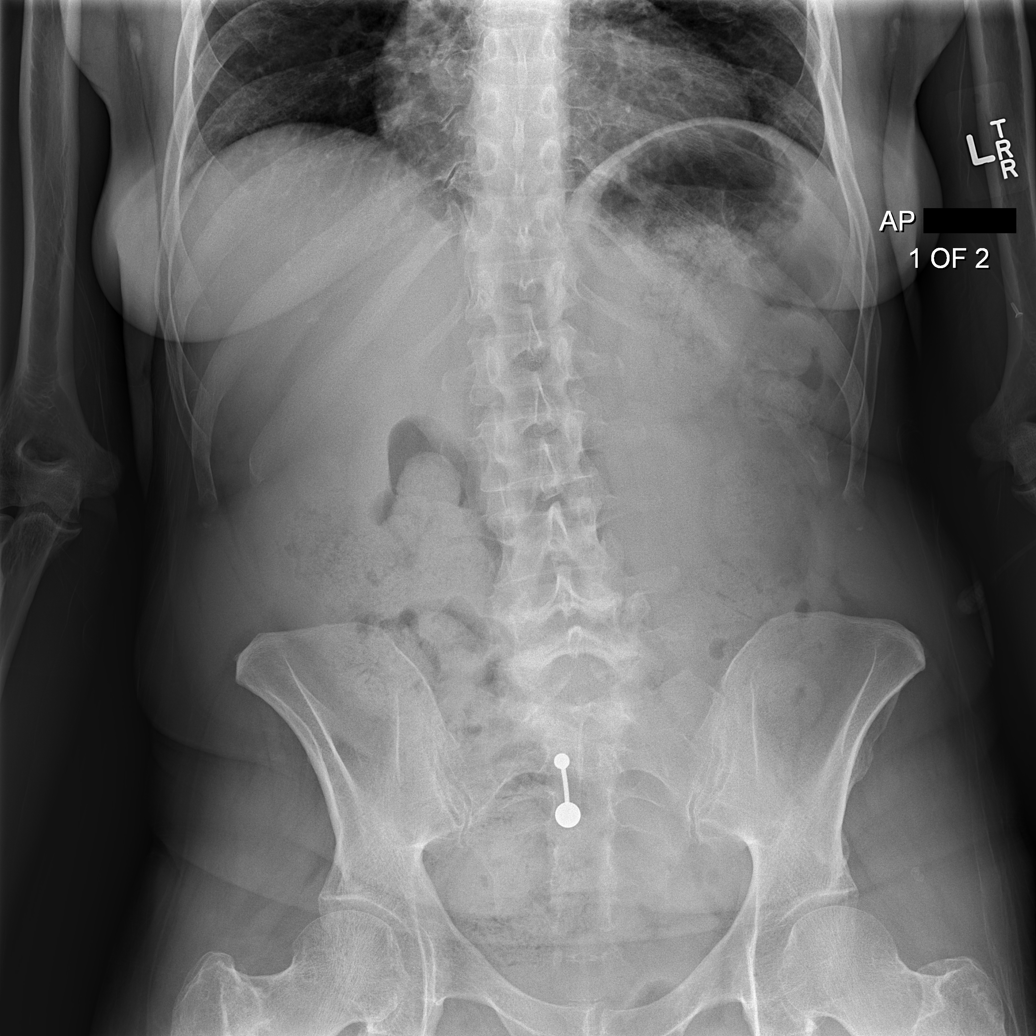
[im 3/5]
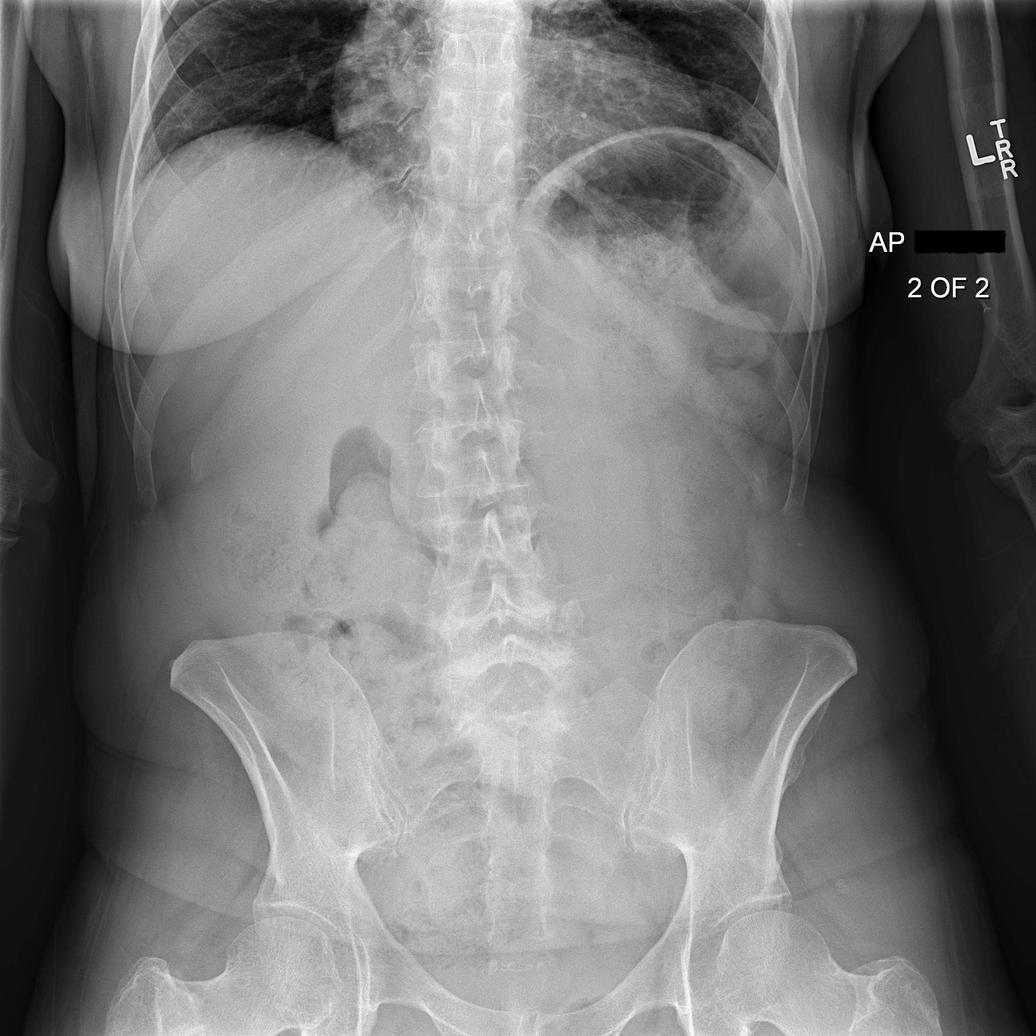
[im 4/5]
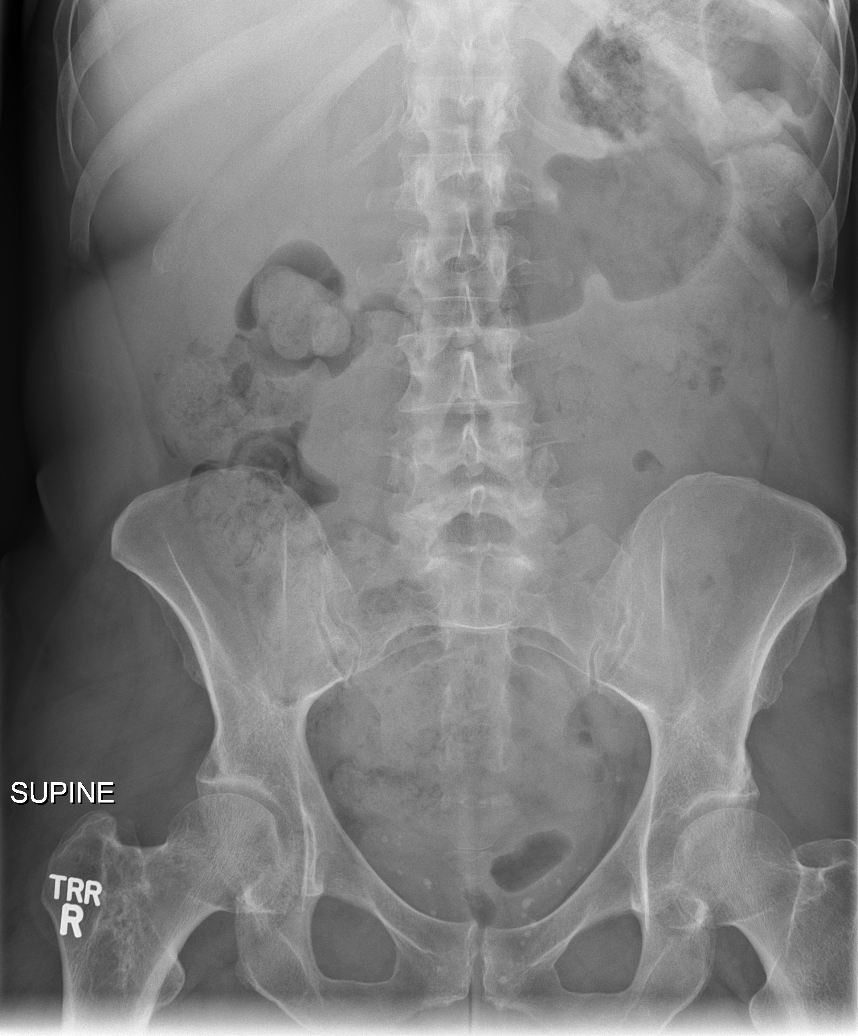
[im 5/5]
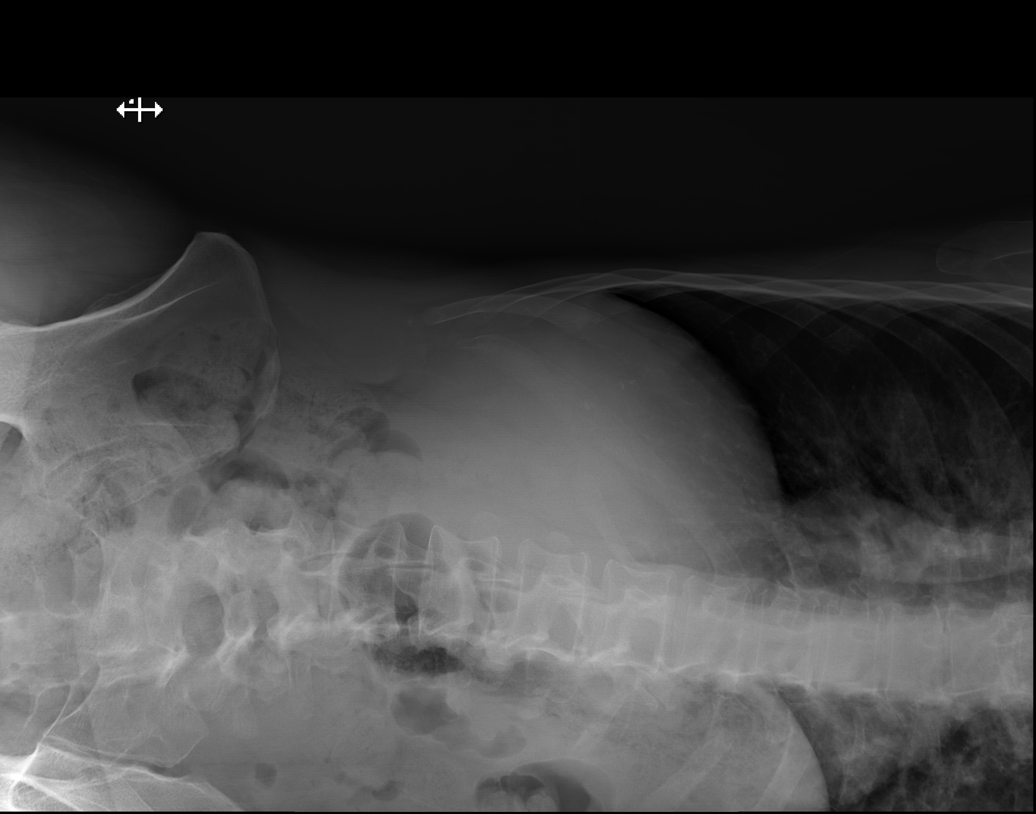

[5 of 5 positions shown; findings below may reference images not displayed]

PROCEDURE:     DXR - DXR ABDOMEN 3-WAY (INCL PA CXR)  - March 23, 2013  [DATE]

RESULT:     The lungs are mildly hyperinflated and clear. The cardiac
silhouette is normal in size. The mediastinum is normal in width. Within the
abdomen the bowel gas pattern may reflect constipation. There is no evidence
of ileus nor of obstruction. A few phleboliths are demonstrated within the
pelvis. No stones are evident over either kidney. The bony structures are
normal in appearance.
IMPRESSION: 1. The bowel gas pattern suggests constipation.
2. There is no evidence of acute cardiopulmonary abnormality.

[REDACTED]

## 2014-01-20 ENCOUNTER — Emergency Department: Payer: Self-pay | Admitting: Emergency Medicine

## 2014-01-20 LAB — URINALYSIS, COMPLETE
Bilirubin,UR: NEGATIVE
Glucose,UR: NEGATIVE mg/dL (ref 0–75)
Ketone: NEGATIVE
LEUKOCYTE ESTERASE: NEGATIVE
NITRITE: NEGATIVE
PH: 5 (ref 4.5–8.0)
Protein: NEGATIVE
RBC,UR: 6 /HPF (ref 0–5)
Specific Gravity: 1.017 (ref 1.003–1.030)
WBC UR: 3 /HPF (ref 0–5)

## 2014-01-20 LAB — COMPREHENSIVE METABOLIC PANEL
ALT: 37 U/L (ref 12–78)
AST: 52 U/L — AB (ref 15–37)
Albumin: 3.8 g/dL (ref 3.4–5.0)
Alkaline Phosphatase: 107 U/L
Anion Gap: 8 (ref 7–16)
BUN: 29 mg/dL — ABNORMAL HIGH (ref 7–18)
Bilirubin,Total: 0.3 mg/dL (ref 0.2–1.0)
CALCIUM: 10 mg/dL (ref 8.5–10.1)
CO2: 26 mmol/L (ref 21–32)
Chloride: 103 mmol/L (ref 98–107)
Creatinine: 1.18 mg/dL (ref 0.60–1.30)
EGFR (African American): >60
EGFR (Non-African Amer.): 52 — ABNORMAL LOW
GLUCOSE: 133 mg/dL — AB (ref 65–99)
Osmolality: 282 (ref 275–301)
Potassium: 4.4 mmol/L (ref 3.5–5.1)
SODIUM: 137 mmol/L (ref 136–145)
TOTAL PROTEIN: 7.4 g/dL (ref 6.4–8.2)

## 2014-01-20 LAB — CBC
HCT: 41.4 % (ref 35.0–47.0)
HGB: 13.9 g/dL (ref 12.0–16.0)
MCH: 30.8 pg (ref 26.0–34.0)
MCHC: 33.5 g/dL (ref 32.0–36.0)
MCV: 92 fL (ref 80–100)
Platelet: 340 10*3/uL (ref 150–440)
RBC: 4.51 10*6/uL (ref 3.80–5.20)
RDW: 13.5 % (ref 11.5–14.5)
WBC: 11 10*3/uL (ref 3.6–11.0)

## 2014-01-29 ENCOUNTER — Other Ambulatory Visit: Payer: Self-pay | Admitting: Family Medicine

## 2014-01-29 IMAGING — MR MRI OF THE LEFT SHOULDER WITHOUT CONTRAST
6 series · 40 of 40 positions shown · non-contrast
Comparison: none

REASON FOR EXAM: proximal biceps pending rupture
COMMENTS:

PROCEDURE:     MR  - MR SHOULDER LT  WO CONTRAST  - May 08, 2013 [DATE]
RESULT:     History: Biceps rupture.
Comparison Study: No prior.

[Series 3: T2 fat-sat · axial · 4.0mm · 0.62mm/px · z∈[-18,+125]mm · 8 of 22 slices shown]
[im 1/22]
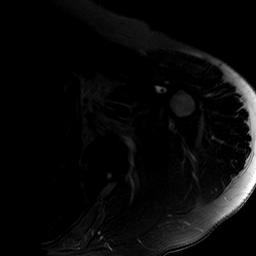
[im 4/22]
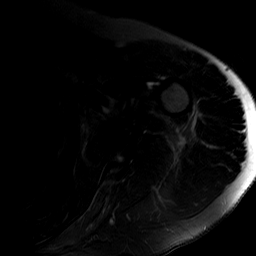
[im 7/22]
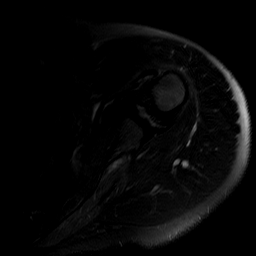
[im 10/22]
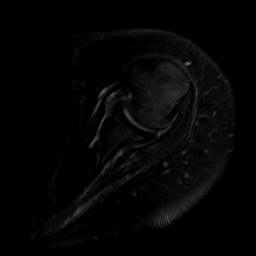
[im 13/22]
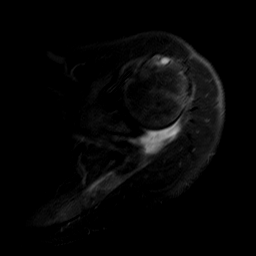
[im 16/22]
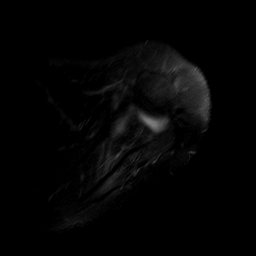
[im 19/22]
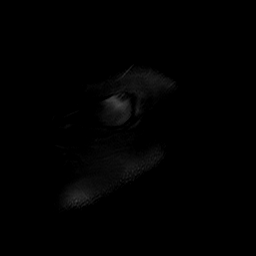
[im 22/22]
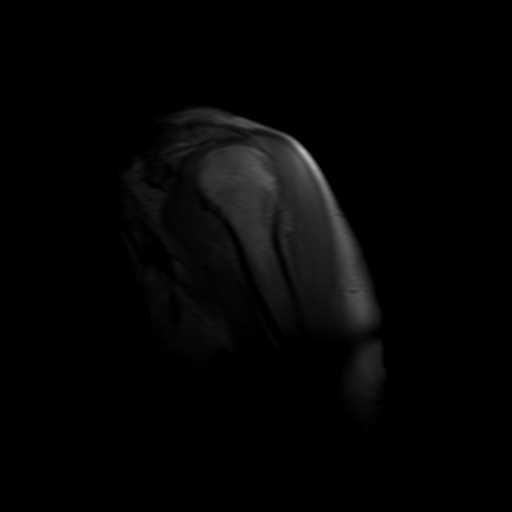

[Series 5: pd_ax fatsat · axial · 4.0mm · 0.62mm/px · z∈[-18,+125]mm · 8 of 22 slices shown]
[im 1/22]
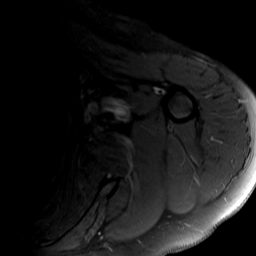
[im 4/22]
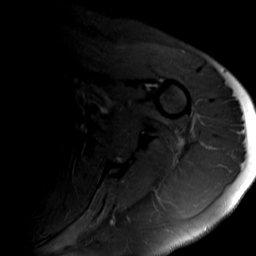
[im 7/22]
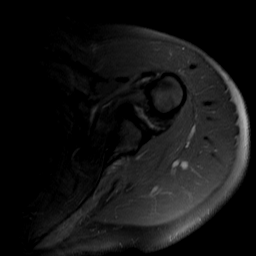
[im 10/22]
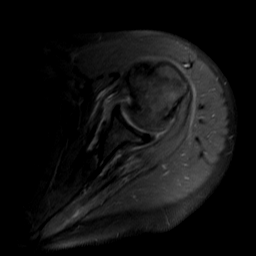
[im 13/22]
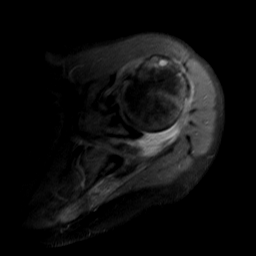
[im 16/22]
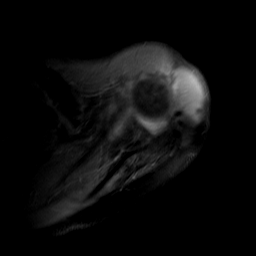
[im 19/22]
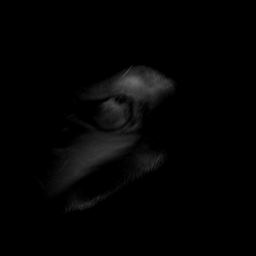
[im 22/22]
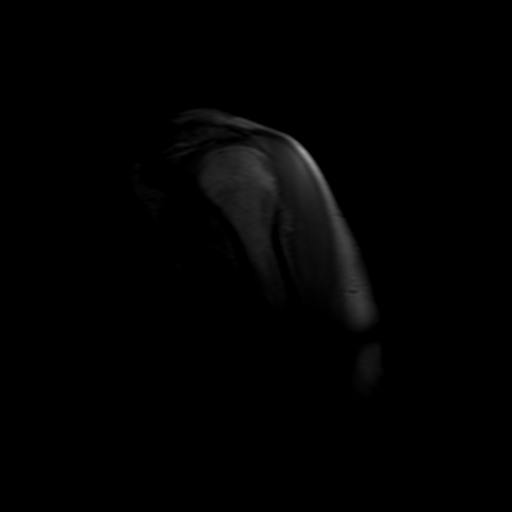

[Series 10: PD fat-sat · axial · 4.0mm · 0.62mm/px · z∈[+26,+106]mm · 6 of 20 slices shown]
[im 1/20]
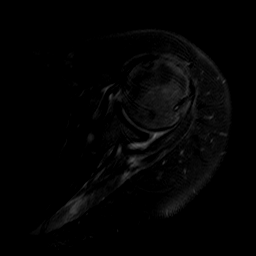
[im 4/20]
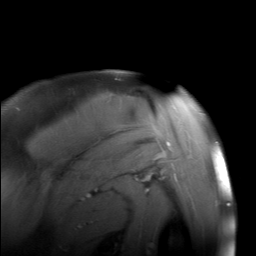
[im 8/20]
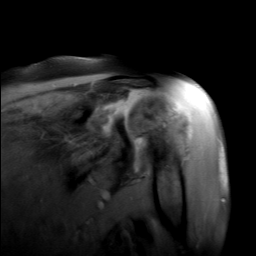
[im 12/20]
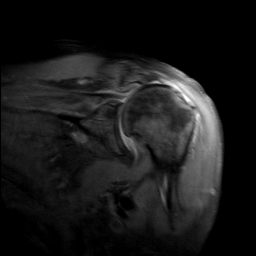
[im 16/20]
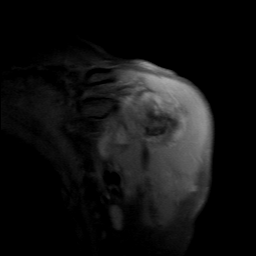
[im 20/20]
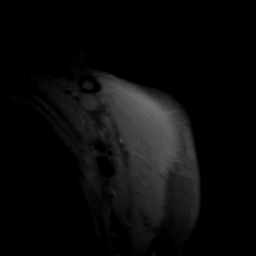

[Series 15: t1_sag · axial · 4.0mm · 0.62mm/px · z∈[+26,+106]mm · 6 of 20 slices shown]
[im 1/20]
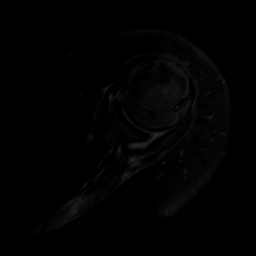
[im 4/20]
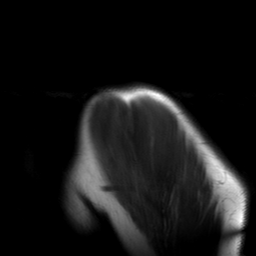
[im 8/20]
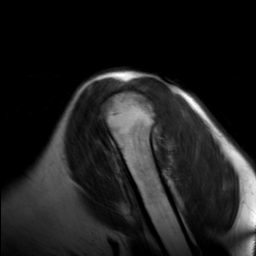
[im 12/20]
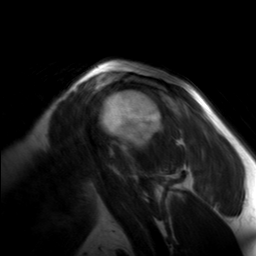
[im 16/20]
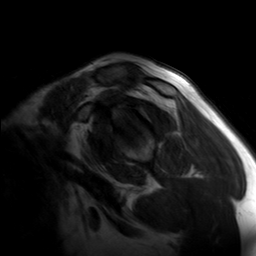
[im 20/20]
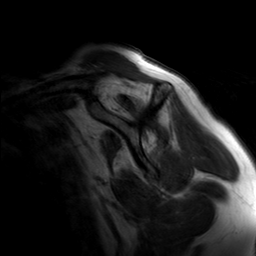

[Series 5003: T2 · axial · 4.0mm · 0.62mm/px · z∈[+26,+106]mm · 6 of 20 slices shown (1 of 2)]
[im 1/20]
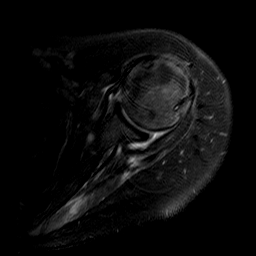
[im 4/20]
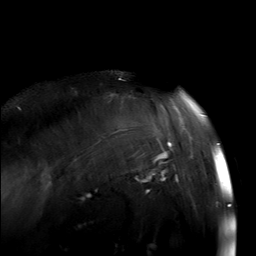
[im 8/20]
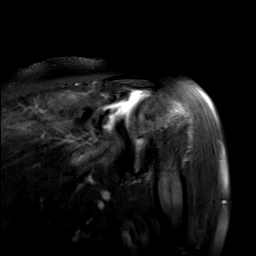
[im 12/20]
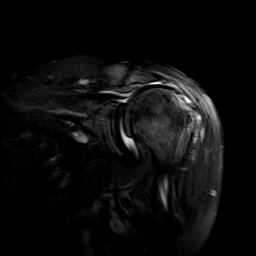
[im 16/20]
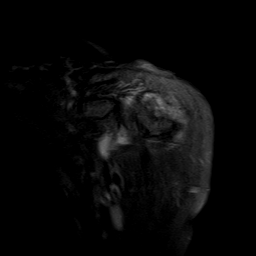
[im 20/20]
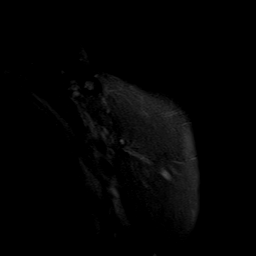

[Series 5006: T2 · axial · 4.0mm · 0.62mm/px · z∈[+26,+106]mm · 6 of 20 slices shown (2 of 2)]
[im 1/20]
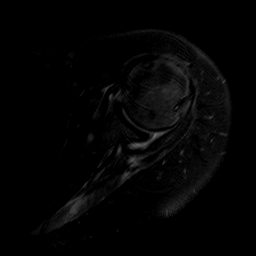
[im 4/20]
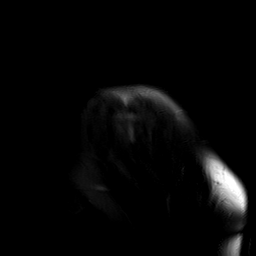
[im 8/20]
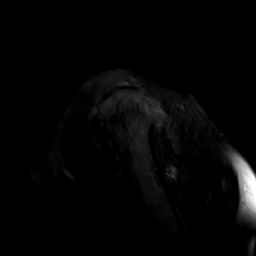
[im 12/20]
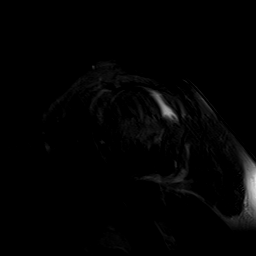
[im 16/20]
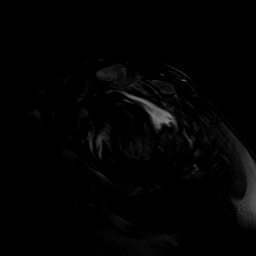
[im 20/20]
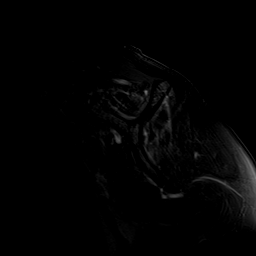

[40 of 40 positions shown; findings below may reference images not displayed]

FINDINGS: Multiplanar, multisequence imaging obtained. Acromio clavicular
degenerative changes with subacromial spurring is present. The shoulder is
high riding. There is a complete retracted tear of the supraspinatus tendon.
A tear of the infraspinatus tendon appears present, the teres minor is
intact. Partial tear the subscapularis tendon appears present; the long head
biceps tendons intact. Humeral head intact. Motion artifact is present
making evaluation glenoid labrum difficult. Anterior/superior glenoid labral
subtle tear cannot be excluded.
IMPRESSION: 1. High riding shoulder with complete retracted tear supraspinatus tendon.
Given the position of the humeral head this is most likely a chronic
condition. Partial tears of the infraspinatus and subscapularis cannot be
excluded. Partial tear the long head biceps tendon cannot be excluded.
2. Anterior superior glenoid labral tear cannot be excluded.

## 2014-02-14 ENCOUNTER — Emergency Department: Payer: Self-pay | Admitting: Internal Medicine

## 2014-02-14 LAB — URINALYSIS, COMPLETE
Bacteria: NONE SEEN
Bilirubin,UR: NEGATIVE
Blood: NEGATIVE
Glucose,UR: NEGATIVE mg/dL (ref 0–75)
Ketone: NEGATIVE
LEUKOCYTE ESTERASE: NEGATIVE
Nitrite: NEGATIVE
PROTEIN: NEGATIVE
Ph: 5 (ref 4.5–8.0)
RBC,UR: 1 /HPF (ref 0–5)
Specific Gravity: 1.021 (ref 1.003–1.030)
Squamous Epithelial: 3

## 2014-02-14 LAB — DRUG SCREEN, URINE
Amphetamines, Ur Screen: NEGATIVE (ref ?–1000)
BENZODIAZEPINE, UR SCRN: POSITIVE (ref ?–200)
Barbiturates, Ur Screen: NEGATIVE (ref ?–200)
Cannabinoid 50 Ng, Ur ~~LOC~~: NEGATIVE (ref ?–50)
Cocaine Metabolite,Ur ~~LOC~~: NEGATIVE (ref ?–300)
MDMA (Ecstasy)Ur Screen: NEGATIVE (ref ?–500)
METHADONE, UR SCREEN: NEGATIVE (ref ?–300)
Opiate, Ur Screen: NEGATIVE (ref ?–300)
PHENCYCLIDINE (PCP) UR S: NEGATIVE (ref ?–25)
Tricyclic, Ur Screen: POSITIVE (ref ?–1000)

## 2014-02-14 LAB — CBC
HCT: 43 % (ref 35.0–47.0)
HGB: 13.9 g/dL (ref 12.0–16.0)
MCH: 29.5 pg (ref 26.0–34.0)
MCHC: 32.3 g/dL (ref 32.0–36.0)
MCV: 92 fL (ref 80–100)
PLATELETS: 310 10*3/uL (ref 150–440)
RBC: 4.7 10*6/uL (ref 3.80–5.20)
RDW: 13.8 % (ref 11.5–14.5)
WBC: 10 10*3/uL (ref 3.6–11.0)

## 2014-02-14 LAB — COMPREHENSIVE METABOLIC PANEL
ALK PHOS: 99 U/L
Albumin: 3.4 g/dL (ref 3.4–5.0)
Anion Gap: 9 (ref 7–16)
BILIRUBIN TOTAL: 0.2 mg/dL (ref 0.2–1.0)
BUN: 14 mg/dL (ref 7–18)
Calcium, Total: 9.7 mg/dL (ref 8.5–10.1)
Chloride: 108 mmol/L — ABNORMAL HIGH (ref 98–107)
Co2: 23 mmol/L (ref 21–32)
Creatinine: 1.12 mg/dL (ref 0.60–1.30)
EGFR (Non-African Amer.): 56 — ABNORMAL LOW
Glucose: 125 mg/dL — ABNORMAL HIGH (ref 65–99)
OSMOLALITY: 281 (ref 275–301)
POTASSIUM: 3.7 mmol/L (ref 3.5–5.1)
SGOT(AST): 13 U/L — ABNORMAL LOW (ref 15–37)
SGPT (ALT): 21 U/L (ref 12–78)
Sodium: 140 mmol/L (ref 136–145)
Total Protein: 7.1 g/dL (ref 6.4–8.2)

## 2014-02-14 LAB — ETHANOL
Ethanol %: <0.003 % (ref 0.000–0.080)
Ethanol: <3 mg/dL

## 2014-04-30 ENCOUNTER — Emergency Department: Payer: Self-pay | Admitting: Emergency Medicine

## 2014-06-13 ENCOUNTER — Emergency Department: Payer: Self-pay | Admitting: Emergency Medicine

## 2014-06-13 LAB — CBC WITH DIFFERENTIAL/PLATELET
Basophil #: 0.1 10*3/uL (ref 0.0–0.1)
Basophil %: 1 %
Eosinophil #: 0.3 10*3/uL (ref 0.0–0.7)
Eosinophil %: 2.3 %
HCT: 39.5 % (ref 35.0–47.0)
HGB: 12.8 g/dL (ref 12.0–16.0)
Lymphocyte #: 2.5 10*3/uL (ref 1.0–3.6)
Lymphocyte %: 20.4 %
MCH: 29.4 pg (ref 26.0–34.0)
MCHC: 32.4 g/dL (ref 32.0–36.0)
MCV: 91 fL (ref 80–100)
Monocyte #: 0.8 x10 3/mm (ref 0.2–0.9)
Monocyte %: 6.4 %
Neutrophil #: 8.7 10*3/uL — ABNORMAL HIGH (ref 1.4–6.5)
Neutrophil %: 69.9 %
Platelet: 335 10*3/uL (ref 150–440)
RBC: 4.35 10*6/uL (ref 3.80–5.20)
RDW: 14.4 % (ref 11.5–14.5)
WBC: 12.5 10*3/uL — ABNORMAL HIGH (ref 3.6–11.0)

## 2014-06-13 LAB — COMPREHENSIVE METABOLIC PANEL
ANION GAP: 5 — AB (ref 7–16)
AST: 24 U/L (ref 15–37)
Albumin: 3.2 g/dL — ABNORMAL LOW (ref 3.4–5.0)
Alkaline Phosphatase: 132 U/L — ABNORMAL HIGH
BUN: 8 mg/dL (ref 7–18)
Bilirubin,Total: 0.3 mg/dL (ref 0.2–1.0)
CREATININE: 0.73 mg/dL (ref 0.60–1.30)
Calcium, Total: 9.5 mg/dL (ref 8.5–10.1)
Chloride: 110 mmol/L — ABNORMAL HIGH (ref 98–107)
Co2: 27 mmol/L (ref 21–32)
Glucose: 114 mg/dL — ABNORMAL HIGH (ref 65–99)
Osmolality: 282 (ref 275–301)
Potassium: 4.2 mmol/L (ref 3.5–5.1)
SGPT (ALT): 29 U/L
SODIUM: 142 mmol/L (ref 136–145)
Total Protein: 7.7 g/dL (ref 6.4–8.2)

## 2014-07-17 IMAGING — CR RIGHT HIP - COMPLETE 2+ VIEW
1 series · 2 of 2 positions shown · non-contrast
Comparison: None.

CLINICAL DATA: Right hip pain status post fall

EXAM:
RIGHT HIP - COMPLETE 2+ VIEW

[Series 1: ap · 0.17mm/px · 2 of 2 slices shown]
[im 1/2]
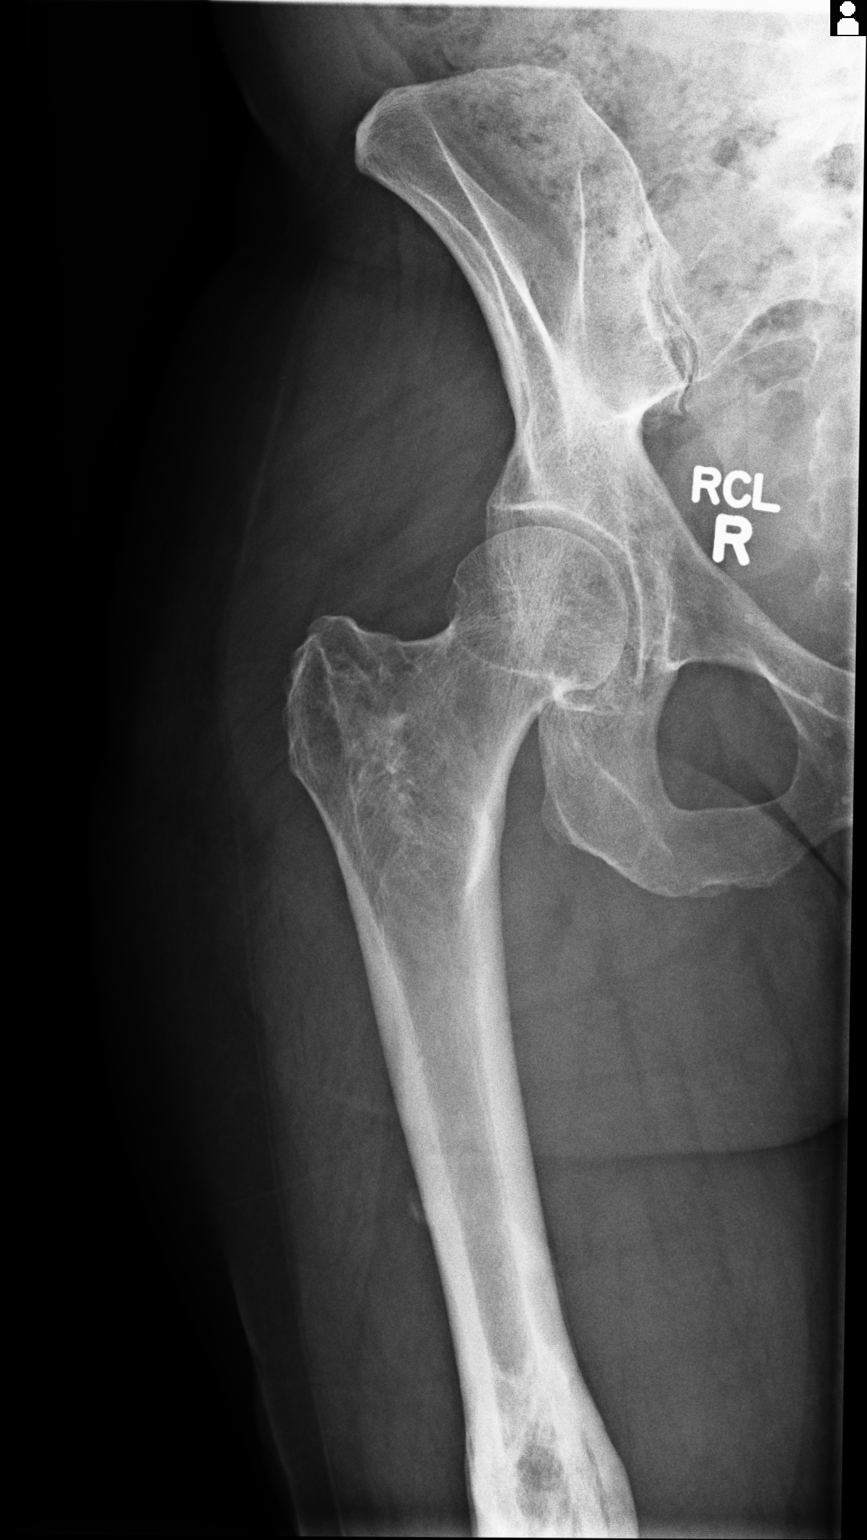
[im 2/2]
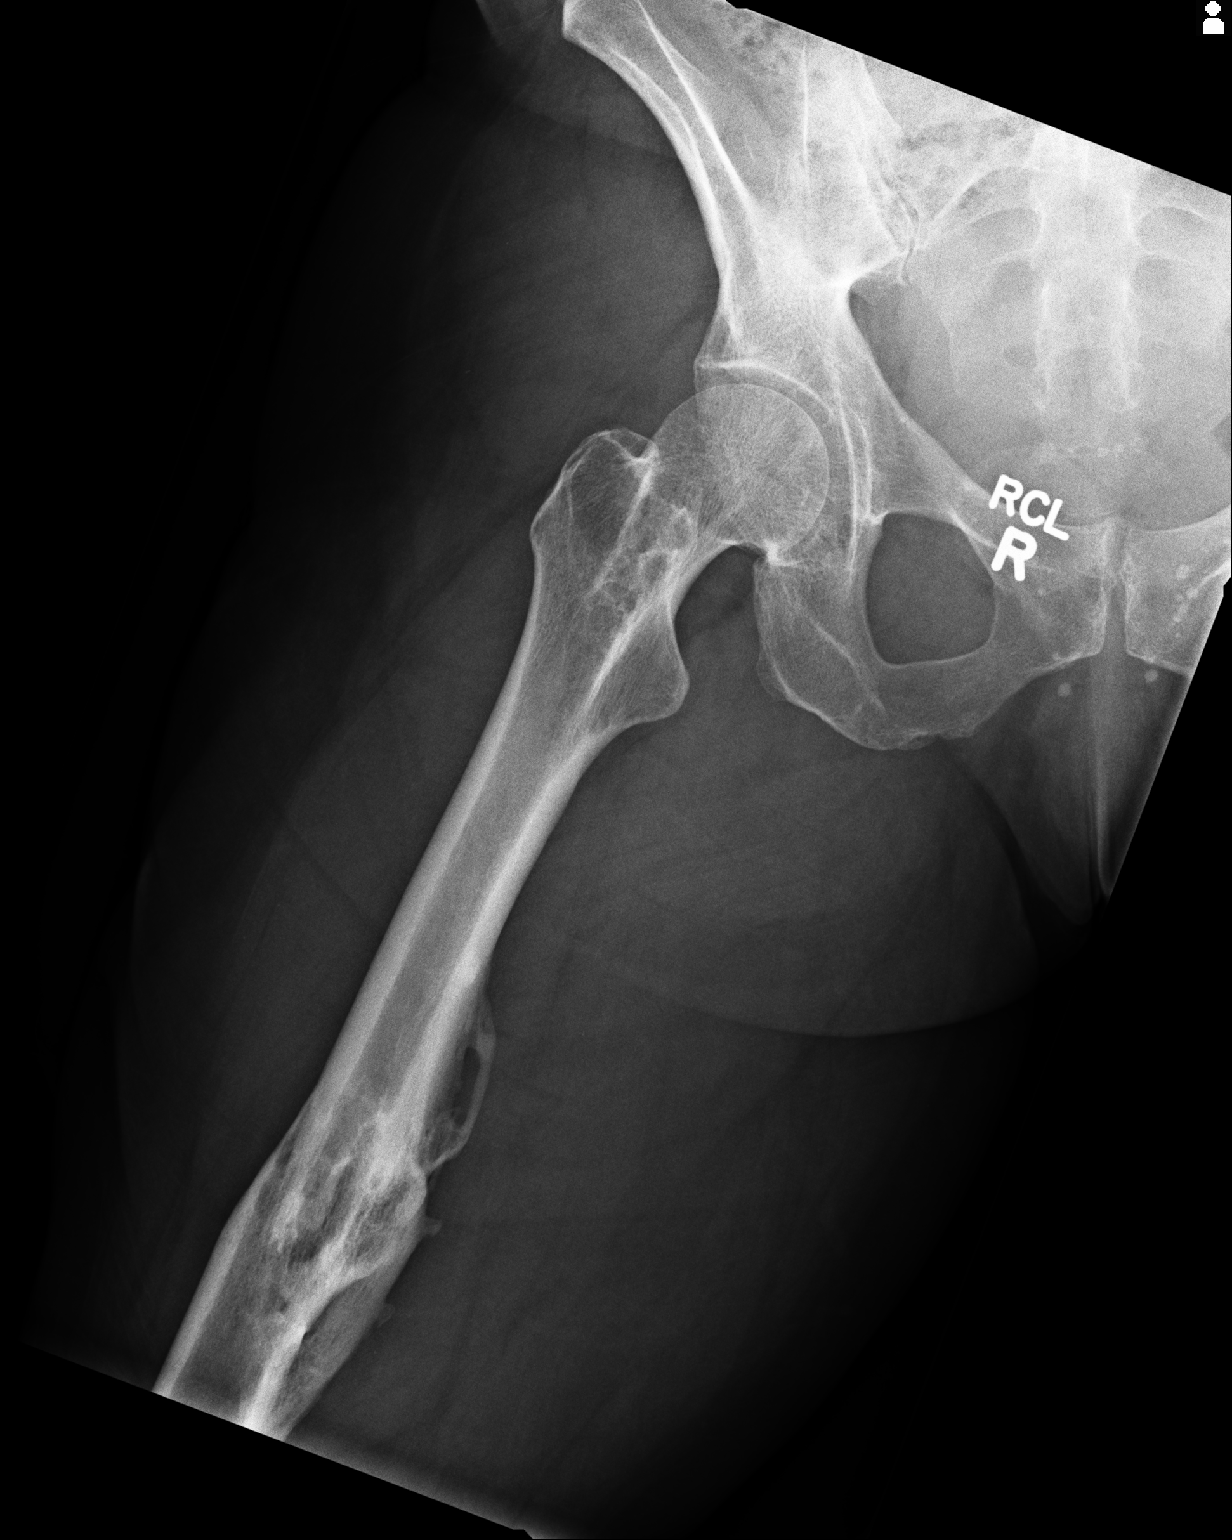

[2 of 2 positions shown; findings below may reference images not displayed]

FINDINGS: AP and lateral views of the right hip reveal the joint space to be
preserved. The femoral head and neck and intertrochanteric regions
exhibit no acute abnormalities. The remnants of a cortical tunnel
longitudinally oriented through the intertrochanteric region is
demonstrated. There is old deformity of the midshaft of the femur
likely secondary to previous fracture. The observed portions of the
right hemipelvis appear intact.
IMPRESSION: There is no acute bony abnormality of the right hip. There is
chronic deformity of the midshaft of the right femur.

## 2014-07-17 IMAGING — CR DG THORACIC SPINE 2-3V
1 series · 3 of 3 positions shown · non-contrast
Comparison: None.

CLINICAL DATA: Pain status post fall

EXAM:
THORACIC SPINE - 2 VIEW

[Series 1: ap · 0.17mm/px · 3 of 3 slices shown]
[im 1/3]
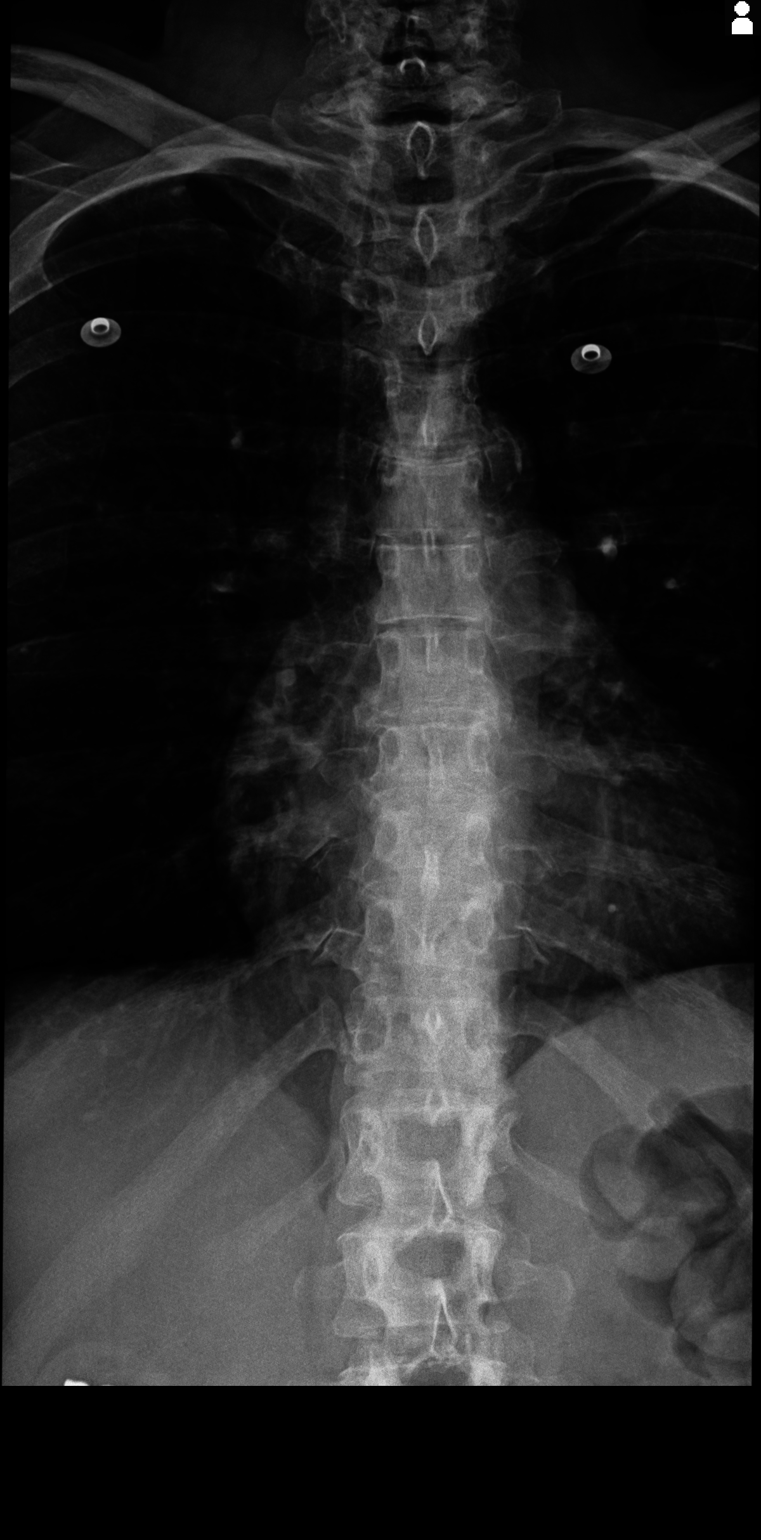
[im 2/3]
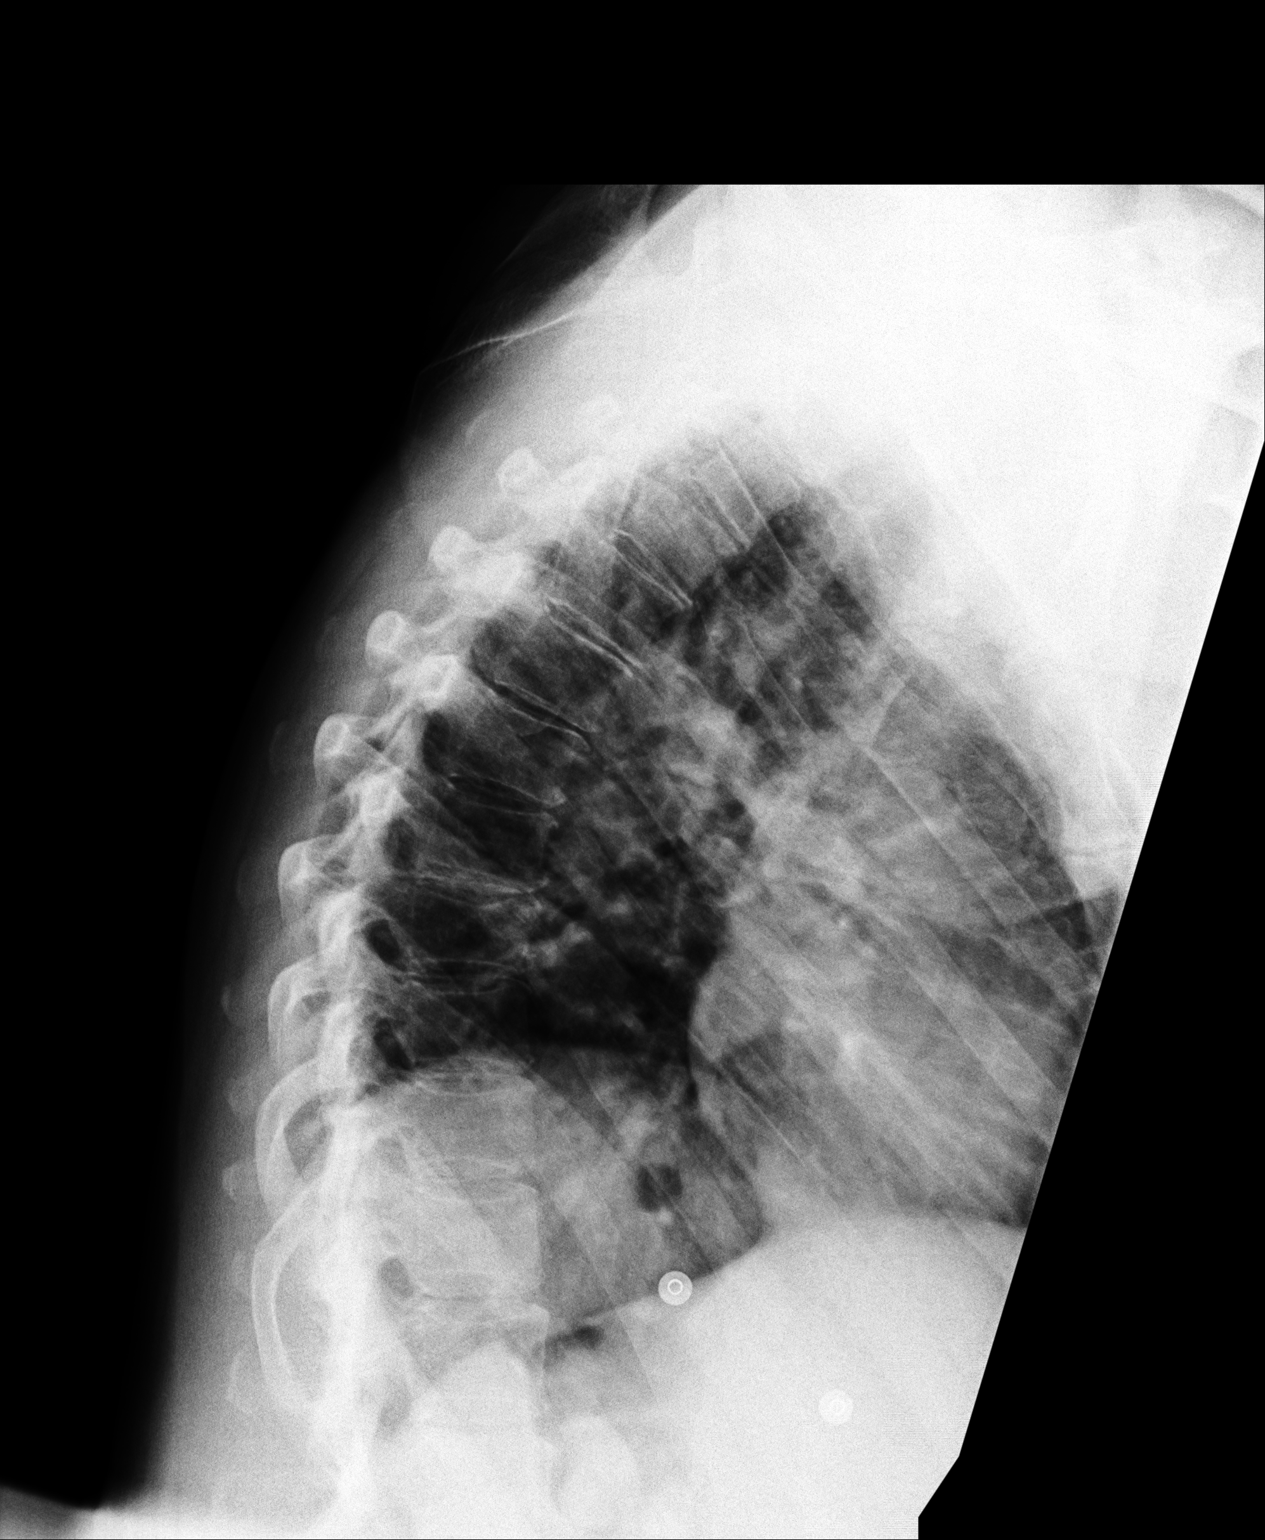
[im 3/3]
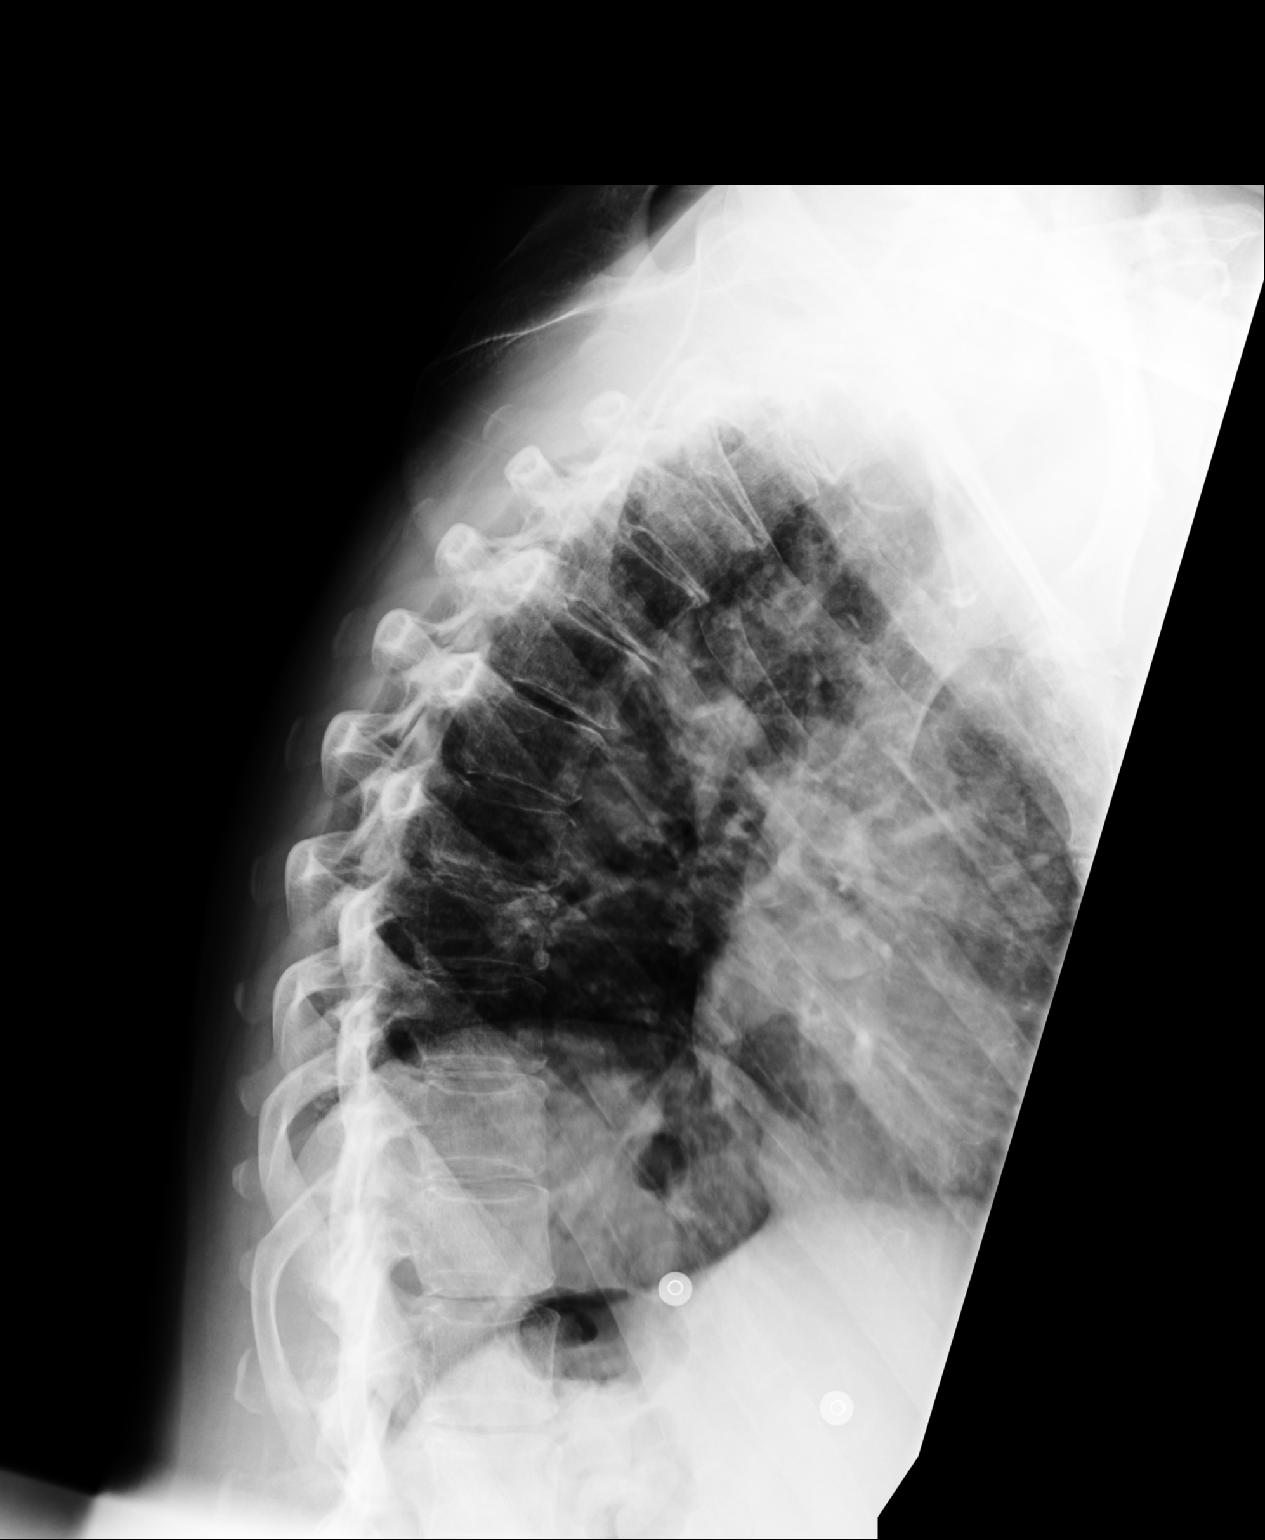

[3 of 3 positions shown; findings below may reference images not displayed]

FINDINGS: The thoracic vertebral bodies are preserved in height. The
intervertebral disc space heights are mildly narrowed at multiple
levels. The pedicles are grossly intact. No abnormal paravertebral
soft tissue densities are demonstrated.
IMPRESSION: There is no acute bony abnormality of the thoracic spine. There are
mild degenerative disc changes at multiple levels.

## 2014-07-17 IMAGING — CR DG LUMBAR SPINE 2-3V
1 series · 3 of 3 positions shown · non-contrast
Comparison: Plain films lumbar spine 02/18/2012.

CLINICAL DATA: Back pain after a fall.

EXAM:
LUMBAR SPINE - 2-3 VIEW

[Series 1: ap · 0.17mm/px · 3 of 3 slices shown]
[im 1/3]
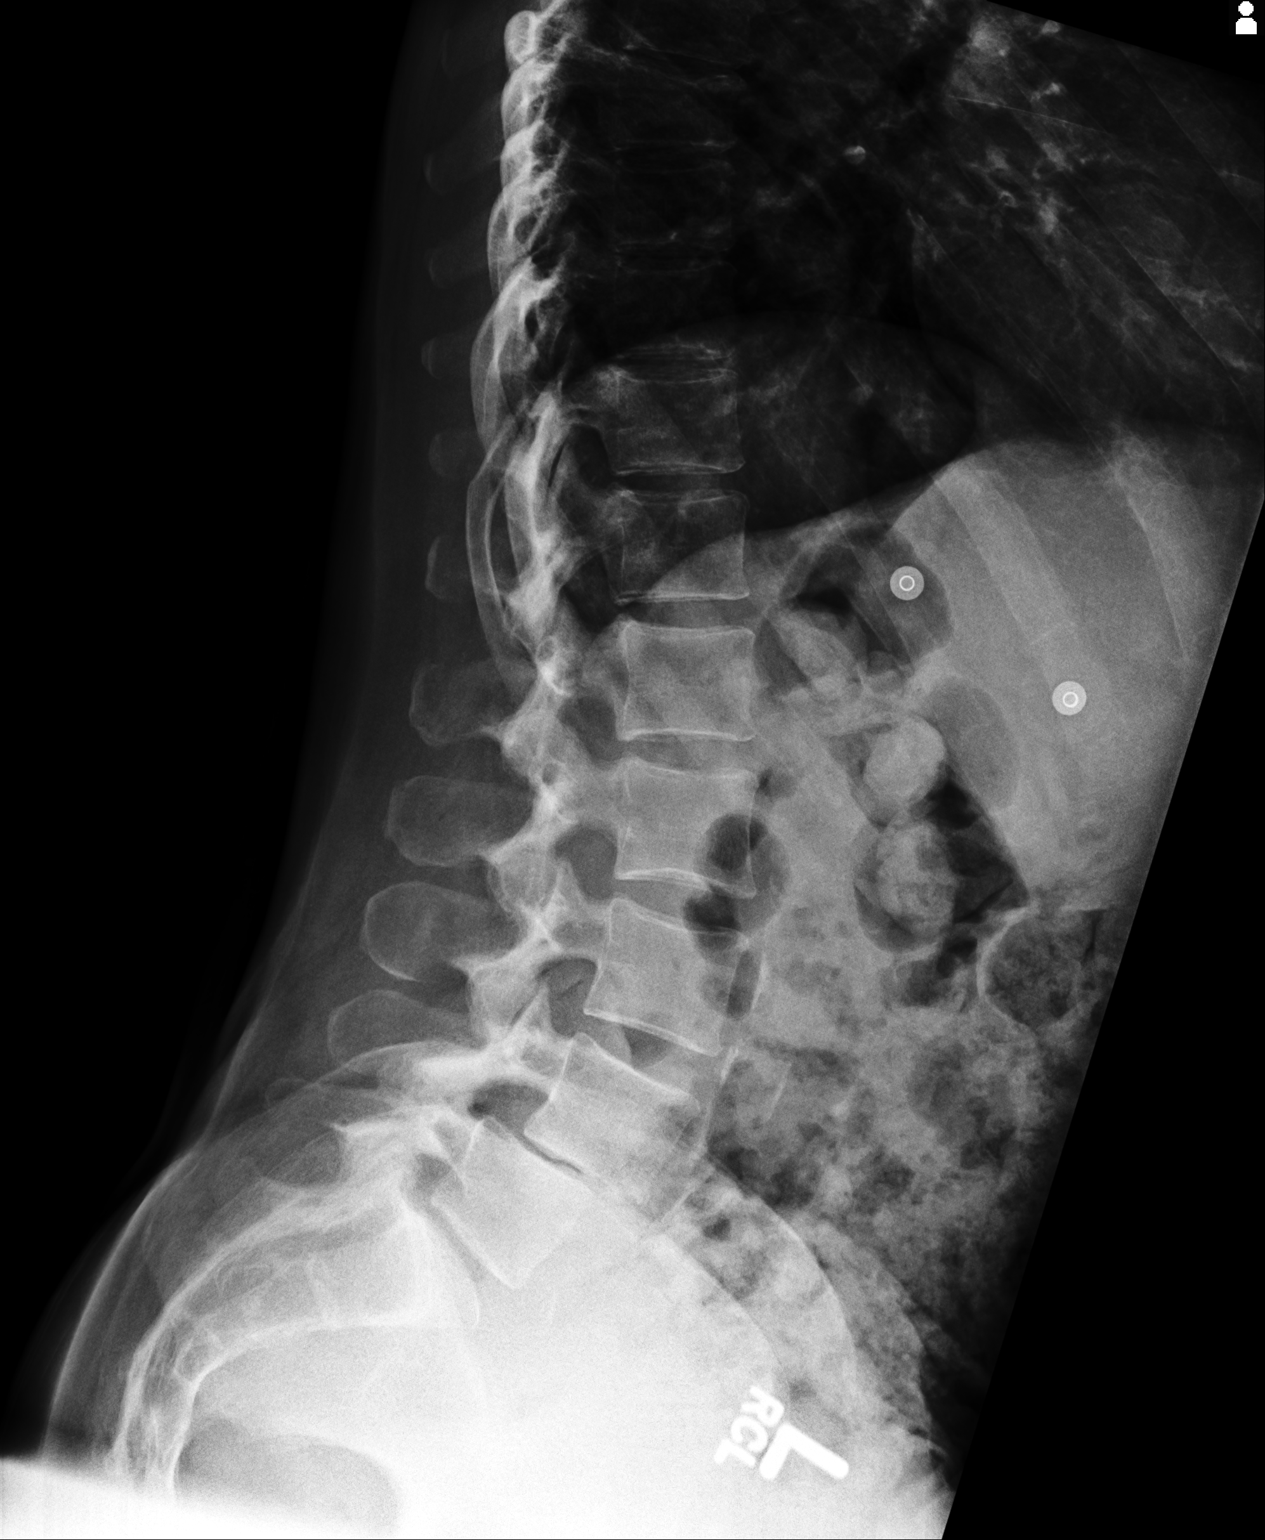
[im 2/3]
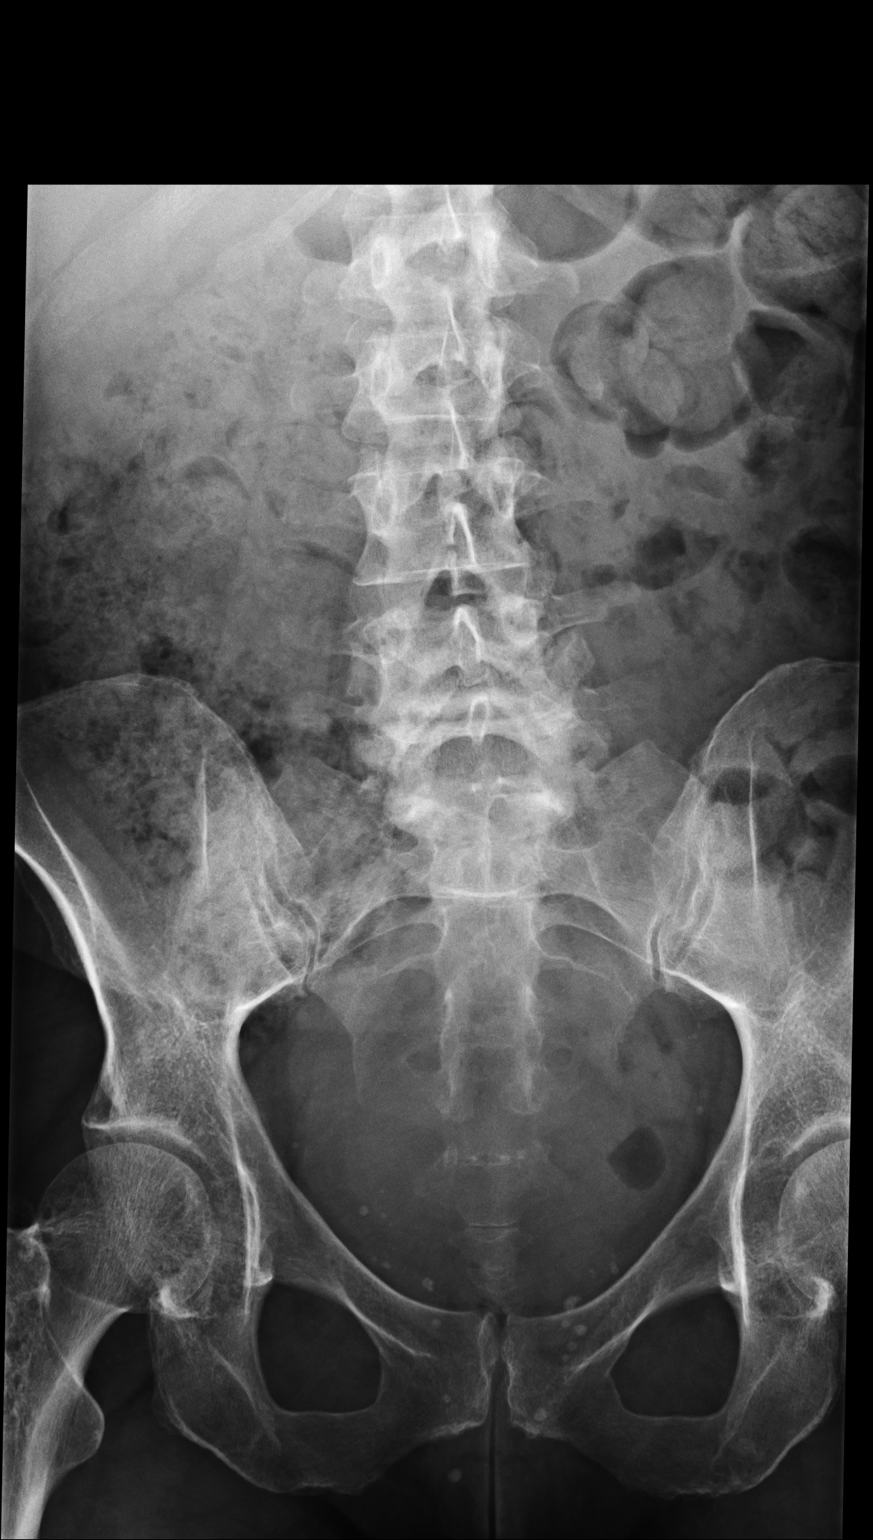
[im 3/3]
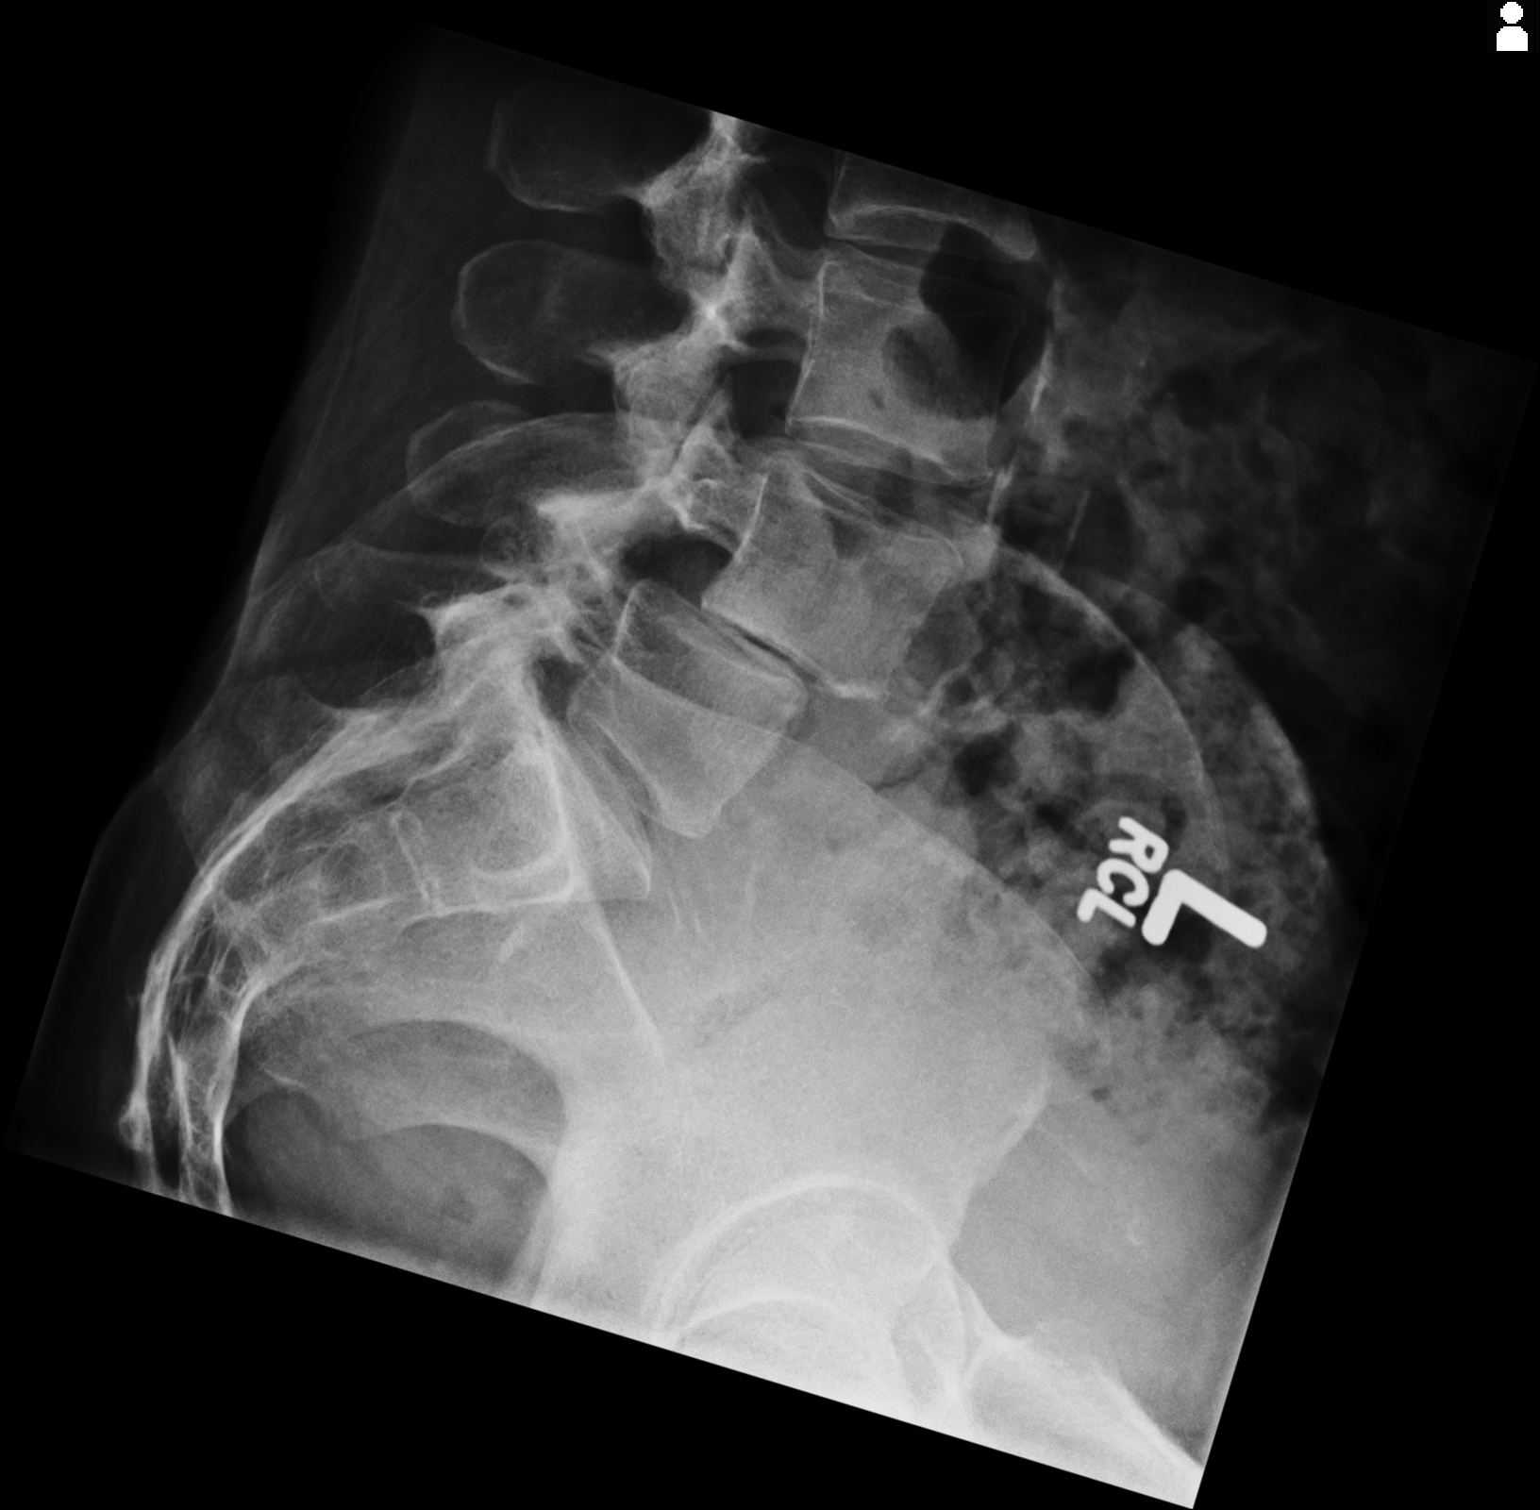

[3 of 3 positions shown; findings below may reference images not displayed]

FINDINGS: Vertebral body height is maintained. There is unchanged 1.3 cm
anterolisthesis L4 on L5 due to facet arthropathy. There is marked
loss of disc space height at L4-5. Paraspinous structures
demonstrate a very large volume stool in the colon.
IMPRESSION: Negative for fracture or other acute abnormality.

Massive volume of stool in the colon.

No change in 1.3 cm facet mediated anterolisthesis L4 on L5.

## 2014-07-17 IMAGING — CT CT CERVICAL SPINE WITHOUT CONTRAST
5 series · 15 of 33 positions shown, 17 images · non-contrast
Comparison: CT CERVICAL SPINE W/O CM dated 01/11/2013

CLINICAL DATA: Neck pain status post fall, history of C3-C4 fusion

EXAM:
CT CERVICAL SPINE WITHOUT CONTRAST
TECHNIQUE: Multidetector CT imaging of the cervical spine was performed without
intravenous contrast. Multiplanar CT image reconstructions were also
generated.

[Series 3: c spine soft · axial · 0.29mm/px · 1 of 65 slices shown]
[im 22/65  soft-tissue]
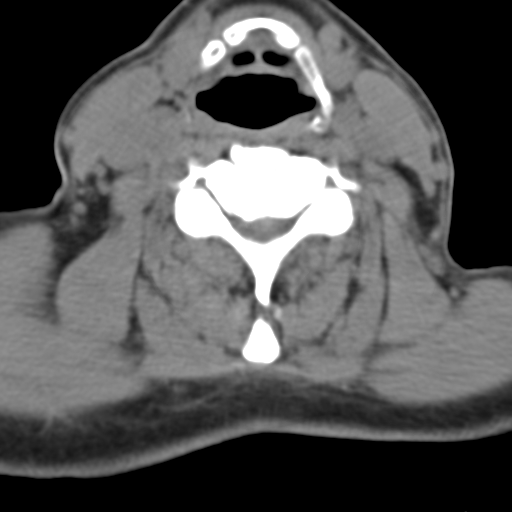

[Series 4: sag bone · sagittal · 0.26mm/px · 5 of 56 slices shown, 6 images]
[im 19/56  bone]
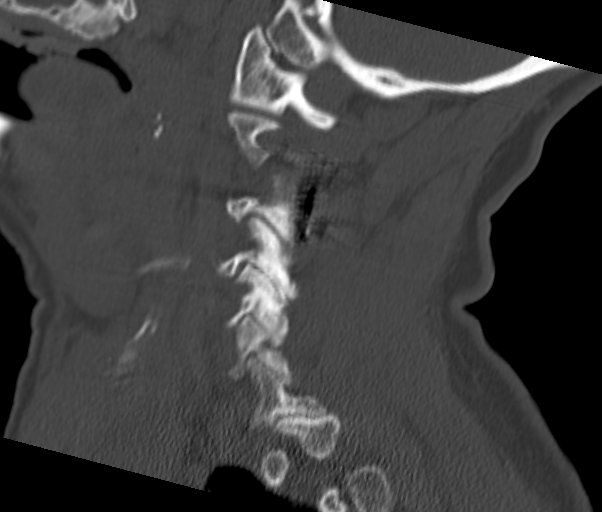
[im 23/56  bone]
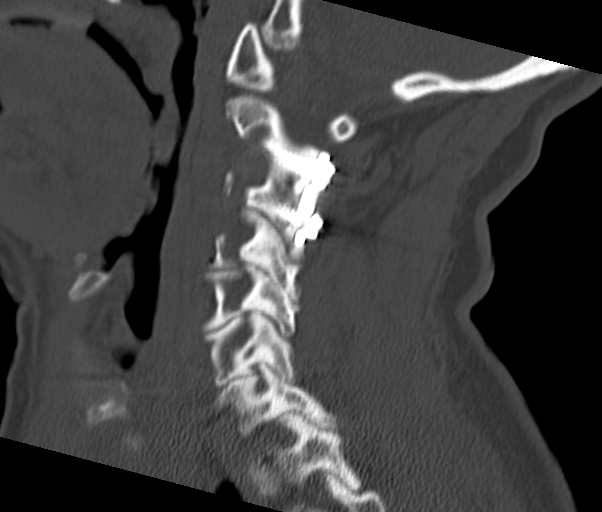
[im 28/56  soft-tissue]
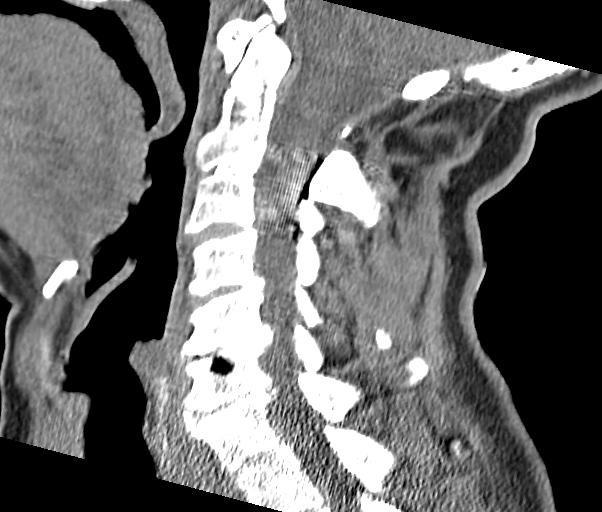
[im 28/56  bone]
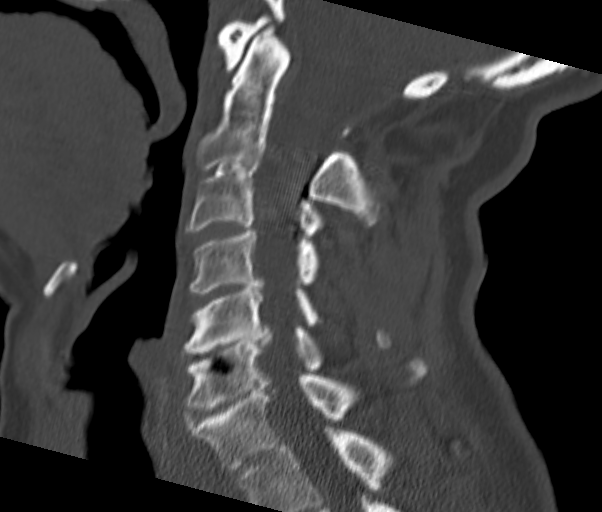
[im 33/56  bone]
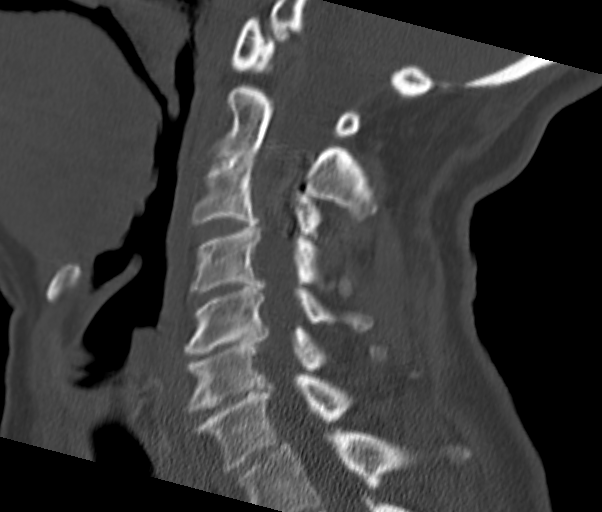
[im 37/56  bone]
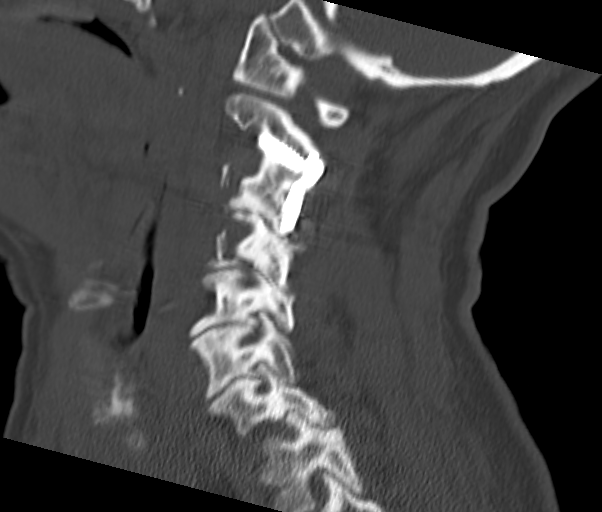

[Series 5: cor bone · coronal · 0.28mm/px · 3 of 50 slices shown]
[im 10/50  bone]
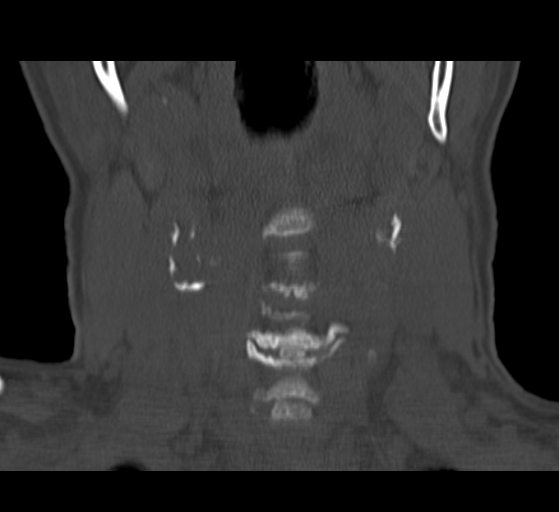
[im 20/50  bone]
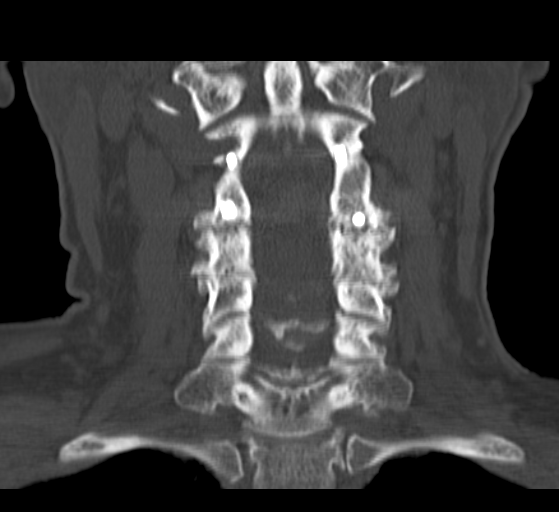
[im 30/50  bone]
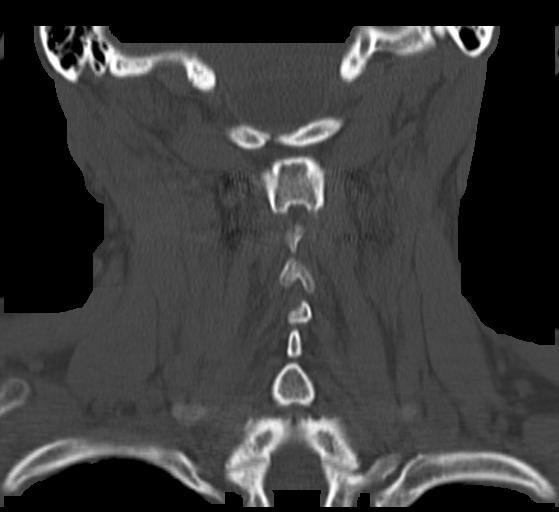

[Series 6: orthogonal axials · axial · 0.26mm/px · z∈[-299,-233]mm · 3 of 69 slices shown, 4 images]
[im 18/69  soft-tissue]
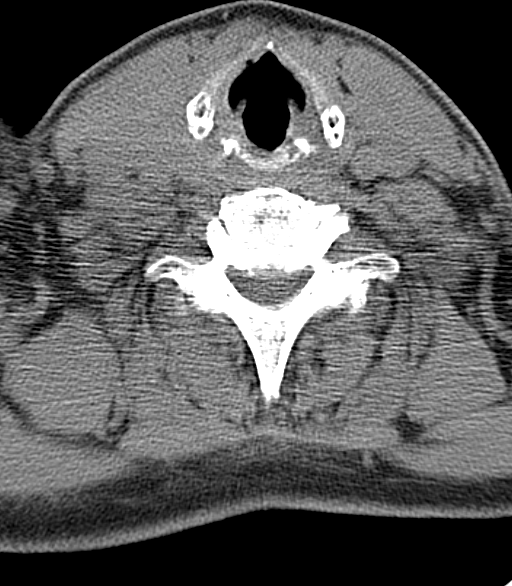
[im 18/69  bone]
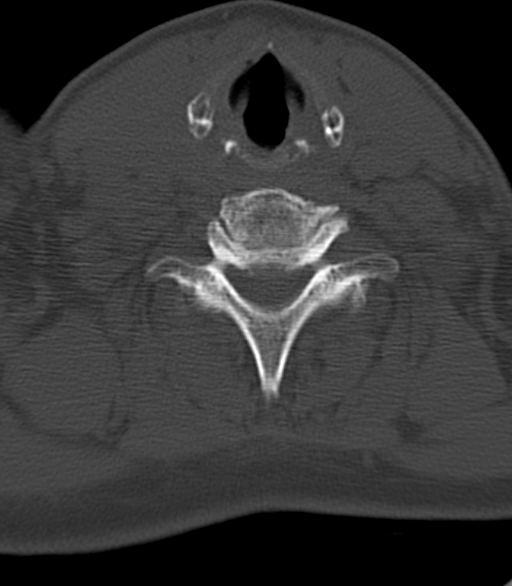
[im 35/69  bone]
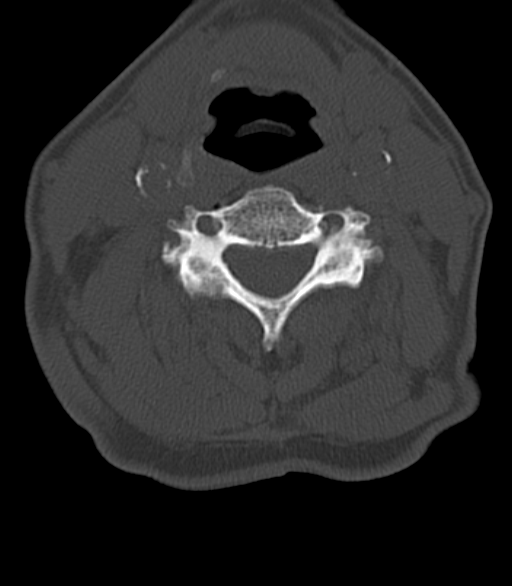
[im 52/69  bone]
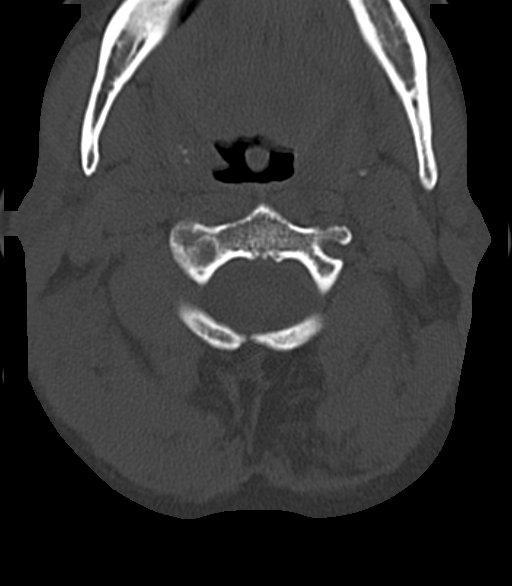

[Series 9: c spine bone · axial · 0.32mm/px · z∈[-282,-218]mm · 3 of 64 slices shown]
[im 16/64  bone]
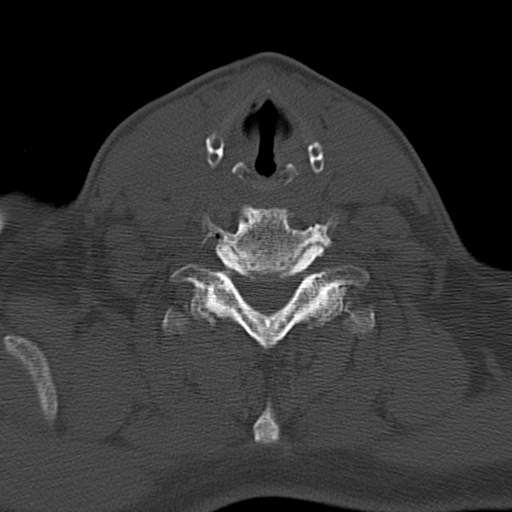
[im 32/64  bone]
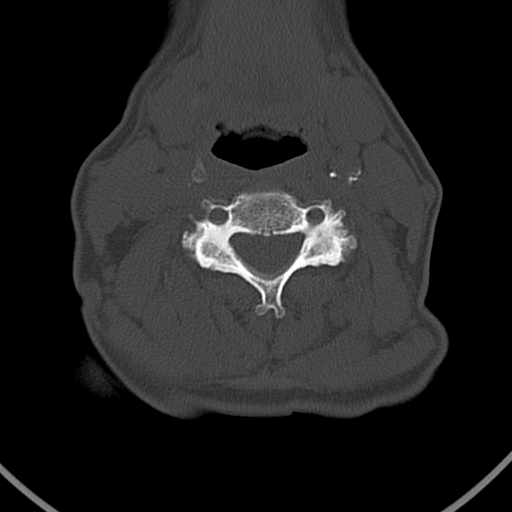
[im 48/64  bone]
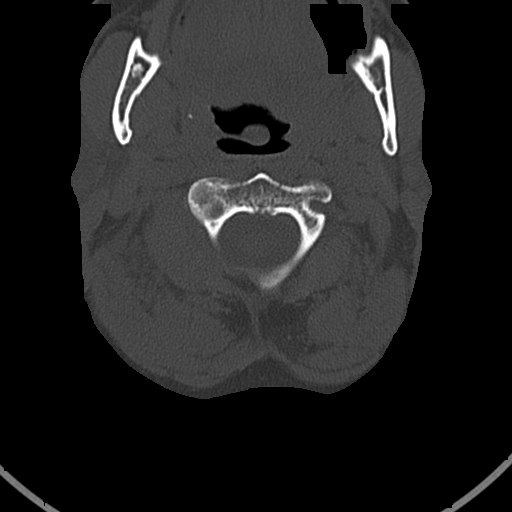

[15 of 33 positions shown; findings below may reference images not displayed]

FINDINGS: The cervical vertebral bodies are preserved in height. There is disc
space narrowing at C4-5, C5-6, and at C6-7 with anterior and
posterior endplate osteophytes. The patient has undergone posterior
fusion across the C2-3 disc level. An intradiscal device is present.
Pedicle screws and connecting metallic plates are present and appear
intact. There is no evidence of a perched facet nor spinous process
fracture. The bony ring at each cervical level is intact. There is
congenital incomplete fusion of the posterior arch of C1. There is
moderate bilateral encroachment upon the neural foramina at C5-6,
C6-7, and C7-T1. The observed portions of the first and second ribs
appear intact. The pulmonary apices exhibit no acute abnormalities.
IMPRESSION: 1. There is no evidence of an acute cervical spine fracture nor
dislocation.
2. There is post fusion change at the C2-3 level.
3. There is degenerative disc and facet joint change in the mid and
lower cervical spine which is stable.

## 2014-07-17 IMAGING — CR DG HIP COMPLETE 2+V*L*
1 series · 3 of 3 positions shown · non-contrast
Comparison: DG PELVIS 1-2V dated 12/03/2011

CLINICAL DATA: Left hip pain status post fall

EXAM:
LEFT HIP - COMPLETE 2+ VIEW

[Series 1: ap · 0.17mm/px · 3 of 3 slices shown]
[im 1/3]
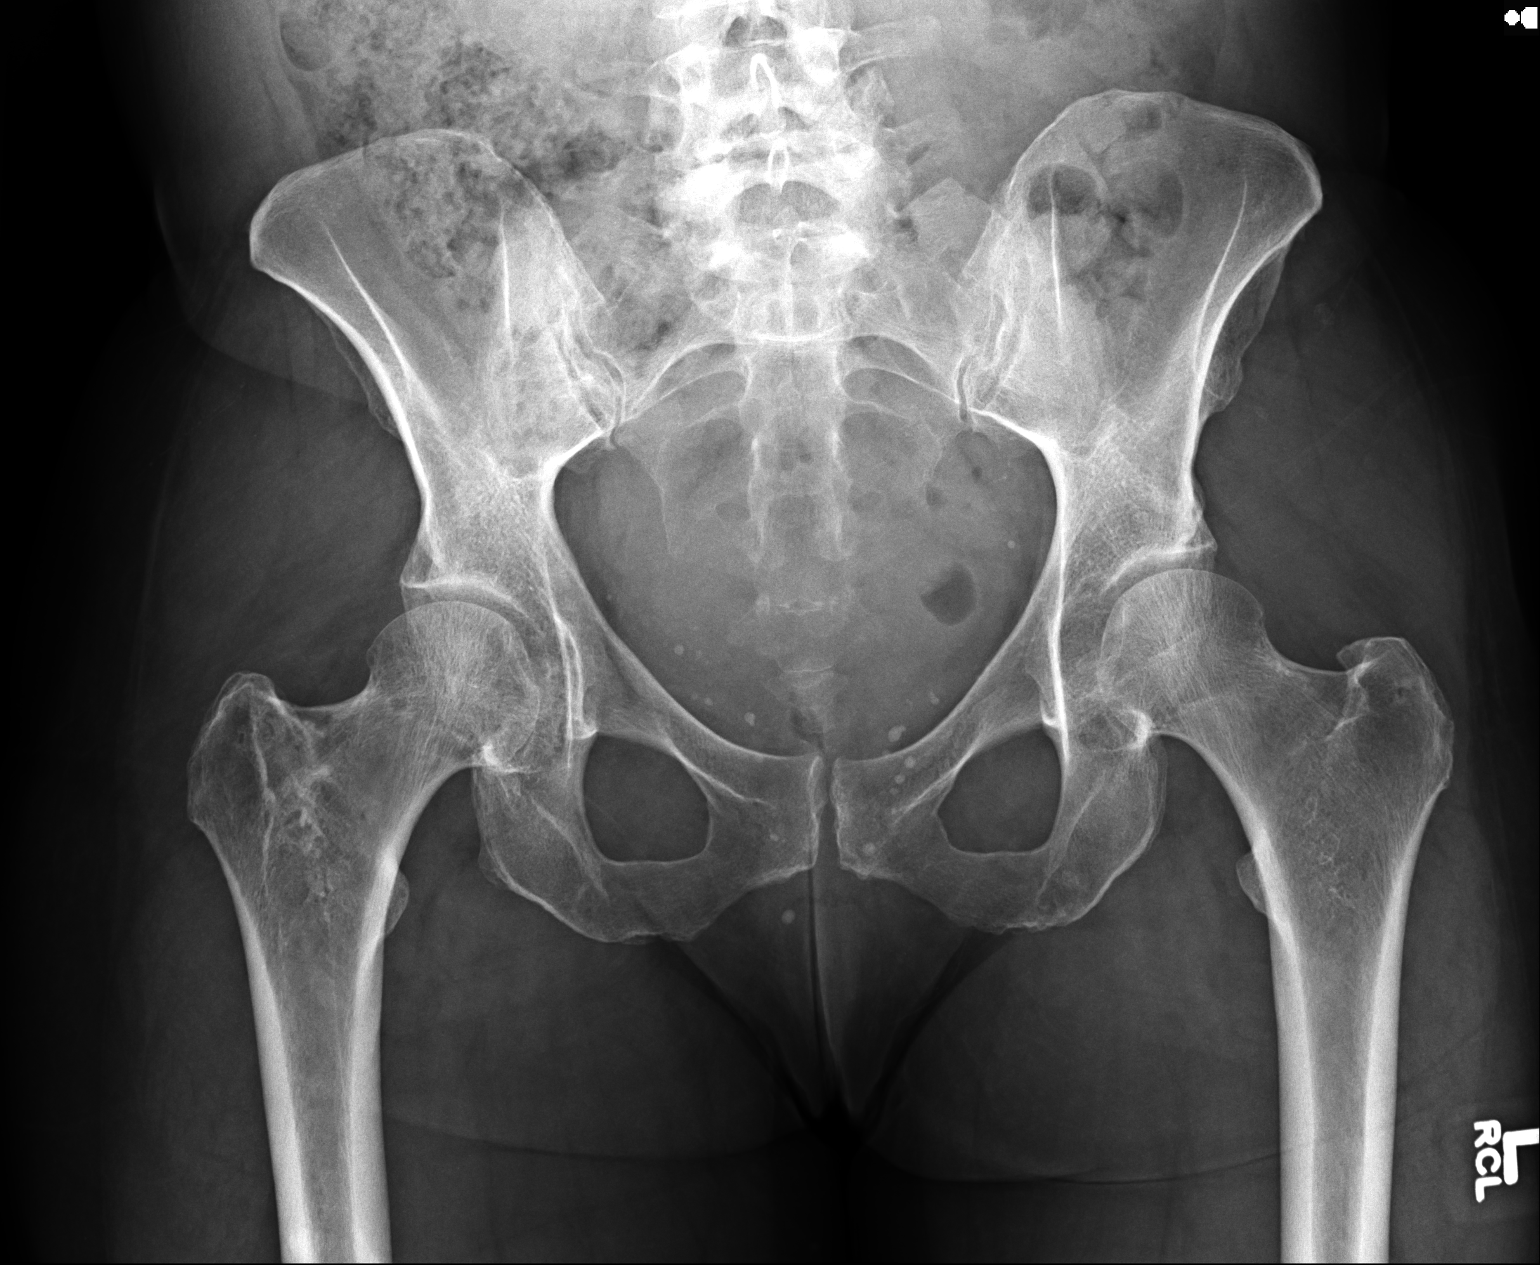
[im 2/3]
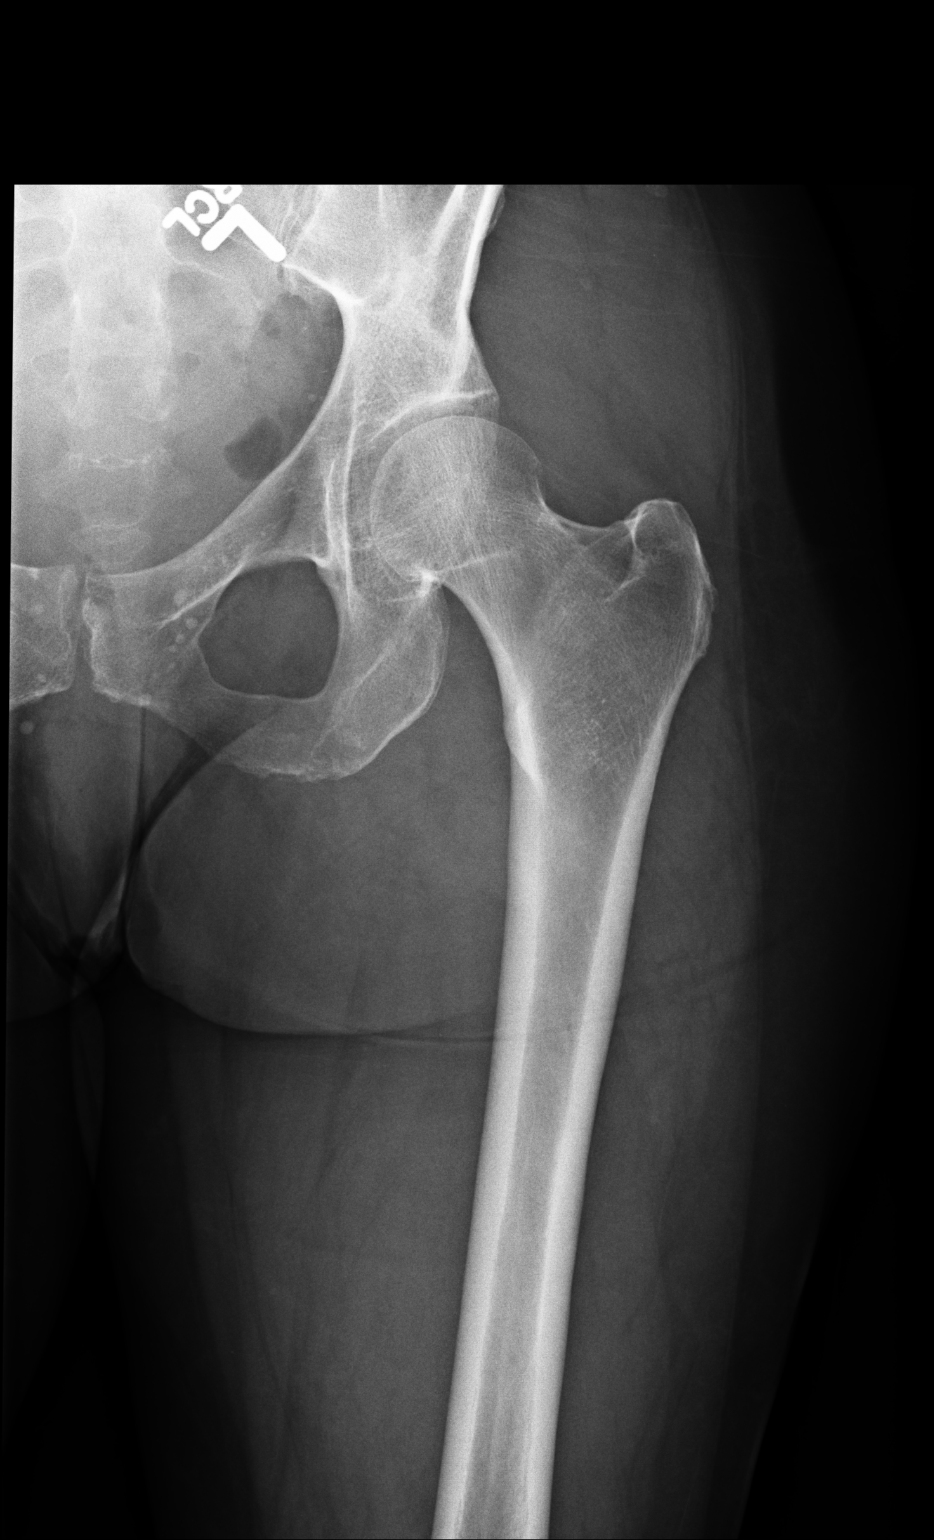
[im 3/3]
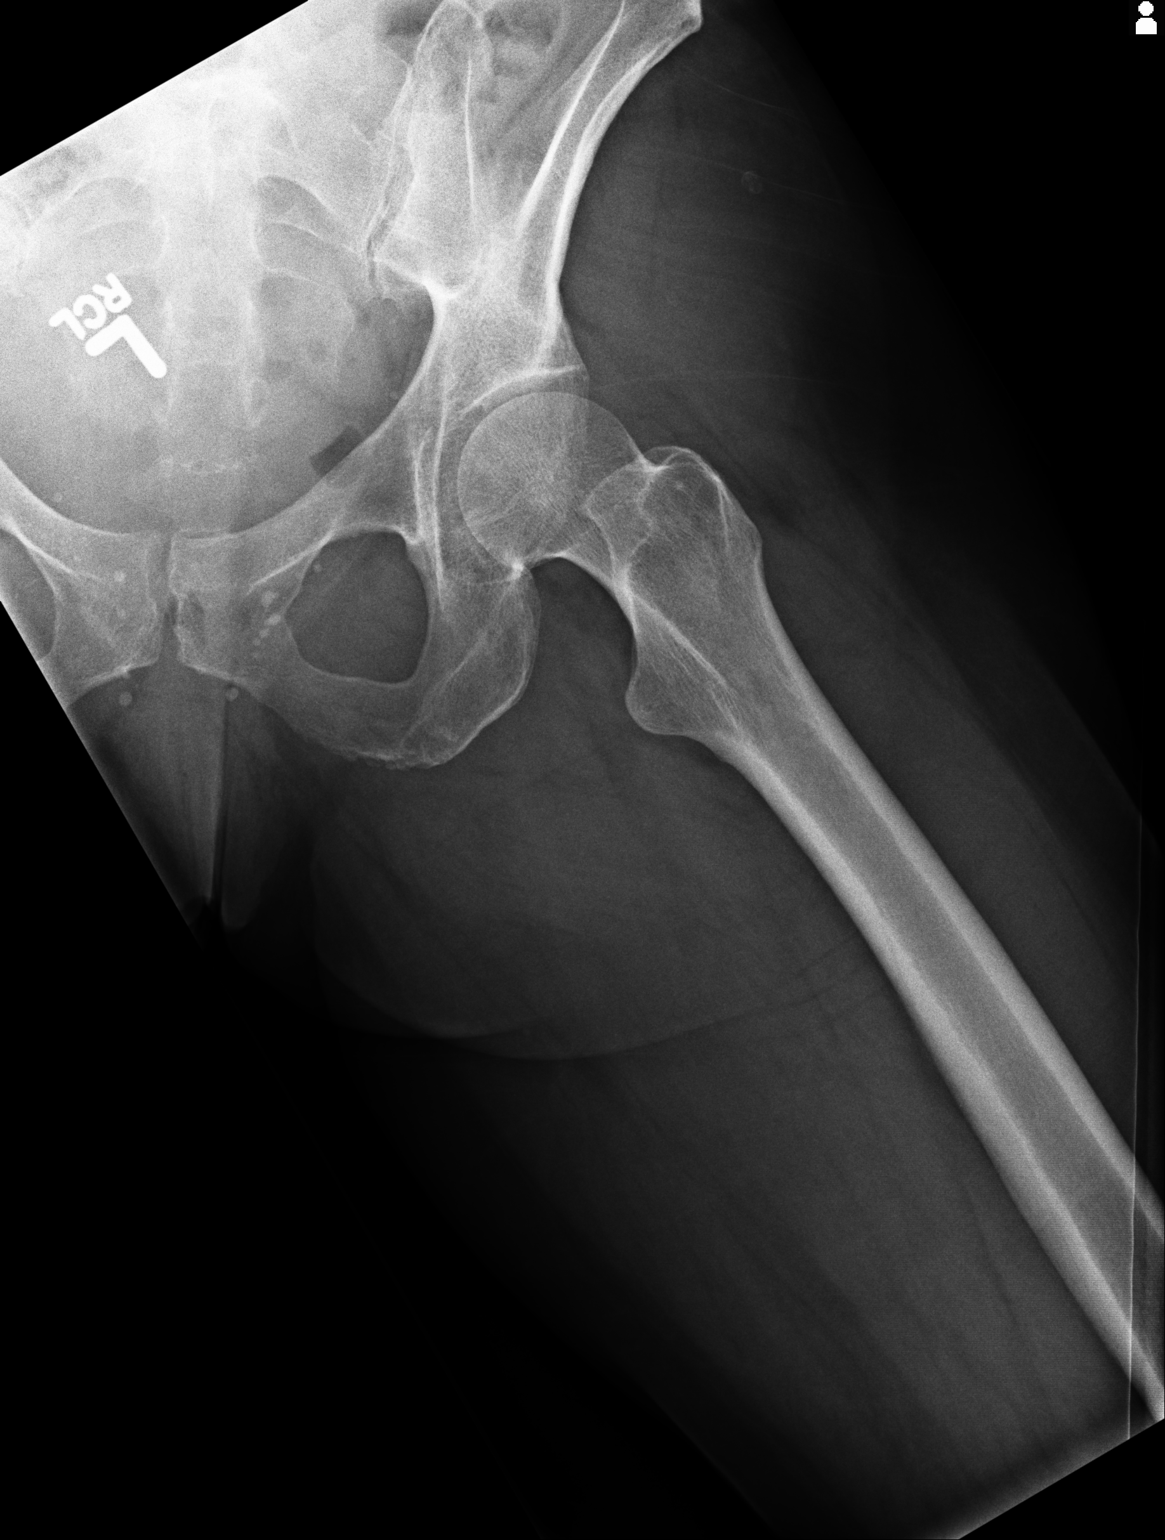

[3 of 3 positions shown; findings below may reference images not displayed]

FINDINGS: The bony pelvis is reasonably well mineralized for age. There is no
evidence of an acute fracture. AP and lateral views of the left hip
reveal no evidence of fracture or dislocation. The overlying soft
tissues appear normal for age.
IMPRESSION: There is no acute bony abnormality of the left hip.

## 2014-08-12 IMAGING — CR DG KNEE COMPLETE 4+V*R*
1 series · 5 of 5 positions shown · non-contrast
Comparison: None.

CLINICAL DATA: Fall

EXAM:
RIGHT KNEE - COMPLETE 4+ VIEW

[Series 1: t knee ap right · 0.14mm/px · 5 of 5 slices shown]
[im 1/5]
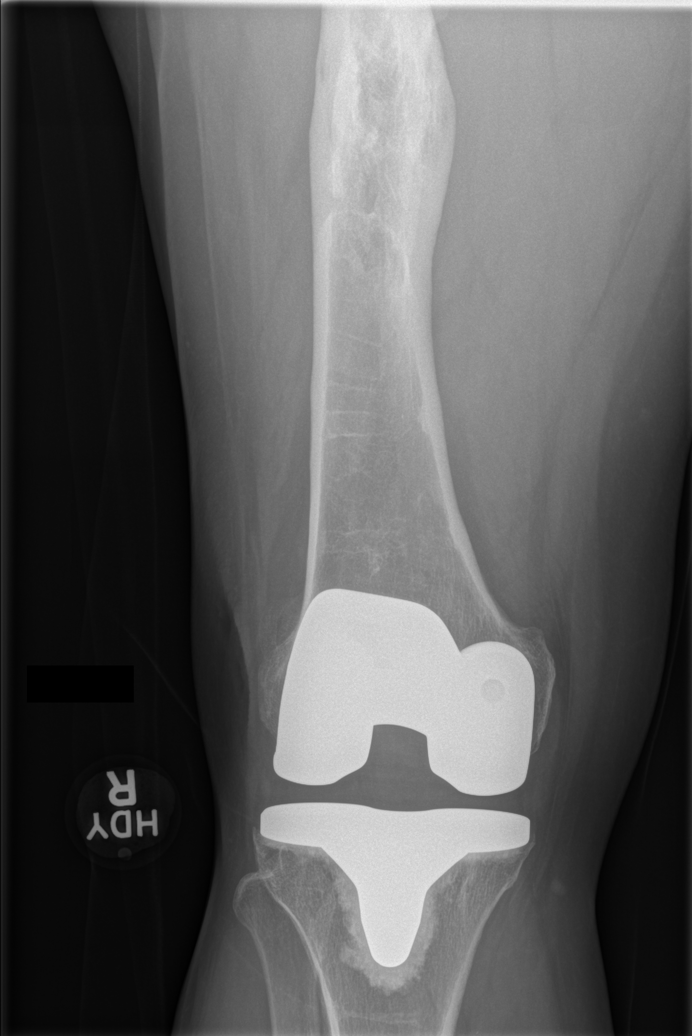
[im 2/5]
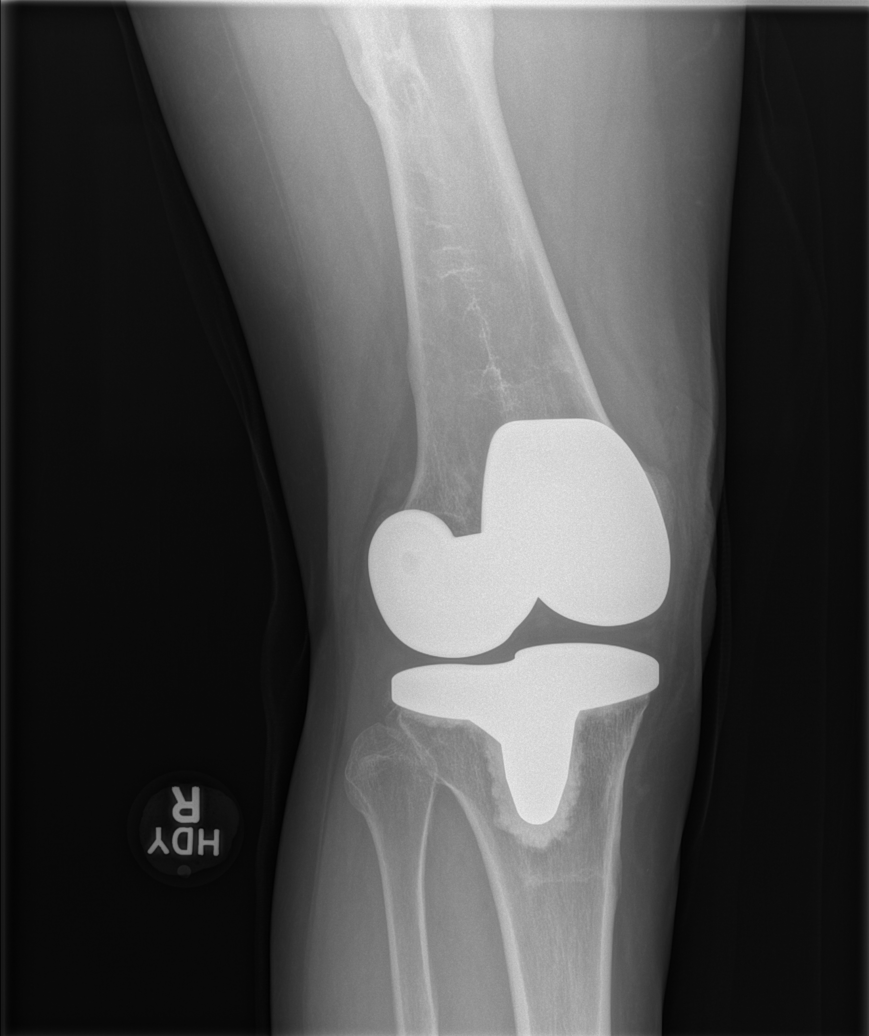
[im 3/5]
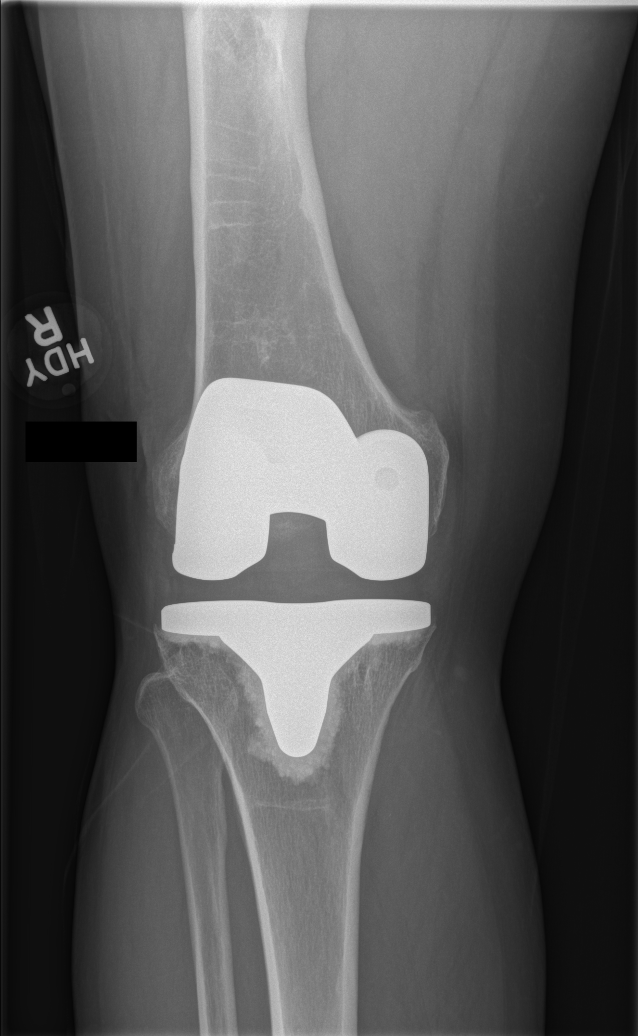
[im 4/5]
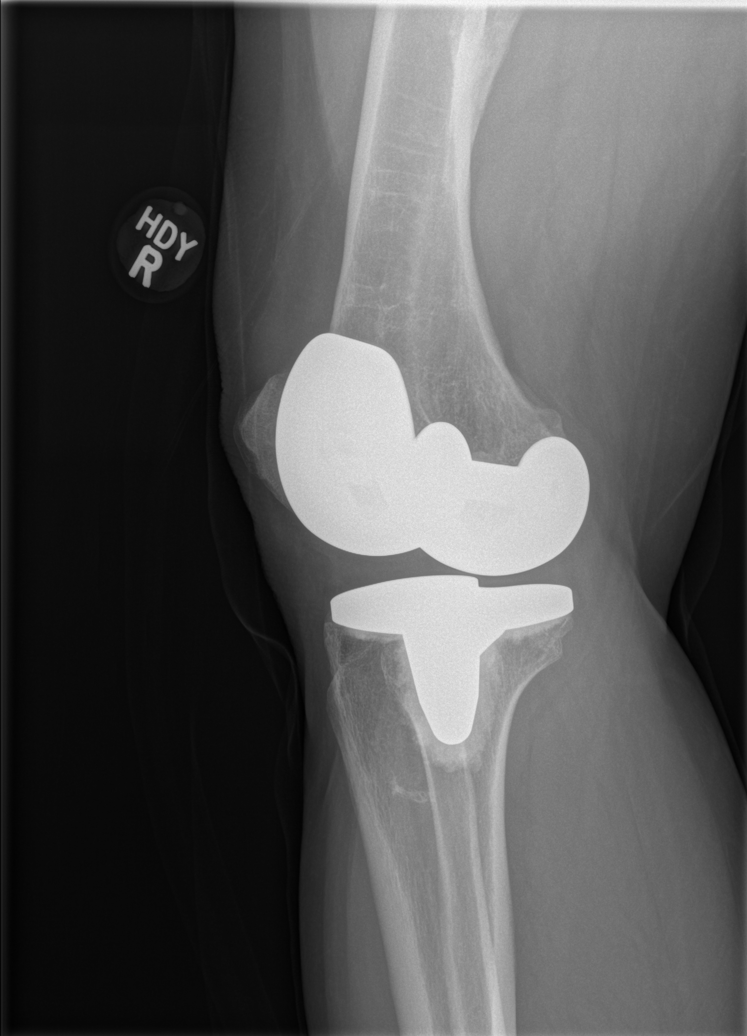
[im 5/5]
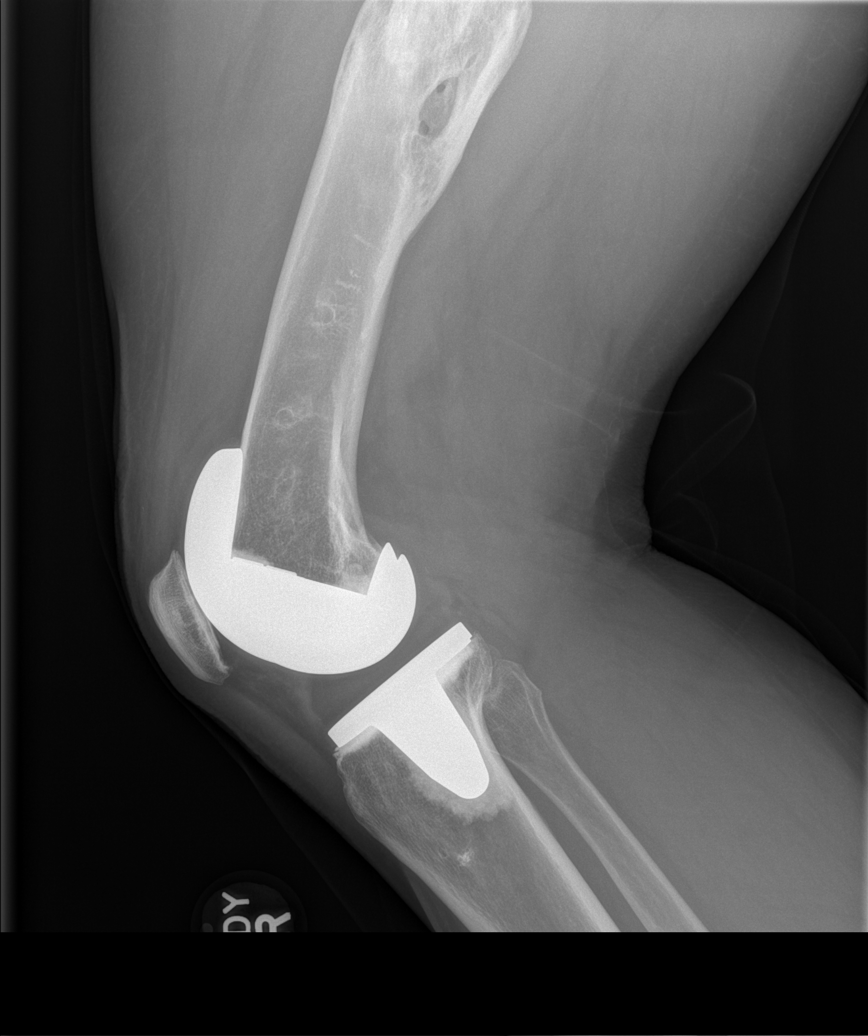

[5 of 5 positions shown; findings below may reference images not displayed]

FINDINGS: Total knee arthroplasty is in place. Anatomic alignment. No breakage
or loosening of the hardware. No acute fracture. Osteopenia. Chronic
deformity of the distal femur.
IMPRESSION: No acute bony pathology.

## 2014-08-12 IMAGING — CR DG SHOULDER 3+V*L*
1 series · 4 of 4 positions shown · non-contrast
Comparison: None.

CLINICAL DATA: Fall

EXAM:
DG SHOULDER 3+VIEWS LEFT

[Series 1: t scapula y-view left · 0.14mm/px · 4 of 4 slices shown]
[im 1/4]
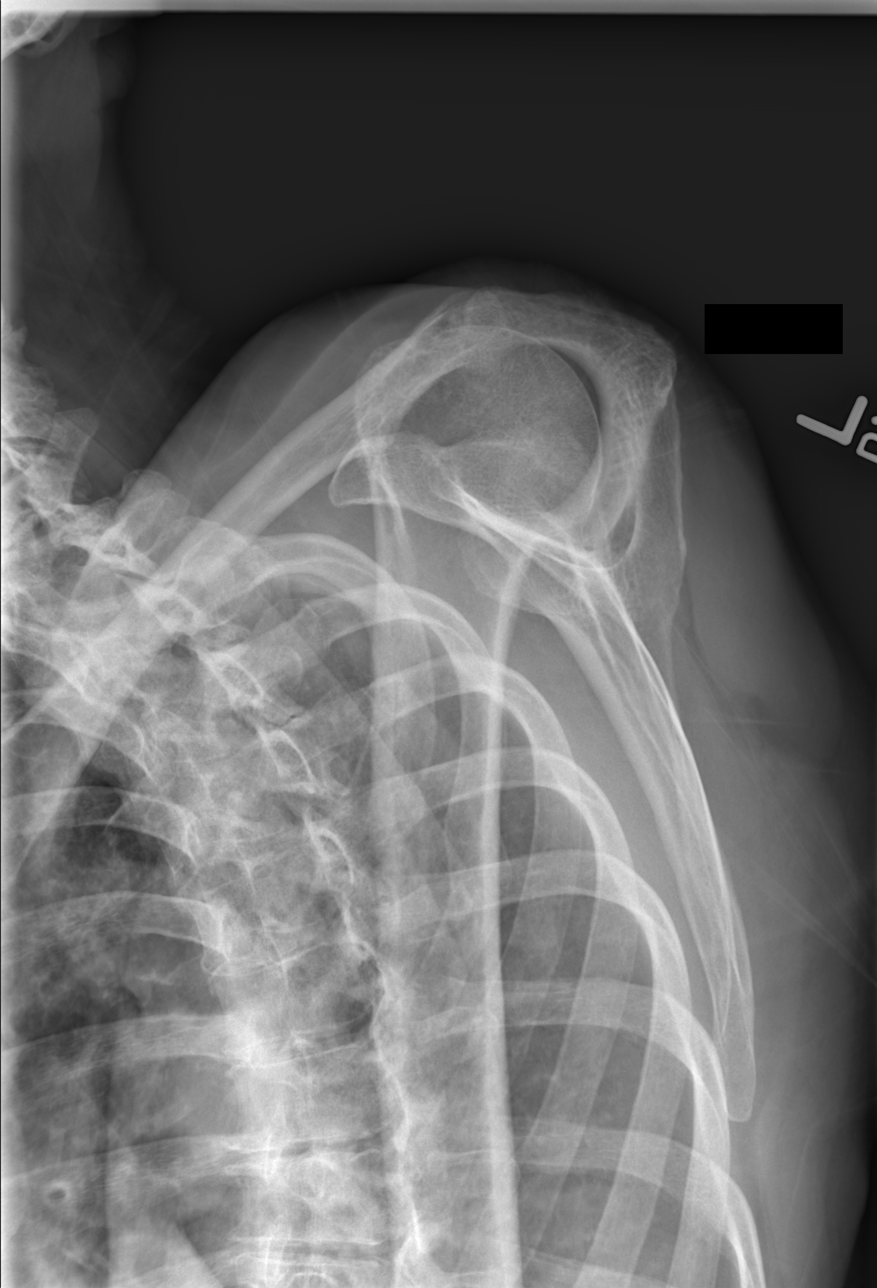
[im 2/4]
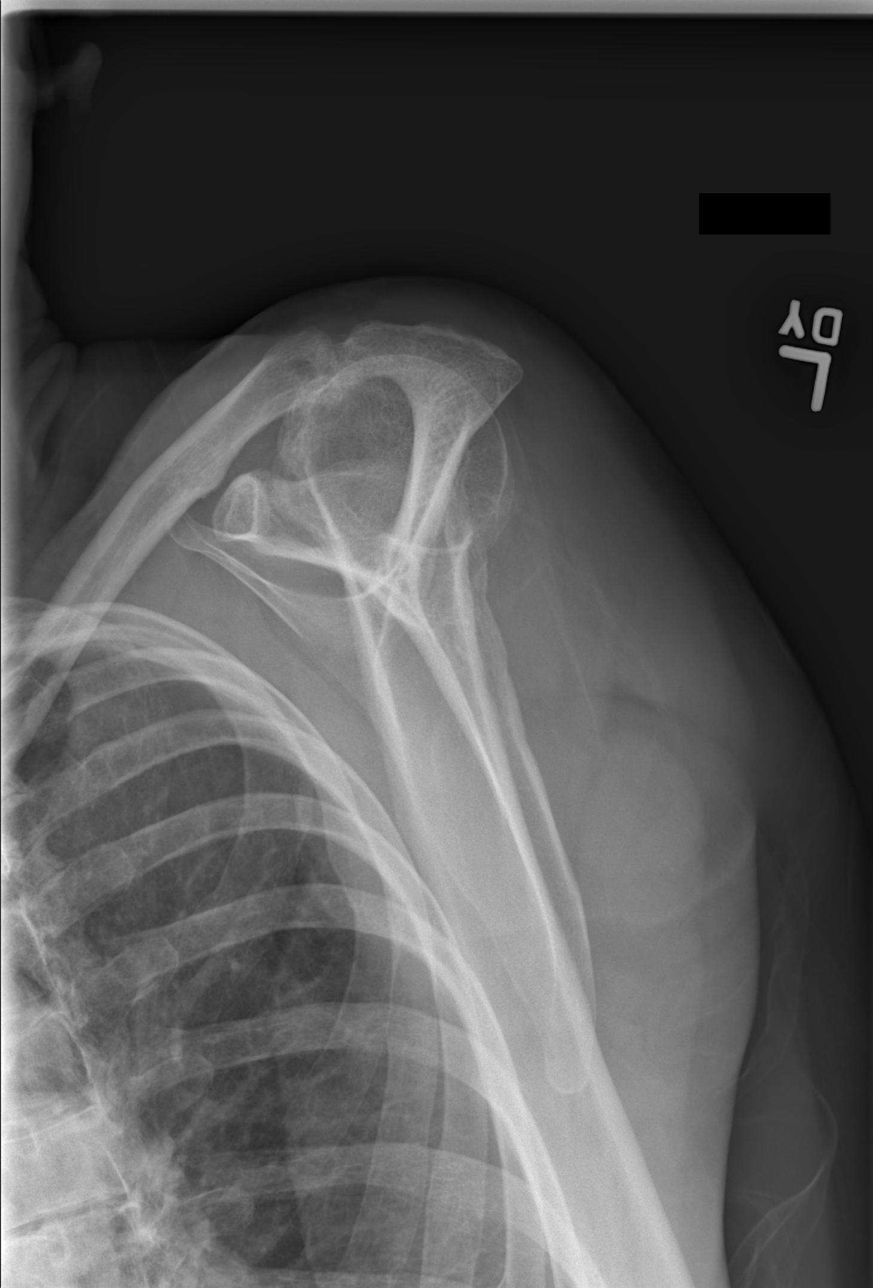
[im 3/4]
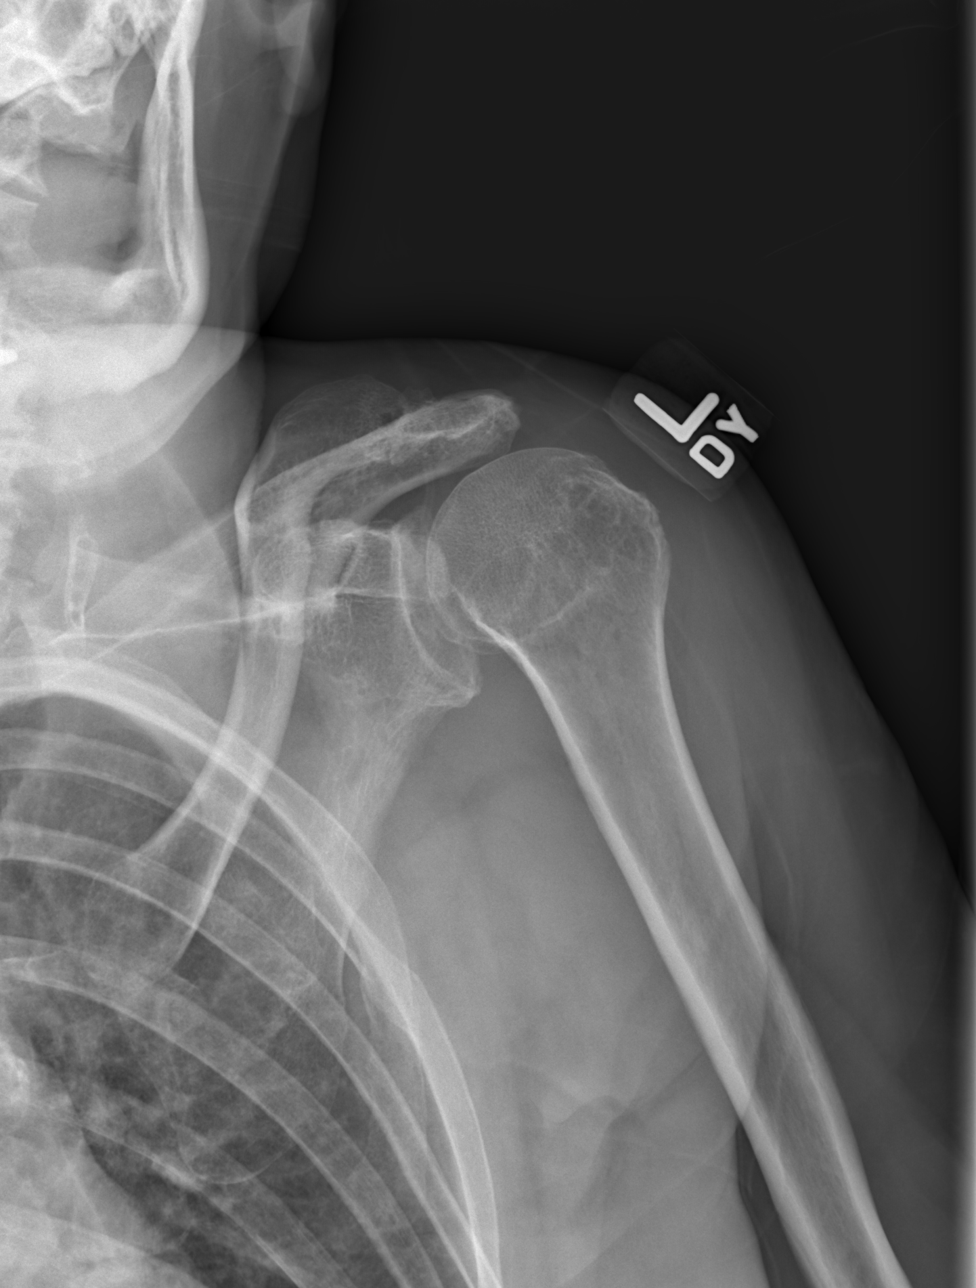
[im 4/4]
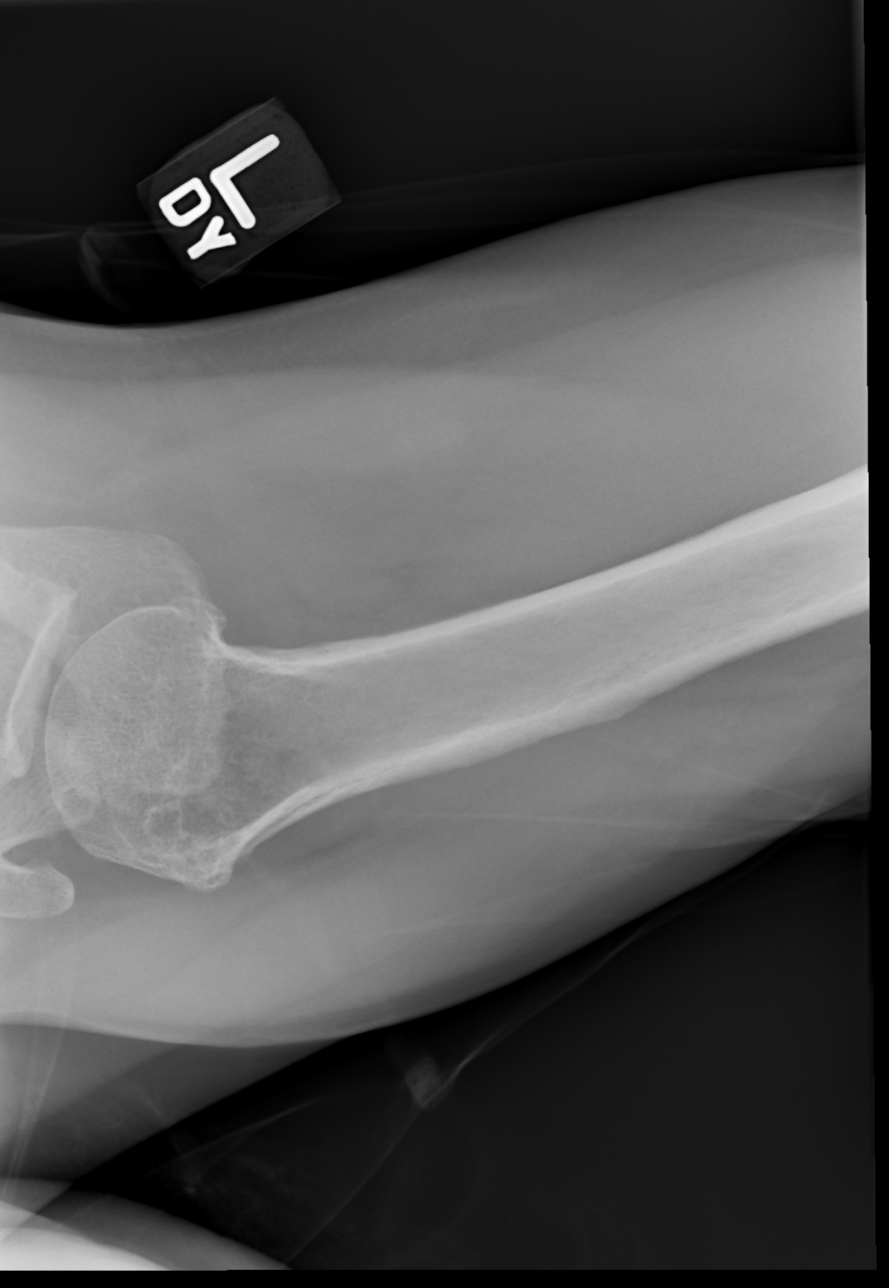

[4 of 4 positions shown; findings below may reference images not displayed]

FINDINGS: No acute fracture.  No dislocation.  Degenerative changes are noted.
IMPRESSION: Negative.

## 2014-09-13 ENCOUNTER — Other Ambulatory Visit: Payer: Self-pay | Admitting: *Deleted

## 2014-09-13 ENCOUNTER — Emergency Department: Payer: Self-pay | Admitting: Emergency Medicine

## 2014-09-13 MED ORDER — IPRATROPIUM-ALBUTEROL 20-100 MCG/ACT IN AERS: INHALATION_SPRAY | RESPIRATORY_TRACT | Status: DC
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
## 2014-10-31 ENCOUNTER — Other Ambulatory Visit: Payer: Self-pay | Admitting: Family Medicine

## 2014-11-02 NOTE — Telephone Encounter (Signed)
Advised pt as directed below and stated that she saw her PCP last week. I looked in her chart and did not see a recent OV here at [facility]. Lenward Able, CMA.

## 2014-11-02 NOTE — Telephone Encounter (Signed)
Please let patient know I am refilling a second time but she is well overdue for PCP follow up - last visit was 02/2012. Hilton Sinclair, MD

## 2014-11-07 IMAGING — CT CT HEAD WITHOUT CONTRAST
1 series · 16 of 30 positions shown, 20 images · non-contrast
Comparison: 01/11/2013

CLINICAL DATA: Fall.  Dizziness.  Weakness.  Seizure.

EXAM:
CT HEAD WITHOUT CONTRAST
TECHNIQUE: Contiguous axial images were obtained from the base of the skull
through the vertex without intravenous contrast.

[Series 2: head wo · axial · 0.43mm/px · z∈[+386,+523]mm · 16 of 30 slices shown, 20 images]
[im 2/30  brain]
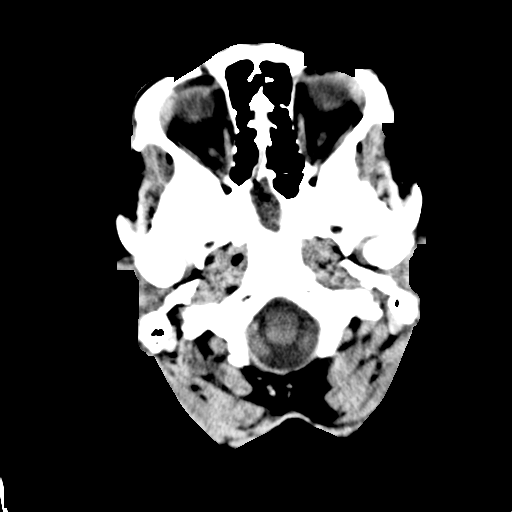
[im 2/30  bone]
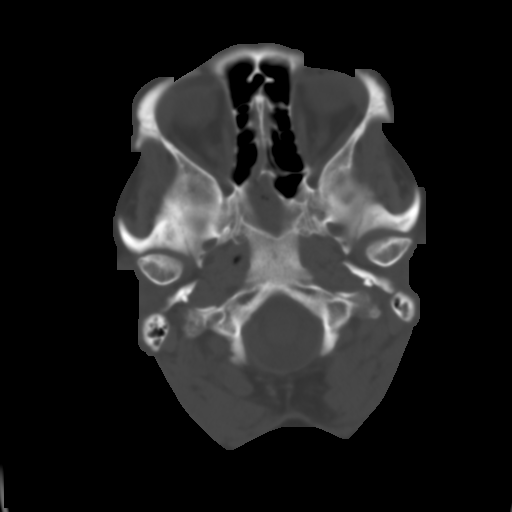
[im 4/30  brain]
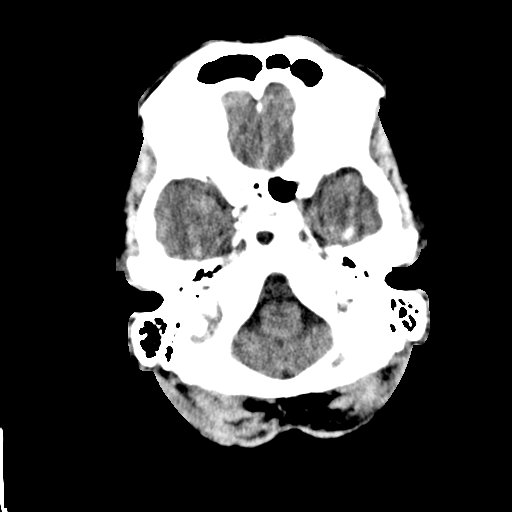
[im 6/30  brain]
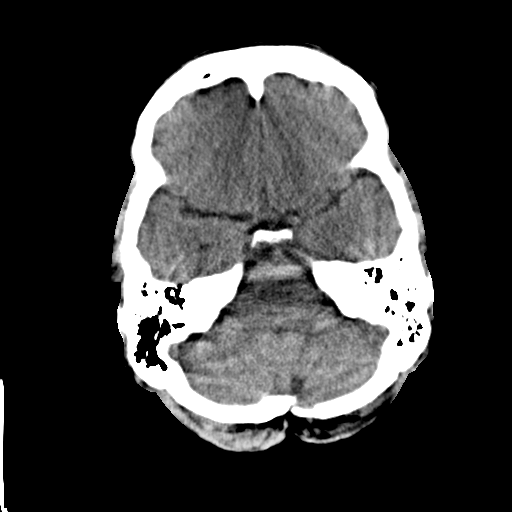
[im 8/30  brain]
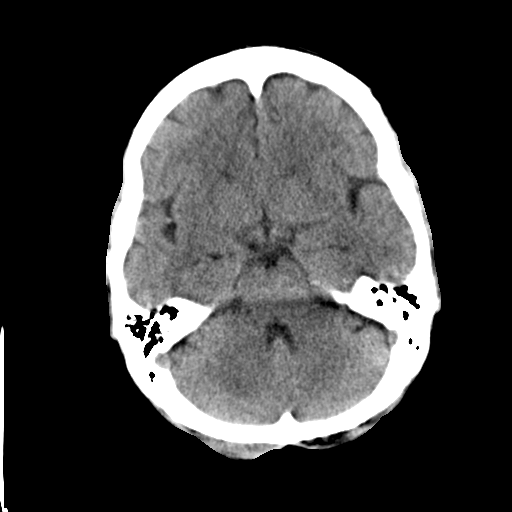
[im 9/30  brain]
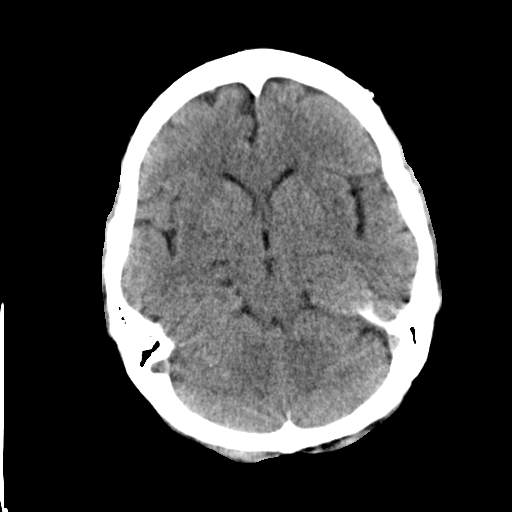
[im 9/30  bone]
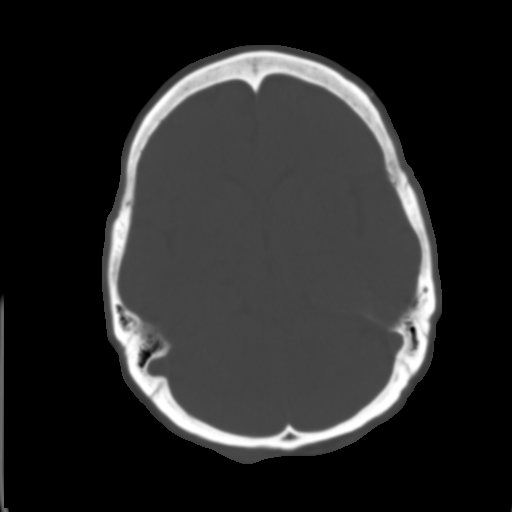
[im 11/30  brain]
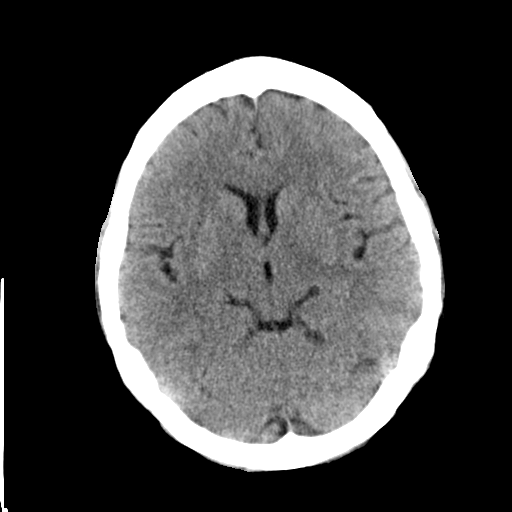
[im 13/30  brain]
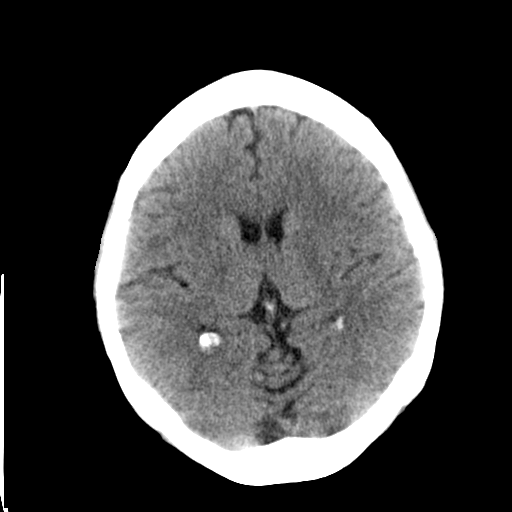
[im 15/30  brain]
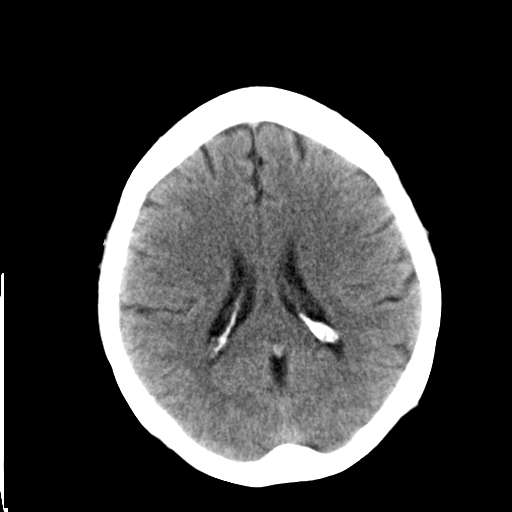
[im 16/30  brain]
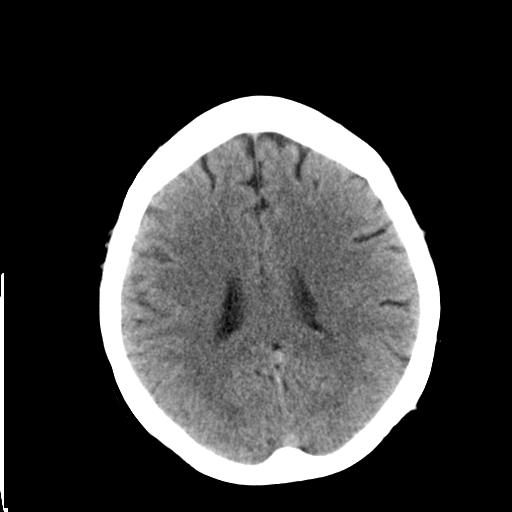
[im 16/30  bone]
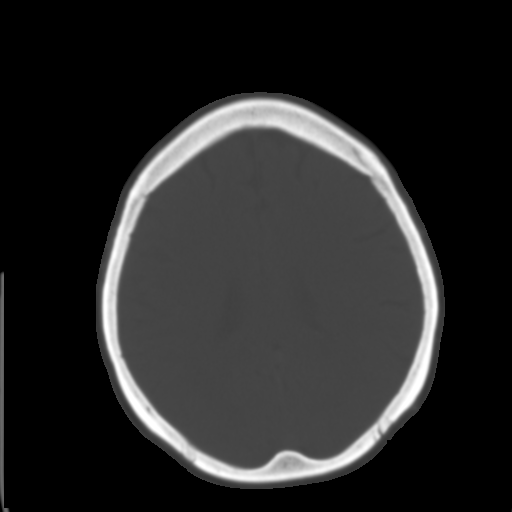
[im 18/30  brain]
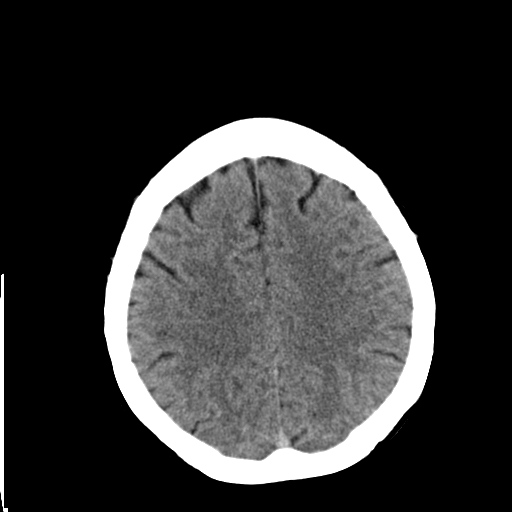
[im 20/30  brain]
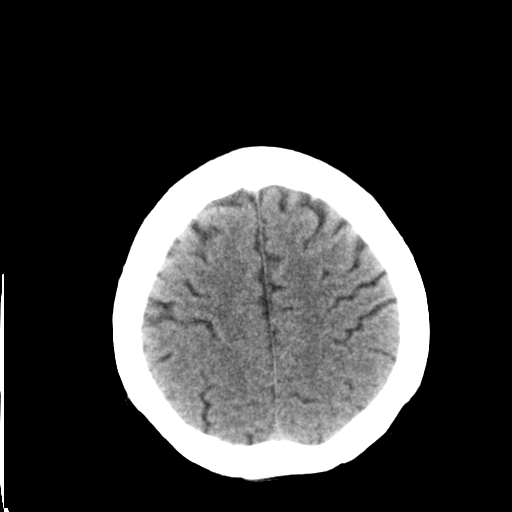
[im 22/30  brain]
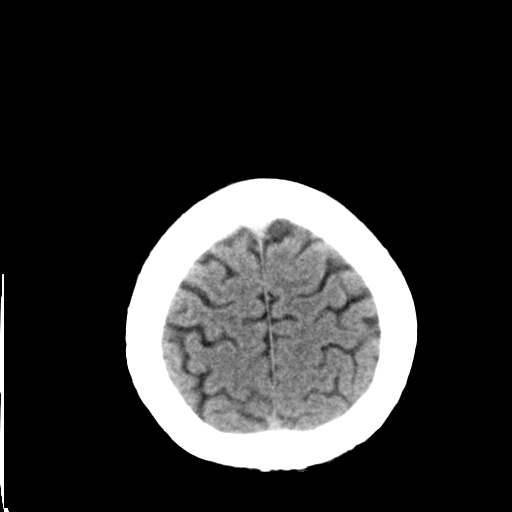
[im 23/30  brain]
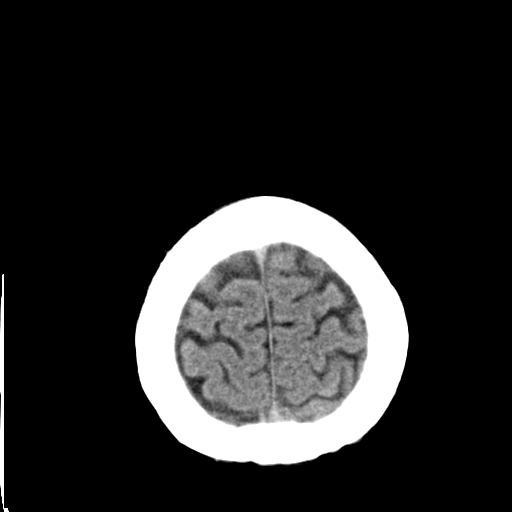
[im 23/30  bone]
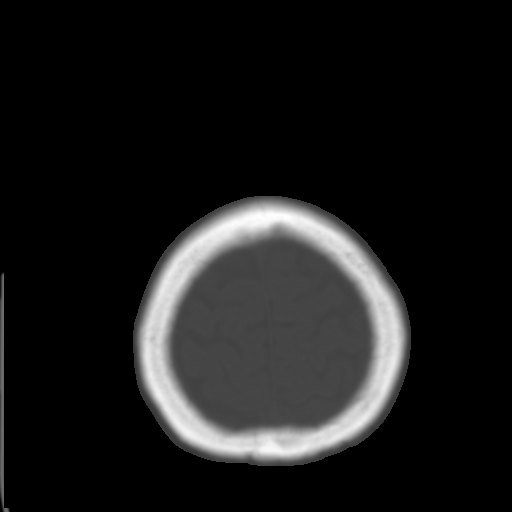
[im 25/30  brain]
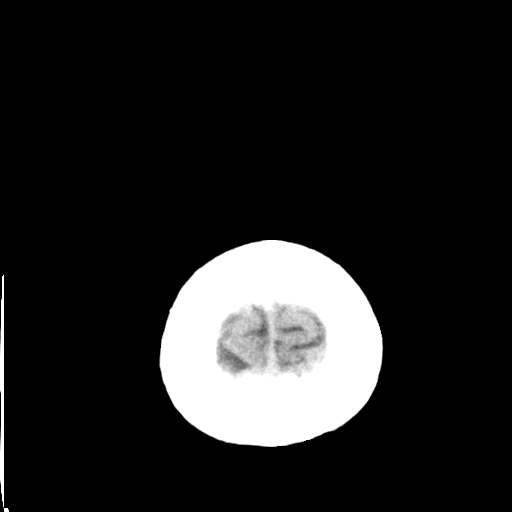
[im 27/30  brain]
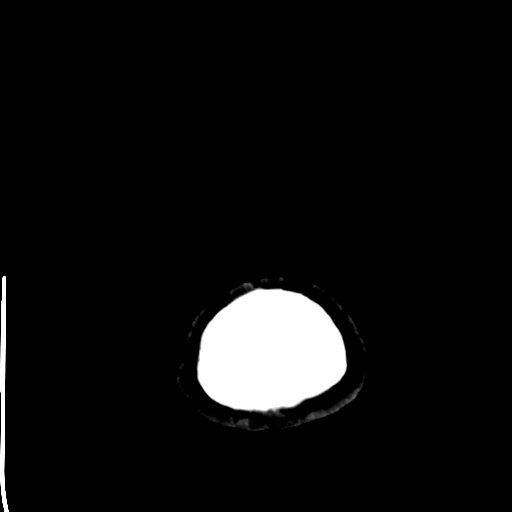
[im 29/30  brain]
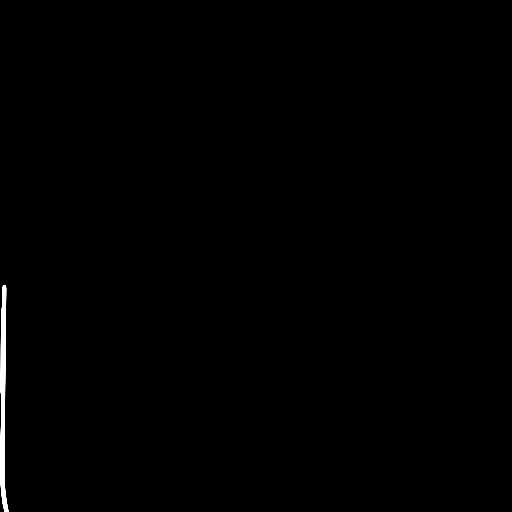

[16 of 30 positions shown; findings below may reference images not displayed]

FINDINGS: No evidence of intracranial hemorrhage, brain edema, or other signs
of acute infarction. No evidence of intracranial mass lesion or mass
effect. No abnormal extraaxial fluid collections identified.
Ventricles are normal in size. No skull abnormality identified. Near
complete opacification of the sphenoid sinus is seen which is new
since previous study.
IMPRESSION: No evidence of intracranial abnormality.

Sphenoid sinus disease, which is new since previous study.

## 2014-11-14 ENCOUNTER — Ambulatory Visit: Payer: Self-pay | Admitting: Family Medicine

## 2014-12-18 NOTE — Consult Note (Signed)
Impression: 54yo female w/ h/o COPD, hep C infection, CRI and Methacillin Resistant Staph aureus skin lesions admitted with COPD exacerbation with pneumonia.  She states that she is feeling better since admission, but is very tachypneic and has significant wheezing.  She has been weaned down to high flow [place]. Agree with broad spectrum antibiotics.  She is on azithro, meropenem and vanco.  There is evidence that linezolid may be better for Methacillin Resistant Staph aureus pneumonia than vanco in critically ill paitents.  Will change her vanco to linezolid.  Continue other antibiotics. Will try to get a sputum culture to help narrow antibiotics. Will send Legionella Ag and serologies for Chlamydia and Mycoplasma. Will send HIV serology. She remains critically ill and has a moderately high risk for respiratory failure and death. Continue contact isoalation for prior Methacillin Resistant Staph aureus.   Electronic Signatures: Kyrese Gartman, Heinz Knuckles (MD) (Signed on 17-Dec-13 21:14)  Authored   Last Updated: 17-Dec-13 21:30 by Monnica Saltsman, Heinz Knuckles (MD)

## 2014-12-18 NOTE — Consult Note (Signed)
Brief Consult Note: Diagnosis: Bipolar afective disorder, anxiety d/o NOS, benzodiazepine dependence.   Patient was seen by consultant.   Consult note dictated.   Recommend further assessment or treatment.   Orders entered.   Comments: Jamie Schultz has a h/o depression, anxiety, mood instability and benzodiazepine dependence. She waas admiited for pneumonia.   PLAN: 1. We will restart Xanax 2 mg tid and seroquel 400 mg twice daily.  2. She will continue Neurontin.   3. I will hold valium and soma.   4. I will folow up.  Electronic Signatures: Orson Slick (MD)  (Signed 18-Dec-13 20:10)  Authored: Brief Consult Note   Last Updated: 18-Dec-13 20:10 by Orson Slick (MD)
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
## 2014-12-18 NOTE — Consult Note (Signed)
             Electronic Signatures: Orson Slick (MD)  (Signed 18-Dec-13 19:42)  Authored: Brief Consult Note   Last Updated: 18-Dec-13 19:42 by Orson Slick (MD)

## 2014-12-18 NOTE — Consult Note (Signed)
PATIENT NAME:  Jamie Schultz, Jamie Schultz MR#:  [id number] DATE OF BIRTH:  February 12, 1961  DATE OF CONSULTATION:  08/16/2012  REFERRING PHYSICIAN:  Dr. _____ Gary Fleet PHYSICIAN:  Heinz Knuckles. Nonie Lochner, MD  REASON FOR CONSULTATION: Pneumonia.   HISTORY OF PRESENT ILLNESS:  The patient is a 54 year old female with a past history significant for COPD, chronic hepatitis C infection, bipolar disorder and recurrent MRSA skin infections who was admitted on December 16th with worsening shortness of breath. The patient is a relatively poor historian and appeared fairly ill with shortness of breath, but between she and her fiancee, it appears that she had been having a several day history of worsening shortness of breath. She had been having some fevers, chills, and malaise as well. She had been having significant wheezing and coughing up brownish sputum as well over the last several days. She does not recall any inciting incident; however, her fiancee's mother was recently diagnosed with influenza in a nursing home.  The patient was admitted to the hospital and placed in the CCU due to severe hypoxia, hypotension and respiratory distress. Her white count on admission was 21.0. She was initially given ceftriaxone, azithromycin and vancomycin. Her antibiotics have been changed to azithromycin, meropenem and vancomycin. She continues to have significant wheezing and shortness of breath, but is feeling better than on admission. Her oxygen has been decreased from BiPAP to high flow nasal cannula.  She is currently also receiving Solu-Medrol.   ALLERGIES:  INCLUDE CODEINE, DEPAKOTE, FLEXERIL, KEFLEX, METHADONE, NSAIDs, PENICILLIN, TORADOL AND ULTRAM. She appeared to tolerate the ceftriaxone without difficulty, however.   PAST MEDICAL HISTORY: 1.  COPD.  2.  Hypertension.  3.  Hypercholesterolemia.  4.  Bipolar disorder.  5.  Borderline personality disorder.  6.  Fibromyalgia.  7.  Chronic pain.  8.  Polysubstance abuse.  9.   Anxiety.  10.  Hepatitis C infection.  11.  Chronic renal insufficiency.  12.  Osteoporosis.  13.  Status post surgical disc surgery with placement of hardware.  14.  Bilateral total knee replacement.  15.  Status post hysterectomy.  16.  Recurrent skin lesions with a prior culture positive for MRSA.   SOCIAL HISTORY: She lives with her fiancee. She is a prior smoker but not currently. She has a history of substance abuse including a multiple types of pills but no injecting drug use history.   FAMILY HISTORY:  Positive for lung cancer and stroke.   REVIEW OF SYSTEMS:  GENERAL: Positive fevers, chills, sweats, malaise.  HEENT: Positive headaches. Positive sinus congestion. Positive sore throat. Positive nasal congestion.  NECK: No stiffness. No swollen glands.  RESPIRATORY: Positive cough with shortness of breath, wheezing, sputum production with brownish sputum. CARDIAC:  Some chest pains with deep inspiration. No peripheral edema. No palpitations.  GASTROINTESTINAL: No nausea, vomiting, abdominal pain, no change in her bowels. She has baseline constipation.  GENITOURINARY: No change in her urine.  MUSCULOSKELETAL: She has generalized myalgias and arthralgias. She also has chronic pain in her neck and several joints. These have not been worse.  SKIN: No rashes.  NEUROLOGIC: No focal weakness.  PSYCHIATRIC: No current complaints. All other systems are negative   PHYSICAL EXAMINATION: VITAL SIGNS: T-max 98.6, T-current 98.6, pulse 112, blood pressure 114/54, 93% on high flow nasal cannula.  GENERAL: A 54 year old white female in moderate to severe respiratory distress with difficulty speaking in full sentences.  HEENT: Normocephalic, atraumatic. Pupils reactive to light. Extraocular motion intact. Sclerae, conjunctivae and lids are without  evidence for emboli or petechiae. Oropharynx shows no erythema or exudate. Teeth and gums are in fair condition.  NECK: Supple. Full range of motion.  Midline trachea. No lymphadenopathy. No thyromegaly. CHEST:  Diffuse wheezing bilaterally. She had decreased breath sounds bilaterally. There is no focal consolidation, however.  She was extremely tachypneic.  CARDIAC: Regular rate and rhythm without murmur, rub or gallop.  ABDOMEN: Soft, nontender and nondistended. No hepatosplenomegaly. No hernias noted.  EXTREMITIES: No evidence for tenosynovitis.  SKIN: No rashes. No stigmata of endocarditis, specifically no Janeway lesions or Osler nodes.  NEUROLOGIC: The patient was awake and interactive. She was moving all 4 extremities. She appeared somewhat confused at times but this may have been more related to her tachypnea.  PSYCHIATRIC: Mood and affect appeared normal, although was somewhat difficult to assess due to her extremis.   LABORATORY AND DIAGNOSTIC DATA:  BUN 30, creatinine 1.49, bicarbonate 22, anion gap 7, AST 28, ALT 18, alkaline phosphatase 89, total bilirubin 0.2. White count 22.3 with hemoglobin 11.3, platelet count 247, ANC 21.5. White count on admission was 21.0. Blood cultures from admission show no growth. Rapid influenza testing was negative. A chest x-ray from admission showed a right upper lobe infiltrate. A repeat chest x-ray today showed some clearing of the infiltrate.   IMPRESSION: A 54 year old female with a past history significant for chronic obstructive pulmonary disease, hepatitis C infection, chronic renal insufficiency and methicillin-resistant staphylococcal aureus skin lesions who was admitted with chronic obstructive pulmonary disease exacerbation with pneumonia.   RECOMMENDATIONS: 1.  She states that she is feeling better since admission but is still very tachypneic and has significant wheezing. She has been weaned down to high flow nasal cannula.  2.  I agree with broad-spectrum antibiotics. She is on azithromycin, meropenem and vancomycin. There is evidence that linezolid may be better for MRSA pneumonia than  vancomycin in critically ill patients. We will change her vancomycin to linezolid. We will continue the other antibiotics.  3.  Will try to get a sputum culture to help narrow antibiotics.  4.  We will send legionella antigen and serologies for chlamydia and mycoplasma.  5.  We will send HIV serologies.  6.  She remains critically ill and is at a moderately high risk for respiratory failure and death.  7. Continue contact isolation for prior MRSA.  Approximately 30 minutes were spent providing critical care to this patient. Thank you very much for involving me in this patient's care.     ____________________________ Heinz Knuckles. Lasondra Hodgkins, MD meb:ct D: 08/16/2012 21:31:00 ET T: 08/17/2012 09:45:22 ET JOB#: [id number]  cc: Heinz Knuckles. Kippy Gohman, MD, <Dictator> Quincey Quesinberry E Rubie Ficco MD ELECTRONICALLY SIGNED 08/22/2012 12:18
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
## 2014-12-21 NOTE — Discharge Summary (Signed)
PATIENT NAME:  Jamie Schultz, Jamie Schultz MR#:  [id number] DATE OF BIRTH:  10-28-1960  DATE OF ADMISSION:  08/15/2012 DATE OF DISCHARGE:  08/21/2012  DISCHARGE DIAGNOSES: 1.  Acute respiratory failure.  2.  Sepsis.  3.  Chronic obstructive pulmonary disease exacerbation.  4.  Pneumonia.  5.  Acute renal failure.  6.  Mildly elevated troponins.   DISCHARGE MEDICATIONS: Xanax 2 mg t.i.d., Synthroid 112 mcg daily, Seroquel XR 400 mg b.i.d., Combivent metered-dose inhaler 1 puff q.i.d. p.r.n., gabapentin 600 mg b.i.d., oxybutynin ER 5 mg daily, prednisone 40 mg daily x 2 days then 30 mg daily x 2 days then 20 mg daily x 2 days then 10 mg daily x  2 days, Levaquin 750 mg daily, MSIR 15 mg q. 4 hours p.r.n., Duragesic patch 75 mcg q. 72 hours.   REASON FOR ADMISSION: This is a 54 year old female who complained of shortness of breath. She was found to be hypoxic and started on BiPAP, found to have pneumonia. She was admitted for pneumonia, respiratory failure and septic shock.   HOSPITAL COURSE: 1.  Acute respiratory failure. This is secondary to the pneumonia. She was placed on BiPAP, was weaned off after a couple of days and required no further oxygen treatment after that.  2.  Pneumonia. She was treated with aggressive antibiotics. Sputum culture grew gram-negative rod that at this point could fully be identified.  I spoke with the lab this morning.  They are having to rerun this, but had it narrowed down to possibly Pseudomonas or Klebsiella.  Clinically, I think the Klebsiella would be more the fit.  She had adequate antibiotic coverage here in the hospital but meropenem being one of the antibiotics that was on our oral conversion, I went ahead and elected to go ahead and convert  to Levaquin for p.o. treatment as outpatient.  Will follow up on cultures and if they lead  [place] to any other conclusion, we will change antibiotics as an outpatient.  3.  Sepsis. The underlying cause was treated and sepsis resolved.    4.  Acute renal failure. This was secondary to the septic shock. She was treated with IV fluids and this resolved.  5.  Mildly elevated troponins. This was due to sepsis, no further cardiac work-up was pursued.  6.  Chronic pain. She was placed back pain medication regimen and will be followed up as an outpatient for any adjustment on this.  6.  Bipolar disorder. She was seen by psychiatry, medicines were adjusted and she is tolerating these well. She is also on a steroid taper which may be exacerbating this slightly.  7.  COPD exacerbation. Will continue her on her metered-dose inhaler. She is on the steroid taper, tolerating it well.   DISPOSITION: The patient is discharged in stable condition. She will follow up with [facility] in 1 to 2 weeks.   Time Spent on discharge: 45 minutes.    ____________________________ Baxter Hire, MD jdj:[id number] D: 08/21/2012 14:17:40 ET T: 08/22/2012 10:47:53 ET JOB#: [id number]  cc: Baxter Hire, MD, <Dictator> [place]. Blocker, MD Jolanta B. Bary Leriche, MD   Dr. Verdene Rio MD ELECTRONICALLY SIGNED 08/31/2012 11:27

## 2014-12-21 NOTE — Discharge Summary (Signed)
PATIENT NAME:  Jamie Schultz, Jamie Schultz MR#:  [id number] DATE OF BIRTH:  1961/04/21  DATE OF ADMISSION:  01/11/2013 DATE OF DISCHARGE:  01/17/2013  DISCHARGE DIAGNOSES: 1.  Severe acute renal failure with uremia and flapping tremor with acidosis and hyperkalemia with potassium of 6.2 and acute rhabdomyolysis, now improving after hemodialysis. Could also be due to Bactrim/Mobic/dehydration with poor p.o. intake.  Now creatinine improving with a value of 3.5 on the date of discharge and has a good urinary output.  2.  Metabolic encephalopathy, now resolved, likely secondary to uremia.  Now seems to be at baseline.  3.  Metabolic acidosis from acute renal failure, now resolved.  4.  Bipolar depression/polypharmacy in the setting of medical issues.  Provided Ambien for now for sleep.  Also on amitriptyline and as needed Xanax.  5.  Right leg/thigh swelling, now resolved. No deep vein thrombosis.   SECONDARY DIAGNOSES: 1.  Chronic obstructive pulmonary disease with ongoing tobacco abuse.  2.  Hypertension.  3.  Hyperlipidemia.  4.  Bipolar disorder.  5.  Fibromyalgia.  6.  Borderline personality disorder.  7.  Chronic pain syndrome.  8.  History of polysubstance abuse.  9.  Anxiety.  10.  Hepatitis C.  11.  Osteoporosis.  CONSULTANTS: 1. Vascular surgery, Dr. Lucky Cowboy.  2.  Nephrology, Dr. Candiss Norse.   PROCEDURES/RADIOLOGY:  Right femoral Trialysis catheter with ultrasound guidance on 14th of May by Dr. Delana Meyer.   Inpatient hemodialysis, per nephrology.   Chest x-ray on 14th of May showed findings consistent with atelectasis in the inferior aspect of the right lung. Mild pulmonary vascular congestion and interstitial edema suggesting low-grade CHF.   CT scan of the head without contrast on 14th of May showed no acute intracranial abnormality.   CT scan of the cervical spine without contrast on 14th of May showed postoperative changes. No acute bony abnormalities seen.   Abdominal ultrasound on 15th of  May showed increased echotexture within both kidneys consistent with medical renal disease. No hydronephrosis. Simple-appearing cyst on the upper pole of right kidney. Possible 2 cm hemangioma on the liver near the dome. Gallbladder wall thickening and sludge. No pericholecystic fluid. Mild dilation of common bile duct and intrahepatic duct. Spleen is not enlarged.   Right lower extremity Doppler on 18th of May showed no evidence of DVT.   MAJOR LABORATORY PANEL: Urinalysis on admission showed 6 WBCs, 2+ bacteria.   Urine culture grew 60,000 colonies of Enterococcus faecalis and more than 100,000 colonies of coagulase-negative staph, likely contamination.   HBsAg was negative.   Intact PTH was elevated with a value of 391.   Serum Tylenol level less than 2.  Salicylate level 3.9, which was elevated.   Blood Volatile substance level was within normal limits and was negative.   HISTORY AND SHORT HOSPITAL COURSE: The patient is a 54 year old female with the above-mentioned medical problems who was admitted for severe acute renal failure with symptoms of uremia with flapping tremor, metabolic acidosis, hyperkalemia, and acute rhabdomyolysis.  Nephrology consultation was obtained with Dr. Candiss Norse and emergent hemodialysis was performed after Trialysis catheter was placed by vascular surgery. Please see Dr. Evie Lacks dictated history and physical for further details. The patient was started on hemodialysis per nephrology while in the hospital and she was slowly improving. She felt her Seroquel was a contributor. This was discontinued. The patient was slowly improving with her hemodialysis. Her urine output had increased and her kidney function was slowly improving. Her mental status also improved with  uremia getting better. Her creatinine on admission was 11.7 with sodium of 126 and potassium of 6.2. On the date of discharge, on 20th of May, her creatinine was 3.52 with a sodium of 138 and potassium of 4.9.  She did not have any further acidosis. Her mental status was close to her baseline and on 20th of May she was felt to be overall doing very well and was discharged home in stable condition.   On the date of discharge her vital signs were as follows: Temperature 98.4, heart rate 84 per minute, respirations 20 per minute, blood pressure 136/79 mmHg, and she was saturating 96% on room air.   PERTINENT PHYSICAL EXAMINATION ON THE DATE OF DISCHARGE:  CARDIOVASCULAR: S1, S2 normal. No murmurs, rubs, or gallops.  LUNGS: Clear to auscultation bilaterally. No wheezing, rales, rhonchi, or crepitation.  ABDOMEN: Soft, benign.  NEUROLOGIC: Nonfocal examination. All other physical examination remained at baseline.   DISCHARGE MEDICATIONS: 1.  Gabapentin 800 mg 2 tablets p.o. at bedtime.  2.  Combivent 1 puff inhaled every 6 hours as needed.  3.  Oxycodone 10 mg p.o. 4 times a day.  4.  Promethazine 25 mg p.o. every 6 hours as needed.  5.  Amphetamine/dextroamphetamine 1 tablet p.o. b.i.d.  6.  Hydroxyzine 25 mg 1 tablet p.o. every 6 hours as needed.  7.  Alprazolam 1 mg p.o. every 8 hours as needed.  8.  Amitriptyline 50 mg p.o. at bedtime.   DISCHARGE DIET: Renal diet.   DISCHARGE ACTIVITY: As tolerated.   DISCHARGE INSTRUCTIONS AND FOLLOW-UP:  The patient was instructed to follow up with her primary care physician in 1 to 2 weeks. She will need followup with Dr. Candiss Norse from nephrology in 2 to 4 weeks. She was requested to get basic metabolic panel check in 1 week with results forwarded to Dr. Candiss Norse and her primary care physician for kidney function monitoring.   TOTAL TIME DISCHARGING THIS PATIENT: 55 minutes. ____________________________ Lucina Mellow. Manuella Ghazi, MD vss:sb D: 01/20/2013 14:42:19 ET T: 01/20/2013 16:34:30 ET JOB#: [id number]  cc: Neema Barreira S. Manuella Ghazi, MD, <Dictator> Murlean Iba, MD Primary Care Physician Lucina Mellow [facility] MD ELECTRONICALLY SIGNED 01/21/2013 0:01

## 2014-12-21 NOTE — Op Note (Signed)
PATIENT NAME:  Jamie Schultz, Jamie Schultz MR#:  [id number] DATE OF BIRTH:  [birth year]  DATE OF PROCEDURE:  08/27/2012  PREOPERATIVE DIAGNOSES:  1.  Respiratory failure.  2.  Congestive heart failure.  3.  Pneumonia.  4.  Lack of appropriate intravenous access.  5.  Hypotension.   POSTOPERATIVE DIAGNOSES: 1.  Respiratory failure. 2.  Congestive heart failure. 3.  Pneumonia. 4.  Lack of appropriate intravenous access. 5.  Hypotension.  PROCEDURE PERFORMED: Insertion of left IJ triple lumen catheter with ultrasound guidance.   SURGEON: Katha Cabal, M.D.   DESCRIPTION OF PROCEDURE: The patient was in the Intensive Care Unit positioned supine. She was intubated and sedated and critically ill. She requires multiple parenteral medications to sustain her life.   DESCRIPTION OF PROCEDURE: The patient was prepped and draped in sterile fashion. Ultrasound was placed in a sterile sleeve. Jugular vein was identified. It was echolucent and compressible indicating patency. Image was recorded for the record. Under direct ultrasound visualization after 1% lidocaine has been infiltrated, a Seldinger needle was inserted into the jugular vein with direct visualization. A J-wire was advanced easily. A counter incision was made, dilator was passed over the wire and triple-lumen catheter was fed without difficulty. All        three lumens aspirated and flushed easily. The catheter was secured to the skin of the neck with 2-0 silk and a sterile dressing was applied. The patient tolerated the procedure well and there were no immediate complications.   ____________________________ Katha Cabal, MD ggs:eg D: 08/28/2012 13:41:32 ET T: 08/28/2012 19:12:43 ET JOB#: [id number]  cc: Katha Cabal, MD, <Dictator> Katha Cabal MD ELECTRONICALLY SIGNED 09/06/2012 11:19
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
## 2014-12-21 NOTE — Discharge Summary (Signed)
PATIENT NAME:  Jamie Schultz, SPEAKMAN MR#:  [id number] DATE OF BIRTH:  1960-09-22  DATE OF ADMISSION:  08/26/2012 DATE OF DISCHARGE:  08/31/2012  ADMITTING DIAGNOSIS:  Acute respiratory failure.   DISCHARGE DIAGNOSES:   1.  Acute respiratory failure requiring brief episode of mechanical ventilation secondary to opiate, benzodiazepines as well as Seroquel overuse.  2.  Chronic obstructive pulmonary disease exacerbation.  3.  Recent Streptococcus agalactiae pneumonia, finished Levaquin course.  4.  Suspected congestive heart failure, acute diastolic, with echocardiogram showing normal ejection fraction.  5.  Acute renal failure likely dehydration, resolved on intravenous fluids on admission.  6.  Hyponatremia, resolved on intravenous fluids.  7.  Liver steatosis with hepatic encephalopathy contributing to respiratory failure.  8.  Hypotension due to sedating medications in PCU, resolved. 9.  Bipolar disorder.  10.  Lower extremity neuropathy.  11.  Tobacco abuse, ongoing.  12.  Hypertension. 13.  Hyperlipidemia.  14.  Fibromyalgia.  15.  Borderline personality disorder.  16.  Polysubstance abuse.  17.  Anxiety.  18.  Hepatitis C.  19.  History of chronic kidney disease as well as osteoporosis.   DISCHARGE CONDITION:  Stable.   DISCHARGE MEDICATIONS:  The patient was advised to continue:  1.  Synthroid 112 mcg p.o. daily.  2.  Diazepam 10 mg p.o. at bedtime.  3.  Combivent Respimat CFC free 1 puff 4 times daily as needed.  4.  Oxybutynin 5 mg p.o. daily.  5.  Multivitamins once daily.  6.  Vitamin E 400 units once daily.  7.  Prednisone 50 mg p.o. once then taper by 10 mg daily until stopped.  8.  Seroquel 200 mg p.o. twice daily which is down from the 400 mg p.o. twice daily dose.  9.  Alprazolam 0.5 mg 3 tablets every 8 hours which is down from 4 tablets every 8 hours.  10.  DuoNebs 3 mL 4 times daily.  11.  Lisinopril 5 mg p.o. daily.  12.  Bacitracin, neomycin, polymyxin ointment  topically to affected area twice a day.  13.  Lactulose 30 mL once a day.  14.  The patient was not to take Duragesic or morphine.   HOME OXYGEN:  None.   DIET:  2 g salt, regular consistency.   ACTIVITY LIMITATIONS:  As tolerated.   FOLLOWUP APPOINTMENT:  To clinic in 2 days after discharge and also with psychiatrist. The patient's family is to make an appointment with a psychiatrist of their choice in approximately 1 week after discharge.   CONSULTANTS:  Dr. Raul Del, care management and Dr. Delana Meyer.   RADIOLOGIC STUDIES:  Chest x-ray, PA and lateral, 08/26/2012, showed no focal pneumonia, increased interstitial markings which are not entirely new, but more conspicuous than on the 19th of December which may reflect worsening interstitial edema of cardiac or noncardiac cause superimposed upon COPD. Repeated chest x-ray, portable single view of 08/27/2012, revealed good position of ET tube and central line, cannot exclude mild congestive heart failure with interstitial pulmonary edema versus pneumonitis which are new since 08/26/2012. Repeated chest x-ray on 08/28/2012, portable single view, ET tube with the tip projecting in the region of proximal portion of bronchus, retraction of this tube approximately 1.5 to 2 cm was recommended. Repeat chest x-ray, portable single view 08/28/2012, revealed obstruction of the patient's ET tube. Otherwise, no significant changes were noted. Portable single view on 08/29/2012, showed findings which are likely secondary to asymmetrical edema, infection is not excluded. Lung V/Q scan on 08/26/2012  revealed undetermined scan for pulmonary embolus. Lower extremity ultrasound, bilateral, on 08/26/2012 showed no evidence of thrombus within the right or left femoral or popliteal veins. Ultrasound of abdomen, general survey, on 08/28/2012, showed hepatic steatosis and sludge within the gallbladder. Echocardiogram done on 08/27/2012 revealed left ventricle ejection fraction  of more than 55%. Right ventricular systolic function is normal. Right ventricular systolic pressures are elevated at 30 to 40 mmHg, dilated IVF  noted.   HISTORY OF PRESENT ILLNESS:  The patient is a 54 year old Caucasian female with history of polysubstance abuse who was admitted to the hospital and discharged on antibiotic therapy as well as some pain medications to home, presented back to the hospital just few days after being discharged on 08/21/2012. She presented somewhat somnolent with lower extremity edema as well as pain and erythema of lower extremities. Please refer to Dr. James Ivanoff' admission note on 08/26/2012. On arrival to the hospital, the patient's blood pressure was 128/82, pulse 111, respiration rate 26, temperature is 98.7, and oxygen saturations were low in the 90s on 2 liters of oxygen; however, it dropped down to 70s on room air. She was also noted to be somewhat lethargic. She was diagnosed with acute respiratory failure and was admitted to the hospital. She was initiated on antibiotics for possible healthcare-associated pneumonia with aztreonam as well as Levaquin and vancomycin. She was also noted to have lower extremity edema and possible lower extremity cellulitis. Another concern was also possible CHF. The patient was admitted to the hospital. She deteriorated and required intubation. She briefly was placed on mechanical ventilation; however, extubated on 08/29/2012. It was felt that the patient's respiratory failure was exacerbated with Xanax and Seroquel, and initially she was managed on broad-spectrum antibiotics; however, because of fluid collection noted on chest x-ray, she was also diuresed. It was felt that the patient's acute respiratory failure was likely due to pneumonitis versus CHF. She was initiated on Lasix and was continued on oxygen therapy. With this dual therapy for possible infection as well diuretics for possible congestive heart failure, her condition  improved and in fact by the day of discharge, 08/31/2012, the patient was on room air again and oxygenation was satisfactory. She was ambulated on room air in the hospital and her oxygen saturation remained stable at 94% on room air, at rest as well as on exertion. It was felt that she is stable to be discharged home to finish her therapy. Her sputum cultures were taken while she was intubated and they did not grow any bacterial pathogens. It was felt that the patient completed her antibiotic course with Levaquin and no antibiotics were prescribed for her.   For congestive heart failure; however, the patient was prescribed an ACE inhibitor as well as diuretics. However, because of concern of inducing again acute renal failure, the patient's diuretics were not continued as an outpatient.   In regards to COPD exacerbation, the patient was advised to continue tapering steroids; however, she finished Levaquin course as mentioned above. The patient's sputum cultures from ET tube showed just slight yeast growth. State lab to process this yeast and identify; no antibiotic therapy was recommended at this time.   The patient was noted to be in acute renal failure on arrival to the hospital initially. The patient's kidney function was noted to be somewhat compromised with creatinine level of 1.39 on 08/26/2012. She was initially given some IV fluids and her creatinine normalized by 08/28/2012. The patient's creatinine is 1.12; however, she was initiated  on diuretic while in the hospital and the patient's kidney function still remains stable.   As mentioned above, she had an echocardiogram done while she was in the hospital which revealed good cardiac function, possible diastolic congestive heart failure. The patient is to continue lisinopril. She may have beta-blockers or calcium channel blockers added as outpatient if her blood pressure tolerates.   The patient was noted to have an elevated ammonia level as well as  elevated liver enzymes. It was felt that the patient has an element of hepatic encephalopathy. She was continued on lactulose while in the hospital and had a good response to it. The patient is to continue lactulose at home as well.   The patient had ultrasound of abdomen done which showed steatosis, but no biliary obstruction. The patient also had Tylenol level checked which was found to be below 2.   In regards to hypotension which was noted while she was intubated and was placed on mechanical ventilation, it was felt that the patient's hypotension was very likely related to medications and likely septic shock. She was briefly on pressors; however, those pressors were weaned off very quickly.   In regards to bipolar disorder, the patient's common-law husband will be getting outpatient psychiatry appointment; however, because of her significant sedation, as well as risks of falls, the patient is to continue lower doses of Seroquel as well as Xanax, and the patient's opiates were suspended and in fact completely discontinued.   The patient was evaluated by a physical therapist while in the hospital and physical therapy recommended home health physical therapy which will be prescribed for her upon discharge. The patient is being discharged in stable condition with the above-mentioned medications and followup.   The patient's vital signs on the day of discharge are temperature 98.4, pulse 85, respiratory rate 17 to 18, blood pressure 152/92 and saturation 94% on room air, at rest as well as on exertion.   TIME SPENT:  40 minutes.    ____________________________ Theodoro Grist, MD rv:si D: 08/31/2012 19:47:00 ET T: 08/31/2012 20:23:48 ET JOB#: [id number]  cc: Theodoro Grist, MD, <Dictator> Saurav Crumble MD ELECTRONICALLY SIGNED 09/25/2012 20:36
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
## 2015-01-31 DIAGNOSIS — M75122 Complete rotator cuff tear or rupture of left shoulder, not specified as traumatic: Secondary | ICD-10-CM | POA: Insufficient documentation

## 2015-03-01 LAB — HM MAMMOGRAPHY: HM Mammogram: NORMAL (ref 0–4)

## 2015-03-06 IMAGING — CT CT PELVIS W/O CM
2 of 3 series · 17 of 46 positions shown, 19 images · non-contrast
Comparison: 01/20/2014

CLINICAL DATA: Rectal prolapse.  Initial encounter.

EXAM:
CT PELVIS WITHOUT CONTRAST
TECHNIQUE: Multidetector CT imaging of the pelvis was performed following the
standard protocol without intravenous contrast.

[Series 2: routine pel with · axial · 0.79mm/px · z∈[-1104,-864]mm · 14 of 56 slices shown, 16 images]
[im 4/56  soft-tissue]
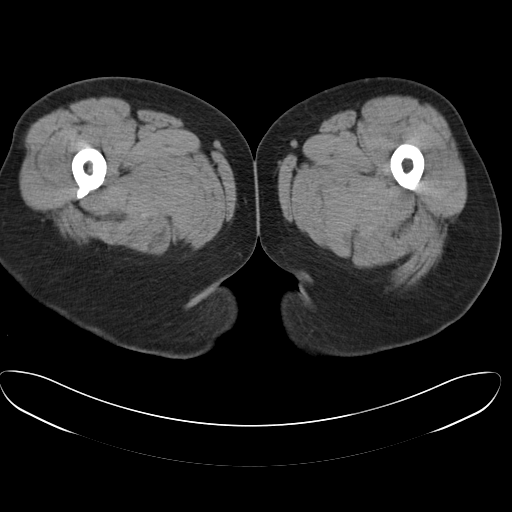
[im 4/56  bone]
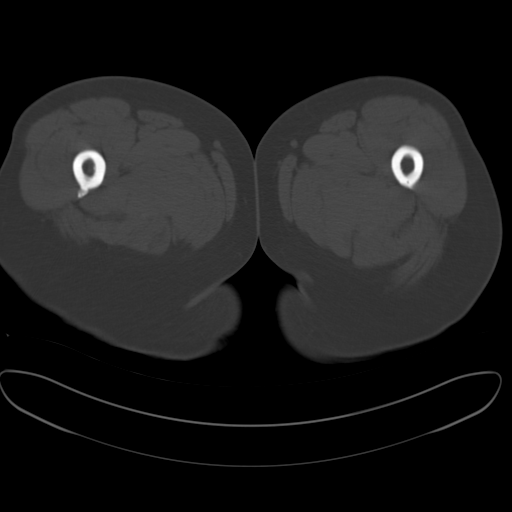
[im 8/56  soft-tissue]
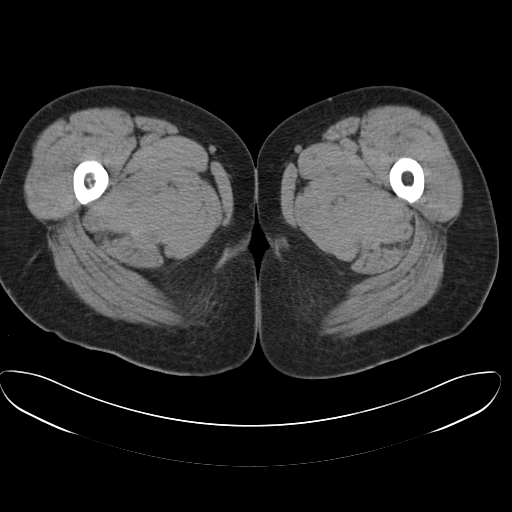
[im 11/56  soft-tissue]
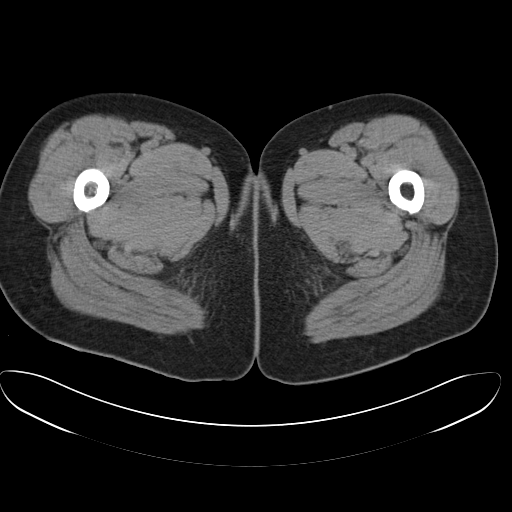
[im 15/56  soft-tissue]
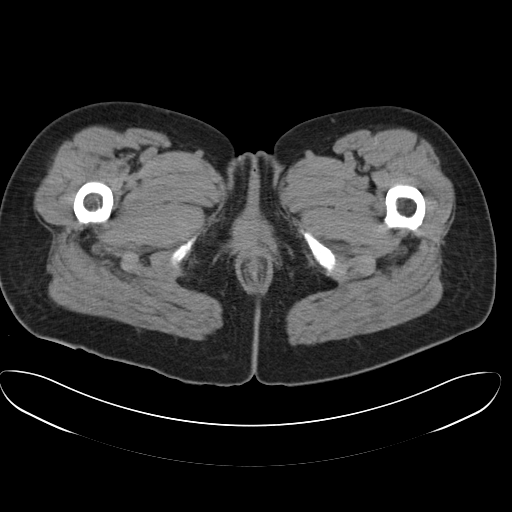
[im 18/56  soft-tissue]
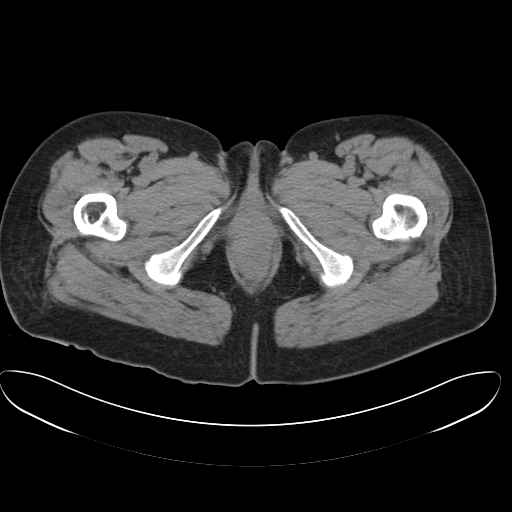
[im 22/56  soft-tissue]
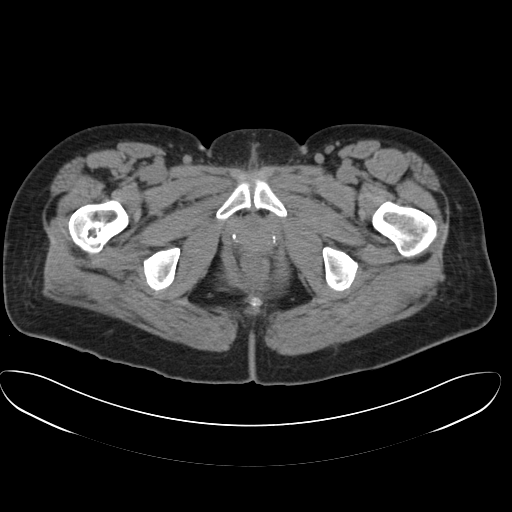
[im 25/56  soft-tissue]
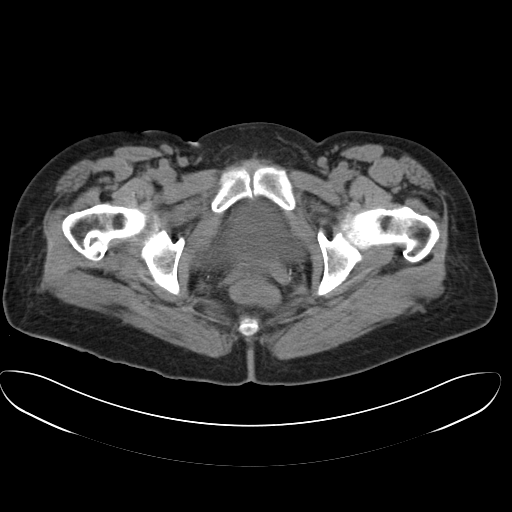
[im 31/56  soft-tissue]
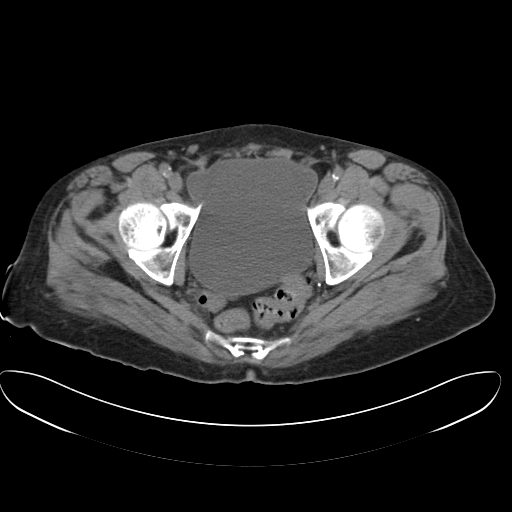
[im 34/56  soft-tissue]
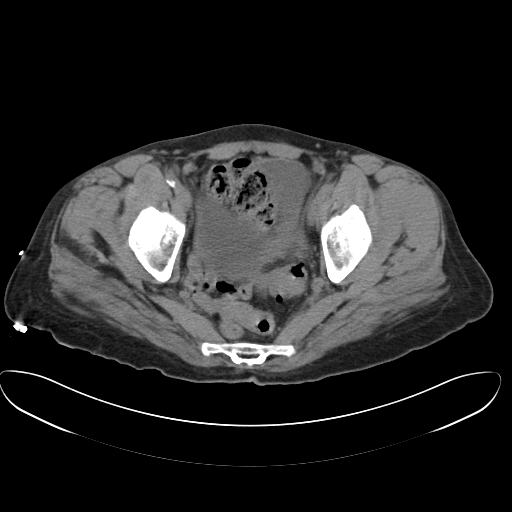
[im 34/56  bone]
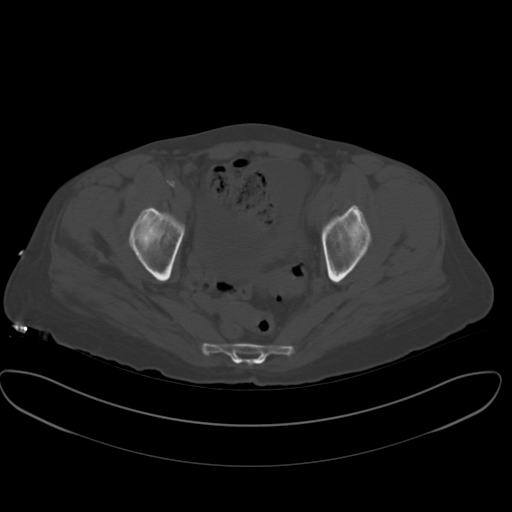
[im 38/56  soft-tissue]
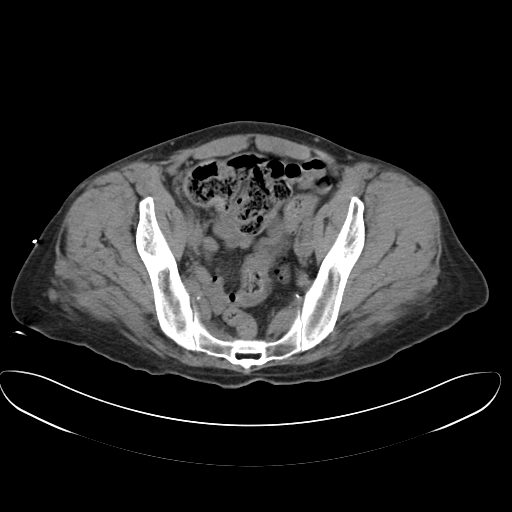
[im 41/56  soft-tissue]
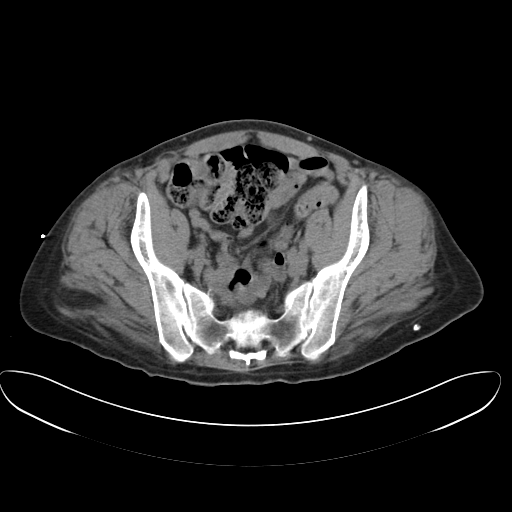
[im 45/56  soft-tissue]
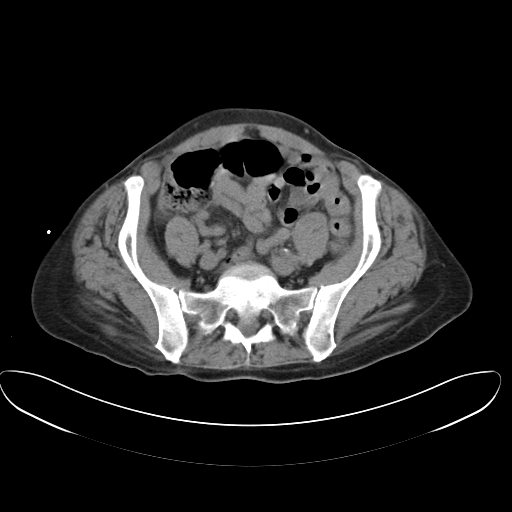
[im 48/56  soft-tissue]
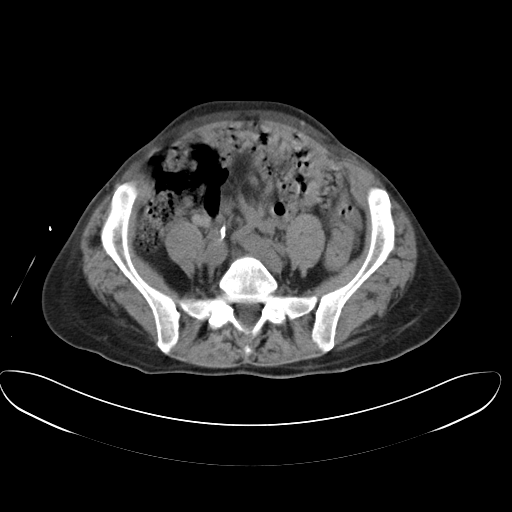
[im 52/56  soft-tissue]
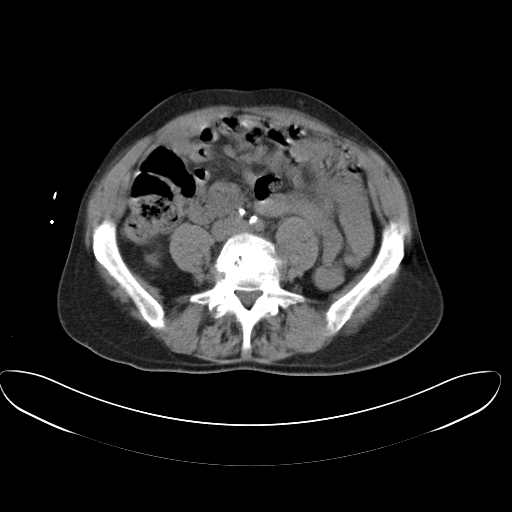

[Series 4: cor routine pel with · coronal · 0.73mm/px · 3 of 111 slices shown]
[im 37/111  soft-tissue]
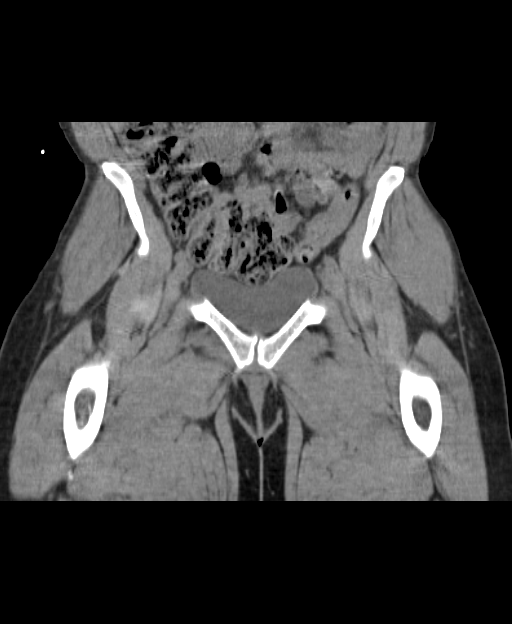
[im 49/111  soft-tissue]
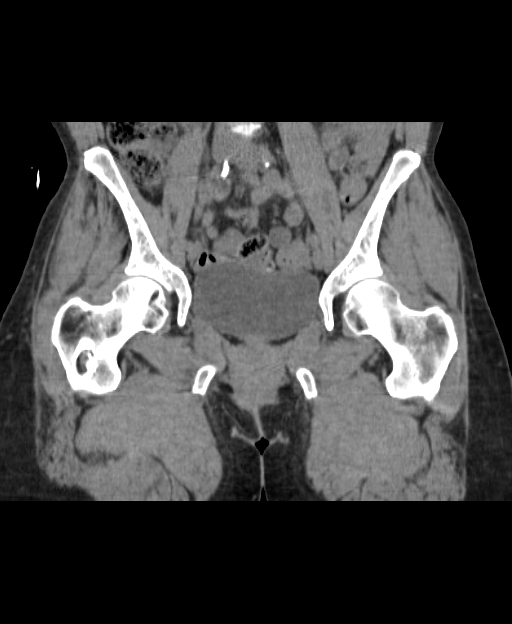
[im 62/111  soft-tissue]
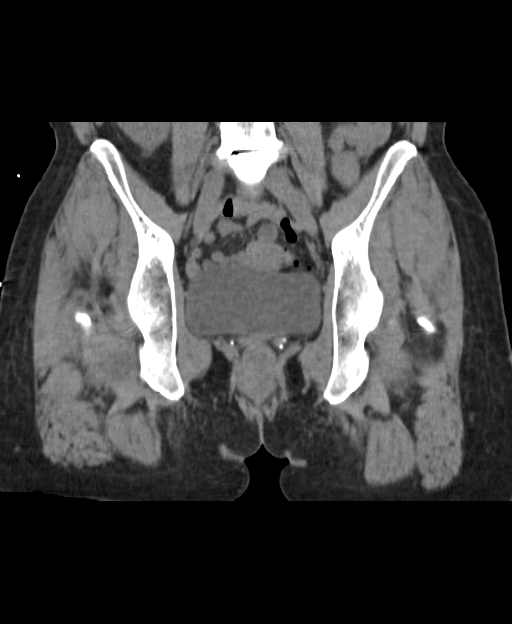

[17 of 46 positions shown; findings below may reference images not displayed]

FINDINGS: Low appearing pelvic floor. No evidence for cystocele or enterocele.
No rectal wall thickening.

There is no evidence of bowel inflammation or obstruction in the
pelvis. Patient status post hysterectomy. No adnexal mass lesions.
Full urinary bladder. Bilaterally the bladder extends towards the
deep inguinal rings, without herniation.

Aortic and branch vessel atherosclerosis.

Evidence of remote intra medullary nail fixation of the right femur.
There is advanced degenerative disc disease at L4-5, with
anterolisthesis noted at this level on previous imaging.
IMPRESSION: Low/ lax pelvic floor.  No rectal prolapse at time of imaging.

## 2015-03-06 IMAGING — CR DG ABDOMEN 1V
1 series · 2 of 2 positions shown · non-contrast
Comparison: Pelvic CT -earlier same day

CLINICAL DATA: Prolapsed rectum.  Subsequent encounter.

EXAM:
ABDOMEN - 1 VIEW

[Series 1: t abdomen supine · 0.14mm/px · 2 of 2 slices shown]
[im 1/2]
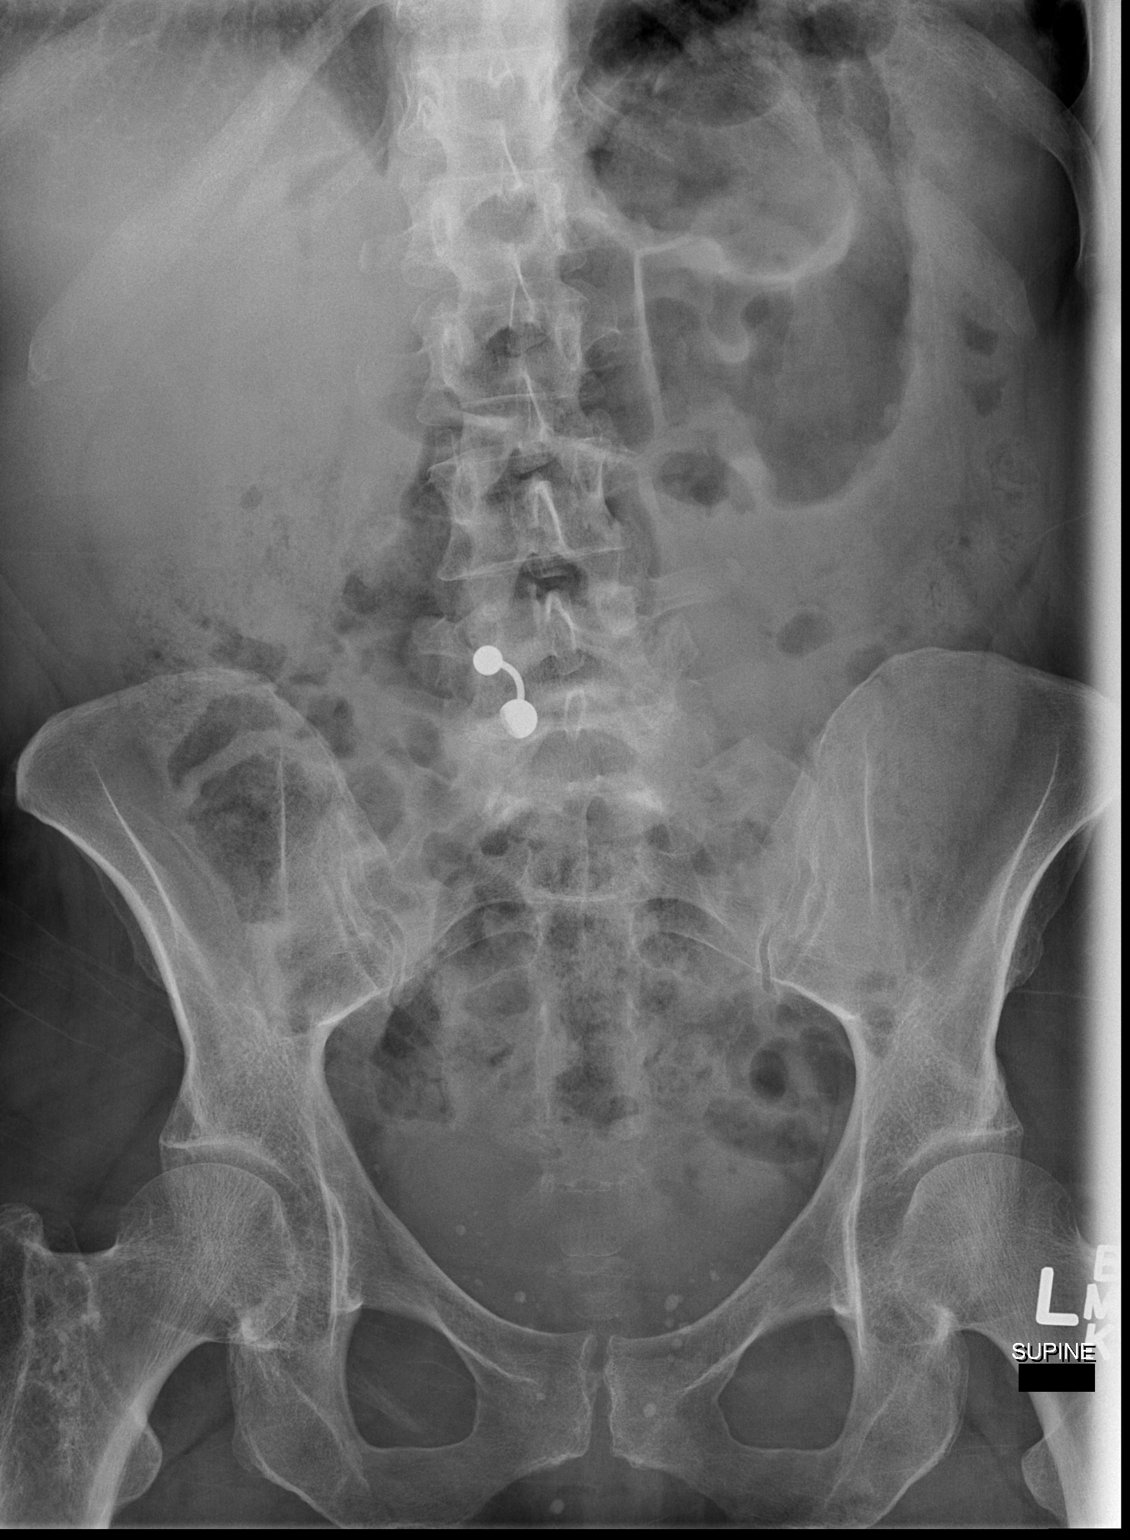
[im 2/2]
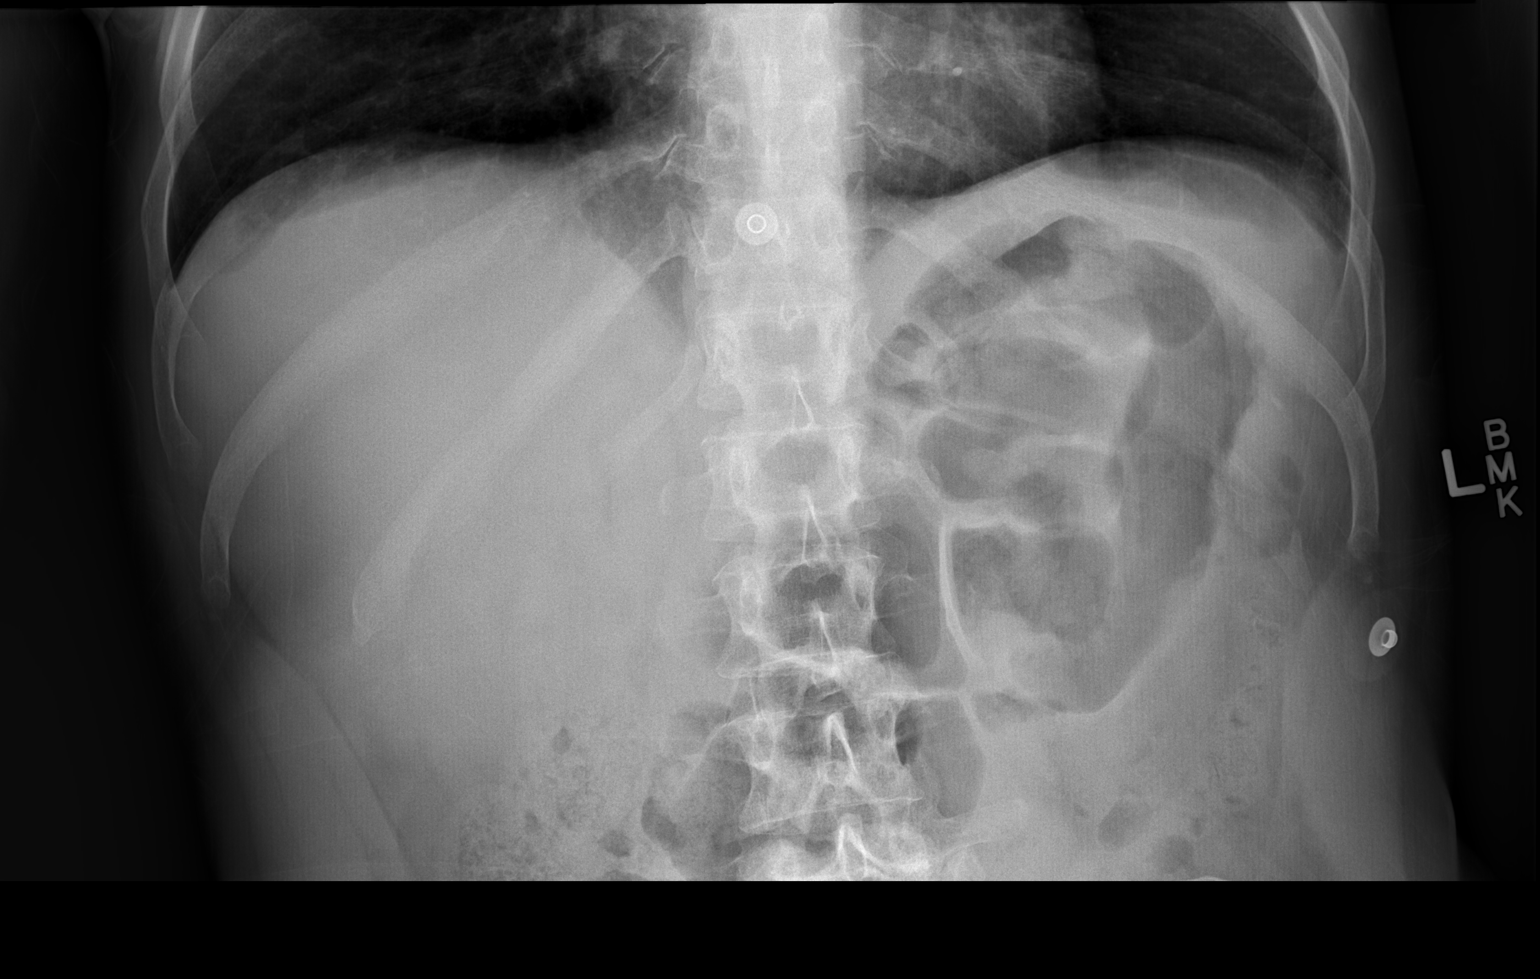

[2 of 2 positions shown; findings below may reference images not displayed]

FINDINGS: Moderate colonic stool burden without evidence of obstruction. No
supine evidence of pneumoperitoneum. No pneumatosis or portal venous
gas.

Multiple phleboliths overlie the lower pelvis bilaterally.
Otherwise, no definite abnormal intra-abdominal calcifications.

Suspected removal of hardware involving the proximal aspect of the
right femur as was suggested on preceding pelvic CT.
IMPRESSION: Moderate colonic stool burden without evidence of obstruction

## 2015-03-14 ENCOUNTER — Encounter: Payer: Self-pay | Admitting: *Deleted

## 2015-03-14 ENCOUNTER — Emergency Department
Admission: EM | Admit: 2015-03-14 | Discharge: 2015-03-14 | Disposition: A | Discharge status: Home / Self Care | Payer: Medicaid Other | Attending: Emergency Medicine | Admitting: Emergency Medicine

## 2015-03-14 DIAGNOSIS — K130 Diseases of lips: Secondary | ICD-10-CM | POA: Insufficient documentation

## 2015-03-14 DIAGNOSIS — L0291 Cutaneous abscess, unspecified: Secondary | ICD-10-CM

## 2015-03-14 DIAGNOSIS — Z880 Allergy status to penicillin: Secondary | ICD-10-CM | POA: Insufficient documentation

## 2015-03-14 DIAGNOSIS — Z79899 Other long term (current) drug therapy: Secondary | ICD-10-CM | POA: Diagnosis not present

## 2015-03-14 DIAGNOSIS — Z720 Tobacco use: Secondary | ICD-10-CM | POA: Insufficient documentation

## 2015-03-14 MED ORDER — DOXYCYCLINE HYCLATE 100 MG PO TABS: 100.0000 mg | ORAL_TABLET | Freq: Two times a day (BID) | ORAL | Status: DC

## 2015-03-14 MED ORDER — LIDOCAINE HCL (PF) 1 % IJ SOLN
5.0000 mL | Freq: Once | INTRAMUSCULAR | Status: DC
  Filled 2015-03-14: qty 5

## 2015-03-14 MED ORDER — DOXYCYCLINE HYCLATE 100 MG PO TABS
100.0000 mg | ORAL_TABLET | Freq: Once | ORAL | Status: AC
  Administered 2015-03-14: 100 mg via ORAL
  Filled 2015-03-14: qty 1

## 2015-03-14 MED ORDER — OXYCODONE HCL 5 MG PO TABS: 5.0000 mg | ORAL_TABLET | Freq: Three times a day (TID) | ORAL | Status: DC | PRN

## 2015-03-14 MED ORDER — MUPIROCIN 2 % EX OINT: TOPICAL_OINTMENT | CUTANEOUS | Status: DC

## 2015-03-14 MED ORDER — HYDROMORPHONE HCL 1 MG/ML IJ SOLN
1.0000 mg | Freq: Once | INTRAMUSCULAR | Status: AC
  Administered 2015-03-14: 1 mg via INTRAMUSCULAR
  Filled 2015-03-14: qty 1

## 2015-03-14 MED ORDER — ONDANSETRON 4 MG PO TBDP
4.0000 mg | ORAL_TABLET | Freq: Once | ORAL | Status: AC
  Administered 2015-03-14: 4 mg via ORAL
  Filled 2015-03-14: qty 1

## 2015-03-14 NOTE — Discharge Instructions (Signed)
Abscess An abscess is an infected area that contains a collection of pus and debris.It can occur in almost any part of the body. An abscess is also known as a furuncle or boil. CAUSES  An abscess occurs when tissue gets infected. This can occur from blockage of oil or sweat glands, infection of hair follicles, or a minor injury to the skin. As the body tries to fight the infection, pus collects in the area and creates pressure under the skin. This pressure causes pain. People with weakened immune systems have difficulty fighting infections and get certain abscesses more often.  SYMPTOMS Usually an abscess develops on the skin and becomes a painful mass that is red, warm, and tender. If the abscess forms under the skin, you may feel a moveable soft area under the skin. Some abscesses break open (rupture) on their own, but most will continue to get worse without care. The infection can spread deeper into the body and eventually into the bloodstream, causing you to feel ill.  DIAGNOSIS  Your caregiver will take your medical history and perform a physical exam. A sample of fluid may also be taken from the abscess to determine what is causing your infection. TREATMENT  Your caregiver may prescribe antibiotic medicines to fight the infection. However, taking antibiotics alone usually does not cure an abscess. Your caregiver may need to make a small cut (incision) in the abscess to drain the pus. In some cases, gauze is packed into the abscess to reduce pain and to continue draining the area. HOME CARE INSTRUCTIONS   Only take over-the-counter or prescription medicines for pain, discomfort, or fever as directed by your caregiver.  If you were prescribed antibiotics, take them as directed. Finish them even if you start to feel better.  If gauze is used, follow your caregiver's directions for changing the gauze.  To avoid spreading the infection:  Keep your draining abscess covered with a  bandage.  Wash your hands well.  Do not share personal care items, towels, or whirlpools with others.  Avoid skin contact with others.  Keep your skin and clothes clean around the abscess.  Keep all follow-up appointments as directed by your caregiver. SEEK MEDICAL CARE IF:   You have increased pain, swelling, redness, fluid drainage, or bleeding.  You have muscle aches, chills, or a general ill feeling.  You have a fever. MAKE SURE YOU:   Understand these instructions.  Will watch your condition.  Will get help right away if you are not doing well or get worse. Document Released: 05/27/2005 Document Revised: 02/16/2012 Document Reviewed: 10/30/2011 [facility] Patient Information 2015 [place], [place]. This information is not intended to replace advice given to you by your health care provider. Make sure you discuss any questions you have with your health care provider.   Take antibiotics and pain medicine as directed and follow up with a physician if not improving. Return to the ER for any concerns, or worsening symptoms.

## 2015-03-14 NOTE — ED Notes (Signed)
Pt reports she woke up with a bump that was sore on her upper lip yesterday, and now the area has become worse and painful
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
## 2015-03-14 NOTE — ED Notes (Signed)
Pt discharged home after verbalizing understanding of discharge instructions; nad noted. 

## 2015-03-28 ENCOUNTER — Encounter: Payer: Self-pay | Admitting: Family Medicine

## 2015-03-28 ENCOUNTER — Ambulatory Visit (INDEPENDENT_AMBULATORY_CARE_PROVIDER_SITE_OTHER): Payer: Medicaid Other | Admitting: Family Medicine

## 2015-03-28 VITALS — BP 126/72 | HR 94 | Temp 98.4°F | Resp 16 | Wt 142.5 lb

## 2015-03-28 DIAGNOSIS — J449 Chronic obstructive pulmonary disease, unspecified: Secondary | ICD-10-CM | POA: Diagnosis not present

## 2015-03-28 DIAGNOSIS — M75102 Unspecified rotator cuff tear or rupture of left shoulder, not specified as traumatic: Secondary | ICD-10-CM

## 2015-03-28 DIAGNOSIS — M549 Dorsalgia, unspecified: Secondary | ICD-10-CM

## 2015-03-28 DIAGNOSIS — M12512 Traumatic arthropathy, left shoulder: Secondary | ICD-10-CM | POA: Diagnosis not present

## 2015-03-28 DIAGNOSIS — G8929 Other chronic pain: Secondary | ICD-10-CM

## 2015-03-28 DIAGNOSIS — M12812 Other specific arthropathies, not elsewhere classified, left shoulder: Secondary | ICD-10-CM

## 2015-03-28 MED ORDER — BUDESONIDE-FORMOTEROL FUMARATE 160-4.5 MCG/ACT IN AERO: 2.0000 | INHALATION_SPRAY | Freq: Two times a day (BID) | RESPIRATORY_TRACT | Status: DC

## 2015-03-28 MED ORDER — PREDNISONE 10 MG (21) PO TBPK: ORAL_TABLET | ORAL | Status: DC

## 2015-03-28 MED ORDER — IPRATROPIUM-ALBUTEROL 20-100 MCG/ACT IN AERS: 1.0000 | INHALATION_SPRAY | Freq: Four times a day (QID) | RESPIRATORY_TRACT | Status: DC | PRN

## 2015-03-28 MED ORDER — CARISOPRODOL 350 MG PO TABS: 350.0000 mg | ORAL_TABLET | Freq: Three times a day (TID) | ORAL | Status: DC | PRN
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
## 2015-03-29 ENCOUNTER — Other Ambulatory Visit: Payer: Self-pay | Admitting: Family Medicine

## 2015-03-29 DIAGNOSIS — J439 Emphysema, unspecified: Secondary | ICD-10-CM

## 2015-03-29 MED ORDER — FLUTICASONE FUROATE-VILANTEROL 100-25 MCG/INH IN AEPB: 1.0000 | INHALATION_SPRAY | Freq: Every day | RESPIRATORY_TRACT | Status: DC

## 2015-03-29 NOTE — Telephone Encounter (Signed)
Computer and power went out when I was trying to respond to this message but here is the completion:  I have sent Breo prescription to her pharmacy. I do not prescribe or manage chronic pain medications. She will have to wait for pain management.   Expand All Collapse All   I have sent in a prescription for Breo instead of the Symbicort I sent in earlier. If it is not cost effective have her let me know with a call. She will have to wait for pain management to discuss starting controled substance pain medication as she should recall when she s

## 2015-03-29 NOTE — Telephone Encounter (Signed)
Patient called to check status on her referral to the pain clinic. I informed her according to the notes that the referral can not be placed until she receive her new medicaid card with our name on it. She understood and states she should receive it in August. She is requesting one prescription for pain medication. She also would like to inform you that she really likes the [facility] [phone]

## 2015-03-31 ENCOUNTER — Other Ambulatory Visit: Payer: Self-pay | Admitting: Surgery

## 2015-04-01 ENCOUNTER — Telehealth: Payer: Self-pay | Admitting: Family Medicine

## 2015-04-01 ENCOUNTER — Other Ambulatory Visit: Payer: Self-pay | Admitting: Family Medicine

## 2015-04-01 DIAGNOSIS — K5909 Other constipation: Secondary | ICD-10-CM

## 2015-04-01 MED ORDER — POLYETHYLENE GLYCOL 3350 17 GM/SCOOP PO POWD: 17.0000 g | Freq: Every day | ORAL | Status: DC

## 2015-04-01 NOTE — Telephone Encounter (Signed)
Patient was informed and stated that she will call right now to have her medicaid card switched to this office. Patient stated that she was in a lot of pain and just wanted a one time rx until she can get her injection. Patient stated that her shoulder was very painful and really needed to get in the pain clinic asap. Patient was informed that we could not send her anywhere until she has the card switched. She stated ok.

## 2015-04-01 NOTE — Telephone Encounter (Signed)
PT IS ASKING FOR PAIN MEDICATION FOR 7 DAYS UNTIL SHE GETS THE INJECTION. SHE NEEDS HER MURILAX ALSO.  IF CALLED IN SHE CAN GET A BIG JUG WHICH IS SO MUCH CHEAPER.

## 2015-04-01 NOTE — Telephone Encounter (Signed)
Miralax for constipation sent to pharmacy. Pain medication request denied.

## 2015-04-03 ENCOUNTER — Emergency Department
Admission: EM | Admit: 2015-04-03 | Discharge: 2015-04-03 | Disposition: A | Discharge status: Home / Self Care | Payer: Medicaid Other | Attending: Emergency Medicine | Admitting: Emergency Medicine

## 2015-04-03 ENCOUNTER — Encounter: Payer: Self-pay | Admitting: Emergency Medicine

## 2015-04-03 DIAGNOSIS — K623 Rectal prolapse: Secondary | ICD-10-CM | POA: Diagnosis not present

## 2015-04-03 DIAGNOSIS — Z880 Allergy status to penicillin: Secondary | ICD-10-CM | POA: Insufficient documentation

## 2015-04-03 DIAGNOSIS — Z7951 Long term (current) use of inhaled steroids: Secondary | ICD-10-CM | POA: Insufficient documentation

## 2015-04-03 DIAGNOSIS — Z720 Tobacco use: Secondary | ICD-10-CM | POA: Diagnosis not present

## 2015-04-03 DIAGNOSIS — Z792 Long term (current) use of antibiotics: Secondary | ICD-10-CM | POA: Diagnosis not present

## 2015-04-03 DIAGNOSIS — G8929 Other chronic pain: Secondary | ICD-10-CM | POA: Insufficient documentation

## 2015-04-03 DIAGNOSIS — R109 Unspecified abdominal pain: Secondary | ICD-10-CM | POA: Diagnosis present

## 2015-04-03 DIAGNOSIS — Z79899 Other long term (current) drug therapy: Secondary | ICD-10-CM | POA: Insufficient documentation

## 2015-04-03 LAB — URINALYSIS COMPLETE WITH MICROSCOPIC (ARMC ONLY)
BACTERIA UA: NONE SEEN
BILIRUBIN URINE: NEGATIVE
Glucose, UA: NEGATIVE mg/dL
Hgb urine dipstick: NEGATIVE
KETONES UR: NEGATIVE mg/dL
LEUKOCYTES UA: NEGATIVE
NITRITE: NEGATIVE
PH: 6 (ref 5.0–8.0)
Protein, ur: NEGATIVE mg/dL
Specific Gravity, Urine: 1.016 (ref 1.005–1.030)

## 2015-04-03 LAB — COMPREHENSIVE METABOLIC PANEL
ALK PHOS: 72 U/L (ref 38–126)
ALT: 24 U/L (ref 14–54)
ANION GAP: 5 (ref 5–15)
AST: 25 U/L (ref 15–41)
Albumin: 3.6 g/dL (ref 3.5–5.0)
BUN: 18 mg/dL (ref 6–20)
CALCIUM: 9.6 mg/dL (ref 8.9–10.3)
CHLORIDE: 110 mmol/L (ref 101–111)
CO2: 27 mmol/L (ref 22–32)
Creatinine, Ser: 0.87 mg/dL (ref 0.44–1.00)
GFR calc non Af Amer: >60 mL/min (ref >60)
Glucose, Bld: 89 mg/dL (ref 65–99)
Potassium: 4.3 mmol/L (ref 3.5–5.1)
SODIUM: 142 mmol/L (ref 135–145)
Total Bilirubin: 0.3 mg/dL (ref 0.3–1.2)
Total Protein: 6.4 g/dL — ABNORMAL LOW (ref 6.5–8.1)

## 2015-04-03 LAB — CBC
HEMATOCRIT: 38.1 % (ref 35.0–47.0)
Hemoglobin: 12.4 g/dL (ref 12.0–16.0)
MCH: 30.5 pg (ref 26.0–34.0)
MCHC: 32.5 g/dL (ref 32.0–36.0)
MCV: 94 fL (ref 80.0–100.0)
Platelets: 268 10*3/uL (ref 150–440)
RBC: 4.05 MIL/uL (ref 3.80–5.20)
RDW: 14.4 % (ref 11.5–14.5)
WBC: 11.4 10*3/uL — AB (ref 3.6–11.0)

## 2015-04-03 LAB — LIPASE, BLOOD: Lipase: 30 U/L (ref 22–51)

## 2015-04-03 MED ORDER — HYDROMORPHONE HCL 1 MG/ML IJ SOLN
2.0000 mg | Freq: Once | INTRAMUSCULAR | Status: AC
Start: 2015-04-03 — End: 2015-04-03
  Administered 2015-04-03: 2 mg via INTRAMUSCULAR

## 2015-04-03 MED ORDER — HYDROMORPHONE HCL 1 MG/ML IJ SOLN
INTRAMUSCULAR | Status: AC
  Filled 2015-04-03: qty 2

## 2015-04-03 NOTE — ED Notes (Signed)
Pt states my rectum is prolapsed.  Pt states my stomach is so bloated.  Pt requesting pain meds.  md at bedside to examine.  Pt tearful at times.  Spouse at bedside.

## 2015-04-03 NOTE — ED Notes (Signed)
meds given.   Pt discharged after meds given per dr brown order.  Husband with pt
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
## 2015-04-03 NOTE — Discharge Instructions (Signed)

## 2015-04-03 NOTE — ED Notes (Signed)
Patient ambulatory to triage with steady gait, without difficulty or distress noted; pt st hx prolapsed rectum; has been having leakage from rectum with lower abd pain today, stating "something has moved, it's like there's a little cup in there and it's letting it out"

## 2015-04-04 ENCOUNTER — Telehealth: Payer: Self-pay

## 2015-04-04 NOTE — Telephone Encounter (Signed)
Patient called nurse after hours for prolapsed rectum and having leakage and pain.  Nurse advised patient to got to ER. Contacted patient today and she did go to the ER and will schedule a f/u appointment.

## 2015-04-05 ENCOUNTER — Telehealth: Payer: Self-pay

## 2015-04-05 NOTE — Telephone Encounter (Signed)
Patient called stating that her rectum is leaking and she is having a hard time breathing. Patient stated that she went to the ER and the doctor told her to roll over on her side and she told him that he was not going to stick his finger in her, so he just looked and stated that nothing was prolasped. He gave her a shot of dilaudid and told her to go home. She stated that her abdomen is swollen along with her hands and feet. She stated that her hemorrhoids is protruding and will not go back in. Patient stated that she is hurting really bad and has not slept in 2 days. I told her that she will probably need to see a G.I. And she told me that she has already seen one. I then stressed to her that we were only primary care and can only do but som much but that I will pass this message on to Dr. Nadine Counts.

## 2015-04-05 NOTE — Telephone Encounter (Signed)
From now on all complaints and concerns from Ms Jamie Schultz will have to be addressed with an office visit for proper evaluation.

## 2015-04-10 ENCOUNTER — Telehealth: Payer: Self-pay | Admitting: Family Medicine

## 2015-04-10 NOTE — Telephone Encounter (Signed)
Patient is checking status on referral to pain clinic. Please return call

## 2015-04-10 NOTE — Telephone Encounter (Signed)
Patient was prescribed breo and the pharmacy told her that it is needing authorization.

## 2015-04-10 NOTE — Telephone Encounter (Signed)
Spoke with Jamie Schultz and notified her that [place] in [place] doesn't take [employer] but, they [facility] in [place] does take Medicaid, she is willing to go to [place]. I also had to call Percell Miller and [place] and give them our NPI # number so she can give get an injection in her shoulder and knee today.

## 2015-04-12 ENCOUNTER — Telehealth: Payer: Self-pay

## 2015-04-12 NOTE — Telephone Encounter (Signed)
Looked on the medicaid formulary and [facility] is Non-Preferred. She'll have to try Advair or Dulera beforehand.

## 2015-04-12 NOTE — Telephone Encounter (Signed)
Contacted this patient to inform her that [facility] was not covered by her insurance so she has to choose Advair or Dulera. Patient stated that she will try the [facility]. 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 She stated, "ill come in for a visit if I have to so we can get the ball rolling."  Patient stated that she got a injection in her shoulder and her knee so her pain has been reduced for now.

## 2015-04-13 NOTE — Telephone Encounter (Signed)
Office visit will be needed to record her needs for the referral.

## 2015-04-15 NOTE — Telephone Encounter (Signed)
Contacted this patient to inform her in order to get her referral, she will need to come in for an appt. Appt was made for 04/19/15 at 11am.

## 2015-04-19 ENCOUNTER — Ambulatory Visit (INDEPENDENT_AMBULATORY_CARE_PROVIDER_SITE_OTHER): Payer: Medicaid Other | Admitting: Family Medicine

## 2015-04-19 ENCOUNTER — Encounter: Payer: Self-pay | Admitting: Family Medicine

## 2015-04-19 VITALS — BP 142/86 | HR 90 | Temp 98.1°F | Resp 18 | Wt 145.9 lb

## 2015-04-19 DIAGNOSIS — K623 Rectal prolapse: Secondary | ICD-10-CM

## 2015-04-19 DIAGNOSIS — B182 Chronic viral hepatitis C: Secondary | ICD-10-CM | POA: Diagnosis not present

## 2015-04-19 DIAGNOSIS — M2550 Pain in unspecified joint: Secondary | ICD-10-CM

## 2015-04-19 DIAGNOSIS — G8929 Other chronic pain: Secondary | ICD-10-CM | POA: Diagnosis not present

## 2015-04-19 DIAGNOSIS — Z23 Encounter for immunization: Secondary | ICD-10-CM

## 2015-04-19 DIAGNOSIS — J439 Emphysema, unspecified: Secondary | ICD-10-CM | POA: Diagnosis not present

## 2015-04-19 MED ORDER — OXYCODONE HCL 10 MG PO TABS: 10.0000 mg | ORAL_TABLET | Freq: Two times a day (BID) | ORAL | Status: DC | PRN
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
## 2015-04-20 DIAGNOSIS — K623 Rectal prolapse: Secondary | ICD-10-CM | POA: Insufficient documentation

## 2015-04-20 LAB — HEPATITIS C ANTIBODY: Hep C Virus Ab: >11 s/co ratio — ABNORMAL HIGH (ref 0.0–0.9)

## 2015-04-20 LAB — COMPREHENSIVE METABOLIC PANEL
ALBUMIN: 4.8 g/dL (ref 3.5–5.5)
ALK PHOS: 102 IU/L (ref 39–117)
ALT: 16 IU/L (ref 0–32)
AST: 15 IU/L (ref 0–40)
Albumin/Globulin Ratio: 1.8 (ref 1.1–2.5)
BILIRUBIN TOTAL: 0.3 mg/dL (ref 0.0–1.2)
BUN/Creatinine Ratio: 14 (ref 9–23)
BUN: 12 mg/dL (ref 6–24)
CO2: 24 mmol/L (ref 18–29)
Calcium: 11 mg/dL — ABNORMAL HIGH (ref 8.7–10.2)
Chloride: 104 mmol/L (ref 97–108)
Creatinine, Ser: 0.88 mg/dL (ref 0.57–1.00)
GFR, EST AFRICAN AMERICAN: 85 mL/min/{1.73_m2} (ref >59)
GFR, EST NON AFRICAN AMERICAN: 74 mL/min/{1.73_m2} (ref >59)
GLUCOSE: 94 mg/dL (ref 65–99)
Globulin, Total: 2.6 g/dL (ref 1.5–4.5)
POTASSIUM: 4.3 mmol/L (ref 3.5–5.2)
Sodium: 143 mmol/L (ref 134–144)
Total Protein: 7.4 g/dL (ref 6.0–8.5)

## 2015-04-20 LAB — CBC WITH DIFFERENTIAL/PLATELET
BASOS ABS: 0.1 10*3/uL (ref 0.0–0.2)
Basos: 1 %
EOS (ABSOLUTE): 0.2 10*3/uL (ref 0.0–0.4)
Eos: 2 %
HEMOGLOBIN: 16.1 g/dL — AB (ref 11.1–15.9)
Hematocrit: 47 % — ABNORMAL HIGH (ref 34.0–46.6)
Immature Grans (Abs): 0 10*3/uL (ref 0.0–0.1)
Immature Granulocytes: 0 %
LYMPHS ABS: 2.1 10*3/uL (ref 0.7–3.1)
Lymphs: 23 %
MCH: 31.8 pg (ref 26.6–33.0)
MCHC: 34.3 g/dL (ref 31.5–35.7)
MCV: 93 fL (ref 79–97)
MONOCYTES: 6 %
MONOS ABS: 0.6 10*3/uL (ref 0.1–0.9)
NEUTROS ABS: 6.2 10*3/uL (ref 1.4–7.0)
Neutrophils: 68 %
Platelets: 298 10*3/uL (ref 150–379)
RBC: 5.07 x10E6/uL (ref 3.77–5.28)
RDW: 14.5 % (ref 12.3–15.4)
WBC: 9.1 10*3/uL (ref 3.4–10.8)

## 2015-04-20 LAB — HSV 2 ANTIBODY, IGG: HSV 2 Glycoprotein G Ab, IgG: 1.69 index — ABNORMAL HIGH (ref 0.00–0.90)

## 2015-04-20 LAB — HIV ANTIBODY (ROUTINE TESTING W REFLEX): HIV Screen 4th Generation wRfx: NONREACTIVE

## 2015-04-20 LAB — COMMENT2 - HEP PANEL

## 2015-04-20 LAB — HEPATITIS B SURFACE ANTIBODY,QUALITATIVE: Hep B Surface Ab, Qual: REACTIVE

## 2015-04-20 LAB — HEPATITIS A ANTIBODY, TOTAL: Hep A Total Ab: NEGATIVE

## 2015-04-20 LAB — HEPATITIS C ANTIBODY (REFLEX): HCV Ab: >11 s/co ratio — ABNORMAL HIGH (ref 0.0–0.9)

## 2015-04-20 LAB — RPR: RPR Ser Ql: NONREACTIVE

## 2015-04-22 ENCOUNTER — Encounter: Payer: Self-pay | Admitting: Gastroenterology

## 2015-05-16 ENCOUNTER — Other Ambulatory Visit: Payer: Self-pay | Admitting: Family Medicine

## 2015-05-27 ENCOUNTER — Ambulatory Visit (INDEPENDENT_AMBULATORY_CARE_PROVIDER_SITE_OTHER): Payer: Medicaid Other | Admitting: Gastroenterology

## 2015-05-27 ENCOUNTER — Encounter: Payer: Self-pay | Admitting: Gastroenterology

## 2015-05-27 ENCOUNTER — Other Ambulatory Visit: Payer: Self-pay

## 2015-05-27 VITALS — BP 160/87 | HR 91 | Temp 98.2°F | Ht 60.0 in | Wt 145.0 lb

## 2015-05-27 DIAGNOSIS — R894 Abnormal immunological findings in specimens from other organs, systems and tissues: Secondary | ICD-10-CM | POA: Diagnosis not present

## 2015-05-27 DIAGNOSIS — K921 Melena: Secondary | ICD-10-CM | POA: Diagnosis not present

## 2015-05-27 DIAGNOSIS — R768 Other specified abnormal immunological findings in serum: Secondary | ICD-10-CM
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
## 2015-05-28 ENCOUNTER — Encounter: Payer: Self-pay | Admitting: *Deleted

## 2015-05-28 NOTE — Discharge Instructions (Signed)

## 2015-05-29 ENCOUNTER — Telehealth: Payer: Self-pay

## 2015-05-29 ENCOUNTER — Telehealth: Payer: Self-pay | Admitting: Family Medicine

## 2015-05-29 LAB — HCV RNA QUANT RFLX ULTRA OR GENOTYP: HCV Quant Baseline: NOT DETECTED IU/mL

## 2015-05-29 NOTE — Telephone Encounter (Signed)
Called to inform her of results. Pt stated her PCP had already notified her of results.

## 2015-05-29 NOTE — Telephone Encounter (Signed)
-----   Message from [name], MD sent at 05/29/2015  8:57 AM EDT ----- Let the patient know that her test for HCV was negative and she does not have any sign of the virus in her blood. It may be a false positive or she has spontaneously resolved.

## 2015-05-29 NOTE — Telephone Encounter (Signed)
Opened in error

## 2015-05-30 ENCOUNTER — Ambulatory Visit
Admission: RE | Admit: 2015-05-30 | Discharge: 2015-05-30 | Disposition: A | Discharge status: Home / Self Care | Payer: Medicaid Other | Source: Ambulatory Visit | Attending: Gastroenterology | Admitting: Gastroenterology

## 2015-05-30 ENCOUNTER — Encounter
Admission: RE | Disposition: A | Discharge status: Home / Self Care | Payer: Self-pay | Source: Ambulatory Visit | Attending: Gastroenterology

## 2015-05-30 ENCOUNTER — Ambulatory Visit: Payer: Medicaid Other | Admitting: Anesthesiology

## 2015-05-30 ENCOUNTER — Other Ambulatory Visit: Payer: Self-pay | Admitting: Gastroenterology

## 2015-05-30 DIAGNOSIS — M549 Dorsalgia, unspecified: Secondary | ICD-10-CM | POA: Diagnosis not present

## 2015-05-30 DIAGNOSIS — K921 Melena: Secondary | ICD-10-CM | POA: Insufficient documentation

## 2015-05-30 DIAGNOSIS — Z801 Family history of malignant neoplasm of trachea, bronchus and lung: Secondary | ICD-10-CM | POA: Insufficient documentation

## 2015-05-30 DIAGNOSIS — Z880 Allergy status to penicillin: Secondary | ICD-10-CM | POA: Diagnosis not present

## 2015-05-30 DIAGNOSIS — Z881 Allergy status to other antibiotic agents status: Secondary | ICD-10-CM | POA: Insufficient documentation

## 2015-05-30 DIAGNOSIS — Z91030 Bee allergy status: Secondary | ICD-10-CM | POA: Diagnosis not present

## 2015-05-30 DIAGNOSIS — Z885 Allergy status to narcotic agent status: Secondary | ICD-10-CM | POA: Insufficient documentation

## 2015-05-30 DIAGNOSIS — Z888 Allergy status to other drugs, medicaments and biological substances status: Secondary | ICD-10-CM | POA: Diagnosis not present

## 2015-05-30 DIAGNOSIS — F17210 Nicotine dependence, cigarettes, uncomplicated: Secondary | ICD-10-CM | POA: Diagnosis not present

## 2015-05-30 DIAGNOSIS — M1990 Unspecified osteoarthritis, unspecified site: Secondary | ICD-10-CM | POA: Insufficient documentation

## 2015-05-30 DIAGNOSIS — Z882 Allergy status to sulfonamides status: Secondary | ICD-10-CM | POA: Diagnosis not present

## 2015-05-30 DIAGNOSIS — Z8249 Family history of ischemic heart disease and other diseases of the circulatory system: Secondary | ICD-10-CM | POA: Diagnosis not present

## 2015-05-30 DIAGNOSIS — J449 Chronic obstructive pulmonary disease, unspecified: Secondary | ICD-10-CM | POA: Insufficient documentation

## 2015-05-30 DIAGNOSIS — Z823 Family history of stroke: Secondary | ICD-10-CM | POA: Diagnosis not present

## 2015-05-30 DIAGNOSIS — G8929 Other chronic pain: Secondary | ICD-10-CM | POA: Insufficient documentation

## 2015-05-30 DIAGNOSIS — Z825 Family history of asthma and other chronic lower respiratory diseases: Secondary | ICD-10-CM | POA: Insufficient documentation

## 2015-05-30 DIAGNOSIS — M797 Fibromyalgia: Secondary | ICD-10-CM | POA: Insufficient documentation

## 2015-05-30 DIAGNOSIS — B1920 Unspecified viral hepatitis C without hepatic coma: Secondary | ICD-10-CM | POA: Insufficient documentation

## 2015-05-30 DIAGNOSIS — Z833 Family history of diabetes mellitus: Secondary | ICD-10-CM | POA: Insufficient documentation

## 2015-05-30 DIAGNOSIS — K623 Rectal prolapse: Secondary | ICD-10-CM | POA: Insufficient documentation

## 2015-05-30 DIAGNOSIS — Z79891 Long term (current) use of opiate analgesic: Secondary | ICD-10-CM | POA: Insufficient documentation

## 2015-05-30 DIAGNOSIS — Z87442 Personal history of urinary calculi: Secondary | ICD-10-CM | POA: Insufficient documentation

## 2015-05-30 DIAGNOSIS — Z79899 Other long term (current) drug therapy: Secondary | ICD-10-CM | POA: Diagnosis not present

## 2015-05-30 DIAGNOSIS — D124 Benign neoplasm of descending colon: Secondary | ICD-10-CM | POA: Insufficient documentation

## 2015-05-30 DIAGNOSIS — Z7951 Long term (current) use of inhaled steroids: Secondary | ICD-10-CM | POA: Insufficient documentation

## 2015-05-30 DIAGNOSIS — F419 Anxiety disorder, unspecified: Secondary | ICD-10-CM | POA: Diagnosis not present

## 2015-05-30 DIAGNOSIS — N189 Chronic kidney disease, unspecified: Secondary | ICD-10-CM | POA: Insufficient documentation

## 2015-05-30 HISTORY — PX: COLONOSCOPY WITH PROPOFOL: SHX5780

## 2015-05-30 HISTORY — DX: Fibromyalgia: M79.7

## 2015-05-30 HISTORY — DX: Presence of dental prosthetic device (complete) (partial): Z97.2

## 2015-05-30 HISTORY — PX: POLYPECTOMY: SHX5525

## 2015-05-30 SURGERY — COLONOSCOPY WITH PROPOFOL
Anesthesia: Monitor Anesthesia Care | Wound class: Contaminated

## 2015-05-30 MED ORDER — OXYCODONE HCL 5 MG PO TABS
10.0000 mg | ORAL_TABLET | Freq: Once | ORAL | Status: AC
  Administered 2015-05-30: 10 mg via ORAL

## 2015-05-30 MED ORDER — LIDOCAINE HCL (CARDIAC) 20 MG/ML IV SOLN
INTRAVENOUS | Status: DC | PRN
  Administered 2015-05-30: 50 mg via INTRAVENOUS

## 2015-05-30 MED ORDER — LACTATED RINGERS IV SOLN
INTRAVENOUS | Status: DC
  Administered 2015-05-30: 10:00:00 via INTRAVENOUS

## 2015-05-30 MED ORDER — STERILE WATER FOR IRRIGATION IR SOLN
Status: DC | PRN
  Administered 2015-05-30: 5 mL

## 2015-05-30 MED ORDER — OXYCODONE HCL 5 MG PO TABS: 5.0000 mg | ORAL_TABLET | Freq: Once | ORAL | Status: DC | PRN

## 2015-05-30 MED ORDER — PROPOFOL 10 MG/ML IV BOLUS
INTRAVENOUS | Status: DC | PRN
  Administered 2015-05-30 (×3): 30 mg via INTRAVENOUS
  Administered 2015-05-30: 20 mg via INTRAVENOUS
  Administered 2015-05-30: 30 mg via INTRAVENOUS
  Administered 2015-05-30 (×2): 20 mg via INTRAVENOUS
  Administered 2015-05-30: 50 mg via INTRAVENOUS

## 2015-05-30 MED ORDER — OXYCODONE HCL 5 MG/5ML PO SOLN: 5.0000 mg | Freq: Once | ORAL | Status: DC | PRN

## 2015-05-30 SURGICAL SUPPLY — 28 items
CANISTER SUCT 1200ML W/VALVE (MISCELLANEOUS) ×4 IMPLANT
FCP ESCP3.2XJMB 240X2.8X (MISCELLANEOUS)
FORCEPS BIOP RAD 4 LRG CAP 4 (CUTTING FORCEPS) IMPLANT
FORCEPS BIOP RJ4 240 W/NDL (MISCELLANEOUS)
FORCEPS ESCP3.2XJMB 240X2.8X (MISCELLANEOUS) IMPLANT
GOWN CVR UNV OPN BCK APRN NK (MISCELLANEOUS) ×4 IMPLANT
GOWN ISOL THUMB LOOP REG UNIV (MISCELLANEOUS) ×8
HEMOCLIP INSTINCT (CLIP) IMPLANT
INJECTOR VARIJECT VIN23 (MISCELLANEOUS) IMPLANT
KIT CO2 TUBING (TUBING) IMPLANT
KIT DEFENDO VALVE AND CONN (KITS) IMPLANT
KIT ENDO PROCEDURE OLY (KITS) ×4 IMPLANT
LIGATOR MULTIBAND 6SHOOTER MBL (MISCELLANEOUS) IMPLANT
MARKER SPOT ENDO TATTOO 5ML (MISCELLANEOUS) IMPLANT
PAD GROUND ADULT SPLIT (MISCELLANEOUS) IMPLANT
SNARE SHORT THROW 13M SML OVAL (MISCELLANEOUS) IMPLANT
SNARE SHORT THROW 30M LRG OVAL (MISCELLANEOUS) IMPLANT
SPOT EX ENDOSCOPIC TATTOO (MISCELLANEOUS)
SUCTION POLY TRAP 4CHAMBER (MISCELLANEOUS) IMPLANT
TRAP SUCTION POLY (MISCELLANEOUS) IMPLANT
TUBING CONN 6MMX3.1M (TUBING)
TUBING SUCTION CONN 0.25 STRL (TUBING) IMPLANT
UNDERPAD 30X60 958B10 (PK) (MISCELLANEOUS) IMPLANT
VALVE BIOPSY ENDO (VALVE) IMPLANT
VARIJECT INJECTOR VIN23 (MISCELLANEOUS)
WATER AUXILLARY (MISCELLANEOUS) IMPLANT
WATER STERILE IRR 250ML POUR (IV SOLUTION) ×4 IMPLANT
WATER STERILE IRR 500ML POUR (IV SOLUTION) IMPLANT

## 2015-05-30 NOTE — Op Note (Signed)
[facility] Patient Name: Jamie Schultz Procedure Date: 05/30/2015 10:36 AM MRN: [id number] Account #: [id number] Date of Birth: 14-Jul-1961 Admit Type: Outpatient Age: 54 Room: [facility] OR ROOM 01 Gender: Female Note Status: Finalized Procedure:         Colonoscopy Indications:       Hematochezia Providers:         [name], MD Referring MD:      Bobetta Lime (Referring MD) Medicines:         Propofol per Anesthesia Complications:     No immediate complications. Procedure:         Pre-Anesthesia Assessment:                    - Prior to the procedure, a History and Physical was                     performed, and patient medications and allergies were                     reviewed. The patient's tolerance of previous anesthesia                     was also reviewed. The risks and benefits of the procedure                     and the sedation options and risks were discussed with the                     patient. All questions were answered, and informed consent                     was obtained. Prior Anticoagulants: The patient has taken                     no previous anticoagulant or antiplatelet agents. ASA                     Grade Assessment: II - A patient with mild systemic                     disease. After reviewing the risks and benefits, the                     patient was deemed in satisfactory condition to undergo                     the procedure.                    After obtaining informed consent, the colonoscope was                     passed under direct vision. Throughout the procedure, the                     patient's blood pressure, pulse, and oxygen saturations                     were monitored continuously. The Olympus CF H180AL                     colonoscope (S#: [id number]) was introduced through the anus                     and  advanced to the the cecum, identified by appendiceal                     orifice and ileocecal  valve. The colonoscopy was performed                     without difficulty. The patient tolerated the procedure                     well. The quality of the bowel preparation was poor. Findings:      The perianal and digital rectal examinations were normal.      Two sessile polyps were found in the descending colon. The polyps were 5       to 7 mm in size. These polyps were removed with a cold snare. Resection       and retrieval were complete.      Mild rectal prolapse was present.      Localized mild inflammation characterized by erythema was found in the       rectum. Impression:        - Preparation of the colon was poor.                    - Two 5 to 7 mm polyps in the descending colon. Resected                     and retrieved.                    - Rectal prolapse.                    - Localized mild inflammation was found in the rectum                     secondary to proctitis. Recommendation:    - Await pathology results.                    - Repeat colonoscopy in 5 years if polyp adenoma and 10                     years if hyperplastic Procedure Code(s): --- Professional ---                    [phone], Colonoscopy, flexible; with removal of tumor(s),                     polyp(s), or other lesion(s) by snare technique Diagnosis Code(s): --- Professional ---                    K92.1, Melena                    D12.4, Benign neoplasm of descending colon                    K62.3, Rectal prolapse                    K62.89, Other specified diseases of anus and rectum CPT copyright 2014 American Medical Association. All rights reserved. The codes documented in this report are preliminary and upon coder review may  be revised to meet current compliance requirements. [name], MD 05/30/2015 11:13:28 AM This report has been signed electronically. Number of Addenda: 0 Note Initiated On: 05/30/2015 10:36 AM Scope  Withdrawal Time: 0 hours 7 minutes 10 seconds  Total Procedure  Duration: 0 hours 14 minutes 42 seconds       [facility]

## 2015-05-30 NOTE — Anesthesia Postprocedure Evaluation (Signed)
  Anesthesia Post-op Note  Patient: Jamie Schultz  Procedure(s) Performed: Procedure(s): COLONOSCOPY WITH PROPOFOL (N/A) POLYPECTOMY  Anesthesia type:MAC  Patient location: PACU  Post pain: Pain level controlled  Post assessment: Post-op Vital signs reviewed, Patient's Cardiovascular Status Stable, Respiratory Function Stable, Patent Airway and No signs of Nausea or vomiting  Post vital signs: Reviewed and stable  Last Vitals:  Filed Vitals:   05/30/15 1136  BP: 112/79  Pulse: 78  Temp:   Resp: 14    Level of consciousness: awake, alert  and patient cooperative  Complications: No apparent anesthesia complications

## 2015-05-30 NOTE — Anesthesia Procedure Notes (Signed)
Procedure Name: MAC Performed by: AMYOT, MICHAEL Pre-anesthesia Checklist: Patient identified, Emergency Drugs available, Suction available, Timeout performed and Patient being monitored Patient Re-evaluated:Patient Re-evaluated prior to inductionOxygen Delivery Method: Nasal cannula Placement Confirmation: positive ETCO2       
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
## 2015-05-30 NOTE — Anesthesia Preprocedure Evaluation (Signed)
Anesthesia Evaluation   Patient awake    Reviewed: Allergy & Precautions, H&P , Patient's Chart, lab work & pertinent test results  History of Anesthesia Complications Negative for: history of anesthetic complications  Airway Mallampati: II  TM Distance: >3 FB Neck ROM: full    Dental  (+) Edentulous Upper, Edentulous Lower   Pulmonary COPD, Current Smoker,    Pulmonary exam normal        Cardiovascular negative cardio ROS Normal cardiovascular exam     Neuro/Psych PSYCHIATRIC DISORDERS    GI/Hepatic GERD  Controlled,(+) Hepatitis -, C  Endo/Other  Hypothyroidism   Renal/GU      Musculoskeletal   Abdominal   Peds  Hematology   Anesthesia Other Findings   Reproductive/Obstetrics                             Anesthesia Physical Anesthesia Plan  ASA: III  Anesthesia Plan: MAC   Post-op Pain Management:    Induction:   Airway Management Planned:   Additional Equipment:   Intra-op Plan:   Post-operative Plan:   Informed Consent: I have reviewed the patients History and Physical, chart, labs and discussed the procedure including the risks, benefits and alternatives for the proposed anesthesia with the patient or authorized representative who has indicated his/her understanding and acceptance.     Plan Discussed with: CRNA  Anesthesia Plan Comments:         Anesthesia Quick Evaluation

## 2015-05-30 NOTE — Transfer of Care (Signed)
Immediate Anesthesia Transfer of Care Note  Patient: Jamie Schultz  Procedure(s) Performed: Procedure(s): COLONOSCOPY WITH PROPOFOL (N/A) POLYPECTOMY  Patient Location: PACU  Anesthesia Type: MAC  Level of Consciousness: awake, alert  and patient cooperative  Airway and Oxygen Therapy: Patient Spontanous Breathing and Patient connected to supplemental oxygen  Post-op Assessment: Post-op Vital signs reviewed, Patient's Cardiovascular Status Stable, Respiratory Function Stable, Patent Airway and No signs of Nausea or vomiting  Post-op Vital Signs: Reviewed and stable  Complications: No apparent anesthesia complications

## 2015-05-30 NOTE — Progress Notes (Signed)
Audible inspiratory wheezing noted; pt has combivent inhaler with her, Dr. Jaci Standard informed, MD verbalizes pt may use personal inhaler here in preop.

## 2015-06-03 ENCOUNTER — Other Ambulatory Visit: Payer: Self-pay | Admitting: Family Medicine

## 2015-06-03 MED ORDER — LYRICA 100 MG PO CAPS: 100.0000 mg | ORAL_CAPSULE | Freq: Three times a day (TID) | ORAL | Status: DC

## 2015-06-03 NOTE — Telephone Encounter (Signed)
Refill request was sent to Dr. Ashany Sundaram for approval and submission.  

## 2015-06-03 NOTE — Telephone Encounter (Signed)
Patient is needing a new prescription for Lyrica. Please send to [facility]

## 2015-06-06 IMAGING — CR DG SHOULDER 3+V*L*
1 series · 3 of 3 positions shown · non-contrast
Comparison: Left shoulder radiographs 04/30/2014 and lumbar spine
radiographs 04/23/2014

CLINICAL DATA: Fell getting out of the bathtub today. Left shoulder
pain and back pain.

EXAM:
DG SHOULDER 3+VIEWS LEFT; LUMBAR SPINE - 2-3 VIEW

[Series 1: dxr shoulder left complete · 0.14mm/px · 3 of 3 slices shown]
[im 1/3]
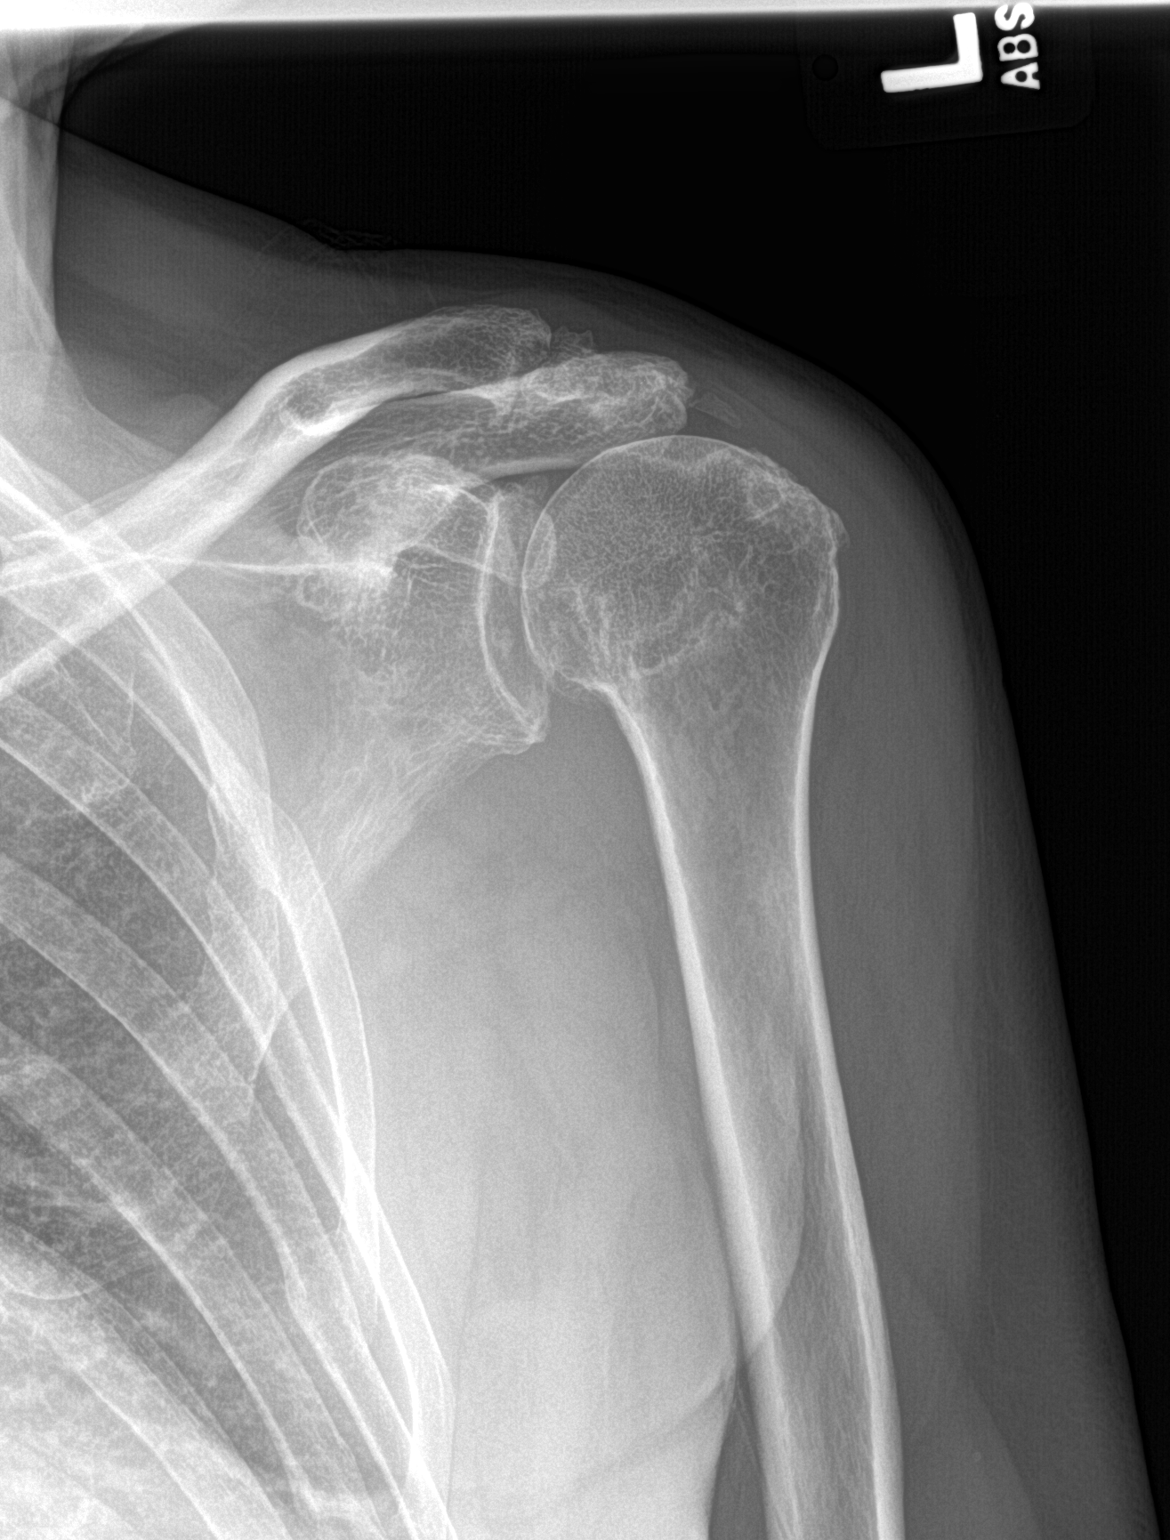
[im 2/3]
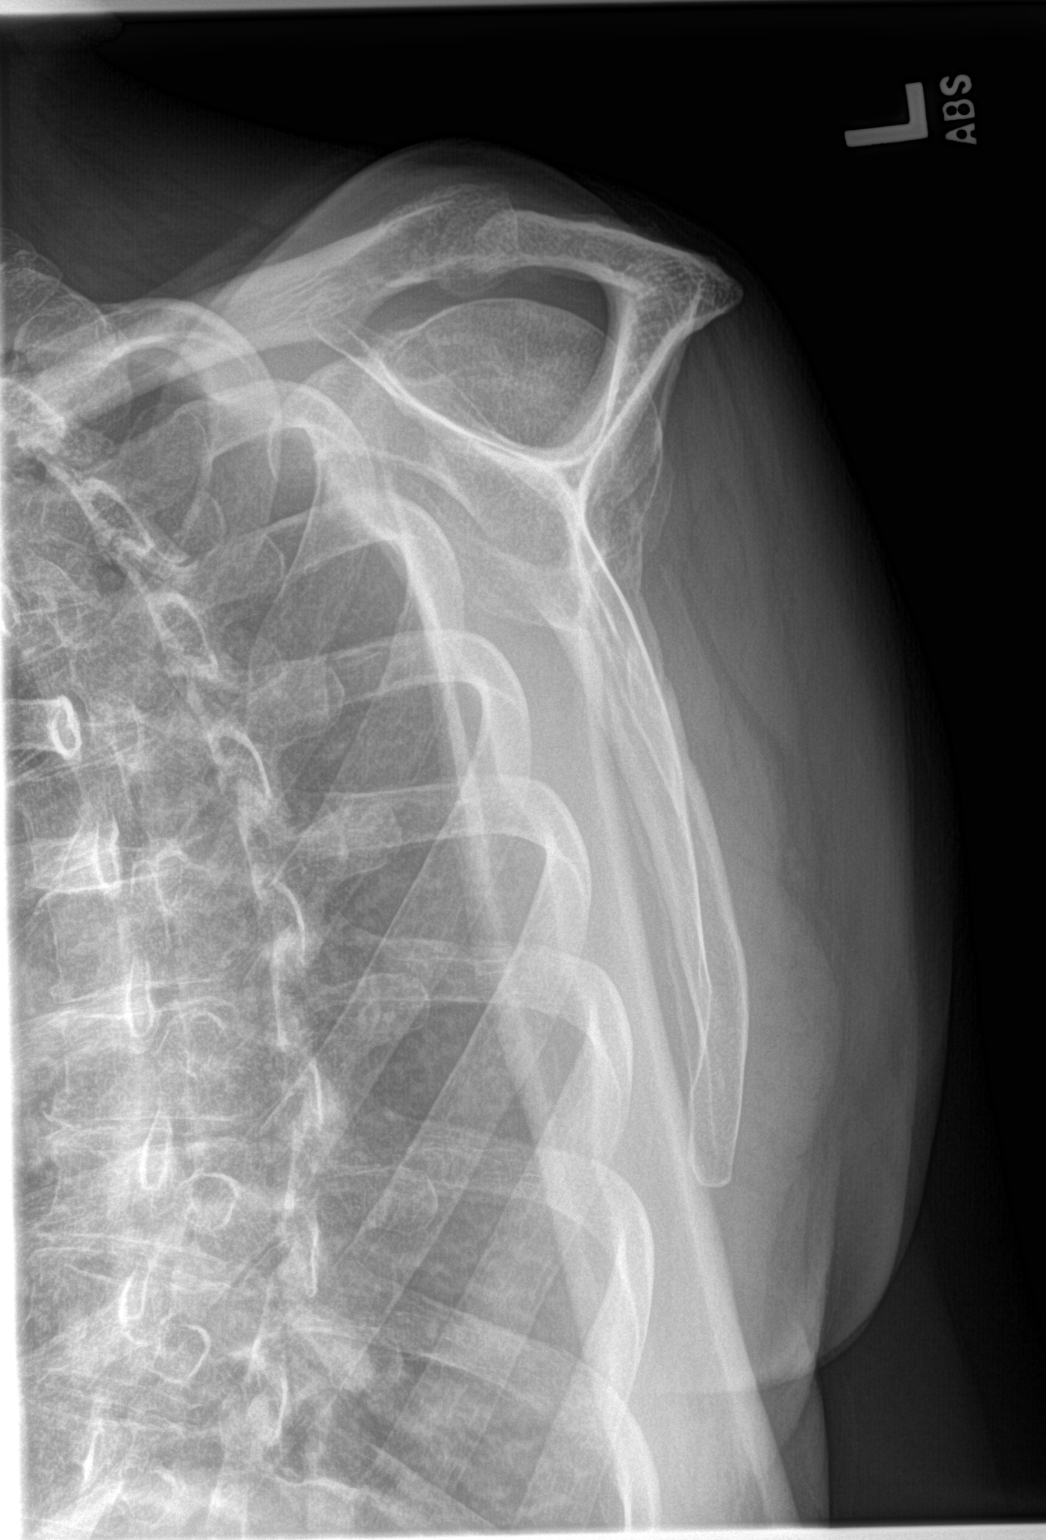
[im 3/3]
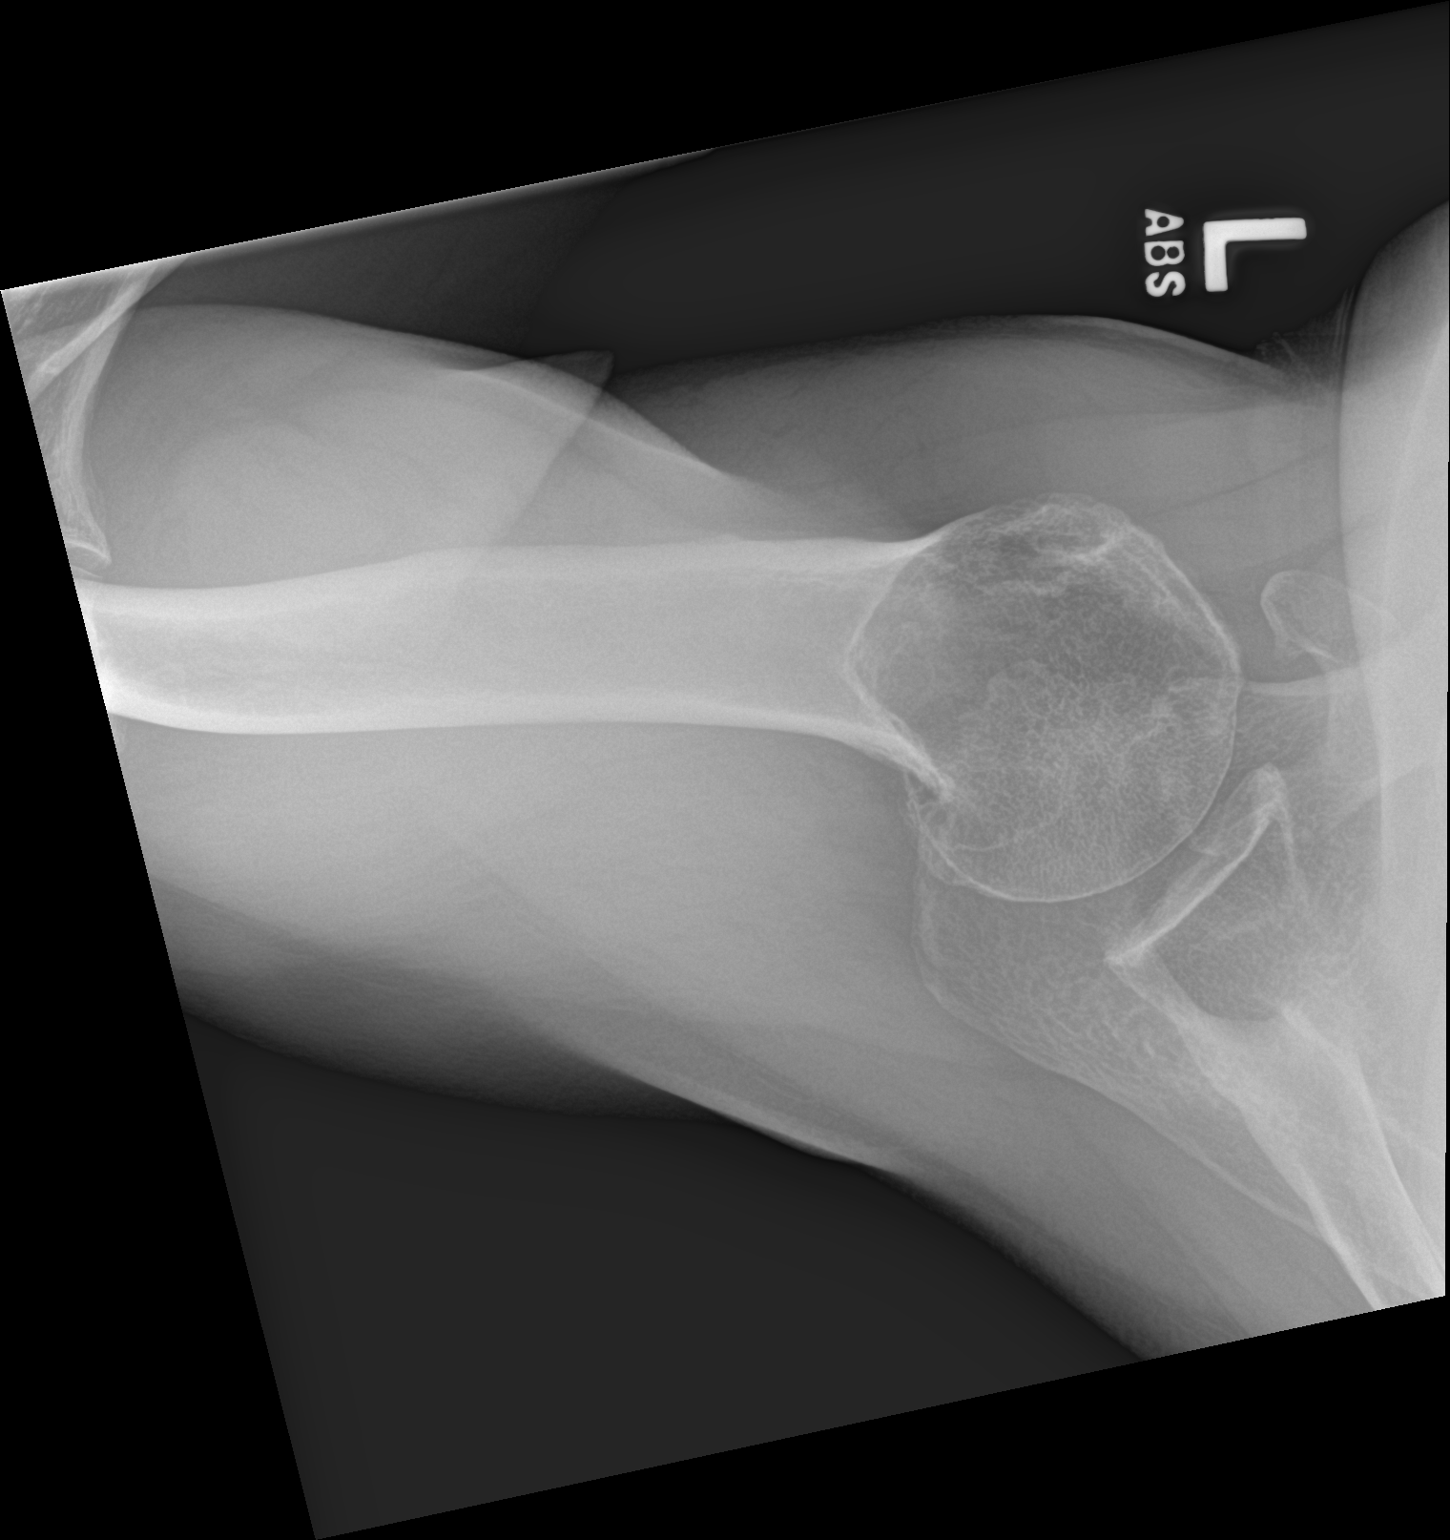

[3 of 3 positions shown; findings below may reference images not displayed]

FINDINGS: Left shoulder:

Stable moderate degenerative changes involving the AC joint and
glenohumeral joint. No acute fracture is identified. The visualized
left ribs are intact. Remote rib fractures are noted. The visualized
left lung is clear.

Lumbar spine:

Stable degenerative anterolisthesis of L4 due to advanced facet
disease. There is associated advanced degenerative disc disease at
L4-5. No acute bony findings. Advanced aortic calcifications for age
without definite aneurysm. The visualized bony pelvis is intact.
IMPRESSION: 1. Moderate stable degenerative changes involving the left shoulder.
No acute fracture or dislocation.
2. Stable degenerative anterolisthesis of L4 with associated
advanced disc disease at L4-5. No acute bony findings.

## 2015-06-06 IMAGING — CR DG LUMBAR SPINE 2-3V
1 series · 3 of 3 positions shown · non-contrast
Comparison: Left shoulder radiographs 04/30/2014 and lumbar spine
radiographs 04/23/2014

CLINICAL DATA: Fell getting out of the bathtub today. Left shoulder
pain and back pain.

EXAM:
DG SHOULDER 3+VIEWS LEFT; LUMBAR SPINE - 2-3 VIEW

[Series 1: dxr lumbar spine ap and lateral · 0.14mm/px · 3 of 3 slices shown]
[im 1/3]
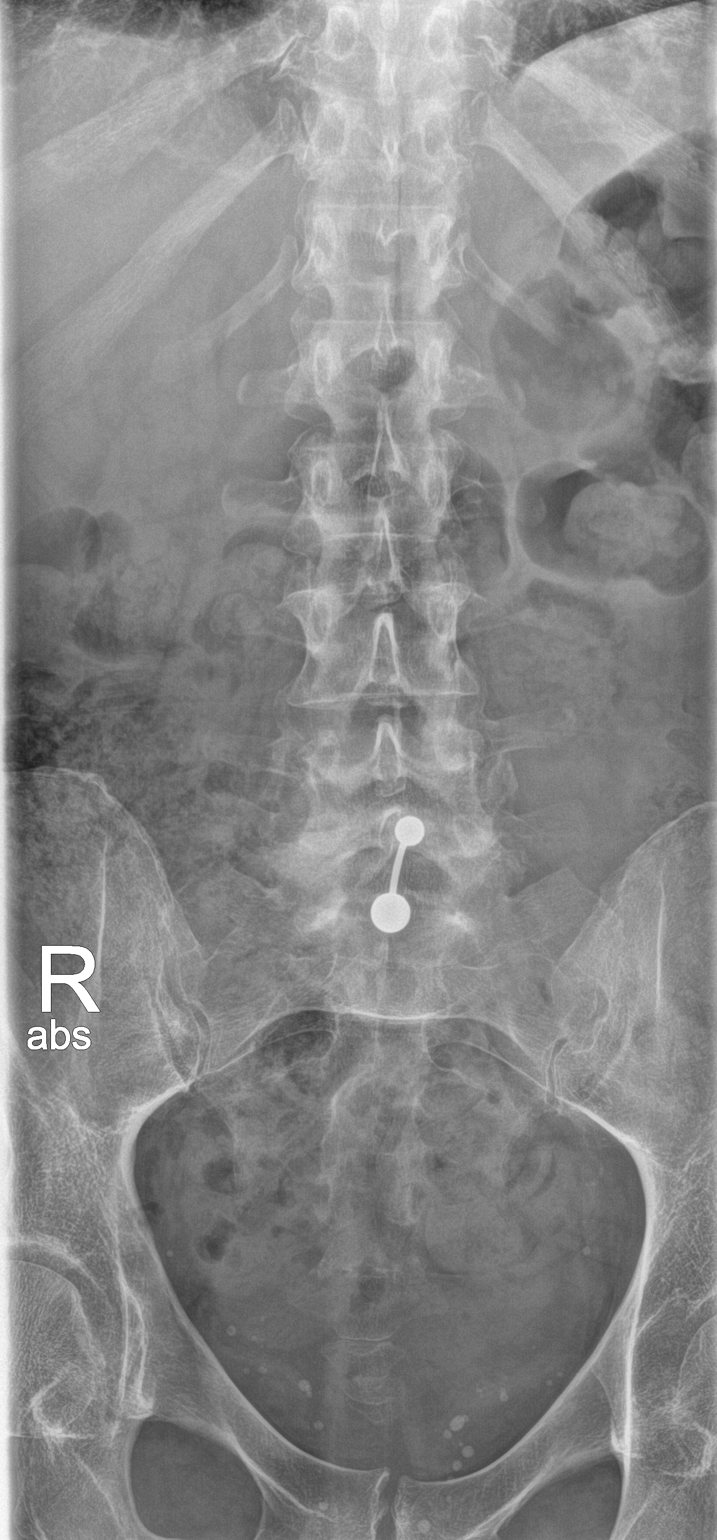
[im 2/3]
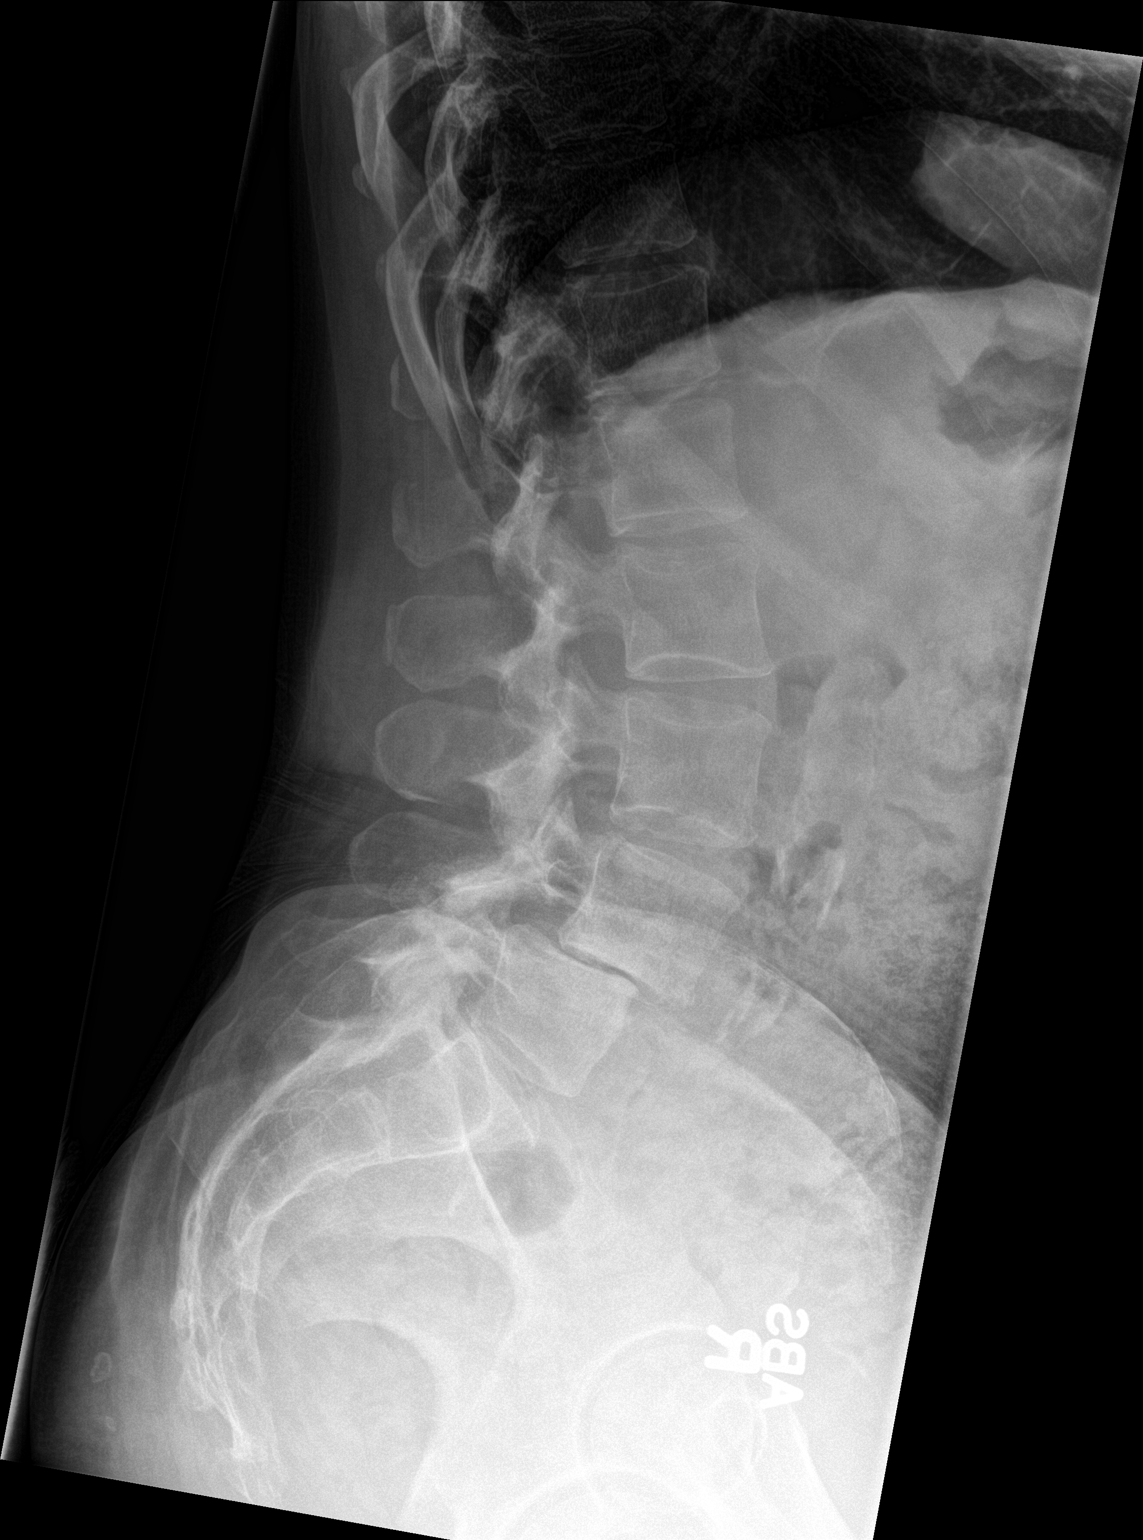
[im 3/3]
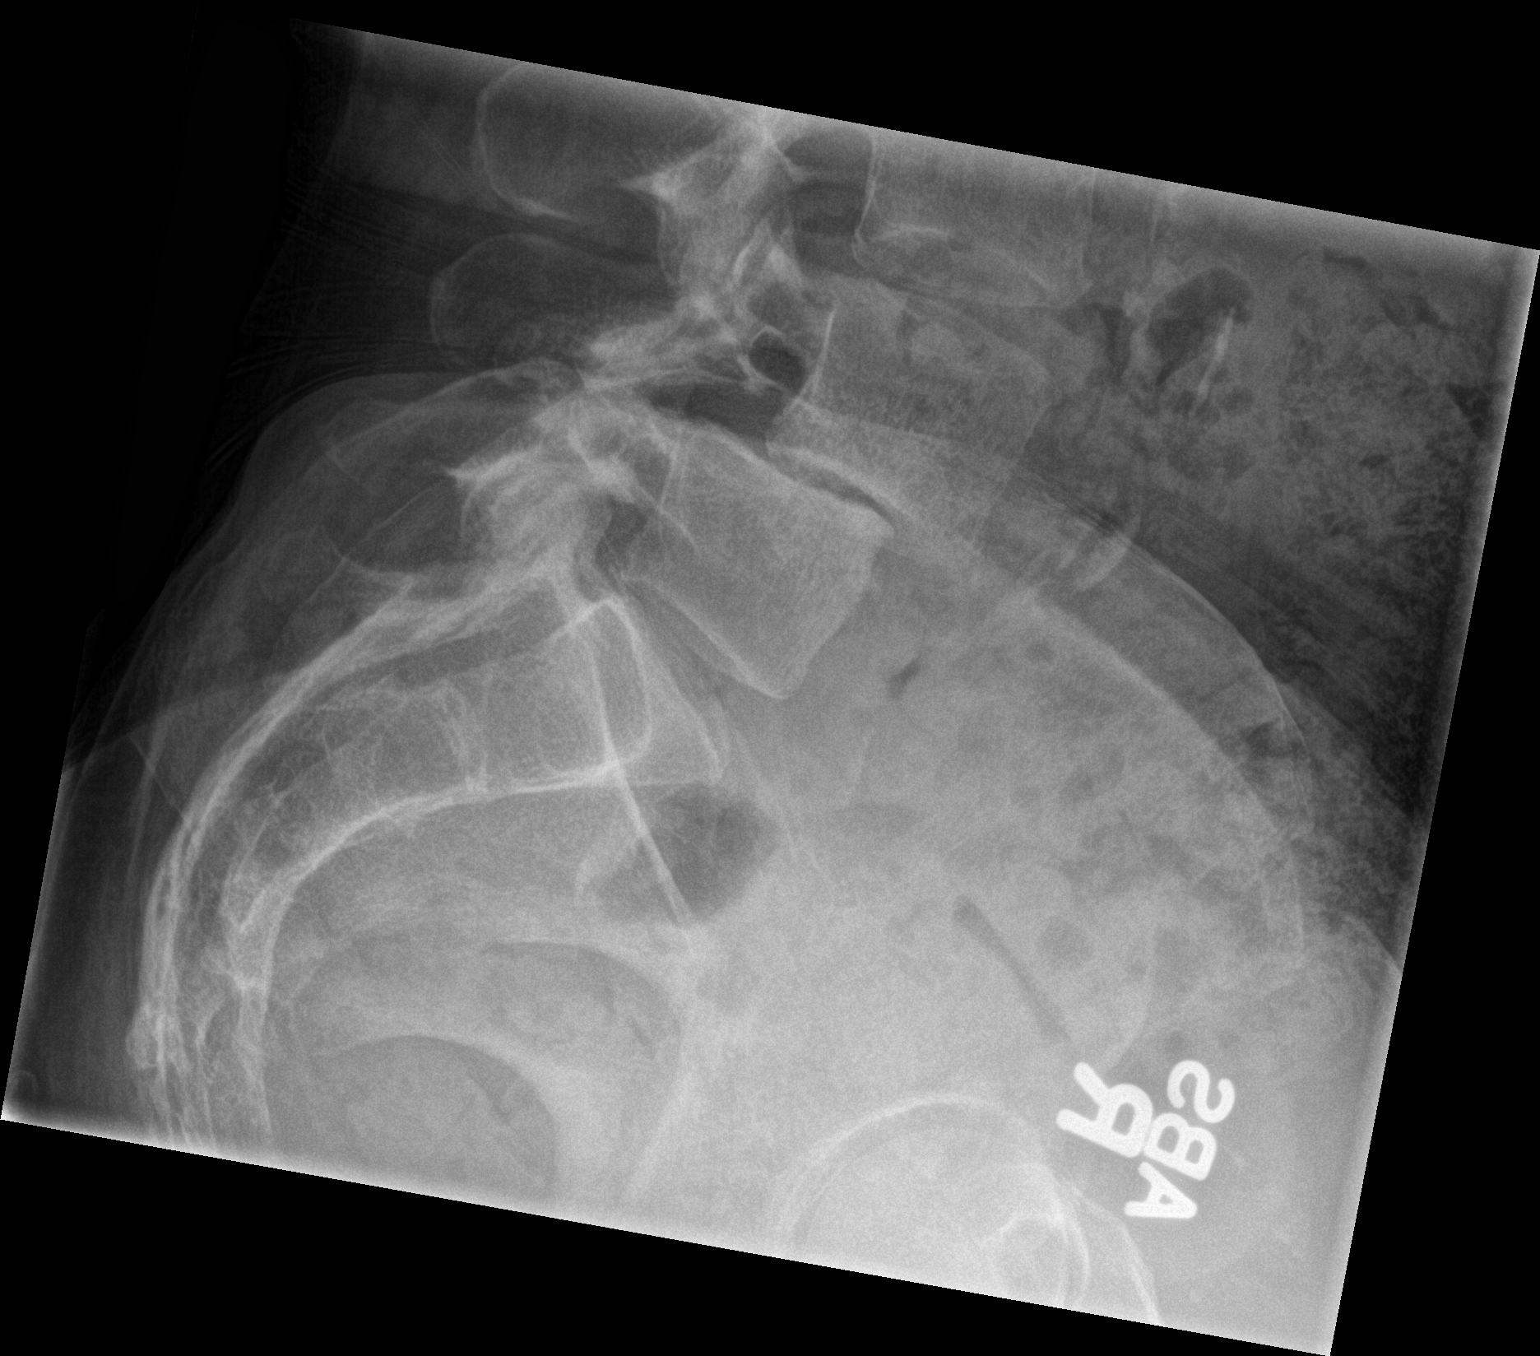

[3 of 3 positions shown; findings below may reference images not displayed]

FINDINGS: Left shoulder:

Stable moderate degenerative changes involving the AC joint and
glenohumeral joint. No acute fracture is identified. The visualized
left ribs are intact. Remote rib fractures are noted. The visualized
left lung is clear.

Lumbar spine:

Stable degenerative anterolisthesis of L4 due to advanced facet
disease. There is associated advanced degenerative disc disease at
L4-5. No acute bony findings. Advanced aortic calcifications for age
without definite aneurysm. The visualized bony pelvis is intact.
IMPRESSION: 1. Moderate stable degenerative changes involving the left shoulder.
No acute fracture or dislocation.
2. Stable degenerative anterolisthesis of L4 with associated
advanced disc disease at L4-5. No acute bony findings.

## 2015-06-10 ENCOUNTER — Telehealth: Payer: Self-pay

## 2015-06-10 NOTE — Telephone Encounter (Signed)
Patient called stating that she spoke with Noah Delaine, a RN with [facility], on yesterday regarding leg pain. She stated that she was advised to go to the ER but she stated that she declined because she wanted to speak to her doctor. She stated that she has been having lots of muscle cramps and so she took Soma 2am and 5am, which helps but she is now out of medication.   She has an appt with the pain clinic in [place] ([facility]) on tomorrow but stated that the Physician Assistant will not give her any Soma b/c Dr. Nadine Counts was already doing it. She is requesting 100 pills so that her insurance will pay for it.  Patient has 1 refill left but is requesting an early refill due to the muscle cramps resulting in loss of electrolytes (stated by patient).   carisoprodol (SOMA) 350 MG tablet [[id number]]    Dose: 350 mg Route: Oral Frequency: 3 times daily PRN  Dispense Quantity:  90 tablet Refills:  3 Fills Remaining:  3        Sig: Take 1 tablet (350 mg total) by mouth 3 (three) times daily as needed. For pain       Written Date:  03/28/15 Expiration Date:  09/24/15    Start Date:  03/28/15 End Date:  --    Ordering Provider:  -- Authorizing Provider:  Bobetta Lime, MD Ordering User:  Bobetta Lime, MD        Diagnosis Association: Chronic back pain (724.5 , 338.29); Left rotator cuff tear arthropathy (716.91)     Refill request was sent to Dr. Steele Sizer for approval and submission.

## 2015-06-11 ENCOUNTER — Other Ambulatory Visit: Payer: Self-pay | Admitting: Family Medicine

## 2015-06-11 ENCOUNTER — Other Ambulatory Visit: Payer: Self-pay

## 2015-06-11 DIAGNOSIS — M549 Dorsalgia, unspecified: Principal | ICD-10-CM

## 2015-06-11 DIAGNOSIS — M12812 Other specific arthropathies, not elsewhere classified, left shoulder: Secondary | ICD-10-CM

## 2015-06-11 DIAGNOSIS — G8929 Other chronic pain: Secondary | ICD-10-CM

## 2015-06-11 DIAGNOSIS — M75102 Unspecified rotator cuff tear or rupture of left shoulder, not specified as traumatic: Secondary | ICD-10-CM

## 2015-06-11 MED ORDER — CARISOPRODOL 350 MG PO TABS: 350.0000 mg | ORAL_TABLET | Freq: Three times a day (TID) | ORAL | Status: DC | PRN

## 2015-06-11 NOTE — Telephone Encounter (Signed)
Pt informed

## 2015-06-11 NOTE — Telephone Encounter (Signed)
Let patient know Rx Soma printed out and ready to pick up.

## 2015-06-13 ENCOUNTER — Ambulatory Visit: Payer: Medicaid Other | Admitting: Family Medicine

## 2015-06-14 ENCOUNTER — Encounter: Payer: Self-pay | Admitting: Family Medicine

## 2015-06-14 ENCOUNTER — Ambulatory Visit (INDEPENDENT_AMBULATORY_CARE_PROVIDER_SITE_OTHER): Payer: Medicaid Other | Admitting: Family Medicine

## 2015-06-14 VITALS — BP 142/76 | HR 94 | Temp 98.7°F | Resp 18 | Wt 154.9 lb

## 2015-06-14 DIAGNOSIS — R300 Dysuria: Secondary | ICD-10-CM

## 2015-06-14 DIAGNOSIS — J439 Emphysema, unspecified: Secondary | ICD-10-CM | POA: Diagnosis not present

## 2015-06-14 DIAGNOSIS — R768 Other specified abnormal immunological findings in serum: Secondary | ICD-10-CM | POA: Diagnosis not present

## 2015-06-14 DIAGNOSIS — E038 Other specified hypothyroidism: Secondary | ICD-10-CM | POA: Diagnosis not present

## 2015-06-14 DIAGNOSIS — R7689 Other specified abnormal immunological findings in serum: Secondary | ICD-10-CM

## 2015-06-14 DIAGNOSIS — E034 Atrophy of thyroid (acquired): Secondary | ICD-10-CM | POA: Diagnosis not present

## 2015-06-14 DIAGNOSIS — N952 Postmenopausal atrophic vaginitis: Secondary | ICD-10-CM | POA: Diagnosis not present

## 2015-06-14 DIAGNOSIS — M791 Myalgia, unspecified site: Secondary | ICD-10-CM

## 2015-06-14 DIAGNOSIS — R110 Nausea: Secondary | ICD-10-CM | POA: Diagnosis not present

## 2015-06-14 DIAGNOSIS — K5903 Drug induced constipation: Secondary | ICD-10-CM | POA: Diagnosis not present

## 2015-06-14 DIAGNOSIS — T402X5A Adverse effect of other opioids, initial encounter: Secondary | ICD-10-CM

## 2015-06-14 DIAGNOSIS — M6250 Muscle wasting and atrophy, not elsewhere classified, unspecified site: Secondary | ICD-10-CM | POA: Insufficient documentation

## 2015-06-14 LAB — POCT URINALYSIS DIPSTICK
BILIRUBIN UA: NEGATIVE
GLUCOSE UA: NEGATIVE
Ketones, UA: NEGATIVE
NITRITE UA: NEGATIVE
Protein, UA: NEGATIVE
RBC UA: NEGATIVE
Spec Grav, UA: 1.01
Urobilinogen, UA: 0.2
pH, UA: 6

## 2015-06-14 MED ORDER — PREMARIN 0.625 MG/GM VA CREA: TOPICAL_CREAM | VAGINAL | Status: DC

## 2015-06-14 MED ORDER — FLUTICASONE FUROATE-VILANTEROL 100-25 MCG/INH IN AEPB: 1.0000 | INHALATION_SPRAY | Freq: Every day | RESPIRATORY_TRACT | Status: DC

## 2015-06-14 MED ORDER — NALOXEGOL OXALATE 12.5 MG PO TABS: 25.0000 mg | ORAL_TABLET | Freq: Every day | ORAL | Status: DC

## 2015-06-14 MED ORDER — PROMETHAZINE HCL 25 MG PO TABS: 25.0000 mg | ORAL_TABLET | Freq: Three times a day (TID) | ORAL | Status: DC | PRN

## 2015-06-14 MED ORDER — HYDROXYZINE HCL 25 MG PO TABS: 25.0000 mg | ORAL_TABLET | Freq: Three times a day (TID) | ORAL | Status: DC | PRN
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
## 2015-06-15 LAB — T4, FREE: Free T4: 0.67 ng/dL — ABNORMAL LOW (ref 0.82–1.77)

## 2015-06-15 LAB — TSH: TSH: 3.97 u[IU]/mL (ref 0.450–4.500)

## 2015-06-15 LAB — APTT: aPTT: 27 s (ref 24–33)

## 2015-06-15 LAB — LACTATE DEHYDROGENASE: LDH: 224 IU/L (ref 119–226)

## 2015-06-15 LAB — PROTHROMBIN TIME + INR
INR: 1 (ref 0.8–1.2)
Prothrombin Time: 10 s (ref 9.1–12.0)

## 2015-06-15 LAB — CK: Total CK: 450 U/L — ABNORMAL HIGH (ref 24–173)

## 2015-06-15 LAB — T3, FREE: T3, Free: 2.2 pg/mL (ref 2.0–4.4)

## 2015-06-16 LAB — URINE CULTURE

## 2015-06-16 LAB — ALDOLASE: Aldolase: 8.6 U/L (ref 3.3–10.3)

## 2015-06-17 ENCOUNTER — Other Ambulatory Visit: Payer: Self-pay | Admitting: Family Medicine

## 2015-06-17 ENCOUNTER — Telehealth: Payer: Self-pay | Admitting: Family Medicine

## 2015-06-17 DIAGNOSIS — T402X5A Adverse effect of other opioids, initial encounter: Principal | ICD-10-CM

## 2015-06-17 DIAGNOSIS — K5903 Drug induced constipation: Secondary | ICD-10-CM

## 2015-06-17 MED ORDER — NALOXEGOL OXALATE 12.5 MG PO TABS: 25.0000 mg | ORAL_TABLET | Freq: Every day | ORAL | Status: DC

## 2015-06-17 NOTE — Telephone Encounter (Signed)
Her insurance will not cover diabetes supplies without an ACTIVE diagnosis of diabetes in her chart with on going management and testing validating need to test blood sugars at home.

## 2015-06-17 NOTE — Telephone Encounter (Signed)
Pt said that her meter to test for diab had given out and she  ( even though she does not have diab) needs a order for a new one along with all the supplies that is needed to able to check for this for she has had to do this since she took a sulfa drug and it almost killed her for she had total renal failure. She did not know if she told you this in her appts.

## 2015-06-17 NOTE — Telephone Encounter (Signed)
Sent again

## 2015-06-17 NOTE — Telephone Encounter (Signed)
Pt was seen on 06/14/15 and stated that the Movantik 12.5mg  was never sent in to her pharmacy ([facility] on [address] in [place]).  Please call once complete

## 2015-06-18 ENCOUNTER — Telehealth: Payer: Self-pay

## 2015-06-18 NOTE — Telephone Encounter (Signed)
Patient was informed of her abnormal lab results. Patient was then asked if she wanted to proceed with the neurologist and she agreed but stated she did not want to go to [facility]. Patient was told that a referral will be placed and then she will be informed.

## 2015-06-19 ENCOUNTER — Other Ambulatory Visit: Payer: Self-pay | Admitting: Family Medicine

## 2015-06-19 DIAGNOSIS — M6250 Muscle wasting and atrophy, not elsewhere classified, unspecified site: Secondary | ICD-10-CM

## 2015-06-19 NOTE — Telephone Encounter (Signed)
Referral placed.

## 2015-06-24 ENCOUNTER — Ambulatory Visit: Payer: Medicaid Other | Admitting: Family Medicine

## 2015-06-24 ENCOUNTER — Telehealth: Payer: Self-pay

## 2015-06-24 NOTE — Telephone Encounter (Signed)
Called patient and notified [facility] that her RX were approved my medicaid.

## 2015-06-25 ENCOUNTER — Encounter: Payer: Self-pay | Admitting: Gastroenterology

## 2015-06-26 ENCOUNTER — Encounter: Payer: Self-pay | Admitting: Family Medicine

## 2015-06-26 ENCOUNTER — Ambulatory Visit (INDEPENDENT_AMBULATORY_CARE_PROVIDER_SITE_OTHER): Payer: Medicaid Other | Admitting: Family Medicine

## 2015-06-26 VITALS — BP 132/88 | HR 90 | Temp 98.1°F | Resp 16 | Wt 150.9 lb

## 2015-06-26 DIAGNOSIS — R6882 Decreased libido: Secondary | ICD-10-CM | POA: Diagnosis not present

## 2015-06-26 DIAGNOSIS — M2550 Pain in unspecified joint: Secondary | ICD-10-CM

## 2015-06-26 DIAGNOSIS — J069 Acute upper respiratory infection, unspecified: Secondary | ICD-10-CM

## 2015-06-26 DIAGNOSIS — M12812 Other specific arthropathies, not elsewhere classified, left shoulder: Secondary | ICD-10-CM

## 2015-06-26 DIAGNOSIS — M6250 Muscle wasting and atrophy, not elsewhere classified, unspecified site: Secondary | ICD-10-CM

## 2015-06-26 DIAGNOSIS — G8929 Other chronic pain: Secondary | ICD-10-CM | POA: Diagnosis not present

## 2015-06-26 DIAGNOSIS — M12512 Traumatic arthropathy, left shoulder: Secondary | ICD-10-CM

## 2015-06-26 DIAGNOSIS — M549 Dorsalgia, unspecified: Secondary | ICD-10-CM | POA: Diagnosis not present

## 2015-06-26 DIAGNOSIS — M75102 Unspecified rotator cuff tear or rupture of left shoulder, not specified as traumatic: Secondary | ICD-10-CM

## 2015-06-26 MED ORDER — CARISOPRODOL 350 MG PO TABS: 350.0000 mg | ORAL_TABLET | Freq: Three times a day (TID) | ORAL | Status: DC | PRN

## 2015-06-26 NOTE — Progress Notes (Signed)
Name: Jamie Schultz   MRN: [id number]    DOB: 1961/02/13   Date:06/26/2015       Progress Note  Subjective  Chief Complaint  Chief Complaint  Patient presents with  . Abnormal Lab    patient wants to talk about her recent lab results  . URI    HPI  Jamie Schultz is here today for follow up of lab work on her . Lab work done 06/14/15. Her urine was negative for infection, her thyroid hormone is normal no meds needed, although some of her muscle enzymes were normal (aldolase, LDH) one enzyme, CK (total CK was 450), was ELEVATED. Given her concern for weaker muscles, restless legs and her CK being elevated I recommend consulting with a Neurologist. She has been referred to [facility] as she complained of proximal muscle weakness and wasting in shoulders and hips.   Otherwise she again reports frustration with her current relationship as she is not quite attracted to him as she used to be. She loves her husband but says he can be mean to her. She then asks me if more hormone replacement therapy might increase her sexual interest in him.   Otherwise having some nasal congestions without fevers, chills, ear pressure or productive cough for 1 week now. Also requesting more soma Rx for her chronic pain and muscle pain issues.   Past Medical History  Diagnosis Date  . Chronic back pain   . COPD (chronic obstructive pulmonary disease) ([place])   . Anxiety   . Panic attack   . Allergy   . Abscess   . Blood transfusion without reported diagnosis   . Congenital prolapsed rectum   . Numbness and tingling of foot     Right  . Arthritis   . Fibromyalgia   . Chronic kidney disease     history of kidney stone  . Kidney failure     acute - reaction to sulfa drugs  . Hepatitis C   . Wears dentures     full upper and lower    Patient Active Problem List   Diagnosis Date Noted  . Dysuria 06/14/2015  . Muscle wasting 06/14/2015  . Nausea in adult patient 06/14/2015  . Therapeutic  opioid induced constipation 06/14/2015  . Blood in stool   . Benign neoplasm of descending colon   . Rectal prolapse 04/20/2015  . Chronic pain of multiple joints 04/19/2015  . Need for influenza vaccination 04/19/2015  . Left rotator cuff tear arthropathy 03/28/2015  . Abdominal pain 03/10/2012  . False positive serological test for hepatitis C 03/10/2012  . Hypothyroidism 02/29/2012  . Vitamin D deficiency 02/29/2012  . Atrophic vaginitis 02/29/2012  . Hyperlipidemia 02/11/2012  . 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[place]) 01/30/2012  . Major depressive disorder, recurrent episode, 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[place]) 01/29/2012  . Chronic back pain     Social History  Substance Use Topics  . Smoking status: Current Every Day Smoker -- 0.25 packs/day    Types: Cigarettes  . Smokeless tobacco: Never Used     Comment: "smokes very rarely"  . Alcohol Use: 0.0 oz/week    0 Standard drinks or equivalent per week     Comment: seldom     Current outpatient prescriptions:  .  albuterol (PROVENTIL) (2.5 MG/3ML) 0.083% nebulizer solution, Take 2.5 mg by nebulization every 4 (four) hours as needed. For shortness of breath, Disp: , Rfl:  .  alprazolam (XANAX) 2 MG tablet, Take 2 mg by mouth  3 (three) times daily., Disp: , Rfl:  .  amphetamine-dextroamphetamine (ADDERALL) 10 MG tablet, Take 10 mg by mouth 2 (two) times daily., Disp: , Rfl: 0 .  BELSOMRA 20 MG TABS, TAKE 1 TABLET BY MOUTH AT BEDTIME AS DIRECTED, Disp: , Rfl: 4 .  carisoprodol (SOMA) 350 MG tablet, Take 1 tablet (350 mg total) by mouth 3 (three) times daily as needed. For pain, Disp: 100 tablet, Rfl: 0 .  Cyanocobalamin (VITAMIN B 12 PO), Take 1 tablet by mouth daily., Disp: , Rfl:  .  EPIPEN 2-PAK 0.3 MG/0.3ML SOAJ injection, USE AS DIRECTED FOR ANAPYLACTIC REACTION (TO BEE STINGS), Disp: , Rfl: 5 .  Fluticasone Furoate-Vilanterol (BREO ELLIPTA) 100-25 MCG/INH AEPB, Inhale 1 puff into the lungs daily., Disp: 60 each, Rfl: 5 .   hydrOXYzine (ATARAX/VISTARIL) 25 MG tablet, Take 1 tablet (25 mg total) by mouth every 8 (eight) hours as needed., Disp: 90 tablet, Rfl: 5 .  Ipratropium-Albuterol (COMBIVENT RESPIMAT) 20-100 MCG/ACT AERS respimat, Inhale 1-2 puffs into the lungs every 6 (six) hours as needed for wheezing or shortness of breath., Disp: 4 g, Rfl: 3 .  LYRICA 100 MG capsule, Take 1 capsule (100 mg total) by mouth 3 (three) times daily., Disp: 90 capsule, Rfl: 5 .  LYSINE PO, Take 1 tablet by mouth 3 (three) times a week. , Disp: , Rfl:  .  Multiple Vitamin (MULITIVITAMIN WITH MINERALS) TABS, Take 1 tablet by mouth daily., Disp: , Rfl:  .  naloxegol oxalate (MOVANTIK) 12.5 MG TABS tablet, Take 2 tablets (25 mg total) by mouth daily., Disp: 30 tablet, Rfl: 5 .  NASONEX 50 MCG/ACT nasal spray, PLACE 2 SPRAYS EACH NOSTRIL EVERY DAY, Disp: , Rfl: 11 .  OVER THE COUNTER MEDICATION, Take 1 tablet by mouth every morning. Over the counter laxative, Disp: , Rfl:  .  oxyCODONE (ROXICODONE) 15 MG immediate release tablet, Take 15 mg by mouth every 8 (eight) hours as needed. Prescribed by pain management, Disp: , Rfl:  .  polyethylene glycol powder (GLYCOLAX/MIRALAX) powder, Take 17 g by mouth daily., Disp: 850 g, Rfl: 1 .  PREMARIN vaginal cream, Apply 1 gram vaginally at bedtime 2 times a week, Disp: 30 g, Rfl: 5 .  promethazine (PHENERGAN) 25 MG tablet, Take 1 tablet (25 mg total) by mouth every 8 (eight) hours as needed for nausea or vomiting., Disp: 90 tablet, Rfl: 5 .  QUEtiapine (SEROQUEL) 400 MG tablet, TAKE 1 & 1/2 TABLETS BY MOUTH AT BEDTIME, Disp: , Rfl: 3 .  SYMBICORT 160-4.5 MCG/ACT inhaler, INHALE 2 PUFFS INTO THE LUNGS 2 (TWO) TIMES DAILY., Disp: , Rfl: 3 .  vitamin E (VITAMIN E) 400 UNIT capsule, Take 400 Units by mouth daily., Disp: , Rfl:   Allergies  Allergen Reactions  . Bee Venom Anaphylaxis    Bees/wasps/yellow jackets  . Keflex [Cephalexin] Anaphylaxis  . Penicillins Anaphylaxis  . Cyclobenzaprine Other  (See Comments)    Severe constipation  . Methadone Other (See Comments)    Change in mental status  . Nsaids Itching    neurontin Can take some nsaids  . Sulfa Antibiotics Other (See Comments)    Renal failure  . Codeine Itching and Rash  . Hydrocodone Hives  . Tegretol [Carbamazepine] Hives and Rash  . Toradol [Ketorolac Tromethamine] Hives and Rash  . Tramadol Hives and Rash    Review of Systems  CONSTITUTIONAL: No significant weight changes, fever, chills, weakness or fatigue.  SKIN: No rash or itching.  CARDIOVASCULAR: No  chest pain, chest pressure or chest discomfort. No palpitations or edema.  RESPIRATORY: No shortness of breath, cough or sputum.  GASTROINTESTINAL: No anorexia, nausea, vomiting. No changes in bowel habits. No abdominal pain or blood.  GENITOURINARY: No dysuria. No frequency. No discharge.  NEUROLOGICAL: No headache, dizziness, syncope, paralysis, ataxia, numbness or tingling in the extremities. No memory changes. No change in bowel or bladder control.  MUSCULOSKELETAL: Chronic joint pain. Yes muscle pain. HEMATOLOGIC: No anemia, bleeding or bruising.  LYMPHATICS: No enlarged lymph nodes.  PSYCHIATRIC: No change in mood. No change in sleep pattern.  ENDOCRINOLOGIC: No reports of sweating, cold or heat intolerance. No polyuria or polydipsia.     Objective  BP 132/88 mmHg  Pulse 90  Temp(Src) 98.1 F (36.7 C) (Oral)  Resp 16  Wt 150 lb 14.4 oz (68.448 kg)  SpO2 96%  Body mass index is 29.47 kg/(m^2).   Physical Exam  Constitutional: Patient appears well-developed and well-nourished. In no distress.  HEENT:  - Head: Normocephalic and atraumatic.  - Ears: Bilateral TMs gray, no erythema or effusion - Nose: Nasal mucosa moist, boggy - Mouth/Throat: Oropharynx is clear and moist. No tonsillar hypertrophy or erythema. No post nasal drainage.  - Eyes: Conjunctivae clear, EOM movements normal. PERRLA. No scleral icterus.  Neck: Normal  range of motion. Neck supple. No JVD present. No thyromegaly present.  Cardiovascular: Normal rate, regular rhythm and normal heart sounds. No murmur heard.  Pulmonary/Chest: Effort normal and breath sounds normal. No respiratory distress. Musculoskeletal: Normal range of motion bilateral UE and LE, no joint effusions. I see no muscle wasting or fasciculations.  Peripheral vascular: Bilateral LE no edema. Neurological: CN II-XII grossly intact with no focal deficits. Alert and oriented to person, place, and time. Coordination, balance, strength, speech and gait are normal.  Skin: Skin is warm and dry. No rash noted. No erythema.  Psychiatric: Patient has a labile mood and affect, talks fast, crys on and off, happy the next minute. Behavior is stable in office today. Judgment and thought content stable compared to previous visits.   Assessment & Plan  1. Muscle wasting Clinically I do not see any significant weakness or muscle wasting but she would like to rule out muscular dystrophy.  2. Chronic pain of multiple joints Stable.  3. Chronic back pain Refilled per her request.  - carisoprodol (SOMA) 350 MG tablet; Take 1 tablet (350 mg total) by mouth 3 (three) times daily as needed. For pain  Dispense: 100 tablet; Refill: 5  4. Left rotator cuff tear arthropathy Refilled per her request  - carisoprodol (SOMA) 350 MG tablet; Take 1 tablet (350 mg total) by mouth 3 (three) times daily as needed. For pain  Dispense: 100 tablet; Refill: 5  5. Low libido Marriage counseling recommended not changing hormone therapy.  6. Viral URI Symptomatic treatment

## 2015-06-27 ENCOUNTER — Encounter: Payer: Self-pay | Admitting: Family Medicine

## 2015-07-03 ENCOUNTER — Encounter: Payer: Self-pay | Admitting: Neurology

## 2015-07-03 ENCOUNTER — Ambulatory Visit (INDEPENDENT_AMBULATORY_CARE_PROVIDER_SITE_OTHER): Payer: Medicaid Other | Admitting: Neurology

## 2015-07-03 VITALS — BP 183/101 | HR 84 | Ht 60.0 in | Wt 149.0 lb

## 2015-07-03 DIAGNOSIS — R748 Abnormal levels of other serum enzymes: Secondary | ICD-10-CM | POA: Diagnosis not present

## 2015-07-03 DIAGNOSIS — R269 Unspecified abnormalities of gait and mobility: Secondary | ICD-10-CM

## 2015-07-03 DIAGNOSIS — M791 Myalgia, unspecified site: Secondary | ICD-10-CM
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
## 2015-07-04 ENCOUNTER — Telehealth: Payer: Self-pay | Admitting: *Deleted

## 2015-07-04 LAB — CK: CK TOTAL: 148 U/L (ref 24–173)

## 2015-07-04 LAB — VITAMIN B12: Vitamin B-12: 729 pg/mL (ref 211–946)

## 2015-07-04 NOTE — Telephone Encounter (Signed)
-----   Message from Marcial Pacas, MD sent at 07/04/2015  9:06 AM EDT ----- Please call patient for normal laboratory result, repeat CPK was within normal limits 148

## 2015-07-04 NOTE — Telephone Encounter (Signed)
Spoke to [place] Fields - she is aware of results.

## 2015-07-10 ENCOUNTER — Other Ambulatory Visit: Payer: Self-pay | Admitting: Neurology

## 2015-07-10 DIAGNOSIS — M791 Myalgia, unspecified site: Secondary | ICD-10-CM

## 2015-07-10 DIAGNOSIS — R269 Unspecified abnormalities of gait and mobility: Secondary | ICD-10-CM

## 2015-07-10 DIAGNOSIS — R748 Abnormal levels of other serum enzymes: Secondary | ICD-10-CM

## 2015-07-11 ENCOUNTER — Telehealth: Payer: Self-pay | Admitting: Neurology

## 2015-07-11 NOTE — Telephone Encounter (Addendum)
Sharyn Lull, please help me connected.  I have called, is approved,  [id number] MRI cervical and Lumbar wo

## 2015-07-11 NOTE — Telephone Encounter (Signed)
-----   Message from Blenda Peals sent at 07/11/2015  8:30 AM EST ----- Regarding: MRI C-SPINE WO & MRI L-SPINE WO Dr. Krista Blue   Medicaid denied the auth for both orders. Patient is scheduled on 11/16 @ [facility].  I have submitted office notes.  You can call [facility] # [phone] CASE# [id number].  Please advise with auth# or on how to proceed.   Thank you!

## 2015-07-12 ENCOUNTER — Telehealth: Payer: Self-pay

## 2015-07-12 ENCOUNTER — Other Ambulatory Visit: Payer: Self-pay

## 2015-07-12 NOTE — Telephone Encounter (Signed)
Patient called and spoke with Suanne Marker requesting a patch to be used for [place] the mountains since she normally gets sick.  Refill request was sent to Dr. Steele Sizer for approval and submission.

## 2015-07-12 NOTE — Telephone Encounter (Signed)
Suanne Marker called this patient back to inform her that the request for the nausea patch was submitted to Dr. Ancil Boozer and that it might need prior authorization (due to [facility]) which could take a few days, but she was not in (she was out getting minutes for her phone), so Suanne Marker spoke to her significant other. She gave him the option of purchasing the OTC version of the nausea patch from her local pharmacy. He stated that he would let her know.

## 2015-07-15 ENCOUNTER — Telehealth: Payer: Self-pay | Admitting: Neurology

## 2015-07-15 NOTE — Telephone Encounter (Addendum)
I have done peer to peer for MRI of lumbar and cervical, I did not order MRI of left knee

## 2015-07-15 NOTE — Telephone Encounter (Signed)
-----   Message from Blenda Peals sent at 07/15/2015  8:51 AM EST ----- Regarding: MRI KNEE LEFT WO  Dr. Krista Blue,  Jamie Schultz is scheduled to have her MRI KNEE LEFT WO on 11/16 @  along with her other two MRI.  Medicaid is still reviewing the order along with office notes. I have called trying to expedite the auth but they advise me that you can call [facility] # [phone] Case# [id number]  to do peer to peer and they will be able to tell you if it will be authorized or denied.   Please advise with auth or on how to proceed.  Thank you!

## 2015-07-15 NOTE — Telephone Encounter (Signed)
Attempted to set up peer to peer - unable to get it set up today.

## 2015-07-17 ENCOUNTER — Telehealth: Payer: Self-pay | Admitting: Neurology

## 2015-07-17 ENCOUNTER — Ambulatory Visit
Admission: RE | Admit: 2015-07-17 | Discharge: 2015-07-17 | Disposition: A | Discharge status: Home / Self Care | Payer: Medicaid Other | Source: Ambulatory Visit | Attending: Neurology | Admitting: Neurology

## 2015-07-17 DIAGNOSIS — R269 Unspecified abnormalities of gait and mobility: Secondary | ICD-10-CM

## 2015-07-17 DIAGNOSIS — M5030 Other cervical disc degeneration, unspecified cervical region: Secondary | ICD-10-CM | POA: Diagnosis not present

## 2015-07-17 DIAGNOSIS — M791 Myalgia, unspecified site: Secondary | ICD-10-CM

## 2015-07-17 DIAGNOSIS — M4806 Spinal stenosis, lumbar region: Secondary | ICD-10-CM | POA: Diagnosis not present

## 2015-07-17 DIAGNOSIS — R748 Abnormal levels of other serum enzymes: Secondary | ICD-10-CM

## 2015-07-17 DIAGNOSIS — M545 Low back pain: Secondary | ICD-10-CM | POA: Insufficient documentation

## 2015-07-17 DIAGNOSIS — M4802 Spinal stenosis, cervical region: Secondary | ICD-10-CM | POA: Insufficient documentation

## 2015-07-17 DIAGNOSIS — R531 Weakness: Secondary | ICD-10-CM | POA: Insufficient documentation

## 2015-07-17 DIAGNOSIS — M47816 Spondylosis without myelopathy or radiculopathy, lumbar region: Secondary | ICD-10-CM | POA: Insufficient documentation

## 2015-07-17 NOTE — Telephone Encounter (Signed)
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

## 2015-07-17 NOTE — Telephone Encounter (Signed)
I have finished peer to peer for MRI of lumbar and cervical spine,      Expand All Collapse All   Sharyn Lull, please help me connected.  I have called, is approved,  [id number] MRI cervical and Lumbar wo       I do not order MRI of her knee, will not do peer to peer for her MRI of left knee

## 2015-07-17 NOTE — Telephone Encounter (Signed)
[facility] Scheduling [phone] called to advise Dr. Rhea Belton e-signature is needed on order for MRI of Left knee.

## 2015-07-17 NOTE — Progress Notes (Addendum)
I did not order MRI of left knee  Marcial Pacas, M.D. Ph.D.  [facility] [place], [place] [address] Phone: [phone] Fax:      [phone]

## 2015-07-17 NOTE — Telephone Encounter (Signed)
Spoke to patient - she is aware of results and an appt has been scheduled to further discuss.

## 2015-07-24 NOTE — Telephone Encounter (Addendum)
Pt has called and states that she would like a copy of her results sent to her PCP, Dr. Loletta Parish with [facility]. She would also like a copy for herself. Please call and advise [phone]

## 2015-07-24 NOTE — Telephone Encounter (Signed)
Spoke to the patient - results have been faxed to her PCP and a copy has been placed into the mail to her verified home address.

## 2015-07-27 ENCOUNTER — Other Ambulatory Visit: Payer: Self-pay | Admitting: Family Medicine

## 2015-07-31 ENCOUNTER — Ambulatory Visit (INDEPENDENT_AMBULATORY_CARE_PROVIDER_SITE_OTHER): Payer: Medicaid Other | Admitting: Neurology

## 2015-07-31 ENCOUNTER — Encounter: Payer: Self-pay | Admitting: Neurology

## 2015-07-31 VITALS — BP 136/91 | HR 90 | Ht 60.0 in | Wt 149.0 lb

## 2015-07-31 DIAGNOSIS — M4802 Spinal stenosis, cervical region: Secondary | ICD-10-CM | POA: Diagnosis not present

## 2015-07-31 DIAGNOSIS — M4806 Spinal stenosis, lumbar region: Secondary | ICD-10-CM

## 2015-07-31 DIAGNOSIS — M4316 Spondylolisthesis, lumbar region: Secondary | ICD-10-CM | POA: Insufficient documentation

## 2015-07-31 DIAGNOSIS — M48061 Spinal stenosis, lumbar region without neurogenic claudication: Secondary | ICD-10-CM

## 2015-07-31 MED ORDER — OXYCODONE HCL 10 MG PO TABS: 10.0000 mg | ORAL_TABLET | Freq: Three times a day (TID) | ORAL | Status: DC | PRN
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
## 2015-08-05 ENCOUNTER — Telehealth: Payer: Self-pay | Admitting: Neurology

## 2015-08-05 ENCOUNTER — Ambulatory Visit: Payer: Medicaid Other

## 2015-08-05 NOTE — Telephone Encounter (Signed)
Pt called reg referral to Dr Joya Salm. She is wanting to get in with him before Christmas.

## 2015-08-05 NOTE — Telephone Encounter (Addendum)
-----   Message from Blenda Peals sent at 08/02/2015  8:14 AM EST ----- Regarding: MRI LEFT KNEE WO Dr. Krista Blue,  Medicaid denied the MRI of left knee even after submitting clinical information. You can call [place] # [phone] case# [id number] to do peer to peer.  Please advise with auth# or on how to proceed.  Thank you!   I did not order MRI of left knee.  Marcial Pacas, M.D. Ph.D.  [facility] [place], [place] [address] Phone: [phone] Fax:      [phone]

## 2015-08-05 NOTE — Telephone Encounter (Addendum)
There is an order from 11/9 from Dr. Krista Blue on MRI left knee wo. Please advise. Thank you!  Will cancel. It was forwarded by outside provider for me to cosign.

## 2015-08-06 ENCOUNTER — Telehealth: Payer: Self-pay | Admitting: Neurology

## 2015-08-06 ENCOUNTER — Encounter: Payer: Medicaid Other | Admitting: Neurology

## 2015-08-06 NOTE — Telephone Encounter (Signed)
Pt called and would like to know the status of her referral. Please call and advise [phone]

## 2015-08-06 NOTE — Telephone Encounter (Signed)
I have called and spoke to patient Jamie Schultz is going to do everything she can for apt. Before christmas. Patient understood.

## 2015-08-06 NOTE — Telephone Encounter (Signed)
Referral has been sent and Patient also aware.

## 2015-08-12 ENCOUNTER — Telehealth: Payer: Self-pay | Admitting: Neurology

## 2015-08-12 DIAGNOSIS — T402X5A Adverse effect of other opioids, initial encounter: Principal | ICD-10-CM

## 2015-08-12 DIAGNOSIS — K5903 Drug induced constipation: Secondary | ICD-10-CM

## 2015-08-12 DIAGNOSIS — M48061 Spinal stenosis, lumbar region without neurogenic claudication: Secondary | ICD-10-CM

## 2015-08-12 MED ORDER — OXYCODONE HCL 15 MG PO TABS: 15.0000 mg | ORAL_TABLET | Freq: Three times a day (TID) | ORAL | Status: DC | PRN

## 2015-08-12 NOTE — Telephone Encounter (Signed)
Patient complains of low back pain, has used up 90 tablets of oxycodone 10 mg that was prescribed in July 31 2015, she will have decompression surgery at the end of December by Dr. Joya Salm, I wrote oxycodone 15 mg 60 tablets every 8 hours as needed  Further pain management will be by Dr. Joya Salm and pain management after surgery

## 2015-08-15 ENCOUNTER — Other Ambulatory Visit: Payer: Self-pay | Admitting: Neurosurgery

## 2015-08-22 ENCOUNTER — Emergency Department: Payer: Medicaid Other

## 2015-08-22 ENCOUNTER — Encounter: Payer: Self-pay | Admitting: Emergency Medicine

## 2015-08-22 ENCOUNTER — Emergency Department
Admission: EM | Admit: 2015-08-22 | Discharge: 2015-08-22 | Disposition: A | Discharge status: Home / Self Care | Payer: Medicaid Other | Attending: Emergency Medicine | Admitting: Emergency Medicine

## 2015-08-22 DIAGNOSIS — Y9289 Other specified places as the place of occurrence of the external cause: Secondary | ICD-10-CM | POA: Diagnosis not present

## 2015-08-22 DIAGNOSIS — Z79899 Other long term (current) drug therapy: Secondary | ICD-10-CM | POA: Diagnosis not present

## 2015-08-22 DIAGNOSIS — Z7951 Long term (current) use of inhaled steroids: Secondary | ICD-10-CM | POA: Diagnosis not present

## 2015-08-22 DIAGNOSIS — Z79890 Hormone replacement therapy (postmenopausal): Secondary | ICD-10-CM | POA: Insufficient documentation

## 2015-08-22 DIAGNOSIS — W2109XA Struck by other hit or thrown ball, initial encounter: Secondary | ICD-10-CM | POA: Insufficient documentation

## 2015-08-22 DIAGNOSIS — S199XXA Unspecified injury of neck, initial encounter: Secondary | ICD-10-CM | POA: Diagnosis not present

## 2015-08-22 DIAGNOSIS — Y998 Other external cause status: Secondary | ICD-10-CM | POA: Diagnosis not present

## 2015-08-22 DIAGNOSIS — S300XXA Contusion of lower back and pelvis, initial encounter: Secondary | ICD-10-CM | POA: Diagnosis not present

## 2015-08-22 DIAGNOSIS — G8929 Other chronic pain: Secondary | ICD-10-CM | POA: Diagnosis not present

## 2015-08-22 DIAGNOSIS — Y9389 Activity, other specified: Secondary | ICD-10-CM | POA: Insufficient documentation

## 2015-08-22 DIAGNOSIS — Z880 Allergy status to penicillin: Secondary | ICD-10-CM | POA: Diagnosis not present

## 2015-08-22 DIAGNOSIS — F17210 Nicotine dependence, cigarettes, uncomplicated: Secondary | ICD-10-CM | POA: Insufficient documentation

## 2015-08-22 DIAGNOSIS — S3992XA Unspecified injury of lower back, initial encounter: Secondary | ICD-10-CM | POA: Diagnosis present

## 2015-08-22 DIAGNOSIS — S20229A Contusion of unspecified back wall of thorax, initial encounter: Secondary | ICD-10-CM

## 2015-08-22 MED ORDER — OXYCODONE HCL 15 MG PO TABS: 15.0000 mg | ORAL_TABLET | Freq: Three times a day (TID) | ORAL | Status: DC | PRN

## 2015-08-22 MED ORDER — HYDROMORPHONE HCL 1 MG/ML IJ SOLN
0.5000 mg | Freq: Once | INTRAMUSCULAR | Status: AC
  Administered 2015-08-22: 0.5 mg via INTRAMUSCULAR
  Filled 2015-08-22: qty 1
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
## 2015-08-22 NOTE — ED Notes (Signed)
Patient was rummaging in the bedside drawers retrieving a handful of sterile applicators when I walked in, I asked her what she was doing and she stated that she wanted to clean her ears before anyone looked in them.  I told her those are not for cleaning ears and she should not be sticking anything in her ear canal anyway and asked her to put them back. Patient appears anxious.

## 2015-08-22 NOTE — Discharge Instructions (Signed)
Cryotherapy °Cryotherapy means treatment with cold. Ice or gel packs can be used to reduce both pain and swelling. Ice is the most helpful within the first 24 to 48 hours after an injury or flare-up from overusing a muscle or joint. Sprains, strains, spasms, burning pain, shooting pain, and aches can all be eased with ice. Ice can also be used when recovering from surgery. Ice is effective, has very few side effects, and is safe for most people to use. °PRECAUTIONS  °Ice is not a safe treatment option for people with: °· Raynaud phenomenon. This is a condition affecting small blood vessels in the extremities. Exposure to cold may cause your problems to return. °· Cold hypersensitivity. There are many forms of cold hypersensitivity, including: °¨ Cold urticaria. Red, itchy hives appear on the skin when the tissues begin to warm after being iced. °¨ Cold erythema. This is a red, itchy rash caused by exposure to cold. °¨ Cold hemoglobinuria. Red blood cells break down when the tissues begin to warm after being iced. The hemoglobin that carry oxygen are passed into the urine because they cannot combine with blood proteins fast enough. °· Numbness or altered sensitivity in the area being iced. °If you have any of the following conditions, do not use ice until you have discussed cryotherapy with your caregiver: °· Heart conditions, such as arrhythmia, angina, or chronic heart disease. °· High blood pressure. °· Healing wounds or open skin in the area being iced. °· Current infections. °· Rheumatoid arthritis. °· Poor circulation. °· Diabetes. °Ice slows the blood flow in the region it is applied. This is beneficial when trying to stop inflamed tissues from spreading irritating chemicals to surrounding tissues. However, if you expose your skin to cold temperatures for too long or without the proper protection, you can damage your skin or nerves. Watch for signs of skin damage due to cold. °HOME CARE INSTRUCTIONS °Follow  these tips to use ice and cold packs safely. °· Place a dry or damp towel between the ice and skin. A damp towel will cool the skin more quickly, so you may need to shorten the time that the ice is used. °· For a more rapid response, add gentle compression to the ice. °· Ice for no more than 10 to 20 minutes at a time. The bonier the area you are icing, the less time it will take to get the benefits of ice. °· Check your skin after 5 minutes to make sure there are no signs of a poor response to cold or skin damage. °· Rest 20 minutes or more between uses. °· Once your skin is numb, you can end your treatment. You can test numbness by very lightly touching your skin. The touch should be so light that you do not see the skin dimple from the pressure of your fingertip. When using ice, most people will feel these normal sensations in this order: cold, burning, aching, and numbness. °· Do not use ice on someone who cannot communicate their responses to pain, such as small children or people with dementia. °HOW TO MAKE AN ICE PACK °Ice packs are the most common way to use ice therapy. Other methods include ice massage, ice baths, and cryosprays. Muscle creams that cause a cold, tingly feeling do not offer the same benefits that ice offers and should not be used as a substitute unless recommended by your caregiver. °To make an ice pack, do one of the following: °· Place crushed ice or a   bag of frozen vegetables in a sealable plastic bag. Squeeze out the excess air. Place this bag inside another plastic bag. Slide the bag into a pillowcase or place a damp towel between your skin and the bag.  Mix 3 parts water with 1 part rubbing alcohol. Freeze the mixture in a sealable plastic bag. When you remove the mixture from the freezer, it will be slushy. Squeeze out the excess air. Place this bag inside another plastic bag. Slide the bag into a pillowcase or place a damp towel between your skin and the bag. SEEK MEDICAL CARE  IF:  You develop white spots on your skin. This may give the skin a blotchy (mottled) appearance.  Your skin turns blue or pale.  Your skin becomes waxy or hard.  Your swelling gets worse. MAKE SURE YOU:   Understand these instructions.  Will watch your condition.  Will get help right away if you are not doing well or get worse.   This information is not intended to replace advice given to you by your health care provider. Make sure you discuss any questions you have with your health care provider.   Document Released: 04/13/2011 Document Revised: 09/07/2014 Document Reviewed: 04/13/2011 Elsevier Interactive Patient Education 2016 [place] A contusion is a deep bruise. Contusions happen when an injury causes bleeding under the skin. Symptoms of bruising include pain, swelling, and discolored skin. The skin may turn blue, purple, or yellow. HOME CARE   Rest the injured area.  If told, put ice on the injured area.  Put ice in a plastic bag.  Place a towel between your skin and the bag.  Leave the ice on for 20 minutes, 2-3 times per day.  If told, put light pressure (compression) on the injured area using an elastic bandage. Make sure the bandage is not too tight. Remove it and put it back on as told by your doctor.  If possible, raise (elevate) the injured area above the level of your heart while you are sitting or lying down.  Take over-the-counter and prescription medicines only as told by your doctor. GET HELP IF:  Your symptoms do not get better after several days of treatment.  Your symptoms get worse.  You have trouble moving the injured area. GET HELP RIGHT AWAY IF:   You have very bad pain.  You have a loss of feeling (numbness) in a hand or foot.  Your hand or foot turns pale or cold.   This information is not intended to replace advice given to you by your health care provider. Make sure you discuss any questions you have with your  health care provider.   Document Released: 02/03/2008 Document Revised: 05/08/2015 Document Reviewed: 01/02/2015 Elsevier Interactive Patient Education 2016 [place] will  need to contact your  doctor in [place] for any further pain medication.  Dr. Rhea Belton office notes indicate that you received a prescription for 60 tablets on 12/12.  May use ice to your back as needed or heat which ever feels the best. Prescription for 3 tablets was written tonight until he can talk to your doctor tomorrow.

## 2015-08-22 NOTE — ED Notes (Signed)
Pt presents with back pain after a bowling ball fell on her back. No bruising noted.

## 2015-08-23 ENCOUNTER — Telehealth: Payer: Self-pay | Admitting: Family Medicine

## 2015-08-23 NOTE — Telephone Encounter (Signed)
Informed patient Dr. Ancil Boozer would not be able to give her anything without being seen. Patient states she has tried Ice, Heat and muscle cream with no relief but is unable to take Tylenol or Ibuprofen due to it making her split up blood. Also that her the ED only gave her a 1 day supply and her pain doctor states they were not able to fill any medication due to her going to have back surgery soon.

## 2015-08-23 NOTE — Telephone Encounter (Signed)
Sorry, she should have gotten from [facility], I can't rx without having seen her

## 2015-08-23 NOTE — Telephone Encounter (Signed)
Dr Nadine Counts patient: called stated that her 14 pound bowling ball fell in the middle of her back. She went to the hospital and they found 2 slipped disc. She is asking for a 3 day supply 20 mg (2 pills per day) of pain medication. States that she will not be able to see Dr Starla Link until next week and he is not able to write her a prescription until he see her next week. She ask that you please help her. She want something to help her get through the holiday.

## 2015-08-27 ENCOUNTER — Telehealth: Payer: Self-pay | Admitting: Family Medicine

## 2015-08-27 NOTE — Telephone Encounter (Signed)
Patient called wanting to know if she could get at least 5 days of pain medication and if we could authorize an early refill. Patient was informed no pain meds will be given but that the early refill has been authorized. Contacted [facility] and spoke to [facility] the pharmacist with [facility].  Patient picked: 07/09/15 patient picked up a 33 day supply (100 pills) 07/31/15 patient picked up a 33 day supply (100 pills)  Threasa Beards stated that I should run this by her PCP prior to them filling this early again, especially since she has been a problem with them. She stated that anyone could speak to her or Clair Gulling regarding this patient.

## 2015-08-27 NOTE — Telephone Encounter (Signed)
Patient is wanting know if she could fill the Carisoprodol early.  As of right now the fill date is on 08/30/15.  She is also wanting to get some pain pills to last her for five days.  She is scheduled for surgery on 09/03/15, but the surgeon is not going to give her pain medication before her surgery.

## 2015-08-27 NOTE — Telephone Encounter (Signed)
Call her pharmacy and authorize early refill of Carisoprodol. Surgeons usually give pain medication after surgery not before. I will not provide pain medication RX.

## 2015-08-28 ENCOUNTER — Emergency Department (HOSPITAL_COMMUNITY)
Admission: EM | Admit: 2015-08-28 | Discharge: 2015-08-28 | Disposition: A | Discharge status: Home / Self Care | Payer: Medicaid Other | Attending: Emergency Medicine | Admitting: Emergency Medicine

## 2015-08-28 ENCOUNTER — Encounter (HOSPITAL_COMMUNITY): Payer: Self-pay

## 2015-08-28 ENCOUNTER — Encounter (HOSPITAL_COMMUNITY)
Admission: RE | Admit: 2015-08-28 | Discharge: 2015-08-28 | Disposition: A | Discharge status: Home / Self Care | Payer: Medicaid Other | Source: Ambulatory Visit | Attending: Neurosurgery | Admitting: Neurosurgery

## 2015-08-28 ENCOUNTER — Encounter (HOSPITAL_COMMUNITY): Payer: Self-pay | Admitting: *Deleted

## 2015-08-28 ENCOUNTER — Encounter: Payer: Self-pay | Admitting: Family Medicine

## 2015-08-28 DIAGNOSIS — Z7951 Long term (current) use of inhaled steroids: Secondary | ICD-10-CM | POA: Insufficient documentation

## 2015-08-28 DIAGNOSIS — Z8701 Personal history of pneumonia (recurrent): Secondary | ICD-10-CM | POA: Insufficient documentation

## 2015-08-28 DIAGNOSIS — Z8619 Personal history of other infectious and parasitic diseases: Secondary | ICD-10-CM | POA: Insufficient documentation

## 2015-08-28 DIAGNOSIS — G8929 Other chronic pain: Secondary | ICD-10-CM | POA: Diagnosis not present

## 2015-08-28 DIAGNOSIS — Z87891 Personal history of nicotine dependence: Secondary | ICD-10-CM | POA: Diagnosis not present

## 2015-08-28 DIAGNOSIS — M549 Dorsalgia, unspecified: Secondary | ICD-10-CM | POA: Diagnosis not present

## 2015-08-28 DIAGNOSIS — Z872 Personal history of diseases of the skin and subcutaneous tissue: Secondary | ICD-10-CM | POA: Insufficient documentation

## 2015-08-28 DIAGNOSIS — Z79899 Other long term (current) drug therapy: Secondary | ICD-10-CM | POA: Insufficient documentation

## 2015-08-28 DIAGNOSIS — M542 Cervicalgia: Secondary | ICD-10-CM | POA: Insufficient documentation

## 2015-08-28 DIAGNOSIS — Z8614 Personal history of Methicillin resistant Staphylococcus aureus infection: Secondary | ICD-10-CM | POA: Diagnosis not present

## 2015-08-28 DIAGNOSIS — M797 Fibromyalgia: Secondary | ICD-10-CM | POA: Insufficient documentation

## 2015-08-28 DIAGNOSIS — Q438 Other specified congenital malformations of intestine: Secondary | ICD-10-CM | POA: Diagnosis not present

## 2015-08-28 DIAGNOSIS — J449 Chronic obstructive pulmonary disease, unspecified: Secondary | ICD-10-CM | POA: Diagnosis not present

## 2015-08-28 DIAGNOSIS — Z01818 Encounter for other preprocedural examination: Secondary | ICD-10-CM | POA: Insufficient documentation

## 2015-08-28 DIAGNOSIS — M4802 Spinal stenosis, cervical region: Secondary | ICD-10-CM | POA: Insufficient documentation

## 2015-08-28 DIAGNOSIS — Z01812 Encounter for preprocedural laboratory examination: Secondary | ICD-10-CM | POA: Diagnosis not present

## 2015-08-28 DIAGNOSIS — N189 Chronic kidney disease, unspecified: Secondary | ICD-10-CM | POA: Insufficient documentation

## 2015-08-28 DIAGNOSIS — M1990 Unspecified osteoarthritis, unspecified site: Secondary | ICD-10-CM | POA: Insufficient documentation

## 2015-08-28 DIAGNOSIS — Z880 Allergy status to penicillin: Secondary | ICD-10-CM | POA: Insufficient documentation

## 2015-08-28 DIAGNOSIS — F419 Anxiety disorder, unspecified: Secondary | ICD-10-CM | POA: Insufficient documentation

## 2015-08-28 HISTORY — DX: Carrier or suspected carrier of methicillin resistant Staphylococcus aureus: Z22.322

## 2015-08-28 HISTORY — DX: Pneumonia, unspecified organism: J18.9

## 2015-08-28 LAB — CBC
HEMATOCRIT: 40.4 % (ref 36.0–46.0)
HEMOGLOBIN: 13.5 g/dL (ref 12.0–15.0)
MCH: 31.3 pg (ref 26.0–34.0)
MCHC: 33.4 g/dL (ref 30.0–36.0)
MCV: 93.7 fL (ref 78.0–100.0)
PLATELETS: 203 10*3/uL (ref 150–400)
RBC: 4.31 MIL/uL (ref 3.87–5.11)
RDW: 13.9 % (ref 11.5–15.5)
WBC: 9.3 10*3/uL (ref 4.0–10.5)

## 2015-08-28 LAB — COMPREHENSIVE METABOLIC PANEL
ALT: 56 U/L — AB (ref 14–54)
ANION GAP: 6 (ref 5–15)
AST: 113 U/L — ABNORMAL HIGH (ref 15–41)
Albumin: 3.7 g/dL (ref 3.5–5.0)
Alkaline Phosphatase: 78 U/L (ref 38–126)
BUN: <5 mg/dL — ABNORMAL LOW (ref 6–20)
CHLORIDE: 109 mmol/L (ref 101–111)
CO2: 25 mmol/L (ref 22–32)
Calcium: 10.1 mg/dL (ref 8.9–10.3)
Creatinine, Ser: 1.48 mg/dL — ABNORMAL HIGH (ref 0.44–1.00)
GFR, EST AFRICAN AMERICAN: 45 mL/min — AB (ref >60)
GFR, EST NON AFRICAN AMERICAN: 38 mL/min — AB (ref >60)
Glucose, Bld: 83 mg/dL (ref 65–99)
POTASSIUM: 4 mmol/L (ref 3.5–5.1)
SODIUM: 140 mmol/L (ref 135–145)
Total Bilirubin: 0.4 mg/dL (ref 0.3–1.2)
Total Protein: 6.6 g/dL (ref 6.5–8.1)

## 2015-08-28 MED ORDER — HYDROMORPHONE HCL 1 MG/ML IJ SOLN
1.0000 mg | Freq: Once | INTRAMUSCULAR | Status: AC
  Administered 2015-08-28: 1 mg via INTRAMUSCULAR
  Filled 2015-08-28: qty 1

## 2015-08-28 NOTE — Telephone Encounter (Signed)
Requesting a return call. She is requesting a refill on Soma.

## 2015-08-28 NOTE — Discharge Instructions (Signed)
It is important for you to follow-up with your neurosurgeon or your primary care doctor for further evaluation and management of your symptoms. Return to ED for any new or worsening symptoms.

## 2015-08-28 NOTE — ED Notes (Signed)
Pt asking for pain meds

## 2015-08-28 NOTE — Progress Notes (Signed)
Pt denies SOB, chest pain, and being under the care of a cardiologist. Pt denies having a cardiac cath but stated that Dr. Raul Del of [facility] in [place], [place] did both and echo and a stress test; records were requested. Pt stated that both an EKG and chest x ray were done at [facility]; records were also requested. Pt chart forwarded to anesthesia to review abnormal labs ( AST)

## 2015-08-28 NOTE — Telephone Encounter (Signed)
Authorized early refill to pharmacy.

## 2015-08-28 NOTE — ED Notes (Signed)
Pt reports neck and back pain. Pt states that she is scheduled for surgery on Jan. 3. Pt was told by her surgeon that he would not prescribe pain medications prior to surgery. Denies bowel or bladder changes

## 2015-08-28 NOTE — Telephone Encounter (Signed)
Per the request of Dr. Nadine Counts and Radene Knee, spoke with pharmacist Threasa Beards and gave her permission to fill 2 days early but no more after this. [facility] informed me that they have been going through these issues for about 10 years now and is seriously thinking about discharging her as a customer due to the constant harrassment and lies.   Dr. Nadine Counts was informed.
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
## 2015-08-28 NOTE — Pre-Procedure Instructions (Addendum)
Jamie [facility]ultz  08/28/2015      [id number] - [facility], [place] - [address] [address] Phone: [phone] Fax: [phone]   Your procedure is scheduled on Tuesday, September 03, 2015.  Report to [facility] Admitting at 9:45 A.M.  Call this number if you have problems the morning of surgery:  [phone]   Remember:  Do not eat food or drink liquids after midnight Monday, September 02, 2015  Take these medicines the morning of surgery with A SIP OF WATER : alprazolam (XANAX), LYRICA, Fluticasone Furoate-Vilanterol (BREO ELLIPTA) inhaler, NASONEX  nasal spray  if needed:  Oxycodone OR carisoprodol (SOMA) for pain,  promethazine (PHENERGAN) for nausea or vomiting, albuterol (PROVENTIL) nebulizer for shortness of breath Ipratropium-Albuterol (COMBIVENT RESPIMAT) inhaler for wheezing or shortness of breath ( bring inhaler in on day of surgery), Epipen for Bee stings  Stop taking Aspirin, vitamins, fish oil, and herbal medications such as Lysine. Do not take any NSAIDs ie: Ibuprofen, Advil, Naproxen or any medication containing Aspirin; stop now.   Do not wear jewelry, make-up or nail polish.  Do not wear lotions, powders, or perfumes.  You may not wear deodorant.  Do not shave 48 hours prior to surgery.    Do not bring valuables to the [facility].  [facility] is not responsible for any belongings or valuables.  Contacts, dentures or bridgework may not be worn into surgery.  Leave your suitcase in the car.  After surgery it may be brought to your room.  For patients admitted to the [facility], discharge time will be determined by your treatment team.  Patients discharged the day of surgery will not be allowed to drive home.   Name and phone number of your driver:  Special instructions: [facility] - Preparing for Surgery  Before surgery, you can play an important role.  Because skin is not sterile, your skin needs to be as free of germs as possible.   You can reduce the number of germs on you skin by washing with CHG (chlorahexidine gluconate) soap before surgery.  CHG is an antiseptic cleaner which kills germs and bonds with the skin to continue killing germs even after washing.  Please DO NOT use if you have an allergy to CHG or antibacterial soaps.  If your skin becomes reddened/irritated stop using the CHG and inform your nurse when you arrive at Short Stay.  Do not shave (including legs and underarms) for at least 48 hours prior to the first CHG shower.  You may shave your face.  Please follow these instructions carefully:   1.  Shower with CHG Soap the night before surgery and the morning of Surgery.  2.  If you choose to wash your hair, wash your hair first as usual with your normal shampoo.  3.  After you shampoo, rinse your hair and body thoroughly to remove the Shampoo.  4.  Use CHG as you would any other liquid soap.  You can apply chg directly  to the skin and wash gently with scrungie or a clean washcloth.  5.  Apply the CHG Soap to your body ONLY FROM THE NECK DOWN.  Do not use on open wounds or open sores.  Avoid contact with your eyes, ears, mouth and genitals (private parts).  Wash genitals (private parts) with your normal soap.  6.  Wash thoroughly, paying special attention to the area where your surgery will be performed.  7.  Thoroughly rinse your body with warm water from the neck down.  8.  DO NOT shower/wash with your normal soap after using and rinsing off the CHG Soap.  9.  Pat yourself dry with a clean towel.            10.  Wear clean pajamas.            11.  Place clean sheets on your bed the night of your first shower and do not sleep with pets.  Day of Surgery  Do not apply any lotions/deodorants the morning of surgery.  Please wear clean clothes to the [facility].  Please read over the following fact sheets that you were given. Pain Booklet, Coughing and Deep Breathing, MRSA Information and  Surgical Site Infection Prevention

## 2015-08-28 NOTE — ED Notes (Signed)
Pt ambulated to bathroom with spouse.

## 2015-08-29 LAB — SURGICAL PCR SCREEN
MRSA, PCR: POSITIVE — AB
Staphylococcus aureus: POSITIVE — AB
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
## 2015-08-29 NOTE — Progress Notes (Signed)
Pt spouse called and stated that Mupirocin was not called in. Spoke with Loma Sousa at [employer] in [place] and  verified that prescription for Mupirocin was called in and ready yesterday at 7:25 P.M.. Pt made aware, prescription is available for pick up.

## 2015-08-30 ENCOUNTER — Encounter: Payer: Self-pay | Admitting: Emergency Medicine

## 2015-08-30 ENCOUNTER — Emergency Department
Admission: EM | Admit: 2015-08-30 | Discharge: 2015-08-30 | Disposition: A | Discharge status: Home / Self Care | Payer: Medicaid Other | Attending: Emergency Medicine | Admitting: Emergency Medicine

## 2015-08-30 DIAGNOSIS — H109 Unspecified conjunctivitis: Secondary | ICD-10-CM | POA: Insufficient documentation

## 2015-08-30 DIAGNOSIS — Z79899 Other long term (current) drug therapy: Secondary | ICD-10-CM | POA: Diagnosis not present

## 2015-08-30 DIAGNOSIS — M549 Dorsalgia, unspecified: Secondary | ICD-10-CM | POA: Insufficient documentation

## 2015-08-30 DIAGNOSIS — Z792 Long term (current) use of antibiotics: Secondary | ICD-10-CM | POA: Diagnosis not present

## 2015-08-30 DIAGNOSIS — Z87891 Personal history of nicotine dependence: Secondary | ICD-10-CM | POA: Diagnosis not present

## 2015-08-30 DIAGNOSIS — Z7951 Long term (current) use of inhaled steroids: Secondary | ICD-10-CM | POA: Insufficient documentation

## 2015-08-30 DIAGNOSIS — G8929 Other chronic pain: Secondary | ICD-10-CM | POA: Diagnosis not present

## 2015-08-30 DIAGNOSIS — M542 Cervicalgia: Secondary | ICD-10-CM | POA: Diagnosis not present

## 2015-08-30 DIAGNOSIS — H5713 Ocular pain, bilateral: Secondary | ICD-10-CM | POA: Diagnosis present

## 2015-08-30 DIAGNOSIS — Z880 Allergy status to penicillin: Secondary | ICD-10-CM | POA: Diagnosis not present

## 2015-08-30 MED ORDER — TETRACAINE HCL 0.5 % OP SOLN: 1.0000 [drp] | Freq: Once | OPHTHALMIC | Status: DC

## 2015-08-30 MED ORDER — TETRACAINE HCL 0.5 % OP SOLN
OPHTHALMIC | Status: AC
  Filled 2015-08-30: qty 2

## 2015-08-30 MED ORDER — TOBRAMYCIN 0.3 % OP SOLN
2.0000 [drp] | OPHTHALMIC | Status: DC
Start: 2015-08-30 — End: 2015-10-30

## 2015-08-30 MED ORDER — FLUORESCEIN SODIUM 1 MG OP STRP
ORAL_STRIP | OPHTHALMIC | Status: AC
  Administered 2015-08-30: 12:00:00
  Filled 2015-08-30: qty 1

## 2015-08-30 MED ORDER — EYE WASH OPHTH SOLN
OPHTHALMIC | Status: AC
  Filled 2015-08-30: qty 118

## 2015-08-30 NOTE — ED Notes (Signed)
Pt states she has not been able to get her contacts out for the past 48 days. She also states she was here recently for pain medication but was told she could not be helped until the 30th. Having surgery on her neck 1/6. She also states she ran out of pain medication last night.

## 2015-08-30 NOTE — Progress Notes (Signed)
Called Dr. Allie Dimmer office to follow-up on pending medical clearance for patient.  Patient has appointment on January 3rd, the same day of her surgery.  Have spoken with Janett Billow at Dr. Harley Hallmark office and she said patient's surgery has been rescheduled for January 10th.

## 2015-08-30 NOTE — Discharge Instructions (Signed)
Follow-up with your doctor for any continued problems. You got may also go to [facility] if any continued problems with your eyes.
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
## 2015-09-03 ENCOUNTER — Ambulatory Visit: Payer: Medicaid Other | Admitting: Family Medicine

## 2015-09-04 ENCOUNTER — Encounter: Payer: Self-pay | Admitting: Family Medicine

## 2015-09-04 ENCOUNTER — Ambulatory Visit (INDEPENDENT_AMBULATORY_CARE_PROVIDER_SITE_OTHER): Payer: Medicaid Other | Admitting: Family Medicine

## 2015-09-04 ENCOUNTER — Telehealth: Payer: Self-pay

## 2015-09-04 VITALS — BP 163/98 | HR 97 | Temp 98.1°F | Resp 18 | Ht 60.0 in | Wt 155.2 lb

## 2015-09-04 DIAGNOSIS — R944 Abnormal results of kidney function studies: Secondary | ICD-10-CM | POA: Diagnosis not present

## 2015-09-04 DIAGNOSIS — R1031 Right lower quadrant pain: Secondary | ICD-10-CM | POA: Insufficient documentation

## 2015-09-04 DIAGNOSIS — R110 Nausea: Secondary | ICD-10-CM

## 2015-09-04 DIAGNOSIS — R945 Abnormal results of liver function studies: Secondary | ICD-10-CM

## 2015-09-04 DIAGNOSIS — R7989 Other specified abnormal findings of blood chemistry: Secondary | ICD-10-CM

## 2015-09-04 DIAGNOSIS — G8929 Other chronic pain: Secondary | ICD-10-CM

## 2015-09-04 DIAGNOSIS — M2550 Pain in unspecified joint: Secondary | ICD-10-CM | POA: Diagnosis not present

## 2015-09-04 LAB — CBC WITH DIFFERENTIAL/PLATELET
BASOS ABS: 0.1 10*3/uL (ref 0.0–0.2)
Basos: 1 %
EOS (ABSOLUTE): 0.3 10*3/uL (ref 0.0–0.4)
Eos: 3 %
Hematocrit: 43.5 % (ref 34.0–46.6)
Hemoglobin: 14.8 g/dL (ref 11.1–15.9)
IMMATURE GRANS (ABS): 0 10*3/uL (ref 0.0–0.1)
IMMATURE GRANULOCYTES: 0 %
LYMPHS: 9 %
Lymphocytes Absolute: 0.9 10*3/uL (ref 0.7–3.1)
MCH: 31.6 pg (ref 26.6–33.0)
MCHC: 34 g/dL (ref 31.5–35.7)
MCV: 93 fL (ref 79–97)
MONOCYTES: 7 %
Monocytes Absolute: 0.7 10*3/uL (ref 0.1–0.9)
NEUTROS ABS: 7.9 10*3/uL — AB (ref 1.4–7.0)
NEUTROS PCT: 80 %
PLATELETS: 286 10*3/uL (ref 150–379)
RBC: 4.68 x10E6/uL (ref 3.77–5.28)
RDW: 14.2 % (ref 12.3–15.4)
WBC: 9.9 10*3/uL (ref 3.4–10.8)

## 2015-09-04 LAB — COMPREHENSIVE METABOLIC PANEL
ALK PHOS: 98 IU/L (ref 39–117)
ALT: 24 IU/L (ref 0–32)
AST: 19 IU/L (ref 0–40)
Albumin/Globulin Ratio: 1.6 (ref 1.1–2.5)
Albumin: 4.4 g/dL (ref 3.5–5.5)
BUN/Creatinine Ratio: 11 (ref 9–23)
BUN: 9 mg/dL (ref 6–24)
CHLORIDE: 101 mmol/L (ref 96–106)
CO2: 20 mmol/L (ref 18–29)
CREATININE: 0.79 mg/dL (ref 0.57–1.00)
Calcium: 10.3 mg/dL — ABNORMAL HIGH (ref 8.7–10.2)
GFR calc non Af Amer: 84 mL/min/{1.73_m2} (ref >59)
GFR, EST AFRICAN AMERICAN: 97 mL/min/{1.73_m2} (ref >59)
GLUCOSE: 103 mg/dL — AB (ref 65–99)
Globulin, Total: 2.7 g/dL (ref 1.5–4.5)
Potassium: 4 mmol/L (ref 3.5–5.2)
Sodium: 141 mmol/L (ref 134–144)
TOTAL PROTEIN: 7.1 g/dL (ref 6.0–8.5)

## 2015-09-04 LAB — URIC ACID: URIC ACID: 4.6 mg/dL (ref 2.5–7.1)

## 2015-09-04 LAB — LIPASE: Lipase: 22 U/L (ref 0–59)

## 2015-09-04 LAB — RHEUMATOID FACTOR

## 2015-09-04 LAB — AMYLASE: AMYLASE: 64 U/L (ref 31–124)

## 2015-09-04 MED ORDER — PROMETHAZINE HCL 25 MG/ML IJ SOLN: 25.0000 mg | Freq: Once | INTRAMUSCULAR | Status: DC

## 2015-09-04 MED ORDER — ONDANSETRON 8 MG PO TBDP: 8.0000 mg | ORAL_TABLET | Freq: Three times a day (TID) | ORAL | Status: DC | PRN
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
## 2015-09-04 NOTE — Telephone Encounter (Signed)
Called the pharmacy, they stated she was given rx for Hydrocodone yesterday from neurosurgery.  They also stated they have up to 1 week to place it in the drug look up database.

## 2015-09-04 NOTE — Telephone Encounter (Signed)
Good to know

## 2015-09-04 NOTE — Telephone Encounter (Signed)
Please let patient know that her lab results have normalized back to her baseline kidney function and liver enzymes. Her lab work was negative for pancreatitis, rheumatological disease, gout or infection at this time. She is cleared to proceed with surgery, have her call her neurosurgical specialist. We can fax our results if they need.   Verified with her pharmacy that she picked up hydrocodone pain medication yesterday and it may take up to 1 week to show up on [place] drug registry. Pain management to be discussed with Neurosurgical specialist at this point as she is cleared for surgery.

## 2015-09-04 NOTE — Telephone Encounter (Signed)
Patient notified

## 2015-09-05 ENCOUNTER — Other Ambulatory Visit: Payer: Self-pay | Admitting: Neurosurgery

## 2015-09-05 ENCOUNTER — Telehealth: Payer: Self-pay | Admitting: Family Medicine

## 2015-09-05 LAB — DRUG SCREEN, URINE
AMPHETAMINES, URINE: NEGATIVE ng/mL
BARBITURATE SCREEN URINE: NEGATIVE ng/mL
Benzodiazepine Quant, Ur: POSITIVE ng/mL
Cannabinoid Quant, Ur: POSITIVE ng/mL
Cocaine (Metab.): NEGATIVE ng/mL
OPIATE QUANT UR: POSITIVE ng/mL
PCP Quant, Ur: NEGATIVE ng/mL

## 2015-09-05 LAB — PLEASE NOTE

## 2015-09-05 LAB — ANA: ANA: NEGATIVE

## 2015-09-05 NOTE — Telephone Encounter (Signed)
Patient states that Dr Joya Salm (neurosurgeon) prescribed her hydrocodone 5mg , however she is not able to take it because it makes her itch. She however would like a prescription for oxycodone 15mg  without tylenol. She would like to take one tablet 3 times daily and she only need 15 pills (enough to last until her surgery next week).

## 2015-09-05 NOTE — Telephone Encounter (Signed)
Please convey to Jamie Schultz that her UDS has shown up positive for Marijuana, consistent with her urine drug screens from [facility]. Consistent use of illicit drugs despite counseling disqualifies Jamie Schultz from receiving any controlled substance prescriptions from our office. This includes no further prescriptions of Soma and no new prescriptions for opioids as she is requesting.

## 2015-09-09 MED ORDER — VANCOMYCIN HCL IN DEXTROSE 1-5 GM/200ML-% IV SOLN
1000.0000 mg | INTRAVENOUS | Status: AC
  Administered 2015-09-10: 1000 mg via INTRAVENOUS
  Filled 2015-09-09: qty 200

## 2015-09-09 NOTE — Progress Notes (Signed)
Mrs Trunzo called to ask what time she is to arrive on Tuesday.  Dr Joya Salm is her surgeon, I informed her that someone from the Dr's office will call today with arrival time.  I asked Mrs Maland if she still has instructions and surgical soap from PAT appointment, patient did not think so but her husband found the instructions while patient was on the phone.  Mrs Shortall had not other questions.

## 2015-09-10 ENCOUNTER — Inpatient Hospital Stay (HOSPITAL_COMMUNITY): Payer: Medicaid Other

## 2015-09-10 ENCOUNTER — Inpatient Hospital Stay (HOSPITAL_COMMUNITY): Payer: Medicaid Other | Admitting: Certified Registered Nurse Anesthetist

## 2015-09-10 ENCOUNTER — Encounter (HOSPITAL_COMMUNITY)
Admission: RE | Disposition: A | Discharge status: Home / Self Care | Payer: Self-pay | Source: Ambulatory Visit | Attending: Neurosurgery

## 2015-09-10 ENCOUNTER — Inpatient Hospital Stay (HOSPITAL_COMMUNITY): Payer: Medicaid Other | Admitting: Vascular Surgery

## 2015-09-10 ENCOUNTER — Inpatient Hospital Stay (HOSPITAL_COMMUNITY)
Admission: RE | Admit: 2015-09-10 | Discharge: 2015-09-13 | DRG: 473 | Disposition: A | Discharge status: Home / Self Care | Payer: Medicaid Other | Source: Ambulatory Visit | Attending: Neurosurgery | Admitting: Neurosurgery

## 2015-09-10 ENCOUNTER — Encounter (HOSPITAL_COMMUNITY): Payer: Self-pay

## 2015-09-10 DIAGNOSIS — Z825 Family history of asthma and other chronic lower respiratory diseases: Secondary | ICD-10-CM | POA: Diagnosis not present

## 2015-09-10 DIAGNOSIS — Z91030 Bee allergy status: Secondary | ICD-10-CM | POA: Diagnosis not present

## 2015-09-10 DIAGNOSIS — Z87892 Personal history of anaphylaxis: Secondary | ICD-10-CM

## 2015-09-10 DIAGNOSIS — M810 Age-related osteoporosis without current pathological fracture: Secondary | ICD-10-CM | POA: Diagnosis present

## 2015-09-10 DIAGNOSIS — Z8614 Personal history of Methicillin resistant Staphylococcus aureus infection: Secondary | ICD-10-CM | POA: Diagnosis not present

## 2015-09-10 DIAGNOSIS — M4802 Spinal stenosis, cervical region: Secondary | ICD-10-CM | POA: Diagnosis not present

## 2015-09-10 DIAGNOSIS — Z823 Family history of stroke: Secondary | ICD-10-CM | POA: Diagnosis not present

## 2015-09-10 DIAGNOSIS — J449 Chronic obstructive pulmonary disease, unspecified: Secondary | ICD-10-CM | POA: Diagnosis present

## 2015-09-10 DIAGNOSIS — M4316 Spondylolisthesis, lumbar region: Secondary | ICD-10-CM | POA: Diagnosis present

## 2015-09-10 DIAGNOSIS — F419 Anxiety disorder, unspecified: Secondary | ICD-10-CM | POA: Diagnosis present

## 2015-09-10 DIAGNOSIS — Z882 Allergy status to sulfonamides status: Secondary | ICD-10-CM | POA: Diagnosis not present

## 2015-09-10 DIAGNOSIS — Z87891 Personal history of nicotine dependence: Secondary | ICD-10-CM

## 2015-09-10 DIAGNOSIS — Z880 Allergy status to penicillin: Secondary | ICD-10-CM | POA: Diagnosis not present

## 2015-09-10 DIAGNOSIS — Z885 Allergy status to narcotic agent status: Secondary | ICD-10-CM

## 2015-09-10 DIAGNOSIS — F410 Panic disorder [episodic paroxysmal anxiety] without agoraphobia: Secondary | ICD-10-CM | POA: Diagnosis not present

## 2015-09-10 DIAGNOSIS — Z981 Arthrodesis status: Secondary | ICD-10-CM

## 2015-09-10 DIAGNOSIS — M5412 Radiculopathy, cervical region: Secondary | ICD-10-CM | POA: Diagnosis present

## 2015-09-10 DIAGNOSIS — Z881 Allergy status to other antibiotic agents status: Secondary | ICD-10-CM

## 2015-09-10 DIAGNOSIS — Z801 Family history of malignant neoplasm of trachea, bronchus and lung: Secondary | ICD-10-CM

## 2015-09-10 DIAGNOSIS — Z8249 Family history of ischemic heart disease and other diseases of the circulatory system: Secondary | ICD-10-CM

## 2015-09-10 DIAGNOSIS — Z419 Encounter for procedure for purposes other than remedying health state, unspecified: Secondary | ICD-10-CM

## 2015-09-10 DIAGNOSIS — Z888 Allergy status to other drugs, medicaments and biological substances status: Secondary | ICD-10-CM | POA: Diagnosis not present

## 2015-09-10 DIAGNOSIS — G8929 Other chronic pain: Secondary | ICD-10-CM | POA: Diagnosis present

## 2015-09-10 HISTORY — PX: ANTERIOR CERVICAL DECOMP/DISCECTOMY FUSION: SHX1161

## 2015-09-10 LAB — COMPREHENSIVE METABOLIC PANEL
ALBUMIN: 3.8 g/dL (ref 3.5–5.0)
ALK PHOS: 94 U/L (ref 38–126)
ALT: 19 U/L (ref 14–54)
ANION GAP: 15 (ref 5–15)
AST: 22 U/L (ref 15–41)
BUN: 10 mg/dL (ref 6–20)
CALCIUM: 10.4 mg/dL — AB (ref 8.9–10.3)
CHLORIDE: 103 mmol/L (ref 101–111)
CO2: 24 mmol/L (ref 22–32)
CREATININE: 0.82 mg/dL (ref 0.44–1.00)
GFR calc non Af Amer: >60 mL/min (ref >60)
GLUCOSE: 94 mg/dL (ref 65–99)
Potassium: 4 mmol/L (ref 3.5–5.1)
SODIUM: 142 mmol/L (ref 135–145)
Total Bilirubin: 0.4 mg/dL (ref 0.3–1.2)
Total Protein: 7.9 g/dL (ref 6.5–8.1)

## 2015-09-10 SURGERY — ANTERIOR CERVICAL DECOMPRESSION/DISCECTOMY FUSION 3 LEVELS
Anesthesia: General | Site: Spine Cervical

## 2015-09-10 MED ORDER — LIDOCAINE HCL (CARDIAC) 20 MG/ML IV SOLN
INTRAVENOUS | Status: DC | PRN
  Administered 2015-09-10: 100 mg via INTRAVENOUS

## 2015-09-10 MED ORDER — ROCURONIUM BROMIDE 50 MG/5ML IV SOLN
INTRAVENOUS | Status: AC
Start: 2015-09-10 — End: 2015-09-10
  Filled 2015-09-10: qty 1

## 2015-09-10 MED ORDER — HYDROMORPHONE HCL 1 MG/ML IJ SOLN
0.2500 mg | INTRAMUSCULAR | Status: DC | PRN
Start: 2015-09-10 — End: 2015-09-11
  Administered 2015-09-11 (×4): 0.5 mg via INTRAVENOUS

## 2015-09-10 MED ORDER — BUDESONIDE-FORMOTEROL FUMARATE 160-4.5 MCG/ACT IN AERO
2.0000 | INHALATION_SPRAY | Freq: Two times a day (BID) | RESPIRATORY_TRACT | Status: DC
  Administered 2015-09-11 – 2015-09-13 (×5): 2 via RESPIRATORY_TRACT
  Filled 2015-09-10 (×2): qty 6

## 2015-09-10 MED ORDER — DIPHENHYDRAMINE HCL 12.5 MG/5ML PO ELIX: 12.5000 mg | ORAL_SOLUTION | Freq: Four times a day (QID) | ORAL | Status: DC | PRN

## 2015-09-10 MED ORDER — LACTATED RINGERS IV SOLN
INTRAVENOUS | Status: DC
  Administered 2015-09-10: 15:00:00 via INTRAVENOUS

## 2015-09-10 MED ORDER — MORPHINE SULFATE 2 MG/ML IV SOLN: INTRAVENOUS | Status: DC

## 2015-09-10 MED ORDER — THROMBIN 5000 UNITS EX SOLR
OROMUCOSAL | Status: DC | PRN
  Administered 2015-09-10 (×2): via TOPICAL

## 2015-09-10 MED ORDER — OXYCODONE HCL 5 MG PO TABS
15.0000 mg | ORAL_TABLET | Freq: Once | ORAL | Status: DC
  Administered 2015-09-10: 15 mg via ORAL

## 2015-09-10 MED ORDER — DIPHENHYDRAMINE HCL 50 MG/ML IJ SOLN
12.5000 mg | Freq: Four times a day (QID) | INTRAMUSCULAR | Status: DC | PRN
  Administered 2015-09-11 (×2): 12.5 mg via INTRAVENOUS
  Filled 2015-09-10 (×3): qty 1

## 2015-09-10 MED ORDER — MEPERIDINE HCL 25 MG/ML IJ SOLN: 6.2500 mg | INTRAMUSCULAR | Status: DC | PRN

## 2015-09-10 MED ORDER — LIDOCAINE HCL (CARDIAC) 20 MG/ML IV SOLN
INTRAVENOUS | Status: AC
  Filled 2015-09-10: qty 10

## 2015-09-10 MED ORDER — 0.9 % SODIUM CHLORIDE (POUR BTL) OPTIME
TOPICAL | Status: DC | PRN
  Administered 2015-09-10: 1000 mL

## 2015-09-10 MED ORDER — MIDAZOLAM HCL 2 MG/2ML IJ SOLN
INTRAMUSCULAR | Status: AC
  Filled 2015-09-10: qty 2

## 2015-09-10 MED ORDER — PHENYLEPHRINE HCL 10 MG/ML IJ SOLN
10.0000 mg | INTRAVENOUS | Status: DC | PRN
  Administered 2015-09-10: 50 ug/min via INTRAVENOUS

## 2015-09-10 MED ORDER — ALBUTEROL SULFATE (2.5 MG/3ML) 0.083% IN NEBU
2.5000 mg | INHALATION_SOLUTION | RESPIRATORY_TRACT | Status: DC | PRN
  Administered 2015-09-11: 2.5 mg via RESPIRATORY_TRACT
  Filled 2015-09-10: qty 3

## 2015-09-10 MED ORDER — HYDROXYZINE HCL 25 MG PO TABS
25.0000 mg | ORAL_TABLET | Freq: Once | ORAL | Status: AC
Start: 2015-09-10 — End: 2015-09-10
  Administered 2015-09-10: 25 mg via ORAL
  Filled 2015-09-10: qty 1

## 2015-09-10 MED ORDER — EPHEDRINE SULFATE 50 MG/ML IJ SOLN
INTRAMUSCULAR | Status: DC | PRN
  Administered 2015-09-10 (×2): 20 mg via INTRAVENOUS
  Administered 2015-09-10: 10 mg via INTRAVENOUS

## 2015-09-10 MED ORDER — PREGABALIN 25 MG PO CAPS
100.0000 mg | ORAL_CAPSULE | Freq: Three times a day (TID) | ORAL | Status: DC
  Administered 2015-09-11 – 2015-09-13 (×8): 100 mg via ORAL
  Filled 2015-09-10 (×8): qty 4

## 2015-09-10 MED ORDER — THROMBIN 20000 UNITS EX SOLR
CUTANEOUS | Status: DC | PRN
  Administered 2015-09-10: 21:00:00 via TOPICAL

## 2015-09-10 MED ORDER — ONDANSETRON HCL 4 MG/2ML IJ SOLN
INTRAMUSCULAR | Status: AC
  Administered 2015-09-10: 4 mg
  Filled 2015-09-10: qty 2

## 2015-09-10 MED ORDER — PHENYLEPHRINE HCL 10 MG/ML IJ SOLN
INTRAMUSCULAR | Status: DC | PRN
  Administered 2015-09-10 (×3): 80 ug via INTRAVENOUS
  Administered 2015-09-10: 40 ug via INTRAVENOUS
  Administered 2015-09-10 (×2): 80 ug via INTRAVENOUS

## 2015-09-10 MED ORDER — FENTANYL CITRATE (PF) 250 MCG/5ML IJ SOLN
INTRAMUSCULAR | Status: AC
  Filled 2015-09-10: qty 5

## 2015-09-10 MED ORDER — NALOXEGOL OXALATE 25 MG PO TABS
25.0000 mg | ORAL_TABLET | Freq: Every day | ORAL | Status: DC
  Administered 2015-09-11 – 2015-09-13 (×3): 25 mg via ORAL
  Filled 2015-09-10 (×3): qty 1

## 2015-09-10 MED ORDER — ONDANSETRON HCL 4 MG/2ML IJ SOLN
INTRAMUSCULAR | Status: AC
Start: 2015-09-10 — End: 2015-09-10
  Filled 2015-09-10: qty 2

## 2015-09-10 MED ORDER — MENTHOL 3 MG MT LOZG
1.0000 | LOZENGE | OROMUCOSAL | Status: DC | PRN
  Filled 2015-09-10: qty 9

## 2015-09-10 MED ORDER — SODIUM CHLORIDE 0.9 % IJ SOLN: 9.0000 mL | INTRAMUSCULAR | Status: DC | PRN

## 2015-09-10 MED ORDER — AMPHETAMINE-DEXTROAMPHETAMINE 10 MG PO TABS
10.0000 mg | ORAL_TABLET | Freq: Two times a day (BID) | ORAL | Status: DC
  Administered 2015-09-12: 10 mg via ORAL
  Filled 2015-09-10 (×3): qty 1

## 2015-09-10 MED ORDER — OXYCODONE-ACETAMINOPHEN 5-325 MG PO TABS: 2.0000 | ORAL_TABLET | ORAL | Status: DC | PRN

## 2015-09-10 MED ORDER — FENTANYL CITRATE (PF) 250 MCG/5ML IJ SOLN
INTRAMUSCULAR | Status: AC
Start: 2015-09-10 — End: 2015-09-10
  Filled 2015-09-10: qty 5

## 2015-09-10 MED ORDER — HYDROMORPHONE HCL 1 MG/ML IJ SOLN
INTRAMUSCULAR | Status: AC
  Filled 2015-09-10: qty 1

## 2015-09-10 MED ORDER — NALOXONE HCL 0.4 MG/ML IJ SOLN: 0.4000 mg | INTRAMUSCULAR | Status: DC | PRN

## 2015-09-10 MED ORDER — PHENOL 1.4 % MT LIQD: 1.0000 | OROMUCOSAL | Status: DC | PRN

## 2015-09-10 MED ORDER — ONDANSETRON HCL 4 MG/2ML IJ SOLN
4.0000 mg | Freq: Once | INTRAMUSCULAR | Status: AC | PRN
  Administered 2015-09-10: 4 mg via INTRAVENOUS

## 2015-09-10 MED ORDER — CEFAZOLIN SODIUM-DEXTROSE 2-3 GM-% IV SOLR
INTRAVENOUS | Status: AC
  Filled 2015-09-10: qty 50

## 2015-09-10 MED ORDER — LACTATED RINGERS IV SOLN
INTRAVENOUS | Status: DC | PRN
  Administered 2015-09-10 (×2): via INTRAVENOUS

## 2015-09-10 MED ORDER — FENTANYL CITRATE (PF) 100 MCG/2ML IJ SOLN
INTRAMUSCULAR | Status: DC | PRN
  Administered 2015-09-10: 50 ug via INTRAVENOUS
  Administered 2015-09-10 (×4): 100 ug via INTRAVENOUS
  Administered 2015-09-10: 150 ug via INTRAVENOUS
  Administered 2015-09-10: 50 ug via INTRAVENOUS

## 2015-09-10 MED ORDER — DEXAMETHASONE SODIUM PHOSPHATE 4 MG/ML IJ SOLN
4.0000 mg | Freq: Four times a day (QID) | INTRAMUSCULAR | Status: DC
  Administered 2015-09-11 – 2015-09-13 (×10): 4 mg via INTRAVENOUS
  Filled 2015-09-10 (×10): qty 1

## 2015-09-10 MED ORDER — SUGAMMADEX SODIUM 200 MG/2ML IV SOLN
INTRAVENOUS | Status: DC | PRN
  Administered 2015-09-10: 150 mg via INTRAVENOUS

## 2015-09-10 MED ORDER — GLYCOPYRROLATE 0.2 MG/ML IJ SOLN
INTRAMUSCULAR | Status: DC | PRN
  Administered 2015-09-10: 0.2 mg via INTRAVENOUS

## 2015-09-10 MED ORDER — GLYCOPYRROLATE 0.2 MG/ML IJ SOLN
INTRAMUSCULAR | Status: AC
  Filled 2015-09-10: qty 2

## 2015-09-10 MED ORDER — SODIUM CHLORIDE 0.9 % IV SOLN
INTRAVENOUS | Status: DC
  Administered 2015-09-11: 02:00:00 via INTRAVENOUS

## 2015-09-10 MED ORDER — IPRATROPIUM-ALBUTEROL 0.5-2.5 (3) MG/3ML IN SOLN: 3.0000 mL | Freq: Four times a day (QID) | RESPIRATORY_TRACT | Status: DC | PRN

## 2015-09-10 MED ORDER — PHENYLEPHRINE 40 MCG/ML (10ML) SYRINGE FOR IV PUSH (FOR BLOOD PRESSURE SUPPORT)
PREFILLED_SYRINGE | INTRAVENOUS | Status: AC
  Filled 2015-09-10: qty 10

## 2015-09-10 MED ORDER — ROCURONIUM BROMIDE 100 MG/10ML IV SOLN
INTRAVENOUS | Status: DC | PRN
  Administered 2015-09-10: 50 mg via INTRAVENOUS
  Administered 2015-09-10: 20 mg via INTRAVENOUS

## 2015-09-10 MED ORDER — ONDANSETRON HCL 4 MG/2ML IJ SOLN: 4.0000 mg | Freq: Four times a day (QID) | INTRAMUSCULAR | Status: DC | PRN

## 2015-09-10 MED ORDER — OXYCODONE HCL 5 MG PO TABS
ORAL_TABLET | ORAL | Status: AC
  Filled 2015-09-10: qty 3

## 2015-09-10 MED ORDER — ARTIFICIAL TEARS OP OINT
TOPICAL_OINTMENT | OPHTHALMIC | Status: AC
  Filled 2015-09-10: qty 3.5

## 2015-09-10 MED ORDER — SUGAMMADEX SODIUM 200 MG/2ML IV SOLN
INTRAVENOUS | Status: AC
Start: 2015-09-10 — End: 2015-09-10
  Filled 2015-09-10: qty 2

## 2015-09-10 MED ORDER — MIDAZOLAM HCL 5 MG/5ML IJ SOLN
INTRAMUSCULAR | Status: DC | PRN
  Administered 2015-09-10 (×2): 2 mg via INTRAVENOUS

## 2015-09-10 MED ORDER — PROPOFOL 10 MG/ML IV BOLUS
INTRAVENOUS | Status: DC | PRN
  Administered 2015-09-10: 140 mg via INTRAVENOUS
  Administered 2015-09-10: 60 mg via INTRAVENOUS

## 2015-09-10 MED ORDER — ROCURONIUM BROMIDE 50 MG/5ML IV SOLN
INTRAVENOUS | Status: AC
  Filled 2015-09-10: qty 1

## 2015-09-10 MED ORDER — DIAZEPAM 5 MG PO TABS
5.0000 mg | ORAL_TABLET | Freq: Four times a day (QID) | ORAL | Status: DC | PRN
  Administered 2015-09-11 – 2015-09-13 (×4): 5 mg via ORAL
  Filled 2015-09-10 (×5): qty 1

## 2015-09-10 SURGICAL SUPPLY — 71 items
APL SKNCLS STERI-STRIP NONHPOA (GAUZE/BANDAGES/DRESSINGS) ×1
BENZOIN TINCTURE PRP APPL 2/3 (GAUZE/BANDAGES/DRESSINGS) ×3 IMPLANT
BIT DRILL SM SPINE QC 12 (BIT) ×2 IMPLANT
BLADE ULTRA TIP 2M (BLADE) ×3 IMPLANT
BNDG GAUZE ELAST 4 BULKY (GAUZE/BANDAGES/DRESSINGS) ×6 IMPLANT
BUR BARREL STRAIGHT FLUTE 4.0 (BURR) IMPLANT
BUR MATCHSTICK NEURO 3.0 LAGG (BURR) ×3 IMPLANT
CANISTER SUCT 3000ML PPV (MISCELLANEOUS) ×3 IMPLANT
CLOSURE WOUND 1/2 X4 (GAUZE/BANDAGES/DRESSINGS) ×1
COVER MAYO STAND STRL (DRAPES) ×3 IMPLANT
DRAPE C-ARM 42X72 X-RAY (DRAPES) ×6 IMPLANT
DRAPE LAPAROTOMY 100X72 PEDS (DRAPES) ×3 IMPLANT
DRAPE MICROSCOPE LEICA (MISCELLANEOUS) ×3 IMPLANT
DRAPE POUCH INSTRU U-SHP 10X18 (DRAPES) ×3 IMPLANT
DRAPE PROXIMA HALF (DRAPES) IMPLANT
DRSG OPSITE POSTOP 4X6 (GAUZE/BANDAGES/DRESSINGS) ×2 IMPLANT
DURAPREP 6ML APPLICATOR 50/CS (WOUND CARE) ×3 IMPLANT
ELECT REM PT RETURN 9FT ADLT (ELECTROSURGICAL) ×3
ELECTRODE REM PT RTRN 9FT ADLT (ELECTROSURGICAL) ×1 IMPLANT
GAUZE SPONGE 4X4 12PLY STRL (GAUZE/BANDAGES/DRESSINGS) ×3 IMPLANT
GAUZE SPONGE 4X4 16PLY XRAY LF (GAUZE/BANDAGES/DRESSINGS) IMPLANT
GLOVE BIO SURGEON STRL SZ 6.5 (GLOVE) ×6 IMPLANT
GLOVE BIO SURGEON STRL SZ7 (GLOVE) IMPLANT
GLOVE BIO SURGEON STRL SZ7.5 (GLOVE) IMPLANT
GLOVE BIO SURGEON STRL SZ8 (GLOVE) IMPLANT
GLOVE BIO SURGEON STRL SZ8.5 (GLOVE) IMPLANT
GLOVE BIO SURGEONS STRL SZ 6.5 (GLOVE) ×3
GLOVE BIOGEL M 8.0 STRL (GLOVE) ×3 IMPLANT
GLOVE ECLIPSE 6.5 STRL STRAW (GLOVE) IMPLANT
GLOVE ECLIPSE 7.0 STRL STRAW (GLOVE) IMPLANT
GLOVE ECLIPSE 7.5 STRL STRAW (GLOVE) IMPLANT
GLOVE ECLIPSE 8.0 STRL XLNG CF (GLOVE) IMPLANT
GLOVE ECLIPSE 8.5 STRL (GLOVE) IMPLANT
GLOVE EXAM NITRILE LRG STRL (GLOVE) IMPLANT
GLOVE EXAM NITRILE MD LF STRL (GLOVE) IMPLANT
GLOVE EXAM NITRILE XL STR (GLOVE) IMPLANT
GLOVE EXAM NITRILE XS STR PU (GLOVE) IMPLANT
GLOVE INDICATOR 6.5 STRL GRN (GLOVE) IMPLANT
GLOVE INDICATOR 7.0 STRL GRN (GLOVE) ×6 IMPLANT
GLOVE INDICATOR 7.5 STRL GRN (GLOVE) IMPLANT
GLOVE INDICATOR 8.0 STRL GRN (GLOVE) IMPLANT
GLOVE INDICATOR 8.5 STRL (GLOVE) IMPLANT
GLOVE OPTIFIT SS 8.0 STRL (GLOVE) IMPLANT
GLOVE SURG SS PI 6.5 STRL IVOR (GLOVE) IMPLANT
GOWN STRL REUS W/ TWL LRG LVL3 (GOWN DISPOSABLE) ×1 IMPLANT
GOWN STRL REUS W/ TWL XL LVL3 (GOWN DISPOSABLE) ×1 IMPLANT
GOWN STRL REUS W/TWL 2XL LVL3 (GOWN DISPOSABLE) IMPLANT
GOWN STRL REUS W/TWL LRG LVL3 (GOWN DISPOSABLE) ×9
GOWN STRL REUS W/TWL XL LVL3 (GOWN DISPOSABLE) ×3
HALTER HD/CHIN CERV TRACTION D (MISCELLANEOUS) ×3 IMPLANT
HEMOSTAT POWDER KIT SURGIFOAM (HEMOSTASIS) IMPLANT
KIT BASIN OR (CUSTOM PROCEDURE TRAY) ×3 IMPLANT
KIT ROOM TURNOVER OR (KITS) ×3 IMPLANT
NDL SPNL 22GX3.5 QUINCKE BK (NEEDLE) ×1 IMPLANT
NEEDLE SPNL 22GX3.5 QUINCKE BK (NEEDLE) ×3 IMPLANT
NS IRRIG 1000ML POUR BTL (IV SOLUTION) ×3 IMPLANT
PACK LAMINECTOMY NEURO (CUSTOM PROCEDURE TRAY) ×3 IMPLANT
PATTIES SURGICAL .5 X1 (DISPOSABLE) ×3 IMPLANT
PLATE ANT CERV 3LVL 48MM (Plate) ×2 IMPLANT
RUBBERBAND STERILE (MISCELLANEOUS) ×6 IMPLANT
SCREW SELF TAP VAR 4.2X12PRO (Screw) ×12 IMPLANT
SCREW SELF-TAP 4.6X12MM ANGLE (Screw) ×3 IMPLANT
SPACER CERVICAL FRGE 12X14X7-7 (Spacer) ×4 IMPLANT
SPACER CERVICAL FRGE 12X14X8-7 (Spacer) ×3 IMPLANT
SPONGE INTESTINAL PEANUT (DISPOSABLE) ×6 IMPLANT
SPONGE SURGIFOAM ABS GEL 100 (HEMOSTASIS) ×5 IMPLANT
STRIP CLOSURE SKIN 1/2X4 (GAUZE/BANDAGES/DRESSINGS) ×2 IMPLANT
SUT VIC AB 3-0 SH 8-18 (SUTURE) ×3 IMPLANT
TOWEL OR 17X24 6PK STRL BLUE (TOWEL DISPOSABLE) ×3 IMPLANT
TOWEL OR 17X26 10 PK STRL BLUE (TOWEL DISPOSABLE) ×3 IMPLANT
WATER STERILE IRR 1000ML POUR (IV SOLUTION) ×3 IMPLANT

## 2015-09-10 NOTE — Anesthesia Preprocedure Evaluation (Signed)
Anesthesia Evaluation  Patient identified by MRN, date of birth, ID band Patient awake    Reviewed: Allergy & Precautions, NPO status , Patient's Chart, lab work & pertinent test results  Airway Mallampati: I  TM Distance: >3 FB Neck ROM: Full    Dental   Pulmonary COPD, former smoker,    Pulmonary exam normal        Cardiovascular Normal cardiovascular exam     Neuro/Psych    GI/Hepatic (+) Hepatitis -, C  Endo/Other    Renal/GU Renal InsufficiencyRenal disease     Musculoskeletal   Abdominal   Peds  Hematology   Anesthesia Other Findings   Reproductive/Obstetrics                             Anesthesia Physical Anesthesia Plan  ASA: III and emergent  Anesthesia Plan: General   Post-op Pain Management:    Induction: Intravenous  Airway Management Planned: Oral ETT  Additional Equipment:   Intra-op Plan:   Post-operative Plan: Extubation in OR  Informed Consent: I have reviewed the patients History and Physical, chart, labs and discussed the procedure including the risks, benefits and alternatives for the proposed anesthesia with the patient or authorized representative who has indicated his/her understanding and acceptance.     Plan Discussed with: CRNA and Surgeon  Anesthesia Plan Comments:         Anesthesia Quick Evaluation

## 2015-09-10 NOTE — Progress Notes (Signed)
Pt. Reports that she has itching on her face from the Oxy IR, no rash. Pt. Asking for update on surgery schedule & hydroxyzine.

## 2015-09-10 NOTE — Anesthesia Procedure Notes (Signed)
Procedure Name: Intubation Date/Time: 09/10/2015 7:39 PM Performed by: Manus Gunning, Everlena Mackley J Pre-anesthesia Checklist: Patient identified, Emergency Drugs available, Timeout performed, Suction available and Patient being monitored Patient Re-evaluated:Patient Re-evaluated prior to inductionOxygen Delivery Method: Circle system utilized Preoxygenation: Pre-oxygenation with 100% oxygen Intubation Type: IV induction Ventilation: Mask ventilation without difficulty Laryngoscope Size: Mac and 3 Grade View: Grade I Tube type: Oral Tube size: 7.5 mm Number of attempts: 1 Placement Confirmation: ETT inserted through vocal cords under direct vision,  breath sounds checked- equal and bilateral and positive ETCO2 Secured at: 21 cm Tube secured with: Tape Dental Injury: Teeth and Oropharynx as per pre-operative assessment

## 2015-09-10 NOTE — Progress Notes (Signed)
Spoke with Dr. Royce Macadamia, related pt. Complaint of itching, pt. Believes this is from the OXYIR, pt. Reports this is a frequent complaint with OXY IR.  Order fiurnished. Pt. & spouse informed of surgery delay time.

## 2015-09-10 NOTE — Progress Notes (Signed)
Call to MD- Dr. Royce Macadamia- /w pt. C/o of pain, order furnished.
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
## 2015-09-10 NOTE — Transfer of Care (Signed)
Immediate Anesthesia Transfer of Care Note  Patient: Jamie Schultz  Procedure(s) Performed: Procedure(s) with comments: Cervical Four-five, Cervical Five-Six, Cervical Six-Seven Anterior cervical decompression/diskectomy/fusion (N/A) - C4-5 C5-6 C6-7 Anterior cervical decompression/diskectomy/fusion  Patient Location: PACU  Anesthesia Type:General  Level of Consciousness: awake and alert   Airway & Oxygen Therapy: Patient Spontanous Breathing and Patient connected to face mask oxygen  Post-op Assessment: Report given to RN and Post -op Vital signs reviewed and stable  Post vital signs: Reviewed and stable  Last Vitals:  Filed Vitals:   09/10/15 1445 09/10/15 1505  BP: 143/99   Pulse: 81   Temp:  36.9 C  Resp: 22     Complications: No apparent anesthesia complications

## 2015-09-11 ENCOUNTER — Encounter (HOSPITAL_COMMUNITY): Payer: Self-pay | Admitting: Neurosurgery

## 2015-09-11 MED ORDER — ALPRAZOLAM 0.5 MG PO TABS
0.5000 mg | ORAL_TABLET | Freq: Three times a day (TID) | ORAL | Status: DC | PRN
  Administered 2015-09-11 – 2015-09-13 (×2): 0.5 mg via ORAL
  Filled 2015-09-11 (×3): qty 1

## 2015-09-11 MED ORDER — CETYLPYRIDINIUM CHLORIDE 0.05 % MT LIQD
7.0000 mL | Freq: Two times a day (BID) | OROMUCOSAL | Status: DC
  Administered 2015-09-11 – 2015-09-13 (×3): 7 mL via OROMUCOSAL

## 2015-09-11 MED ORDER — SODIUM CHLORIDE 0.9 % IV SOLN: 250.0000 mL | INTRAVENOUS | Status: DC

## 2015-09-11 MED ORDER — MORPHINE SULFATE (PF) 2 MG/ML IV SOLN
2.0000 mg | INTRAVENOUS | Status: DC | PRN
  Administered 2015-09-11 (×2): 2 mg via INTRAVENOUS
  Administered 2015-09-11 (×2): 4 mg via INTRAVENOUS
  Administered 2015-09-11: 2 mg via INTRAVENOUS
  Administered 2015-09-11 (×2): 4 mg via INTRAVENOUS
  Administered 2015-09-12 (×2): 2 mg via INTRAVENOUS
  Filled 2015-09-11: qty 2
  Filled 2015-09-11 (×3): qty 1
  Filled 2015-09-11: qty 2
  Filled 2015-09-11: qty 1
  Filled 2015-09-11: qty 2
  Filled 2015-09-11 (×3): qty 1
  Filled 2015-09-11: qty 2

## 2015-09-11 MED ORDER — SODIUM CHLORIDE 0.9 % IJ SOLN
3.0000 mL | Freq: Two times a day (BID) | INTRAMUSCULAR | Status: DC
  Administered 2015-09-12 (×2): 3 mL via INTRAVENOUS

## 2015-09-11 MED ORDER — ACETAMINOPHEN 325 MG PO TABS: 650.0000 mg | ORAL_TABLET | ORAL | Status: DC | PRN

## 2015-09-11 MED ORDER — ACETAMINOPHEN 650 MG RE SUPP: 650.0000 mg | RECTAL | Status: DC | PRN

## 2015-09-11 MED ORDER — ONDANSETRON HCL 4 MG/2ML IJ SOLN
4.0000 mg | INTRAMUSCULAR | Status: DC | PRN
  Administered 2015-09-11 (×2): 4 mg via INTRAVENOUS
  Filled 2015-09-11 (×2): qty 2

## 2015-09-11 MED ORDER — ONDANSETRON 4 MG PO TBDP
4.0000 mg | ORAL_TABLET | ORAL | Status: DC | PRN
  Administered 2015-09-11 – 2015-09-12 (×4): 4 mg via ORAL
  Filled 2015-09-11 (×4): qty 1

## 2015-09-11 MED ORDER — OXYCODONE HCL 5 MG PO TABS
20.0000 mg | ORAL_TABLET | Freq: Three times a day (TID) | ORAL | Status: DC | PRN
  Administered 2015-09-11 – 2015-09-12 (×5): 20 mg via ORAL
  Filled 2015-09-11 (×5): qty 4

## 2015-09-11 MED ORDER — VANCOMYCIN HCL IN DEXTROSE 1-5 GM/200ML-% IV SOLN
1000.0000 mg | Freq: Two times a day (BID) | INTRAVENOUS | Status: AC
  Administered 2015-09-11 (×2): 1000 mg via INTRAVENOUS
  Filled 2015-09-11 (×2): qty 200

## 2015-09-11 MED ORDER — SODIUM CHLORIDE 0.9 % IJ SOLN: 3.0000 mL | INTRAMUSCULAR | Status: DC | PRN

## 2015-09-11 MED ORDER — HYDROMORPHONE HCL 1 MG/ML IJ SOLN
INTRAMUSCULAR | Status: AC
  Filled 2015-09-11: qty 1

## 2015-09-11 NOTE — Progress Notes (Signed)
Patient complaint of severe burning sensation on her Rt ring finger and pinkie. Dr Kathyrn Sheriff notified about the new onset of burning sensation after surgery.  Pain medication reordered and to continue monitor patient.

## 2015-09-11 NOTE — Progress Notes (Signed)
Patient ID: Jamie Schultz, female   DOB: 1961/02/17, 55 y.o.   MRN: [id number] C/o symptoms of right ulnar neuropathy. No weakness. Drain working well. Voice normal. Seen by pt

## 2015-09-11 NOTE — Op Note (Signed)
NAME:  Jamie Schultz, Jamie Schultz NO.:  [id number]  MEDICAL RECORD NO.:  [id number]  LOCATION:  [facility]                         FACILITY:  [facility]  PHYSICIAN:  Leeroy Cha, M.D.   DATE OF BIRTH:  [birth year]  DATE OF PROCEDURE:  09/10/2015 DATE OF DISCHARGE:                              OPERATIVE REPORT   PREOPERATIVE DIAGNOSES:  Cervical stenosis, 4-5, 5-6, and 6-7.  Chronic radiculopathy.  Status post posterior fusion, 2-3.  POSTOPERATIVE DIAGNOSES:  Cervical stenosis, 4-5, 5-6, and 6-7.  Chronic radiculopathy. Status post posterior fusion, 2-3.  PROCEDURE:  Anterior 4-5, 5-6, 6-7 diskectomy.  Decompression of the spinal cord.  Removal of calcified posterior ligament.  Foraminotomy. Interbody fusion with allograft plate from QA348G.  Microscope.  SURGEON:  Leeroy Cha, M.D.  ASSISTANT:  Dr. Kathyrn Sheriff.  CLINICAL HISTORY:  The patient was seen in my office because of back pain worsened to both legs.  I found that she has spondylolisthesis at the lumbar 4-5, but she had weakness of both arms associated with problem with balance.  X-rays showed severe stenosis worse at the level 5-6.  The patient has calcification of the posterior ligament.  The stenosis was coming from C4 all the way down to C6-7.  In the past, she had posterior fusion at the level of 2-3.  Surgery was advised.  She knew the risk and benefit.  DESCRIPTION OF PROCEDURE:  The patient was taken to the OR, and after intubation, the right side of the neck was cleaned with ChloraPrep. Drapes were applied.  Longitudinal incision was made through the skin and subcutaneous tissue.  Retraction was made.  The patient has a tendency to bleed quite easily.  Hemostasis was done with bipolar.  X- rays showed that we were right at the level 4-5.  We brought the microscope into the area.  The anterior ligament which was calcified was removed, and we entered the disk space at the level 4-5 with diskectomy, with  decompression, removal of the posterior ligament was achieved.  The same thing was done at the level 5-6 where she has quite a bit of stenosis with flattening of the spinal cord.  She has calcification of the posterior ligament mostly going into the body of C6.  Decompression was achieved.  The same procedure was done at the level of 6-7 with decompression and foraminotomy.  Then 3 allografts, lordotic were inserted, followed by a plate using 7 or 8 screws.  Although she had calcification of the ligament, nevertheless her bones were quite osteoporotic. Lateral cervical spine showed good position of the plate upper level of 4-5.  We were unable to see below.  The area was irrigated.  Hemostasis was accomplished.  A drain was left in the operative site, and the wound was closed with Vicryl and Steri-Strips.  The patient is going to go back to PACU.  We are going to be using Aspen brace.          ______________________________ Leeroy Cha, M.D.     EB/MEDQ  D:  09/10/2015  T:  09/11/2015  Job:  [id number]

## 2015-09-11 NOTE — Anesthesia Postprocedure Evaluation (Signed)
Anesthesia Post Note  Patient: Jamie Schultz  Procedure(s) Performed: Procedure(s) (LRB): Cervical Four-five, Cervical Five-Six, Cervical Six-Seven Anterior cervical decompression/diskectomy/fusion (N/A)  Patient location during evaluation: PACU Anesthesia Type: General Level of consciousness: awake and alert Pain management: pain level controlled Vital Signs Assessment: post-procedure vital signs reviewed and stable Respiratory status: spontaneous breathing, nonlabored ventilation, respiratory function stable and patient connected to nasal cannula oxygen Cardiovascular status: blood pressure returned to baseline and stable Postop Assessment: no signs of nausea or vomiting Anesthetic complications: no    Last Vitals:  Filed Vitals:   09/11/15 0010 09/11/15 0026  BP:  136/102  Pulse: 79 81  Temp:    Resp: 20 23    Last Pain:  Filed Vitals:   09/11/15 0032  PainSc: 8     LLE Motor Response: Purposeful movement;Responds to commands (09/11/15 0026) LLE Sensation: Full sensation;No numbness;No tingling (09/11/15 0026) RLE Motor Response: Purposeful movement;Responds to commands (09/11/15 0026) RLE Sensation: Tingling;No numbness (09/11/15 0026)      Rosabel Sermeno DAVID

## 2015-09-11 NOTE — Progress Notes (Signed)
Patient was given zofran IV per [facility], patient states zofran has not relieved nausea, no vomiting. MD paged. Patient also states she cannot wear aspen collar due to nausea and that the collar is also uncomfortable. RN educated patient, patient continue to refuse collar. RN ordered PCA pump set, per facilities, no PCA pumps available. Night RN notified Dr. Kathyrn Sheriff, prn morphine ordered last pm. Will continue to monitor patient.

## 2015-09-11 NOTE — Care Management Note (Signed)
Case Management Note  Patient Details  Name: Jamie Schultz MRN: [id number] Date of Birth: 01-15-1961  Subjective/Objective:   Patient admitted for cervical stenosis and underwent a C4-7 ACDF. Patient is from home with her husband.                  Action/Plan: Awaiting PT/OT recommendations. CM will continue to follow for discharge needs.   Expected Discharge Date:  09/17/15               Expected Discharge Plan:     In-House Referral:     Discharge planning Services     Post Acute Care Choice:    Choice offered to:     DME Arranged:    DME Agency:     HH Arranged:    HH Agency:     Status of Service:  In process, will continue to follow  Medicare Important Message Given:    Date Medicare IM Given:    Medicare IM give by:    Date Additional Medicare IM Given:    Additional Medicare Important Message give by:     If discussed at [place] of Stay Meetings, dates discussed:    Additional Comments:  Pollie Friar, RN 09/11/2015, 10:28 AM
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
## 2015-09-12 MED ORDER — OXYCODONE HCL 5 MG PO TABS
20.0000 mg | ORAL_TABLET | Freq: Four times a day (QID) | ORAL | Status: DC | PRN
  Administered 2015-09-12 – 2015-09-13 (×4): 20 mg via ORAL
  Filled 2015-09-12 (×4): qty 4

## 2015-09-12 MED ORDER — MORPHINE SULFATE (PF) 2 MG/ML IV SOLN
2.0000 mg | Freq: Once | INTRAVENOUS | Status: AC
Start: 2015-09-12 — End: 2015-09-12
  Administered 2015-09-12: 2 mg via INTRAVENOUS
  Filled 2015-09-12: qty 1

## 2015-09-12 NOTE — Progress Notes (Signed)
Patient ID: Jamie Schultz, female   DOB: 1961/03/08, 55 y.o.   MRN: [id number] Better, drain out. Wheezing. No weakness. See orders
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
## 2015-09-13 NOTE — Discharge Summary (Signed)
Physician Discharge Summary  Patient ID: Jamie Schultz MRN: [id number] DOB/AGE: [id number] y.o.  Admit date: 09/10/2015 Discharge date: 09/13/2015  Admission Diagnoses:cervical stenosis  Discharge Diagnoses:  Active Problems:   Cervical stenosis of spinal canal   Discharged Condition: no pain  Hospital Course: surgery  Consults: none  Significant Diagnostic Studies:mri  Treatments: cervical fusion c4 to c7  Discharge Exam: Blood pressure 119/94, pulse 77, temperature 98.2 F (36.8 C), temperature source Oral, resp. rate 16, height 5' (1.524 m), weight 71.714 kg (158 lb 1.6 oz), SpO2 97 %. No weakness, no pain  Disposition: home     Medication List    ASK your doctor about these medications        albuterol (2.5 MG/3ML) 0.083% nebulizer solution  Commonly known as:  PROVENTIL  Take 2.5 mg by nebulization every 4 (four) hours as needed. For shortness of breath     alprazolam 2 MG tablet  Commonly known as:  XANAX  Take 2 mg by mouth 3 (three) times daily.     amphetamine-dextroamphetamine 10 MG tablet  Commonly known as:  ADDERALL  Take 10 mg by mouth 2 (two) times daily.     BELSOMRA 20 MG Tabs  Generic drug:  Suvorexant  TAKE 1 TABLET BY MOUTH AT BEDTIME AS DIRECTED     carisoprodol 350 MG tablet  Commonly known as:  SOMA  Take 1 tablet (350 mg total) by mouth 3 (three) times daily as needed. For pain     EPIPEN 2-PAK 0.3 mg/0.3 mL Soaj injection  Generic drug:  EPINEPHrine  USE AS DIRECTED FOR ANAPYLACTIC REACTION (TO BEE STINGS)     Fluticasone Furoate-Vilanterol 100-25 MCG/INH Aepb  Commonly known as:  BREO ELLIPTA  Inhale 1 puff into the lungs daily.     hydrOXYzine 25 MG tablet  Commonly known as:  ATARAX/VISTARIL  Take 1 tablet (25 mg total) by mouth every 8 (eight) hours as needed.     Ipratropium-Albuterol 20-100 MCG/ACT Aers respimat  Commonly known as:  COMBIVENT RESPIMAT  Inhale 1-2 puffs into the lungs every 6 (six) hours as needed  for wheezing or shortness of breath.     LYRICA 100 MG capsule  Generic drug:  pregabalin  Take 1 capsule (100 mg total) by mouth 3 (three) times daily.     LYSINE PO  Take 1 tablet by mouth daily as needed (for cold sores).     multivitamin with minerals Tabs tablet  Take 1 tablet by mouth daily.     naloxegol oxalate 12.5 MG Tabs tablet  Commonly known as:  MOVANTIK  Take 2 tablets (25 mg total) by mouth daily.     NASONEX 50 MCG/ACT nasal spray  Generic drug:  mometasone  PLACE 2 SPRAYS EACH NOSTRIL EVERY DAY     ondansetron 8 MG disintegrating tablet  Commonly known as:  ZOFRAN-ODT  Take 1 tablet (8 mg total) by mouth every 8 (eight) hours as needed for nausea or vomiting.     oxyCODONE 15 MG immediate release tablet  Commonly known as:  ROXICODONE  Take 15 mg by mouth every 8 (eight) hours as needed. for pain     PREMARIN vaginal cream  Generic drug:  conjugated estrogens  Apply 1 gram vaginally at bedtime 2 times a week     promethazine 25 MG tablet  Commonly known as:  PHENERGAN  Take 1 tablet (25 mg total) by mouth every 8 (eight) hours as needed for nausea or vomiting.  QUEtiapine 400 MG tablet  Commonly known as:  SEROQUEL  TAKE 1 & 1/2 TABLETS BY MOUTH AT BEDTIME     tobramycin 0.3 % ophthalmic solution  Commonly known as:  TOBREX  Place 2 drops into both eyes every 4 (four) hours.     VITAMIN B 12 PO  Take 1 tablet by mouth daily.     vitamin E 400 UNIT capsule  Generic drug:  vitamin E  Take 400 Units by mouth daily.         Signed: Floyce Stakes 09/13/2015, 10:10 AM

## 2015-09-13 NOTE — Progress Notes (Signed)
Patient discharged home with husband. Patient, while alone in room, stated she felt safe at home, did not want to speak with Education officer, museum. RN discussed discharge instructions including pain management, follow up appointment, constipation prevention, care of incision site. Honey comb dressing had minimal drainage, patient's husband requested dressing to be changed due to size of honey comb dressing. Dressing was changed, smaller size applied, site still covered by honeycomb, steristrips were intact, no redness or tenderness noted at incision site. Stated pain was "7/10," states this is manageable for her. Patient educated regarding aspen cervical collar, she does not wear this as per MD order, educated again, still refuses to wear. Educated about signs and symptoms of infection, when to contact MD. Patient was given discharge instructions, vocalized understanding of all, per patient, Dr. Joya Salm gave her prescriptions. Neuro assessment intact, unchanged. Patient escorted to car by guest services, to be driven home by husband.

## 2015-09-13 NOTE — Discharge Instructions (Signed)
Anterior Cervical Diskectomy and Fusion Anterior cervical diskectomy and fusion is a surgery that is done on the neck (cervical spine) to take pressure off of the nerves or the spinal cord. It is performed through the front (anterior) part of the neck. During this surgery, the damaged disk that is causing pain, numbness, or weakness is removed. The area where the disk was removed is filled with a plastic spacer implant, a bone graft, or both. These implants and bone grafts take pressure off of the nerves and spinal cord by making more room for the nerves to leave the spine. LET [facility] CARE PROVIDER KNOW ABOUT:  Any allergies you have.  All medicines you are taking, including vitamins, herbs, eye drops, creams, and over-the-counter medicines.  Previous problems you or members of your family have had with the use of anesthetics.  Any blood disorders you have.  Previous surgeries you have had.  Any medical conditions you may have. RISKS AND COMPLICATIONS Generally, this is a safe procedure. However, problems may occur, including:  Infection.  Bleeding with the possible need for blood transfusion.  Injury to surrounding structures, including nerves.  Leakage of fluid from the brain or spinal cord (cerebrospinal fluid).  Blood clots.  Temporary breathing difficulties after surgery. BEFORE THE PROCEDURE  Follow your health care provider's instructions about eating or drinking restrictions.  Ask your health care provider about:  Changing or stopping your regular medicines. This is especially important if you are taking diabetes medicines or blood thinners.  Taking medicines such as aspirin and ibuprofen. These medicines can thin your blood. Do not take these medicines before your procedure if your health care provider instructs you not to.  You may be given antibiotic medicines to help prevent infection.  Your incision site may be marked on your neck. PROCEDURE  An IV tube  will be inserted into one of your veins.  You will be given one or more of the following:  A medicine that helps you relax (sedative).  A medicine that makes you fall asleep (general anesthetic).  A breathing tube will be placed.  Your neck will be cleaned with a germ-killing solution (antiseptic).  Your surgeon will make an incision on the front of your neck, usually within a skinfold line.  Your neck muscles will be spread apart, and the damaged disk and bone spurs will be removed.  The area where the disk was removed will be filled with a small plastic spacer implant, a bone graft, or both.  Your surgeon may put metal plates and screws (hardware) in your neck. This helps to stabilize the surgical site and keep implants and bone grafts in place. The hardware reduces motion at the surgical site so your bones can grow together (fuse). This provides extra support to your neck.  The incision will be closed with stitches (sutures).  A bandage (dressing) will be applied to cover the incision. The procedure may vary among health care providers and hospitals. AFTER THE PROCEDURE  Your blood pressure, heart rate, breathing rate, and blood oxygen level will be monitored often until the medicines you were given have worn off.  You will be monitored for any signs of complications from the procedure, such as:  Too much bleeding from the incision site.  A buildup of blood under your skin at the surgical site.  Difficulty breathing.  You may continue to receive antibiotics.  You can start to eat as soon as you feel comfortable.  You may be given  a neck brace to wear after surgery. This brace limits your neck movement while your bones are fusing together. Follow your health care provider's instructions about how often and how long you need to wear this.   This information is not intended to replace advice given to you by your health care provider. Make sure you discuss any questions you  have with your health care provider.   Document Released: 08/05/2009 Document Revised: 09/07/2014 Document Reviewed: 08/05/2009 Elsevier Interactive Patient Education 2016 [employer].  Constipation, Adult Constipation is when a person:  Poops (has a bowel movement) less than 3 times a week.  Has a hard time pooping.  Has poop that is dry, hard, or bigger than normal. HOME CARE   Eat foods with a lot of fiber in them. This includes fruits, vegetables, beans, and whole grains such as brown rice.  Avoid fatty foods and foods with a lot of sugar. This includes french fries, hamburgers, cookies, candy, and soda.  If you are not getting enough fiber from food, take products with added fiber in them (supplements).  Drink enough fluid to keep your pee (urine) clear or pale yellow.  Exercise on a regular basis, or as told by your doctor.  Go to the restroom when you feel like you need to poop. Do not hold it.  Only take medicine as told by your doctor. Do not take medicines that help you poop (laxatives) without talking to your doctor first. GET HELP RIGHT AWAY IF:   You have bright red blood in your poop (stool).  Your constipation lasts more than 4 days or gets worse.  You have belly (abdominal) or butt (rectal) pain.  You have thin poop (as thin as a pencil).  You lose weight, and it cannot be explained. MAKE SURE YOU:   Understand these instructions.  Will watch your condition.  Will get help right away if you are not doing well or get worse.   This information is not intended to replace advice given to you by your health care provider. Make sure you discuss any questions you have with your health care provider.   Document Released: 02/03/2008 Document Revised: 09/07/2014 Document Reviewed: 05/29/2013 Elsevier Interactive Patient Education 2016 [employer].  Oxycodone tablets or capsules What is this medicine? OXYCODONE (ox i KOE done) is a pain reliever. It is  used to treat moderate to severe pain. This medicine may be used for other purposes; ask your health care provider or pharmacist if you have questions. What should I tell my health care provider before I take this medicine? They need to know if you have any of these conditions: -Addison's disease -brain tumor -head injury -heart disease -history of drug or alcohol abuse problem -if you often drink alcohol -kidney disease -liver disease -lung or breathing disease, like asthma -mental illness -pancreatic disease -seizures -thyroid disease -an unusual or allergic reaction to oxycodone, codeine, hydrocodone, morphine, other medicines, foods, dyes, or preservatives -pregnant or trying to get pregnant -breast-feeding How should I use this medicine? Take this medicine by mouth with a glass of water. Follow the directions on the prescription label. You can take it with or without food. If it upsets your stomach, take it with food. Take your medicine at regular intervals. Do not take it more often than directed. Do not stop taking except on your doctor's advice. Some brands of this medicine, like Oxecta, have special instructions. Ask your doctor or pharmacist if these directions are for you: Do  not cut, crush or chew this medicine. Swallow only one tablet at a time. Do not wet, soak, or lick the tablet before you take it. Talk to your pediatrician regarding the use of this medicine in children. Special care may be needed. Overdosage: If you think you have taken too much of this medicine contact a poison control center or emergency room at once. NOTE: This medicine is only for you. Do not share this medicine with others. What if I miss a dose? If you miss a dose, take it as soon as you can. If it is almost time for your next dose, take only that dose. Do not take double or extra doses. What may interact with this medicine? -alcohol -antihistamines -certain medicines used for nausea like  chlorpromazine, droperidol -erythromycin -ketoconazole -medicines for depression, anxiety, or psychotic disturbances -medicines for sleep -muscle relaxants -naloxone -naltrexone -narcotic medicines (opiates) for pain -nilotinib -phenobarbital -phenytoin -rifampin -ritonavir -voriconazole This list may not describe all possible interactions. Give your health care provider a list of all the medicines, herbs, non-prescription drugs, or dietary supplements you use. Also tell them if you smoke, drink alcohol, or use illegal drugs. Some items may interact with your medicine. What should I watch for while using this medicine? Tell your doctor or health care professional if your pain does not go away, if it gets worse, or if you have new or a different type of pain. You may develop tolerance to the medicine. Tolerance means that you will need a higher dose of the medicine for pain relief. Tolerance is normal and is expected if you take this medicine for a long time. Do not suddenly stop taking your medicine because you may develop a severe reaction. Your body becomes used to the medicine. This does NOT mean you are addicted. Addiction is a behavior related to getting and using a drug for a non-medical reason. If you have pain, you have a medical reason to take pain medicine. Your doctor will tell you how much medicine to take. If your doctor wants you to stop the medicine, the dose will be slowly lowered over time to avoid any side effects. You may get drowsy or dizzy when you first start taking this medicine or change doses. Do not drive, use machinery, or do anything that may be dangerous until you know how the medicine affects you. Stand or sit up slowly. There are different types of narcotic medicines (opiates) for pain. If you take more than one type at the same time, you may have more side effects. Give your health care provider a list of all medicines you use. Your doctor will tell you how much  medicine to take. Do not take more medicine than directed. Call emergency for help if you have problems breathing. This medicine will cause constipation. Try to have a bowel movement at least every 2 to 3 days. If you do not have a bowel movement for 3 days, call your doctor or health care professional. Your mouth may get dry. Drinking water, chewing sugarless gum, or sucking on hard candy may help. See your dentist every 6 months. What side effects may I notice from receiving this medicine? Side effects that you should report to your doctor or health care professional as soon as possible: -allergic reactions like skin rash, itching or hives, swelling of the face, lips, or tongue -breathing problems -confusion -feeling faint or lightheaded, falls -trouble passing urine or change in the amount of urine -unusually weak or tired  Side effects that usually do not require medical attention (report to your doctor or health care professional if they continue or are bothersome): -constipation -dry mouth -itching -nausea, vomiting -upset stomach This list may not describe all possible side effects. Call your doctor for medical advice about side effects. You may report side effects to FDA at 1-800-FDA-1088. Where should I keep my medicine? Keep out of the reach of children. This medicine can be abused. Keep your medicine in a safe place to protect it from theft. Do not share this medicine with anyone. Selling or giving away this medicine is dangerous and against the law. Store at room temperature between 15 and 30 degrees C (59 and 86 degrees F). Protect from light. Keep container tightly closed. This medicine may cause accidental overdose and death if it is taken by other adults, children, or pets. Flush any unused medicine down the toilet to reduce the chance of harm. Do not use the medicine after the expiration date. NOTE: This sheet is a summary. It may not cover all possible information. If you have  questions about this medicine, talk to your doctor, pharmacist, or health care provider.    2016, Elsevier/Gold Standard. (2014-12-29 01:15:14)

## 2015-09-13 NOTE — Care Management Note (Signed)
Case Management Note  Patient Details  Name: JOVEY KANTOLA MRN: [id number] Date of Birth: Dec 04, 1960  Subjective/Objective:                    Action/Plan: Cm spoke with Dr Joya Salm. No HH needs at discharge.  Expected Discharge Date:  09/17/15               Expected Discharge Plan:  Home/Self Care  In-House Referral:     Discharge planning Services  CM Consult  Post Acute Care Choice:    Choice offered to:     DME Arranged:    DME Agency:     HH Arranged:    HH Agency:     Status of Service:  Completed, signed off  Medicare Important Message Given:    Date Medicare IM Given:    Medicare IM give by:    Date Additional Medicare IM Given:    Additional Medicare Important Message give by:     If discussed at [place] of Stay Meetings, dates discussed:    Additional Comments:  Rolm Baptise, RN 09/13/2015, 11:20 AM [phone]
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
## 2015-09-25 ENCOUNTER — Encounter: Payer: Self-pay | Admitting: *Deleted

## 2015-09-25 ENCOUNTER — Other Ambulatory Visit: Payer: Self-pay | Admitting: Neurosurgery

## 2015-09-25 ENCOUNTER — Emergency Department
Admission: EM | Admit: 2015-09-25 | Discharge: 2015-09-25 | Disposition: A | Discharge status: Home / Self Care | Payer: Medicaid Other | Attending: Emergency Medicine | Admitting: Emergency Medicine

## 2015-09-25 DIAGNOSIS — Z9889 Other specified postprocedural states: Secondary | ICD-10-CM | POA: Insufficient documentation

## 2015-09-25 DIAGNOSIS — Z7951 Long term (current) use of inhaled steroids: Secondary | ICD-10-CM | POA: Diagnosis not present

## 2015-09-25 DIAGNOSIS — G8929 Other chronic pain: Secondary | ICD-10-CM

## 2015-09-25 DIAGNOSIS — Z79899 Other long term (current) drug therapy: Secondary | ICD-10-CM | POA: Diagnosis not present

## 2015-09-25 DIAGNOSIS — Z792 Long term (current) use of antibiotics: Secondary | ICD-10-CM | POA: Diagnosis not present

## 2015-09-25 DIAGNOSIS — Z880 Allergy status to penicillin: Secondary | ICD-10-CM | POA: Diagnosis not present

## 2015-09-25 DIAGNOSIS — Z87891 Personal history of nicotine dependence: Secondary | ICD-10-CM | POA: Diagnosis not present

## 2015-09-25 DIAGNOSIS — M542 Cervicalgia: Secondary | ICD-10-CM | POA: Diagnosis not present

## 2015-09-25 DIAGNOSIS — M4802 Spinal stenosis, cervical region: Secondary | ICD-10-CM

## 2015-09-25 DIAGNOSIS — G8928 Other chronic postprocedural pain: Secondary | ICD-10-CM | POA: Diagnosis not present

## 2015-09-25 MED ORDER — OXYCODONE HCL 5 MG PO TABS
10.0000 mg | ORAL_TABLET | Freq: Once | ORAL | Status: AC
  Administered 2015-09-25: 10 mg via ORAL
  Filled 2015-09-25: qty 2

## 2015-09-25 MED ORDER — OXYCODONE-ACETAMINOPHEN 5-325 MG PO TABS
2.0000 | ORAL_TABLET | Freq: Once | ORAL | Status: DC
  Filled 2015-09-25: qty 2

## 2015-09-25 NOTE — ED Notes (Signed)
Pt ambulatory to triage.  Pt had surgery jan. 10 for spinal stenosis at [place].  Today, pt continues to have pain in neck and upper back.

## 2015-09-25 NOTE — Discharge Instructions (Signed)
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
## 2015-09-27 ENCOUNTER — Telehealth: Payer: Self-pay

## 2015-09-27 NOTE — Telephone Encounter (Signed)
Pt states has URI and would like antibiotic for congestion and cough?

## 2015-10-01 NOTE — Telephone Encounter (Signed)
Patient has been discharge from practice, 30 day grace period exceeded, re direct to urgent care.

## 2015-10-02 ENCOUNTER — Other Ambulatory Visit: Payer: Medicaid Other

## 2015-10-07 ENCOUNTER — Encounter: Payer: Self-pay | Admitting: Family Medicine

## 2015-10-08 ENCOUNTER — Inpatient Hospital Stay: Admission: RE | Admit: 2015-10-08 | Payer: Medicaid Other | Source: Ambulatory Visit

## 2015-10-26 ENCOUNTER — Emergency Department (HOSPITAL_COMMUNITY)
Admission: EM | Admit: 2015-10-26 | Discharge: 2015-10-26 | Discharge status: Against Medical Advice | Payer: Medicaid Other | Attending: Emergency Medicine | Admitting: Emergency Medicine

## 2015-10-26 ENCOUNTER — Encounter (HOSPITAL_COMMUNITY): Payer: Self-pay | Admitting: Emergency Medicine

## 2015-10-26 DIAGNOSIS — Z8701 Personal history of pneumonia (recurrent): Secondary | ICD-10-CM | POA: Insufficient documentation

## 2015-10-26 DIAGNOSIS — Z792 Long term (current) use of antibiotics: Secondary | ICD-10-CM | POA: Insufficient documentation

## 2015-10-26 DIAGNOSIS — M542 Cervicalgia: Secondary | ICD-10-CM | POA: Diagnosis not present

## 2015-10-26 DIAGNOSIS — Z8614 Personal history of Methicillin resistant Staphylococcus aureus infection: Secondary | ICD-10-CM | POA: Insufficient documentation

## 2015-10-26 DIAGNOSIS — Z79891 Long term (current) use of opiate analgesic: Secondary | ICD-10-CM | POA: Insufficient documentation

## 2015-10-26 DIAGNOSIS — Z981 Arthrodesis status: Secondary | ICD-10-CM | POA: Insufficient documentation

## 2015-10-26 DIAGNOSIS — Z87891 Personal history of nicotine dependence: Secondary | ICD-10-CM | POA: Diagnosis not present

## 2015-10-26 DIAGNOSIS — M549 Dorsalgia, unspecified: Secondary | ICD-10-CM | POA: Diagnosis present

## 2015-10-26 DIAGNOSIS — Z8619 Personal history of other infectious and parasitic diseases: Secondary | ICD-10-CM | POA: Diagnosis not present

## 2015-10-26 DIAGNOSIS — J449 Chronic obstructive pulmonary disease, unspecified: Secondary | ICD-10-CM | POA: Diagnosis not present

## 2015-10-26 DIAGNOSIS — Z87440 Personal history of urinary (tract) infections: Secondary | ICD-10-CM | POA: Diagnosis not present

## 2015-10-26 DIAGNOSIS — G8918 Other acute postprocedural pain: Secondary | ICD-10-CM | POA: Diagnosis not present

## 2015-10-26 DIAGNOSIS — F410 Panic disorder [episodic paroxysmal anxiety] without agoraphobia: Secondary | ICD-10-CM | POA: Diagnosis not present

## 2015-10-26 DIAGNOSIS — M1990 Unspecified osteoarthritis, unspecified site: Secondary | ICD-10-CM | POA: Insufficient documentation

## 2015-10-26 DIAGNOSIS — N189 Chronic kidney disease, unspecified: Secondary | ICD-10-CM | POA: Insufficient documentation

## 2015-10-26 DIAGNOSIS — G8929 Other chronic pain: Secondary | ICD-10-CM | POA: Diagnosis not present

## 2015-10-26 DIAGNOSIS — Z7951 Long term (current) use of inhaled steroids: Secondary | ICD-10-CM | POA: Diagnosis not present

## 2015-10-26 DIAGNOSIS — Z79899 Other long term (current) drug therapy: Secondary | ICD-10-CM | POA: Diagnosis not present

## 2015-10-26 DIAGNOSIS — Z872 Personal history of diseases of the skin and subcutaneous tissue: Secondary | ICD-10-CM | POA: Insufficient documentation

## 2015-10-26 DIAGNOSIS — Z98811 Dental restoration status: Secondary | ICD-10-CM | POA: Insufficient documentation

## 2015-10-26 DIAGNOSIS — R55 Syncope and collapse: Secondary | ICD-10-CM | POA: Diagnosis not present

## 2015-10-26 DIAGNOSIS — Z880 Allergy status to penicillin: Secondary | ICD-10-CM | POA: Diagnosis not present

## 2015-10-26 LAB — BASIC METABOLIC PANEL
ANION GAP: 9 (ref 5–15)
BUN: 20 mg/dL (ref 6–20)
CO2: 26 mmol/L (ref 22–32)
Calcium: 9.6 mg/dL (ref 8.9–10.3)
Chloride: 104 mmol/L (ref 101–111)
Creatinine, Ser: 0.96 mg/dL (ref 0.44–1.00)
GFR calc Af Amer: >60 mL/min (ref >60)
GFR calc non Af Amer: >60 mL/min (ref >60)
GLUCOSE: 88 mg/dL (ref 65–99)
POTASSIUM: 4 mmol/L (ref 3.5–5.1)
Sodium: 139 mmol/L (ref 135–145)

## 2015-10-26 LAB — CBC
HEMATOCRIT: 41.6 % (ref 36.0–46.0)
HEMOGLOBIN: 13.5 g/dL (ref 12.0–15.0)
MCH: 30.5 pg (ref 26.0–34.0)
MCHC: 32.5 g/dL (ref 30.0–36.0)
MCV: 93.9 fL (ref 78.0–100.0)
PLATELETS: 311 10*3/uL (ref 150–400)
RBC: 4.43 MIL/uL (ref 3.87–5.11)
RDW: 14.8 % (ref 11.5–15.5)
WBC: 8 10*3/uL (ref 4.0–10.5)

## 2015-10-26 LAB — I-STAT TROPONIN, ED: Troponin i, poc: 0.01 ng/mL (ref 0.00–0.08)

## 2015-10-26 LAB — CBG MONITORING, ED: GLUCOSE-CAPILLARY: 93 mg/dL (ref 65–99)

## 2015-10-26 MED ORDER — HYDROMORPHONE HCL 2 MG/ML IJ SOLN
2.0000 mg | Freq: Once | INTRAMUSCULAR | Status: AC
  Administered 2015-10-26: 2 mg via INTRAMUSCULAR
  Filled 2015-10-26: qty 1

## 2015-10-26 NOTE — ED Notes (Addendum)
Pt is here for chronic back pain. She states C2 and C3 are fused from an old injury. Pt has other various other spinal injuries including a narrowing of her spinal canal. Pt was sent here by her spinal surgeon who is currently out of town to have her pain managed.Pt had an episode of syncope in the waiting room. She states the pain "takes her pain away" and it feels like she is hit in the back of the head immediately preceding her syncope. Pt's husband stated the pt was unconscious for about a minute and a half two times while she was in the waiting room. The pt's husband alerted staff the second time this happened. Per the pt's husband this has happened at home "a couple of times" Pt did not hit her head or any body part; she slumped over in the chair  Pt also has complaints of rectal bleeding x 3 days. She has a history of prolapsed rectum
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
## 2015-10-26 NOTE — ED Notes (Signed)
Pt and family member were observed storming out of room as soon as pt received her Dilaudid 2 mg injection, apparently with intent to leave ED as fast as they can---- body languages exhibited aggression.

## 2015-10-30 ENCOUNTER — Telehealth: Payer: Self-pay | Admitting: Neurology

## 2015-10-30 ENCOUNTER — Ambulatory Visit (INDEPENDENT_AMBULATORY_CARE_PROVIDER_SITE_OTHER): Payer: Medicaid Other | Admitting: Neurology

## 2015-10-30 ENCOUNTER — Encounter: Payer: Self-pay | Admitting: Neurology

## 2015-10-30 ENCOUNTER — Ambulatory Visit: Payer: Medicaid Other | Admitting: Neurology

## 2015-10-30 VITALS — BP 129/83 | HR 94 | Ht 60.0 in | Wt 145.0 lb

## 2015-10-30 DIAGNOSIS — G8929 Other chronic pain: Secondary | ICD-10-CM

## 2015-10-30 DIAGNOSIS — M549 Dorsalgia, unspecified: Secondary | ICD-10-CM

## 2015-10-30 DIAGNOSIS — M4802 Spinal stenosis, cervical region: Secondary | ICD-10-CM | POA: Diagnosis not present

## 2015-10-30 MED ORDER — OXYCODONE HCL 15 MG PO TABS: 15.0000 mg | ORAL_TABLET | Freq: Four times a day (QID) | ORAL | Status: DC | PRN

## 2015-10-30 NOTE — Telephone Encounter (Addendum)
Ok per Dr. Krista Blue - pharmacy has been called and they will fill the prescription.
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
## 2015-10-30 NOTE — Telephone Encounter (Signed)
Ro with [facility] in [place] is calling regarding the Rx for oxyCODONE (ROXICODONE) 15 MG immediate release tablet that was given to the patient today. He states the patient already is taking  Oxycodone 30mg  filled on 10-07-15 and is now out of this medication. Ro wants to be sure it is ok to fill this Rx for oxyCODONE (ROXICODONE) 15 MG immediate release tablet.[name]

## 2015-10-30 NOTE — Telephone Encounter (Signed)
Pt called back frantic at 4:41pm today wanting to know if Dr Krista Blue had called pharmacy. I explained Dr Krista Blue was still with pt's and would check her messages at the end of the day. She began to get teary and said if I don't get this medication I won't sleep tonight! She apologized for calling again, I told her it was all right and she hung up.

## 2015-10-30 NOTE — Telephone Encounter (Signed)
I called pharmacist, she had Rx oxycodone 30mg  120 in Feb 6th 2017, oxycodone 20 mg 95 tablets in September 05 2015,  I authorized her pharmacy to refill oxycodone 15 mg 90 tablets today

## 2015-10-30 NOTE — Telephone Encounter (Signed)
Pt called said she took oxyCODONE (ROXICODONE) 15 MG immediate release [facility] [place] and he said if Dr Krista Blue would call and verbal ok he will fill RX. Pt said Dr Krista Blue told her she could take 2 tabs at a time if needed.

## 2015-10-31 ENCOUNTER — Other Ambulatory Visit: Payer: Self-pay | Admitting: Neurology

## 2015-11-05 ENCOUNTER — Ambulatory Visit: Payer: Medicaid Other | Admitting: Family Medicine

## 2015-11-22 ENCOUNTER — Emergency Department (HOSPITAL_COMMUNITY)
Admission: EM | Admit: 2015-11-22 | Discharge: 2015-11-22 | Disposition: A | Discharge status: Home / Self Care | Payer: Medicaid Other | Attending: Emergency Medicine | Admitting: Emergency Medicine

## 2015-11-22 ENCOUNTER — Ambulatory Visit: Payer: Medicaid Other | Admitting: Family Medicine

## 2015-11-22 ENCOUNTER — Encounter (HOSPITAL_COMMUNITY): Payer: Self-pay | Admitting: Emergency Medicine

## 2015-11-22 ENCOUNTER — Emergency Department (HOSPITAL_COMMUNITY): Payer: Medicaid Other

## 2015-11-22 DIAGNOSIS — Z8701 Personal history of pneumonia (recurrent): Secondary | ICD-10-CM | POA: Insufficient documentation

## 2015-11-22 DIAGNOSIS — G8929 Other chronic pain: Secondary | ICD-10-CM | POA: Diagnosis not present

## 2015-11-22 DIAGNOSIS — J449 Chronic obstructive pulmonary disease, unspecified: Secondary | ICD-10-CM | POA: Insufficient documentation

## 2015-11-22 DIAGNOSIS — Z87442 Personal history of urinary calculi: Secondary | ICD-10-CM | POA: Diagnosis not present

## 2015-11-22 DIAGNOSIS — W01198A Fall on same level from slipping, tripping and stumbling with subsequent striking against other object, initial encounter: Secondary | ICD-10-CM | POA: Diagnosis not present

## 2015-11-22 DIAGNOSIS — F411 Generalized anxiety disorder: Secondary | ICD-10-CM | POA: Diagnosis not present

## 2015-11-22 DIAGNOSIS — Z872 Personal history of diseases of the skin and subcutaneous tissue: Secondary | ICD-10-CM | POA: Insufficient documentation

## 2015-11-22 DIAGNOSIS — Y9301 Activity, walking, marching and hiking: Secondary | ICD-10-CM | POA: Diagnosis not present

## 2015-11-22 DIAGNOSIS — Z87891 Personal history of nicotine dependence: Secondary | ICD-10-CM | POA: Diagnosis not present

## 2015-11-22 DIAGNOSIS — Y998 Other external cause status: Secondary | ICD-10-CM | POA: Diagnosis not present

## 2015-11-22 DIAGNOSIS — S29002A Unspecified injury of muscle and tendon of back wall of thorax, initial encounter: Secondary | ICD-10-CM | POA: Diagnosis not present

## 2015-11-22 DIAGNOSIS — Y9289 Other specified places as the place of occurrence of the external cause: Secondary | ICD-10-CM | POA: Insufficient documentation

## 2015-11-22 DIAGNOSIS — Z79899 Other long term (current) drug therapy: Secondary | ICD-10-CM | POA: Diagnosis not present

## 2015-11-22 DIAGNOSIS — S3992XA Unspecified injury of lower back, initial encounter: Secondary | ICD-10-CM | POA: Insufficient documentation

## 2015-11-22 DIAGNOSIS — Z8619 Personal history of other infectious and parasitic diseases: Secondary | ICD-10-CM | POA: Diagnosis not present

## 2015-11-22 DIAGNOSIS — Q438 Other specified congenital malformations of intestine: Secondary | ICD-10-CM | POA: Diagnosis not present

## 2015-11-22 DIAGNOSIS — Z880 Allergy status to penicillin: Secondary | ICD-10-CM | POA: Insufficient documentation

## 2015-11-22 DIAGNOSIS — W19XXXA Unspecified fall, initial encounter: Secondary | ICD-10-CM

## 2015-11-22 DIAGNOSIS — S199XXA Unspecified injury of neck, initial encounter: Secondary | ICD-10-CM | POA: Insufficient documentation

## 2015-11-22 DIAGNOSIS — Z8614 Personal history of Methicillin resistant Staphylococcus aureus infection: Secondary | ICD-10-CM | POA: Diagnosis not present

## 2015-11-22 DIAGNOSIS — N189 Chronic kidney disease, unspecified: Secondary | ICD-10-CM | POA: Diagnosis not present

## 2015-11-22 MED ORDER — HYDROMORPHONE HCL 1 MG/ML IJ SOLN
2.0000 mg | Freq: Once | INTRAMUSCULAR | Status: DC
  Filled 2015-11-22: qty 2

## 2015-11-22 MED ORDER — HYDROMORPHONE HCL 1 MG/ML IJ SOLN
2.0000 mg | Freq: Once | INTRAMUSCULAR | Status: AC
  Administered 2015-11-22: 2 mg via INTRAMUSCULAR
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
## 2015-11-22 NOTE — ED Provider Notes (Signed)
The patient does have chronic neck pain, she presents 5 days after having a fall out of state when she slipped on ice and struck the back, her neck in the back of her head. She has had constant pain since that time but denies any new neurologic symptoms. She is already under the care of both a neurologist and the neurosurgeon and has had a recent anterior cervical fusion. On exam the patient is able to move all 4 extremities, she moves her head from side to side, she does have tenderness over the posterior spine and a small amount of tenderness over the posterior scalp but there is no signs of hematoma or injury to the skin. Imaging pending, the patient will need to follow-up in the outpatient setting, she will be given a single dose of pain medicine but has R he had 30 mg of oxycodone this morning 5 hours prior to arrival.  The pt and her husband were here stating that they were sent by their Neurosurgeon Dr. Joya Salm - this story did not stay consistent and there was question as to whether there was a true f/u for the same day - they initially said they were told to get CT scans in the ED and then go to the office - later this story was backtracked - they wanted me to call Dr. Joya Salm with the results - paged without answer - pt left in good condition.  Medical screening examination/treatment/procedure(s) were conducted as a shared visit with non-physician practitioner(s) and myself.  I personally evaluated the patient during the encounter.  Clinical Impression:   Final diagnoses:  Fall, initial encounter         Jamie Chapel, MD 11/23/15 0830

## 2015-11-22 NOTE — ED Notes (Signed)
Onset 5 days ago slipped in snow landed on back and posterior head, LOC for couple of seconds. States chronic upper, middle, lower back pain from spinal stenosis. Recently had neck surgery 8 weeks ago and scheduled two more surgeries in the future. Since slipping and falling increased pain lower back 9/10 achy sore sharp radiating to bilateral feet. Full sensation bilaterally. Spoke with Doctors office one day ago sent to ED for evaluation.

## 2015-11-22 NOTE — ED Notes (Signed)
Placed a fall risk band on and yellow socks

## 2015-12-09 ENCOUNTER — Telehealth: Payer: Self-pay | Admitting: Neurology

## 2015-12-09 NOTE — Telephone Encounter (Signed)
Pt has recently had surgery in Jan. She called to schedule an appt to be seen due to her pain level. She is being told by the surgeon to see her neurologist. Please call and advise [phone]

## 2015-12-09 NOTE — Telephone Encounter (Signed)
Called patient back - no answer - left message to call back at her convenience to schedule a follow up appt.

## 2015-12-10 NOTE — Telephone Encounter (Signed)
Called pt back. Scheduled earlier f/u on 4/24 at 4pm. Check in 345pm. Pt verbalized understanding and appreciation.

## 2015-12-10 NOTE — Telephone Encounter (Signed)
Patient returned Michelle's call. Would like sooner appointment than what is currently scheduled 12/31/15.

## 2015-12-23 ENCOUNTER — Ambulatory Visit (INDEPENDENT_AMBULATORY_CARE_PROVIDER_SITE_OTHER): Payer: Medicaid Other | Admitting: Neurology

## 2015-12-23 ENCOUNTER — Encounter: Payer: Self-pay | Admitting: Neurology

## 2015-12-23 VITALS — BP 165/118 | HR 84 | Ht 60.0 in | Wt 143.0 lb

## 2015-12-23 DIAGNOSIS — M549 Dorsalgia, unspecified: Secondary | ICD-10-CM

## 2015-12-23 DIAGNOSIS — M4806 Spinal stenosis, lumbar region: Secondary | ICD-10-CM

## 2015-12-23 DIAGNOSIS — G8929 Other chronic pain: Secondary | ICD-10-CM

## 2015-12-23 DIAGNOSIS — M4802 Spinal stenosis, cervical region: Secondary | ICD-10-CM

## 2015-12-23 DIAGNOSIS — M48061 Spinal stenosis, lumbar region without neurogenic claudication: Secondary | ICD-10-CM

## 2015-12-23 MED ORDER — OXYCODONE HCL 10 MG PO TABS: 10.0000 mg | ORAL_TABLET | Freq: Four times a day (QID) | ORAL | Status: DC
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
## 2015-12-24 ENCOUNTER — Ambulatory Visit: Payer: Medicaid Other | Admitting: Family Medicine

## 2015-12-31 ENCOUNTER — Ambulatory Visit: Payer: Medicaid Other | Admitting: Neurology

## 2016-01-08 ENCOUNTER — Ambulatory Visit: Payer: Medicaid Other | Admitting: Family Medicine

## 2016-01-21 ENCOUNTER — Encounter: Payer: Self-pay | Admitting: Family Medicine

## 2016-01-22 ENCOUNTER — Telehealth: Payer: Self-pay | Admitting: Neurology

## 2016-01-22 ENCOUNTER — Ambulatory Visit (INDEPENDENT_AMBULATORY_CARE_PROVIDER_SITE_OTHER): Payer: Medicaid Other | Admitting: Family Medicine

## 2016-01-22 ENCOUNTER — Encounter: Payer: Self-pay | Admitting: Family Medicine

## 2016-01-22 VITALS — BP 125/86 | HR 87 | Temp 98.4°F | Resp 16 | Ht 60.0 in | Wt 142.3 lb

## 2016-01-22 DIAGNOSIS — E034 Atrophy of thyroid (acquired): Secondary | ICD-10-CM | POA: Diagnosis not present

## 2016-01-22 DIAGNOSIS — J441 Chronic obstructive pulmonary disease with (acute) exacerbation: Secondary | ICD-10-CM | POA: Diagnosis not present

## 2016-01-22 DIAGNOSIS — M4806 Spinal stenosis, lumbar region: Secondary | ICD-10-CM

## 2016-01-22 DIAGNOSIS — E785 Hyperlipidemia, unspecified: Secondary | ICD-10-CM

## 2016-01-22 DIAGNOSIS — F3341 Major depressive disorder, recurrent, in partial remission: Secondary | ICD-10-CM

## 2016-01-22 DIAGNOSIS — G8929 Other chronic pain: Secondary | ICD-10-CM

## 2016-01-22 DIAGNOSIS — R252 Cramp and spasm: Secondary | ICD-10-CM

## 2016-01-22 DIAGNOSIS — E038 Other specified hypothyroidism: Secondary | ICD-10-CM

## 2016-01-22 DIAGNOSIS — M2550 Pain in unspecified joint: Secondary | ICD-10-CM | POA: Diagnosis not present

## 2016-01-22 DIAGNOSIS — E559 Vitamin D deficiency, unspecified: Secondary | ICD-10-CM

## 2016-01-22 DIAGNOSIS — M48061 Spinal stenosis, lumbar region without neurogenic claudication: Secondary | ICD-10-CM

## 2016-01-22 DIAGNOSIS — M549 Dorsalgia, unspecified: Secondary | ICD-10-CM

## 2016-01-22 MED ORDER — MAGNESIUM 250 MG PO TABS: 1.0000 | ORAL_TABLET | Freq: Every day | ORAL | Status: DC

## 2016-01-22 MED ORDER — PREDNISONE 20 MG PO TABS: 40.0000 mg | ORAL_TABLET | Freq: Every day | ORAL | Status: DC

## 2016-01-22 MED ORDER — ALBUTEROL SULFATE (2.5 MG/3ML) 0.083% IN NEBU: 2.5000 mg | INHALATION_SOLUTION | Freq: Four times a day (QID) | RESPIRATORY_TRACT | Status: DC | PRN

## 2016-01-22 MED ORDER — CARISOPRODOL 350 MG PO TABS: 350.0000 mg | ORAL_TABLET | Freq: Three times a day (TID) | ORAL | Status: DC

## 2016-01-22 NOTE — Patient Instructions (Signed)
I think you have a COPD exacerbation today. Take prednisone 40mg  once daily for 5 days to help with your symptoms. Use rescue inhaler as needed. I have refilled your nebs as well.  Please seek immediate medical attention if you develop shortness of breath not relieve by inhaler, chest pain/tightness, fever > 103 F or other concerning symptoms.    You can make an appt to come back and get lab work.

## 2016-01-22 NOTE — Assessment & Plan Note (Signed)
Managed by psychiatry- Dr. Kasandra Knudsen- pt encouraged to continue to follow his recommendations.

## 2016-01-22 NOTE — Telephone Encounter (Signed)
Pt is called inquiring where referral was sent for pain management. Please call

## 2016-01-22 NOTE — Assessment & Plan Note (Signed)
Pt is aware that this office does not prescribe pain medication for chronic issues. She is awaiting chronic pain referral that was placed my neurology. Multiple controlled medications from multiple providers. She is requesting [name] today to help with pain- I have agreed to give her a 1 month supply prior to her surgery. No further controlled medications from this office. Per [place] CSRS she has not filled SOMA since 11/26/2015.
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
## 2016-01-22 NOTE — Assessment & Plan Note (Signed)
Noted in history. Pt is not on therapy. Will check TSH to determine if therapy is needed.

## 2016-01-22 NOTE — Assessment & Plan Note (Signed)
Check lipid panel to determine if statin therapy is needed.

## 2016-01-22 NOTE — Telephone Encounter (Signed)
Patient wanted telephone number for [facility].   [phone].

## 2016-01-22 NOTE — Assessment & Plan Note (Signed)
Recheck vitamin D levels 

## 2016-01-22 NOTE — Assessment & Plan Note (Signed)
Followed by neurosurgery- awaiting decompression in July 2017.

## 2016-01-22 NOTE — Assessment & Plan Note (Signed)
Recommend magnesium for muscle cramping.

## 2016-01-22 NOTE — Assessment & Plan Note (Signed)
Treat for exacerbation today with prednisone. Refilled nebulizers. Plan for spirometry to assess lung function in near future. Alarm symptoms reviewed. Recheck lungs next month. With continue breathing issues consider spirva or advair.

## 2016-01-23 ENCOUNTER — Telehealth: Payer: Self-pay | Admitting: Family Medicine

## 2016-01-23 ENCOUNTER — Telehealth: Payer: Self-pay | Admitting: *Deleted

## 2016-01-23 DIAGNOSIS — M549 Dorsalgia, unspecified: Secondary | ICD-10-CM

## 2016-01-23 DIAGNOSIS — M2550 Pain in unspecified joint: Principal | ICD-10-CM

## 2016-01-23 DIAGNOSIS — G8929 Other chronic pain: Secondary | ICD-10-CM

## 2016-01-23 NOTE — Telephone Encounter (Signed)
Message For: OFFICE               Taken 25-MAY-17 at 10:11AM by DEF ------------------------------------------------------------ Jamie Schultz               CID [id number]  Patient SAME                 Pt's Dr Krista Blue          Area Code 336 Phone# [phone] * DOB [phone]     RE NEEDS ANOTHER PAIN [facility] REF'L/WILL NOT          ACCEPT AT THE [facility] WAS REF'D                          Disp:Y/N N If Y = C/B If No Response In 73minutes ============================================================

## 2016-01-23 NOTE — Telephone Encounter (Signed)
Called and spoke to patient and she relayed [facility] could not take her on. [phone]. Sent all records to [facility]. Patient is aware of details and is fine with this.

## 2016-01-23 NOTE — Telephone Encounter (Signed)
Pt said it's been a couple of years since she went to [facility] so she needs a new referral with ins info faxed to [phone].  She could not remember the doc's name.  Her call back number is [phone]

## 2016-01-24 ENCOUNTER — Other Ambulatory Visit: Payer: Self-pay | Admitting: Family Medicine

## 2016-01-28 NOTE — Telephone Encounter (Signed)
Referral placed.

## 2016-01-29 ENCOUNTER — Telehealth: Payer: Self-pay | Admitting: *Deleted

## 2016-01-29 ENCOUNTER — Other Ambulatory Visit: Payer: Self-pay | Admitting: Family Medicine

## 2016-01-29 NOTE — Telephone Encounter (Signed)
PA for Lyrica started. Awaiting signature AK before faxing to [place].

## 2016-02-06 ENCOUNTER — Telehealth: Payer: Self-pay

## 2016-02-06 NOTE — Telephone Encounter (Signed)
PA already approved for Lyrica 100mg  and Belsomra. Left pt message.[place]

## 2016-02-06 NOTE — Telephone Encounter (Signed)
Call ref # to [facility] [phone]

## 2016-02-06 NOTE — Telephone Encounter (Signed)
Patient called stating she needs a [place] for [employer] [facility].  She has appt on 02/12/16 @ 4:00 for injection.  Patient also asked about getting her Lyrica approved with her insurance.  Please advise patient

## 2016-02-07 ENCOUNTER — Telehealth: Payer: Self-pay | Admitting: *Deleted

## 2016-02-12 NOTE — Telephone Encounter (Signed)
Several attempts/messages left for patient to contact office in regards to PA for [facility] appt. Patient has not returned call.[place]

## 2016-02-14 ENCOUNTER — Emergency Department: Payer: Medicaid Other

## 2016-02-14 ENCOUNTER — Emergency Department
Admission: EM | Admit: 2016-02-14 | Discharge: 2016-02-14 | Disposition: A | Discharge status: Home / Self Care | Payer: Medicaid Other | Attending: Emergency Medicine | Admitting: Emergency Medicine

## 2016-02-14 DIAGNOSIS — J449 Chronic obstructive pulmonary disease, unspecified: Secondary | ICD-10-CM | POA: Diagnosis not present

## 2016-02-14 DIAGNOSIS — N189 Chronic kidney disease, unspecified: Secondary | ICD-10-CM | POA: Insufficient documentation

## 2016-02-14 DIAGNOSIS — F17210 Nicotine dependence, cigarettes, uncomplicated: Secondary | ICD-10-CM | POA: Diagnosis not present

## 2016-02-14 DIAGNOSIS — W03XXXA Other fall on same level due to collision with another person, initial encounter: Secondary | ICD-10-CM | POA: Insufficient documentation

## 2016-02-14 DIAGNOSIS — S8002XA Contusion of left knee, initial encounter: Secondary | ICD-10-CM | POA: Insufficient documentation

## 2016-02-14 DIAGNOSIS — Y939 Activity, unspecified: Secondary | ICD-10-CM | POA: Diagnosis not present

## 2016-02-14 DIAGNOSIS — S8992XA Unspecified injury of left lower leg, initial encounter: Secondary | ICD-10-CM | POA: Diagnosis present

## 2016-02-14 DIAGNOSIS — S8012XA Contusion of left lower leg, initial encounter: Secondary | ICD-10-CM

## 2016-02-14 DIAGNOSIS — Y929 Unspecified place or not applicable: Secondary | ICD-10-CM | POA: Diagnosis not present

## 2016-02-14 DIAGNOSIS — Y999 Unspecified external cause status: Secondary | ICD-10-CM | POA: Insufficient documentation

## 2016-02-14 MED ORDER — MORPHINE SULFATE (PF) 4 MG/ML IV SOLN
4.0000 mg | Freq: Once | INTRAVENOUS | Status: AC
  Administered 2016-02-14: 4 mg via INTRAMUSCULAR
  Filled 2016-02-14: qty 1
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
## 2016-02-14 NOTE — Discharge Instructions (Signed)
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
## 2016-02-14 NOTE — ED Notes (Signed)
Patient transported to X-ray 

## 2016-02-20 ENCOUNTER — Other Ambulatory Visit: Payer: Self-pay | Admitting: Family Medicine

## 2016-02-20 ENCOUNTER — Telehealth: Payer: Self-pay | Admitting: Family Medicine

## 2016-02-20 NOTE — Telephone Encounter (Signed)
The pharmacy is faxing a refill request.  She said she will schedule an appt as soon as she gets her new medicaid card with Rocco Serene on it.  She will be starting with the pain clinic soon and is scheduled for back surgery in 2 weeks.  Also Dr. Kasandra Knudsen will be doing labs and she will make sure a copy of the results are fax over.

## 2016-02-20 NOTE — Telephone Encounter (Signed)
Pt is receiving diazepam from Dr. Joya Salm so I cannot refill her [name] today. Have called patient and left a message for her to call our office back.

## 2016-03-02 ENCOUNTER — Encounter: Payer: Self-pay | Admitting: Emergency Medicine

## 2016-03-02 ENCOUNTER — Inpatient Hospital Stay
Admission: EM | Admit: 2016-03-02 | Discharge: 2016-03-03 | DRG: 189 | Disposition: A | Discharge status: Home / Self Care | Payer: Medicaid Other | Attending: Internal Medicine | Admitting: Internal Medicine

## 2016-03-02 ENCOUNTER — Emergency Department: Payer: Medicaid Other

## 2016-03-02 DIAGNOSIS — K5900 Constipation, unspecified: Secondary | ICD-10-CM | POA: Diagnosis present

## 2016-03-02 DIAGNOSIS — Z825 Family history of asthma and other chronic lower respiratory diseases: Secondary | ICD-10-CM | POA: Diagnosis not present

## 2016-03-02 DIAGNOSIS — J441 Chronic obstructive pulmonary disease with (acute) exacerbation: Secondary | ICD-10-CM | POA: Diagnosis present

## 2016-03-02 DIAGNOSIS — Z823 Family history of stroke: Secondary | ICD-10-CM | POA: Diagnosis not present

## 2016-03-02 DIAGNOSIS — G8929 Other chronic pain: Secondary | ICD-10-CM | POA: Diagnosis present

## 2016-03-02 DIAGNOSIS — Z880 Allergy status to penicillin: Secondary | ICD-10-CM

## 2016-03-02 DIAGNOSIS — Z90710 Acquired absence of both cervix and uterus: Secondary | ICD-10-CM

## 2016-03-02 DIAGNOSIS — M1990 Unspecified osteoarthritis, unspecified site: Secondary | ICD-10-CM | POA: Diagnosis present

## 2016-03-02 DIAGNOSIS — Z885 Allergy status to narcotic agent status: Secondary | ICD-10-CM | POA: Diagnosis not present

## 2016-03-02 DIAGNOSIS — M549 Dorsalgia, unspecified: Secondary | ICD-10-CM | POA: Diagnosis present

## 2016-03-02 DIAGNOSIS — Z8249 Family history of ischemic heart disease and other diseases of the circulatory system: Secondary | ICD-10-CM

## 2016-03-02 DIAGNOSIS — F17210 Nicotine dependence, cigarettes, uncomplicated: Secondary | ICD-10-CM | POA: Diagnosis present

## 2016-03-02 DIAGNOSIS — Z981 Arthrodesis status: Secondary | ICD-10-CM | POA: Diagnosis not present

## 2016-03-02 DIAGNOSIS — Z882 Allergy status to sulfonamides status: Secondary | ICD-10-CM

## 2016-03-02 DIAGNOSIS — Z9889 Other specified postprocedural states: Secondary | ICD-10-CM

## 2016-03-02 DIAGNOSIS — Z9660 Presence of unspecified orthopedic joint implant: Secondary | ICD-10-CM | POA: Diagnosis present

## 2016-03-02 DIAGNOSIS — J9622 Acute and chronic respiratory failure with hypercapnia: Secondary | ICD-10-CM | POA: Diagnosis present

## 2016-03-02 DIAGNOSIS — K623 Rectal prolapse: Secondary | ICD-10-CM | POA: Diagnosis present

## 2016-03-02 DIAGNOSIS — Z888 Allergy status to other drugs, medicaments and biological substances status: Secondary | ICD-10-CM

## 2016-03-02 DIAGNOSIS — J9621 Acute and chronic respiratory failure with hypoxia: Secondary | ICD-10-CM | POA: Diagnosis present

## 2016-03-02 DIAGNOSIS — Z8614 Personal history of Methicillin resistant Staphylococcus aureus infection: Secondary | ICD-10-CM | POA: Diagnosis not present

## 2016-03-02 DIAGNOSIS — M797 Fibromyalgia: Secondary | ICD-10-CM | POA: Diagnosis present

## 2016-03-02 DIAGNOSIS — Z801 Family history of malignant neoplasm of trachea, bronchus and lung: Secondary | ICD-10-CM

## 2016-03-02 LAB — CBC WITH DIFFERENTIAL/PLATELET
BASOS ABS: 0.1 10*3/uL (ref 0–0.1)
Basophils Relative: 1 %
EOS ABS: 1.3 10*3/uL — AB (ref 0–0.7)
HCT: 41.2 % (ref 35.0–47.0)
Hemoglobin: 13.7 g/dL (ref 12.0–16.0)
Lymphs Abs: 2.5 10*3/uL (ref 1.0–3.6)
MCH: 31.2 pg (ref 26.0–34.0)
MCHC: 33.2 g/dL (ref 32.0–36.0)
MCV: 93.8 fL (ref 80.0–100.0)
MONO ABS: 0.6 10*3/uL (ref 0.2–0.9)
Monocytes Relative: 6 %
Neutro Abs: 5.9 10*3/uL (ref 1.4–6.5)
PLATELETS: 250 10*3/uL (ref 150–440)
RBC: 4.39 MIL/uL (ref 3.80–5.20)
RDW: 15.6 % — AB (ref 11.5–14.5)
WBC: 10.4 10*3/uL (ref 3.6–11.0)

## 2016-03-02 LAB — TROPONIN I
Troponin I: <0.03 ng/mL (ref <0.03)
Troponin I: <0.03 ng/mL (ref <0.03)
Troponin I: <0.03 ng/mL (ref <0.03)
Troponin I: <0.03 ng/mL (ref <0.03)

## 2016-03-02 LAB — COMPREHENSIVE METABOLIC PANEL
ALBUMIN: 4.2 g/dL (ref 3.5–5.0)
ALT: 28 U/L (ref 14–54)
AST: 25 U/L (ref 15–41)
Alkaline Phosphatase: 65 U/L (ref 38–126)
Anion gap: 7 (ref 5–15)
BUN: 19 mg/dL (ref 6–20)
CHLORIDE: 108 mmol/L (ref 101–111)
CO2: 25 mmol/L (ref 22–32)
CREATININE: 0.92 mg/dL (ref 0.44–1.00)
Calcium: 9 mg/dL (ref 8.9–10.3)
GFR calc Af Amer: >60 mL/min (ref >60)
GLUCOSE: 137 mg/dL — AB (ref 65–99)
Potassium: 3.9 mmol/L (ref 3.5–5.1)
Sodium: 140 mmol/L (ref 135–145)
Total Bilirubin: 0.4 mg/dL (ref 0.3–1.2)
Total Protein: 7 g/dL (ref 6.5–8.1)

## 2016-03-02 LAB — BLOOD GAS, ARTERIAL
Acid-Base Excess: 1.8 mmol/L (ref 0.0–3.0)
BICARBONATE: 28.2 meq/L — AB (ref 21.0–28.0)
DELIVERY SYSTEMS: POSITIVE
EXPIRATORY PAP: 6
FIO2: 0.36
Inspiratory PAP: 14
MECHANICAL RATE: 12
O2 Saturation: 97.4 %
PATIENT TEMPERATURE: 37
PH ART: 7.36 (ref 7.350–7.450)
pCO2 arterial: 50 mmHg — ABNORMAL HIGH (ref 32.0–48.0)
pO2, Arterial: 99 mmHg (ref 83.0–108.0)

## 2016-03-02 LAB — TSH: TSH: 3.287 u[IU]/mL (ref 0.350–4.500)

## 2016-03-02 LAB — GLUCOSE, CAPILLARY: Glucose-Capillary: 168 mg/dL — ABNORMAL HIGH (ref 65–99)

## 2016-03-02 LAB — HEMOGLOBIN A1C: Hgb A1c MFr Bld: 6.3 % — ABNORMAL HIGH (ref 4.0–6.0)

## 2016-03-02 LAB — BRAIN NATRIURETIC PEPTIDE: B NATRIURETIC PEPTIDE 5: 47 pg/mL (ref 0.0–100.0)

## 2016-03-02 MED ORDER — TIOTROPIUM BROMIDE MONOHYDRATE 18 MCG IN CAPS
18.0000 ug | ORAL_CAPSULE | Freq: Every day | RESPIRATORY_TRACT | Status: DC
  Administered 2016-03-02 – 2016-03-03 (×2): 18 ug via RESPIRATORY_TRACT
  Filled 2016-03-02: qty 5

## 2016-03-02 MED ORDER — PREDNISONE 10 MG PO TABS: 10.0000 mg | ORAL_TABLET | Freq: Every day | ORAL | Status: DC

## 2016-03-02 MED ORDER — ALBUTEROL SULFATE (2.5 MG/3ML) 0.083% IN NEBU
2.5000 mg | INHALATION_SOLUTION | RESPIRATORY_TRACT | Status: DC
  Administered 2016-03-02 – 2016-03-03 (×5): 2.5 mg via RESPIRATORY_TRACT
  Filled 2016-03-02 (×8): qty 3

## 2016-03-02 MED ORDER — METHYLPREDNISOLONE SODIUM SUCC 125 MG IJ SOLR
125.0000 mg | Freq: Once | INTRAMUSCULAR | Status: AC
  Administered 2016-03-02: 125 mg via INTRAVENOUS
  Filled 2016-03-02: qty 2

## 2016-03-02 MED ORDER — AZITHROMYCIN 250 MG PO TABS
250.0000 mg | ORAL_TABLET | Freq: Every day | ORAL | Status: DC
  Administered 2016-03-03: 08:00:00 250 mg via ORAL
  Filled 2016-03-02: qty 1

## 2016-03-02 MED ORDER — ALPRAZOLAM 1 MG PO TABS
2.0000 mg | ORAL_TABLET | Freq: Three times a day (TID) | ORAL | Status: DC
  Administered 2016-03-02: 2 mg via ORAL
  Filled 2016-03-02: qty 2

## 2016-03-02 MED ORDER — VITAMIN E 180 MG (400 UNIT) PO CAPS
400.0000 [IU] | ORAL_CAPSULE | Freq: Every day | ORAL | Status: DC
  Administered 2016-03-02 – 2016-03-03 (×2): 400 [IU] via ORAL
  Filled 2016-03-02 (×2): qty 1

## 2016-03-02 MED ORDER — CETYLPYRIDINIUM CHLORIDE 0.05 % MT LIQD
7.0000 mL | Freq: Two times a day (BID) | OROMUCOSAL | Status: DC
  Administered 2016-03-02 (×2): 7 mL via OROMUCOSAL

## 2016-03-02 MED ORDER — FLUTICASONE PROPIONATE 50 MCG/ACT NA SUSP
1.0000 | Freq: Every day | NASAL | Status: DC | PRN
  Filled 2016-03-02: qty 16

## 2016-03-02 MED ORDER — ACETAMINOPHEN 325 MG PO TABS: 650.0000 mg | ORAL_TABLET | Freq: Four times a day (QID) | ORAL | Status: DC | PRN

## 2016-03-02 MED ORDER — IPRATROPIUM-ALBUTEROL 0.5-2.5 (3) MG/3ML IN SOLN
3.0000 mL | Freq: Once | RESPIRATORY_TRACT | Status: AC
  Administered 2016-03-02: 3 mL via RESPIRATORY_TRACT
  Filled 2016-03-02: qty 3

## 2016-03-02 MED ORDER — OXYCODONE HCL 5 MG PO TABS
15.0000 mg | ORAL_TABLET | Freq: Four times a day (QID) | ORAL | Status: DC | PRN
  Administered 2016-03-02 – 2016-03-03 (×3): 15 mg via ORAL
  Filled 2016-03-02 (×3): qty 3

## 2016-03-02 MED ORDER — ADULT MULTIVITAMIN W/MINERALS CH
1.0000 | ORAL_TABLET | Freq: Every day | ORAL | Status: DC
  Administered 2016-03-02 – 2016-03-03 (×2): 1 via ORAL
  Filled 2016-03-02 (×2): qty 1

## 2016-03-02 MED ORDER — ONDANSETRON HCL 4 MG PO TABS: 4.0000 mg | ORAL_TABLET | Freq: Four times a day (QID) | ORAL | Status: DC | PRN

## 2016-03-02 MED ORDER — PROMETHAZINE HCL 25 MG PO TABS: 25.0000 mg | ORAL_TABLET | Freq: Three times a day (TID) | ORAL | Status: DC | PRN

## 2016-03-02 MED ORDER — PREGABALIN 50 MG PO CAPS
100.0000 mg | ORAL_CAPSULE | Freq: Three times a day (TID) | ORAL | Status: DC
  Administered 2016-03-02 – 2016-03-03 (×4): 100 mg via ORAL
  Filled 2016-03-02: qty 1
  Filled 2016-03-02 (×3): qty 2

## 2016-03-02 MED ORDER — SUVOREXANT 20 MG PO TABS: 1.0000 | ORAL_TABLET | Freq: Every day | ORAL | Status: DC

## 2016-03-02 MED ORDER — MAGNESIUM OXIDE 400 (241.3 MG) MG PO TABS
400.0000 mg | ORAL_TABLET | Freq: Every day | ORAL | Status: DC
  Administered 2016-03-02: 22:00:00 400 mg via ORAL
  Filled 2016-03-02: qty 1

## 2016-03-02 MED ORDER — DIAZEPAM 5 MG PO TABS: 10.0000 mg | ORAL_TABLET | Freq: Three times a day (TID) | ORAL | Status: DC | PRN

## 2016-03-02 MED ORDER — DOCUSATE SODIUM 100 MG PO CAPS
100.0000 mg | ORAL_CAPSULE | Freq: Two times a day (BID) | ORAL | Status: DC
  Administered 2016-03-02 – 2016-03-03 (×3): 100 mg via ORAL
  Filled 2016-03-02 (×3): qty 1

## 2016-03-02 MED ORDER — AMPHETAMINE-DEXTROAMPHETAMINE 5 MG PO TABS
15.0000 mg | ORAL_TABLET | Freq: Two times a day (BID) | ORAL | Status: DC
  Administered 2016-03-02 – 2016-03-03 (×2): 15 mg via ORAL
  Filled 2016-03-02 (×2): qty 3

## 2016-03-02 MED ORDER — LORAZEPAM 2 MG/ML IJ SOLN
1.0000 mg | Freq: Once | INTRAMUSCULAR | Status: AC
  Administered 2016-03-02: 1 mg via INTRAVENOUS
  Filled 2016-03-02: qty 1

## 2016-03-02 MED ORDER — PREDNISONE 20 MG PO TABS: 20.0000 mg | ORAL_TABLET | Freq: Every day | ORAL | Status: DC

## 2016-03-02 MED ORDER — LACTULOSE 10 GM/15ML PO SOLN
30.0000 g | Freq: Once | ORAL | Status: AC
  Administered 2016-03-02: 30 g via ORAL
  Filled 2016-03-02: qty 60

## 2016-03-02 MED ORDER — PREDNISONE 20 MG PO TABS
40.0000 mg | ORAL_TABLET | Freq: Every day | ORAL | Status: AC
  Administered 2016-03-03: 08:00:00 40 mg via ORAL
  Filled 2016-03-02: qty 2

## 2016-03-02 MED ORDER — ACETAMINOPHEN 650 MG RE SUPP: 650.0000 mg | Freq: Four times a day (QID) | RECTAL | Status: DC | PRN

## 2016-03-02 MED ORDER — DIAZEPAM 5 MG PO TABS
10.0000 mg | ORAL_TABLET | Freq: Three times a day (TID) | ORAL | Status: DC
  Administered 2016-03-02 – 2016-03-03 (×3): 10 mg via ORAL
  Filled 2016-03-02 (×3): qty 2

## 2016-03-02 MED ORDER — PREDNISONE 20 MG PO TABS: 30.0000 mg | ORAL_TABLET | Freq: Every day | ORAL | Status: DC

## 2016-03-02 MED ORDER — ENOXAPARIN SODIUM 40 MG/0.4ML ~~LOC~~ SOLN
40.0000 mg | SUBCUTANEOUS | Status: DC
  Administered 2016-03-02: 40 mg via SUBCUTANEOUS
  Filled 2016-03-02: qty 0.4

## 2016-03-02 MED ORDER — AZITHROMYCIN 250 MG PO TABS
500.0000 mg | ORAL_TABLET | Freq: Every day | ORAL | Status: AC
  Administered 2016-03-02: 500 mg via ORAL
  Filled 2016-03-02: qty 2

## 2016-03-02 MED ORDER — QUETIAPINE FUMARATE 200 MG PO TABS
400.0000 mg | ORAL_TABLET | Freq: Every day | ORAL | Status: DC
Start: 2016-03-02 — End: 2016-03-03
  Administered 2016-03-02: 22:00:00 400 mg via ORAL
  Filled 2016-03-02: qty 2

## 2016-03-02 MED ORDER — ONDANSETRON HCL 4 MG/2ML IJ SOLN: 4.0000 mg | Freq: Four times a day (QID) | INTRAMUSCULAR | Status: DC | PRN

## 2016-03-02 MED ORDER — MORPHINE SULFATE (PF) 2 MG/ML IV SOLN
2.0000 mg | INTRAVENOUS | Status: DC | PRN
  Administered 2016-03-02: 2 mg via INTRAVENOUS
  Filled 2016-03-02: qty 1

## 2016-03-02 MED ORDER — PREDNISONE 5 MG PO TABS: 5.0000 mg | ORAL_TABLET | Freq: Every day | ORAL | Status: DC

## 2016-03-02 MED ORDER — MOMETASONE FURO-FORMOTEROL FUM 200-5 MCG/ACT IN AERO
2.0000 | INHALATION_SPRAY | Freq: Two times a day (BID) | RESPIRATORY_TRACT | Status: DC
  Administered 2016-03-02 – 2016-03-03 (×3): 2 via RESPIRATORY_TRACT
  Filled 2016-03-02: qty 8.8

## 2016-03-02 MED ORDER — PREDNISONE 50 MG PO TABS
50.0000 mg | ORAL_TABLET | Freq: Every day | ORAL | Status: AC
  Administered 2016-03-02: 50 mg via ORAL
  Filled 2016-03-02: qty 1
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
## 2016-03-02 NOTE — Progress Notes (Signed)
Pt ambulated twice around unit for evaluation of home O2 status. Pt on room air. Pt's sat decreased to 88% initially; however, increased to 94% after <10 seconds. Sats stayed 94-95% for the remainder of activity while pt was carrying on a conversation during ambulation. Pt completed two laps around unit when pt stated her legs began to hurt and she wanted to get back in the bed.

## 2016-03-02 NOTE — Care Management (Signed)
Patient presents from home with increasing shortness of breath.  Admitted to icu stepdown due to the need for continuous bipap.  Currently on nasal cannula 02. Her previous admissions in Epic are not related to any respiratory issue.  She has a home nebulizer which is over 57 years old through [place].  If she needs home 02 agency preference would be [place].  Patient will also require new home nebulizer machine.  Discussed that would have to meet medicaid criteria for both.   Informed primary nurse of need to perform assess for home 02.  patient is independent in her adls and followed by pulmonology at [facility]

## 2016-03-02 NOTE — ED Notes (Signed)
Per [facility] upon arrival sat's were in the mid 80's. Pt states that she has not felt well x2 weeks. Per [facility], pt has received x2 duonebs and all other VS are WDL. Pt is alert and oriented at this time.
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
## 2016-03-02 NOTE — Progress Notes (Signed)
RT removed patient from [facility] per her request.  Placed on 3LPM [place], O2 sat 95%, no distress noted.  Will continue to monitor. RN Gabriel Cirri aware of change

## 2016-03-02 NOTE — Progress Notes (Signed)
Patient ID: Jamie Schultz, female   DOB: April 30, 1961, 55 y.o.   MRN: [id number] [facility] PROGRESS NOTE  TAQUANNA KINDERKNECHT [id number] DOB: Jan 14, 1961 DOA: 03/02/2016 PCP: Leata Mouse, NP  HPI/Subjective: Patient came in with respiratory distress. She said she woke up and couldn't breathe. She tried taking nebulizers and it didn't work. She's been out of her pain medication for a few days.  Objective: Filed Vitals:   03/02/16 0900 03/02/16 1000  BP: 104/89 121/84  Pulse: 84 88  Temp:    Resp: 20 25    Filed Weights   03/02/16 0224 03/02/16 0800  Weight: 63.504 kg (140 lb) 65.9 kg (145 lb 4.5 oz)    ROS: Review of Systems  Constitutional: Negative for fever and chills.  Eyes: Negative for blurred vision.  Respiratory: Positive for cough and shortness of breath.   Cardiovascular: Negative for chest pain.  Gastrointestinal: Positive for abdominal pain and constipation. Negative for nausea, vomiting and diarrhea.  Genitourinary: Negative for dysuria.  Musculoskeletal: Negative for joint pain.  Neurological: Negative for dizziness and headaches.   Exam: Physical Exam  Constitutional: She is oriented to person, place, and time.  HENT:  Nose: No mucosal edema.  Mouth/Throat: No oropharyngeal exudate or posterior oropharyngeal edema.  Eyes: Conjunctivae, EOM and lids are normal. Pupils are equal, round, and reactive to light.  Neck: No JVD present. Carotid bruit is not present. No edema present. No thyroid mass and no thyromegaly present.  Cardiovascular: S1 normal and S2 normal.  Exam reveals no gallop.   No murmur heard. Pulses:      Dorsalis pedis pulses are 2+ on the right side, and 2+ on the left side.  Respiratory: No respiratory distress. She has decreased breath sounds in the right lower field and the left lower field. She has no wheezes. She has no rhonchi. She has no rales.  GI: Soft. Bowel sounds are normal. There is generalized tenderness.  Musculoskeletal:    Right ankle: She exhibits no swelling.       Left ankle: She exhibits no swelling.  Lymphadenopathy:    She has no cervical adenopathy.  Neurological: She is alert and oriented to person, place, and time. No cranial nerve deficit.  Skin: Skin is warm. No rash noted. Nails show no clubbing.  Psychiatric: She has a normal mood and affect.      Data Reviewed: Basic Metabolic Panel:  Recent Labs Lab 03/02/16 0322  NA 140  K 3.9  CL 108  CO2 25  GLUCOSE 137*  BUN 19  CREATININE 0.92  CALCIUM 9.0   Liver Function Tests:  Recent Labs Lab 03/02/16 0322  AST 25  ALT 28  ALKPHOS 65  BILITOT 0.4  PROT 7.0  ALBUMIN 4.2   CBC:  Recent Labs Lab 03/02/16 0322  WBC 10.4  NEUTROABS 5.9  HGB 13.7  HCT 41.2  MCV 93.8  PLT 250   Cardiac Enzymes:  Recent Labs Lab 03/02/16 0322 03/02/16 0725  TROPONINI <0.03 <0.03   BNP (last 3 results)  Recent Labs  03/02/16 0322  BNP 47.0     CBG:  Recent Labs Lab 03/02/16 0652  GLUCAP 168*     Studies: Dg Chest Portable 1 View  03/02/2016  CLINICAL DATA:  Acute onset shortness of breath.  Initial encounter. EXAM: PORTABLE CHEST 1 VIEW COMPARISON:  Chest radiograph performed 03/23/2013 FINDINGS: The lungs are well-aerated. Pulmonary vascularity is at the upper limits of normal. Mild bibasilar atelectasis is noted. There  is no evidence of pleural effusion or pneumothorax. The cardiomediastinal silhouette is within normal limits. No acute osseous abnormalities are seen. Cervical spinal fusion hardware is noted. IMPRESSION: Mild bibasilar atelectasis noted.  Lungs otherwise clear. Electronically Signed   By: Garald Balding M.D.   On: 03/02/2016 02:55    Scheduled Meds: . albuterol  2.5 mg Nebulization Q4H  . amphetamine-dextroamphetamine  15 mg Oral BID  . antiseptic oral rinse  7 mL Mouth Rinse BID  . [START ON 03/03/2016] azithromycin  250 mg Oral Daily  . diazepam  10 mg Oral Q8H  . docusate sodium  100 mg Oral BID  .  enoxaparin (LOVENOX) injection  40 mg Subcutaneous Q24H  . magnesium oxide  400 mg Oral QHS  . mometasone-formoterol  2 puff Inhalation BID  . multivitamin with minerals  1 tablet Oral Daily  . [START ON 03/03/2016] predniSONE  40 mg Oral Q breakfast   Followed by  . [START ON 03/04/2016] predniSONE  30 mg Oral Q breakfast   Followed by  . [START ON 03/05/2016] predniSONE  20 mg Oral Q breakfast   Followed by  . [START ON 03/06/2016] predniSONE  10 mg Oral Q breakfast   Followed by  . [START ON 03/07/2016] predniSONE  5 mg Oral Q breakfast  . pregabalin  100 mg Oral TID  . QUEtiapine  400 mg Oral QHS  . Suvorexant  1 tablet Oral QHS  . tiotropium  18 mcg Inhalation Daily  . vitamin E  400 Units Oral Daily    Assessment/Plan:  1. Acute respiratory failure with hypoxia. Patient initially required BiPAP. Patient now on room air. Transfer out of the ICU. 2. COPD exacerbation. Continue steroid taper and Zithromax. Continue nebulizers and inhaler. 3. Chronic pain. Patient takes 20 mg of oxycodone 4 times a day. I will decrease that down to 15 mg. She's been in need somebody to prescribe this as outpatient because she will only get a 5 day supply from me. Then she may end up going through withdrawal again. 4. Anxiety. On Valium. Stop Xanax 5. Constipation. Give a dose of lactulose here.  Code Status:     Code Status Orders        Start     Ordered   03/02/16 0705  Full code   Continuous     03/02/16 0704    Code Status History    Date Active Date Inactive Code Status Order ID Comments User Context   09/11/2015  1:08 AM 09/13/2015  2:31 PM Full Code [id number]  Leeroy Cha, MD Inpatient     Family Communication: Husband at the bedside Disposition Plan: Potentially home tomorrow  Antibiotics:  Zithromax  Time spent: 35 minutes  [place], [place]

## 2016-03-02 NOTE — Progress Notes (Addendum)
PT Cancellation Note  Patient Details Name: Jamie Schultz MRN: [id number] DOB: [birth year]   Cancelled Treatment:    Reason Eval/Treat Not Completed: PT screened, no needs identified, will sign off. Chart reviewed, RN consulted. Pt recently has walked the unit twice on RA with nursing with vitals recorded. The pt reports that she is near baseline, breathing much more easily. During our conversation she is noted to perform high level ambulation in room, navigating obstacles, retroambulation, sudden changes in direction, crawling into bed onto four-point, and transitioning to tailor-sitting in bed, all independently and with minimal effort. Pt reports she has an upcoming spine surgery in the next month. She does not feel that she needs any PT services at this time. PT signing off.    3:06 PM, 03/02/2016 Etta Grandchild, PT, DPT Physical Therapist - [place] [phone] (mobile)

## 2016-03-03 LAB — MRSA PCR SCREENING: MRSA by PCR: NEGATIVE

## 2016-03-03 MED ORDER — OXYCODONE HCL 5 MG PO TABS
15.0000 mg | ORAL_TABLET | Freq: Four times a day (QID) | ORAL | Status: DC | PRN
  Administered 2016-03-03: 15 mg via ORAL
  Filled 2016-03-03: qty 3

## 2016-03-03 MED ORDER — IPRATROPIUM-ALBUTEROL 0.5-2.5 (3) MG/3ML IN SOLN: 3.0000 mL | Freq: Four times a day (QID) | RESPIRATORY_TRACT | Status: DC

## 2016-03-03 MED ORDER — OXYCODONE HCL 15 MG PO TABS: 15.0000 mg | ORAL_TABLET | Freq: Four times a day (QID) | ORAL | Status: DC | PRN

## 2016-03-03 MED ORDER — NICOTINE 14 MG/24HR TD PT24: 14.0000 mg | MEDICATED_PATCH | Freq: Every day | TRANSDERMAL | Status: DC

## 2016-03-03 MED ORDER — DIAZEPAM 10 MG PO TABS: 10.0000 mg | ORAL_TABLET | Freq: Three times a day (TID) | ORAL | Status: DC | PRN

## 2016-03-03 MED ORDER — OXYCODONE HCL 5 MG PO TABS: 10.0000 mg | ORAL_TABLET | Freq: Four times a day (QID) | ORAL | Status: DC | PRN

## 2016-03-03 MED ORDER — AZITHROMYCIN 250 MG PO TABS: ORAL_TABLET | ORAL | Status: DC

## 2016-03-03 MED ORDER — PREDNISONE 5 MG PO TABS: ORAL_TABLET | ORAL | Status: DC

## 2016-03-03 MED ORDER — NICOTINE 14 MG/24HR TD PT24
14.0000 mg | MEDICATED_PATCH | Freq: Every day | TRANSDERMAL | Status: DC
  Administered 2016-03-03: 14 mg via TRANSDERMAL
  Filled 2016-03-03: qty 1

## 2016-03-03 MED ORDER — MOMETASONE FURO-FORMOTEROL FUM 200-5 MCG/ACT IN AERO: 2.0000 | INHALATION_SPRAY | Freq: Two times a day (BID) | RESPIRATORY_TRACT | Status: DC

## 2016-03-03 NOTE — Discharge Summary (Signed)
[facility] NAME: Jamie Schultz    MR#:  [id number]  DATE OF BIRTH:  18-Apr-1961  DATE OF ADMISSION:  03/02/2016 ADMITTING PHYSICIAN: Harrie Foreman, MD  DATE OF DISCHARGE: 03/03/2016 11:30 AM  PRIMARY CARE PHYSICIAN: Amy L Krebs, NP    ADMISSION DIAGNOSIS:  COPD exacerbation (HCC) [J44.1]  DISCHARGE DIAGNOSIS:  Active Problems:   Acute on chronic respiratory failure with hypercapnia ([place])   SECONDARY DIAGNOSIS:   Past Medical History  Diagnosis Date  . Chronic back pain   . COPD (chronic obstructive pulmonary disease) ([place])   . Anxiety   . Panic attack   . Allergy   . Abscess   . Blood transfusion without reported diagnosis   . Congenital prolapsed rectum   . Numbness and tingling of foot     Right  . Arthritis   . Fibromyalgia   . Chronic kidney disease     history of kidney stone  . Kidney failure     acute - reaction to sulfa drugs  . Hepatitis C   . Wears dentures     full upper and lower  . Numbness   . Muscle spasm   . Pneumonia   . Spinal stenosis     cervical region  . MRSA (methicillin resistant staph aureus) culture positive     Hx: of  . Chronic bronchiolitis ([place])     HOSPITAL COURSE:   1. Acute respiratory failure with hypoxia. Patient initially required BiPAP. Lungs are clear and patient is breathing comfortably on room air. Quick prednisone taper. I wrote a prescription for nebulizer and DuoNeb nebulizer solution. Patient does not want to use Spiriva. Patient has Combivent at home. [facility] prescription. 2. COPD exacerbation. Quick prednisone taper and Zithromax 3. Chronic pain. Patient states that she takes 20 mg of oxycodone 4 times a day. I decreased that down to 15 mg 4 times a day. Patient has a pain management appointment on the 16th. She states that if I write 8 days of pain medication for her she can stretch it out the 12 days. Prescription written for 8 days. She will need the pain  management clinic to get her on a pain regimen and stick with it. 4. Chronic anxiety. Valium written for 8 days. Stop Xanax. 5. Constipation. Patient did have bowel movements. With her rectal prolapse she does see blood. Can follow up with GI as outpatient.  DISCHARGE CONDITIONS:   Fair  CONSULTS OBTAINED:  None  DRUG ALLERGIES:   Allergies  Allergen Reactions  . Bee Venom Anaphylaxis    Bees/wasps/yellow jackets  . Keflex [Cephalexin] Anaphylaxis  . Penicillins Anaphylaxis    Has patient had a PCN reaction causing immediate rash, facial/tongue/throat swelling, SOB or lightheadedness with hypotension: Yes Has patient had a PCN reaction causing severe rash involving mucus membranes or skin necrosis: No Has patient had a PCN reaction that required hospitalization Yes, in the hospital already Has patient had a PCN reaction occurring within the last 10 years: Yes If all of the above answers are "NO", then may proceed with Cephalosporin use.   . Sulfa Antibiotics Other (See Comments)    Renal failure  . Hydrocodone Rash and Other (See Comments)    "I couldn't get my breath"  . Codeine Itching and Rash  . Cyclobenzaprine Other (See Comments)    Severe constipation  . Methadone Other (See Comments)    Change in mental status  . Neurontin [Gabapentin]  Hives  . Tape Itching and Rash    Blisters skin, Please use "paper" tape  . Tegretol [Carbamazepine] Hives and Rash  . Toradol [Ketorolac Tromethamine] Hives and Rash  . Tramadol Hives and Rash  . Tylenol [Acetaminophen] Rash    DISCHARGE MEDICATIONS:   Discharge Medication List as of 03/03/2016 10:36 AM    START taking these medications   Details  azithromycin (ZITHROMAX) 250 MG tablet Take one tab daily for three days, Print    ipratropium-albuterol (DUONEB) 0.5-2.5 (3) MG/3ML SOLN Take 3 mLs by nebulization every 6 (six) hours., Starting 03/03/2016, Until Discontinued, Print    mometasone-formoterol (DULERA) 200-5 MCG/ACT  AERO Inhale 2 puffs into the lungs 2 (two) times daily., Starting 03/03/2016, Until Discontinued, Print    nicotine (NICODERM CQ - DOSED IN MG/24 HOURS) 14 mg/24hr patch Place 1 patch (14 mg total) onto the skin daily., Starting 03/03/2016, Until Discontinued, Print    oxyCODONE (ROXICODONE) 15 MG immediate release tablet Take 1 tablet (15 mg total) by mouth every 6 (six) hours as needed for moderate pain or severe pain., Starting 03/03/2016, Until Discontinued, Print    predniSONE (DELTASONE) 5 MG tablet Take 4 po tabs day1; take 3 tabs po day2; take 2 tabs day3; take 1 tab day4,5, Print      CONTINUE these medications which have CHANGED   Details  diazepam (VALIUM) 10 MG tablet Take 1 tablet (10 mg total) by mouth 3 (three) times daily as needed for anxiety., Starting 03/03/2016, Until Discontinued, Print      CONTINUE these medications which have NOT CHANGED   Details  amphetamine-dextroamphetamine (ADDERALL) 15 MG tablet Take 1 tablet by mouth 2 (two) times daily., Starting 02/26/2016, Until Discontinued, Historical Med    BELSOMRA 20 MG TABS TAKE 1 TABLET BY MOUTH AT BEDTIME AS DIRECTED, Historical Med    COMBIVENT RESPIMAT 20-100 MCG/ACT AERS respimat INHALE 2 PUFFS 4 TIMES A DAY, Normal    Cyanocobalamin (VITAMIN B 12 PO) Take 1 tablet by mouth daily., Until Discontinued, Historical Med    EPIPEN 2-PAK 0.3 MG/0.3ML SOAJ injection USE AS DIRECTED FOR ANAPYLACTIC REACTION (TO BEE STINGS), Historical Med    LYRICA 100 MG capsule Take 1 capsule (100 mg total) by mouth 3 (three) times daily., Starting 06/03/2015, Until Discontinued, Print    Magnesium 250 MG TABS Take 1 tablet (250 mg total) by mouth at bedtime., Starting 01/22/2016, Until Discontinued, Normal    Multiple Vitamin (MULITIVITAMIN WITH MINERALS) TABS Take 1 tablet by mouth daily., Until Discontinued, Historical Med    NASONEX 50 MCG/ACT nasal spray PLACE 2 SPRAYS EACH NOSTRIL EVERY DAY, Normal    promethazine (PHENERGAN) 25 MG  tablet Take 1 tablet (25 mg total) by mouth every 8 (eight) hours as needed for nausea or vomiting., Starting 06/14/2015, Until Discontinued, Normal    QUEtiapine (SEROQUEL) 400 MG tablet TAKE 1 AND A HALF TABLETS BY MOUTH EVERY DAY AT BEDTIME, Historical Med    vitamin E (VITAMIN E) 400 UNIT capsule Take 400 Units by mouth daily., Until Discontinued, Historical Med      STOP taking these medications     albuterol (PROVENTIL) (2.5 MG/3ML) 0.083% nebulizer solution      alprazolam (XANAX) 2 MG tablet          DISCHARGE INSTRUCTIONS:   Follow-up PMD one week  If you experience worsening of your admission symptoms, develop shortness of breath, life threatening emergency, suicidal or homicidal thoughts you must seek medical attention immediately by calling 911  or calling your MD immediately  if symptoms less severe.  You Must read complete instructions/literature along with all the possible adverse reactions/side effects for all the Medicines you take and that have been prescribed to you. Take any new Medicines after you have completely understood and accept all the possible adverse reactions/side effects.   Please note  You were cared for by a hospitalist during your hospital stay. If you have any questions about your discharge medications or the care you received while you were in the hospital after you are discharged, you can call the unit and asked to speak with the hospitalist on call if the hospitalist that took care of you is not available. Once you are discharged, your primary care physician will handle any further medical issues. Please note that NO REFILLS for any discharge medications will be authorized once you are discharged, as it is imperative that you return to your primary care physician (or establish a relationship with a primary care physician if you do not have one) for your aftercare needs so that they can reassess your need for medications and monitor your lab  values.    Today   CHIEF COMPLAINT:   Chief Complaint  Patient presents with  . Shortness of Breath    HISTORY OF PRESENT ILLNESS:  Jamie Schultz  is a 55 y.o. female with a known history of COPD presented with shortness of breath respiratory distress requiring BiPAP on presentation   VITAL SIGNS:  Blood pressure 149/80, pulse 75, temperature 98.4 F (36.9 C), temperature source Oral, resp. rate 20, height 5' (1.524 m), weight 66.679 kg (147 lb), SpO2 95 %.  I/O:   Intake/Output Summary (Last 24 hours) at 03/03/16 1425 Last data filed at 03/03/16 0900  Gross per 24 hour  Intake    240 ml  Output      0 ml  Net    240 ml    PHYSICAL EXAMINATION:  GENERAL:  55 y.o.-year-old patient lying in the bed with no acute distress.  EYES: Pupils equal, round, reactive to light and accommodation. No scleral icterus. Extraocular muscles intact.  HEENT: Head atraumatic, normocephalic. Oropharynx and nasopharynx clear.  NECK:  Supple, no jugular venous distention. No thyroid enlargement, no tenderness.  LUNGS: Normal breath sounds bilaterally, no wheezing, rales,rhonchi or crepitation. No use of accessory muscles of respiration.  CARDIOVASCULAR: S1, S2 normal. No murmurs, rubs, or gallops.  ABDOMEN: Soft, non-tender, non-distended. Bowel sounds present. No organomegaly or mass.  EXTREMITIES: No pedal edema, cyanosis, or clubbing.  NEUROLOGIC: Cranial nerves II through XII are intact. Muscle strength 5/5 in all extremities. Sensation intact. Gait not checked.  PSYCHIATRIC: The patient is alert and oriented x 3.  SKIN: No obvious rash, lesion, or ulcer.   DATA REVIEW:   CBC  Recent Labs Lab 03/02/16 0322  WBC 10.4  HGB 13.7  HCT 41.2  PLT 250    Chemistries   Recent Labs Lab 03/02/16 0322  NA 140  K 3.9  CL 108  CO2 25  GLUCOSE 137*  BUN 19  CREATININE 0.92  CALCIUM 9.0  AST 25  ALT 28  ALKPHOS 65  BILITOT 0.4    Cardiac Enzymes  Recent Labs Lab  03/02/16 1924  TROPONINI <0.03    Microbiology Results  Results for orders placed or performed during the hospital encounter of 03/02/16  MRSA PCR Screening     Status: None   Collection Time: 03/02/16  7:00 AM  Result Value Ref Range Status  MRSA by PCR NEGATIVE NEGATIVE Final    Comment:        The GeneXpert MRSA Assay (FDA approved for NASAL specimens only), is one component of a comprehensive MRSA colonization surveillance program. It is not intended to diagnose MRSA infection nor to guide or monitor treatment for MRSA infections.     RADIOLOGY:  Dg Chest Portable 1 View  03/02/2016  CLINICAL DATA:  Acute onset shortness of breath.  Initial encounter. EXAM: PORTABLE CHEST 1 VIEW COMPARISON:  Chest radiograph performed 03/23/2013 FINDINGS: The lungs are well-aerated. Pulmonary vascularity is at the upper limits of normal. Mild bibasilar atelectasis is noted. There is no evidence of pleural effusion or pneumothorax. The cardiomediastinal silhouette is within normal limits. No acute osseous abnormalities are seen. Cervical spinal fusion hardware is noted. IMPRESSION: Mild bibasilar atelectasis noted.  Lungs otherwise clear. Electronically Signed   By: Garald Balding M.D.   On: 03/02/2016 02:55   Management plans discussed with the patient, family and they are in agreement.  CODE STATUS:     Code Status Orders        Start     Ordered   03/02/16 0705  Full code   Continuous     03/02/16 0704    Code Status History    Date Active Date Inactive Code Status Order ID Comments User Context   09/11/2015  1:08 AM 09/13/2015  2:31 PM Full Code [id number]  Leeroy Cha, MD Inpatient      TOTAL TIME TAKING CARE OF THIS PATIENT: 40 minutes.    Loletha Grayer M.D on 03/03/2016 at 2:25 PM  Between 7am to 6pm - Pager - [phone]  After 6pm go to [email or web address] - password [email or web address]  [facility] Office  [phone]  CC: Primary care physician; Amy Annamary Rummage,  NP

## 2016-03-03 NOTE — Progress Notes (Signed)
Pt d/ced home.  COPD exacerbation resolved.  Pt is 95% on RA.  Pt committed to quitting smoking.  Going home w/nicotine patch script.  Also getting nebulizer machine and on prednisone taper.  Pt has chronic back pain from spinal stenosis.  Relieved w/pain medication. Troponins WNL.  D/c Instructions reviewed w/patient.  Leaving w/husband.

## 2016-03-03 NOTE — Care Management (Signed)
Discharge to home today per Dr. Leslye Peer. Will need home nebulizer. Spoke with Ms. Kueck at the bedside. Discussed durable medical equipment agencies. [place]. Telephone call to [facility]. Husband will transport. Shelbie Ammons RN MSN CCM Care Management [phone]

## 2016-03-04 ENCOUNTER — Other Ambulatory Visit: Payer: Self-pay | Admitting: Family Medicine

## 2016-03-17 ENCOUNTER — Other Ambulatory Visit: Payer: Self-pay | Admitting: Neurosurgery

## 2016-03-19 NOTE — Pre-Procedure Instructions (Signed)
    TOMEEKA BORTZ  03/19/2016    Your procedure is scheduled on Tuesday, July 25.  Report to [facility] Admitting at 6:00  A.M.                Your surgery or procedure is scheduled for 9:00 AM   Call this number if you have problems the morning of [phone]                For any other questions, please call [phone], Monday - Friday 8 AM - 4 PM.   Remember:  Do not eat food or drink liquids after midnight Monday, July 24.  Take these medicines the morning of surgery with A SIP of Water:-  May use Inhalers and Nasal Sprays.              Take if neeed and you tolerate it on an empty stomach: oxyCODONE (ROXICODONE), promethazine (PHENERGAN).               Do not take any NSAIDs ie: Ibuprofen,  Advil,Naproxen or any medication containing Aspirin, Vitamins, Herbal Medications.               Do not wear jewelry, make-up or nail polish.  Do not wear lotions, powders, or perfumes.   Do not shave 48 hours prior to surgery.  Do not bring valuables to the hospital.  [facility] is not responsible for any belongings or valuables.  Contacts, dentures or bridgework may not be worn into surgery.  Leave your suitcase in the car.  After surgery it may be brought to your room.  For patients admitted to the hospital, discharge time will be determined by your treatment team.   Special instructions:  Review  [facility] - Preparing For Surgery.  Please read over the following fact sheets that you were given. Coughing and Deep Breathing and MRSA Information, Coughing and Deep Breathing

## 2016-03-20 ENCOUNTER — Encounter (HOSPITAL_COMMUNITY): Payer: Self-pay

## 2016-03-20 ENCOUNTER — Encounter (HOSPITAL_COMMUNITY)
Admission: RE | Admit: 2016-03-20 | Discharge: 2016-03-20 | Disposition: A | Discharge status: Home / Self Care | Payer: Medicaid Other | Source: Ambulatory Visit | Attending: Neurosurgery | Admitting: Neurosurgery

## 2016-03-20 ENCOUNTER — Other Ambulatory Visit (HOSPITAL_COMMUNITY): Payer: Medicaid Other

## 2016-03-20 DIAGNOSIS — M4316 Spondylolisthesis, lumbar region: Secondary | ICD-10-CM | POA: Insufficient documentation

## 2016-03-20 DIAGNOSIS — Z01812 Encounter for preprocedural laboratory examination: Secondary | ICD-10-CM | POA: Diagnosis not present

## 2016-03-20 DIAGNOSIS — Z0183 Encounter for blood typing: Secondary | ICD-10-CM | POA: Diagnosis not present

## 2016-03-20 HISTORY — DX: Personal history of urinary calculi: Z87.442

## 2016-03-20 LAB — BASIC METABOLIC PANEL
ANION GAP: 8 (ref 5–15)
BUN: 10 mg/dL (ref 6–20)
CHLORIDE: 105 mmol/L (ref 101–111)
CO2: 25 mmol/L (ref 22–32)
CREATININE: 0.93 mg/dL (ref 0.44–1.00)
Calcium: 9.6 mg/dL (ref 8.9–10.3)
GFR calc non Af Amer: >60 mL/min (ref >60)
GLUCOSE: 98 mg/dL (ref 65–99)
Potassium: 4.5 mmol/L (ref 3.5–5.1)
Sodium: 138 mmol/L (ref 135–145)

## 2016-03-20 LAB — CBC
HEMATOCRIT: 48.5 % — AB (ref 36.0–46.0)
HEMOGLOBIN: 16.2 g/dL — AB (ref 12.0–15.0)
MCH: 31.2 pg (ref 26.0–34.0)
MCHC: 33.4 g/dL (ref 30.0–36.0)
MCV: 93.4 fL (ref 78.0–100.0)
Platelets: 383 10*3/uL (ref 150–400)
RBC: 5.19 MIL/uL — ABNORMAL HIGH (ref 3.87–5.11)
RDW: 14.9 % (ref 11.5–15.5)
WBC: 11 10*3/uL — ABNORMAL HIGH (ref 4.0–10.5)

## 2016-03-20 LAB — TYPE AND SCREEN
ABO/RH(D): O POS
Antibody Screen: NEGATIVE

## 2016-03-20 LAB — SURGICAL PCR SCREEN
MRSA, PCR: NEGATIVE
STAPHYLOCOCCUS AUREUS: NEGATIVE

## 2016-03-20 LAB — ABO/RH: ABO/RH(D): O POS

## 2016-03-23 MED ORDER — VANCOMYCIN HCL IN DEXTROSE 1-5 GM/200ML-% IV SOLN
1000.0000 mg | INTRAVENOUS | Status: AC
  Administered 2016-03-24: 1000 mg via INTRAVENOUS
  Filled 2016-03-23: qty 200
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
## 2016-03-24 ENCOUNTER — Encounter (HOSPITAL_COMMUNITY)
Admission: RE | Disposition: A | Discharge status: Home / Self Care | Payer: Self-pay | Source: Ambulatory Visit | Attending: Neurosurgery

## 2016-03-24 ENCOUNTER — Inpatient Hospital Stay (HOSPITAL_COMMUNITY): Payer: Medicaid Other

## 2016-03-24 ENCOUNTER — Inpatient Hospital Stay (HOSPITAL_COMMUNITY): Payer: Medicaid Other | Admitting: Certified Registered"

## 2016-03-24 ENCOUNTER — Inpatient Hospital Stay (HOSPITAL_COMMUNITY)
Admission: RE | Admit: 2016-03-24 | Discharge: 2016-03-26 | DRG: 460 | Disposition: A | Discharge status: Home / Self Care | Payer: Medicaid Other | Source: Ambulatory Visit | Attending: Neurosurgery | Admitting: Neurosurgery

## 2016-03-24 ENCOUNTER — Encounter (HOSPITAL_COMMUNITY): Payer: Self-pay | Admitting: *Deleted

## 2016-03-24 DIAGNOSIS — Z96651 Presence of right artificial knee joint: Secondary | ICD-10-CM | POA: Diagnosis present

## 2016-03-24 DIAGNOSIS — F17210 Nicotine dependence, cigarettes, uncomplicated: Secondary | ICD-10-CM | POA: Diagnosis present

## 2016-03-24 DIAGNOSIS — M4805 Spinal stenosis, thoracolumbar region: Secondary | ICD-10-CM | POA: Diagnosis present

## 2016-03-24 DIAGNOSIS — Z419 Encounter for procedure for purposes other than remedying health state, unspecified: Secondary | ICD-10-CM

## 2016-03-24 DIAGNOSIS — Z882 Allergy status to sulfonamides status: Secondary | ICD-10-CM | POA: Diagnosis not present

## 2016-03-24 DIAGNOSIS — Z881 Allergy status to other antibiotic agents status: Secondary | ICD-10-CM

## 2016-03-24 DIAGNOSIS — B1920 Unspecified viral hepatitis C without hepatic coma: Secondary | ICD-10-CM | POA: Diagnosis present

## 2016-03-24 DIAGNOSIS — M4316 Spondylolisthesis, lumbar region: Principal | ICD-10-CM | POA: Diagnosis present

## 2016-03-24 DIAGNOSIS — Z886 Allergy status to analgesic agent status: Secondary | ICD-10-CM | POA: Diagnosis not present

## 2016-03-24 DIAGNOSIS — Z880 Allergy status to penicillin: Secondary | ICD-10-CM

## 2016-03-24 DIAGNOSIS — Z888 Allergy status to other drugs, medicaments and biological substances status: Secondary | ICD-10-CM | POA: Diagnosis not present

## 2016-03-24 DIAGNOSIS — M5416 Radiculopathy, lumbar region: Secondary | ICD-10-CM | POA: Diagnosis present

## 2016-03-24 DIAGNOSIS — Z90710 Acquired absence of both cervix and uterus: Secondary | ICD-10-CM

## 2016-03-24 DIAGNOSIS — N189 Chronic kidney disease, unspecified: Secondary | ICD-10-CM | POA: Diagnosis present

## 2016-03-24 DIAGNOSIS — Z885 Allergy status to narcotic agent status: Secondary | ICD-10-CM | POA: Diagnosis not present

## 2016-03-24 DIAGNOSIS — Z91030 Bee allergy status: Secondary | ICD-10-CM

## 2016-03-24 DIAGNOSIS — Z981 Arthrodesis status: Secondary | ICD-10-CM

## 2016-03-24 SURGERY — POSTERIOR LUMBAR FUSION 1 LEVEL
Anesthesia: General | Site: Spine Lumbar

## 2016-03-24 MED ORDER — VECURONIUM BROMIDE 10 MG IV SOLR
INTRAVENOUS | Status: AC
  Filled 2016-03-24: qty 10

## 2016-03-24 MED ORDER — MEPERIDINE HCL 25 MG/ML IJ SOLN: 6.2500 mg | INTRAMUSCULAR | Status: DC | PRN

## 2016-03-24 MED ORDER — ONDANSETRON HCL 4 MG/2ML IJ SOLN
4.0000 mg | INTRAMUSCULAR | Status: DC | PRN
  Administered 2016-03-24 (×2): 4 mg via INTRAVENOUS
  Filled 2016-03-24 (×2): qty 2

## 2016-03-24 MED ORDER — THROMBIN 5000 UNITS EX SOLR
OROMUCOSAL | Status: DC | PRN
  Administered 2016-03-24: 5 mL via TOPICAL

## 2016-03-24 MED ORDER — OXYCODONE HCL 5 MG PO TABS
20.0000 mg | ORAL_TABLET | ORAL | Status: DC | PRN
  Administered 2016-03-24 – 2016-03-25 (×3): 20 mg via ORAL
  Filled 2016-03-24 (×3): qty 4

## 2016-03-24 MED ORDER — THROMBIN 20000 UNITS EX SOLR
CUTANEOUS | Status: DC | PRN
  Administered 2016-03-24: 20 mL via TOPICAL

## 2016-03-24 MED ORDER — FENTANYL CITRATE (PF) 250 MCG/5ML IJ SOLN
INTRAMUSCULAR | Status: DC | PRN
  Administered 2016-03-24 (×2): 100 ug via INTRAVENOUS
  Administered 2016-03-24: 150 ug via INTRAVENOUS
  Administered 2016-03-24: 50 ug via INTRAVENOUS
  Administered 2016-03-24: 100 ug via INTRAVENOUS

## 2016-03-24 MED ORDER — MIDAZOLAM HCL 5 MG/5ML IJ SOLN
INTRAMUSCULAR | Status: DC | PRN
  Administered 2016-03-24: 2 mg via INTRAVENOUS

## 2016-03-24 MED ORDER — FENTANYL CITRATE (PF) 250 MCG/5ML IJ SOLN
INTRAMUSCULAR | Status: AC
  Filled 2016-03-24: qty 5

## 2016-03-24 MED ORDER — PROMETHAZINE HCL 25 MG/ML IJ SOLN: 6.2500 mg | INTRAMUSCULAR | Status: DC | PRN

## 2016-03-24 MED ORDER — VANCOMYCIN HCL IN DEXTROSE 750-5 MG/150ML-% IV SOLN
750.0000 mg | Freq: Two times a day (BID) | INTRAVENOUS | Status: DC
  Administered 2016-03-24 – 2016-03-25 (×2): 750 mg via INTRAVENOUS
  Filled 2016-03-24 (×3): qty 150

## 2016-03-24 MED ORDER — VANCOMYCIN HCL 1000 MG IV SOLR
INTRAVENOUS | Status: AC
  Filled 2016-03-24: qty 1000

## 2016-03-24 MED ORDER — LIDOCAINE HCL (CARDIAC) 20 MG/ML IV SOLN
INTRAVENOUS | Status: DC | PRN
  Administered 2016-03-24: 60 mg via INTRAVENOUS

## 2016-03-24 MED ORDER — BUPIVACAINE LIPOSOME 1.3 % IJ SUSP
20.0000 mL | INTRAMUSCULAR | Status: AC
  Filled 2016-03-24: qty 20

## 2016-03-24 MED ORDER — CHLORHEXIDINE GLUCONATE CLOTH 2 % EX PADS: 6.0000 | MEDICATED_PAD | Freq: Once | CUTANEOUS | Status: DC

## 2016-03-24 MED ORDER — ROCURONIUM BROMIDE 50 MG/5ML IV SOLN
INTRAVENOUS | Status: AC
  Filled 2016-03-24: qty 1

## 2016-03-24 MED ORDER — LACTATED RINGERS IV SOLN
INTRAVENOUS | Status: DC
  Administered 2016-03-24 (×2): via INTRAVENOUS

## 2016-03-24 MED ORDER — DIAZEPAM 5 MG PO TABS
ORAL_TABLET | ORAL | Status: AC
  Filled 2016-03-24: qty 2

## 2016-03-24 MED ORDER — FENTANYL CITRATE (PF) 100 MCG/2ML IJ SOLN
25.0000 ug | Freq: Once | INTRAMUSCULAR | Status: AC
  Administered 2016-03-24: 25 ug via INTRAVENOUS

## 2016-03-24 MED ORDER — BUPIVACAINE LIPOSOME 1.3 % IJ SUSP
INTRAMUSCULAR | Status: DC | PRN
  Administered 2016-03-24: 20 mL

## 2016-03-24 MED ORDER — ALBUTEROL SULFATE HFA 108 (90 BASE) MCG/ACT IN AERS
INHALATION_SPRAY | RESPIRATORY_TRACT | Status: DC | PRN
  Administered 2016-03-24: 4 via RESPIRATORY_TRACT

## 2016-03-24 MED ORDER — HYDROMORPHONE HCL 1 MG/ML IJ SOLN
0.2500 mg | INTRAMUSCULAR | Status: DC | PRN
  Administered 2016-03-24 (×4): 0.5 mg via INTRAVENOUS

## 2016-03-24 MED ORDER — DIPHENHYDRAMINE HCL 50 MG/ML IJ SOLN: 12.5000 mg | Freq: Four times a day (QID) | INTRAMUSCULAR | Status: DC | PRN

## 2016-03-24 MED ORDER — DIAZEPAM 5 MG PO TABS
10.0000 mg | ORAL_TABLET | Freq: Three times a day (TID) | ORAL | Status: DC | PRN
  Administered 2016-03-24 – 2016-03-26 (×6): 10 mg via ORAL
  Filled 2016-03-24 (×5): qty 2

## 2016-03-24 MED ORDER — MIDAZOLAM HCL 2 MG/2ML IJ SOLN
INTRAMUSCULAR | Status: AC
  Filled 2016-03-24: qty 2

## 2016-03-24 MED ORDER — SUGAMMADEX SODIUM 200 MG/2ML IV SOLN
INTRAVENOUS | Status: DC | PRN
  Administered 2016-03-24: 150 mg via INTRAVENOUS

## 2016-03-24 MED ORDER — EPHEDRINE 5 MG/ML INJ
INTRAVENOUS | Status: AC
  Filled 2016-03-24: qty 10

## 2016-03-24 MED ORDER — SODIUM CHLORIDE 0.9% FLUSH: 9.0000 mL | INTRAVENOUS | Status: DC | PRN

## 2016-03-24 MED ORDER — FENTANYL 40 MCG/ML IV SOLN
INTRAVENOUS | Status: AC
  Filled 2016-03-24: qty 25

## 2016-03-24 MED ORDER — SODIUM CHLORIDE 0.9% FLUSH
3.0000 mL | Freq: Two times a day (BID) | INTRAVENOUS | Status: DC
  Administered 2016-03-24 – 2016-03-25 (×2): 3 mL via INTRAVENOUS

## 2016-03-24 MED ORDER — QUETIAPINE FUMARATE 200 MG PO TABS
800.0000 mg | ORAL_TABLET | Freq: Every day | ORAL | Status: DC
  Administered 2016-03-24 – 2016-03-25 (×2): 800 mg via ORAL
  Filled 2016-03-24: qty 2
  Filled 2016-03-24 (×2): qty 4

## 2016-03-24 MED ORDER — PROMETHAZINE HCL 25 MG/ML IJ SOLN
12.5000 mg | Freq: Four times a day (QID) | INTRAMUSCULAR | Status: DC | PRN
  Administered 2016-03-24 – 2016-03-25 (×3): 12.5 mg via INTRAVENOUS
  Filled 2016-03-24 (×3): qty 1

## 2016-03-24 MED ORDER — PROPOFOL 10 MG/ML IV BOLUS
INTRAVENOUS | Status: DC | PRN
  Administered 2016-03-24: 120 mg via INTRAVENOUS
  Administered 2016-03-24: 30 mg via INTRAVENOUS

## 2016-03-24 MED ORDER — HYDROMORPHONE HCL 1 MG/ML IJ SOLN
INTRAMUSCULAR | Status: AC
  Filled 2016-03-24: qty 2

## 2016-03-24 MED ORDER — EPHEDRINE SULFATE 50 MG/ML IJ SOLN
INTRAMUSCULAR | Status: DC | PRN
  Administered 2016-03-24 (×3): 10 mg via INTRAVENOUS

## 2016-03-24 MED ORDER — MOMETASONE FURO-FORMOTEROL FUM 200-5 MCG/ACT IN AERO
2.0000 | INHALATION_SPRAY | Freq: Two times a day (BID) | RESPIRATORY_TRACT | Status: DC
  Administered 2016-03-24 – 2016-03-26 (×4): 2 via RESPIRATORY_TRACT
  Filled 2016-03-24 (×2): qty 8.8

## 2016-03-24 MED ORDER — AMPHETAMINE-DEXTROAMPHETAMINE 10 MG PO TABS
15.0000 mg | ORAL_TABLET | Freq: Two times a day (BID) | ORAL | Status: DC
  Administered 2016-03-24 – 2016-03-26 (×3): 15 mg via ORAL
  Filled 2016-03-24 (×4): qty 2

## 2016-03-24 MED ORDER — OXYCODONE-ACETAMINOPHEN 5-325 MG PO TABS
1.0000 | ORAL_TABLET | ORAL | Status: DC | PRN
  Filled 2016-03-24: qty 2

## 2016-03-24 MED ORDER — DEXAMETHASONE SODIUM PHOSPHATE 4 MG/ML IJ SOLN
INTRAMUSCULAR | Status: DC | PRN
  Administered 2016-03-24: 10 mg via INTRAVENOUS

## 2016-03-24 MED ORDER — NALOXONE HCL 0.4 MG/ML IJ SOLN: 0.4000 mg | INTRAMUSCULAR | Status: DC | PRN

## 2016-03-24 MED ORDER — PHENYLEPHRINE 40 MCG/ML (10ML) SYRINGE FOR IV PUSH (FOR BLOOD PRESSURE SUPPORT)
PREFILLED_SYRINGE | INTRAVENOUS | Status: AC
Start: 2016-03-24 — End: 2016-03-24
  Filled 2016-03-24: qty 10

## 2016-03-24 MED ORDER — 0.9 % SODIUM CHLORIDE (POUR BTL) OPTIME
TOPICAL | Status: DC | PRN
  Administered 2016-03-24: 1000 mL

## 2016-03-24 MED ORDER — LACTATED RINGERS IV SOLN: INTRAVENOUS | Status: DC

## 2016-03-24 MED ORDER — PHENOL 1.4 % MT LIQD: 1.0000 | OROMUCOSAL | Status: DC | PRN

## 2016-03-24 MED ORDER — DIPHENHYDRAMINE HCL 12.5 MG/5ML PO ELIX
12.5000 mg | ORAL_SOLUTION | Freq: Four times a day (QID) | ORAL | Status: DC | PRN
  Administered 2016-03-24: 12.5 mg via ORAL
  Filled 2016-03-24: qty 10

## 2016-03-24 MED ORDER — ONDANSETRON HCL 4 MG/2ML IJ SOLN
INTRAMUSCULAR | Status: DC | PRN
  Administered 2016-03-24: 4 mg via INTRAVENOUS

## 2016-03-24 MED ORDER — SODIUM CHLORIDE 0.9 % IV SOLN: 250.0000 mL | INTRAVENOUS | Status: DC

## 2016-03-24 MED ORDER — PREGABALIN 25 MG PO CAPS
100.0000 mg | ORAL_CAPSULE | Freq: Three times a day (TID) | ORAL | Status: DC
  Administered 2016-03-24 – 2016-03-26 (×5): 100 mg via ORAL
  Filled 2016-03-24 (×6): qty 4

## 2016-03-24 MED ORDER — VECURONIUM BROMIDE 10 MG IV SOLR
INTRAVENOUS | Status: DC | PRN
  Administered 2016-03-24: 1 mg via INTRAVENOUS
  Administered 2016-03-24: 2 mg via INTRAVENOUS

## 2016-03-24 MED ORDER — VANCOMYCIN HCL 1000 MG IV SOLR
INTRAVENOUS | Status: DC | PRN
  Administered 2016-03-24: 1000 mg via TOPICAL

## 2016-03-24 MED ORDER — SODIUM CHLORIDE 0.9% FLUSH: 3.0000 mL | INTRAVENOUS | Status: DC | PRN

## 2016-03-24 MED ORDER — MENTHOL 3 MG MT LOZG
1.0000 | LOZENGE | OROMUCOSAL | Status: DC | PRN
Start: 2016-03-24 — End: 2016-03-26

## 2016-03-24 MED ORDER — ROCURONIUM BROMIDE 100 MG/10ML IV SOLN
INTRAVENOUS | Status: DC | PRN
  Administered 2016-03-24: 50 mg via INTRAVENOUS

## 2016-03-24 MED ORDER — SUCCINYLCHOLINE CHLORIDE 200 MG/10ML IV SOSY
PREFILLED_SYRINGE | INTRAVENOUS | Status: AC
  Filled 2016-03-24: qty 10

## 2016-03-24 MED ORDER — FENTANYL CITRATE (PF) 100 MCG/2ML IJ SOLN
INTRAMUSCULAR | Status: AC
  Filled 2016-03-24: qty 2

## 2016-03-24 MED ORDER — PROPOFOL 10 MG/ML IV BOLUS
INTRAVENOUS | Status: AC
  Filled 2016-03-24: qty 20

## 2016-03-24 MED ORDER — ONDANSETRON HCL 4 MG/2ML IJ SOLN: 4.0000 mg | Freq: Four times a day (QID) | INTRAMUSCULAR | Status: DC | PRN

## 2016-03-24 MED ORDER — ALBUMIN HUMAN 5 % IV SOLN
INTRAVENOUS | Status: DC | PRN
  Administered 2016-03-24: 10:00:00 via INTRAVENOUS

## 2016-03-24 MED ORDER — FENTANYL 40 MCG/ML IV SOLN
INTRAVENOUS | Status: DC
  Administered 2016-03-24: 12:00:00 via INTRAVENOUS

## 2016-03-24 MED ORDER — LIDOCAINE 2% (20 MG/ML) 5 ML SYRINGE
INTRAMUSCULAR | Status: AC
  Filled 2016-03-24: qty 5

## 2016-03-24 MED ORDER — PHENYLEPHRINE HCL 10 MG/ML IJ SOLN
INTRAMUSCULAR | Status: DC | PRN
  Administered 2016-03-24: 120 ug via INTRAVENOUS
  Administered 2016-03-24: 40 ug via INTRAVENOUS
  Administered 2016-03-24 (×2): 120 ug via INTRAVENOUS

## 2016-03-24 MED FILL — Heparin Sodium (Porcine) Inj 1000 Unit/ML: INTRAMUSCULAR | Qty: 30 | Status: AC

## 2016-03-24 MED FILL — Sodium Chloride IV Soln 0.9%: INTRAVENOUS | Qty: 1000 | Status: AC

## 2016-03-24 SURGICAL SUPPLY — 73 items
APL SKNCLS STERI-STRIP NONHPOA (GAUZE/BANDAGES/DRESSINGS) ×1
BENZOIN TINCTURE PRP APPL 2/3 (GAUZE/BANDAGES/DRESSINGS) ×3 IMPLANT
BLADE CLIPPER SURG (BLADE) IMPLANT
BUR ACORN 6.0 (BURR) ×2 IMPLANT
BUR ACORN 6.0MM (BURR) ×1
BUR MATCHSTICK NEURO 3.0 LAGG (BURR) ×3 IMPLANT
CANISTER SUCT 3000ML PPV (MISCELLANEOUS) ×3 IMPLANT
CAP LOCKING THREADED (Cap) ×8 IMPLANT
CLOSURE WOUND 1/2 X4 (GAUZE/BANDAGES/DRESSINGS) ×1
CONT SPEC 4OZ CLIKSEAL STRL BL (MISCELLANEOUS) ×3 IMPLANT
COVER BACK TABLE 60X90IN (DRAPES) ×3 IMPLANT
DRAPE C-ARM 42X72 X-RAY (DRAPES) ×6 IMPLANT
DRAPE LAPAROTOMY 100X72X124 (DRAPES) ×3 IMPLANT
DRAPE POUCH INSTRU U-SHP 10X18 (DRAPES) ×3 IMPLANT
DRSG OPSITE 4X5.5 SM (GAUZE/BANDAGES/DRESSINGS) ×2 IMPLANT
DRSG OPSITE POSTOP 4X6 (GAUZE/BANDAGES/DRESSINGS) ×2 IMPLANT
DRSG PAD ABDOMINAL 8X10 ST (GAUZE/BANDAGES/DRESSINGS) IMPLANT
DURAPREP 26ML APPLICATOR (WOUND CARE) ×3 IMPLANT
ELECT REM PT RETURN 9FT ADLT (ELECTROSURGICAL) ×3
ELECTRODE REM PT RTRN 9FT ADLT (ELECTROSURGICAL) ×1 IMPLANT
EVACUATOR 1/8 PVC DRAIN (DRAIN) ×2 IMPLANT
GAUZE SPONGE 4X4 12PLY STRL (GAUZE/BANDAGES/DRESSINGS) ×1 IMPLANT
GAUZE SPONGE 4X4 16PLY XRAY LF (GAUZE/BANDAGES/DRESSINGS) ×3 IMPLANT
GLOVE BIO SURGEON STRL SZ 6.5 (GLOVE) ×3 IMPLANT
GLOVE BIO SURGEONS STRL SZ 6.5 (GLOVE) ×3
GLOVE BIOGEL M 8.0 STRL (GLOVE) ×5 IMPLANT
GLOVE BIOGEL PI IND STRL 6.5 (GLOVE) IMPLANT
GLOVE BIOGEL PI IND STRL 7.0 (GLOVE) IMPLANT
GLOVE BIOGEL PI INDICATOR 6.5 (GLOVE) ×8
GLOVE BIOGEL PI INDICATOR 7.0 (GLOVE) ×4
GLOVE ECLIPSE 9.0 STRL (GLOVE) ×2 IMPLANT
GLOVE EXAM NITRILE LRG STRL (GLOVE) IMPLANT
GLOVE EXAM NITRILE MD LF STRL (GLOVE) IMPLANT
GLOVE EXAM NITRILE XL STR (GLOVE) IMPLANT
GLOVE EXAM NITRILE XS STR PU (GLOVE) IMPLANT
GLOVE SURG SS PI 6.5 STRL IVOR (GLOVE) ×6 IMPLANT
GOWN STRL REUS W/ TWL LRG LVL3 (GOWN DISPOSABLE) ×1 IMPLANT
GOWN STRL REUS W/ TWL XL LVL3 (GOWN DISPOSABLE) IMPLANT
GOWN STRL REUS W/TWL 2XL LVL3 (GOWN DISPOSABLE) IMPLANT
GOWN STRL REUS W/TWL LRG LVL3 (GOWN DISPOSABLE) ×15
GOWN STRL REUS W/TWL XL LVL3 (GOWN DISPOSABLE) ×6
KIT BASIN OR (CUSTOM PROCEDURE TRAY) ×3 IMPLANT
KIT INFUSE MEDIUM (Orthopedic Implant) ×2 IMPLANT
KIT ROOM TURNOVER OR (KITS) ×3 IMPLANT
NDL HYPO 18GX1.5 BLUNT FILL (NEEDLE) IMPLANT
NDL HYPO 21X1.5 SAFETY (NEEDLE) IMPLANT
NDL HYPO 25X1 1.5 SAFETY (NEEDLE) IMPLANT
NEEDLE HYPO 18GX1.5 BLUNT FILL (NEEDLE) IMPLANT
NEEDLE HYPO 21X1.5 SAFETY (NEEDLE) ×3 IMPLANT
NEEDLE HYPO 25X1 1.5 SAFETY (NEEDLE) ×3 IMPLANT
NS IRRIG 1000ML POUR BTL (IV SOLUTION) ×3 IMPLANT
PACK LAMINECTOMY NEURO (CUSTOM PROCEDURE TRAY) ×3 IMPLANT
PAD ARMBOARD 7.5X6 YLW CONV (MISCELLANEOUS) ×13 IMPLANT
PATTIES SURGICAL .5 X1 (DISPOSABLE) ×3 IMPLANT
PATTIES SURGICAL .5 X3 (DISPOSABLE) IMPLANT
ROD 40MM SPINAL (Rod) ×6 IMPLANT
SCREW 5.5X35MM (Screw) ×12 IMPLANT
SCREW BN 35X5.5XPA (Screw) IMPLANT
SPONGE LAP 4X18 X RAY DECT (DISPOSABLE) IMPLANT
SPONGE NEURO XRAY DETECT 1X3 (DISPOSABLE) IMPLANT
SPONGE SURGIFOAM ABS GEL 100 (HEMOSTASIS) ×3 IMPLANT
STRIP CLOSURE SKIN 1/2X4 (GAUZE/BANDAGES/DRESSINGS) ×2 IMPLANT
SUT VIC AB 1 CT1 18XBRD ANBCTR (SUTURE) ×2 IMPLANT
SUT VIC AB 1 CT1 8-18 (SUTURE) ×6
SUT VIC AB 2-0 CP2 18 (SUTURE) ×3 IMPLANT
SUT VIC AB 3-0 SH 8-18 (SUTURE) ×3 IMPLANT
SYR 20CC LL (SYRINGE) ×3 IMPLANT
SYR 5ML LL (SYRINGE) IMPLANT
TOWEL OR 17X24 6PK STRL BLUE (TOWEL DISPOSABLE) ×3 IMPLANT
TOWEL OR 17X26 10 PK STRL BLUE (TOWEL DISPOSABLE) ×3 IMPLANT
TRAY FOLEY W/METER SILVER 14FR (SET/KITS/TRAYS/PACK) ×2 IMPLANT
TRAY FOLEY W/METER SILVER 16FR (SET/KITS/TRAYS/PACK) IMPLANT
WATER STERILE IRR 1000ML POUR (IV SOLUTION) ×3 IMPLANT
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
## 2016-03-24 NOTE — Progress Notes (Signed)
Pharmacy Antibiotic Note  Jamie Schultz is a 55 y.o. female admitted on 03/24/2016 for lumbar fusion surgery. Pharmacy has been consulted for Vancomycin dosing for surgical prophylaxis. Patient with severe anaphylaxis to PCN and cephalosporins. Pt afebrile, WBC wnl. Vancomycin 1g given pre-op 7/25 at 0910. Of note, surgical drain in place.   Plan: Vancomycin 750 mg IV every 12 hours.  Goal trough 10-15 mcg/mL.  -BMP ordered (last 7/21), f/u renal function  -f/u LOT and clinical picture   Height: 5' (152.4 cm) Weight: 138 lb 9 oz (62.9 kg) IBW/kg (Calculated) : 45.5  Temp (24hrs), Avg:97.7 F (36.5 C), Min:97.3 F (36.3 C), Max:98.3 F (36.8 C)   Recent Labs Lab 03/20/16 1015  WBC 11.0*  CREATININE 0.93    Estimated Creatinine Clearance: 55.3 mL/min (by C-G formula based on SCr of 0.93 mg/dL).    Allergies  Allergen Reactions  . Bee Venom Anaphylaxis    Bees/wasps/yellow jackets  . Keflex [Cephalexin] Anaphylaxis  . Penicillins Anaphylaxis    Has patient had a PCN reaction causing immediate rash, facial/tongue/throat swelling, SOB or lightheadedness with hypotension: Yes Has patient had a PCN reaction causing severe rash involving mucus membranes or skin necrosis: No Has patient had a PCN reaction that required hospitalization Yes, in the hospital already Has patient had a PCN reaction occurring within the last 10 years: Yes If all of the above answers are "NO", then may proceed with Cephalosporin use.   . Sulfa Antibiotics Other (See Comments)    Renal failure  . Hydrocodone Rash and Other (See Comments)    "I couldn't get my breath"  . Codeine Itching and Rash  . Cyclobenzaprine Other (See Comments)    Severe constipation  . Methadone Other (See Comments)    Change in mental status  . Neurontin [Gabapentin] Hives  . Tape Itching and Rash    Blisters skin, Please use "paper" tape  . Tegretol [Carbamazepine] Hives and Rash  . Toradol [Ketorolac Tromethamine] Hives  and Rash  . Tramadol Hives and Rash  . Tylenol [Acetaminophen] Rash    Antimicrobials this admission: 7/25 Vancomycin >>   Dose adjustments this admission: N/A  Microbiology results:  7/21 MRSA PCR: negative   Thank you for allowing pharmacy to be a part of this patient's care.  Honor Loh, PharmD  Pharmacy Resident  03/24/2016 1:52 PM  Pager [phone]  I discussed / reviewed the pharmacy note by Dr. Lacinda Axon and I agree with the resident's findings and plans as documented.  Kelvin Cellar, RPh Pager: [phone]

## 2016-03-24 NOTE — Progress Notes (Signed)
At 1700, RN offered to contact MD to stop PCA (family/pt felt that it is the cause of her nausea) and start on other pain regimen but pt refused. Patient is now requesting to have PCA d/c. On call MD paged. Percocet 5/325mg  Q4HR prn and d/c PCA order. Family/pt updated.  Ave Filter, RN

## 2016-03-24 NOTE — Transfer of Care (Signed)
Immediate Anesthesia Transfer of Care Note  Patient: Jamie Schultz  Procedure(s) Performed: Procedure(s): Lumbar Four-Five Posterior lumbar interbody fusion (N/A)  Patient Location: PACU  Anesthesia Type:General  Level of Consciousness: awake, alert , oriented and patient cooperative  Airway & Oxygen Therapy: Patient Spontanous Breathing and Patient connected to nasal cannula oxygen  Post-op Assessment: Report given to RN, Post -op Vital signs reviewed and stable and Patient moving all extremities  Post vital signs: Reviewed and stable  Last Vitals:  Vitals:   03/24/16 0638 03/24/16 1205  BP: (!) 143/94   Pulse: 74   Resp: 18   Temp: 36.8 C 36.4 C    Last Pain:  Vitals:   03/24/16 1205  TempSrc:   PainSc: 10-Worst pain ever      Patients Stated Pain Goal: 2 ([id number])  Complications: No apparent anesthesia complications

## 2016-03-24 NOTE — Anesthesia Preprocedure Evaluation (Addendum)
Anesthesia Evaluation  Patient identified by MRN, date of birth, ID band Patient awake    Reviewed: Allergy & Precautions, NPO status , Patient's Chart, lab work & pertinent test results  Airway Mallampati: I  TM Distance: >3 FB Neck ROM: Limited    Dental  (+) Upper Dentures, Lower Dentures   Pulmonary COPD,  COPD inhaler, Current Smoker,    breath sounds clear to auscultation       Cardiovascular negative cardio ROS   Rhythm:Regular Rate:Normal     Neuro/Psych PSYCHIATRIC DISORDERS Anxiety Depression  Neuromuscular disease    GI/Hepatic negative GI ROS, (+) Hepatitis -, C  Endo/Other  Hypothyroidism   Renal/GU CRFRenal disease  negative genitourinary   Musculoskeletal  (+) Arthritis , Osteoarthritis,  Fibromyalgia -  Abdominal   Peds negative pediatric ROS (+)  Hematology   Anesthesia Other Findings   Reproductive/Obstetrics                            Lab Results  Component Value Date   WBC 11.0 (H) 03/20/2016   HGB 16.2 (H) 03/20/2016   HCT 48.5 (H) 03/20/2016   MCV 93.4 03/20/2016   PLT 383 03/20/2016   Lab Results  Component Value Date   CREATININE 0.93 03/20/2016   BUN 10 03/20/2016   NA 138 03/20/2016   K 4.5 03/20/2016   CL 105 03/20/2016   CO2 25 03/20/2016   Lab Results  Component Value Date   INR 1.0 06/14/2015   INR 1.0 03/23/2013   INR 1.1 01/11/2013   02/2016 EKG: normal sinus rhythm.   Anesthesia Physical Anesthesia Plan  ASA: III  Anesthesia Plan: General   Post-op Pain Management:    Induction: Intravenous  Airway Management Planned: Oral ETT and Video Laryngoscope Planned  Additional Equipment:   Intra-op Plan:   Post-operative Plan: Extubation in OR  Informed Consent: I have reviewed the patients History and Physical, chart, labs and discussed the procedure including the risks, benefits and alternatives for the proposed anesthesia with the  patient or authorized representative who has indicated his/her understanding and acceptance.     Plan Discussed with: CRNA  Anesthesia Plan Comments:        Anesthesia Quick Evaluation

## 2016-03-24 NOTE — Anesthesia Procedure Notes (Signed)
Procedure Name: Intubation Date/Time: 03/24/2016 9:20 AM Performed by: Myna Bright Pre-anesthesia Checklist: Patient identified, Suction available, Emergency Drugs available and Patient being monitored Patient Re-evaluated:Patient Re-evaluated prior to inductionOxygen Delivery Method: Circle system utilized Preoxygenation: Pre-oxygenation with 100% oxygen Intubation Type: IV induction Ventilation: Mask ventilation without difficulty and Oral airway inserted - appropriate to patient size Laryngoscope Size: Mac and 3 Grade View: Grade I Tube type: Oral Tube size: 7.0 mm Number of attempts: 1 Airway Equipment and Method: Stylet Placement Confirmation: ETT inserted through vocal cords under direct vision,  positive ETCO2 and breath sounds checked- equal and bilateral Secured at: 21 cm Tube secured with: Tape Dental Injury: Teeth and Oropharynx as per pre-operative assessment

## 2016-03-24 NOTE — Anesthesia Postprocedure Evaluation (Signed)
Anesthesia Post Note  Patient: Jamie Schultz  Procedure(s) Performed: Procedure(s) (LRB): Lumbar Four-Five Posterior lumbar interbody fusion (N/A)  Patient location during evaluation: PACU Anesthesia Type: General Level of consciousness: awake and alert Pain management: pain level controlled Vital Signs Assessment: post-procedure vital signs reviewed and stable Respiratory status: spontaneous breathing, nonlabored ventilation, respiratory function stable and patient connected to nasal cannula oxygen Cardiovascular status: blood pressure returned to baseline and stable Postop Assessment: no signs of nausea or vomiting Anesthetic complications: no    Last Vitals:  Vitals:   03/24/16 1240 03/24/16 1246  BP:    Pulse: 81 85  Resp: 10 15  Temp:  36.3 C    Last Pain:  Vitals:   03/24/16 1246  TempSrc:   PainSc: Asleep                 Effie Berkshire

## 2016-03-24 NOTE — Op Note (Signed)
NAMEDANEA, Jamie Schultz NO.:  [id number]  MEDICAL RECORD NO.:  [id number]  LOCATION:  5M20C                        FACILITY:  [facility]  PHYSICIAN:  Leeroy Cha, M.D.   DATE OF BIRTH:  Aug 13, 1961  DATE OF PROCEDURE:  03/24/2016 DATE OF DISCHARGE:                              OPERATIVE REPORT   PREOPERATIVE DIAGNOSIS:  Lumbar L4-5 spondylolisthesis with chronic radiculopathy, grade 2.  POSTOPERATIVE DIAGNOSIS:  Lumbar L4-5 spondylolisthesis with chronic radiculopathy, grade 2.  PROCEDURE:  Gill procedure with removal of spinous process of L4, the lamina of L4, and bilateral facet.  Decompression of the L4-L5 nerve root with lysis of adhesion.  Attempt to get into the disk space, but unable because of the area being narrow.  Pedicle screws at L4-L5 with posterolateral arthrodesis using autograft and BMP.  Cell Saver.  C-arm.  SURGEON:  Leeroy Cha, M.D.  ASSISTANT:  Cooper Render. Pool, M.D.  CLINICAL HISTORY:  Mrs. Colao is a lady who in the past underwent cervical fusion for stenosis.  She did well, but now she has been complaining of back pain radiation to both lower extremities associated with weakness.  The patient had quite a bit of severe neurogenic claudication.  X-rays showed grade 1-2 spondylolisthesis at the level of L4-5.  Surgery was advised and she and fiance knew the risks and benefits.  DESCRIPTION OF PROCEDURE:  The patient was taken to the OR, and after intubation, she was positioned in a prone manner.  The back was cleaned with DuraPrep.  Drapes were applied.  Midline incision from L4 down to L5-S1 was made and muscle was retracted all the way laterally.  We found indeed quite a bit of enlarge of the facet of L4-5.  We did x-ray, which showed the clip was at the level of L4 spinous process.  From then on, we removed the spinous process of L4 lamina and the facet.  The patient had quite a bit of adhesion with quite a bit of calcification of  the yellow ligament.  Decompression was done and we were able to decompress the L4 and L5 nerve root.  An attempt to get into the disk space first from the right side and then from the left side was aborted because the area was quite narrow.  From then, we proceeded with fusion and with the help of the C-arm first in AP view and then in lateral view, we made holes in the pedicles of L4-L5.  We were able to introduce four pedicle screws of 5.5 x 40.  This was kept in place using two rods plus Capps. Then using the drill, we went laterally and we removed the periosteum of the lateral aspect of the facet of L4-5 as well as the transverse process.  A mix of BMP and autograft were used for arthrodesis.  We went back again into the canal, investigated the foramen just to be sure that we were wide open.  Then, vancomycin was left in the operative site and drain was also left in the area and the wound was closed with different layer of Vicryl and Steri- Strip.          ______________________________  Leeroy Cha, M.D.     EB/MEDQ  D:  03/24/2016  T:  03/24/2016  Job:  [id number]

## 2016-03-24 NOTE — Progress Notes (Addendum)
Patient arrived to 5M01 from PACU alert at this time. Safety precautions and order reviewed. VSS. ICS and PCA encourage. Patient request to have solid food. Zofran given for nausea. No other complaints voice. Will continue to monitor.   Ave Filter, RN

## 2016-03-25 LAB — BASIC METABOLIC PANEL
ANION GAP: 7 (ref 5–15)
BUN: 20 mg/dL (ref 6–20)
CALCIUM: 8.8 mg/dL — AB (ref 8.9–10.3)
CO2: 25 mmol/L (ref 22–32)
Chloride: 104 mmol/L (ref 101–111)
Creatinine, Ser: 1.09 mg/dL — ABNORMAL HIGH (ref 0.44–1.00)
GFR, EST NON AFRICAN AMERICAN: 55 mL/min — AB (ref >60)
Glucose, Bld: 114 mg/dL — ABNORMAL HIGH (ref 65–99)
POTASSIUM: 3.9 mmol/L (ref 3.5–5.1)
SODIUM: 136 mmol/L (ref 135–145)

## 2016-03-25 MED ORDER — ALPRAZOLAM 0.5 MG PO TABS
2.0000 mg | ORAL_TABLET | Freq: Three times a day (TID) | ORAL | Status: DC
  Administered 2016-03-25 – 2016-03-26 (×3): 2 mg via ORAL
  Filled 2016-03-25 (×4): qty 4

## 2016-03-25 MED ORDER — MORPHINE SULFATE (PF) 2 MG/ML IV SOLN
2.0000 mg | INTRAVENOUS | Status: DC | PRN
  Administered 2016-03-25 (×4): 2 mg via INTRAVENOUS
  Filled 2016-03-25 (×4): qty 1

## 2016-03-25 MED ORDER — OXYCODONE HCL 5 MG PO TABS
30.0000 mg | ORAL_TABLET | ORAL | Status: DC | PRN
  Administered 2016-03-25 – 2016-03-26 (×4): 30 mg via ORAL
  Filled 2016-03-25 (×5): qty 6

## 2016-03-25 MED ORDER — MORPHINE SULFATE (PF) 2 MG/ML IV SOLN
2.0000 mg | INTRAVENOUS | Status: DC | PRN
  Administered 2016-03-25 – 2016-03-26 (×3): 2 mg via INTRAMUSCULAR
  Filled 2016-03-25 (×3): qty 1

## 2016-03-25 MED ORDER — BISACODYL 10 MG RE SUPP
10.0000 mg | Freq: Once | RECTAL | Status: AC
  Administered 2016-03-25: 10 mg via RECTAL
  Filled 2016-03-25: qty 1

## 2016-03-25 NOTE — Progress Notes (Signed)
IV was removed prior to rn day shift on 7/16

## 2016-03-25 NOTE — Progress Notes (Signed)
Husband is going to push pt in wheelchair around unit again at this time.

## 2016-03-25 NOTE — Progress Notes (Signed)
Pt walking around room without socks on or brace. Tech and rn have instructed pt not to bed, arch, or twist. Pt will be signed off independent. Pt does not want bed alarm on.

## 2016-03-25 NOTE — Progress Notes (Signed)
Patient ID: Jamie Schultz, female   DOB: 04-22-1961, 55 y.o.   MRN: [id number] Martin Majestic to see her yesterday but she was slleeping after getting seroquel. Awake, complaining of incisional pain. Had a lot of side effects with fentanyl. Wants to go to her home medication regimen, see orders

## 2016-03-25 NOTE — Progress Notes (Signed)
rn provided wheelchair to pts husband. Pt pushing pt in wheelchair around unit.

## 2016-03-25 NOTE — Evaluation (Signed)
Physical Therapy Evaluation Patient Details Name: Jamie Schultz MRN: [id number] DOB: 1960/12/31 Today's Date: 03/25/2016   History of Present Illness  Pt admitted for L4-5 decompression on 7/25. pt with h/o cervical fusion due to stenosis  Clinical Impression  Patient is s/p above surgery resulting in the deficits listed below (see PT Problem List). Pt easily distracted and initially resistant to back precautions but then demo'd good recall. Pt tolerated ambulation well and is functioning at supervision/min guard. Patient will benefit from skilled PT to increase their independence and safety with mobility (while adhering to their precautions) to allow discharge to the venue listed below.     Follow Up Recommendations Home health PT;Supervision - Intermittent (may progress and not need HHPT)    Equipment Recommendations  3in1 (PT)    Recommendations for Other Services       Precautions / Restrictions Precautions Precautions: Fall;Back Precaution Booklet Issued: Yes (comment) Precaution Comments: pt was non-compliant with  Required Braces or Orthoses: Spinal Brace Spinal Brace: Applied in sitting position;Lumbar corset Restrictions Weight Bearing Restrictions: No      Mobility  Bed Mobility Overal bed mobility: Needs Assistance Bed Mobility: Rolling;Sidelying to Sit;Sit to Sidelying Rolling: Supervision Sidelying to sit: Supervision     Sit to sidelying: Supervision General bed mobility comments: max directional v/c's for optimal transfer technique as pt was completing long sit and twisting. husband present and verbally understood precautions  Transfers Overall transfer level: Needs assistance Equipment used: Rolling walker (2 wheeled) Transfers: Sit to/from Stand Sit to Stand: Supervision         General transfer comment: v/c's for safe hand placement  Ambulation/Gait Ambulation/Gait assistance: Min guard Ambulation Distance (Feet): 100 Feet Assistive device:  Rolling walker (2 wheeled) Gait Pattern/deviations: Step-through pattern;Decreased stride length Gait velocity: slow Gait velocity interpretation: Below normal speed for age/gender General Gait Details: antalgic gait pattern  Stairs            Wheelchair Mobility    Modified Rankin (Stroke Patients Only)       Balance Overall balance assessment: Needs assistance             Standing balance comment: needs walker for safe ambulation                             Pertinent Vitals/Pain Pain Assessment: 0-10 Pain Score: 8  Pain Location: surgical site Pain Intervention(s): Patient requesting pain meds-RN notified;Monitored during session    Home Living Family/patient expects to be discharged to:: Private residence Living Arrangements: Spouse/significant other Available Help at Discharge: Family;Available 24 hours/day Type of Home: House Home Access: Stairs to enter Entrance Stairs-Rails: None Entrance Stairs-Number of Steps: 3 small Home Layout: One level Home Equipment: Walker - 2 wheels;Walker - 4 wheels;Bedside commode      Prior Function Level of Independence: Independent               Hand Dominance   Dominant Hand: Left    Extremity/Trunk Assessment   Upper Extremity Assessment: Overall WFL for tasks assessed           Lower Extremity Assessment: Generalized weakness (due to back pain from surgery)      Cervical / Trunk Assessment: Other exceptions (back surgery)  Communication   Communication: No difficulties  Cognition Arousal/Alertness: Awake/alert Behavior During Therapy: Impulsive Overall Cognitive Status: Impaired/Different from baseline Area of Impairment: Safety/judgement     Memory: Decreased recall of precautions   Safety/Judgement:  Decreased awareness of safety;Decreased awareness of deficits     General Comments: pt easily distracted with poor attentsion    General Comments General comments (skin  integrity, edema, etc.): emotional regarding back issues over the course of 15 years    Exercises        Assessment/Plan    PT Assessment Patient needs continued PT services  PT Diagnosis Difficulty walking;Generalized weakness;Acute pain   PT Problem List Decreased strength;Decreased range of motion;Decreased activity tolerance;Decreased balance;Decreased mobility  PT Treatment Interventions DME instruction;Gait training;Stair training;Functional mobility training;Therapeutic activities;Therapeutic exercise;Balance training   PT Goals (Current goals can be found in the Care Plan section) Acute Rehab PT Goals Patient Stated Goal: home PT Goal Formulation: With patient Time For Goal Achievement: 04/01/16 Potential to Achieve Goals: Good    Frequency Min 5X/week   Barriers to discharge        Co-evaluation               End of Session Equipment Utilized During Treatment: Gait belt;Back brace Activity Tolerance: Patient tolerated treatment well Patient left: in chair;with call bell/phone within reach;with nursing/sitter in room Nurse Communication: Mobility status         Time: [id number] PT Time Calculation (min) (ACUTE ONLY): 36 min   Charges:   PT Evaluation $PT Eval Moderate Complexity: 1 Procedure PT Treatments $Gait Training: 8-22 mins   PT G CodesKingsley Callander 03/25/2016, 11:31 AM  Kittie Plater, PT, DPT Pager #: [phone] Office #: [phone]

## 2016-03-25 NOTE — Progress Notes (Signed)
rn noted IV was slightly difficult to flush with 1425 dose of pain medication. At 1825 dose of pain medication at then end of medicine pt noted pain and IV infiltrated. Minor swelling noted to area. IV removed and pt given ice pack to apply to area.   rn offered to page IV team to start another IV, pt declined and is okay without having an IV. rn offered to call md and ask if last dose of morphine could be given IM.   rn paged md, waiting for response.

## 2016-03-25 NOTE — Progress Notes (Signed)
Pt requesting to have her husband push her around in a wheelchair. rn explained to pt that she can be pushed in wheelchair around the unit. Husband said pt wanted to go downstairs to view the sale they were having. rn explained that pt needs to stay on the unit.

## 2016-03-25 NOTE — Evaluation (Signed)
Occupational Therapy Evaluation Patient Details Name: Jamie Schultz MRN: [id number] DOB: Jan 19, 1961 Today's Date: 03/25/2016    History of Present Illness Pt admitted for L4-5 decompression on 7/25. pt with h/o cervical fusion due to stenosis   Clinical Impression   Patient evaluated by Occupational Therapy with no further acute OT needs identified. All education has been completed and the patient has no further questions. See below for any follow-up Occupational Therapy or equipment needs. OT to sign off. Thank you for referral.      Follow Up Recommendations  No OT follow up    Equipment Recommendations  None recommended by OT    Recommendations for Other Services       Precautions / Restrictions Precautions Precautions: Back Precaution Booklet Issued: Yes (comment) Precaution Comments: able to verbalize and demo Required Braces or Orthoses: Spinal Brace Spinal Brace: Lumbar corset;Applied in sitting position Restrictions Weight Bearing Restrictions: No      Mobility Bed Mobility Overal bed mobility: Modified Independent Bed Mobility: Rolling;Sidelying to Sit;Sit to Sidelying Rolling: Supervision Sidelying to sit: Supervision     Sit to sidelying: Supervision General bed mobility comments: hob flat and correct sequence  Transfers Overall transfer level: Modified independent Equipment used: Rolling walker (2 wheeled) Transfers: Sit to/from Stand Sit to Stand: Supervision         General transfer comment: v/c's for safe hand placement    Balance Overall balance assessment: Needs assistance             Standing balance comment: needs walker for safe ambulation                            ADL Overall ADL's : Modified independent                                       General ADL Comments: pt demonstrates bed mobility from elevated bed. pt fully dressed and able to cross bil LE supine. pt able to don doff brace mod I.  Spouse present for all education     Vision     Perception     Praxis      Pertinent Vitals/Pain Pain Assessment: No/denies pain Pain Score: 8  Pain Location: surgical site Pain Intervention(s): Patient requesting pain meds-RN notified;Monitored during session     Hand Dominance Left   Extremity/Trunk Assessment Upper Extremity Assessment Upper Extremity Assessment: LUE deficits/detail LUE Deficits / Details: hx of  surg by Dr Mardelle Matte   Lower Extremity Assessment Lower Extremity Assessment: Defer to PT evaluation   Cervical / Trunk Assessment Cervical / Trunk Assessment: Other exceptions (hx cervical surg and s/p surg )   Communication Communication Communication: No difficulties   Cognition Arousal/Alertness: Awake/alert Behavior During Therapy: Impulsive Overall Cognitive Status: Within Functional Limits for tasks assessed Area of Impairment: Safety/judgement     Memory: Decreased recall of precautions   Safety/Judgement: Decreased awareness of safety;Decreased awareness of deficits     General Comments: pt easily distracted, fixated on pain clinic appointment  aug 6th. Pt describing all injuries and previous doctors without questioning from therapist.    General Comments       Exercises       Shoulder Instructions      Home Living Family/patient expects to be discharged to:: Private residence Living Arrangements: Spouse/significant other Available Help at Discharge: Family;Available 24 hours/day Type of  Home: House Home Access: Stairs to enter [employer] of Steps: 3 small Entrance Stairs-Rails: None Home Layout: One level     Bathroom Shower/Tub: Corporate investment banker: Standard Bathroom Accessibility: Yes   Home Equipment: Environmental consultant - 2 wheels;Walker - 4 wheels;Bedside commode          Prior Functioning/Environment Level of Independence: Independent             OT Diagnosis:     OT Problem List:      OT Treatment/Interventions:      OT Goals(Current goals can be found in the care plan section) Acute Rehab OT Goals Patient Stated Goal: home  OT Frequency:     Barriers to D/C:            Co-evaluation              End of Session Equipment Utilized During Treatment: Back brace Nurse Communication: Mobility status;Precautions  Activity Tolerance: Patient tolerated treatment well Patient left: in bed;with call bell/phone within reach;with bed alarm set;with family/visitor present   Time: [id number] OT Time Calculation (min): 20 min Charges:  OT General Charges $OT Visit: 1 Procedure OT Evaluation $OT Eval Moderate Complexity: 1 Procedure G-Codes:    Peri Maris 04-19-16, 1:13 PM  Jeri Modena   OTR/L Pager: [phone] Office: [phone] .

## 2016-03-25 NOTE — Care Management Note (Signed)
Case Management Note  Patient Details  Name: Jamie Schultz MRN: [id number] Date of Birth: 1961/07/09  Subjective/Objective:  Pt underwent: Lumbar Four-Five Posterior lumbar interbody fusion. She is from home with her spouse.                   Action/Plan: Awaiting PT/OT recommendations. CM following for discharge needs.   Expected Discharge Date:                  Expected Discharge Plan:  Home/Self Care  In-House Referral:     Discharge planning Services     Post Acute Care Choice:    Choice offered to:     DME Arranged:    DME Agency:     HH Arranged:    HH Agency:     Status of Service:  In process, will continue to follow  If discussed at Long Length of Stay Meetings, dates discussed:    Additional Comments:  Pollie Friar, RN 03/25/2016, 10:57 AM

## 2016-03-25 NOTE — Progress Notes (Signed)
Pt walking around room without brace on, rn asked if pt wanted to put brace on. Pt said no. Pt does not want the bed alarm on, pt is independent minus being connected to IV pole.

## 2016-03-26 ENCOUNTER — Encounter (HOSPITAL_COMMUNITY): Payer: Self-pay | Admitting: Neurosurgery

## 2016-03-26 ENCOUNTER — Telehealth: Payer: Self-pay | Admitting: Family Medicine

## 2016-03-26 NOTE — Discharge Summary (Signed)
Physician Discharge Summary  Patient ID: TALISSA LAHTI MRN: [id number] DOB/AGE: 1960-10-18 55 y.o.  Admit date: 03/24/2016 Discharge date: 03/26/2016  Admission Diagnoses:lumbar spondylolisthesis  Discharge Diagnoses:  Active Problems:   Spondylolisthesis of lumbar region   Discharged Condition: ambulating. Residual chronic pain. No weakness  Hospital Course: surgery  Consults: none  Significant Diagnostic Studies:myelogram  Treatments: {[id number] fusion Discharge Exam: Blood pressure (!) 115/52, pulse 98, temperature 98.5 F (36.9 C), temperature source Oral, resp. rate 18, height 5' (1.524 m), weight 62.9 kg (138 lb 9 oz), SpO2 99 %. No weakness  Disposition: 01-Home or Self Care     Medication List    TAKE these medications   amphetamine-dextroamphetamine 15 MG tablet Commonly known as:  ADDERALL Take 1 tablet by mouth 2 (two) times daily.   diazepam 10 MG tablet Commonly known as:  VALIUM Take 1 tablet (10 mg total) by mouth 3 (three) times daily as needed for anxiety.   hydrOXYzine 25 MG tablet Commonly known as:  ATARAX/VISTARIL TAKE 1 TABLET (25 MG TOTAL) BY MOUTH EVERY 8 (EIGHT) HOURS AS NEEDED.   ipratropium-albuterol 0.5-2.5 (3) MG/3ML Soln Commonly known as:  DUONEB Take 3 mLs by nebulization every 6 (six) hours. What changed:  when to take this  reasons to take this   LYRICA 100 MG capsule Generic drug:  pregabalin Take 1 capsule (100 mg total) by mouth 3 (three) times daily.   mometasone-formoterol 200-5 MCG/ACT Aero Commonly known as:  DULERA Inhale 2 puffs into the lungs 2 (two) times daily.   multivitamin with minerals Tabs tablet Take 1 tablet by mouth daily.   NASONEX 50 MCG/ACT nasal spray Generic drug:  mometasone PLACE 2 SPRAYS EACH NOSTRIL EVERY DAY What changed:  See the new instructions.   promethazine 25 MG tablet Commonly known as:  PHENERGAN Take 1 tablet (25 mg total) by mouth every 8 (eight) hours as needed for  nausea or vomiting.   QUEtiapine 400 MG tablet Commonly known as:  SEROQUEL take 2 tablets by mouth daily        Signed: Caliope Ruppert M 03/26/2016, 9:54 AM

## 2016-03-26 NOTE — Care Management Note (Signed)
Case Management Note  Patient Details  Name: Jamie Schultz MRN: [id number] Date of Birth: 1960-12-31  Subjective/Objective:                    Action/Plan: Pt discharging home with orders for [facility] services. 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 Pt and her husband made aware. Pt with orders for 3 in 1, walker and tub bench. Jermaine with [facility] DME notified and will deliver the equipment to her room. Will update the bedside RN.   Expected Discharge Date:                  Expected Discharge Plan:  Home/Self Care  In-House Referral:     Discharge planning Services  CM Consult  Post Acute Care Choice:  Durable Medical Equipment Choice offered to:  Patient, Spouse  DME Arranged:  3-N-1, Walker youth, Tub bench DME Agency:  [place]:   (pt's medicaid will not cover [facility] services for her diagnosis) [place] Agency:     Status of Service:  Completed, signed off  If discussed at [place] of Stay Meetings, dates discussed:    Additional Comments:  Pollie Friar, RN 03/26/2016, 10:42 AM

## 2016-03-26 NOTE — Telephone Encounter (Signed)
Pt requested a refill on soma 350 mg and lyrica.  Her call back number is [phone]

## 2016-03-26 NOTE — Progress Notes (Signed)
Patient is being d/c home. D/c instructions given and patient verbalized understanding. 

## 2016-03-26 NOTE — Progress Notes (Signed)
Patient ID: Jamie Schultz, female   DOB: [birth year], 55 y.o.   MRN: [id number] PATIENT WANTS TO GO HOME WITH ROXICODONE 40, PO MORPHINE, XANAX, DIAZEPAM. I will call pharmacy to see her and see if this can be done. She sees a pain clinic ap outpatient

## 2016-03-26 NOTE — Progress Notes (Signed)
Patient encouraged to call for assistance due to BLE weakness. Patient refuses to wear back brace or use front rolling walker. Patient agreed to ambulate with walker given weakness. Patient laid supine and refused to reposition q 2 hours throughout night shift. Patient states she was d/c with previous neck surgery with 40 mg oxy IR the weaned off with post op follow-up appointments. Patient states she cannot take percocet due to allergy to tylenol. Patient and family voiced need of [facility] care. RN provided all information to oncoming nursing staff.

## 2016-03-27 MED ORDER — LYRICA 100 MG PO CAPS: 100.0000 mg | ORAL_CAPSULE | Freq: Three times a day (TID) | ORAL | 0 refills | Status: DC

## 2016-03-27 NOTE — Telephone Encounter (Signed)
I am willing to give her 1 mos supply of Lyrica. She however must have an appt for the the [facility].  Thanks! AK

## 2016-03-27 NOTE — Telephone Encounter (Signed)
As per pt's husband had surgery on 07/25 for Posterior lumbar interbody fusion and unable to move around can't come for appointment please suggest ?

## 2016-03-27 NOTE — Telephone Encounter (Signed)
Pt's husband advised as per Amy he will make appointment soon.

## 2016-03-27 NOTE — Telephone Encounter (Signed)
Please inform this patient that she needs an office visit for refills on controlled medications.  Thanks! AK

## 2016-04-07 ENCOUNTER — Emergency Department (HOSPITAL_COMMUNITY)
Admission: EM | Admit: 2016-04-07 | Discharge: 2016-04-07 | Disposition: A | Discharge status: Home / Self Care | Payer: Medicaid Other | Attending: Emergency Medicine | Admitting: Emergency Medicine

## 2016-04-07 ENCOUNTER — Encounter (HOSPITAL_COMMUNITY): Payer: Self-pay | Admitting: Vascular Surgery

## 2016-04-07 DIAGNOSIS — E039 Hypothyroidism, unspecified: Secondary | ICD-10-CM | POA: Insufficient documentation

## 2016-04-07 DIAGNOSIS — F17210 Nicotine dependence, cigarettes, uncomplicated: Secondary | ICD-10-CM | POA: Diagnosis not present

## 2016-04-07 DIAGNOSIS — Z9660 Presence of unspecified orthopedic joint implant: Secondary | ICD-10-CM | POA: Diagnosis not present

## 2016-04-07 DIAGNOSIS — N189 Chronic kidney disease, unspecified: Secondary | ICD-10-CM | POA: Diagnosis not present

## 2016-04-07 DIAGNOSIS — J449 Chronic obstructive pulmonary disease, unspecified: Secondary | ICD-10-CM | POA: Insufficient documentation

## 2016-04-07 DIAGNOSIS — M545 Low back pain: Secondary | ICD-10-CM | POA: Diagnosis not present

## 2016-04-07 DIAGNOSIS — M549 Dorsalgia, unspecified: Secondary | ICD-10-CM | POA: Diagnosis present

## 2016-04-07 MED ORDER — OXYCODONE HCL ER 40 MG PO T12A: 40.0000 mg | EXTENDED_RELEASE_TABLET | Freq: Two times a day (BID) | ORAL | 0 refills | Status: AC

## 2016-04-07 MED ORDER — OXYCODONE HCL 5 MG PO TABS
40.0000 mg | ORAL_TABLET | Freq: Once | ORAL | Status: AC
  Administered 2016-04-07: 40 mg via ORAL
  Filled 2016-04-07: qty 8

## 2016-04-07 NOTE — ED Triage Notes (Signed)
Pt reports to the ED for eval of back pain. She recently had back surgery by Dr. Joya Salm. Pt was prescribed Oxycodone 40 mgs. She states that they changed the pills to 4- 10 mg IR oxycodone q 4-6 hours she states she her prescription ran out early because they did not change the prescription to an increased number of pills. She has been taking it as prescribed. Last dose at 8 am this am. Pt requesting something for pain relief.

## 2016-04-07 NOTE — ED Notes (Signed)
Ditty MD paged and page returned giving verbal order for 40mg  ER every 12 hrs for 2 days; Regenia Skeeter, MD updated

## 2016-04-07 NOTE — ED Triage Notes (Signed)
Family very very upset  That they have been waiting so long.  They were supposed to be getting a rx and leaving and they have seen other people go before them.  Time given
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
## 2016-04-08 IMAGING — MR MR CERVICAL SPINE W/O CM
5 series · 38 of 48 positions shown · non-contrast
Comparison: Cervical spine CT 10/24/2013 and earlier.

CLINICAL DATA: 54-year-old female with cervical spine pain
radiating to both shoulders and arms. Acute on chronic pain.
Previous posterior fusion. Subsequent encounter.

EXAM:
MRI CERVICAL SPINE WITHOUT CONTRAST
TECHNIQUE: Multiplanar, multisequence MR imaging of the cervical spine was
performed. No intravenous contrast was administered.

[Series 2: T2 · sagittal · 3.0mm · 0.56mm/px · 6 of 15 slices shown (1 of 3)]
[im 1/15]
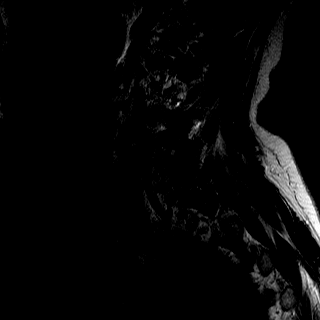
[im 3/15]
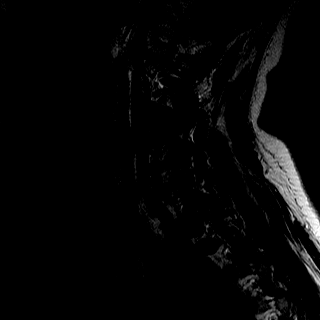
[im 6/15]
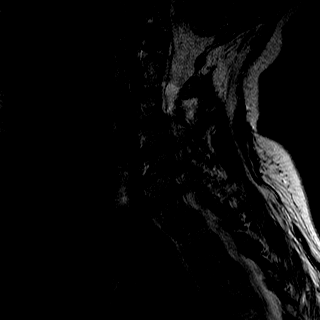
[im 9/15]
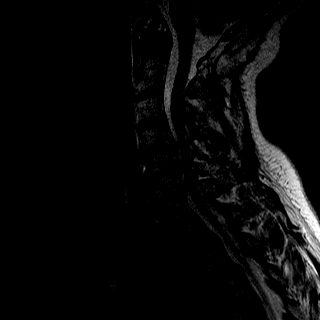
[im 12/15]
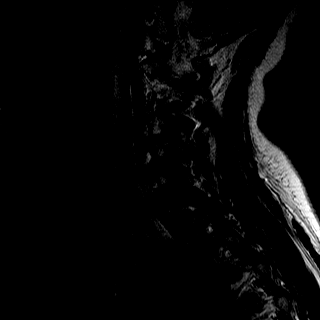
[im 15/15]
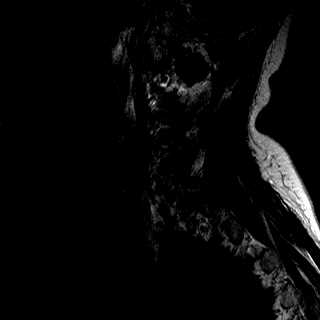

[Series 3: T1 · sagittal · 3.0mm · 0.70mm/px · 7 of 15 slices shown]
[im 1/15]
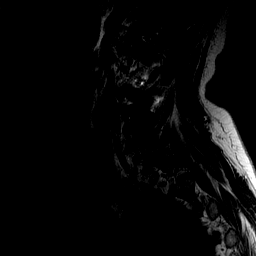
[im 3/15]
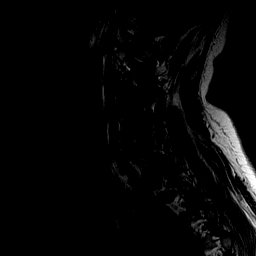
[im 5/15]
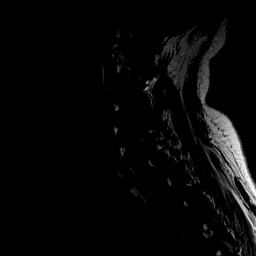
[im 8/15]
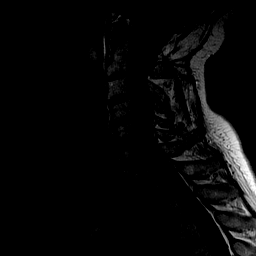
[im 10/15]
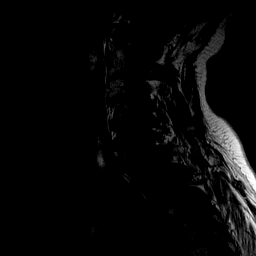
[im 12/15]
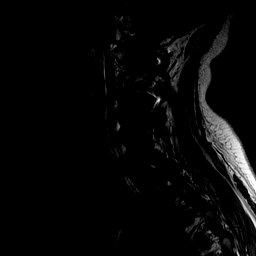
[im 15/15]
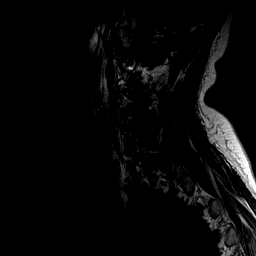

[Series 4: T2 · sagittal · 3.0mm · 0.70mm/px · 7 of 15 slices shown (2 of 3)]
[im 1/15]
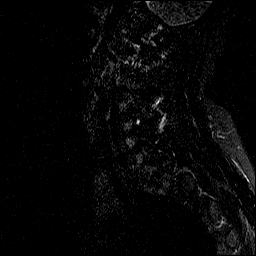
[im 3/15]
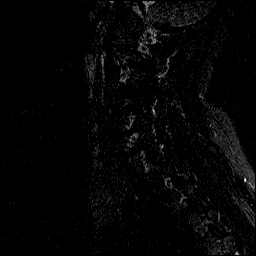
[im 5/15]
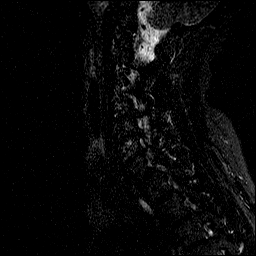
[im 8/15]
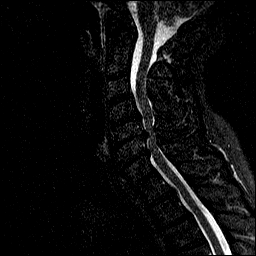
[im 10/15]
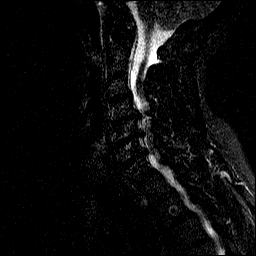
[im 12/15]
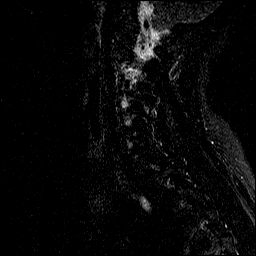
[im 15/15]
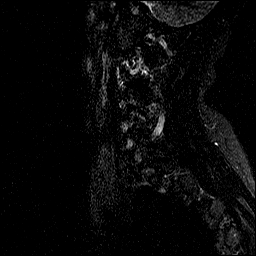

[Series 5: T2 · axial · 3.0mm · 0.70mm/px · z∈[-46,+60]mm · 10 of 29 slices shown (3 of 3)]
[im 1/29]
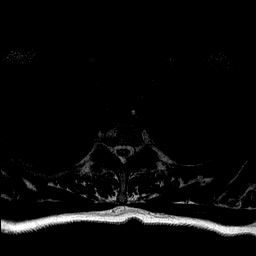
[im 3/29]
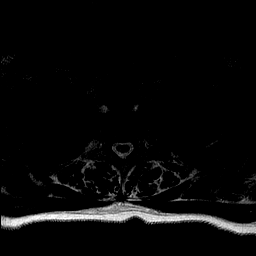
[im 5/29]
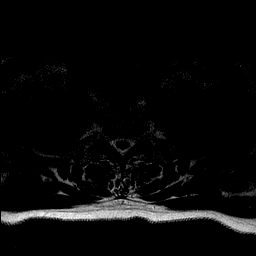
[im 7/29]
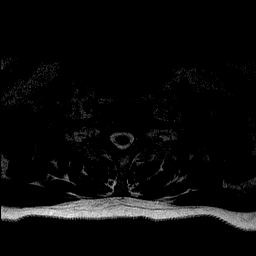
[im 9/29]
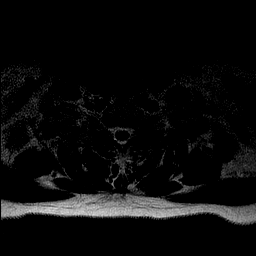
[im 13/29]
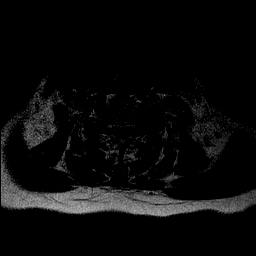
[im 16/29]
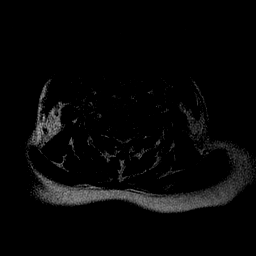
[im 20/29]
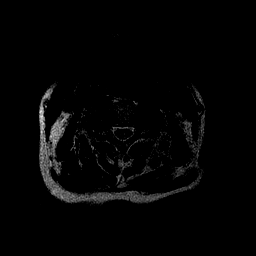
[im 24/29]
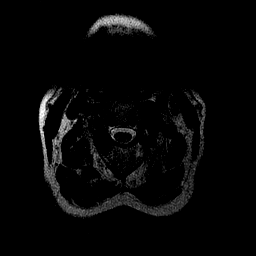
[im 29/29]
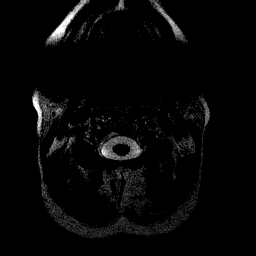

[Series 6: mpgr ax · axial · 3.0mm · 0.35mm/px · z∈[-46,+60]mm · 8 of 29 slices shown]
[im 1/29]
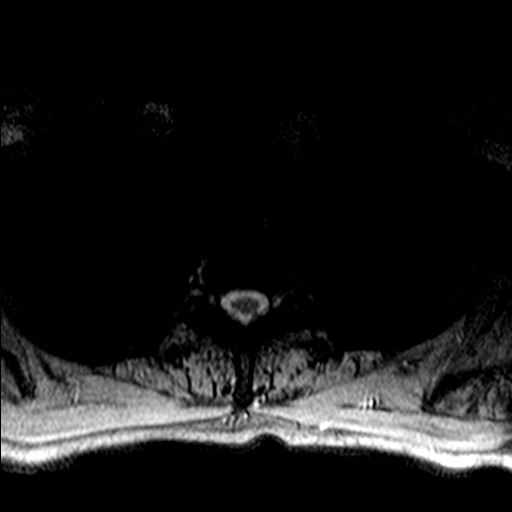
[im 5/29]
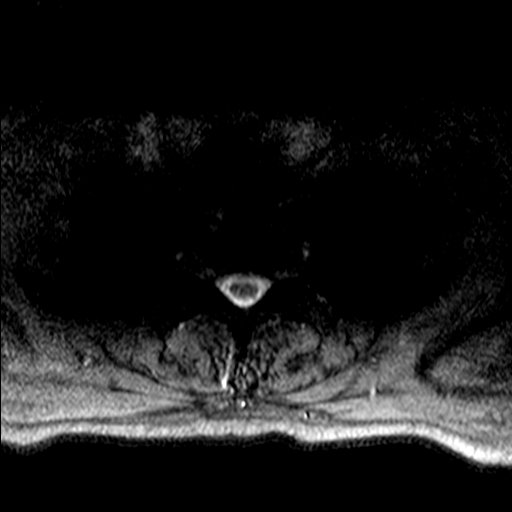
[im 9/29]
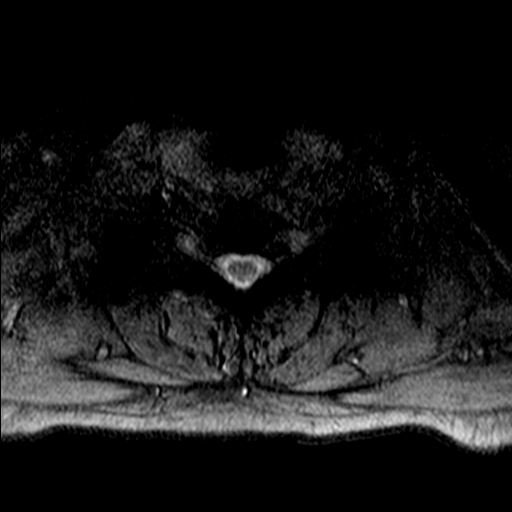
[im 13/29]
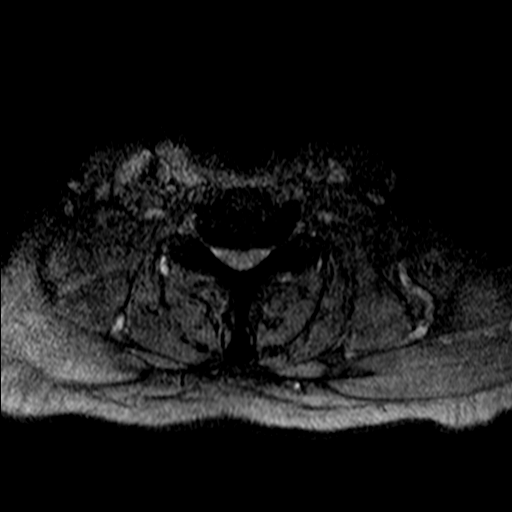
[im 16/29]
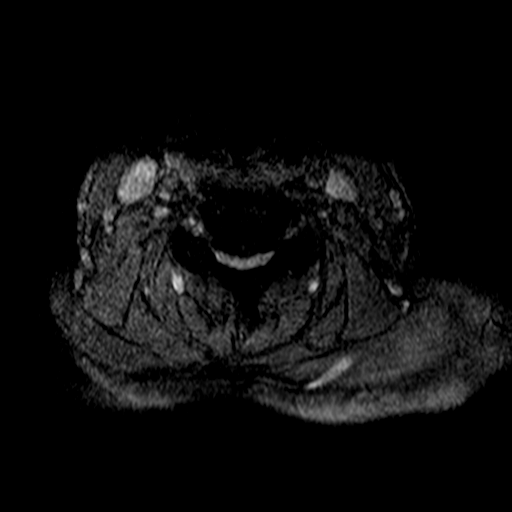
[im 20/29]
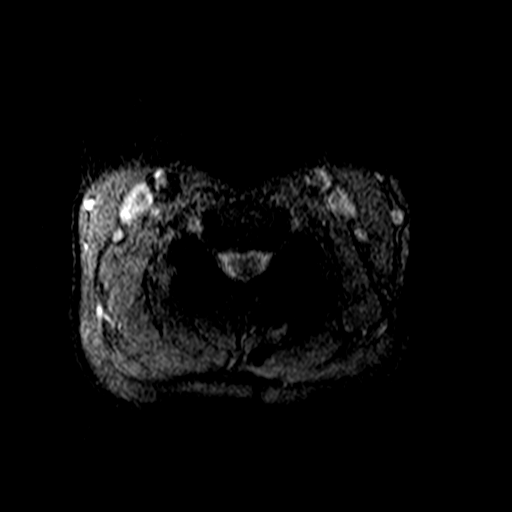
[im 24/29]
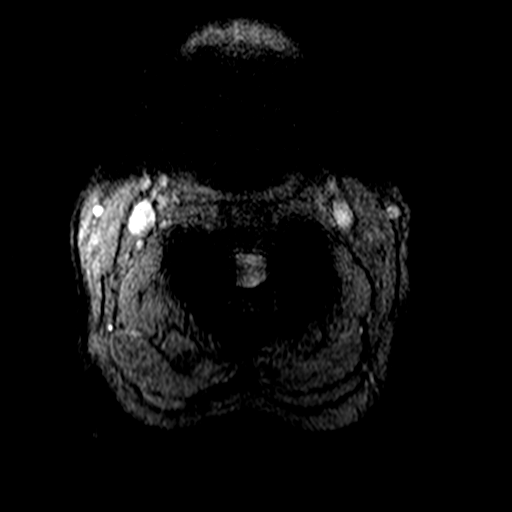
[im 29/29]
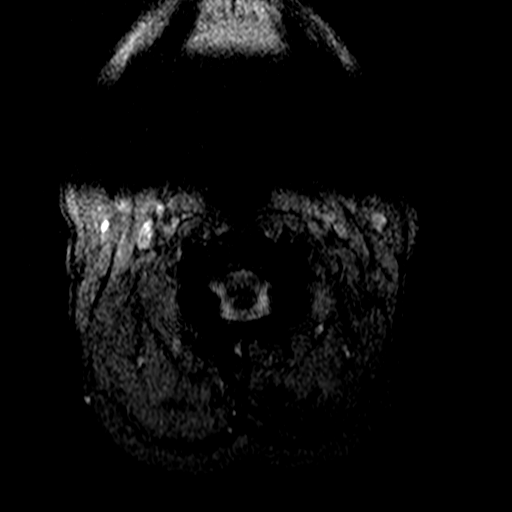

[38 of 48 positions shown; findings below may reference images not displayed]

FINDINGS: Mild susceptibility artifact related to the posterior C2-C3 laminar
hardware better demonstrated on CT. Stable vertebral height
alignment with reversal of lower cervical lordosis. Widespread disc
and endplate degeneration. No marrow edema or evidence of acute
osseous abnormality.

Cervicomedullary junction is within normal limits. No cervical
spinal cord signal abnormality. Negative paraspinal soft tissues.

C2-C3: Negative. Solid posterior element arthrodesis demonstrated on
the recent CT.

C3-C4: Trace anterolisthesis is stable. Mild disc bulge. Mild
ligament flavum hypertrophy. Hardware artifact partially obscuring
the bilateral posterior elements and neural foramina. Uncovertebral
and facet hypertrophy though demonstrated on the 1587 CT with severe
left greater than right C4 foraminal stenosis.

C4-C5: Chronic circumferential disc osteophyte complex. Broad-based
posterior component of disc. Mild ligament flavum hypertrophy.
Spinal stenosis with mild spinal cord mass effect, no cord signal
abnormality. Facet and uncovertebral hypertrophy with severe
bilateral C5 foraminal stenosis.

C5-C6: Chronic disc space loss and circumferential disc osteophyte
complex with broad-based left paracentral posterior component
(series 6, image 15). Ligament flavum hypertrophy. Spinal stenosis
with moderate to severe spinal cord mass effect. No cord signal
abnormality. Facet and uncovertebral hypertrophy with severe
bilateral C6 foraminal stenosis.

C6-C7: Chronic circumferential disc osteophyte complex broad-based
posterior component of disc. Mild ligament flavum hypertrophy.
Spinal stenosis with mild cord mass effect. Uncovertebral and facet
hypertrophy with severe bilateral C7 foraminal stenosis.

C7-T1: Stable mild anterolisthesis. Negative disc. Moderate facet
hypertrophy. Moderate left and mild right C8 foraminal stenosis.

T1-T2 broad-based disc extrusion narrowing the ventral CSF space
without significant spinal stenosis. Upper thoracic facet
hypertrophy. No upper thoracic spinal stenosis.
IMPRESSION: 1. Advanced cervical spine degeneration with multilevel spinal
stenosis. Spinal cord mass effect is moderate to severe at C5-C6,
but there is no associated cord signal abnormality.
2. Primarily uncovertebral and facet related severe neural foraminal
stenosis at the bilateral C4, C5, C6, and C7 nerve levels. Moderate
left C8 foraminal stenosis.
3. Previous posterior C2-C3 fusion.

## 2016-04-08 IMAGING — MR MR LUMBAR SPINE W/O CM
4 of 5 series · 15 of 48 positions shown · non-contrast
Comparison: CT abdomen and pelvis 01/20/2014.

CLINICAL DATA: Low back and bilateral hip pain for least 2 years.
No known injury. Initial encounter.

EXAM:
MRI LUMBAR SPINE WITHOUT CONTRAST
TECHNIQUE: Multiplanar, multisequence MR imaging of the lumbar spine was
performed. No intravenous contrast was administered.

[Series 3: T2 · sagittal · 4.0mm · 0.44mm/px · 6 of 17 slices shown (1 of 2)]
[im 1/17]
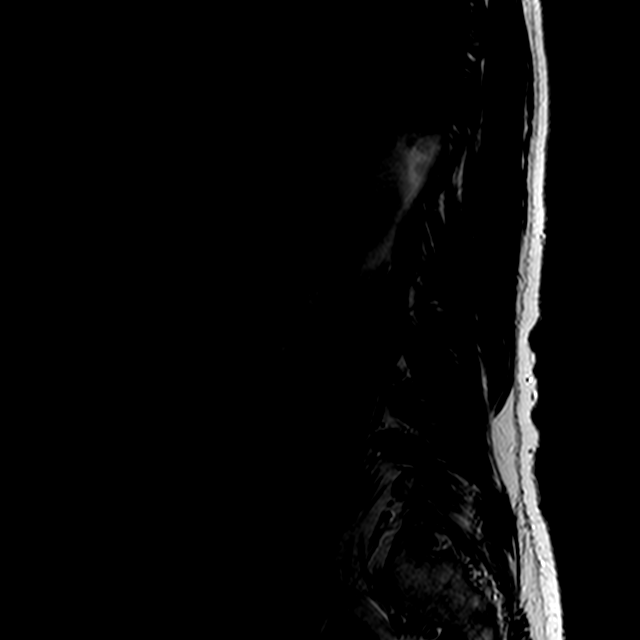
[im 4/17]
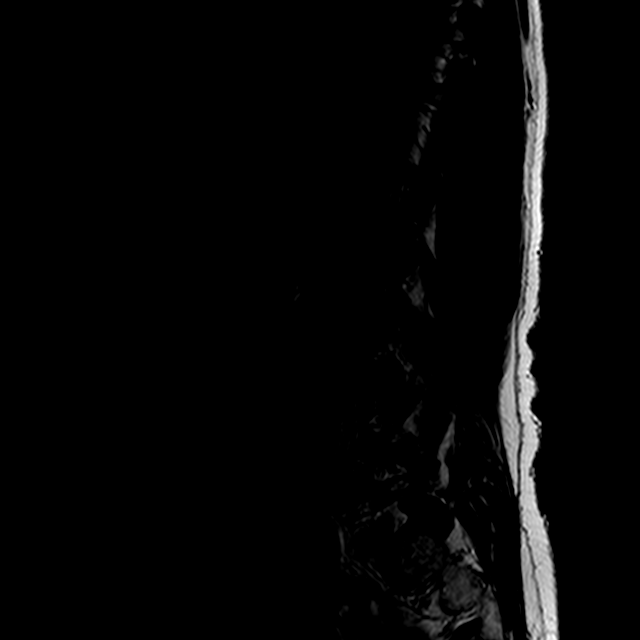
[im 7/17]
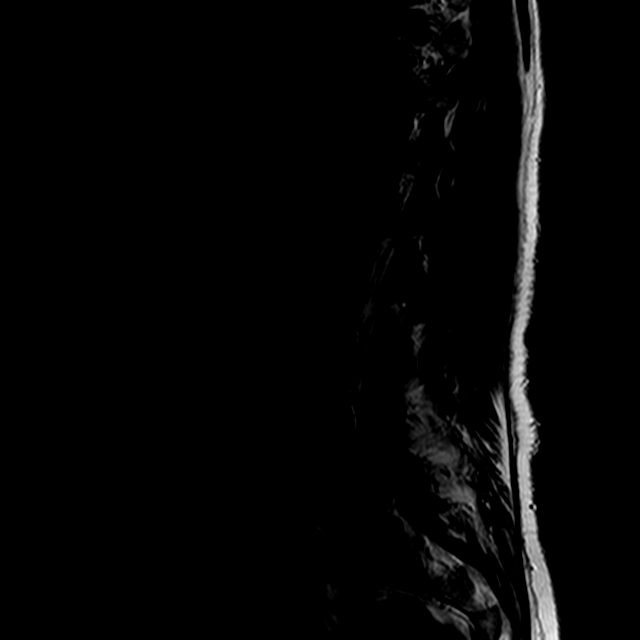
[im 10/17]
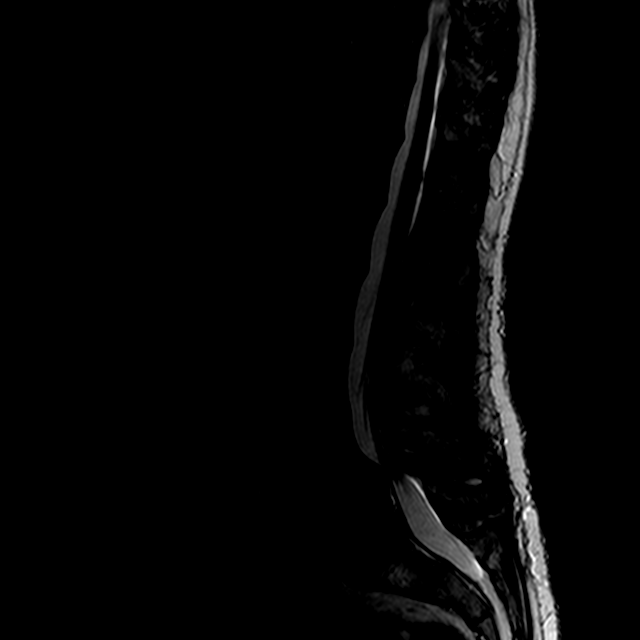
[im 13/17]
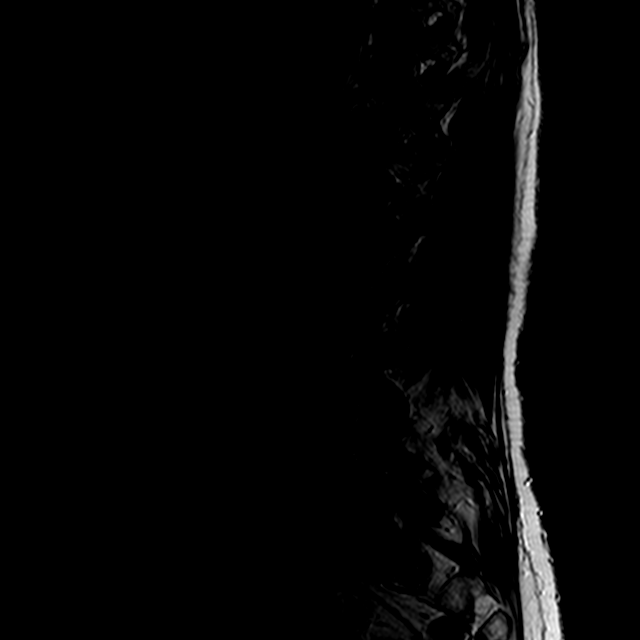
[im 17/17]
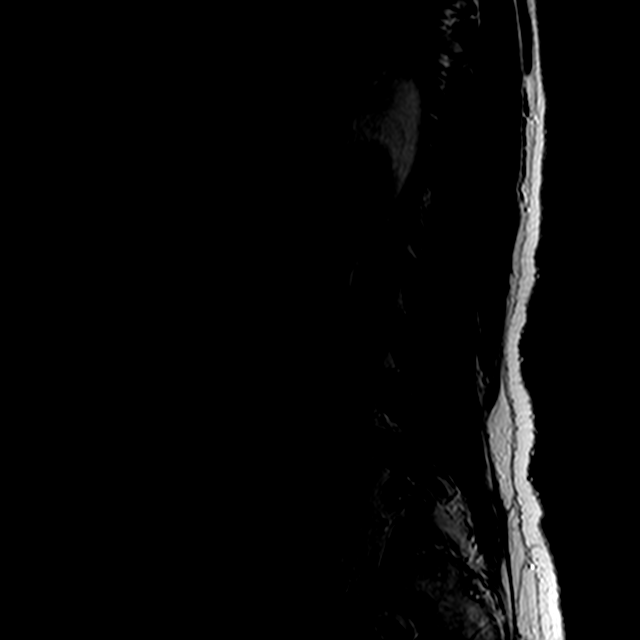

[Series 4: T1 · sagittal · 4.0mm · 0.44mm/px · 3 of 17 slices shown (1 of 2)]
[im 3/17]
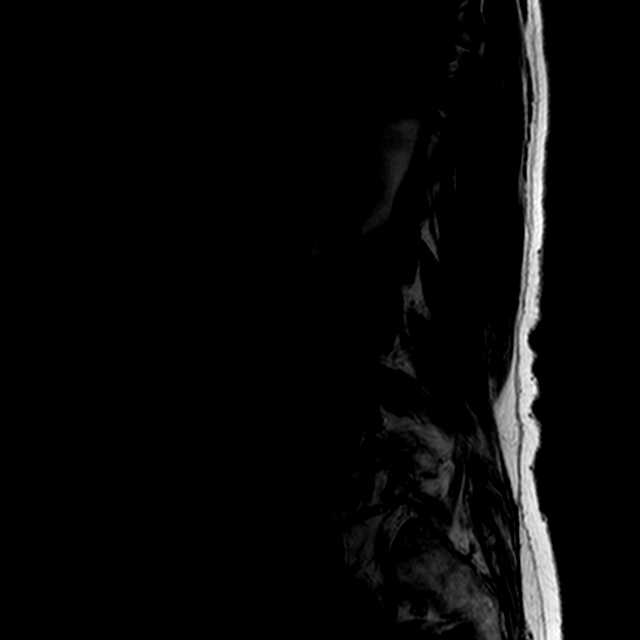
[im 9/17]
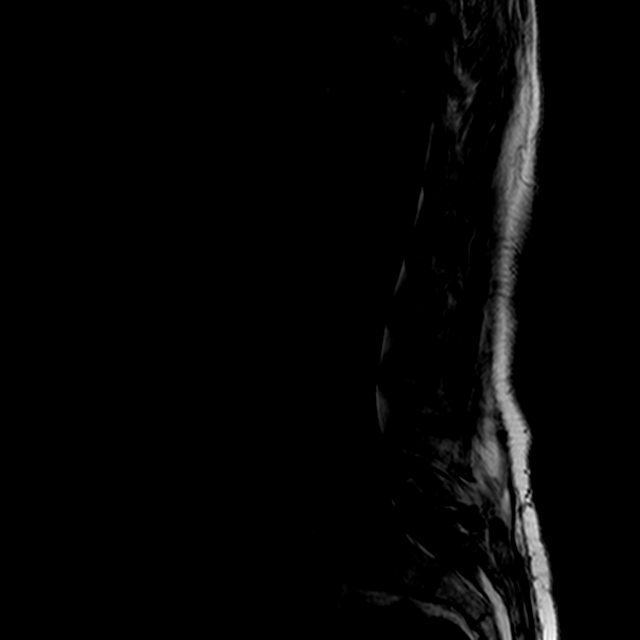
[im 14/17]
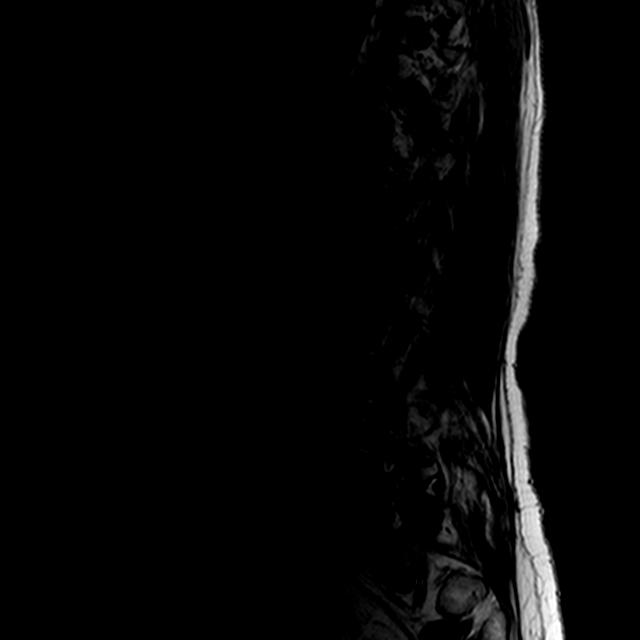

[Series 6: T2 · axial · 4.0mm · 0.39mm/px · z∈[-45,+108]mm · 3 of 36 slices shown (2 of 2)]
[im 6/36]
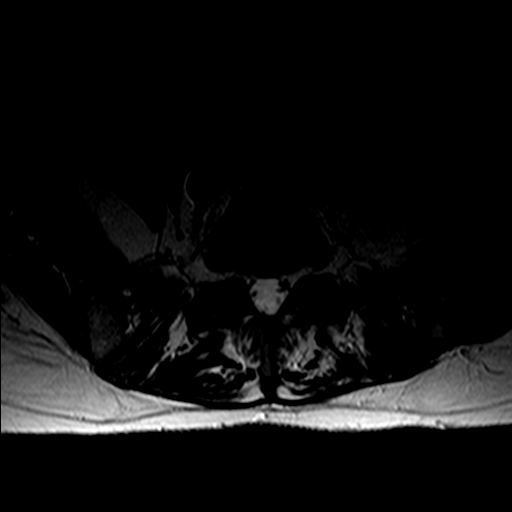
[im 19/36]
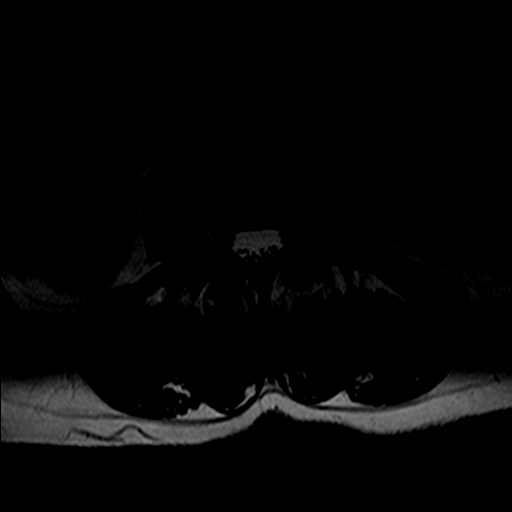
[im 30/36]
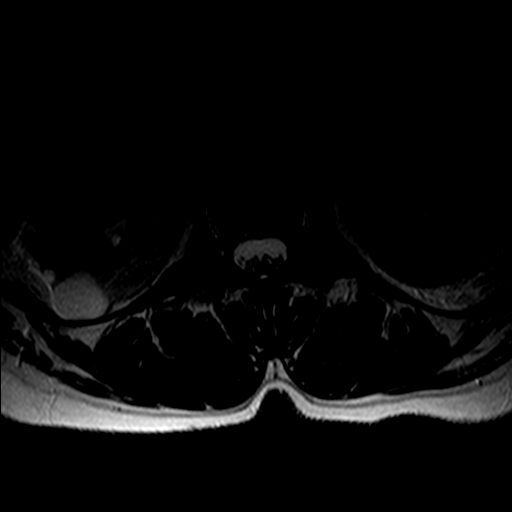

[Series 7: T1 · axial · 4.0mm · 0.39mm/px · z∈[-45,+108]mm · 3 of 36 slices shown (2 of 2)]
[im 6/36]
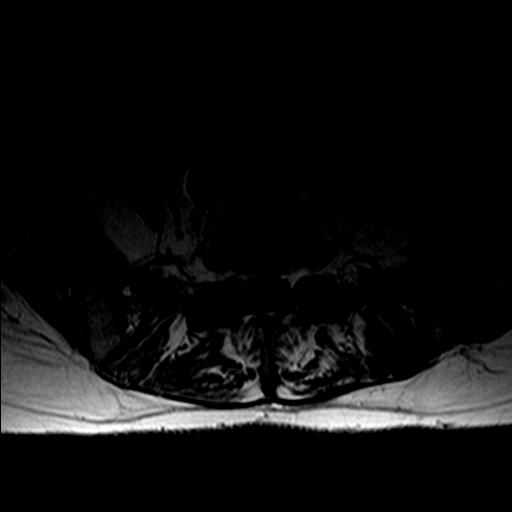
[im 19/36]
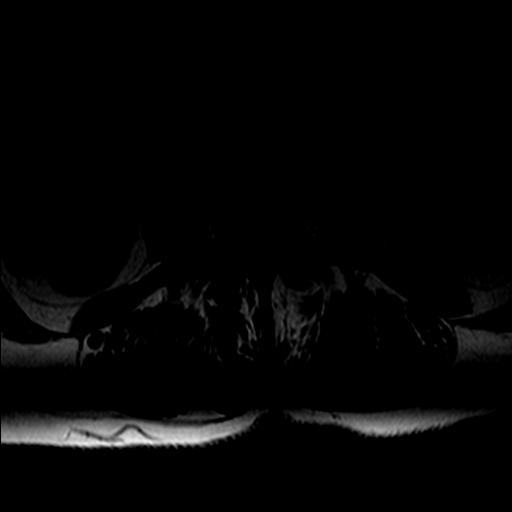
[im 30/36]
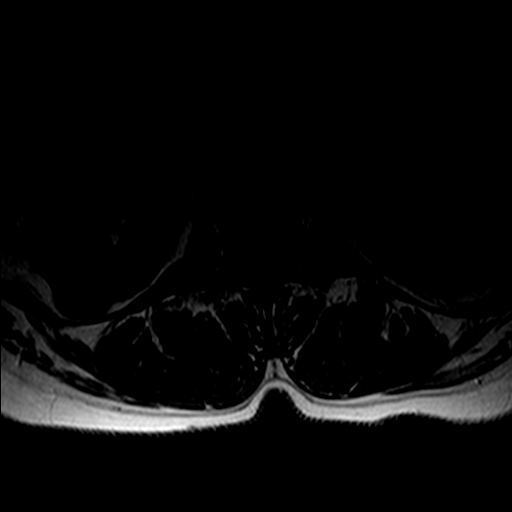

[15 of 48 positions shown; findings below may reference images not displayed]

FINDINGS: 1.1 cm anterolisthesis L4 on L5 due to facet arthropathy is
unchanged. Vertebral body alignment is otherwise maintained.
Vertebral body height is normal. There is some degenerative endplate
signal change at L4-5 and a hemangioma in S1. Marrow signal is
otherwise unremarkable. The conus medullaris is normal in signal and
position. Imaged intra-abdominal contents demonstrate T2
hyperintense lesions in the kidneys most consistent with cysts.

The T9-10, T10-11 and T11-12 levels are imaged in the sagittal plane
only. Mild disc bulging is seen at each level without central canal
or foraminal stenosis.

T12-L1:  Negative.

L1-2:  Negative.

L2-3:  Mild facet arthropathy.  Otherwise negative.

L3-4:  Mild facet arthropathy.  Otherwise negative.

L4-5: Advanced bilateral facet degenerative change is present and
there is prominent ligamentum flavum thickening. The disc is
uncovered. Severe central canal stenosis is identified. Moderate to
moderately severe foraminal narrowing is worse on the left.

L5-S1: Small annular fissure and a shallow disc bulge without
central canal or foraminal stenosis.
IMPRESSION: Severe central canal stenosis and moderate to moderately severe
bilateral foraminal narrowing, worse on the left, at L4-5 where
advanced facet degenerative disease results in 1.1 cm
anterolisthesis. Mild spondylosis is seen at other levels as
described above without central canal or foraminal narrowing.

## 2016-04-24 ENCOUNTER — Ambulatory Visit: Payer: Medicaid Other | Admitting: Family Medicine

## 2016-04-24 ENCOUNTER — Ambulatory Visit (INDEPENDENT_AMBULATORY_CARE_PROVIDER_SITE_OTHER): Payer: Medicaid Other | Admitting: Family Medicine

## 2016-04-24 VITALS — BP 115/77 | HR 74 | Temp 97.9°F | Resp 16 | Ht 60.0 in | Wt 137.6 lb

## 2016-04-24 DIAGNOSIS — R7303 Prediabetes: Secondary | ICD-10-CM | POA: Diagnosis not present

## 2016-04-24 DIAGNOSIS — J432 Centrilobular emphysema: Secondary | ICD-10-CM

## 2016-04-24 DIAGNOSIS — G8929 Other chronic pain: Secondary | ICD-10-CM

## 2016-04-24 DIAGNOSIS — M549 Dorsalgia, unspecified: Secondary | ICD-10-CM | POA: Diagnosis not present

## 2016-04-24 DIAGNOSIS — M4802 Spinal stenosis, cervical region: Secondary | ICD-10-CM

## 2016-04-24 DIAGNOSIS — K625 Hemorrhage of anus and rectum: Secondary | ICD-10-CM | POA: Diagnosis not present

## 2016-04-24 DIAGNOSIS — H538 Other visual disturbances: Secondary | ICD-10-CM

## 2016-04-24 LAB — CBC WITH DIFFERENTIAL/PLATELET
Basophils Absolute: 73 cells/uL (ref 0–200)
Basophils Relative: 1 %
EOS PCT: 7 %
Eosinophils Absolute: 511 cells/uL — ABNORMAL HIGH (ref 15–500)
HCT: 40.8 % (ref 35.0–45.0)
HEMOGLOBIN: 13.2 g/dL (ref 11.7–15.5)
LYMPHS ABS: 2555 {cells}/uL (ref 850–3900)
LYMPHS PCT: 35 %
MCH: 29.6 pg (ref 27.0–33.0)
MCHC: 32.4 g/dL (ref 32.0–36.0)
MCV: 91.5 fL (ref 80.0–100.0)
MPV: 10.6 fL (ref 7.5–12.5)
Monocytes Absolute: 365 cells/uL (ref 200–950)
Monocytes Relative: 5 %
NEUTROS PCT: 52 %
Neutro Abs: 3796 cells/uL (ref 1500–7800)
PLATELETS: 382 10*3/uL (ref 140–400)
RBC: 4.46 MIL/uL (ref 3.80–5.10)
RDW: 14.5 % (ref 11.0–15.0)
WBC: 7.3 10*3/uL (ref 3.8–10.8)

## 2016-04-24 LAB — BASIC METABOLIC PANEL WITH GFR
BUN: 20 mg/dL (ref 7–25)
CALCIUM: 9.4 mg/dL (ref 8.6–10.4)
CO2: 26 mmol/L (ref 20–31)
Chloride: 106 mmol/L (ref 98–110)
Creat: 0.99 mg/dL (ref 0.50–1.05)
GFR, EST AFRICAN AMERICAN: 73 mL/min (ref >=60)
GFR, EST NON AFRICAN AMERICAN: 63 mL/min (ref >=60)
Glucose, Bld: 63 mg/dL — ABNORMAL LOW (ref 65–99)
Potassium: 4.6 mmol/L (ref 3.5–5.3)
SODIUM: 139 mmol/L (ref 135–146)

## 2016-04-24 MED ORDER — MOMETASONE FURO-FORMOTEROL FUM 200-5 MCG/ACT IN AERO: 2.0000 | INHALATION_SPRAY | Freq: Two times a day (BID) | RESPIRATORY_TRACT | 11 refills | Status: DC

## 2016-04-24 MED ORDER — METFORMIN HCL 500 MG PO TABS: 500.0000 mg | ORAL_TABLET | Freq: Two times a day (BID) | ORAL | 11 refills | Status: DC

## 2016-04-24 MED ORDER — PREDNISONE 20 MG PO TABS: 40.0000 mg | ORAL_TABLET | Freq: Every day | ORAL | 0 refills | Status: DC

## 2016-04-24 MED ORDER — ALBUTEROL SULFATE HFA 108 (90 BASE) MCG/ACT IN AERS: 2.0000 | INHALATION_SPRAY | Freq: Four times a day (QID) | RESPIRATORY_TRACT | 0 refills | Status: DC | PRN

## 2016-04-24 MED ORDER — CARISOPRODOL 350 MG PO TABS: 350.0000 mg | ORAL_TABLET | Freq: Three times a day (TID) | ORAL | 0 refills | Status: DC

## 2016-04-24 MED ORDER — LYRICA 100 MG PO CAPS: 100.0000 mg | ORAL_CAPSULE | Freq: Three times a day (TID) | ORAL | 5 refills | Status: DC

## 2016-04-24 NOTE — Progress Notes (Signed)
Subjective:    Patient ID: Jamie Schultz, female    DOB: [birth year], 55 y.o.   MRN: [id number]  HPI: Jamie Schultz is a 55 y.o. female presenting on 04/24/2016 for Follow-up (4 month )   HPI  Pt presents for follow-up visit. She is requesting Soma today. She is not happy with her pain provider. She was given 135mg  oxycodone on AUgust 10. She reports she is out. She is requesting Soma today. She states her pain clinic is refusing to give her valium or Soma. She is on multiple controlled medications from multiple providers. She reports soma is the only thing that helps her muscle spasms.  She reports she has quit smoking since her surgery.  She reports she has a prolapsed rectum- she had an issues with this for years. Is having to take laxatives daily to help her have a bowel movement. Having bright red blood when she manipulates the rectoceole. Having tiny clots as weel.   Past Medical History:  Diagnosis Date  . Abscess   . Allergy   . Anxiety   . Arthritis   . Blood transfusion without reported diagnosis   . Chronic back pain   . 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 Chronic kidney disease   . Congenital prolapsed rectum   . COPD (chronic obstructive pulmonary disease) ([place])   . Fibromyalgia   . Hepatitis C   . History of kidney stones   . Kidney failure    acute - reaction to sulfa drugs  . MRSA (methicillin resistant staph aureus) culture positive    Hx: of  . Muscle spasm   . Numbness   . Numbness and tingling of foot    Right  . Panic attack   . Pneumonia   . Shortness of breath dyspnea    WITH HUMITY  . Spinal stenosis    cervical region  . Wears dentures    full upper and lower    Current Outpatient Prescriptions on File Prior to Visit  Medication Sig  . amphetamine-dextroamphetamine (ADDERALL) 15 MG tablet Take 1 tablet by mouth 2 (two) times daily.  . hydrOXYzine (ATARAX/VISTARIL) 25 MG tablet TAKE 1 TABLET (25 MG TOTAL) BY MOUTH EVERY 8 (EIGHT) HOURS AS NEEDED.   [name] ipratropium-albuterol (DUONEB) 0.5-2.5 (3) MG/3ML SOLN Take 3 mLs by nebulization every 6 (six) hours. (Patient taking differently: Take 3 mLs by nebulization 2 (two) times daily as needed. )  . Multiple Vitamin (MULITIVITAMIN WITH MINERALS) TABS Take 1 tablet by mouth daily.  [name] NASONEX 50 MCG/ACT nasal spray PLACE 2 SPRAYS EACH NOSTRIL EVERY DAY (Patient taking differently: PLACE 2 SPRAYS EACH NOSTRIL EVERY DAY AS NEEDED FOR ALLERGIES)  . promethazine (PHENERGAN) 25 MG tablet Take 1 tablet (25 mg total) by mouth every 8 (eight) hours as needed for nausea or vomiting.  [name] QUEtiapine (SEROQUEL) 400 MG tablet take 2 tablets by mouth daily   No current facility-administered medications on file prior to visit.     Review of Systems  Constitutional: Negative for chills and fever.  HENT: Negative.   Respiratory: Positive for cough and wheezing. Negative for chest tightness.   Cardiovascular: Negative for chest pain and leg swelling.  Gastrointestinal: Negative for abdominal pain, constipation, diarrhea, nausea and vomiting.  Endocrine: Negative.  Negative for cold intolerance, heat intolerance, polydipsia, polyphagia and polyuria.  Genitourinary: Negative for difficulty urinating and dysuria.  Musculoskeletal: Positive for back pain and neck pain.  Neurological: Positive for numbness. Negative for dizziness and  light-headedness.  Psychiatric/Behavioral: Negative.  Negative for decreased concentration and suicidal ideas. The patient is not nervous/anxious.    Per HPI unless specifically indicated above     Objective:    BP 115/77 (BP Location: Left Arm, Patient Position: Sitting, Cuff Size: Normal)   Pulse 74   Temp 97.9 F (36.6 C) (Oral)   Resp 16   Ht 5' (1.524 m)   Wt 137 lb 9.6 oz (62.4 kg)   SpO2 96%   BMI 26.87 kg/m   Wt Readings from Last 3 Encounters:  04/24/16 137 lb 9.6 oz (62.4 kg)  04/07/16 136 lb (61.7 kg)  03/24/16 138 lb 9 oz (62.9 kg)    Physical Exam    Constitutional: She is oriented to person, place, and time. She appears well-developed and well-nourished.  HENT:  Head: Normocephalic and atraumatic.  Neck: Neck supple.  Cardiovascular: Normal rate, regular rhythm and normal heart sounds.  Exam reveals no gallop and no friction rub.   No murmur heard. Pulmonary/Chest: Effort normal. She has wheezes in the right upper field and the left upper field. She has no rhonchi. She has no rales. She exhibits no tenderness.  Abdominal: Soft. Normal appearance and bowel sounds are normal. She exhibits no distension and no mass. There is no tenderness. There is no rebound and no guarding.  Musculoskeletal: She exhibits no edema.       Lumbar back: She exhibits decreased range of motion, tenderness and spasm (pt reports daily muscle spasms. ). She exhibits no swelling and no edema.  Limited ROM 2/2 recent back surgery.   Lymphadenopathy:    She has no cervical adenopathy.  Neurological: She is alert and oriented to person, place, and time.  Skin: Skin is warm and dry.   Results for orders placed or performed during the hospital encounter of [id number]  Basic metabolic panel  Result Value Ref Range   Sodium 136 135 - 145 mmol/L   Potassium 3.9 3.5 - 5.1 mmol/L   Chloride 104 101 - 111 mmol/L   CO2 25 22 - 32 mmol/L   Glucose, Bld 114 (H) 65 - 99 mg/dL   BUN 20 6 - 20 mg/dL   Creatinine, Ser 1.09 (H) 0.44 - 1.00 mg/dL   Calcium 8.8 (L) 8.9 - 10.3 mg/dL   GFR calc non Af Amer 55 (L) >60 mL/min   GFR calc Af Amer >60 >60 mL/min   Anion gap 7 5 - 15      Assessment & Plan:   Problem List Items Addressed This Visit      Respiratory   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HCC)   Relevant Medications   NICOTINE STEP 2 14 MG/24HR patch   albuterol (PROVENTIL HFA;VENTOLIN HFA) 108 (90 Base) MCG/ACT inhaler   predniSONE (DELTASONE) 20 MG tablet   mometasone-formoterol (DULERA) 200-5 MCG/ACT AERO     Other   Chronic back pain - Primary   Relevant Medications    Oxycodone HCl 10 MG TABS   LYRICA 100 MG capsule   carisoprodol (SOMA) 350 MG tablet   predniSONE (DELTASONE) 20 MG tablet   Cervical stenosis of spinal canal    Other Visit Diagnoses    Bright red blood per rectum       Relevant Orders   CBC with Differential   BASIC METABOLIC PANEL WITH GFR   Ambulatory referral to Gastroenterology   Prediabetes       Relevant Medications   metFORMIN (GLUCOPHAGE) 500 MG tablet   Blurred vision  Relevant Orders   Ambulatory referral to Ophthalmology      Meds ordered this encounter  Medications  . alprazolam (XANAX) 2 MG tablet    Sig: TAKE ONE TABLET BY MOUTH 3 TIMES DAILY AS NEEDED    Refill:  3  . NICOTINE STEP 2 14 MG/24HR patch    Sig: PLACE 1 PATCH ONTO SKIN DAILY    Refill:  0  . Oxycodone HCl 10 MG TABS    Sig: TAKE ONE TAB BY MOUTH 4 TIMES A DAY FOR 15 DAYS, THEN 1 TAB EVERY 4-6 HOURS AS NEEDED FOR 15 DAYS    Refill:  0  . BELSOMRA 20 MG TABS    Sig: TAKE ONE TABLET BY MOUTH ONCE NIGHTLY AS DIRECTED    Refill:  3  . LYRICA 100 MG capsule    Sig: Take 1 capsule (100 mg total) by mouth 3 (three) times daily.    Dispense:  90 capsule    Refill:  5    Order Specific Question:   Supervising Provider    Answer:   Arlis Porta [phone]  . metFORMIN (GLUCOPHAGE) 500 MG tablet    Sig: Take 1 tablet (500 mg total) by mouth 2 (two) times daily with a meal.    Dispense:  30 tablet    Refill:  11    Order Specific Question:   Supervising Provider    Answer:   Arlis Porta [phone]  . carisoprodol (SOMA) 350 MG tablet    Sig: Take 1 tablet (350 mg total) by mouth 3 (three) times daily.    Dispense:  30 tablet    Refill:  0    Order Specific Question:   Supervising Provider    Answer:   Arlis Porta [phone]  . DISCONTD: mometasone-formoterol (DULERA) 200-5 MCG/ACT AERO    Sig: Inhale 2 puffs into the lungs 2 (two) times daily.    Dispense:  1 Inhaler    Refill:  11    Order Specific Question:   Supervising  Provider    Answer:   Arlis Porta [phone]  . albuterol (PROVENTIL HFA;VENTOLIN HFA) 108 (90 Base) MCG/ACT inhaler    Sig: Inhale 2 puffs into the lungs every 6 (six) hours as needed for wheezing or shortness of breath.    Dispense:  1 Inhaler    Refill:  0    Order Specific Question:   Supervising Provider    Answer:   Arlis Porta [phone]  . predniSONE (DELTASONE) 20 MG tablet    Sig: Take 2 tablets (40 mg total) by mouth daily with breakfast.    Dispense:  10 tablet    Refill:  0    Order Specific Question:   Supervising Provider    Answer:   Arlis Porta [phone]  . mometasone-formoterol (DULERA) 200-5 MCG/ACT AERO    Sig: Inhale 2 puffs into the lungs 2 (two) times daily.    Dispense:  1 Inhaler    Refill:  11    Order Specific Question:   Supervising Provider    Answer:   Arlis Porta [phone]      Follow up plan: Return in about 6 weeks (around 06/05/2016), or if symptoms worsen or fail to improve, for A1c check. [name]

## 2016-04-24 NOTE — Patient Instructions (Signed)
I have given you 30 pills of Soma today.  I am concerned about the number of controlled medications from the number of providers you are getting. I will fill no other controlled medications other than Lyrica after today. I am also concerned with the number of oxycodone you are taking daily. It is dangerous for you take more than prescribed by your provider.  For you breathing: Please take prednisone 40mg  once daily for 5 days. Please take [facility] daily. And use albuterol inhaler as needed.  Please seek immediate medical attention if you develop shortness of breath not relieve by inhaler, chest pain/tightness, fever > 103 F or other concerning symptoms.   Please start taking Metformin once daily to help with your blood sugars. Avoid juice, soda, bread rice and pasta.  Bright red rectal bleeding- I have placed a referral to Gastroenterology for you today. IF you have large amounts of rectal bleeding, severe abdominal pain, or other concerning symptoms- please go to the ER.

## 2016-04-24 NOTE — Assessment & Plan Note (Addendum)
Pt was being seen at [facility] pain management. Per her neurosurgery notes- they will not cover her pain. Pt reports she is not going to [facility] because they are not adequately treating her pain. Per [place] CSRS pt has used 135 oxycodone since 04/09/2016. Have told this patient I am concerned about her overuse of pain medications and receiving multiple controlled medications from various providers. Pt became very tearful and agitated when I expressed concern about giving her the requested Soma. I feel if the soma is for her back pain, it should be managed by her pain provider.  She began bargaining with me and asking for just 1 month. I have agreed to give her 30 pills of Soma at this time with no refills. I have agreed to manage her Lyrica.  Pt called after appt to request referral to 2 pain management facilities. Only one referral will be placed at a time.

## 2016-04-24 NOTE — Telephone Encounter (Signed)
Please let this patient know that we cannot do 2 referrals to the same speciality at once. I have referred her to Dr. Jaci Lazier office as they may be easier to get into then [facility].  The likelihood that they will see her in 2 weeks in low, I do recommend continuing with [facility] or discussing pain management with her surgeon until she can be seen by the pain clinic.

## 2016-04-24 NOTE — Assessment & Plan Note (Signed)
Noted in the hospital on 03/02/2016 with A1c of 6.3. Start metformin today. Recheck A1c in 6 weeks. Encouraged diet and lifestyle changes.

## 2016-04-24 NOTE — Assessment & Plan Note (Signed)
Treat for exacerbation today. Have refilled Dulera at this time. Pt instructed to take daily. If symptoms continue- she will need to see pulmonology.

## 2016-04-24 NOTE — Telephone Encounter (Signed)
Patient called requesting a referral to [facility] and [facility].  She also wanted to ask you to please send her neur notes so they would understand her problem.  She will be out of meds in 2 weeks.

## 2016-04-27 ENCOUNTER — Telehealth: Payer: Self-pay | Admitting: Family Medicine

## 2016-04-27 ENCOUNTER — Telehealth: Payer: Self-pay | Admitting: *Deleted

## 2016-04-27 DIAGNOSIS — M549 Dorsalgia, unspecified: Principal | ICD-10-CM

## 2016-04-27 DIAGNOSIS — G8929 Other chronic pain: Secondary | ICD-10-CM

## 2016-04-27 DIAGNOSIS — R7303 Prediabetes: Secondary | ICD-10-CM

## 2016-04-27 DIAGNOSIS — M2550 Pain in unspecified joint: Secondary | ICD-10-CM

## 2016-04-27 MED ORDER — GLUCOSE BLOOD VI STRP: ORAL_STRIP | 12 refills | Status: DC

## 2016-04-27 MED ORDER — FREESTYLE LANCETS MISC: 12 refills | Status: DC

## 2016-04-27 MED ORDER — FREESTYLE SYSTEM KIT: PACK | 0 refills | Status: DC

## 2016-04-27 NOTE — Telephone Encounter (Signed)
Pt advised.

## 2016-04-27 NOTE — Telephone Encounter (Signed)
[facility] is not accepting new patient for pain management. They are only accepting new patient's for procedures at this time. What is you second choice of referral.

## 2016-04-27 NOTE — Telephone Encounter (Signed)
Pt advised as per Amy pt understood very well.

## 2016-04-27 NOTE — Telephone Encounter (Signed)
Please let her know that it was sent to [facility]. Medicaid may not pay for strips and lancets with a diagnosis of prediabetes only but we can try. Check blood glucose 3 times weekly.

## 2016-04-27 NOTE — Telephone Encounter (Signed)
Pt wants to get a prescription for a glucose meter sent to [facility] [place].  She was told she was prediabetic so she thought medicaid would pay for it.  Her call back number is [phone]

## 2016-04-27 NOTE — Telephone Encounter (Signed)
[facility]. Referral placed.

## 2016-04-28 ENCOUNTER — Ambulatory Visit: Payer: Medicaid Other | Admitting: Family Medicine

## 2016-04-29 ENCOUNTER — Other Ambulatory Visit: Payer: Self-pay | Admitting: Family Medicine

## 2016-05-05 ENCOUNTER — Ambulatory Visit: Payer: Medicaid Other | Admitting: Neurology

## 2016-05-05 ENCOUNTER — Telehealth: Payer: Self-pay | Admitting: *Deleted

## 2016-05-05 ENCOUNTER — Telehealth: Payer: Self-pay | Admitting: Family Medicine

## 2016-05-05 NOTE — Telephone Encounter (Signed)
Verbal was given to [facility] pharmacy as per Amy.

## 2016-05-05 NOTE — Telephone Encounter (Signed)
Canceled follow up less than 24 hours prior to appt.

## 2016-05-05 NOTE — Telephone Encounter (Signed)
[place] at [facility] asked if they could do a generic for pt's epi pen.  Her call back number is [phone]

## 2016-05-11 ENCOUNTER — Ambulatory Visit: Payer: Medicaid Other | Admitting: Neurology

## 2016-05-14 ENCOUNTER — Other Ambulatory Visit: Payer: Self-pay | Admitting: Family Medicine

## 2016-05-14 IMAGING — CR DG LUMBAR SPINE 2-3V
1 series · 3 of 3 positions shown · non-contrast
Comparison: 08/12/2015

CLINICAL DATA: Low back pain, status post a bowling ball falling on
patient's back.

EXAM:
LUMBAR SPINE - 2-3 VIEW

[Series 1: t lumbar spine lat · 0.14mm/px · 3 of 3 slices shown]
[im 1/3]
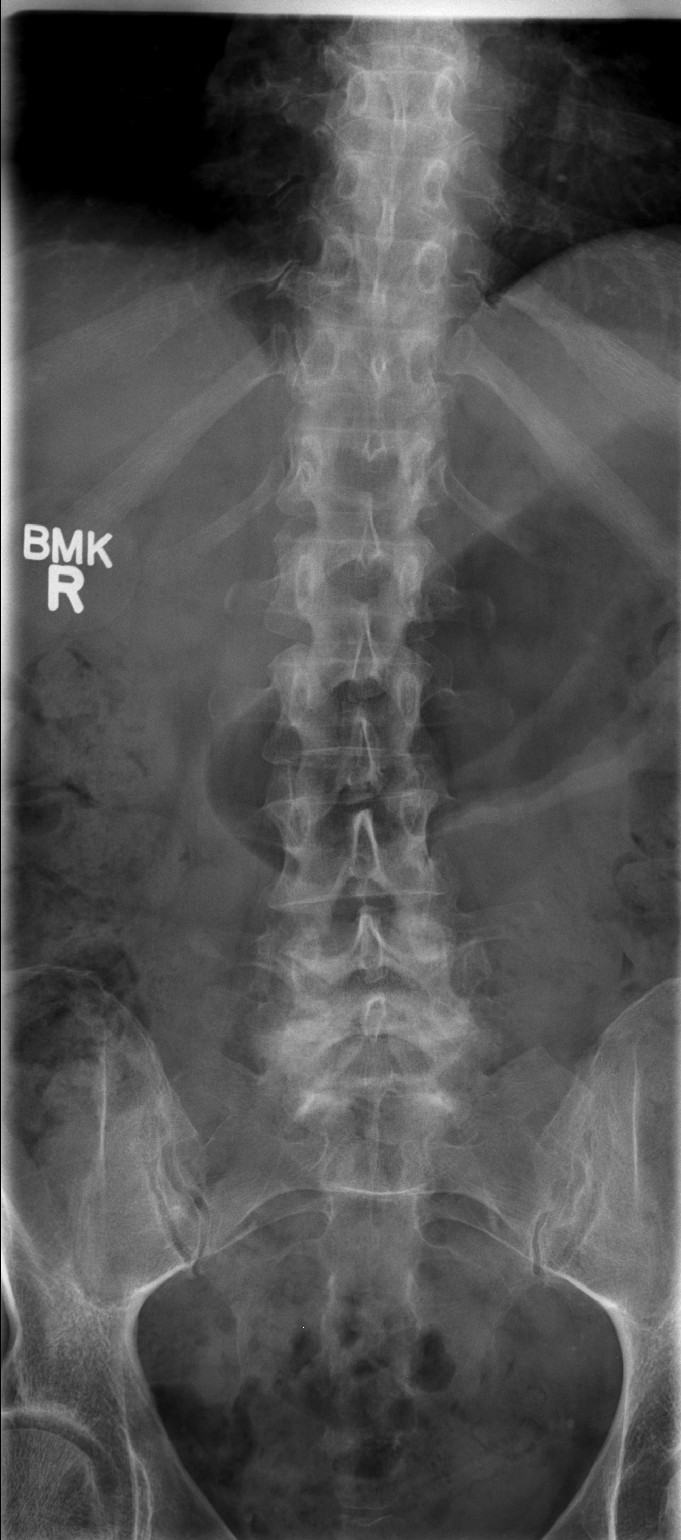
[im 2/3]
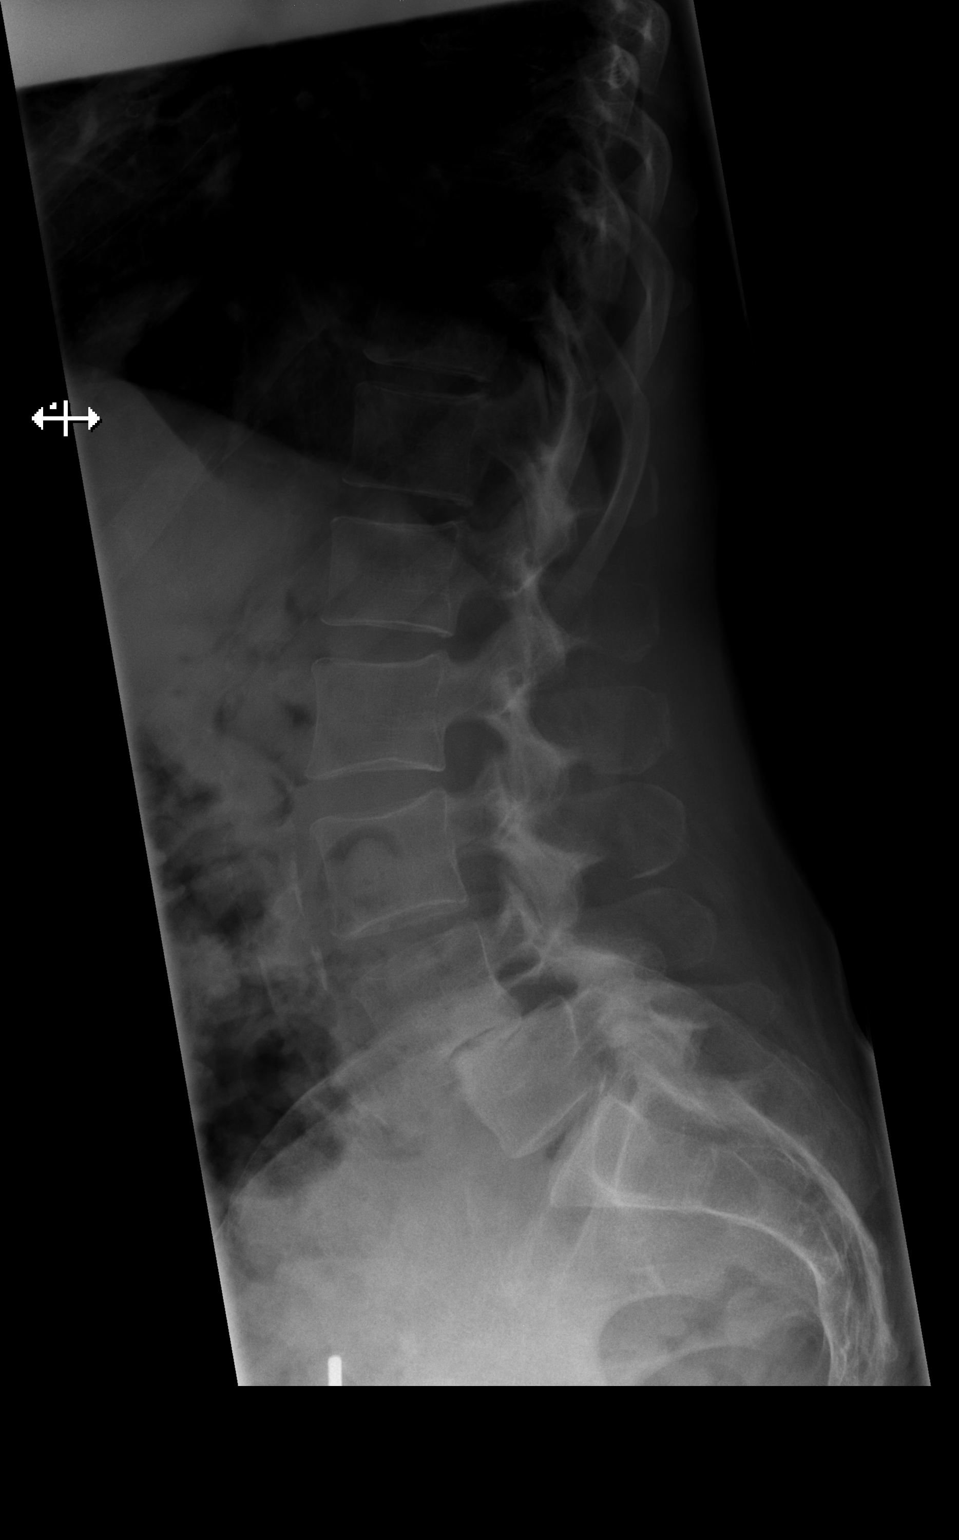
[im 3/3]
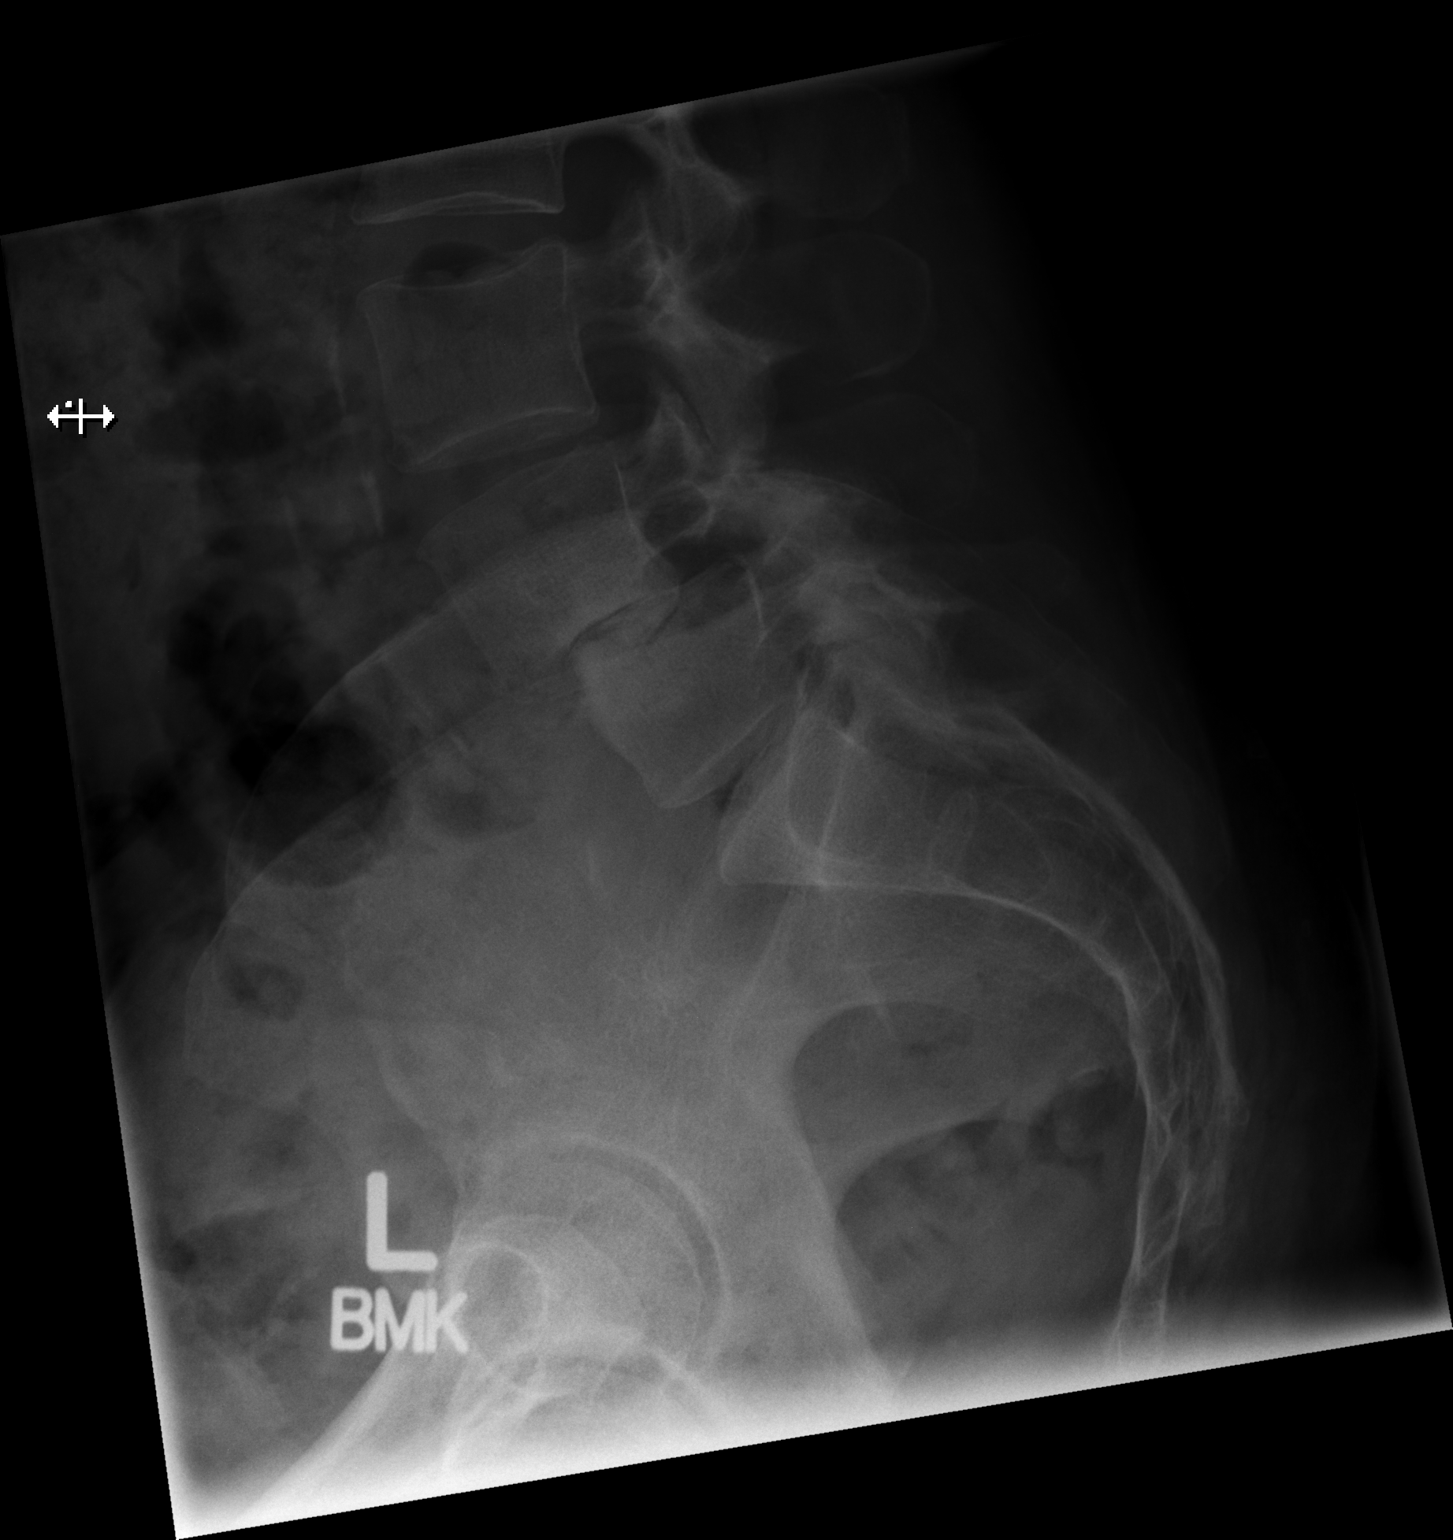

[3 of 3 positions shown; findings below may reference images not displayed]

FINDINGS: There is no evidence of lumbar spine fracture. There is grade 2
anterolisthesis of L4 on L5, which is stable when compared to the
radiograph dated 08/12/2015. Osteoarthritic changes with effacement
of the disc space at the same level are noted. Atherosclerotic
calcifications of the aorta are noted.
IMPRESSION: No acute fracture or dislocation identified about the lumbosacral
spine.

Chronic grade 2 anterolisthesis and osteoarthritic changes at L4-L5.

Calcified atherosclerotic disease of the abdominal aorta.

## 2016-05-14 NOTE — Telephone Encounter (Signed)
Pt  called states that she need 2 pills for yeast infection  [phone]

## 2016-05-14 NOTE — Telephone Encounter (Signed)
Attempted to call patient. She had previously hung up the phone when nursing staff placed her on hold.

## 2016-05-14 NOTE — Telephone Encounter (Signed)
Please call to triage symptoms. I don't typically treat a yeast infection without seeing the patient unless they have certain conditions- recent abx use, on a medication that causes yeast infections.  I typically recommend OTC treatment kids.

## 2016-05-19 NOTE — Telephone Encounter (Signed)
Called to triage symptoms-  Symptoms have improved. No current discharge, spotting. Thinks it was toilet paper related. Symptoms have stopped.   Getting sweaty when she gets up and does anything. Feeling very irritable. She has been taken off her Xanax due interaction with diazepam.  Informed patient it is likely withdrawal from xanax. She disagreed. Told pt she might need to be seen to work-up symptoms. She stated she cannot come today due to husband appt. Asked to make an appt later in the week- she states she will resolve her Xanax issue- if symptoms persist, she will make appt.

## 2016-05-22 DIAGNOSIS — Y999 Unspecified external cause status: Secondary | ICD-10-CM | POA: Insufficient documentation

## 2016-05-22 DIAGNOSIS — S300XXA Contusion of lower back and pelvis, initial encounter: Secondary | ICD-10-CM | POA: Insufficient documentation

## 2016-05-22 DIAGNOSIS — N189 Chronic kidney disease, unspecified: Secondary | ICD-10-CM | POA: Diagnosis not present

## 2016-05-22 DIAGNOSIS — F17210 Nicotine dependence, cigarettes, uncomplicated: Secondary | ICD-10-CM | POA: Insufficient documentation

## 2016-05-22 DIAGNOSIS — Y929 Unspecified place or not applicable: Secondary | ICD-10-CM | POA: Diagnosis not present

## 2016-05-22 DIAGNOSIS — Y939 Activity, unspecified: Secondary | ICD-10-CM | POA: Diagnosis not present

## 2016-05-22 DIAGNOSIS — W01190A Fall on same level from slipping, tripping and stumbling with subsequent striking against furniture, initial encounter: Secondary | ICD-10-CM | POA: Insufficient documentation

## 2016-05-22 DIAGNOSIS — E039 Hypothyroidism, unspecified: Secondary | ICD-10-CM | POA: Diagnosis not present

## 2016-05-22 DIAGNOSIS — J449 Chronic obstructive pulmonary disease, unspecified: Secondary | ICD-10-CM | POA: Insufficient documentation

## 2016-05-22 DIAGNOSIS — G8929 Other chronic pain: Secondary | ICD-10-CM | POA: Insufficient documentation

## 2016-05-22 DIAGNOSIS — S3992XA Unspecified injury of lower back, initial encounter: Secondary | ICD-10-CM | POA: Diagnosis present

## 2016-05-22 DIAGNOSIS — Z79899 Other long term (current) drug therapy: Secondary | ICD-10-CM | POA: Insufficient documentation

## 2016-05-22 DIAGNOSIS — Z7984 Long term (current) use of oral hypoglycemic drugs: Secondary | ICD-10-CM | POA: Diagnosis not present

## 2016-05-22 NOTE — ED Triage Notes (Signed)
EMS pt to triage via wheelchair. Pt reports she tripped over her cat and fell backwards hitting her back on a table. Pt reports she had low back surgery on 03/24/16 Pt reports she hit her back on the area that had surgery.

## 2016-05-23 ENCOUNTER — Emergency Department: Payer: Medicaid Other

## 2016-05-23 ENCOUNTER — Emergency Department
Admission: EM | Admit: 2016-05-23 | Discharge: 2016-05-23 | Disposition: A | Discharge status: Home / Self Care | Payer: Medicaid Other | Attending: Emergency Medicine | Admitting: Emergency Medicine

## 2016-05-23 DIAGNOSIS — G8929 Other chronic pain: Secondary | ICD-10-CM

## 2016-05-23 DIAGNOSIS — R52 Pain, unspecified: Secondary | ICD-10-CM

## 2016-05-23 DIAGNOSIS — M549 Dorsalgia, unspecified: Secondary | ICD-10-CM

## 2016-05-23 DIAGNOSIS — M545 Low back pain, unspecified: Secondary | ICD-10-CM

## 2016-05-23 MED ORDER — MORPHINE SULFATE (PF) 2 MG/ML IV SOLN
2.0000 mg | Freq: Once | INTRAVENOUS | Status: AC
  Administered 2016-05-23: 2 mg via INTRAMUSCULAR
  Filled 2016-05-23: qty 1
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
## 2016-05-23 NOTE — ED Notes (Signed)
Pts husband to front desk to inquire about wait time.  Informed him that pt was currently longest wait, that currently no beds available and that pts XRs had not resulted.  Pts husband returned to [place].

## 2016-05-23 NOTE — ED Notes (Signed)
Discharge instructions reviewed with patient. Patient verbalized understanding. Patient ambulated to lobby without difficulty.   

## 2016-05-23 NOTE — Discharge Instructions (Signed)
You may take the oxycodone and alprazolam that you have at home for pain. Please contact your doctors for further pain management. Return to the ER for worsening symptoms, losing control of bowel or bladder, difficulty breathing or other concerns.
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
## 2016-05-31 ENCOUNTER — Other Ambulatory Visit: Payer: Self-pay | Admitting: Family Medicine

## 2016-05-31 DIAGNOSIS — R7303 Prediabetes: Secondary | ICD-10-CM

## 2016-06-02 IMAGING — RF DG C-ARM 61-120 MIN
1 series · 2 of 2 positions shown · non-contrast
Comparison: None.

FLUOROSCOPY TIME:  4.6 seconds.

CLINICAL DATA: Anterior cervical disc fusion of C4-5, C5-6 and
C6-7.

EXAM:
CERVICAL SPINE - 2-3 VIEW; DG C-ARM 61-120 MIN

[Series 1: run · 2 of 2 slices shown]
[im 1/2]
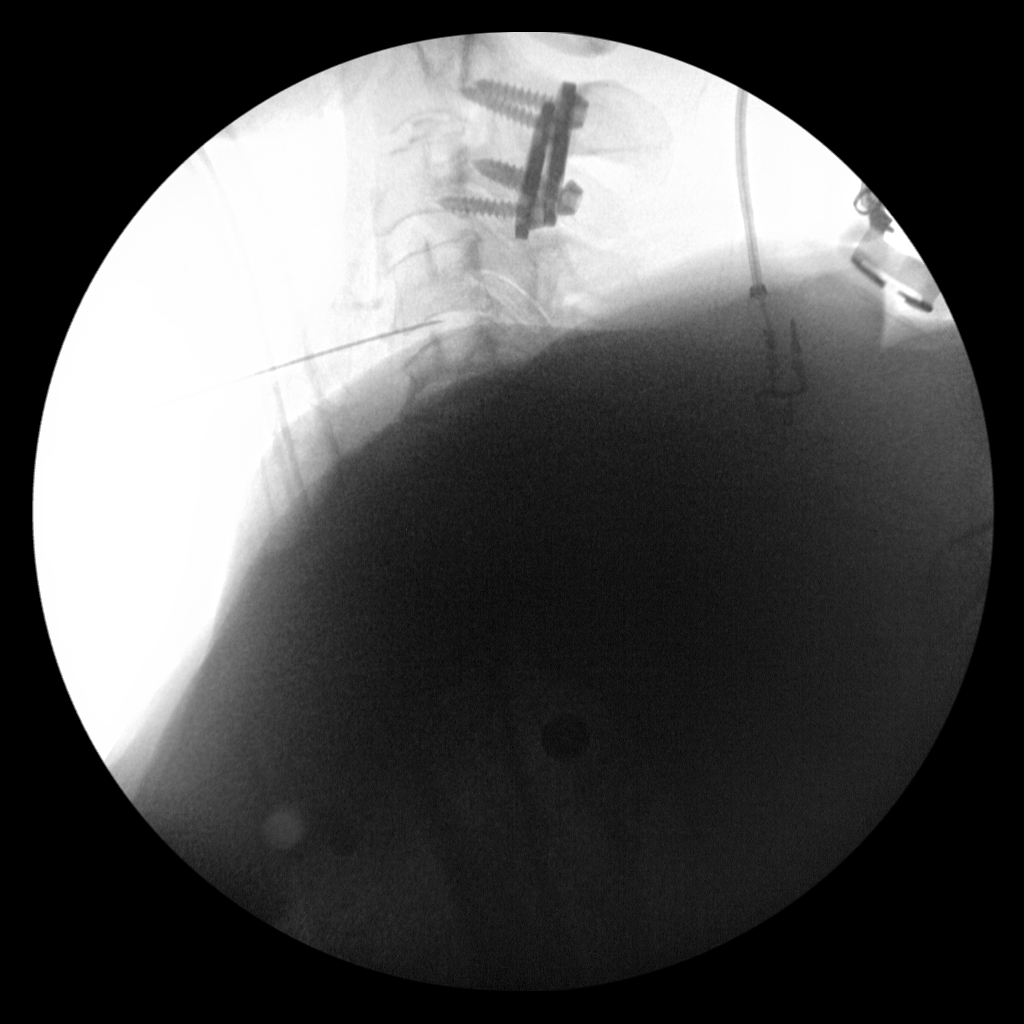
[im 2/2]
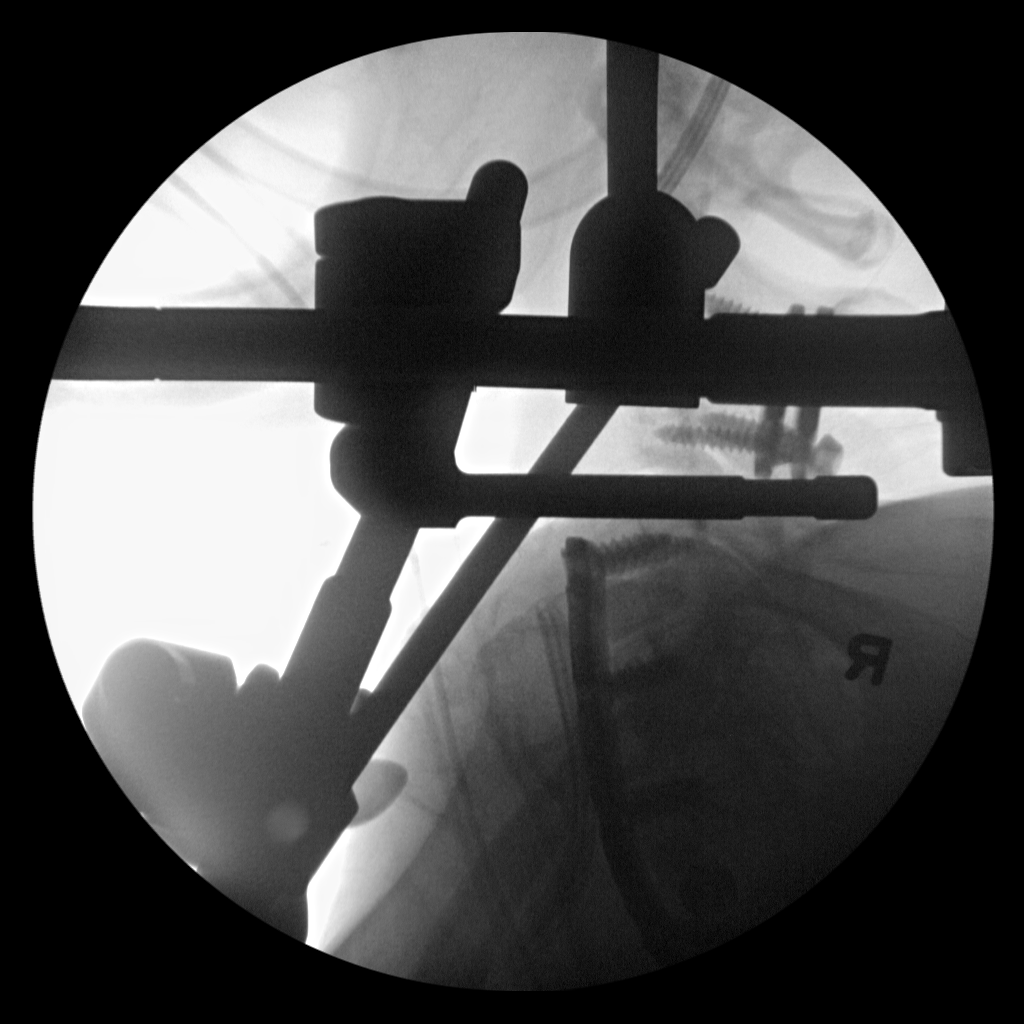

[2 of 2 positions shown; findings below may reference images not displayed]

FINDINGS: Two intraoperative fluoroscopic images of the cervical spine were
obtained. First image demonstrates surgical probe indicating the
C4-5 disc space. Second image demonstrates patient be status post
surgical anterior fusion of C4 through C7. Visualization is limited
due to overlying surgical hardware.
IMPRESSION: Status post surgical anterior fusion as described above.

## 2016-06-05 ENCOUNTER — Other Ambulatory Visit: Payer: Self-pay | Admitting: Family Medicine

## 2016-06-05 ENCOUNTER — Encounter: Payer: Self-pay | Admitting: Family Medicine

## 2016-06-05 ENCOUNTER — Ambulatory Visit (INDEPENDENT_AMBULATORY_CARE_PROVIDER_SITE_OTHER): Payer: Medicaid Other | Admitting: Family Medicine

## 2016-06-05 VITALS — BP 109/78 | HR 95 | Temp 98.4°F | Resp 16 | Ht 60.0 in | Wt 141.0 lb

## 2016-06-05 DIAGNOSIS — J432 Centrilobular emphysema: Secondary | ICD-10-CM

## 2016-06-05 DIAGNOSIS — K921 Melena: Secondary | ICD-10-CM | POA: Diagnosis not present

## 2016-06-05 DIAGNOSIS — K623 Rectal prolapse: Secondary | ICD-10-CM

## 2016-06-05 DIAGNOSIS — R7303 Prediabetes: Secondary | ICD-10-CM

## 2016-06-05 DIAGNOSIS — R6889 Other general symptoms and signs: Secondary | ICD-10-CM

## 2016-06-05 LAB — POCT GLYCOSYLATED HEMOGLOBIN (HGB A1C): Hemoglobin A1C: 6.1

## 2016-06-05 MED ORDER — ALBUTEROL SULFATE HFA 108 (90 BASE) MCG/ACT IN AERS: 2.0000 | INHALATION_SPRAY | Freq: Four times a day (QID) | RESPIRATORY_TRACT | 11 refills | Status: DC | PRN

## 2016-06-05 MED ORDER — MOMETASONE FUROATE 50 MCG/ACT NA SUSP: NASAL | 11 refills | Status: DC

## 2016-06-05 NOTE — Progress Notes (Signed)
Subjective:    Patient ID: Jamie Schultz, female    DOB: [birth year], 55 y.o.   MRN: [id number]  HPI: Jamie Schultz is a 55 y.o. female presenting on 06/05/2016 for Prediabetes   HPI  Pt presents for follow of prediabetes. She was started on Metformin in the end of August for A1c of 6.3% found in the hospital. A1c today is 6.1%- good result with 1 mos of medication. She has been cleared to walk only after her surgery. Feels like the metformin makes her feel funky. She drinks mountain dew daily- drinks 2 20oz glasses per day. Does not drink water.   COPDHad quit smoking but restarted. Smoking 1//4 pack per day. Taking Dulera daily. Has ran out of albuterol inhaler.  Pt needs new referral for GI as she owes [facility] $100 Still having blurred vision. Has not heard from opthalmology.  Needs refills on nascort Still seeing [facility] pain management.   At the end of the visit- pt was reading a pamphlet on the flu and reported to provider she thought she had the flu. She reports she has been dizzy, feeling poorly and had a fever last night. Reports all over body aches but this is her baseline. No current fever. No rhinorrhea. No SOB. No trouble breathing.   Past Medical History:  Diagnosis Date  . Abscess   . Allergy   . Anxiety   . Arthritis   . Blood transfusion without reported diagnosis   . Chronic back pain   . 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 Chronic kidney disease   . Congenital prolapsed rectum   . COPD (chronic obstructive pulmonary disease) ([place])   . Fibromyalgia   . Hepatitis C   . History of kidney stones   . Kidney failure    acute - reaction to sulfa drugs  . MRSA (methicillin resistant staph aureus) culture positive    Hx: of  . Muscle spasm   . Numbness   . Numbness and tingling of foot    Right  . Panic attack   . Pneumonia   . Shortness of breath dyspnea    WITH HUMITY  . Spinal stenosis    cervical region  . Wears dentures    full upper and lower     Current Outpatient Prescriptions on File Prior to Visit  Medication Sig  . alprazolam (XANAX) 2 MG tablet TAKE ONE TABLET BY MOUTH 3 TIMES DAILY AS NEEDED  . amphetamine-dextroamphetamine (ADDERALL) 15 MG tablet Take 1 tablet by mouth 2 (two) times daily.  . BELSOMRA 20 MG TABS TAKE ONE TABLET BY MOUTH ONCE NIGHTLY AS DIRECTED  . EPIPEN 2-PAK 0.3 MG/0.3ML SOAJ injection USE AS DIRECTED FOR ANAPYLACTIC REACTION (TO BEE STINGS)  . glucose blood (FREESTYLE TEST STRIPS) test strip Use as instructed  . glucose monitoring kit (FREESTYLE) monitoring kit Check blood glucose Monday, Wednesday, and Friday  . hydrOXYzine (ATARAX/VISTARIL) 25 MG tablet TAKE 1 TABLET (25 MG TOTAL) BY MOUTH EVERY 8 (EIGHT) HOURS AS NEEDED.  [name] ipratropium-albuterol (DUONEB) 0.5-2.5 (3) MG/3ML SOLN Take 3 mLs by nebulization every 6 (six) hours. (Patient taking differently: Take 3 mLs by nebulization 2 (two) times daily as needed. )  . Lancets (FREESTYLE) lancets Use as instructed  . LYRICA 100 MG capsule Take 1 capsule (100 mg total) by mouth 3 (three) times daily.  . mometasone-formoterol (DULERA) 200-5 MCG/ACT AERO Inhale 2 puffs into the lungs 2 (two) times daily.  . Multiple Vitamin (MULITIVITAMIN WITH MINERALS)  TABS Take 1 tablet by mouth daily.  [name] NICOTINE STEP 2 14 MG/24HR patch PLACE 1 PATCH ONTO SKIN DAILY  . Oxycodone HCl 10 MG TABS TAKE ONE TAB BY MOUTH 4 TIMES A DAY FOR 15 DAYS, THEN 1 TAB EVERY 4-6 HOURS AS NEEDED FOR 15 DAYS  . promethazine (PHENERGAN) 25 MG tablet Take 1 tablet (25 mg total) by mouth every 8 (eight) hours as needed for nausea or vomiting.  [name] QUEtiapine (SEROQUEL) 400 MG tablet take 2 tablets by mouth daily   No current facility-administered medications on file prior to visit.     Review of Systems  Constitutional: Negative for chills and fever.  HENT: Negative.   Respiratory: Negative for cough, chest tightness and wheezing.   Cardiovascular: Negative for chest pain and leg swelling.   Gastrointestinal: Positive for anal bleeding. Negative for abdominal pain, constipation, diarrhea, nausea and vomiting.  Endocrine: Negative.  Negative for cold intolerance, heat intolerance, polydipsia, polyphagia and polyuria.  Genitourinary: Negative for difficulty urinating and dysuria.  Musculoskeletal: Positive for back pain.  Neurological: Negative for dizziness, light-headedness and numbness.  Psychiatric/Behavioral: Negative for suicidal ideas. The patient is hyperactive.    Per HPI unless specifically indicated above     Objective:    BP 109/78   Pulse 95   Temp 98.4 F (36.9 C) (Oral)   Resp 16   Ht 5' (1.524 m)   Wt 141 lb (64 kg)   BMI 27.54 kg/m   Wt Readings from Last 3 Encounters:  06/05/16 141 lb (64 kg)  05/22/16 135 lb (61.2 kg)  04/24/16 137 lb 9.6 oz (62.4 kg)    Physical Exam  Constitutional: She is oriented to person, place, and time. She appears well-developed and well-nourished.  HENT:  Head: Normocephalic and atraumatic.  Neck: Neck supple.  Cardiovascular: Normal rate, regular rhythm and normal heart sounds.  Exam reveals no gallop and no friction rub.   No murmur heard. Pulmonary/Chest: Effort normal and breath sounds normal. She has no decreased breath sounds. She has no wheezes. She has no rhonchi. She has no rales. Chest wall is not dull to percussion. She exhibits no mass and no tenderness.  Abdominal: Soft. Normal appearance and bowel sounds are normal. She exhibits no distension and no mass. There is no tenderness. There is no rebound and no guarding.  Musculoskeletal: Normal range of motion. She exhibits no edema or tenderness.  Lymphadenopathy:    She has no cervical adenopathy.  Neurological: She is alert and oriented to person, place, and time.  Skin: Skin is warm and dry.  Psychiatric: Thought content normal. Her affect is labile. Her speech is rapid and/or pressured. Cognition and memory are normal. She expresses impulsivity.    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 Did not pick up refills of her inhaler- provided today. Completed prednisone for exacerbation in August. Breathing seems stable today. Believe she would benefit from pulmonology but patient left office AMA prior it being discussed.       Relevant Medications   mometasone (NASONEX) 50 MCG/ACT nasal spray   albuterol (PROVENTIL HFA;VENTOLIN HFA) 108 (90 Base) MCG/ACT inhaler     Digestive   Rectal prolapse  Relevant Orders   Ambulatory referral to General Surgery   Blood in stool    Pt unable to go to [facility] clinic GI due to balance. Have changed referral to Dr. Allen Norris. Pt aware to go to ER for severe rectal bleeding. Dizziness, SOB, or other concerning symptoms.       Relevant Orders   Ambulatory referral to General Surgery     Other   Prediabetes - Primary    Improvement on metformin. Pt would like to stop this medication. Have reviewed need to reduce sugar sweetened beverages such as juice, sweet tea, and soda. Pt plans to try and eliminate these beverages. Encouraged daily walking now that patient is cleared to ambulate by surgery.       Relevant Orders   POCT HgB A1C (Completed)    Other Visit Diagnoses    Flu-like symptoms       When asked to stay for a flu swab- pt reported she did not want to stay due to fear of her husband. She wanted [place] to call her with flu swab results. Informed patient that we like for patients to stay to for lab results and to discuss treatment. Pt reported she did not want flu swab and proceeded to walk out of the office. She verbalized understanding that this was against medical advice.  Pt encouraged to go to ER or urgent care if symptoms worsen.       Meds ordered this encounter  Medications   . mometasone (NASONEX) 50 MCG/ACT nasal spray    Sig: PLACE 2 SPRAYS EACH NOSTRIL EVERY DAY    Dispense:  17 g    Refill:  11    Order Specific Question:   Supervising Provider    Answer:   Arlis Porta [phone]  . albuterol (PROVENTIL HFA;VENTOLIN HFA) 108 (90 Base) MCG/ACT inhaler    Sig: Inhale 2 puffs into the lungs every 6 (six) hours as needed for wheezing or shortness of breath.    Dispense:  1 Inhaler    Refill:  11    Order Specific Question:   Supervising Provider    Answer:   Arlis Porta [phone]      Follow up plan: Return in about 3 months (around 09/05/2016), or if symptoms worsen or fail to improve, for Prediabetes. [name]

## 2016-06-05 NOTE — Assessment & Plan Note (Signed)
Started smoking again. Did not pick up refills of her inhaler- provided today. Completed prednisone for exacerbation in August. Breathing seems stable today. Believe she would benefit from pulmonology but patient left office AMA prior it being discussed.

## 2016-06-05 NOTE — Patient Instructions (Signed)
You may stop the metformin since it makes you feel poorly. But please stop drinking sugar sweetened beverages. Try to wean of any juice or soda. Drink mainly water. Watch portion sizes of breads, rice, and pasta.   I have placed a referral to Dr. Dorothey Baseman office for your rectal bleeding.   Please call [place]: [phone] Phone: [phone] FAX: [phone]

## 2016-06-05 NOTE — Assessment & Plan Note (Signed)
Improvement on metformin. Pt would like to stop this medication. Have reviewed need to reduce sugar sweetened beverages such as juice, sweet tea, and soda. Pt plans to try and eliminate these beverages. Encouraged daily walking now that patient is cleared to ambulate by surgery.

## 2016-06-05 NOTE — Progress Notes (Signed)
Called pharmacy and they will switch to brand.

## 2016-06-05 NOTE — Assessment & Plan Note (Signed)
Pt unable to go to [facility] clinic GI due to balance. Have changed referral to Dr. Allen Norris. Pt aware to go to ER for severe rectal bleeding. Dizziness, SOB, or other concerning symptoms.

## 2016-06-16 ENCOUNTER — Other Ambulatory Visit: Payer: Self-pay | Admitting: Family Medicine

## 2016-06-16 DIAGNOSIS — R110 Nausea: Secondary | ICD-10-CM

## 2016-06-16 MED ORDER — PROMETHAZINE HCL 25 MG PO TABS: 25.0000 mg | ORAL_TABLET | Freq: Two times a day (BID) | ORAL | 3 refills | Status: DC | PRN

## 2016-06-16 MED ORDER — HYDROXYZINE HCL 25 MG PO TABS: ORAL_TABLET | ORAL | 5 refills | Status: DC

## 2016-06-19 ENCOUNTER — Other Ambulatory Visit: Payer: Self-pay

## 2016-06-23 ENCOUNTER — Ambulatory Visit: Payer: Medicaid Other | Admitting: Gastroenterology

## 2016-07-07 ENCOUNTER — Telehealth: Payer: Self-pay | Admitting: *Deleted

## 2016-07-07 ENCOUNTER — Ambulatory Visit: Payer: Medicaid Other | Admitting: Neurology

## 2016-07-07 ENCOUNTER — Encounter: Payer: Self-pay | Admitting: Family Medicine

## 2016-07-07 DIAGNOSIS — M170 Bilateral primary osteoarthritis of knee: Secondary | ICD-10-CM | POA: Insufficient documentation

## 2016-07-07 NOTE — Telephone Encounter (Signed)
No showed follow up appointment. 

## 2016-07-20 ENCOUNTER — Ambulatory Visit: Payer: Medicaid Other | Admitting: Family Medicine

## 2016-07-27 ENCOUNTER — Ambulatory Visit (INDEPENDENT_AMBULATORY_CARE_PROVIDER_SITE_OTHER): Payer: Medicaid Other | Admitting: Family Medicine

## 2016-07-27 ENCOUNTER — Encounter: Payer: Self-pay | Admitting: Family Medicine

## 2016-07-27 ENCOUNTER — Ambulatory Visit
Admission: RE | Admit: 2016-07-27 | Discharge: 2016-07-27 | Disposition: A | Discharge status: Home / Self Care | Payer: Medicaid Other | Source: Ambulatory Visit | Attending: Family Medicine | Admitting: Family Medicine

## 2016-07-27 VITALS — BP 120/95 | HR 70 | Temp 98.7°F | Resp 16 | Ht 60.0 in | Wt 135.0 lb

## 2016-07-27 DIAGNOSIS — R7303 Prediabetes: Secondary | ICD-10-CM

## 2016-07-27 DIAGNOSIS — J432 Centrilobular emphysema: Secondary | ICD-10-CM | POA: Diagnosis not present

## 2016-07-27 DIAGNOSIS — J441 Chronic obstructive pulmonary disease with (acute) exacerbation: Secondary | ICD-10-CM | POA: Diagnosis present

## 2016-07-27 DIAGNOSIS — G8929 Other chronic pain: Secondary | ICD-10-CM

## 2016-07-27 DIAGNOSIS — M12812 Other specific arthropathies, not elsewhere classified, left shoulder: Secondary | ICD-10-CM

## 2016-07-27 DIAGNOSIS — M2550 Pain in unspecified joint: Secondary | ICD-10-CM

## 2016-07-27 DIAGNOSIS — M75102 Unspecified rotator cuff tear or rupture of left shoulder, not specified as traumatic: Secondary | ICD-10-CM

## 2016-07-27 MED ORDER — MELOXICAM 15 MG PO TABS: 15.0000 mg | ORAL_TABLET | Freq: Every day | ORAL | 2 refills | Status: DC

## 2016-07-27 MED ORDER — LEVOFLOXACIN 750 MG PO TABS: 750.0000 mg | ORAL_TABLET | Freq: Every day | ORAL | 0 refills | Status: DC

## 2016-07-27 MED ORDER — PREDNISONE 50 MG PO TABS: 50.0000 mg | ORAL_TABLET | Freq: Every day | ORAL | 0 refills | Status: DC

## 2016-07-27 MED ORDER — TIZANIDINE HCL 4 MG PO TABS: 2.0000 mg | ORAL_TABLET | Freq: Three times a day (TID) | ORAL | 0 refills | Status: DC | PRN

## 2016-07-27 NOTE — Progress Notes (Signed)
Subjective:    Patient ID: Jamie Schultz, female    DOB: 1961/04/18, 55 y.o.   MRN: [id number]  Jamie Schultz is a 55 y.o. female presenting on 07/27/2016 for Cough (pt has Hx of pneumonia and copd has hospitalized in past obtw shoulder pain think that she is not prediabetic onset month)  Patient presents for a same day appointment. Additionally husband is present for appointment today, and provides some history and requests information as well.  HPI   COPD EXACERBATION: - Today presents for worsening productive cough over past >1 week with concerns of COPD vs Pneumonia. She reports h/o Pneumonia 3 years ago, had significant illness required hospitalization and intubation in ICU, antibiotic treatment, she goes on to describe difficulty with laying in hospital bed for days and developed numbness and tingling in toes since that time, is followed by Neurology and Neurosurgery - No recent antibiotic or prednisone courses, last in 03/2016 - Taking Albuterol increased amount, also has nebulizer, using Dulera regularly, and has refills - Admits possible subjective low grade temp but not measured - Denies any fevers/chills, nausea, vomiting, chest pain, shortness of breath  CHRONIC PAIN SYNDROME / Fibromyalgia / Left Shoulder Rotator Cuff Arthropathy - Reports chronic history of pain from variety of causes including fibromyalgia, chronic spinal stenosis (s/p x 2 spinal surgeries in 2017 and pending one in 10/2016 per neurosurgery), also left shoulder chronic rotator cuff tear arthropathy, s/p arthroscopic surgery 3-4 years ago, she had previously established with ortho at [facility], then saw Dr Mardelle Matte [facility]) for variety of joint complaints including Left shoulder, and gets steroid injections in shoulder and knees, also synvisc in knees, next apt for knee injection in 2 days - She is followed by [facility] chronically, describes that they are adjusting her oxycodone, and she is  tapered down on xanax per psychiatry, also taking Lyrica with improvement (worsening when came off of this) - Today she is concerned about persistent muscle spasm and locking of her left shoulder, she understands the rotator cuff problem, but wants refill on Soma, this is only muscle relaxant that has helped her in the past, last rx given to her by last PCP Amy Krebs FNP here only for temporary supply not for long-term, but she and her husband today are insisting on refill  Pre Diabetes - Last two A1c 6.3 and 6.1 over past 4 months, she has not be diagnosed with DM but has her concerns about accuracy of this test and wants to re-test it today   Social History  Substance Use Topics  . Smoking status: Former Smoker    Packs/day: 0.25    Years: 15.00    Types: Cigarettes    Quit date: 01/13/2016  . Smokeless tobacco: Never Used  . Alcohol use No    Review of Systems Per HPI unless specifically indicated above     Objective:    BP (!) 120/95   Pulse 70   Temp 98.7 F (37.1 C) (Oral)   Resp 16   Ht 5' (1.524 m)   Wt 135 lb (61.2 kg)   SpO2 94%   BMI 26.37 kg/m   Wt Readings from Last 3 Encounters:  07/27/16 135 lb (61.2 kg)  06/05/16 141 lb (64 kg)  05/22/16 135 lb (61.2 kg)    Physical Exam  Constitutional: She is oriented to person, place, and time. She appears well-developed and well-nourished. No distress.  Well-appearing, comfortable, cooperative  HENT:  Head: Normocephalic  and atraumatic.  Mouth/Throat: Oropharynx is clear and moist.  Cardiovascular: Normal rate and intact distal pulses.   Pulmonary/Chest: Effort normal. No respiratory distress. She has wheezes (diffuse bilateral exp wheezes, scattered coarse sounds). She has rales (bilateral scattered coarse).  Speaks normal sentences without dyspnea or pausing. Frequent coughing, mild productive.  Good air movement overall.  Musculoskeletal: She exhibits no edema.  Left Shoulder Inspection: Left with asymmetry  compared to Right Palpation: mild tender to palpation over anterior, lateral, shoulder  ROM: Significant limited active ROM with reduced forward flexion above shoulder level and internal rotation, with stiffness of shoulder Special Testing: Rotator cuff testing is limited due to positive for pain and weakness, impingement testing positive   Neurological: She is alert and oriented to person, place, and time.  Skin: Skin is warm and dry. She is not diaphoretic.  Psychiatric: Her behavior is normal.  Anxious appearing, well groomed, normal speech and thought content  Nursing note and vitals reviewed.  Results for orders placed or performed in visit on 06/05/16  POCT HgB A1C  Result Value Ref Range   Hemoglobin A1C 6.1       Assessment & Plan:   Problem List Items Addressed This Visit    Prediabetes    Considered pre-DM, patient seems to have confusion on this, despite confirmed A1c x 2 results 6.3 and 6.1. Remains off metformin - Follow-up A1c in 2 months re-check, and discuss treatment / lifestyle      Left rotator cuff tear arthropathy    Subacute worsening of chronic L shoulder rotator cuff tendinopathy with some reduced active ROM and weakness with some concern for adhesive capsulitis, known prior multiple tears in rotator cuff muscles, last MRI 2014 details this (radiology report reviewed) - Checked [place] CSRS reviewed extensive list of controlled substances with multiple fills from several different providers is a red flag overall, and polypharmacy. No clear evidence of other substance use or diverting. Prior UDS appropriate.  Plan: 1. Limited other options at this point, she would likely need surgical intervention, already seems to be on max therapy with med management (Pain Management, opiates, prior failures on most muscle relaxants), long discussion regarding patient requesting soma rx, I counseled her on risks of this therapy with dependence, addiction and abuse, withdrawal  symptoms, not intended for chronic therapy especially in setting of opiates, benzodiazpine and sleeping agents, high risk combination, she continued to attempt bargaining with me (similar to last visits with prior PCP here at [facility]), she is not prescribed Soma or any muscle relaxant by Pain Management. I expressed to her that I do not plan to prescribe Soma at a short-term interval today and especially not for chronic management of pain for her. Mutual decision reached to try on higher dose Tizanidine in interval before she can get worked in to see Dr Noemi Chapel at Ortho for re-eval shoulder, rx Tizanidine 4mg  for 2-4mg  TID titration 2. Also start Meloxicam 15mg  daily after initial 5 day prednisone burst 3. Continue Tylenol PRN breakthrough, topical heating pad relief, ROM exercises 4. 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 Seems to be adherent to [facility],  interested in additional therapy. Unable to follow-up with Pulm at [place] due to finances, may benefit from other Pulm. - Treat underlying AECOPD - Follow-up with COPD maintenance, may add other therapy - Smoking cessation counseling, seems to now still be smoke free but still 2nd hand smoke husband      Relevant Medications   predniSONE (DELTASONE) 50 MG tablet   Other Relevant Orders   DG Chest 2 View (Completed)   Chronic pain of multiple joints    See A&P today for left shoulder rotator cuff tendinopathy for discussion on chronic pain medications and my concerns with her multiple controlled substances polypharmacy.      Relevant Medications   tiZANidine (ZANAFLEX) 4 MG tablet     predniSONE (DELTASONE) 50 MG tablet   meloxicam (MOBIC) 15 MG tablet    Other Visit Diagnoses    COPD with acute exacerbation ([place])    -  Primary  Consistent with mild acute exacerbation of COPD with worsening productive cough. Similar to prior exacerbations. - No hypoxia (94% on RA), afebrile, no recent hospitalization - Continues Albuterol, Dulera, has refills  Plan: 1. Start Prednisone 50mg  x 5 day steroid burst 2. Levaquin 750mg  x 5 days 3. Check CXR 2v today - reviewed results unremarkable without acute infiltrate or cardiopulmonary problem, seen thoracic DJD, results released to patient 4. Use albuterol q 4 hr regularly x 2-3 days. Continue maintenance inhalers  Return criteria reviewed, follow-up if not improving when to go to ED, future follow-up for COPD maintenance     Relevant Medications   levofloxacin (LEVAQUIN) 750 MG tablet   predniSONE (DELTASONE) 50 MG tablet   Other Relevant Orders   DG Chest 2 View (Completed)      Meds ordered this encounter  Medications  . tiZANidine (ZANAFLEX) 4 MG tablet    Sig: Take 0.5-1 tablets (2-4 mg total) by mouth every 8 (eight) hours as needed for muscle spasms.    Dispense:  60 tablet    Refill:  0  . levofloxacin (LEVAQUIN) 750 MG tablet    Sig: Take 1 tablet (750 mg total) by mouth daily. For 5 days    Dispense:  5 tablet    Refill:  0  . predniSONE (DELTASONE) 50 MG tablet    Sig: Take 1 tablet (50 mg total) by mouth daily with breakfast. For 5 days    Dispense:  5 tablet    Refill:  0  . meloxicam (MOBIC) 15 MG tablet    Sig: Take 1 tablet (15 mg total) by mouth daily.    Dispense:  30 tablet    Refill:  2      Follow up plan: Return in about 2 months (around 09/26/2016) for Pre diabetes, COPD.  Nobie Putnam, [facility] 07/27/2016, 7:34 PM

## 2016-07-27 NOTE — Patient Instructions (Addendum)
Thank you for coming in to clinic today.  1. It sounds like you had an Upper Respiratory Virus that has settled into a Bronchitis. I do hear wheezing and coarse breath sounds, this is most likely COPD Exacerbation - Check Chest X-ray today, will notify you of results within next 24 hours - Start Levaquin 750mg  antibiotic once daily for 5 days, this will stay in system for up to 10 days to help treat infection, you are covered for both COPD and pneumonia on this dose, even if chest x-ray shows pneumonia - Start Prednisone 50mg  daily for next 5 days - this will open up lungs allow you to breath better and treat that wheezing or bronchospasm - Use Albuterol inhaler 2 puffs every 4-6 hours around the clock for next 2-3 days, max up to 5 days then use as needed  - Refills for Albuterol and Dulera as needed - Start OTC Mucinex for 1 week or less, to help clear the mucus then stop - Use nasal saline (Simply Saline or Ocean Spray) to flush nasal congestion multiple times a day, may help cough - Drink plenty of fluids to improve congestion  If your symptoms seem to worsen instead of improve over next several days, with worsening shortness of breath, fevers chills, unable to take antibiotics or using inhalers too much without improvement, then next step is to go to Hospital ED for further evaluation, may need breathing treatments or IV antibiotics  For Left shoulder, referral placed to Dr Noemi Chapel, stay tuned for an appointment from them.  Start Tizanidine 4 mg tabs up to 3 times a day (you can cut in half to start if too strong), keep taking stool softener, stay well hydrated  Start Meloxicam 15mg  daily (AFTER PREDNISONE), do not take with ibuprofen, aleve, advil.  Recommend to start taking Tylenol Extra Strength 500mg  tabs - take 1 to 2 tabs per dose (max 1000mg ) every 6-8 hours for pain (take regularly, don't skip a dose for next 7 days), max 24 hour daily dose is 6 tablets or 3000mg . In the future you  can repeat the same everyday Tylenol course for 1-2 weeks at a time.  - This is safe to take with anti-inflammatory medicines (Meloxicam, Mobic)  Continue follow-up other specialists as planned.  Please schedule a follow-up appointment with Dr. Parks Ranger in 2 months to follow-up Pre-Diabetes A1c, COPD inhalers  Also we are working on hiring new replacement for Amy Krebs, stay tuned in near future for this update  If you have any other questions or concerns, please feel free to call the clinic or send a message through [place]. You may also schedule an earlier appointment if necessary.  Nobie Putnam, DO [place]

## 2016-07-27 NOTE — Assessment & Plan Note (Signed)
Considered pre-DM, patient seems to have confusion on this, despite confirmed A1c x 2 results 6.3 and 6.1. Remains off metformin - Follow-up A1c in 2 months re-check, and discuss treatment / lifestyle

## 2016-07-27 NOTE — Assessment & Plan Note (Signed)
See A&P today for left shoulder rotator cuff tendinopathy for discussion on chronic pain medications and my concerns with her multiple controlled substances polypharmacy.

## 2016-07-27 NOTE — Assessment & Plan Note (Addendum)
Subacute worsening of chronic L shoulder rotator cuff tendinopathy with some reduced active ROM and weakness with some concern for adhesive capsulitis, known prior multiple tears in rotator cuff muscles, last MRI 2014 details this (radiology report reviewed) - Checked [place] CSRS reviewed extensive list of controlled substances with multiple fills from several different providers is a red flag overall, and polypharmacy. No clear evidence of other substance use or diverting. Prior UDS appropriate.  Plan: 1. Limited other options at this point, she would likely need surgical intervention, already seems to be on max therapy with med management (Pain Management, opiates, prior failures on most muscle relaxants), long discussion regarding patient requesting soma rx, I counseled her on risks of this therapy with dependence, addiction and abuse, withdrawal symptoms, not intended for chronic therapy especially in setting of opiates, benzodiazpine and sleeping agents, high risk combination, she continued to attempt bargaining with me (similar to last visits with prior PCP here at [facility]), she is not prescribed Soma or any muscle relaxant by Pain Management. I expressed to her that I do not plan to prescribe Soma at a short-term interval today and especially not for chronic management of pain for her. Mutual decision reached to try on higher dose Tizanidine in interval before she can get worked in to see Dr Noemi Chapel at Ortho for re-eval shoulder, rx Tizanidine 4mg  for 2-4mg  TID titration 2. Also start Meloxicam 15mg  daily after initial 5 day prednisone burst 3. Continue Tylenol PRN breakthrough, topical heating pad relief, ROM exercises 4. Referral to [facility], Dr Noemi Chapel at [facility] office, may need repeat imaging, consider subacromial or other shoulder injection

## 2016-07-27 NOTE — Assessment & Plan Note (Signed)
Chronic problem, intermittent worsening with exac, today with acute exac. Seems to be adherent to [facility], interested in additional therapy. Unable to follow-up with Pulm at [place] due to finances, may benefit from other Pulm. - Treat underlying AECOPD - Follow-up with COPD maintenance, may add other therapy - Smoking cessation counseling, seems to now still be smoke free but still 2nd hand smoke husband

## 2016-07-28 ENCOUNTER — Encounter: Payer: Self-pay | Admitting: Neurology

## 2016-07-28 ENCOUNTER — Ambulatory Visit (INDEPENDENT_AMBULATORY_CARE_PROVIDER_SITE_OTHER): Payer: Medicaid Other | Admitting: Neurology

## 2016-07-28 ENCOUNTER — Telehealth: Payer: Self-pay | Admitting: *Deleted

## 2016-07-28 VITALS — BP 128/81 | HR 90 | Ht 60.0 in | Wt 139.0 lb

## 2016-07-28 DIAGNOSIS — R202 Paresthesia of skin: Secondary | ICD-10-CM | POA: Diagnosis not present

## 2016-07-28 DIAGNOSIS — R269 Unspecified abnormalities of gait and mobility: Secondary | ICD-10-CM | POA: Insufficient documentation

## 2016-07-28 DIAGNOSIS — G8929 Other chronic pain: Secondary | ICD-10-CM | POA: Diagnosis not present

## 2016-07-28 DIAGNOSIS — M546 Pain in thoracic spine: Secondary | ICD-10-CM | POA: Diagnosis not present

## 2016-07-28 NOTE — Telephone Encounter (Signed)
Patient requesting
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
## 2016-07-28 NOTE — Telephone Encounter (Addendum)
Patient is concerned about having NCV/EMG and MRI.  She requested a prescription for Valium 10mg , #4 tablets when she checked out today.  She is under the care of [facility] and has signed a narcotic agreement with them.  She is currently being treated with multiple controlled substances.  I returned her call and discuss this with her.  She will contact her pain management physician to discuss any further prescriptions of controlled medications. She was agreeable to this plan.

## 2016-08-05 ENCOUNTER — Other Ambulatory Visit: Payer: Self-pay | Admitting: Orthopedic Surgery

## 2016-08-05 DIAGNOSIS — M25512 Pain in left shoulder: Secondary | ICD-10-CM

## 2016-08-07 ENCOUNTER — Telehealth: Payer: Self-pay | Admitting: Neurology

## 2016-08-07 DIAGNOSIS — M549 Dorsalgia, unspecified: Secondary | ICD-10-CM

## 2016-08-07 NOTE — Telephone Encounter (Signed)
Called patient to check on the scheduling of her MRI. She requested it be sent to [facility] in [place].

## 2016-08-10 ENCOUNTER — Other Ambulatory Visit: Payer: Medicaid Other

## 2016-08-12 NOTE — Telephone Encounter (Signed)
I gave all the clinical information to Medicaid I am waiting to hear if it was approved or not

## 2016-08-13 ENCOUNTER — Ambulatory Visit: Payer: Medicaid Other | Attending: Orthopedic Surgery

## 2016-08-14 ENCOUNTER — Telehealth: Payer: Self-pay | Admitting: *Deleted

## 2016-08-14 ENCOUNTER — Ambulatory Visit (INDEPENDENT_AMBULATORY_CARE_PROVIDER_SITE_OTHER): Payer: Medicaid Other | Admitting: Family Medicine

## 2016-08-14 ENCOUNTER — Encounter: Payer: Self-pay | Admitting: Family Medicine

## 2016-08-14 VITALS — BP 142/90 | HR 92 | Temp 98.3°F | Resp 16 | Ht 60.0 in | Wt 138.0 lb

## 2016-08-14 DIAGNOSIS — M75102 Unspecified rotator cuff tear or rupture of left shoulder, not specified as traumatic: Secondary | ICD-10-CM

## 2016-08-14 DIAGNOSIS — J432 Centrilobular emphysema: Secondary | ICD-10-CM | POA: Diagnosis not present

## 2016-08-14 DIAGNOSIS — Z01818 Encounter for other preprocedural examination: Secondary | ICD-10-CM

## 2016-08-14 DIAGNOSIS — E559 Vitamin D deficiency, unspecified: Secondary | ICD-10-CM

## 2016-08-14 DIAGNOSIS — M12812 Other specific arthropathies, not elsewhere classified, left shoulder: Secondary | ICD-10-CM | POA: Diagnosis not present

## 2016-08-14 DIAGNOSIS — Z9189 Other specified personal risk factors, not elsewhere classified: Secondary | ICD-10-CM | POA: Diagnosis not present

## 2016-08-14 IMAGING — CT CT HEAD W/O CM
4 of 6 series · 22 of 47 positions shown, 24 images · non-contrast
Comparison: 11/07/2015

CLINICAL DATA: Several days ago with headaches and neck pain,
initial encounter, history of recent cervical surgery

EXAM:
CT HEAD WITHOUT CONTRAST
CT CERVICAL SPINE WITHOUT CONTRAST
TECHNIQUE: Multidetector CT imaging of the head and cervical spine was
performed following the standard protocol without intravenous
contrast. Multiplanar CT image reconstructions of the cervical spine
were also generated.

[Series 302: soft tissue, idose (2) · axial · 0.34mm/px · z∈[+72,+214]mm · 8 of 93 slices shown, 10 images]
[im 11/93  brain]
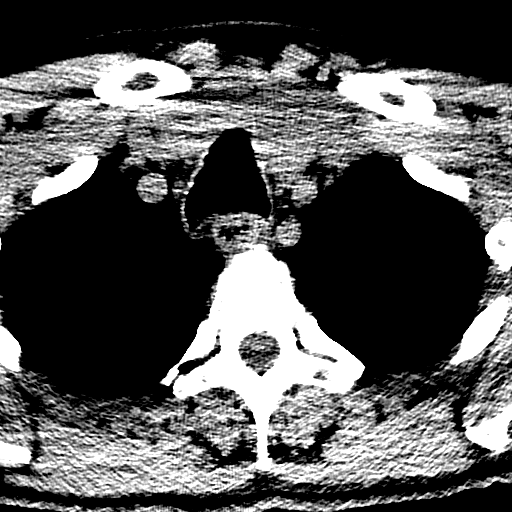
[im 11/93  bone]
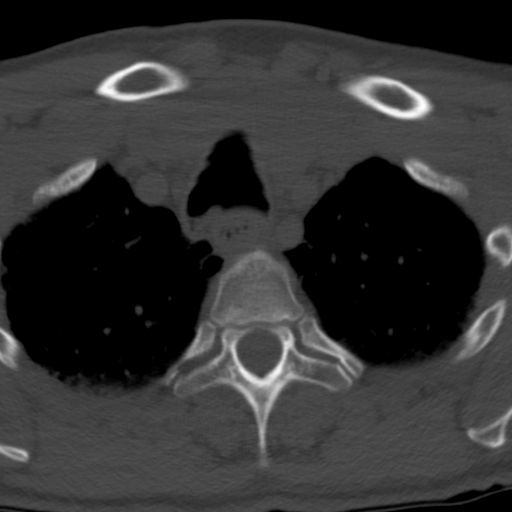
[im 21/93  brain]
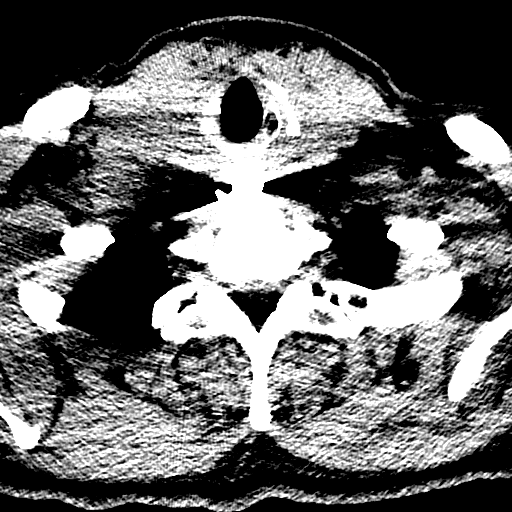
[im 31/93  brain]
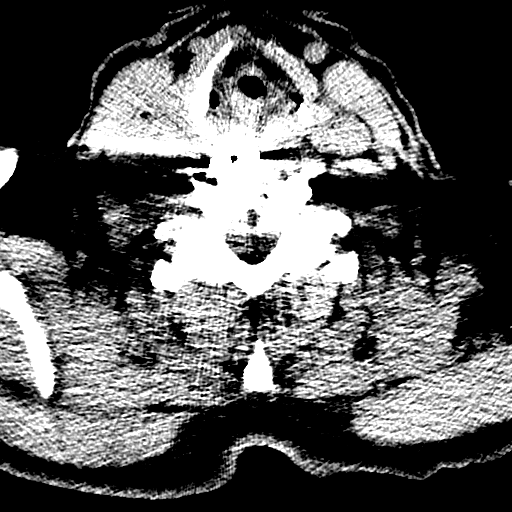
[im 41/93  brain]
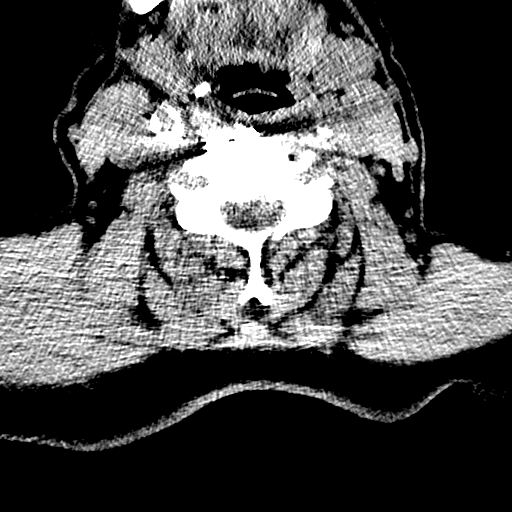
[im 52/93  brain]
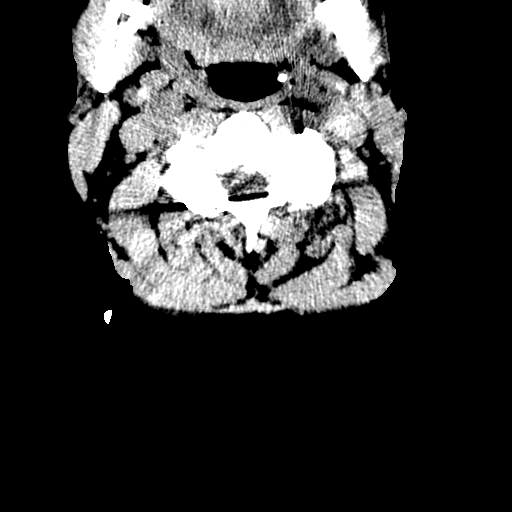
[im 52/93  bone]
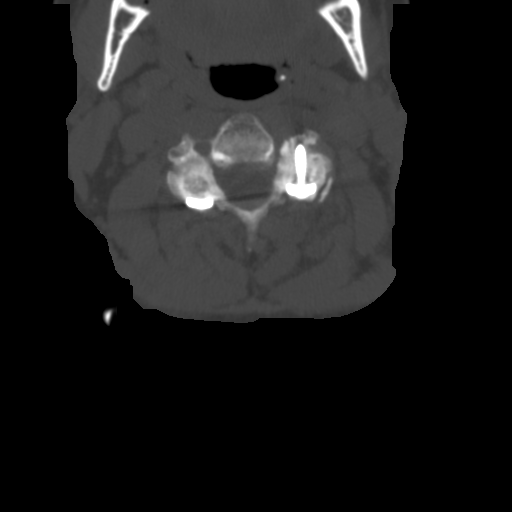
[im 62/93  brain]
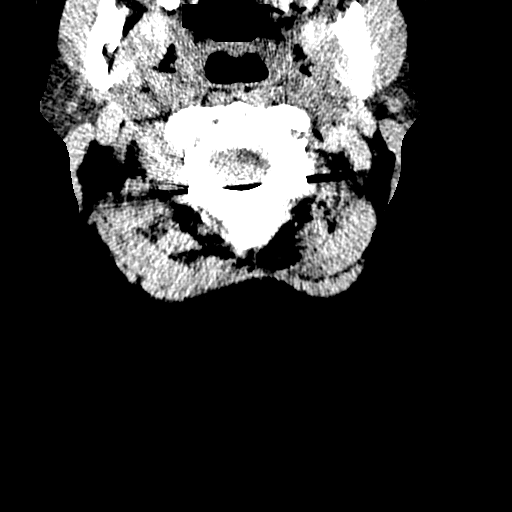
[im 72/93  brain]
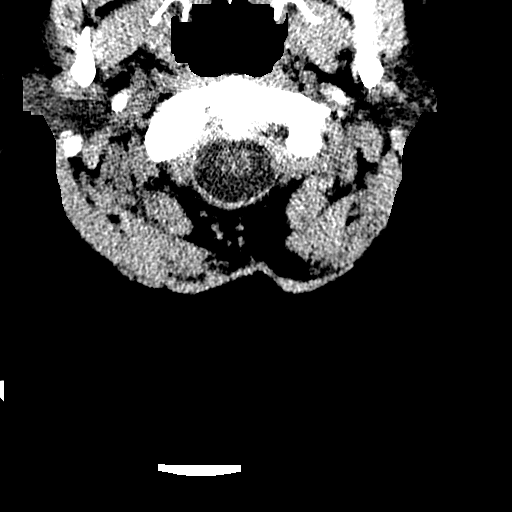
[im 82/93  brain]
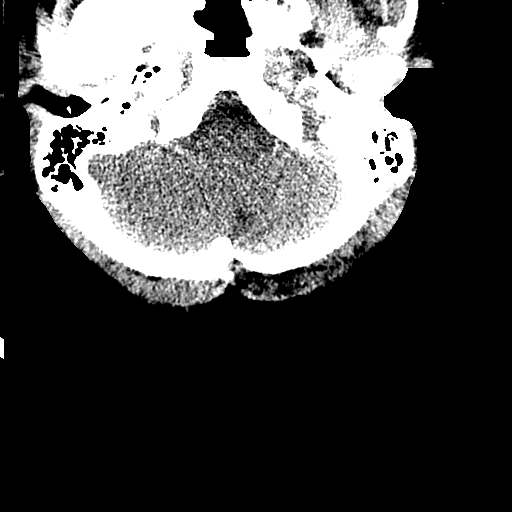

[Series 305: sagittal, idose (2) · sagittal · 0.34mm/px · 3 of 82 slices shown]
[im 28/82  brain]
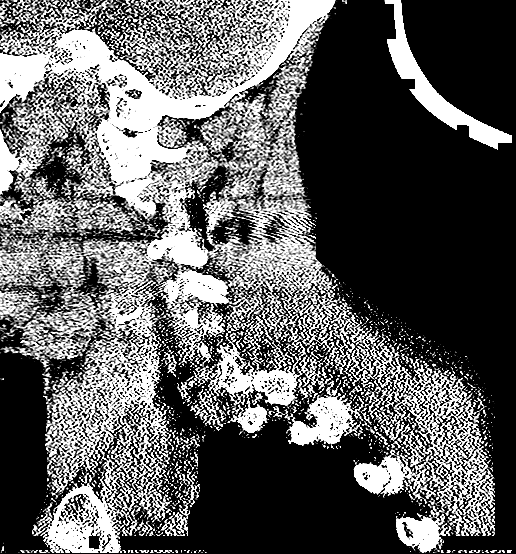
[im 41/82  brain]
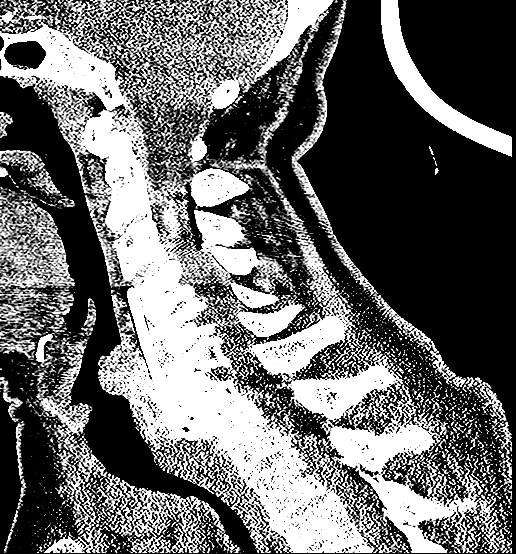
[im 55/82  brain]
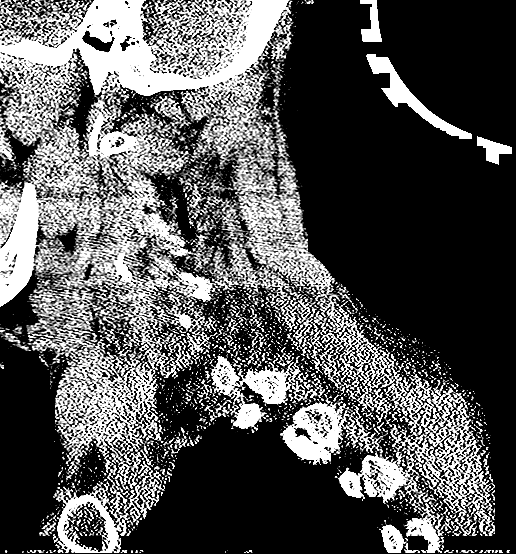

[Series 307: coronal, idose (2) · coronal · 0.38mm/px · 3 of 51 slices shown]
[im 17/51  brain]
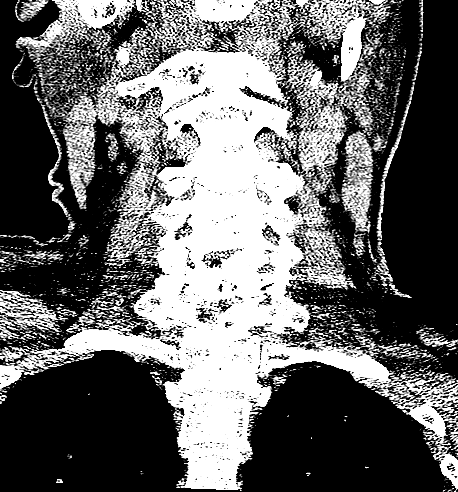
[im 23/51  brain]
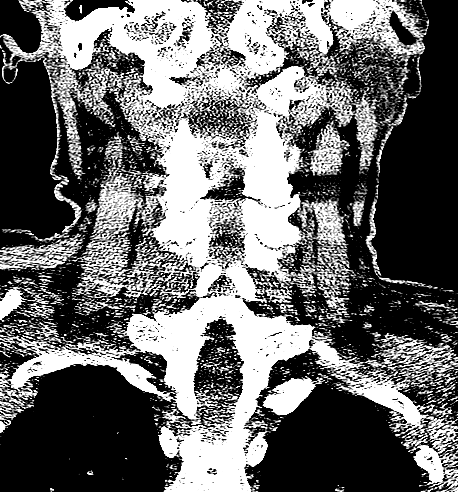
[im 28/51  brain]
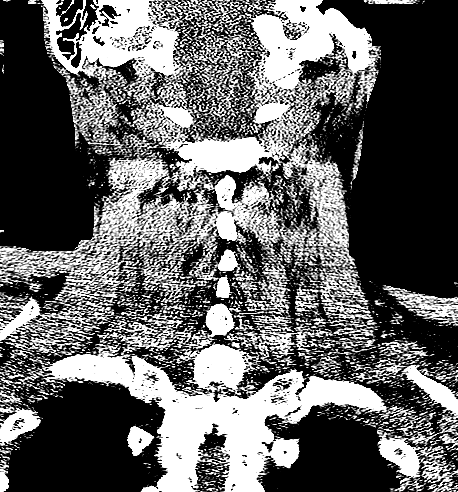

[Series 308: orthogonals, idose (2) · axial · 0.40mm/px · z∈[+47,+165]mm · 8 of 91 slices shown]
[im 11/91  brain]
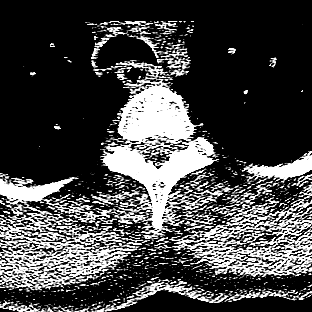
[im 21/91  brain]
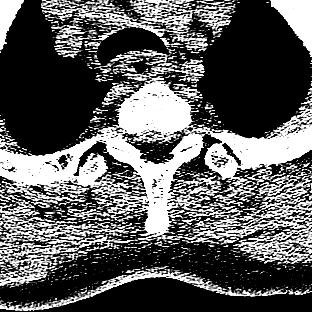
[im 31/91  brain]
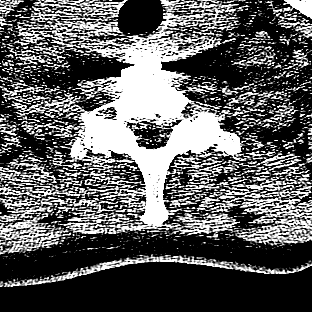
[im 41/91  brain]
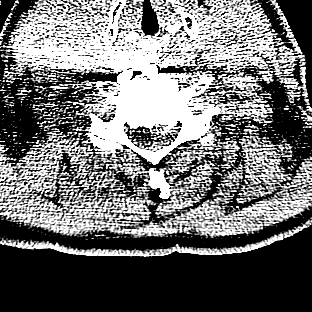
[im 51/91  brain]
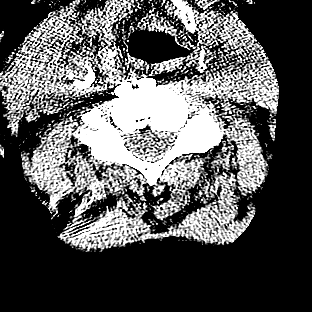
[im 61/91  brain]
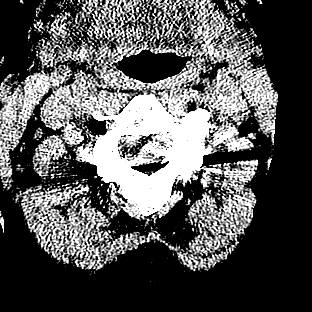
[im 71/91  brain]
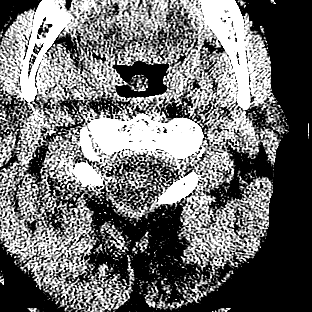
[im 81/91  brain]
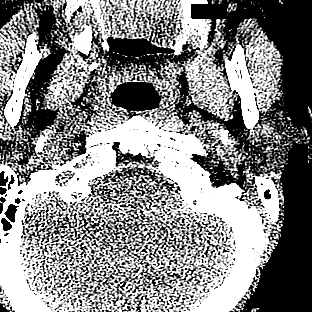

[22 of 47 positions shown; findings below may reference images not displayed]

FINDINGS: CT HEAD FINDINGS

Bony calvarium is intact. No findings to suggest acute hemorrhage,
acute infarction or space-occupying mass lesion are noted.

CT CERVICAL SPINE FINDINGS

Seven cervical segments are well visualized. There are changes
consistent with recent interbody fusion at C4-5, C5-6 and C6-7 with
anterior fixation. No hardware failure is noted. There is also
evidence of posterior fusion at C2-3. No hardware failure is noted.
No acute fracture is seen. Multilevel osteophytic changes are seen
with neural foraminal narrowing. The surrounding soft tissues show
no acute abnormality.
IMPRESSION: CT of the head:  No acute intracranial abnormality noted.

CT of the cervical spine: Multilevel postoperative changes. No acute
abnormality is seen.

## 2016-08-14 MED ORDER — IBANDRONATE SODIUM 150 MG PO TABS: ORAL_TABLET | ORAL | 11 refills | Status: DC

## 2016-08-14 NOTE — Assessment & Plan Note (Signed)
Followed by Dr Mardelle Matte (orthopedics), for chronic worsening L shoulder rotator cuff tear arthropathy  Plan: 1. Patient is considered medically cleared to proceed L shoulder total replacement surgery, reviewed labs from 03/2016 unremarkable, EKG repeated today is stable compared to 02/2016 with a normal variant V1 RSR, no known CAD, appropriate METs with activity without problem. Now non smoker. Will fax requested paperwork back to Dr Mardelle Matte office to proceed.

## 2016-08-14 NOTE — Patient Instructions (Signed)
Thank you for coming in to clinic today.  1. You are cleared to proceed with your Left rotator cuff shoulder surgery with Dr Mardelle Matte from a medical and cardiopulmonary stand point. - Continue regular inhaler treatments as you are - No change to medications today - EKG is stable and unchanged since 02/2016, lab work looks good from 03/2016, we will fax copies to Dr Mardelle Matte office  Please schedule a follow-up appointment with Dr. Parks Ranger 2 months to follow-up Pre-Diabetes A1c, COPD inhalers  If you have any other questions or concerns, please feel free to call the clinic or send a message through [place]. You may also schedule an earlier appointment if necessary.  Nobie Putnam, DO [place]

## 2016-08-14 NOTE — Addendum Note (Signed)
Addended by: Olin Hauser on: 08/14/2016 04:20 PM   Modules accepted: Orders

## 2016-08-14 NOTE — Assessment & Plan Note (Signed)
Stable, chronic problem. Resolved recent AECOPD RA 95% Continues Dulera Now remains smoke free from prior exacerbation Considered a risk factor for pre-operative pulmonary risk, however her COPD appears to be controlled, and without other factors would not prevent her from upcoming surgery.

## 2016-08-14 NOTE — Telephone Encounter (Signed)
-----   Message from Olin Hauser, DO sent at 08/14/2016  4:20 PM EST ----- Regarding: New rx Boniva and order DEXA Please notify patient that I have sent her rx for Boniva (for osteoporosis prevention) take 1 tablet monthly, given 1 year supply.  She is to take daily Calcium (800-1000mg  daily) and Vitamin D3 (2,000 daily) supplement over the counter as well.  Also ordered DEXA Scan to screen for osteoporosis, her last scan done 10/2012. She can call to schedule this at [facility].  She should schedule follow-up within next 6 months to discuss risk of osteoporosis further, and determine treatment course with Boniva.

## 2016-08-14 NOTE — Assessment & Plan Note (Signed)
Prior treatment with Boniva, had tolerated well. Last DEXA 10/2012, normal Proceed with Vitamin D, Ca supplement and start Boniva 150mg  monthly preventative dose, given 1 year supply, max 3-5 years total, then stop. Will follow-up with patient and arrange future DEXA scan

## 2016-08-14 NOTE — Progress Notes (Addendum)
Subjective:    Patient ID: Jamie Schultz, female    DOB: Jun 30, 1961, 55 y.o.   MRN: [id number]  Jamie Schultz is a 55 y.o. female presenting on 08/14/2016 for Medical Clearance   HPI   Left Shoulder Rotator Cuff Tear Arthropathy, Chronic - Reports chronic history of pain from variety of causes including fibromyalgia, and left shoulder chronic rotator cuff tear arthropathy, s/p arthroscopic surgery 3-4 years ago, she had previously established with ortho at [facility], then saw Dr Mardelle Matte [facility]). Recent history with Left shoulder cortisone joint injection only mild improvement. Last procedure on L shoulder per Dr Mardelle Matte 3 years ago with arthroscopic surgical procedure to remove some scar tissue, however this was only temporary at that time and she was advised to hold off as long as she could before doing rotator cuff repair surgery, would need total shoulder replacement - She has now followed back up with Dr Mardelle Matte, and plans to proceed with surgery (describes a reverse rotator cuff replacement with a titanium ball & socket replace for shoulder) - Presents here for pre-op clearance. States she has tolerated anesthesia well in past, last surgery 02/2016 and [id number] without complications or problems, she has no known CAD or cardiac disease. Primary risk factor is COPD and former smoker. Admits able to tolerate exercise and walking up stairs, without chest pain or shortness of breath. - Patient is Left handed, and shoulder is significantly limiting her, describes episodes of it popping or locking into place, and needs to be re-adjusted  FOLLOW-UP COPD Exacerbation - Resolved - Last seen by me on 07/27/16 for acute COPD exacerbation, treated with Levaquin and Prednisone - Today reports breathing is much improved, resolved productive cough, only occasional residual chronic coughing. - Using Dulera inhaler regularly, and Albuterol PRN - Denies any fevers/chills, nausea, vomiting, chest  pain, shortness of breath  History of Low Bone Mineral Density, without diagnosis of Osteoporosis - Additionally asks about taking Boniva again in future, she was on this before, unsure how many years or dose. Most recent DEXA Scan is normal bone density in 10/2012   Social History  Substance Use Topics  . Smoking status: Former Smoker    Packs/day: 0.25    Years: 15.00    Types: Cigarettes    Quit date: 01/13/2016  . Smokeless tobacco: Never Used  . Alcohol use No    Review of Systems Per HPI unless specifically indicated above     Objective:    BP (!) 142/90 (BP Location: Left Arm, Cuff Size: Normal)   Pulse 92   Temp 98.3 F (36.8 C) (Oral)   Resp 16   Ht 5' (1.524 m)   Wt 138 lb (62.6 kg)   SpO2 95%   BMI 26.95 kg/m   Wt Readings from Last 3 Encounters:  08/14/16 138 lb (62.6 kg)  07/28/16 139 lb (63 kg)  07/27/16 135 lb (61.2 kg)    Physical Exam  Constitutional: She appears well-developed and well-nourished. No distress.  Well-appearing, comfortable, cooperative  HENT:  Head: Normocephalic and atraumatic.  Mouth/Throat: Oropharynx is clear and moist.  Eyes: Conjunctivae are normal.  Neck: Normal range of motion. Neck supple. No thyromegaly present.  Cardiovascular: Normal rate, regular rhythm, normal heart sounds and intact distal pulses.   No murmur heard. Pulmonary/Chest: Effort normal and breath sounds normal. No respiratory distress. She has no wheezes. She has no rales.  Good air movement  Musculoskeletal: She exhibits no edema.  Left Shoulder Inspection:  Left with asymmetry compared to Right Palpation: mild tender to palpation over anterior, lateral, shoulder  ROM: Significant limited active ROM with reduced forward flexion above shoulder level and internal rotation, with stiffness of shoulder Did not repeat special testing or rotator cuff testing, given plan to proceed with orthopedic surgery.  Lymphadenopathy:    She has no cervical adenopathy.    Skin: Skin is warm and dry. She is not diaphoretic.  Nursing note and vitals reviewed.  Results for orders placed or performed in visit on 06/05/16  POCT HgB A1C  Result Value Ref Range   Hemoglobin A1C 6.1       Assessment & Plan:   Problem List Items Addressed This Visit    Vitamin D deficiency    Did not see old vitamin D lab check from 12/2015, patient asking about Jaclyn Prime, previously treated Last DEXA 10/2012, normal result  Plan: 1. Will follow-up with patient to discuss Boniva and repeat vitamin D testing, perhaps would do lower dose 150mg  once monthly for prevention of osteoporosis in high risk patient in future, and will need Ca and Vit D supplement, also will need updated DEXA as well.      Left rotator cuff tear arthropathy    Followed by Dr Mardelle Matte (orthopedics), for chronic worsening L shoulder rotator cuff tear arthropathy  Plan: 1. Patient is considered medically cleared to proceed L shoulder total replacement surgery, reviewed labs from 03/2016 unremarkable, EKG repeated today is stable compared to 02/2016 with a normal variant V1 RSR, no known CAD, appropriate METs with activity without problem. Now non smoker. Will fax requested paperwork back to Dr Mardelle Matte office to proceed.      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 Resolved recent AECOPD RA 95% Continues Dulera Now remains smoke free from prior exacerbation Considered a risk factor for pre-operative pulmonary risk, however her COPD appears to be controlled, and without other factors would not prevent her from upcoming surgery.       Other Visit Diagnoses    Pre-op evaluation    -  Primary Patient is considered medically cleared to proceed L shoulder total replacement surgery, reviewed labs from 03/2016 unremarkable, EKG repeated today is stable compared to 02/2016 with a normal variant V1 RSR, no known CAD, appropriate METs with activity without problem. Now non smoker. Will fax requested paperwork  back to Dr Mardelle Matte office to proceed    Relevant Orders   EKG 12-Lead      Meds ordered this encounter  Medications  . ibandronate (BONIVA) 150 MG tablet    Sig: Take 1 tablet every 30 days in morning with full glass of water, on empty stomach (do not eat/drink or lie down for 30 min after)    Dispense:  4 tablet    Refill:  11      Follow up plan: Return in about 2 months (around 10/15/2016) for pre diabetes, COPD.  Nobie Putnam, DO [facility] 08/14/2016, 4:19 PM

## 2016-08-14 NOTE — Telephone Encounter (Signed)
Message left as stated below:

## 2016-08-14 NOTE — Assessment & Plan Note (Signed)
Did not see old vitamin D lab check from 12/2015, patient asking about Jaclyn Prime, previously treated Last DEXA 10/2012, normal result  Plan: 1. Will follow-up with patient to discuss Boniva and repeat vitamin D testing, perhaps would do lower dose 150mg  once monthly for prevention of osteoporosis in high risk patient in future, and will need Ca and Vit D supplement, also will need updated DEXA as well.

## 2016-08-17 NOTE — Addendum Note (Signed)
Addended by: Frederich Cha D on: 08/17/2016 10:13 AM   Modules accepted: Orders

## 2016-08-19 ENCOUNTER — Ambulatory Visit: Payer: Medicaid Other | Admitting: Family Medicine

## 2016-08-19 NOTE — Telephone Encounter (Signed)
I faxed more clinical notes to [facility] yesterday 08/18/16

## 2016-09-01 ENCOUNTER — Ambulatory Visit: Payer: Medicaid Other | Admitting: Family Medicine

## 2016-09-02 NOTE — Telephone Encounter (Signed)
I never heard anything back from [employer]. So I called [employer] to see what the status was. They stated that it was denied because the insurance company said to have a xray done first and or other medication or physical therapy.

## 2016-09-03 NOTE — Addendum Note (Signed)
Addended by: Marcial Pacas on: 09/03/2016 09:39 AM   Modules accepted: Orders

## 2016-09-03 NOTE — Telephone Encounter (Signed)
Please let patient know that I have ordered x-ray of thoracic spine to [facility]

## 2016-09-15 ENCOUNTER — Telehealth: Payer: Self-pay | Admitting: Neurology

## 2016-09-15 NOTE — Telephone Encounter (Signed)
Pt said states medicaid would not pay for MRI unless Dr Krista Blue sends in letter stating she has new pain in the mid back since having surgery on neck and lower back.

## 2016-09-15 NOTE — Telephone Encounter (Addendum)
Spoke to Jamie Schultz - Medicaid requested thoracic x-ray first - this order was placed in Epic on 09/03/16 by Dr. Krista Blue.  She has not had this done yet because she is dealing with a new shoulder issue.  She now has a pending shoulder surgery and may have to put her thoracic pain on hold.  Once she has the x-ray, if Dr. Krista Blue feels the MRI is necessary then her last office note (showing the worsening thoracic pain) will need to be sent to [facility] for review.

## 2016-09-18 ENCOUNTER — Encounter: Payer: Medicaid Other | Admitting: Neurology

## 2016-09-21 ENCOUNTER — Encounter (HOSPITAL_COMMUNITY): Payer: Self-pay

## 2016-09-21 ENCOUNTER — Encounter (HOSPITAL_COMMUNITY)
Admission: RE | Admit: 2016-09-21 | Discharge: 2016-09-21 | Disposition: A | Discharge status: Home / Self Care | Payer: Medicaid Other | Source: Ambulatory Visit | Attending: Orthopedic Surgery | Admitting: Orthopedic Surgery

## 2016-09-21 DIAGNOSIS — M19012 Primary osteoarthritis, left shoulder: Secondary | ICD-10-CM | POA: Diagnosis not present

## 2016-09-21 DIAGNOSIS — Z01812 Encounter for preprocedural laboratory examination: Secondary | ICD-10-CM | POA: Diagnosis not present

## 2016-09-21 HISTORY — DX: Attention-deficit hyperactivity disorder, unspecified type: F90.9

## 2016-09-21 HISTORY — DX: Other muscle spasm: M62.838

## 2016-09-21 LAB — BASIC METABOLIC PANEL
ANION GAP: 10 (ref 5–15)
BUN: 11 mg/dL (ref 6–20)
CALCIUM: 9.7 mg/dL (ref 8.9–10.3)
CO2: 24 mmol/L (ref 22–32)
Chloride: 106 mmol/L (ref 101–111)
Creatinine, Ser: 0.93 mg/dL (ref 0.44–1.00)
GFR calc Af Amer: >60 mL/min (ref >60)
GLUCOSE: 91 mg/dL (ref 65–99)
POTASSIUM: 4.1 mmol/L (ref 3.5–5.1)
SODIUM: 140 mmol/L (ref 135–145)

## 2016-09-21 LAB — CBC
HCT: 42.3 % (ref 36.0–46.0)
Hemoglobin: 14.1 g/dL (ref 12.0–15.0)
MCH: 30.1 pg (ref 26.0–34.0)
MCHC: 33.3 g/dL (ref 30.0–36.0)
MCV: 90.2 fL (ref 78.0–100.0)
PLATELETS: 360 10*3/uL (ref 150–400)
RBC: 4.69 MIL/uL (ref 3.87–5.11)
RDW: 15.4 % (ref 11.5–15.5)
WBC: 8.4 10*3/uL (ref 4.0–10.5)

## 2016-09-21 LAB — SURGICAL PCR SCREEN
MRSA, PCR: NEGATIVE
Staphylococcus aureus: NEGATIVE

## 2016-09-21 NOTE — Pre-Procedure Instructions (Signed)
    Jamie Schultz  09/21/2016      [id number] - [place], [place] - [address] [place] [address] Phone: [phone] Fax: [place], [address] [place] [address] Phone: [phone] Fax: [phone]    Your procedure is scheduled on 09/29/16.  Report to [facility] Admitting at 830 A.M.  Call this number if you have problems the morning of surgery:  [phone]   Remember:  Do not eat food or drink liquids after midnight.  Take these medicines the morning of surgery with A SIP OF WATER ---all inhalers,xanax,oxycodone,adderall   Do not wear jewelry, make-up or nail polish.  Do not wear lotions, powders, or perfumes, or deoderant.  Do not shave 48 hours prior to surgery.  Men may shave face and neck.  Do not bring valuables to the hospital.  [facility] is not responsible for any belongings or valuables.  Contacts, dentures or bridgework may not be worn into surgery.  Leave your suitcase in the car.  After surgery it may be brought to your room.  For patients admitted to the hospital, discharge time will be determined by your treatment team.  Patients discharged the day of surgery will not be allowed to drive home.   Name and phone number of your driver:   Special instructions:  Do not take any aspirin,anti-inflammatories,vitamins,or herbal supplements 5-7 days prior to surgery.  Please read over the following fact sheets that you were given. MRSA Information

## 2016-09-28 ENCOUNTER — Other Ambulatory Visit: Payer: Self-pay | Admitting: Orthopedic Surgery

## 2016-09-28 ENCOUNTER — Ambulatory Visit
Admission: RE | Admit: 2016-09-28 | Discharge: 2016-09-28 | Disposition: A | Discharge status: Home / Self Care | Payer: Medicaid Other | Source: Ambulatory Visit | Attending: Orthopedic Surgery | Admitting: Orthopedic Surgery

## 2016-09-28 DIAGNOSIS — M25512 Pain in left shoulder: Secondary | ICD-10-CM

## 2016-09-28 DIAGNOSIS — M24012 Loose body in left shoulder: Secondary | ICD-10-CM | POA: Insufficient documentation

## 2016-09-28 DIAGNOSIS — M19012 Primary osteoarthritis, left shoulder: Secondary | ICD-10-CM | POA: Diagnosis not present

## 2016-09-28 DIAGNOSIS — M75102 Unspecified rotator cuff tear or rupture of left shoulder, not specified as traumatic: Secondary | ICD-10-CM | POA: Diagnosis not present

## 2016-09-29 ENCOUNTER — Encounter (HOSPITAL_COMMUNITY): Payer: Self-pay | Admitting: *Deleted

## 2016-09-29 ENCOUNTER — Inpatient Hospital Stay (HOSPITAL_COMMUNITY)
Admission: RE | Admit: 2016-09-29 | Discharge: 2016-10-01 | DRG: 483 | Disposition: A | Discharge status: Home / Self Care | Payer: Medicaid Other | Source: Ambulatory Visit | Attending: Orthopedic Surgery | Admitting: Orthopedic Surgery

## 2016-09-29 ENCOUNTER — Inpatient Hospital Stay (HOSPITAL_COMMUNITY): Payer: Medicaid Other | Admitting: Anesthesiology

## 2016-09-29 ENCOUNTER — Encounter (HOSPITAL_COMMUNITY)
Admission: RE | Disposition: A | Discharge status: Home / Self Care | Payer: Self-pay | Source: Ambulatory Visit | Attending: Orthopedic Surgery

## 2016-09-29 ENCOUNTER — Inpatient Hospital Stay (HOSPITAL_COMMUNITY): Payer: Medicaid Other

## 2016-09-29 DIAGNOSIS — M129 Arthropathy, unspecified: Secondary | ICD-10-CM | POA: Diagnosis present

## 2016-09-29 DIAGNOSIS — Z96698 Presence of other orthopedic joint implants: Secondary | ICD-10-CM | POA: Diagnosis present

## 2016-09-29 DIAGNOSIS — Z7983 Long term (current) use of bisphosphonates: Secondary | ICD-10-CM

## 2016-09-29 DIAGNOSIS — Z972 Presence of dental prosthetic device (complete) (partial): Secondary | ICD-10-CM

## 2016-09-29 DIAGNOSIS — Z91030 Bee allergy status: Secondary | ICD-10-CM | POA: Diagnosis not present

## 2016-09-29 DIAGNOSIS — Z7951 Long term (current) use of inhaled steroids: Secondary | ICD-10-CM | POA: Diagnosis not present

## 2016-09-29 DIAGNOSIS — Z79899 Other long term (current) drug therapy: Secondary | ICD-10-CM | POA: Diagnosis not present

## 2016-09-29 DIAGNOSIS — Z881 Allergy status to other antibiotic agents status: Secondary | ICD-10-CM

## 2016-09-29 DIAGNOSIS — Z90710 Acquired absence of both cervix and uterus: Secondary | ICD-10-CM | POA: Diagnosis not present

## 2016-09-29 DIAGNOSIS — J449 Chronic obstructive pulmonary disease, unspecified: Secondary | ICD-10-CM | POA: Diagnosis present

## 2016-09-29 DIAGNOSIS — Z880 Allergy status to penicillin: Secondary | ICD-10-CM | POA: Diagnosis not present

## 2016-09-29 DIAGNOSIS — M19012 Primary osteoarthritis, left shoulder: Secondary | ICD-10-CM | POA: Diagnosis present

## 2016-09-29 DIAGNOSIS — Z96651 Presence of right artificial knee joint: Secondary | ICD-10-CM | POA: Diagnosis present

## 2016-09-29 DIAGNOSIS — M75102 Unspecified rotator cuff tear or rupture of left shoulder, not specified as traumatic: Secondary | ICD-10-CM | POA: Diagnosis present

## 2016-09-29 DIAGNOSIS — Z981 Arthrodesis status: Secondary | ICD-10-CM | POA: Diagnosis not present

## 2016-09-29 DIAGNOSIS — Z885 Allergy status to narcotic agent status: Secondary | ICD-10-CM | POA: Diagnosis not present

## 2016-09-29 DIAGNOSIS — Z96619 Presence of unspecified artificial shoulder joint: Secondary | ICD-10-CM

## 2016-09-29 DIAGNOSIS — Z9109 Other allergy status, other than to drugs and biological substances: Secondary | ICD-10-CM

## 2016-09-29 DIAGNOSIS — M12812 Other specific arthropathies, not elsewhere classified, left shoulder: Secondary | ICD-10-CM | POA: Diagnosis present

## 2016-09-29 DIAGNOSIS — F17210 Nicotine dependence, cigarettes, uncomplicated: Secondary | ICD-10-CM | POA: Diagnosis present

## 2016-09-29 DIAGNOSIS — Z96612 Presence of left artificial shoulder joint: Secondary | ICD-10-CM

## 2016-09-29 DIAGNOSIS — N189 Chronic kidney disease, unspecified: Secondary | ICD-10-CM | POA: Diagnosis present

## 2016-09-29 HISTORY — PX: TOTAL SHOULDER ARTHROPLASTY: SHX126

## 2016-09-29 SURGERY — ARTHROPLASTY, SHOULDER, TOTAL
Anesthesia: General | Site: Shoulder | Laterality: Left

## 2016-09-29 MED ORDER — SUGAMMADEX SODIUM 200 MG/2ML IV SOLN
INTRAVENOUS | Status: DC | PRN
  Administered 2016-09-29: 122.4 mg via INTRAVENOUS

## 2016-09-29 MED ORDER — METHOCARBAMOL 1000 MG/10ML IJ SOLN
500.0000 mg | Freq: Four times a day (QID) | INTRAVENOUS | Status: DC | PRN
  Filled 2016-09-29: qty 5

## 2016-09-29 MED ORDER — ONDANSETRON HCL 4 MG PO TABS: 4.0000 mg | ORAL_TABLET | Freq: Three times a day (TID) | ORAL | 0 refills | Status: DC | PRN

## 2016-09-29 MED ORDER — LIDOCAINE 2% (20 MG/ML) 5 ML SYRINGE
INTRAMUSCULAR | Status: AC
  Filled 2016-09-29: qty 5

## 2016-09-29 MED ORDER — PHENYLEPHRINE HCL 10 MG/ML IJ SOLN
INTRAMUSCULAR | Status: DC | PRN
  Administered 2016-09-29 (×6): 80 ug via INTRAVENOUS

## 2016-09-29 MED ORDER — METOCLOPRAMIDE HCL 5 MG PO TABS: 5.0000 mg | ORAL_TABLET | Freq: Three times a day (TID) | ORAL | Status: DC | PRN

## 2016-09-29 MED ORDER — SENNA 8.6 MG PO TABS
1.0000 | ORAL_TABLET | Freq: Two times a day (BID) | ORAL | Status: DC
  Administered 2016-09-29 – 2016-10-01 (×4): 8.6 mg via ORAL
  Filled 2016-09-29 (×4): qty 1

## 2016-09-29 MED ORDER — METHOCARBAMOL 500 MG PO TABS: 500.0000 mg | ORAL_TABLET | Freq: Four times a day (QID) | ORAL | 1 refills | Status: DC

## 2016-09-29 MED ORDER — DOCUSATE SODIUM 100 MG PO CAPS
100.0000 mg | ORAL_CAPSULE | Freq: Two times a day (BID) | ORAL | Status: DC
  Administered 2016-09-29 – 2016-10-01 (×4): 100 mg via ORAL
  Filled 2016-09-29 (×4): qty 1

## 2016-09-29 MED ORDER — ONDANSETRON HCL 4 MG/2ML IJ SOLN
4.0000 mg | Freq: Four times a day (QID) | INTRAMUSCULAR | Status: DC | PRN
  Administered 2016-09-29: 4 mg via INTRAVENOUS
  Filled 2016-09-29: qty 2

## 2016-09-29 MED ORDER — SUVOREXANT 20 MG PO TABS
20.0000 mg | ORAL_TABLET | Freq: Every evening | ORAL | Status: DC | PRN
Start: 2016-09-29 — End: 2016-10-01

## 2016-09-29 MED ORDER — LIDOCAINE HCL (CARDIAC) 20 MG/ML IV SOLN
INTRAVENOUS | Status: DC | PRN
  Administered 2016-09-29: 100 mg via INTRAVENOUS

## 2016-09-29 MED ORDER — TIZANIDINE HCL 2 MG PO TABS
2.0000 mg | ORAL_TABLET | Freq: Three times a day (TID) | ORAL | Status: DC | PRN
  Filled 2016-09-29: qty 2

## 2016-09-29 MED ORDER — ROCURONIUM BROMIDE 50 MG/5ML IV SOSY
PREFILLED_SYRINGE | INTRAVENOUS | Status: AC
  Filled 2016-09-29: qty 5

## 2016-09-29 MED ORDER — ROCURONIUM BROMIDE 100 MG/10ML IV SOLN
INTRAVENOUS | Status: DC | PRN
  Administered 2016-09-29: 50 mg via INTRAVENOUS

## 2016-09-29 MED ORDER — ROPIVACAINE HCL 7.5 MG/ML IJ SOLN
INTRAMUSCULAR | Status: DC | PRN
  Administered 2016-09-29: 10 mL via PERINEURAL

## 2016-09-29 MED ORDER — IPRATROPIUM-ALBUTEROL 0.5-2.5 (3) MG/3ML IN SOLN: 3.0000 mL | Freq: Four times a day (QID) | RESPIRATORY_TRACT | Status: DC | PRN

## 2016-09-29 MED ORDER — ALPRAZOLAM 1 MG PO TABS
1.0000 mg | ORAL_TABLET | Freq: Three times a day (TID) | ORAL | Status: DC
  Administered 2016-09-29: 1 mg via ORAL

## 2016-09-29 MED ORDER — SODIUM CHLORIDE 0.9 % IR SOLN
Status: DC | PRN
  Administered 2016-09-29: 1000 mL

## 2016-09-29 MED ORDER — ALPRAZOLAM 0.5 MG PO TABS
1.0000 mg | ORAL_TABLET | Freq: Three times a day (TID) | ORAL | Status: DC
  Administered 2016-09-29 – 2016-10-01 (×5): 1 mg via ORAL
  Filled 2016-09-29 (×5): qty 2

## 2016-09-29 MED ORDER — MIDAZOLAM HCL 2 MG/2ML IJ SOLN
2.0000 mg | Freq: Once | INTRAMUSCULAR | Status: AC
  Administered 2016-09-29: 2 mg via INTRAVENOUS
  Filled 2016-09-29: qty 2

## 2016-09-29 MED ORDER — QUETIAPINE FUMARATE 400 MG PO TABS
400.0000 mg | ORAL_TABLET | Freq: Every day | ORAL | Status: DC
Start: 2016-09-29 — End: 2016-10-01
  Administered 2016-09-29 – 2016-09-30 (×2): 400 mg via ORAL
  Filled 2016-09-29 (×2): qty 1

## 2016-09-29 MED ORDER — MIDAZOLAM HCL 2 MG/2ML IJ SOLN
INTRAMUSCULAR | Status: AC
  Administered 2016-09-29: 2 mg via INTRAVENOUS
  Filled 2016-09-29: qty 2

## 2016-09-29 MED ORDER — EPHEDRINE SULFATE-NACL 50-0.9 MG/10ML-% IV SOSY
PREFILLED_SYRINGE | INTRAVENOUS | Status: DC | PRN
  Administered 2016-09-29 (×2): 5 mg via INTRAVENOUS

## 2016-09-29 MED ORDER — OXYCODONE HCL 10 MG PO TABS: 20.0000 mg | ORAL_TABLET | ORAL | 0 refills | Status: DC | PRN

## 2016-09-29 MED ORDER — ONDANSETRON HCL 4 MG PO TABS: 4.0000 mg | ORAL_TABLET | Freq: Four times a day (QID) | ORAL | Status: DC | PRN

## 2016-09-29 MED ORDER — ALBUTEROL SULFATE (2.5 MG/3ML) 0.083% IN NEBU: 3.0000 mL | INHALATION_SOLUTION | Freq: Four times a day (QID) | RESPIRATORY_TRACT | Status: DC | PRN

## 2016-09-29 MED ORDER — FENTANYL CITRATE (PF) 100 MCG/2ML IJ SOLN
INTRAMUSCULAR | Status: AC
  Filled 2016-09-29: qty 2

## 2016-09-29 MED ORDER — PHENYLEPHRINE HCL 10 MG/ML IJ SOLN
INTRAVENOUS | Status: DC | PRN
  Administered 2016-09-29: 50 ug/min via INTRAVENOUS

## 2016-09-29 MED ORDER — PROMETHAZINE HCL 25 MG PO TABS: 25.0000 mg | ORAL_TABLET | Freq: Two times a day (BID) | ORAL | Status: DC | PRN

## 2016-09-29 MED ORDER — FENTANYL CITRATE (PF) 100 MCG/2ML IJ SOLN
INTRAMUSCULAR | Status: AC
  Administered 2016-09-29: 100 ug via INTRAVENOUS
  Filled 2016-09-29: qty 2

## 2016-09-29 MED ORDER — METHOCARBAMOL 500 MG PO TABS
500.0000 mg | ORAL_TABLET | Freq: Four times a day (QID) | ORAL | Status: DC | PRN
  Filled 2016-09-29 (×3): qty 1

## 2016-09-29 MED ORDER — HYDROMORPHONE HCL 1 MG/ML IJ SOLN
INTRAMUSCULAR | Status: AC
  Administered 2016-09-29: 0.5 mg via INTRAVENOUS
  Filled 2016-09-29: qty 1

## 2016-09-29 MED ORDER — ALPRAZOLAM 0.25 MG PO TABS
ORAL_TABLET | ORAL | Status: AC
  Administered 2016-09-29: 1 mg via ORAL
  Filled 2016-09-29: qty 4

## 2016-09-29 MED ORDER — ADULT MULTIVITAMIN W/MINERALS CH
1.0000 | ORAL_TABLET | Freq: Every day | ORAL | Status: DC
  Administered 2016-09-30 – 2016-10-01 (×2): 1 via ORAL
  Filled 2016-09-29 (×2): qty 1

## 2016-09-29 MED ORDER — OXYCODONE HCL 5 MG/5ML PO SOLN: 5.0000 mg | Freq: Once | ORAL | Status: DC | PRN

## 2016-09-29 MED ORDER — BUPIVACAINE-EPINEPHRINE (PF) 0.5% -1:200000 IJ SOLN
INTRAMUSCULAR | Status: DC | PRN
  Administered 2016-09-29: 20 mL via PERINEURAL

## 2016-09-29 MED ORDER — MIDAZOLAM HCL 2 MG/2ML IJ SOLN
INTRAMUSCULAR | Status: AC
Start: 2016-09-29 — End: 2016-09-29
  Filled 2016-09-29: qty 2

## 2016-09-29 MED ORDER — ALUM & MAG HYDROXIDE-SIMETH 200-200-20 MG/5ML PO SUSP: 30.0000 mL | ORAL | Status: DC | PRN

## 2016-09-29 MED ORDER — PHENOL 1.4 % MT LIQD: 1.0000 | OROMUCOSAL | Status: DC | PRN

## 2016-09-29 MED ORDER — PREGABALIN 100 MG PO CAPS
100.0000 mg | ORAL_CAPSULE | Freq: Three times a day (TID) | ORAL | Status: DC
  Administered 2016-09-29 – 2016-10-01 (×5): 100 mg via ORAL
  Filled 2016-09-29 (×5): qty 1

## 2016-09-29 MED ORDER — BISACODYL 10 MG RE SUPP: 10.0000 mg | Freq: Every day | RECTAL | Status: DC | PRN

## 2016-09-29 MED ORDER — FENTANYL CITRATE (PF) 100 MCG/2ML IJ SOLN
INTRAMUSCULAR | Status: DC | PRN
  Administered 2016-09-29: 100 ug via INTRAVENOUS

## 2016-09-29 MED ORDER — MENTHOL 3 MG MT LOZG: 1.0000 | LOZENGE | OROMUCOSAL | Status: DC | PRN

## 2016-09-29 MED ORDER — OXYCODONE HCL 5 MG PO TABS
20.0000 mg | ORAL_TABLET | ORAL | Status: DC | PRN
  Administered 2016-09-29 – 2016-10-01 (×11): 20 mg via ORAL
  Filled 2016-09-29 (×11): qty 4

## 2016-09-29 MED ORDER — MOMETASONE FURO-FORMOTEROL FUM 200-5 MCG/ACT IN AERO
2.0000 | INHALATION_SPRAY | Freq: Two times a day (BID) | RESPIRATORY_TRACT | Status: DC
  Administered 2016-09-30 – 2016-10-01 (×3): 2 via RESPIRATORY_TRACT
  Filled 2016-09-29 (×2): qty 8.8

## 2016-09-29 MED ORDER — VANCOMYCIN HCL IN DEXTROSE 1-5 GM/200ML-% IV SOLN
1000.0000 mg | INTRAVENOUS | Status: AC
  Administered 2016-09-29: 1000 mg via INTRAVENOUS
  Filled 2016-09-29: qty 200

## 2016-09-29 MED ORDER — LACTATED RINGERS IV SOLN
INTRAVENOUS | Status: DC
  Administered 2016-09-29: 09:00:00 via INTRAVENOUS

## 2016-09-29 MED ORDER — HYDROMORPHONE HCL 1 MG/ML IJ SOLN
1.0000 mg | INTRAMUSCULAR | Status: DC | PRN
  Administered 2016-09-29: 1 mg via INTRAVENOUS

## 2016-09-29 MED ORDER — SENNA-DOCUSATE SODIUM 8.6-50 MG PO TABS: 2.0000 | ORAL_TABLET | Freq: Every day | ORAL | 1 refills | Status: DC

## 2016-09-29 MED ORDER — CHLORHEXIDINE GLUCONATE 4 % EX LIQD: 60.0000 mL | Freq: Once | CUTANEOUS | Status: DC

## 2016-09-29 MED ORDER — VANCOMYCIN HCL IN DEXTROSE 1-5 GM/200ML-% IV SOLN
1000.0000 mg | Freq: Two times a day (BID) | INTRAVENOUS | Status: AC
  Administered 2016-09-29: 1000 mg via INTRAVENOUS
  Filled 2016-09-29: qty 200

## 2016-09-29 MED ORDER — PROPOFOL 10 MG/ML IV BOLUS
INTRAVENOUS | Status: AC
  Filled 2016-09-29: qty 20

## 2016-09-29 MED ORDER — LACTATED RINGERS IV SOLN
INTRAVENOUS | Status: DC | PRN
  Administered 2016-09-29 (×2): via INTRAVENOUS

## 2016-09-29 MED ORDER — DIPHENHYDRAMINE HCL 12.5 MG/5ML PO ELIX: 12.5000 mg | ORAL_SOLUTION | ORAL | Status: DC | PRN

## 2016-09-29 MED ORDER — METOCLOPRAMIDE HCL 5 MG/ML IJ SOLN: 5.0000 mg | Freq: Three times a day (TID) | INTRAMUSCULAR | Status: DC | PRN

## 2016-09-29 MED ORDER — HYDROMORPHONE HCL 1 MG/ML IJ SOLN
INTRAMUSCULAR | Status: AC
  Administered 2016-09-29: 1 mg via INTRAVENOUS
  Filled 2016-09-29: qty 1

## 2016-09-29 MED ORDER — POTASSIUM CHLORIDE IN NACL 20-0.45 MEQ/L-% IV SOLN
INTRAVENOUS | Status: DC
Start: 2016-09-29 — End: 2016-10-01
  Administered 2016-09-29: 22:00:00 via INTRAVENOUS
  Filled 2016-09-29 (×2): qty 1000

## 2016-09-29 MED ORDER — MAGNESIUM CITRATE PO SOLN: 1.0000 | Freq: Once | ORAL | Status: DC | PRN

## 2016-09-29 MED ORDER — ONDANSETRON HCL 4 MG/2ML IJ SOLN
INTRAMUSCULAR | Status: DC | PRN
  Administered 2016-09-29: 4 mg via INTRAVENOUS

## 2016-09-29 MED ORDER — MIDAZOLAM HCL 5 MG/5ML IJ SOLN
INTRAMUSCULAR | Status: DC | PRN
  Administered 2016-09-29: 2 mg via INTRAVENOUS

## 2016-09-29 MED ORDER — AMPHETAMINE-DEXTROAMPHETAMINE 10 MG PO TABS
15.0000 mg | ORAL_TABLET | Freq: Two times a day (BID) | ORAL | Status: DC
  Administered 2016-09-29 – 2016-10-01 (×4): 15 mg via ORAL
  Filled 2016-09-29 (×4): qty 2

## 2016-09-29 MED ORDER — FENTANYL CITRATE (PF) 100 MCG/2ML IJ SOLN
100.0000 ug | Freq: Once | INTRAMUSCULAR | Status: AC
  Administered 2016-09-29: 100 ug via INTRAVENOUS
  Filled 2016-09-29: qty 2

## 2016-09-29 MED ORDER — OXYCODONE HCL 5 MG PO TABS: 5.0000 mg | ORAL_TABLET | Freq: Once | ORAL | Status: DC | PRN

## 2016-09-29 MED ORDER — HYDROMORPHONE HCL 2 MG/ML IJ SOLN
1.0000 mg | INTRAMUSCULAR | Status: DC | PRN
  Administered 2016-09-29 – 2016-09-30 (×8): 1 mg via INTRAVENOUS
  Filled 2016-09-29 (×8): qty 1

## 2016-09-29 MED ORDER — FLUTICASONE PROPIONATE 50 MCG/ACT NA SUSP
2.0000 | Freq: Every day | NASAL | Status: DC
  Administered 2016-09-30 – 2016-10-01 (×2): 2 via NASAL
  Filled 2016-09-29: qty 16

## 2016-09-29 MED ORDER — HYDROMORPHONE HCL 1 MG/ML IJ SOLN
0.2500 mg | INTRAMUSCULAR | Status: DC | PRN
  Administered 2016-09-29 (×4): 0.5 mg via INTRAVENOUS

## 2016-09-29 MED ORDER — ONDANSETRON HCL 4 MG/2ML IJ SOLN
4.0000 mg | Freq: Once | INTRAMUSCULAR | Status: AC
  Administered 2016-09-29: 4 mg via INTRAVENOUS

## 2016-09-29 MED ORDER — POLYETHYLENE GLYCOL 3350 17 G PO PACK: 17.0000 g | PACK | Freq: Every day | ORAL | Status: DC | PRN

## 2016-09-29 MED ORDER — ONDANSETRON HCL 4 MG/2ML IJ SOLN
INTRAMUSCULAR | Status: AC
  Administered 2016-09-29: 4 mg via INTRAVENOUS
  Filled 2016-09-29: qty 2

## 2016-09-29 MED ORDER — 0.9 % SODIUM CHLORIDE (POUR BTL) OPTIME
TOPICAL | Status: DC | PRN
Start: 2016-09-29 — End: 2016-09-29
  Administered 2016-09-29: 1000 mL

## 2016-09-29 MED ORDER — PROPOFOL 10 MG/ML IV BOLUS
INTRAVENOUS | Status: DC | PRN
  Administered 2016-09-29: 200 mg via INTRAVENOUS

## 2016-09-29 MED ORDER — HYDROXYZINE HCL 25 MG PO TABS: 25.0000 mg | ORAL_TABLET | Freq: Three times a day (TID) | ORAL | Status: DC | PRN

## 2016-09-29 MED ORDER — HYDROMORPHONE HCL 2 MG PO TABS: 2.0000 mg | ORAL_TABLET | ORAL | 0 refills | Status: DC | PRN

## 2016-09-29 SURGICAL SUPPLY — 53 items
BIT DRILL TWIST 2.7 (BIT) ×1 IMPLANT
BIT DRILL TWIST 2.7MM (BIT) ×1
BLADE SAW SGTL MED 73X18.5 STR (BLADE) ×3 IMPLANT
CAPT SHLDR REVTOTAL 2 ×2 IMPLANT
CLOSURE STERI-STRIP 1/2X4 (GAUZE/BANDAGES/DRESSINGS) ×1
CLSR STERI-STRIP ANTIMIC 1/2X4 (GAUZE/BANDAGES/DRESSINGS) ×2 IMPLANT
COVER SURGICAL LIGHT HANDLE (MISCELLANEOUS) ×3 IMPLANT
DRAPE ORTHO SPLIT 77X108 STRL (DRAPES) ×6
DRAPE PROXIMA HALF (DRAPES) ×3 IMPLANT
DRAPE SURG ORHT 6 SPLT 77X108 (DRAPES) ×2 IMPLANT
DRAPE U-SHAPE 47X51 STRL (DRAPES) ×3 IMPLANT
DRSG MEPILEX BORDER 4X8 (GAUZE/BANDAGES/DRESSINGS) ×3 IMPLANT
DURAPREP 26ML APPLICATOR (WOUND CARE) ×3 IMPLANT
ELECT REM PT RETURN 9FT ADLT (ELECTROSURGICAL) ×3
ELECTRODE REM PT RTRN 9FT ADLT (ELECTROSURGICAL) ×1 IMPLANT
GLOVE BIOGEL PI ORTHO PRO SZ8 (GLOVE) ×4
GLOVE ORTHO TXT STRL SZ7.5 (GLOVE) ×3 IMPLANT
GLOVE PI ORTHO PRO STRL SZ8 (GLOVE) ×2 IMPLANT
GLOVE SURG ORTHO 8.0 STRL STRW (GLOVE) ×3 IMPLANT
GOWN STRL REUS W/ TWL XL LVL3 (GOWN DISPOSABLE) ×1 IMPLANT
GOWN STRL REUS W/TWL 2XL LVL3 (GOWN DISPOSABLE) ×3 IMPLANT
GOWN STRL REUS W/TWL XL LVL3 (GOWN DISPOSABLE) ×3
HANDPIECE INTERPULSE COAX TIP (DISPOSABLE) ×3
HOOD PEEL AWAY FACE SHEILD DIS (HOOD) ×6 IMPLANT
KIT BASIN OR (CUSTOM PROCEDURE TRAY) ×3 IMPLANT
KIT ROOM TURNOVER OR (KITS) ×3 IMPLANT
MANIFOLD NEPTUNE II (INSTRUMENTS) ×3 IMPLANT
NS IRRIG 1000ML POUR BTL (IV SOLUTION) ×3 IMPLANT
PACK SHOULDER (CUSTOM PROCEDURE TRAY) ×3 IMPLANT
PAD ARMBOARD 7.5X6 YLW CONV (MISCELLANEOUS) ×6 IMPLANT
PIN STEINMANN THREADED TIP (PIN) ×3 IMPLANT
PIN THREADED REVERSE (PIN) ×3 IMPLANT
SET HNDPC FAN SPRY TIP SCT (DISPOSABLE) ×1 IMPLANT
SLING ARM IMMOBILIZER LRG (SOFTGOODS) ×2 IMPLANT
SLING ARM IMMOBILIZER MED (SOFTGOODS) IMPLANT
SMARTMIX MINI TOWER (MISCELLANEOUS)
SUCTION FRAZIER HANDLE 10FR (MISCELLANEOUS) ×2
SUCTION TUBE FRAZIER 10FR DISP (MISCELLANEOUS) ×1 IMPLANT
SUPPORT WRAP ARM LG (MISCELLANEOUS) ×3 IMPLANT
SUT FIBERWIRE #2 38 REV NDL BL (SUTURE) ×18
SUT MNCRL AB 4-0 PS2 18 (SUTURE) IMPLANT
SUT VIC AB 0 CT1 27 (SUTURE) ×3
SUT VIC AB 0 CT1 27XBRD ANBCTR (SUTURE) ×1 IMPLANT
SUT VIC AB 2-0 CT1 27 (SUTURE) ×3
SUT VIC AB 2-0 CT1 TAPERPNT 27 (SUTURE) ×1 IMPLANT
SUT VIC AB 3-0 SH 8-18 (SUTURE) ×3 IMPLANT
SUTURE FIBERWR#2 38 REV NDL BL (SUTURE) ×6 IMPLANT
TOWEL OR 17X24 6PK STRL BLUE (TOWEL DISPOSABLE) ×3 IMPLANT
TOWEL OR 17X26 10 PK STRL BLUE (TOWEL DISPOSABLE) ×3 IMPLANT
TOWER SMARTMIX MINI (MISCELLANEOUS) IMPLANT
TUBE CONNECTING 12'X1/4 (SUCTIONS)
TUBE CONNECTING 12X1/4 (SUCTIONS) IMPLANT
YANKAUER SUCT BULB TIP NO VENT (SUCTIONS) ×3 IMPLANT

## 2016-09-29 NOTE — Progress Notes (Signed)
Patient admitted to room 5N12 accompanied by husband.  Oriented to room and bed in low and locked position.  Also oriented to unit staff, routine, and to call light system.  Cautioned re:  Limiting movement of LUE due to surgery, but she is refusing to wear sling, stating that since she "no longer has any more tendons there, there is nothing to tear".  Cautioned her re:  Excessive movement which may irritate surrounding tissues and affect healing.  She is demanding to eat, but yet also states she is nauseated.  Consuming crackers and ordered double portions for supper, but wants an anti-emetic.  Able to transfer with SBA  to commode without difficulty.  Voiding in large amts.  Has shoulder block / LUE.

## 2016-09-29 NOTE — Discharge Instructions (Signed)

## 2016-09-29 NOTE — Op Note (Signed)
09/29/2016  1:15 PM  PATIENT:  Jamie Schultz    PRE-OPERATIVE DIAGNOSIS:  LEFT SHOULDER ROTATOR cuff arthropathy  POST-OPERATIVE DIAGNOSIS:  Same  PROCEDURE:  Reverse TOTAL SHOULDER ARTHROPLASTY  SURGEON:  Johnny Bridge, MD  PHYSICIAN ASSISTANT: Joya Gaskins, OPA-C, present and scrubbed throughout the case, critical for completion in a timely fashion, and for retraction, instrumentation, and closure.  ANESTHESIA:   General  PREOPERATIVE INDICATIONS:  Jamie Schultz is a  56 y.o. female with a diagnosis of rotator cuff arthropathy LEFT SHOULDER who failed conservative measures and elected for surgical management.    The risks benefits and alternatives were discussed with the patient preoperatively including but not limited to the risks of infection, bleeding, nerve injury, cardiopulmonary complications, the need for revision surgery, dislocation, brachial plexus palsy, incomplete relief of pain, among others, and the patient was willing to proceed.  OPERATIVE IMPLANTS: Biomet size 12 humeral stem press-fit standard with a 44 mm reverse shoulder arthroplasty tray with a standard liner and a 36 mm glenosphere with a mini baseplate and 3 locking screws and one central nonlocking screw.  OPERATIVE FINDINGS: Advanced rotator cuff arthropathy, no functional subscapularis to repair, no supraspinatus.  Extreme stiffness, had to cut the humerus three times to get appropriate soft tissue tension.  The glenoid still had cartilage on it, and although the CAT scan appeared that there was significant superior wear, upon direct visualization I think it was more because of the orientation of the slices of the CAT scan, and I did angle the guidepin slightly inferior to achieve a slight declination angle of the glenoid sphere, but I don't think I needed to be aggressive with this.  OPERATIVE PROCEDURE: The patient was brought to the operating room and placed in the supine position. General anesthesia was  administered. IV antibiotics were given with vancomycin. There was extreme difficulty getting IV access, and she ended up having to have an external jugular vein IV.Time out was performed. The upper extremity was prepped and draped in usual sterile fashion. The patient was in a beachchair position. Deltopectoral approach was carried out. The biceps was tenodesed to the pectoralis tendon with #2 Fiberwire. There was really no subscapularis to speak of, there was some capsule which was torn, and I did tag this, but it was not really quality enough for future repair.  I then performed circumferential releases of the humerus, and then dislocated the head, and then reamed with the reamer to the above named size.  I then applied the jig, and cut the humeral head in 30 of retroversion, and then turned my attention to the glenoid.  Deep retractors were placed, and I resected the labrum, and then placed a guidepin into the center position on the glenoid, with slight inferior inclination. I then reamed over the guidepin, and this created a small metaphyseal cancellus blush inferiorly, removing just the cartilage to the subchondral bone superiorly. The base plate was selected and impacted place, and then I secured it centrally with a nonlocking screw, and I had excellent purchase both inferiorly and superiorly. I placed a short locking screws on anterior aspects.  I then turned my attention to the glenosphere, and impacted this into place, placing slight inferior offset (set on B). It was very difficult to get the glenoid sphere and, because the space itself was very small, and I was not able to visualize the stem of the glenoid sphere, but did feel it engage and seat.  The glenoid sphere was completely  seated, and had engagement of the [facility] taper. I then turned my attention back to the humerus.  I sequentially broached, and then trialed, and was found to restore soft tissue tension, and it had 2 finger  tightness. Initially, I was not able to reduce it, I had to remove more proximal humerus with an additional cut, I still was unable, and then I performed a third cut, and ultimately was able to reduce the prosthesis. Therefore the above named components were selected. The shoulder felt stable throughout functional motion.  There was no subscapularis for repair.  I then impacted the real prosthesis into place, as well as the real humeral tray, and reduced the shoulder. The shoulder had excellent motion, and was stable, and I irrigated the wounds copiously.    I then irrigated the shoulder copiously once more, repaired the deltopectoral interval with Vicryl followed by subcutaneous Vicryl with Steri-Strips and sterile gauze for the skin. The patient was awakened and returned back in stable and satisfactory condition. There no complications and She tolerated the procedure well.
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
## 2016-09-29 NOTE — Anesthesia Procedure Notes (Signed)
Procedure Name: Intubation Date/Time: 09/29/2016 10:43 AM Performed by: Manus Gunning, Shariah Assad J Pre-anesthesia Checklist: Patient identified, Emergency Drugs available, Suction available, Patient being monitored and Timeout performed Patient Re-evaluated:Patient Re-evaluated prior to inductionOxygen Delivery Method: Circle system utilized Preoxygenation: Pre-oxygenation with 100% oxygen Intubation Type: IV induction Ventilation: Mask ventilation without difficulty Laryngoscope Size: Mac and 3 Grade View: Grade I Tube type: Oral Tube size: 7.0 mm Number of attempts: 1 Placement Confirmation: ETT inserted through vocal cords under direct vision,  positive ETCO2 and breath sounds checked- equal and bilateral Secured at: 22 cm Tube secured with: Tape Dental Injury: Teeth and Oropharynx as per pre-operative assessment

## 2016-09-29 NOTE — Transfer of Care (Signed)
Immediate Anesthesia Transfer of Care Note  Patient: Jamie Schultz  Procedure(s) Performed: Procedure(s): TOTAL SHOULDER ARTHROPLASTY (Left)  Patient Location: PACU  Anesthesia Type:General  Level of Consciousness: awake  Airway & Oxygen Therapy: Patient Spontanous Breathing  Post-op Assessment: Report given to RN and Post -op Vital signs reviewed and stable  Post vital signs: Reviewed and stable  Last Vitals:  Vitals:   09/29/16 1016 09/29/16 1017  BP: 102/71   Pulse: 88 91  Resp: 13 16  Temp:      Last Pain:  Vitals:   09/29/16 1016  TempSrc:   PainSc: 7          Complications: No apparent anesthesia complications

## 2016-09-29 NOTE — Progress Notes (Signed)
Orthopedic Tech Progress Note Patient Details:  Jamie Schultz Mar 18, 1961 [id number] Talked to RN states patient has sling and unable to use collar or frame. Patient ID: Jamie Schultz, female   DOB: 05-Jul-1961, 55 y.o.   MRN: [id number]   Braulio Bosch 09/29/2016, 6:56 PM

## 2016-09-29 NOTE — Anesthesia Procedure Notes (Signed)
Anesthesia Regional Block:  Interscalene brachial plexus block  Pre-Anesthetic Checklist: ,, timeout performed, Correct Patient, Correct Site, Correct Laterality, Correct Procedure, Correct Position, site marked, Risks and benefits discussed,  Surgical consent,  Pre-op evaluation,  At surgeon's request and post-op pain management  Laterality: Left  Prep: chloraprep       Needles:  Injection technique: Single-shot  Needle Type: Echogenic Stimulator Needle     Needle Length: 5cm 5 cm Needle Gauge: 22 and 22 G    Additional Needles:  Procedures: ultrasound guided (picture in chart) Interscalene brachial plexus block Narrative:  Start time: 09/29/2016 10:04 AM End time: 09/29/2016 10:11 AM Injection made incrementally with aspirations every 5 mL.  Performed by: Personally  Anesthesiologist: Jaanvi Fizer  Additional Notes: Functioning IV was confirmed and monitors applied.  A 39mm 22ga echogenic arrow stimulator was used. Sterile prep and drape,hand hygiene and sterile gloves were used.Ultrasound guidance: relevant anatomy identified, needle position confirmed, local anesthetic spread visualized around nerve(s)., vascular puncture avoided.  Image printed for medical record.  Negative aspiration and negative test dose prior to incremental administration of local anesthetic. The patient tolerated the procedure well.

## 2016-09-29 NOTE — Anesthesia Preprocedure Evaluation (Signed)
Anesthesia Evaluation  Patient identified by MRN, date of birth, ID band Patient awake    Reviewed: Allergy & Precautions, NPO status , Patient's Chart, lab work & pertinent test results  History of Anesthesia Complications Negative for: history of anesthetic complications  Airway Mallampati: II  TM Distance: >3 FB Neck ROM: Full    Dental  (+) Edentulous Upper, Edentulous Lower   Pulmonary shortness of breath, COPD, Current Smoker,    + rhonchi        Cardiovascular negative cardio ROS   Rhythm:Regular     Neuro/Psych PSYCHIATRIC DISORDERS Anxiety Depression  Neuromuscular disease    GI/Hepatic negative GI ROS, Neg liver ROS,   Endo/Other  Hypothyroidism   Renal/GU Renal disease     Musculoskeletal  (+) Arthritis , Fibromyalgia -  Abdominal   Peds  Hematology   Anesthesia Other Findings   Reproductive/Obstetrics                             Anesthesia Physical Anesthesia Plan  ASA: III  Anesthesia Plan: General   Post-op Pain Management:  Regional for Post-op pain   Induction: Intravenous  Airway Management Planned: Oral ETT  Additional Equipment: None  Intra-op Plan:   Post-operative Plan: Extubation in OR  Informed Consent: I have reviewed the patients History and Physical, chart, labs and discussed the procedure including the risks, benefits and alternatives for the proposed anesthesia with the patient or authorized representative who has indicated his/her understanding and acceptance.   Dental advisory given  Plan Discussed with: CRNA and Surgeon  Anesthesia Plan Comments:         Anesthesia Quick Evaluation

## 2016-09-29 NOTE — Anesthesia Postprocedure Evaluation (Signed)
Anesthesia Post Note  Patient: Jamie Schultz  Procedure(s) Performed: Procedure(s) (LRB): TOTAL SHOULDER ARTHROPLASTY (Left)  Patient location during evaluation: PACU Anesthesia Type: General and Regional Level of consciousness: awake and alert Pain management: pain level controlled Vital Signs Assessment: post-procedure vital signs reviewed and stable Respiratory status: spontaneous breathing, nonlabored ventilation, respiratory function stable and patient connected to nasal cannula oxygen Cardiovascular status: blood pressure returned to baseline and stable Postop Assessment: no signs of nausea or vomiting Anesthetic complications: no       Last Vitals:  Vitals:   09/29/16 1715 09/29/16 1815  BP: 121/85 106/74  Pulse: 85 90  Resp: 14 18  Temp:      Last Pain:  Vitals:   09/29/16 1345  TempSrc:   PainSc: 0-No pain                 Zubin Pontillo,W. EDMOND

## 2016-09-30 ENCOUNTER — Encounter (HOSPITAL_COMMUNITY): Payer: Self-pay | Admitting: Orthopedic Surgery

## 2016-09-30 LAB — GLUCOSE, CAPILLARY
Glucose-Capillary: 114 mg/dL — ABNORMAL HIGH (ref 65–99)
Glucose-Capillary: 169 mg/dL — ABNORMAL HIGH (ref 65–99)
Glucose-Capillary: 139 mg/dL — ABNORMAL HIGH (ref 65–99)

## 2016-09-30 LAB — CBC
HCT: 34.4 % — ABNORMAL LOW (ref 36.0–46.0)
Hemoglobin: 11.1 g/dL — ABNORMAL LOW (ref 12.0–15.0)
MCH: 29.5 pg (ref 26.0–34.0)
MCHC: 32.3 g/dL (ref 30.0–36.0)
MCV: 91.5 fL (ref 78.0–100.0)
PLATELETS: 242 10*3/uL (ref 150–400)
RBC: 3.76 MIL/uL — AB (ref 3.87–5.11)
RDW: 16 % — ABNORMAL HIGH (ref 11.5–15.5)
WBC: 9.8 10*3/uL (ref 4.0–10.5)

## 2016-09-30 LAB — BASIC METABOLIC PANEL
Anion gap: 11 (ref 5–15)
BUN: 8 mg/dL (ref 6–20)
CO2: 21 mmol/L — ABNORMAL LOW (ref 22–32)
Calcium: 8.2 mg/dL — ABNORMAL LOW (ref 8.9–10.3)
Chloride: 102 mmol/L (ref 101–111)
Creatinine, Ser: 0.93 mg/dL (ref 0.44–1.00)
GFR calc Af Amer: >60 mL/min (ref >60)
GLUCOSE: 124 mg/dL — AB (ref 65–99)
POTASSIUM: 3.9 mmol/L (ref 3.5–5.1)
Sodium: 134 mmol/L — ABNORMAL LOW (ref 135–145)

## 2016-09-30 MED ORDER — OXYCODONE HCL 10 MG PO TABS: 15.0000 mg | ORAL_TABLET | Freq: Four times a day (QID) | ORAL | 0 refills | Status: DC | PRN

## 2016-09-30 MED ORDER — HYDROMORPHONE HCL 2 MG PO TABS: 2.0000 mg | ORAL_TABLET | ORAL | 0 refills | Status: DC | PRN

## 2016-09-30 MED ORDER — DIAZEPAM 5 MG PO TABS
5.0000 mg | ORAL_TABLET | Freq: Four times a day (QID) | ORAL | Status: DC | PRN
  Administered 2016-09-30 – 2016-10-01 (×3): 5 mg via ORAL
  Filled 2016-09-30 (×3): qty 1

## 2016-09-30 NOTE — Progress Notes (Signed)
Patient ID: Jamie Schultz, female   DOB: 1961/05/25, 56 y.o.   MRN: [id number]     Subjective:  Patient reports pain as mild to moderate.  Patient in bed and crying out for pain medicine.  Patient has been in pain mangement  Objective:   VITALS:   Vitals:   09/29/16 1815 09/29/16 1848 09/29/16 2043 09/30/16 0522  BP: 106/74 131/77 102/63 111/62  Pulse: 90 89 77 92  Resp: 18 18 18 18   Temp: 98.8 F (37.1 C) 98.8 F (37.1 C) 99.3 F (37.4 C) 99.8 F (37.7 C)  TempSrc:  Oral Oral Oral  SpO2: 95% 95% 96% 97%  Weight:        ABD soft Sensation intact distally Dorsiflexion/Plantar flexion intact Incision: dressing C/D/I and no drainage Good hand and wrist motion  Lab Results  Component Value Date   WBC 9.8 09/30/2016   HGB 11.1 (L) 09/30/2016   HCT 34.4 (L) 09/30/2016   MCV 91.5 09/30/2016   PLT 242 09/30/2016   BMET    Component Value Date/Time   NA 134 (L) 09/30/2016 0600   NA 141 09/04/2015 1130   NA 142 06/13/2014 2036   K 3.9 09/30/2016 0600   K 4.2 06/13/2014 2036   CL 102 09/30/2016 0600   CL 110 (H) 06/13/2014 2036   CO2 21 (L) 09/30/2016 0600   CO2 27 06/13/2014 2036   GLUCOSE 124 (H) 09/30/2016 0600   GLUCOSE 114 (H) 06/13/2014 2036   BUN 8 09/30/2016 0600   BUN 9 09/04/2015 1130   BUN 8 06/13/2014 2036   CREATININE 0.93 09/30/2016 0600   CREATININE 0.99 04/24/2016 1043   CALCIUM 8.2 (L) 09/30/2016 0600   CALCIUM 9.5 06/13/2014 2036   GFRNONAA >60 09/30/2016 0600   GFRNONAA 63 04/24/2016 1043   GFRAA >60 09/30/2016 0600   GFRAA 73 04/24/2016 1043     Assessment/Plan: 1 Day Post-Op   Principal Problem:   Left rotator cuff tear arthropathy Active Problems:   S/P shoulder replacement   Advance diet Up with therapy Sling for comfort but optional  Will add valium Plan for [place] 09/30/2016, 9:16 AM  Discussed and agree with above.    Marchia Bond, MD Cell [phone]

## 2016-09-30 NOTE — Plan of Care (Signed)
Problem: Safety: Goal: Ability to remain free from injury will improve Outcome: Progressing No fall or injury noted, safety precautions maintained  Problem: Pain Managment: Goal: General experience of comfort will improve Outcome: Progressing Medicated three times for pain, resting in the bed with eyes closed at present time, no acute distress noted, patient is on continuous O2 sat monitor, sat. 97% on O2 at 2l via [place]  Problem: Physical Regulation: Goal: Will remain free from infection Outcome: Progressing No S/S of infections noted, VS WNL  Problem: Tissue Perfusion: Goal: Risk factors for ineffective tissue perfusion will decrease Outcome: Progressing SCDs are on, no S/S of DVT noted  Problem: Activity: Goal: Risk for activity intolerance will decrease Outcome: Progressing Tolerates activity well, OOB to BR independently  Problem: Bowel/Gastric: Goal: Will not experience complications related to bowel motility Outcome: Progressing No gastric or bowel complications noted

## 2016-09-30 NOTE — Evaluation (Signed)
Occupational Therapy Evaluation Patient Details Name: Jamie Schultz MRN: [id number] DOB: 18-Aug-1961 Today's Date: 09/30/2016    History of Present Illness 56 y/o female presenting post op left reverse shoulder arthroplasty. Pt has a past medical history including  Abscess; ADHD;Anxiety; Arthritis; Chronic back pain; Chronic bronchiolitis; Chronic kidney disease; COPD; Fibromyalgia; Kidney failure; MRSA culture positive; Muscle spasm; Muscle spasms of both lower extremities; Panic attack; Pneumonia; Shortness of breath dyspnea; Spinal stenosis; and Wears dentures. has a past surgical history that includes Neck surgery; Abdominal hysterectomy (1999); Knee surgery (right 2008); Carpal tunnel release; Joint replacement (Right, 2010);  Anterior cervical decomp/discectomy fusion (09/10/2015).   Clinical Impression   PTA Pt independent in ADL and mobility. Pt currently mod A for ADL and supervision for mobility. OT shoulder protocol reviewe in handout reviewed in full (with demonstrations) with Pt and husband, they did not have any questions about education. Pt constantly tearful during session due to pain level, and requesting higher doses of medicine. Pt also had heat on her shoulder. Pt and husband educated on how this could impede healing and benefits of ice and Pt refused. "Everytime I put on ice I feel a burning and stabbing, all I want is heat". OT will continue to follow acutely to maximize safety and independence in ADL, conservative protocol, and HEP. Next session to focus on husband don/doff sling and HEP.     Follow Up Recommendations  No OT follow up;Supervision/Assistance - 24 hour (initially; progress rehab of shoulder as ordered by MD)    Equipment Recommendations  None recommended by OT    Recommendations for Other Services       Precautions / Restrictions Precautions Precautions: Shoulder Type of Shoulder Precautions: conservative Shoulder Interventions: Shoulder  sling/immobilizer Precaution Booklet Issued: Yes (comment) Precaution Comments: OT shoulder protocol worksheet reviewed in full Required Braces or Orthoses: Sling Restrictions Weight Bearing Restrictions: Yes LUE Weight Bearing: Non weight bearing      Mobility Bed Mobility Overal bed mobility: Modified Independent             General bed mobility comments: HOB elevated, no assist needed  Transfers Overall transfer level: Needs assistance Equipment used: None Transfers: Sit to/from Stand Sit to Stand: Supervision         General transfer comment: Pt with no attempt to use LUE    Balance Overall balance assessment: No apparent balance deficits (not formally assessed)                                          ADL Overall ADL's : Needs assistance/impaired                     Lower Body Dressing: Min guard;Sit to/from stand Lower Body Dressing Details (indicate cue type and reason): managing underwear, yoga pants Toilet Transfer: Min guard;Ambulation;Comfort height toilet   Toileting- Clothing Manipulation and Hygiene: Min guard;Sit to/from stand Toileting - Clothing Manipulation Details (indicate cue type and reason): managed underwear, yoga pants, and peri care Tub/ Shower Transfer: Tub transfer;Min guard;With caregiver independent assisting;Shower Scientist, research (medical) Details (indicate cue type and reason): Pt states she is very comfortable with her shower seat, and husband will be there to help Functional mobility during ADLs: Supervision/safety General ADL Comments: Please see shoulder section for further details     Vision Vision Assessment?: No apparent visual deficits   Perception  Praxis      Pertinent Vitals/Pain Pain Assessment: 0-10 Pain Score: 10-Worst pain ever Pain Location: L shoulder Pain Descriptors / Indicators: Aching;Crying;Constant;Grimacing;Penetrating;Sore;Throbbing;Shooting;Sharp Pain Intervention(s):  Monitored during session;Repositioned;Patient requesting pain meds-RN notified;Heat applied (Pt edcuated on difference between heat and ice)     Hand Dominance Left   Extremity/Trunk Assessment Upper Extremity Assessment Upper Extremity Assessment: LUE deficits/detail LUE Deficits / Details: s/p total reverse shoulder surgery LUE: Unable to fully assess due to pain;Unable to fully assess due to immobilization   Lower Extremity Assessment Lower Extremity Assessment: Overall WFL for tasks assessed (Pt says that she hasn't been able to feet feet in 3 years)   Cervical / Trunk Assessment Cervical / Trunk Assessment: Other exceptions Cervical / Trunk Exceptions: history of back surgery   Communication Communication Communication: No difficulties   Cognition Arousal/Alertness: Awake/alert Behavior During Therapy: WFL for tasks assessed/performed;Anxious (crying) Overall Cognitive Status: Within Functional Limits for tasks assessed                 General Comments: Pt VERY tearful about pain management, but agreeable to exercises   General Comments       Exercises Exercises: Shoulder     Shoulder Instructions Shoulder Instructions Donning/doffing shirt without moving shoulder: Maximal assistance;Caregiver independent with task;Patient able to independently direct caregiver Method for sponge bathing under operated UE: Supervision/safety Donning/doffing sling/immobilizer: Maximal assistance;Caregiver independent with task;Patient able to independently direct caregiver (Pt found in bed without sling, PA note stated "for comfort") Correct positioning of sling/immobilizer: Min-guard;Patient able to independently direct caregiver;Caregiver independent with task ROM for elbow, wrist and digits of operated UE: Modified independent Sling wearing schedule (on at all times/off for ADL's): Modified independent Proper positioning of operated UE when showering:  Supervision/safety Positioning of UE while sleeping: Moderate assistance;Caregiver independent with task;Patient able to independently direct caregiver (for assist with pillows behind shoulder)    Home Living Family/patient expects to be discharged to:: Private residence Living Arrangements: Spouse/significant other Available Help at Discharge: Family;Available 24 hours/day Type of Home: House Home Access: Stairs to enter [employer] of Steps: 3 small Entrance Stairs-Rails: None Home Layout: One level     Bathroom Shower/Tub: Tub/shower unit;Curtain Shower/tub characteristics: Architectural technologist: Standard     Home Equipment: Environmental consultant - 2 wheels;Walker - 4 wheels;Bedside commode;Shower seat          Prior Functioning/Environment Level of Independence: Independent                 OT Problem List: Decreased strength;Decreased range of motion;Decreased activity tolerance;Decreased safety awareness;Decreased knowledge of use of DME or AE;Decreased knowledge of precautions;Impaired UE functional use;Pain   OT Treatment/Interventions: Self-care/ADL training;DME and/or AE instruction;Therapeutic activities;Patient/family education    OT Goals(Current goals can be found in the care plan section) Acute Rehab OT Goals Patient Stated Goal: to have less pain OT Goal Formulation: With patient Time For Goal Achievement: 11/04/16 Potential to Achieve Goals: Good ADL Goals Pt Will Perform Upper Body Bathing: with modified independence;standing (using method taught by OT to clean underarm) Pt Will Perform Upper Body Dressing: with min guard assist;with caregiver independent in assisting;sitting (dressing LUE first without moving shoulder) Additional ADL Goal #1: Pt and husband will demonstrate competence in HEP provided by OT  OT Frequency: Min 2X/week   Barriers to D/C:    none       Co-evaluation              End of Session Equipment Utilized During  Treatment:  (  none) Nurse Communication: Patient requests pain meds;Mobility status;Weight bearing status  Activity Tolerance: Patient tolerated treatment well;Other (comment) (Pt very tearful for pain, but able to participate in therapy) Patient left: in bed;with call bell/phone within reach;with family/visitor present   Time: [id number] OT Time Calculation (min): 40 min Charges:  OT General Charges $OT Visit: 1 Procedure OT Evaluation $OT Eval Moderate Complexity: 1 Procedure OT Treatments $Self Care/Home Management : 8-22 mins $Therapeutic Exercise: 8-22 mins G-Codes:    Merri Ray Henri Baumler 10/15/16, 11:01 AM  Hulda Humphrey OTR/L [phone]

## 2016-09-30 NOTE — Progress Notes (Signed)
PT Cancellation Note  Patient Details Name: Jamie Schultz MRN: [id number] DOB: 1960-12-18   Cancelled Treatment:    Reason Eval/Treat Not Completed: PT screened, no needs identified, will sign off   Discussed pt with OT, who did not note any PT needs; [name], [place]  Acute Rehabilitation Services Pager [phone] Office [phone]    [name] 09/30/2016, 10:37 AM

## 2016-10-01 NOTE — Discharge Summary (Signed)
Physician Discharge Summary  Patient ID: Jamie Schultz MRN: [id number] DOB/AGE: 58/23/1962 58 y.o.  Admit date: 09/29/2016 Discharge date: 10/01/2016  Admission Diagnoses:  Left rotator cuff tear arthropathy  Discharge Diagnoses:  Principal Problem:   Left rotator cuff tear arthropathy Active Problems:   S/P shoulder replacement   Past Medical History:  Diagnosis Date  . Abscess   . ADHD   . Allergy   . Anxiety   . Arthritis   . Blood transfusion without reported diagnosis   . Chronic back pain   . Chronic bronchiolitis ([place])   . Chronic kidney disease   . Congenital prolapsed rectum   . COPD (chronic obstructive pulmonary disease) ([place])   . Fibromyalgia   . History of kidney stones   . Kidney failure    acute - reaction to sulfa drugs  . MRSA (methicillin resistant staph aureus) culture positive    Hx: of  . Muscle spasm   . Muscle spasms of both lower extremities   . Numbness   . Numbness and tingling of foot    Right  . Panic attack   . Pneumonia   . Shortness of breath dyspnea    WITH HUMITY  . Spinal stenosis    cervical region  . Wears dentures    full upper and lower    Surgeries: Procedure(s): TOTAL SHOULDER ARTHROPLASTY on 09/29/2016   Consultants (if any):   Discharged Condition: Improved  Hospital Course: Jamie Schultz is an 56 y.o. female who was admitted 09/29/2016 with a diagnosis of Left rotator cuff tear arthropathy and went to the operating room on 09/29/2016 and underwent the above named procedures.    She was given perioperative antibiotics:  Anti-infectives    Start     Dose/Rate Route Frequency Ordered Stop   09/29/16 2230  vancomycin (VANCOCIN) IVPB 1000 mg/200 mL premix     1,000 mg 200 mL/hr over 60 Minutes Intravenous Every 12 hours 09/29/16 1841 09/29/16 2236   09/29/16 0831  vancomycin (VANCOCIN) IVPB 1000 mg/200 mL premix     1,000 mg 200 mL/hr over 60 Minutes Intravenous On call to O.R. 09/29/16 0831 09/29/16 1038     .  She was given sequential compression devices, early ambulation,  for DVT prophylaxis.  We had some difficulty with pain management given longstanding narcotic exposure, but were ultimately able to control her pain.   She benefited maximally from the hospital stay and there were no complications.    Recent vital signs:  Vitals:   10/01/16 0512 10/01/16 0942  BP: 110/75   Pulse: 99 98  Resp: 20 20  Temp: 99.3 F (37.4 C)     Recent laboratory studies:  Lab Results  Component Value Date   HGB 11.1 (L) 09/30/2016   HGB 14.1 09/21/2016   HGB 13.2 04/24/2016   Lab Results  Component Value Date   WBC 9.8 09/30/2016   PLT 242 09/30/2016   Lab Results  Component Value Date   INR 1.0 06/14/2015   Lab Results  Component Value Date   NA 134 (L) 09/30/2016   K 3.9 09/30/2016   CL 102 09/30/2016   CO2 21 (L) 09/30/2016   BUN 8 09/30/2016   CREATININE 0.93 09/30/2016   GLUCOSE 124 (H) 09/30/2016    Discharge Medications:   Allergies as of 10/01/2016      Reactions   Bee Venom Anaphylaxis   Bees/wasps/yellow jackets   Keflex [cephalexin] Anaphylaxis   Penicillins Anaphylaxis   Has  patient had a PCN reaction causing immediate rash, facial/tongue/throat swelling, SOB or lightheadedness with hypotension: Yes Has patient had a PCN reaction causing severe rash involving mucus membranes or skin necrosis: No Has patient had a PCN reaction that required hospitalization Yes, in the hospital already Has patient had a PCN reaction occurring within the last 10 years: Yes If all of the above answers are "NO", then may proceed with Cephalosporin use.   Sulfa Antibiotics Other (See Comments)   Renal failure   Hydrocodone Rash, Other (See Comments)   "I couldn't get my breath"   Ibuprofen Other (See Comments)   Vomiting blood   Codeine Itching, Rash   Cyclobenzaprine Other (See Comments)   Severe constipation   Fentanyl Nausea And Vomiting   Severe vomiting   Methadone Other (See  Comments)   Change in mental status   Neurontin [gabapentin] Hives   Tape Itching, Rash   Blisters skin, Please use "paper" tape   Tegretol [carbamazepine] Hives, Rash   Toradol [ketorolac Tromethamine] Hives, Rash   Tramadol Hives, Rash   Tylenol [acetaminophen] Rash   Vomiting blood      Medication List    STOP taking these medications   meloxicam 15 MG tablet Commonly known as:  MOBIC     TAKE these medications   albuterol 108 (90 Base) MCG/ACT inhaler Commonly known as:  PROVENTIL HFA;VENTOLIN HFA Inhale 2 puffs into the lungs every 6 (six) hours as needed for wheezing or shortness of breath.   ALPRAZolam 1 MG tablet Commonly known as:  XANAX Take 1 mg by mouth 3 (three) times daily.   amphetamine-dextroamphetamine 15 MG tablet Commonly known as:  ADDERALL Take 1 tablet by mouth 2 (two) times daily.   BELSOMRA 20 MG Tabs Generic drug:  Suvorexant TAKE ONE TABLET BY MOUTH ONCE NIGHTLY as needed for insomnia   EPIPEN 2-PAK 0.3 mg/0.3 mL Soaj injection Generic drug:  EPINEPHrine USE AS DIRECTED FOR ANAPYLACTIC REACTION (TO BEE STINGS)   HYDROmorphone 2 MG tablet Commonly known as:  DILAUDID Take 1 tablet (2 mg total) by mouth every 4 (four) hours as needed for severe pain.   hydrOXYzine 25 MG tablet Commonly known as:  ATARAX/VISTARIL TAKE 1 TABLET (25 MG TOTAL) BY MOUTH EVERY 8 (EIGHT) HOURS AS NEEDED. What changed:  how much to take  how to take this  when to take this  reasons to take this  additional instructions   ibandronate 150 MG tablet Commonly known as:  BONIVA Take 1 tablet every 30 days in morning with full glass of water, on empty stomach (do not eat/drink or lie down for 30 min after)   ipratropium-albuterol 0.5-2.5 (3) MG/3ML Soln Commonly known as:  DUONEB Take 3 mLs by nebulization every 6 (six) hours. What changed:  when to take this  reasons to take this   LYRICA 100 MG capsule Generic drug:  pregabalin Take 1 capsule (100  mg total) by mouth 3 (three) times daily.   methocarbamol 500 MG tablet Commonly known as:  ROBAXIN Take 1 tablet (500 mg total) by mouth 4 (four) times daily.   mometasone 50 MCG/ACT nasal spray Commonly known as:  NASONEX PLACE 2 SPRAYS EACH NOSTRIL EVERY DAY What changed:  how much to take  how to take this  when to take this  reasons to take this  additional instructions   mometasone-formoterol 200-5 MCG/ACT Aero Commonly known as:  DULERA Inhale 2 puffs into the lungs 2 (two) times daily.  multivitamin with minerals Tabs tablet Take 1 tablet by mouth daily.   ondansetron 4 MG tablet Commonly known as:  ZOFRAN Take 1 tablet (4 mg total) by mouth every 8 (eight) hours as needed for nausea or vomiting.   Oxycodone HCl 10 MG Tabs Take 1.5-3 tablets (15-30 mg total) by mouth 4 (four) times daily as needed (severe pain). What changed:  See the new instructions.   promethazine 25 MG tablet Commonly known as:  PHENERGAN Take 1 tablet (25 mg total) by mouth 2 (two) times daily as needed for nausea or vomiting.   QUEtiapine 400 MG tablet Commonly known as:  SEROQUEL Takes 800mg  by mouth at bedtime.   sennosides-docusate sodium 8.6-50 MG tablet Commonly known as:  SENOKOT-S Take 2 tablets by mouth daily.   tiZANidine 4 MG tablet Commonly known as:  ZANAFLEX Take 0.5-1 tablets (2-4 mg total) by mouth every 8 (eight) hours as needed for muscle spasms.       Diagnostic Studies: Ct Shoulder Left Wo Contrast  Result Date: 09/28/2016 CLINICAL DATA:  Left shoulder pain and decreased range of motion. EXAM: CT OF THE UPPER LEFT EXTREMITY WITHOUT CONTRAST TECHNIQUE: Multidetector CT imaging of the upper left extremity was performed according to the standard protocol. COMPARISON:  None. FINDINGS: Bones/Joint/Cartilage There is moderate AC joint arthropathy. Previous acromioplasty. Moderately severe glenohumeral joint arthropathy with calcified loose bodies in the joint.  Superior subluxation of the humeral head with respect to the glenoid. The humeral head articulates with the undersurface of the acromial remnant. Type 3 acromion. Muscles and Tendons Chronic large full-thickness rotator cuff tear. Marked atrophy of the infraspinatus, subscapularis, and supraspinous muscles. IMPRESSION: 1. Moderately severe glenohumeral joint arthritis. Calcified loose bodies in the joint. 2. Large chronic full-thickness rotator cuff tears with marked atrophy of the infraspinatus, supraspinous and subscapularis muscles. Electronically Signed   By: Lorriane Shire M.D.   On: 09/28/2016 11:30   Dg Shoulder Left Port  Result Date: 09/29/2016 CLINICAL DATA:  Left shoulder arthroplasty. EXAM: LEFT SHOULDER - 1 VIEW COMPARISON:  None. FINDINGS: Postsurgical changes in the left shoulder. Patient status post arthroplasty. Hardware intact. Anatomic alignment. Degenerative changes left shoulder. Prior cervical spine fusion . ScratchedLeft base subsegmental atelectasis. IMPRESSION: Postsurgical changes left shoulder. Hardware intact. Anatomic alignment. Electronically Signed   By: Marcello Moores  Register   On: 09/29/2016 13:59    Disposition: 01-Home or Self Care    Follow-up Information    Johnny Bridge, MD. Schedule an appointment as soon as possible for a visit in 2 week(s).   Specialty:  Orthopedic Surgery Contact information: [place] [address] [phone]            Signed: Johnny Bridge 10/01/2016, 9:53 AM

## 2016-10-01 NOTE — Progress Notes (Signed)
Orthopedic Tech Progress Note Patient Details:  Jamie Schultz 10-29-1960 [id number]  Ortho Devices Type of Ortho Device: Soft collar Ortho Device/Splint Location: Applied Soft collar to pt neck as ordered. Pt tolerated well.  Family at bedside at this time.  Ortho Device/Splint Interventions: Application, Adjustment   Kristopher Oppenheim 10/01/2016, 9:29 AM

## 2016-10-01 NOTE — Progress Notes (Signed)
Occupational Therapy Treatment and Discharge Patient Details Name: Jamie Schultz MRN: [id number] DOB: July 26, 1961 Today's Date: 10/01/2016    History of present illness 56 y/o female presenting post op left reverse shoulder arthroplasty. Pt has a past medical history including  Abscess; ADHD;Anxiety; Arthritis; Chronic back pain; Chronic bronchiolitis; Chronic kidney disease; COPD; Fibromyalgia; Kidney failure; MRSA culture positive; Muscle spasm; Muscle spasms of both lower extremities; Panic attack; Pneumonia; Shortness of breath dyspnea; Spinal stenosis; and Wears dentures. has a past surgical history that includes Neck surgery; Abdominal hysterectomy (1999); Knee surgery (right 2008); Carpal tunnel release; Joint replacement (Right, 2010);  Anterior cervical decomp/discectomy fusion (09/10/2015).   OT comments  Pt up walking around the room fully dressed. Pain much better this session, no tears. Pt able to answer all questions about safety and compensatory strategies when "quizzed" by OT. Pt able to perform AROM of elbow, wrist, and fingers. OT assessment and education complete, goals achieved, OT to sign off at this point.   Follow Up Recommendations  No OT follow up;Supervision/Assistance - 24 hour (initially)    Equipment Recommendations  None recommended by OT    Recommendations for Other Services      Precautions / Restrictions Precautions Precautions: Shoulder Type of Shoulder Precautions: conservative Shoulder Interventions: Shoulder sling/immobilizer Precaution Booklet Issued: Yes (comment) Precaution Comments: OT shoulder protocol worksheet reviewed in full Required Braces or Orthoses: Sling Restrictions Weight Bearing Restrictions: Yes LUE Weight Bearing: Non weight bearing       Mobility Bed Mobility Overal bed mobility: Modified Independent             General bed mobility comments: HOB elevated, no assist needed  Transfers Overall transfer level: Modified  independent Equipment used: None             General transfer comment: Pt with no attempt to use LUE    Balance Overall balance assessment: No apparent balance deficits (not formally assessed)                                 ADL Overall ADL's : Needs assistance/impaired                         Toilet Transfer: Supervision/safety;Ambulation;Comfort height toilet Toilet Transfer Details (indicate cue type and reason): no sling due to diascomfort at IV site in neck Toileting- Clothing Manipulation and Hygiene: Supervision/safety;Sit to/from stand Toileting - Clothing Manipulation Details (indicate cue type and reason): managed underwear, yoga pants, and peri care     Functional mobility during ADLs: Supervision/safety General ADL Comments: Please see shoulder section for further details      Vision                     Perception     Praxis      Cognition   Behavior During Therapy: [facility] for tasks assessed/performed Overall Cognitive Status: Within Functional Limits for tasks assessed                  General Comments: Pt expressed specifically how much better pain was today    Extremity/Trunk Assessment               Exercises Shoulder Exercises Elbow Flexion: AROM;Left;10 reps;Seated Elbow Extension: AROM;Left;10 reps;Seated Wrist Flexion: AROM;Left;10 reps Wrist Extension: AROM;Left;10 reps Digit Composite Flexion: AROM;Left;10 reps Composite Extension: AROM;Left;10 reps Neck Flexion: AROM Neck Extension: AROM Neck Lateral  Flexion - Right: AROM Neck Lateral Flexion - Left: AROM Donning/doffing shirt without moving shoulder: Moderate assistance;Caregiver independent with task;Patient able to independently direct caregiver Method for sponge bathing under operated UE: Modified independent Donning/doffing sling/immobilizer: Minimal assistance;Caregiver independent with task;Patient able to independently direct  caregiver Correct positioning of sling/immobilizer: Modified independent ROM for elbow, wrist and digits of operated UE: Modified independent (demonstrated for OT without cues) Sling wearing schedule (on at all times/off for ADL's): Modified independent Proper positioning of operated UE when showering: Supervision/safety Positioning of UE while sleeping: Moderate assistance;Caregiver independent with task;Patient able to independently direct caregiver (for assist with pillows and supports surrounding shoulder)   Shoulder Instructions Shoulder Instructions Donning/doffing shirt without moving shoulder: Moderate assistance;Caregiver independent with task;Patient able to independently direct caregiver Method for sponge bathing under operated UE: Modified independent Donning/doffing sling/immobilizer: Minimal assistance;Caregiver independent with task;Patient able to independently direct caregiver Correct positioning of sling/immobilizer: Modified independent ROM for elbow, wrist and digits of operated UE: Modified independent (demonstrated for OT without cues) Sling wearing schedule (on at all times/off for ADL's): Modified independent Proper positioning of operated UE when showering: Supervision/safety Positioning of UE while sleeping: Moderate assistance;Caregiver independent with task;Patient able to independently direct caregiver (for assist with pillows and supports surrounding shoulder)     General Comments      Pertinent Vitals/ Pain       Pain Assessment: 0-10 Pain Score: 7  Pain Location: L shoulder Pain Descriptors / Indicators: Sore;Grimacing Pain Intervention(s): Monitored during session;Repositioned  Home Living                                          Prior Functioning/Environment              Frequency  Min 2X/week        Progress Toward Goals  OT Goals(current goals can now be found in the care plan section)  Progress towards OT goals:  Goals met/education completed, patient discharged from OT  Acute Rehab OT Goals Patient Stated Goal: to get home OT Goal Formulation: With patient Time For Goal Achievement: 11/04/16 Potential to Achieve Goals: Good  Plan All goals met and education completed, patient discharged from OT services    Co-evaluation                 End of Session     Activity Tolerance Patient tolerated treatment well   Patient Left in bed;with call bell/phone within reach;with family/visitor present   Nurse Communication Mobility status;Other (comment) (OT complete)        Time: 5670-1410 OT Time Calculation (min): 16 min  Charges: OT General Charges $OT Visit: 1 Procedure OT Treatments $Self Care/Home Management : 8-22 mins  Jamie Schultz 10/01/2016, 9:14 AM  Hulda Humphrey OTR/L [phone]

## 2016-10-01 NOTE — Progress Notes (Signed)
Discharge instructions and prescriptions reviewed with patient. Patient stated understanding. IV removed. Patient has family at bedside for transportation 

## 2016-10-01 NOTE — Care Management (Signed)
Patient has no Home Health needs identified. Ricki Miller, RN BSN Case Manager

## 2016-10-01 NOTE — Progress Notes (Signed)
Patient ID: Jamie Schultz, female   DOB: Sep 25, 1960, 56 y.o.   MRN: [id number]     Subjective:  Patient reports pain as mild.  Patient stats that there has been much improvement, and she is ready to go home.  Objective:   VITALS:   Vitals:   09/30/16 2055 09/30/16 2136 09/30/16 2144 10/01/16 0512  BP:  104/68  110/75  Pulse: 88 (!) 101  99  Resp: 18 20  20   Temp:  99.3 F (37.4 C)  99.3 F (37.4 C)  TempSrc:  Oral  Oral  SpO2: 96% (!) 89% 93% 90%  Weight:        ABD soft Sensation intact distally Dorsiflexion/Plantar flexion intact Incision: dressing C/D/I and no drainage Sling for comfort NWB left upper ext.  Lab Results  Component Value Date   WBC 9.8 09/30/2016   HGB 11.1 (L) 09/30/2016   HCT 34.4 (L) 09/30/2016   MCV 91.5 09/30/2016   PLT 242 09/30/2016   BMET    Component Value Date/Time   NA 134 (L) 09/30/2016 0600   NA 141 09/04/2015 1130   NA 142 06/13/2014 2036   K 3.9 09/30/2016 0600   K 4.2 06/13/2014 2036   CL 102 09/30/2016 0600   CL 110 (H) 06/13/2014 2036   CO2 21 (L) 09/30/2016 0600   CO2 27 06/13/2014 2036   GLUCOSE 124 (H) 09/30/2016 0600   GLUCOSE 114 (H) 06/13/2014 2036   BUN 8 09/30/2016 0600   BUN 9 09/04/2015 1130   BUN 8 06/13/2014 2036   CREATININE 0.93 09/30/2016 0600   CREATININE 0.99 04/24/2016 1043   CALCIUM 8.2 (L) 09/30/2016 0600   CALCIUM 9.5 06/13/2014 2036   GFRNONAA >60 09/30/2016 0600   GFRNONAA 63 04/24/2016 1043   GFRAA >60 09/30/2016 0600   GFRAA 73 04/24/2016 1043     Assessment/Plan: 2 Days Post-Op   Principal Problem:   Left rotator cuff tear arthropathy Active Problems:   S/P shoulder replacement   Advance diet Up with therapy  Discharge home NWB left upper ext Dry dressing PRN Follow up with Dr Mardelle Matte in 2 weeks   Remonia Richter 10/01/2016, 6:45 AM   Marchia Bond, MD Cell [phone]

## 2016-10-07 ENCOUNTER — Telehealth: Payer: Self-pay | Admitting: Family Medicine

## 2016-10-07 ENCOUNTER — Ambulatory Visit (INDEPENDENT_AMBULATORY_CARE_PROVIDER_SITE_OTHER): Payer: Medicaid Other | Admitting: Family Medicine

## 2016-10-07 ENCOUNTER — Other Ambulatory Visit: Payer: Self-pay | Admitting: *Deleted

## 2016-10-07 ENCOUNTER — Encounter: Payer: Self-pay | Admitting: Family Medicine

## 2016-10-07 ENCOUNTER — Encounter: Payer: Self-pay | Admitting: *Deleted

## 2016-10-07 VITALS — BP 101/62 | HR 88 | Temp 98.5°F | Resp 16 | Ht 60.0 in | Wt 148.0 lb

## 2016-10-07 DIAGNOSIS — E782 Mixed hyperlipidemia: Secondary | ICD-10-CM

## 2016-10-07 DIAGNOSIS — J432 Centrilobular emphysema: Secondary | ICD-10-CM | POA: Diagnosis not present

## 2016-10-07 DIAGNOSIS — R7303 Prediabetes: Secondary | ICD-10-CM | POA: Diagnosis not present

## 2016-10-07 DIAGNOSIS — Z9889 Other specified postprocedural states: Secondary | ICD-10-CM

## 2016-10-07 DIAGNOSIS — M7989 Other specified soft tissue disorders: Secondary | ICD-10-CM

## 2016-10-07 DIAGNOSIS — E559 Vitamin D deficiency, unspecified: Secondary | ICD-10-CM

## 2016-10-07 MED ORDER — FUROSEMIDE 20 MG PO TABS: 20.0000 mg | ORAL_TABLET | Freq: Every day | ORAL | 0 refills | Status: DC | PRN

## 2016-10-07 NOTE — Telephone Encounter (Signed)
Patient notified.[place] 

## 2016-10-07 NOTE — Assessment & Plan Note (Signed)
Unclear exact etiology, likely post-op and sedentary, no evidence to support acute DVT, symmetrical non tender calves, only asymmetry is L ankle > compared to R, Well's Score DVT 1 low risk. No prior DVT/PE. Prior labs recently normal kidney function, and without known cardiac etiology. - Also clinically not consistent with PE in post op setting, HR is 88 today no tachycardia, without other acute symptoms, and hemodynamically stable, Well's Score low risk  Plan: 1. Reassurance, likely with sedentary limited ambulation 2. Rx short term Lasix 20mg  daily PRN for 3-5 days 3. ICE therapy - ice, compression, elevation, and should ambulate more to stay active post-op 4. Follow-up if not improving, return criteria given

## 2016-10-07 NOTE — Progress Notes (Signed)
Subjective:    Patient ID: Jamie Schultz, female    DOB: 06-26-1961, 56 y.o.   MRN: [id number]  Jamie Schultz is a 56 y.o. female presenting on 10/07/2016 for Edema and Wheezing  Patient presents for a same day appointment.  HPI   COUGHING / WHEEZING / Lower Extremity Swelling / Post-op L shoulder rotator cuff repair - Recent significant history with chronic L shoulder rotator cuff arthropathy with tear, as major source of pain, she is now s/p total L rotator cuff replacement surgery on 09/29/16 per [facility], Dr Mardelle Matte. See hospital record for details on surgery and post-op course. - Today reports concerns with some acute worsening bilateral lower extremity edema, previously had not been a problem, but she did endorse significant left upper extremity and hand edema due to wearing sling and post-op, within few days following surgery, also with ecchymosis, which have improved. She saw [facility] yesterday already. Next post-op apt in 1 week - She has been relatively inactive following her surgery. - No prior history of DVT / PE - Additionally concern with some worsening cough and reported wheezing, she wanted to be checked out for possible pneumonia - Admits slight fever, not recorded - Denies any chills, sweats, sinus pain or pressure, chest pain or pressure, nausea, vomiting, lower extremity redness, trauma fall or injury  COPD with chronic tobacco abuse history - Currently on Dulera, has Albuterol inhaler and nebulizer PRN, previously followed by Dr Wallene Huh at [facility] (approx 2016) but by chart review unable to return to their clinic due to financial limitations. She has not seen Pulmonology since. - Prior Spirometry 2008 with normal results, do not have any more recent PFTs available - No supplemental oxygen - Previously seen patient 07/27/16 for acute COPD exacerbation, she had reportedly been smoke free at that time, but  around second hand smoke  Additional History: - Pre-Diabetes with elevated A1c 6.3 and 6.1, requesting repeat labs before next follow-up A1c, and due for fasting lipids, Vitamin D since last visit. Awaiting DEXA scan (previously ordered), she was denied Boniva.  Social History  Substance Use Topics  . Smoking status: Current Some Day Smoker    Packs/day: 0.25    Years: 15.00    Types: Cigarettes    Last attempt to quit: 01/13/2016  . Smokeless tobacco: Never Used  . Alcohol use No    Review of Systems Per HPI unless specifically indicated above     Objective:    BP 101/62 (BP Location: Right Arm, Patient Position: Sitting, Cuff Size: Normal)   Pulse 88   Temp 98.5 F (36.9 C) (Oral)   Resp 16   Ht 5' (1.524 m)   Wt 148 lb (67.1 kg)   SpO2 94%   BMI 28.90 kg/m   Wt Readings from Last 3 Encounters:  10/07/16 148 lb (67.1 kg)  09/29/16 135 lb (61.2 kg)  09/21/16 135 lb 11.2 oz (61.6 kg)    Physical Exam  Constitutional: She is oriented to person, place, and time. She appears well-developed and well-nourished. No distress.  Well-appearing, comfortable, cooperative  HENT:  Head: Normocephalic and atraumatic.  Mouth/Throat: Oropharynx is clear and moist.  Cardiovascular: Normal rate, regular rhythm, normal heart sounds and intact distal pulses.   No murmur heard. Pulmonary/Chest: Effort normal and breath sounds normal. No respiratory distress. She has no wheezes. She has no rales.  Occasional significant coughing during exam. Mildly reduced air movement. No overt exp  wheezing and no focal crackles. Speaks full sentences.  Musculoskeletal: She exhibits edema (bilateral lower extremity edema lower leg and ankles, calves symmetrical 39 cm circumference no erythema non tender, bilateral ankels swollen L > R mild discomfort, no erythema).  Left shoulder in sling s/p recent shoulder surgery, not fully examined today  Neurological: She is alert and oriented to person, place, and  time.  Skin: Skin is warm and dry. No rash noted. She is not diaphoretic. No erythema.  Psychiatric: Her behavior is normal.  Nursing note and vitals reviewed.   I have personally reviewed the following lab results from 09/29/16.  Results for orders placed or performed during the hospital encounter of 09/29/16  CBC  Result Value Ref Range   WBC 9.8 4.0 - 10.5 K/uL   RBC 3.76 (L) 3.87 - 5.11 MIL/uL   Hemoglobin 11.1 (L) 12.0 - 15.0 g/dL   HCT 34.4 (L) 36.0 - 46.0 %   MCV 91.5 78.0 - 100.0 fL   MCH 29.5 26.0 - 34.0 pg   MCHC 32.3 30.0 - 36.0 g/dL   RDW 16.0 (H) 11.5 - 15.5 %   Platelets 242 150 - 400 K/uL  Basic metabolic panel  Result Value Ref Range   Sodium 134 (L) 135 - 145 mmol/L   Potassium 3.9 3.5 - 5.1 mmol/L   Chloride 102 101 - 111 mmol/L   CO2 21 (L) 22 - 32 mmol/L   Glucose, Bld 124 (H) 65 - 99 mg/dL   BUN 8 6 - 20 mg/dL   Creatinine, Ser 0.93 0.44 - 1.00 mg/dL   Calcium 8.2 (L) 8.9 - 10.3 mg/dL   GFR calc non Af Amer >60 >60 mL/min   GFR calc Af Amer >60 >60 mL/min   Anion gap 11 5 - 15  Glucose, capillary  Result Value Ref Range   Glucose-Capillary 114 (H) 65 - 99 mg/dL  Glucose, capillary  Result Value Ref Range   Glucose-Capillary 169 (H) 65 - 99 mg/dL  Glucose, capillary  Result Value Ref Range   Glucose-Capillary 139 (H) 65 - 99 mg/dL      Assessment & Plan:   Problem List Items Addressed This Visit    Vitamin D deficiency    History of osteoporosis reported, and prior vitamin D deficiency since 2009, no recent lab values - S/p prior treatment on Boniva, this was recently denied by PA, rec to try preferred options Alendronate vs Evista first  Plan: 1. Check future vitamin D 2. Advised to schedule her DEXA scan, ordered back in 07/2016 she has not scheduled yet, to confirm if osteoporosis, last DEXA 2014 was negative 3. Follow-up vitamin D and DEXA determine treatment, likely can do bisphosphonate if needed       Relevant Orders   VITAMIN D 25  Hydroxy (Vit-D Deficiency, Fractures)   Swelling of both lower extremities    Unclear exact etiology, likely post-op and sedentary, no evidence to support acute DVT, symmetrical non tender calves, only asymmetry is L ankle > compared to R, Well's Score DVT 1 low risk. No prior DVT/PE. Prior labs recently normal kidney function, and without known cardiac etiology. - Also clinically not consistent with PE in post op setting, HR is 88 today no tachycardia, without other acute symptoms, and hemodynamically stable, Well's Score low risk  Plan: 1. Reassurance, likely with sedentary limited ambulation 2. Rx short term Lasix 20mg  daily PRN for 3-5 days 3. ICE therapy - ice, compression, elevation, and should ambulate more to  stay active post-op 4. Follow-up if not improving, return criteria given      Relevant Medications   furosemide (LASIX) 20 MG tablet   Prediabetes    Last A1c 6.1 in 05/2016, she had prior 6.3, and consistent with Pre-DM, previously on metformin has remained off of this Check future A1c with upcoming labs, will not check today due to SDA for other more acute concerns      Relevant Orders   Hemoglobin A1c   Hyperlipidemia    Future fasting lipid panel ordered Prior history on Pravastatin, no recent lipid panel results available      Relevant Medications   furosemide (LASIX) 20 MG tablet   Other Relevant Orders   Lipid panel   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 No wheezing on exam. SpO2 on RA 94%, no clinical sputum production or dyspnea. - Continues on [place]: 1. Encourage remain smoke free, but long history of smoking and significant 2nd hand smoke at home 2. Check CXR for evaluate possible PNA given patient concerns in post op setting and some possible subjective fevers 3. Follow-up CXR results, consider azithromycin if needed, previously tried Levaquin last AECOPD but did feel sick on this med. Future consider referral  to [facility] for further COPD management if needed if cannot return to [facility]      Relevant Orders   DG Chest 2 View    Other Visit Diagnoses    Post-operative state       Cannot rule out PNA, clinically stable without focal findings, given patient concern and post-op setting will check CXR.   Relevant Orders   DG Chest 2 View      Meds ordered this encounter  Medications  . furosemide (LASIX) 20 MG tablet    Sig: Take 1 tablet (20 mg total) by mouth daily as needed for edema. For up to 5 days for swelling.    Dispense:  5 tablet    Refill:  0      Follow up plan: Return in about 4 weeks (around 11/04/2016), or if symptoms worsen or fail to improve, for swelling, lab results.  Nobie Putnam, [facility] 10/07/2016, 6:23 PM

## 2016-10-07 NOTE — Assessment & Plan Note (Signed)
History of osteoporosis reported, and prior vitamin D deficiency since 2009, no recent lab values - S/p prior treatment on Boniva, this was recently denied by PA, rec to try preferred options Alendronate vs Evista first  Plan: 1. Check future vitamin D 2. Advised to schedule her DEXA scan, ordered back in 07/2016 she has not scheduled yet, to confirm if osteoporosis, last DEXA 2014 was negative 3. Follow-up vitamin D and DEXA determine treatment, likely can do bisphosphonate if needed

## 2016-10-07 NOTE — Patient Instructions (Signed)
Thank you for coming in to clinic today.  1. Reassurance, I think it is less likely to be pneumonia, but we will check Chest X-ray today and notify you of results, if positive will send likely Azithromycin if needed, possibly early developing pneumonia  2. For swelling in lower extremities - I don't think you have a blood clot as discussed, however, this is higher risk after any surgery. Try to stay active as tolerated. - If you get significantly worse swelling in one leg with redness and pain in calve then notify office or seek more immediate treatment at hospital  For now try [place] (Furosemide) 20mg  daily for next 3-5 days as needed for swelling  Use ICE therapy: - I - Ice packs (make sure you use a towel or sock / something to protect skin) - C - Compression with ACE wrap to apply pressure and reduce swelling allowing more support - E - Elevation - if significant swelling, lift leg above heart level (toes above your nose) to help reduce swelling, most helpful at night after day of being on your feet  You will be due for FASTING BLOOD WORK (no food or drink after midnight before, only water or coffee without cream/sugar on the morning of)  - Please go ahead and schedule a "Lab Only" visit in the morning at the clinic for lab draw in 2 days - Make sure Lab Only appointment is at least 1-2 weeks before your next appointment, so that results will be available  Please schedule a follow-up appointment with Dr. Parks Ranger in 2-4 weeks as needed for swelling lab results  If you have any other questions or concerns, please feel free to call the clinic or send a message through [place]. You may also schedule an earlier appointment if necessary.  Nobie Putnam, DO [place]

## 2016-10-07 NOTE — Telephone Encounter (Signed)
Patient with prior reported history of low bone mineral density unsure if considered dx osteoporosis, last DEXA 2014 with normal results, she previously took [place] for period of time and requested to resume this at last visit 08/14/16, this was ordered but denied after attempted Prior Authorization, recommended to try preferred drugs Alendronate or Evista first.  I had ordered repeat DEXA at that time, order is still active.  Please notify patient that Jamie Schultz was denied. She should proceed with DEXA scan next, and then in future we can check Vitamin D as well. And discuss Alendronate therapy.  Jamie Schultz, [facility] 10/07/2016, 9:16 AM

## 2016-10-07 NOTE — Assessment & Plan Note (Signed)
Last A1c 6.1 in 05/2016, she had prior 6.3, and consistent with Pre-DM, previously on metformin has remained off of this Check future A1c with upcoming labs, will not check today due to [place] for other more acute concerns

## 2016-10-07 NOTE — Assessment & Plan Note (Signed)
Stable without acute COPD exac today. No wheezing on exam. SpO2 on RA 94%, no clinical sputum production or dyspnea. - Continues on [place]: 1. Encourage remain smoke free, but long history of smoking and significant 2nd hand smoke at home 2. Check CXR for evaluate possible PNA given patient concerns in post op setting and some possible subjective fevers 3. Follow-up CXR results, consider azithromycin if needed, previously tried Levaquin last AECOPD but did feel sick on this med. Future consider referral to [facility] for further COPD management if needed if cannot return to [facility]

## 2016-10-07 NOTE — Assessment & Plan Note (Signed)
Future fasting lipid panel ordered Prior history on Pravastatin, no recent lipid panel results available

## 2016-10-09 ENCOUNTER — Ambulatory Visit: Payer: Medicaid Other | Admitting: Family Medicine
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
## 2016-11-23 IMAGING — DX DG CHEST 1V PORT
1 series · 1 of 1 positions shown · non-contrast
Comparison: Chest radiograph performed 03/23/2013

CLINICAL DATA: Acute onset shortness of breath.  Initial encounter.

EXAM:
PORTABLE CHEST 1 VIEW

[chest ap]
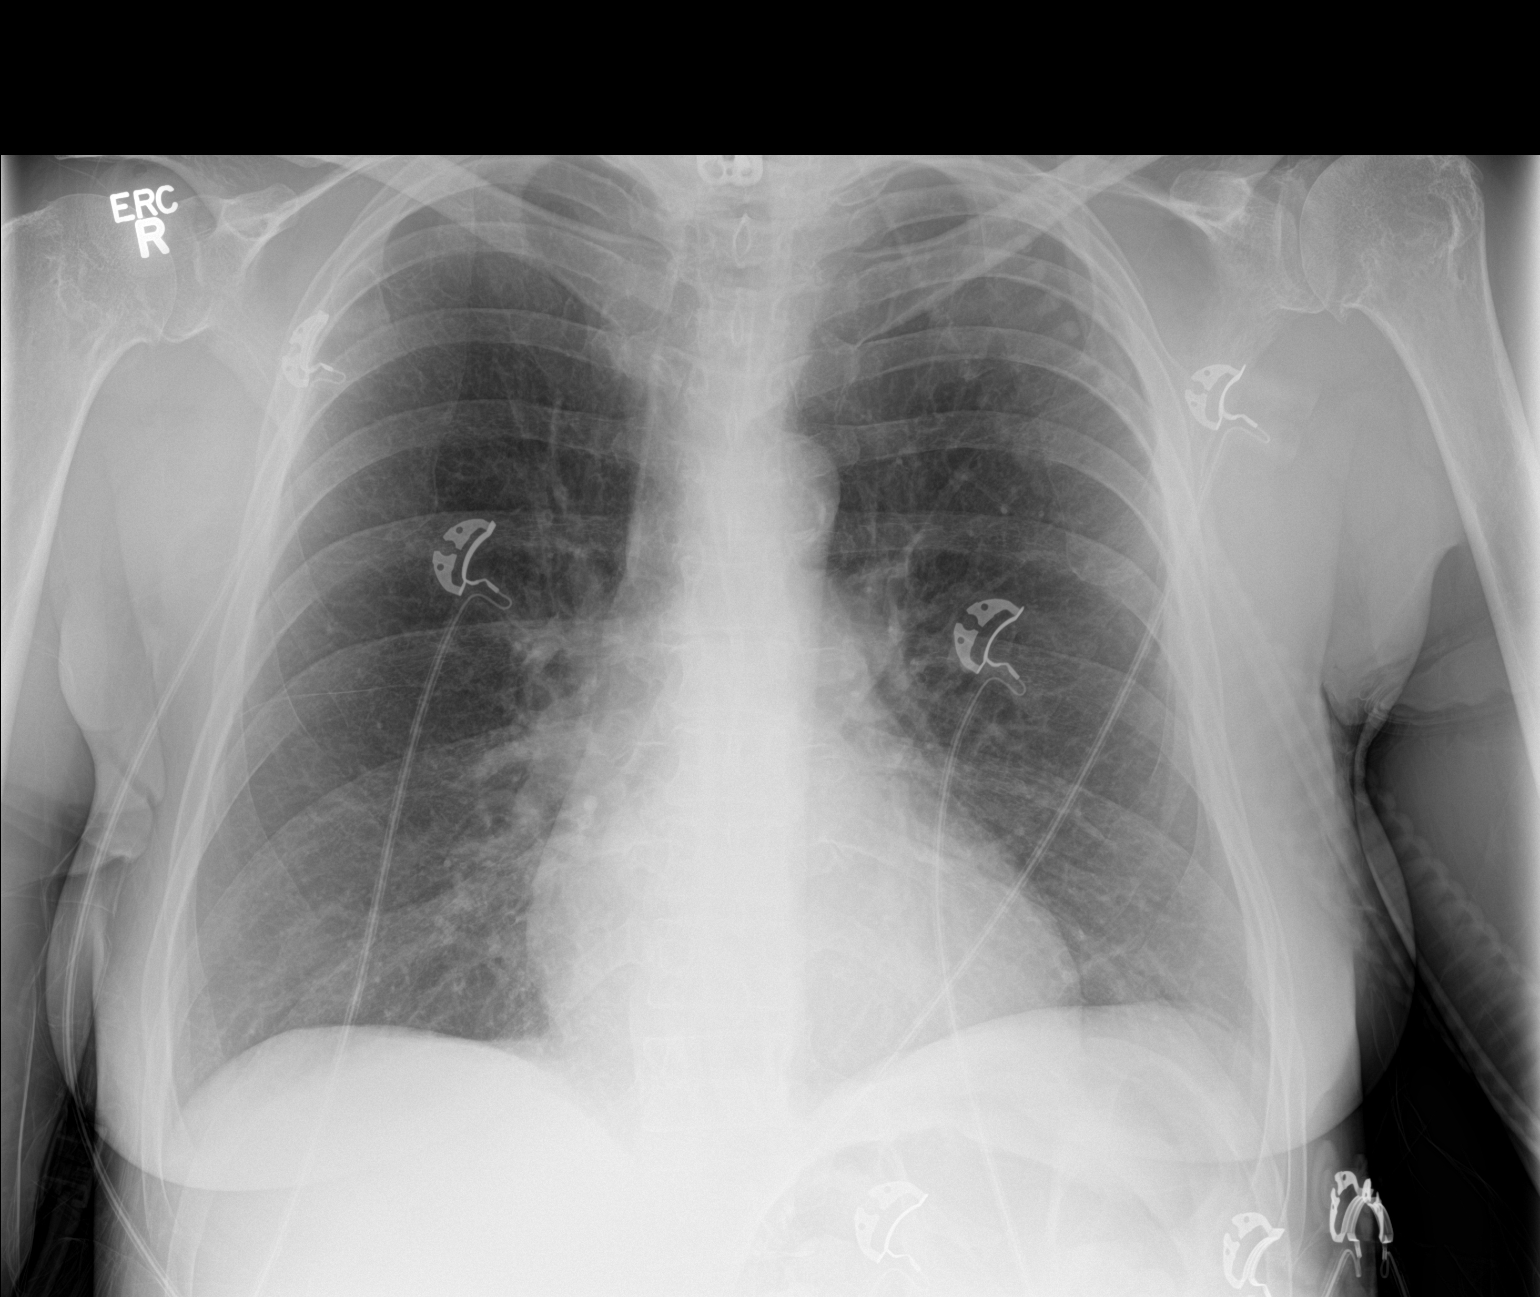

[1 of 1 positions shown; findings below may reference images not displayed]

FINDINGS: The lungs are well-aerated. Pulmonary vascularity is at the upper
limits of normal. Mild bibasilar atelectasis is noted. There is no
evidence of pleural effusion or pneumothorax.

The cardiomediastinal silhouette is within normal limits. No acute
osseous abnormalities are seen. Cervical spinal fusion hardware is
noted.
IMPRESSION: Mild bibasilar atelectasis noted.  Lungs otherwise clear.

## 2016-12-01 ENCOUNTER — Other Ambulatory Visit: Payer: Self-pay | Admitting: Family Medicine

## 2016-12-01 DIAGNOSIS — R110 Nausea: Secondary | ICD-10-CM

## 2016-12-01 MED ORDER — PROMETHAZINE HCL 25 MG PO TABS: 25.0000 mg | ORAL_TABLET | Freq: Two times a day (BID) | ORAL | 2 refills | Status: DC | PRN

## 2016-12-03 ENCOUNTER — Other Ambulatory Visit: Payer: Self-pay | Admitting: Family Medicine

## 2016-12-03 DIAGNOSIS — M5441 Lumbago with sciatica, right side: Principal | ICD-10-CM

## 2016-12-03 DIAGNOSIS — M5442 Lumbago with sciatica, left side: Principal | ICD-10-CM

## 2016-12-03 DIAGNOSIS — G8929 Other chronic pain: Secondary | ICD-10-CM

## 2016-12-03 MED ORDER — LYRICA 100 MG PO CAPS: 100.0000 mg | ORAL_CAPSULE | Freq: Three times a day (TID) | ORAL | 5 refills | Status: DC

## 2016-12-03 NOTE — Telephone Encounter (Signed)
Received fax for new request Lyrica rx with old providers name on it (Amy Krebs FNP), this was declined and advised need to initiate new rx or patient can return to office.  I have reviewed her L[place] Whitman for 1 year, she has received medicines from variety of providers, primarily pain management and prior PCP.  Patient called today and reviewed this history, she has chronically been on lyrica 100mg  TID from prior PCP did well for her neuropathy and pain in legs. She is followed by [facility] but they are prescribing her other medications for pain and not the Lyrica.  She requests a refill to be for monthly supply #90 pills +5 refills and printed, faxed to [facility]. Will send this today.  Nobie Putnam, [facility] 12/03/2016, 12:00 PM

## 2016-12-07 ENCOUNTER — Ambulatory Visit: Payer: Medicaid Other | Admitting: Family Medicine

## 2016-12-07 ENCOUNTER — Other Ambulatory Visit: Payer: Self-pay | Admitting: *Deleted

## 2016-12-15 IMAGING — RF DG LUMBAR SPINE 2-3V
1 series · 2 of 2 positions shown · non-contrast
Comparison: 03/24/2016

CLINICAL DATA: PLIF L4-5.

EXAM:
LUMBAR SPINE - 2-3 VIEW; DG C-ARM 61-120 MIN

[Series 1: run · 2 of 2 slices shown]
[im 1/2]
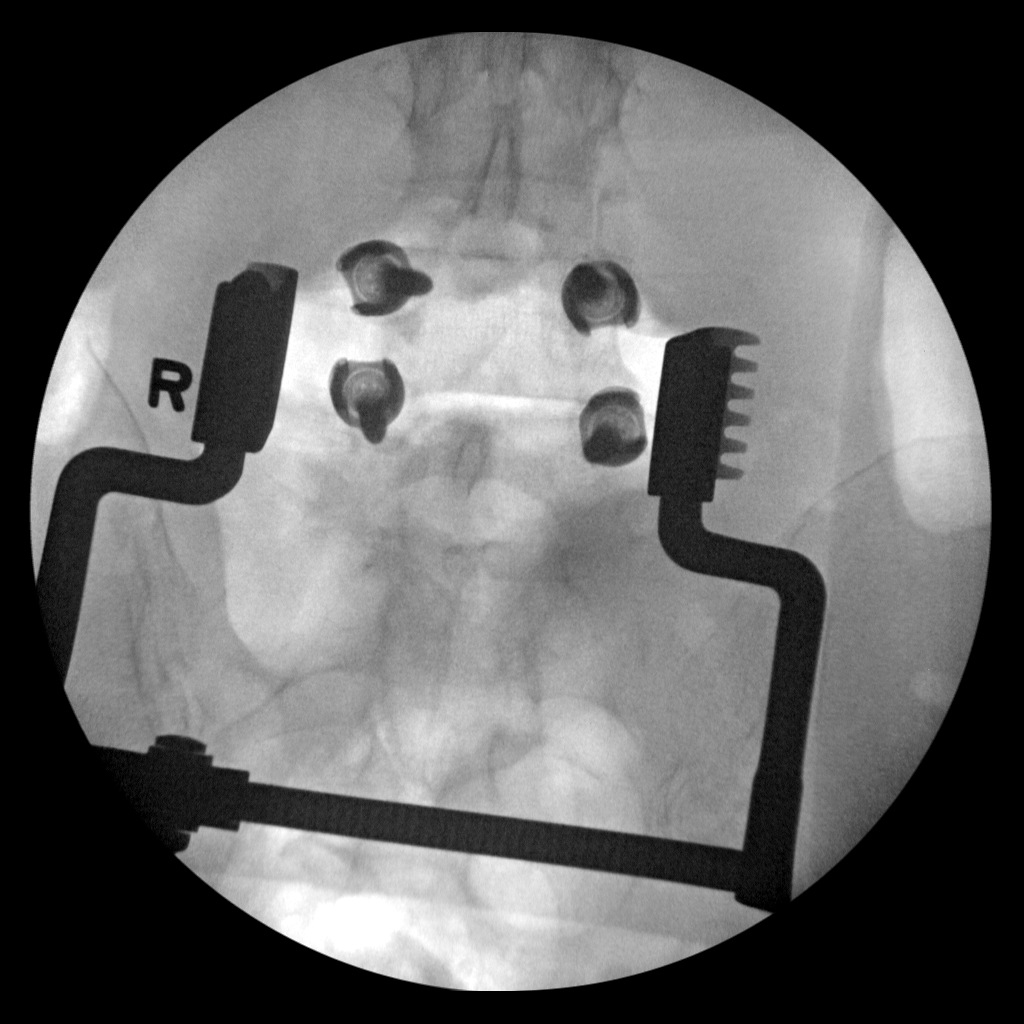
[im 2/2]
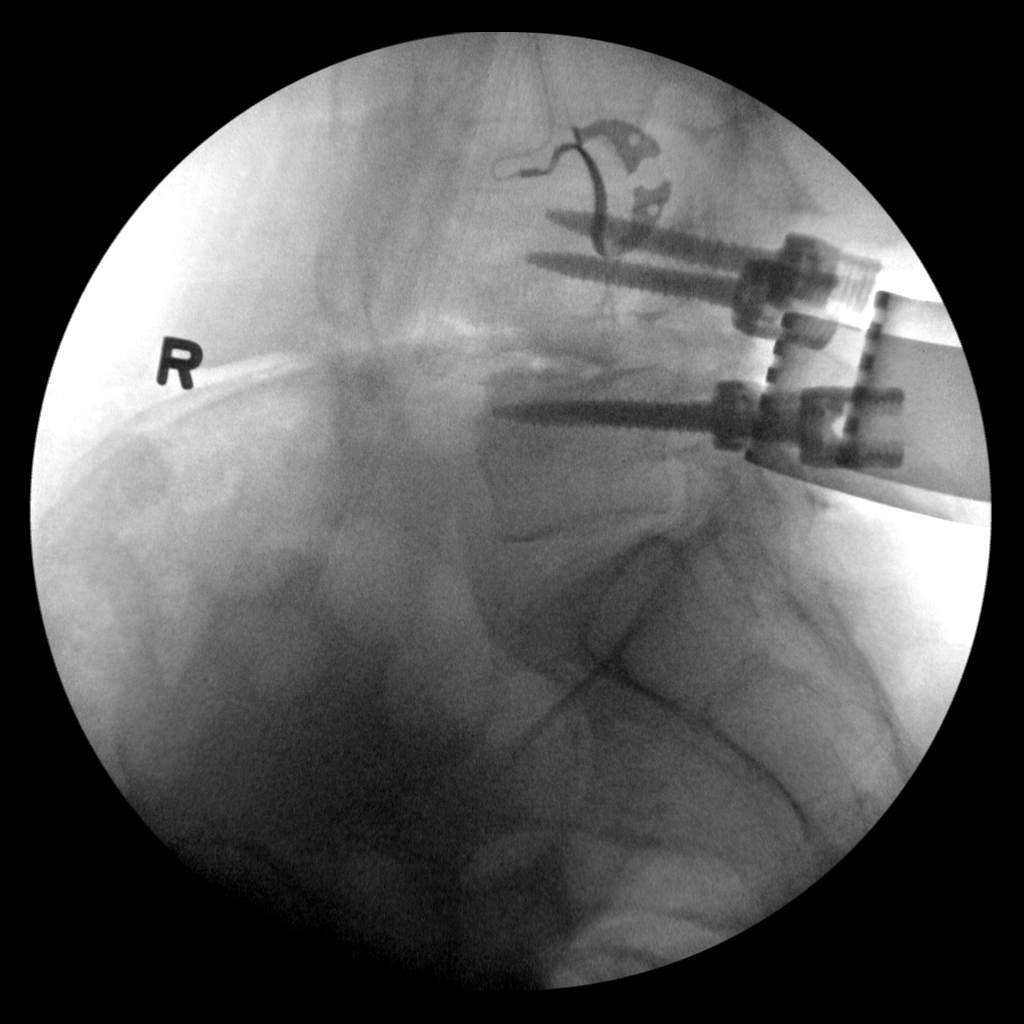

[2 of 2 positions shown; findings below may reference images not displayed]

FINDINGS: Examination demonstrates mild spondylosis of the lumbosacral spine
with disc space narrowing at the L4-5 level. Approximate grade 2
anterolisthesis of L4 on L5 unchanged. Posterior fusion hardware
with bile pedicle screws at the L4-5 level intact. Recommend
correlation with findings at the time of the procedure.
IMPRESSION: Posterior fusion hardware at the L4-5 level intact.

## 2016-12-15 IMAGING — CR DG LUMBAR SPINE 1V
1 series · 1 of 1 positions shown · non-contrast
Comparison: 08/22/2015 .

CLINICAL DATA: Lumbar fusion.

EXAM:
LUMBAR SPINE - 1 VIEW

[lat]
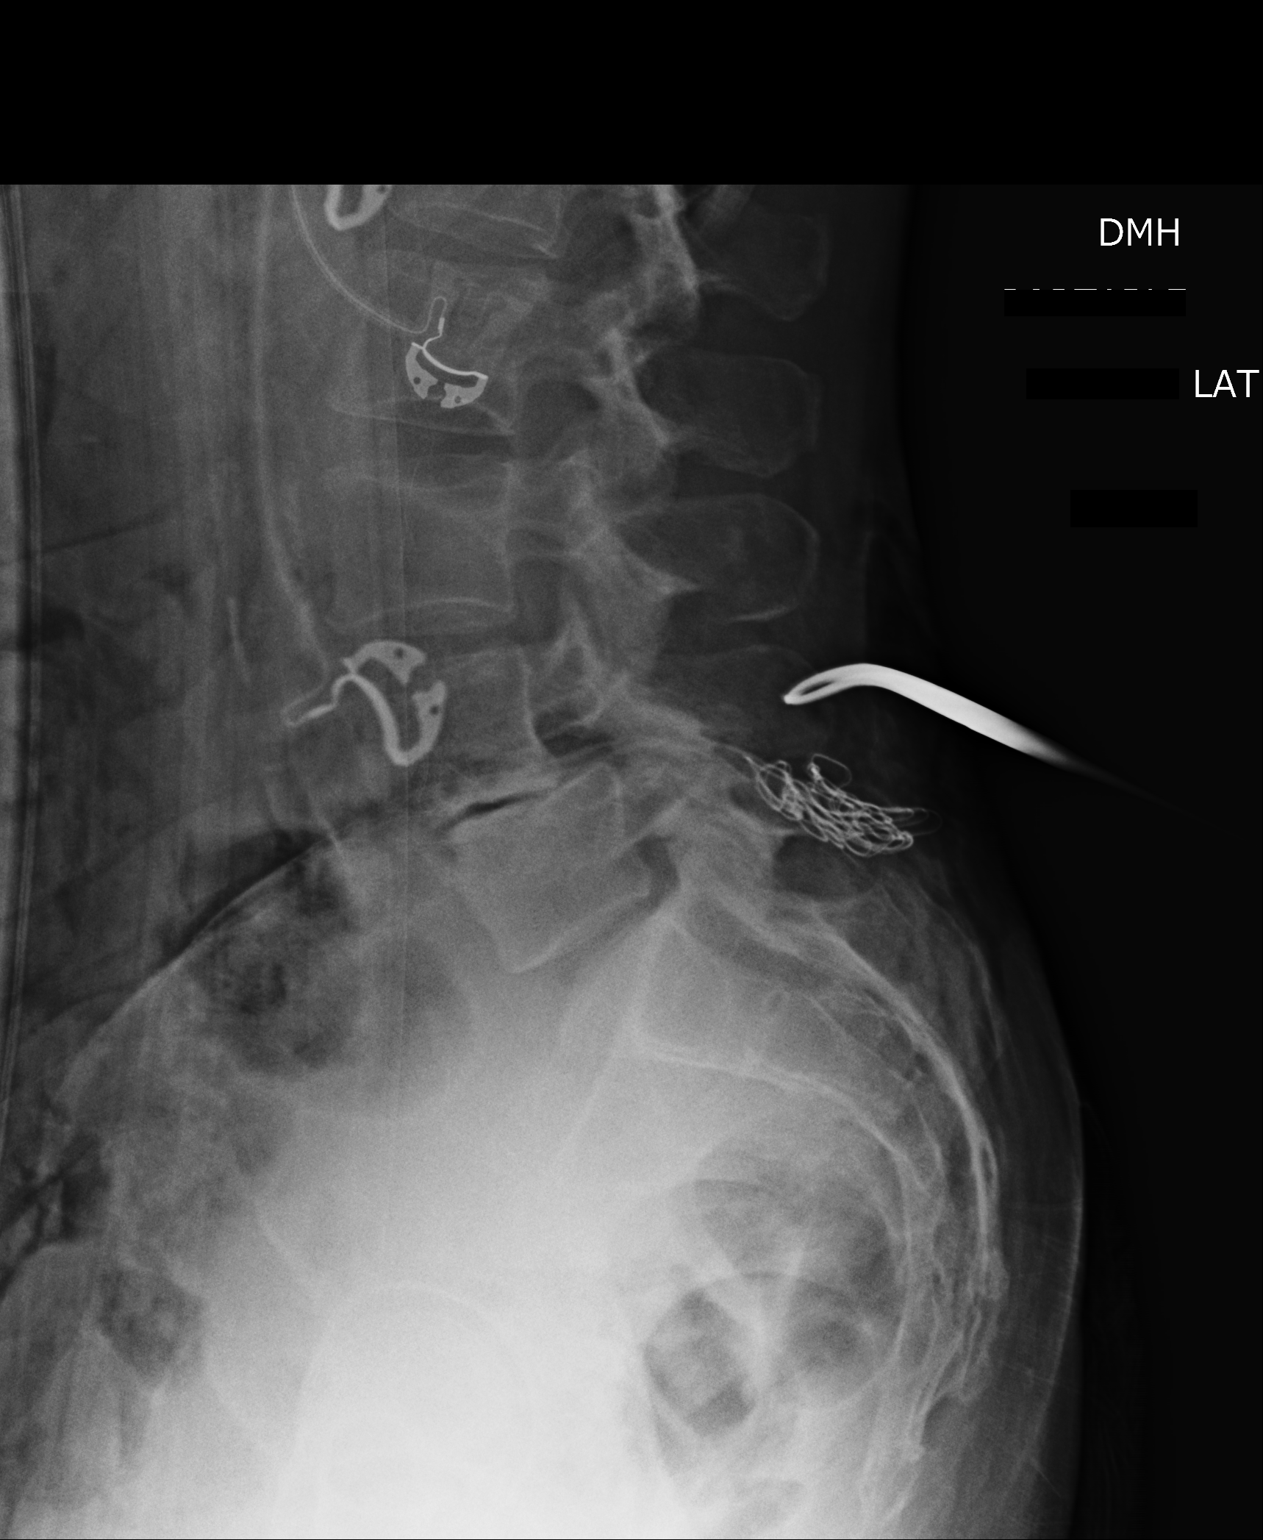

[1 of 1 positions shown; findings below may reference images not displayed]

FINDINGS: Metallic marker noted posteriorly at the L4-L5 level. Surgical
sponge noted. 1.3 cm anterolisthesis L4 on L5. Diffuse multilevel
degenerative change. No acute bony abnormality.
IMPRESSION: Metallic marker noted posteriorly at the L4-L5 level. Surgical
sponge noted. 1.3 cm L4-L5 anterolisthesis. Similar findings noted
on prior exam. Multilevel degenerative change. No acute bony
abnormality.

## 2016-12-16 ENCOUNTER — Ambulatory Visit (INDEPENDENT_AMBULATORY_CARE_PROVIDER_SITE_OTHER): Payer: Medicaid Other | Admitting: Family Medicine

## 2016-12-16 ENCOUNTER — Encounter: Payer: Self-pay | Admitting: Family Medicine

## 2016-12-16 ENCOUNTER — Ambulatory Visit
Admission: RE | Admit: 2016-12-16 | Discharge: 2016-12-16 | Disposition: A | Discharge status: Home / Self Care | Payer: Medicaid Other | Source: Ambulatory Visit | Attending: Family Medicine | Admitting: Family Medicine

## 2016-12-16 ENCOUNTER — Ambulatory Visit: Payer: Medicaid Other | Admitting: Family Medicine

## 2016-12-16 VITALS — BP 100/86 | HR 82 | Temp 98.8°F | Resp 16 | Ht 60.0 in | Wt 137.6 lb

## 2016-12-16 DIAGNOSIS — L989 Disorder of the skin and subcutaneous tissue, unspecified: Secondary | ICD-10-CM | POA: Diagnosis not present

## 2016-12-16 DIAGNOSIS — M47894 Other spondylosis, thoracic region: Secondary | ICD-10-CM | POA: Insufficient documentation

## 2016-12-16 DIAGNOSIS — G8929 Other chronic pain: Secondary | ICD-10-CM | POA: Diagnosis not present

## 2016-12-16 DIAGNOSIS — M4805 Spinal stenosis, thoracolumbar region: Secondary | ICD-10-CM

## 2016-12-16 DIAGNOSIS — M4316 Spondylolisthesis, lumbar region: Secondary | ICD-10-CM | POA: Diagnosis not present

## 2016-12-16 DIAGNOSIS — M546 Pain in thoracic spine: Principal | ICD-10-CM

## 2016-12-16 DIAGNOSIS — E034 Atrophy of thyroid (acquired): Secondary | ICD-10-CM | POA: Diagnosis not present

## 2016-12-16 DIAGNOSIS — Z808 Family history of malignant neoplasm of other organs or systems: Secondary | ICD-10-CM

## 2016-12-16 DIAGNOSIS — J432 Centrilobular emphysema: Secondary | ICD-10-CM | POA: Diagnosis not present

## 2016-12-16 DIAGNOSIS — Z9889 Other specified postprocedural states: Secondary | ICD-10-CM | POA: Insufficient documentation

## 2016-12-16 DIAGNOSIS — L821 Other seborrheic keratosis: Secondary | ICD-10-CM

## 2016-12-16 DIAGNOSIS — R590 Localized enlarged lymph nodes: Secondary | ICD-10-CM

## 2016-12-16 NOTE — Progress Notes (Signed)
Subjective:    Patient ID: Jamie Schultz, female    DOB: February 07, 1961, 56 y.o.   MRN: [id number]  Jamie Schultz is a 56 y.o. female presenting on 12/16/2016 for Adenopathy (hernia and skin cancer on legs )   HPI   Right LAD / Hypothyroidism - New complaint with Right sided anterior neck lymphadenopathy, also tender to touch at times, seems to be intermittent inc size and then improve over past >3 months by report, no clear etiology such as URI or sinus illness to trigger it, she has had some symptoms but these have improved. One spot is in area of prior anterior neck surgical incision for her C-spine surgery. - She is concerned about lymph nodes and wants to get imaging - Additionally has history of hypothyroidism, asking about thyromegaly  COPD with chronic tobacco abuse - Today doing well respiratory status but asking about rx or sample of new inhaler such as Anoro, she used to get samples of inhaler from prior PCP, had allergic reaction to [facility]. - Currently on Dulera, has Albuterol inhaler and nebulizer PRN, previously followed by Dr Wallene Huh at [facility] (approx 2016) but by chart review unable to return to their clinic due to financial limitations. She has not seen Pulmonology since. - Prior Spirometry 2008 with normal results, do not have any more recent PFTs available - No supplemental oxygen - Last Exacerbation 07/2016  CHRONIC BACK PAIN - Complex prior history of multiple back surgeries, she has known osteoarthritis, DJD, and spinal stenosis in C through L spine. S/p multiple level cervical spine surgery discectomy and decompression by Neurosurgery Dr Joya Salm (09/2015), also has had lumbar discectomy 02/2016. - Today still complaints of a lot of back pain and concerns that she may have spinal stenosis or narrowing in her mid back She states "they did not x-ray my mid back", and asking about updating x-rays today. She states her Neurosurgeon Dr Joya Salm has retired,  and she will eventually need to find a new one, but would like to get x-rays today first - Admits some stable symptoms from before with radicular pain - Also previously followed by Neurology, [facility] - Denies any fevers/chills, numbness, tingling, weakness, loss of control bladder/bowel incontinence or retention, unintentional wt loss, night sweats  Skin lesions - Reports new concern with several pigmented skin lesions, mostly Right lower extremity, mostly flat but slightly raised brown spots. She is concerned about potential skin cancer, has maternal great grandmother with melanoma. - She is unsure how long spots have been there, she thinks they are changing, no other significant spots - Denies any non healing ulceration, redness, swelling, bleeding  Social History  Substance Use Topics  . Smoking status: Current Some Day Smoker    Packs/day: 1.50    Years: 15.00    Types: Cigarettes    Last attempt to quit: 01/13/2016  . Smokeless tobacco: Current User  . Alcohol use No    Review of Systems Per HPI unless specifically indicated above     Objective:    BP 100/86   Pulse 82   Temp 98.8 F (37.1 C) (Oral)   Resp 16   Ht 5' (1.524 m)   Wt 137 lb 9.6 oz (62.4 kg)   BMI 26.87 kg/m   Wt Readings from Last 3 Encounters:  12/16/16 137 lb 9.6 oz (62.4 kg)  10/07/16 148 lb (67.1 kg)  09/29/16 135 lb (61.2 kg)    Physical Exam  Constitutional: She appears  well-developed and well-nourished. No distress.  Chronically ill-appearing, mildly uncomfortable due to back, cooperative  HENT:  Head: Normocephalic and atraumatic.  Mouth/Throat: Oropharynx is clear and moist.  Eyes: Conjunctivae are normal.  Neck: Normal range of motion. Neck supple. No thyromegaly present.  Old well healed Right sided neck anterior incisional scar s/p cervical spine (anterior approach) surgery  Palpable < 0.5 cm suspected lymphadenopathy non tender mobile Right submandibular  Palpable  increased hypertonicity of R SCM muscle belly, possible mild tender mid anterior cervical LAD < 0.5 cm difficult to appreciate due to tenderness and muscle spasm  Cardiovascular: Normal rate, regular rhythm, normal heart sounds and intact distal pulses.   No murmur heard. Pulmonary/Chest: Effort normal. No respiratory distress. She has no wheezes. She has no rales.  Mildly reduced air movement. No overt exp wheezing and no focal crackles. Speaks full sentences.  Musculoskeletal: She exhibits no edema.  Low Back Inspection: Normal appearance, thin body habitus, some mild spinal deformity s/p prior surgery Palpation: No tenderness over spinous processes. With bilateral thoracic and lumbar paraspinal muscles mild hypertonicity/spasm. ROM: Mostly full active ROM some stiffness in back from prior surgery Strength: Bilateral hip flex/ext 5/5, knee flex/ext 5/5, ankle dorsiflex/plantarflex 5/5 Neurovascular: intact distal sensation to light touch  Lymphadenopathy:    She has cervical adenopathy.  Neurological: She is alert.  Skin: Skin is warm and dry. No rash noted. She is not diaphoretic. No erythema.  Neck normal without rash or erythema.  Lower extremities scattered brown pigmented papules with stuck on appearance not consistent with nevi or abnormal atypical mole, seems more consistent with seborrheic keratoses, non inflamed, one lesion was cut with razor healing.  Psychiatric: She has a normal mood and affect. Her behavior is normal.  Nursing note and vitals reviewed.    Right medial knee    Right lower leg     Results for orders placed or performed during the hospital encounter of 09/29/16  CBC  Result Value Ref Range   WBC 9.8 4.0 - 10.5 K/uL   RBC 3.76 (L) 3.87 - 5.11 MIL/uL   Hemoglobin 11.1 (L) 12.0 - 15.0 g/dL   HCT 34.4 (L) 36.0 - 46.0 %   MCV 91.5 78.0 - 100.0 fL   MCH 29.5 26.0 - 34.0 pg   MCHC 32.3 30.0 - 36.0 g/dL   RDW 16.0 (H) 11.5 - 15.5 %   Platelets 242 150 -  400 K/uL  Basic metabolic panel  Result Value Ref Range   Sodium 134 (L) 135 - 145 mmol/L   Potassium 3.9 3.5 - 5.1 mmol/L   Chloride 102 101 - 111 mmol/L   CO2 21 (L) 22 - 32 mmol/L   Glucose, Bld 124 (H) 65 - 99 mg/dL   BUN 8 6 - 20 mg/dL   Creatinine, Ser 0.93 0.44 - 1.00 mg/dL   Calcium 8.2 (L) 8.9 - 10.3 mg/dL   GFR calc non Af Amer >60 >60 mL/min   GFR calc Af Amer >60 >60 mL/min   Anion gap 11 5 - 15  Glucose, capillary  Result Value Ref Range   Glucose-Capillary 114 (H) 65 - 99 mg/dL  Glucose, capillary  Result Value Ref Range   Glucose-Capillary 169 (H) 65 - 99 mg/dL  Glucose, capillary  Result Value Ref Range   Glucose-Capillary 139 (H) 65 - 99 mg/dL      Assessment & Plan:   Problem List Items Addressed This Visit    Spondylolisthesis of lumbar region  Stable on repeat Lumbar X-ray today No change in treatment plan, continue pain management Will need to find new Neurosurgery, advised she can see other provider at [facility] practice, she wants to hold off on this for now and avoid surgery if she can      Spinal stenosis of thoracolumbar region   Relevant Orders   DG Thoracic Spine W/Swimmers (Completed)   DG Lumbar Spine Complete (Completed)   Skin lesions    Clinically consistent with seborrheic keratoses, however given patients history and concern, agree with second opinion Dermatology - Referral sent to [facility]      Relevant Orders   Ambulatory referral to Dermatology   LAD (lymphadenopathy) of right cervical region - Primary    Suspected isolated subacute on chronic R cervical / submandibular LAD x 2-3 months (unclear timeline) and some intermittent recurrence. No obvious head/neck/sinus condition to cause reactive lymph nodes. On exam not as impressed with palpable LAD, suspect also may be related to prior R anterior cervical surgical approach, may be scar related, and seems SCM spasm is causing her symptoms.  Plan: 1. Advised may still  be benign, can monitor or since >3 months proceed with imaging, patient request imaging, ordered Neck Soft tissue US, to be scheduled [facility] and patient notified, if needed can proceed with FNA biopsy, will follow      Relevant Orders   US Soft Tissue Head/Neck   Hypothyroidism    Prior TSH stable, last 02/2016. Clinically not consistent with thyromegaly or goiter today. 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 - Continues on [place]: 1. Encourage remain smoke free, but long history of smoking and significant 2nd hand smoke at home 2. Advised we do not carry samples of inhalers for COPD, offered copay discount for Trelegy 3. Instead agree on referral to [facility] for further COPD management, will likely need repeat PFTs and other assistance with inhalers that we cannot provide      Relevant Orders   Ambulatory referral to Pulmonology   Chronic back pain    Clinically stable without significant worsening. See overview/history of prior spine problems, s/p surgeries No longer actively followed by Neurosurgery, reported Dr Joya Salm retirement  Plan: 1. Check X-rays today Thoracic / Lumbar - reviewed results no acute abnormality, stable with known DJD, spondylolisthesis Lumbar unchanged, stable post op - staff to notify patient of results 2. No change to treatment plan 3. Continue Pain Management, [facility] 4. Will need to find new Neurosurgery, advised she can see other provider at [facility] practice, she wants to hold off on this for now and avoid surgery if she can      Relevant Orders   DG Thoracic Spine W/Swimmers (Completed)   DG Lumbar Spine Complete (Completed)    Other Visit Diagnoses    Seborrheic keratoses       Relevant Orders     Ambulatory referral to Dermatology      No orders of the defined types were placed in this encounter.   H/o Allergic Reaction: - Additionally checked status of EpiPen rx today by patient request, it was problem through pharmacy ordering correct pen, PA was already completed, no new rx  ordered, patient can pick up EpiPen tomorrow according to her pharmacy   Follow up plan: Return in about 6 weeks (around 01/27/2017) for Neck lymph node.  Nobie Putnam, [facility] 12/17/2016, 8:17 AM

## 2016-12-16 NOTE — Patient Instructions (Signed)
Thank you for coming in to clinic today.  1. Referral to Dermatology  [facility], [place] [address] Phone: [phone]  2. Referral to Pulmonology for COPD  [facility] - Dr Raul Del  They will be able to get you appropriate inhaler as needed  3. Will order future Neck Ultrasound to check lymph nodes  4. X-rays today, stay tuned for results, I will be out of office the end of this week, if no significant abnormality will be notified next week with result.  You will be due for FASTING BLOOD WORK (no food or drink after midnight before, only water or coffee without cream/sugar on the morning of)  - Please go ahead and schedule a "Lab Only" visit in the morning at the clinic for lab draw in   Please schedule a follow-up appointment with Dr. Parks Ranger in 6 weeks for Neck Lymph Node Swelling  If you have any other questions or concerns, please feel free to call the clinic or send a message through [place]. You may also schedule an earlier appointment if necessary.  Nobie Putnam, DO [place]

## 2016-12-17 NOTE — Assessment & Plan Note (Signed)
Clinically stable without significant worsening. See overview/history of prior spine problems, s/p surgeries No longer actively followed by Neurosurgery, reported Dr Joya Salm retirement  Plan: 1. Check X-rays today Thoracic / Lumbar - reviewed results no acute abnormality, stable with known DJD, spondylolisthesis Lumbar unchanged, stable post op - staff to notify patient of results 2. No change to treatment plan 3. Continue Pain Management, [facility] 4. Will need to find new Neurosurgery, advised she can see other provider at Dr [facility] practice, she wants to hold off on this for now and avoid surgery if she can

## 2016-12-17 NOTE — Assessment & Plan Note (Signed)
Stable without acute COPD exac today. - Continues on [place]: 1. Encourage remain smoke free, but long history of smoking and significant 2nd hand smoke at home 2. Advised we do not carry samples of inhalers for COPD, offered copay discount for Trelegy 3. Instead agree on referral to [facility] for further COPD management, will likely need repeat PFTs and other assistance with inhalers that we cannot provide

## 2016-12-17 NOTE — Assessment & Plan Note (Signed)
Prior TSH stable, last 02/2016. Clinically not consistent with thyromegaly or goiter today. Seems more LAD - Monitor TSH - Check neck US for LAD and can eval thyroid

## 2016-12-17 NOTE — Assessment & Plan Note (Signed)
Suspected isolated subacute on chronic R cervical / submandibular LAD x 2-3 months (unclear timeline) and some intermittent recurrence. No obvious head/neck/sinus condition to cause reactive lymph nodes. On exam not as impressed with palpable LAD, suspect also may be related to prior R anterior cervical surgical approach, may be scar related, and seems SCM spasm is causing her symptoms.  Plan: 1. Advised may still be benign, can monitor or since >3 months proceed with imaging, patient request imaging, ordered Neck Soft tissue US, to be scheduled [facility] and patient notified, if needed can proceed with FNA biopsy, will follow

## 2016-12-17 NOTE — Assessment & Plan Note (Signed)
Clinically consistent with seborrheic keratoses, however given patients history and concern, agree with second opinion Dermatology - Referral sent to [facility]

## 2016-12-17 NOTE — Assessment & Plan Note (Signed)
Stable on repeat Lumbar X-ray today No change in treatment plan, continue pain management Will need to find new Neurosurgery, advised she can see other provider at Dr [facility] practice, she wants to hold off on this for now and avoid surgery if she can

## 2016-12-18 ENCOUNTER — Ambulatory Visit: Payer: Medicaid Other | Admitting: Nurse Practitioner

## 2016-12-22 ENCOUNTER — Ambulatory Visit: Admission: RE | Admit: 2016-12-22 | Payer: Medicaid Other | Source: Ambulatory Visit

## 2017-01-01 ENCOUNTER — Other Ambulatory Visit: Payer: Medicaid Other

## 2017-01-04 ENCOUNTER — Other Ambulatory Visit: Payer: Medicaid Other

## 2017-01-08 ENCOUNTER — Other Ambulatory Visit: Payer: Self-pay | Admitting: Family Medicine

## 2017-01-08 LAB — LIPID PANEL
CHOLESTEROL: 227 mg/dL — AB (ref <200)
HDL: 37 mg/dL — ABNORMAL LOW (ref >50)
LDL Cholesterol: 163 mg/dL — ABNORMAL HIGH (ref <100)
TRIGLYCERIDES: 133 mg/dL (ref <150)
Total CHOL/HDL Ratio: 6.1 Ratio — ABNORMAL HIGH (ref <5.0)
VLDL: 27 mg/dL (ref <30)

## 2017-01-09 LAB — HEMOGLOBIN A1C
Hgb A1c MFr Bld: 5.6 % (ref <5.7)
MEAN PLASMA GLUCOSE: 114 mg/dL

## 2017-01-09 LAB — VITAMIN D 25 HYDROXY (VIT D DEFICIENCY, FRACTURES): VIT D 25 HYDROXY: 12 ng/mL — AB (ref 30–100)

## 2017-01-11 NOTE — Progress Notes (Signed)
The pt was notified, no questions or concerns. 

## 2017-01-19 ENCOUNTER — Ambulatory Visit (INDEPENDENT_AMBULATORY_CARE_PROVIDER_SITE_OTHER): Payer: Medicaid Other | Admitting: Internal Medicine

## 2017-01-19 ENCOUNTER — Encounter: Payer: Self-pay | Admitting: Internal Medicine

## 2017-01-19 VITALS — BP 124/84 | HR 103 | Resp 6 | Ht 60.0 in | Wt 135.0 lb

## 2017-01-19 DIAGNOSIS — R0600 Dyspnea, unspecified: Secondary | ICD-10-CM

## 2017-01-19 DIAGNOSIS — J449 Chronic obstructive pulmonary disease, unspecified: Secondary | ICD-10-CM

## 2017-01-19 MED ORDER — TIOTROPIUM BROMIDE-OLODATEROL 2.5-2.5 MCG/ACT IN AERS: 2.0000 | INHALATION_SPRAY | Freq: Every day | RESPIRATORY_TRACT | 0 refills | Status: DC

## 2017-01-19 MED ORDER — TIOTROPIUM BROMIDE-OLODATEROL 2.5-2.5 MCG/ACT IN AERS: 2.0000 | INHALATION_SPRAY | Freq: Every day | RESPIRATORY_TRACT | 5 refills | Status: DC

## 2017-01-19 NOTE — Progress Notes (Signed)
Name: Jamie Schultz MRN: [id number] DOB: Mar 04, 1961   CONSULTATION DATE: 01/19/2017   REFERRING MD : Robyne Askew  CHIEF COMPLAINT: SOB  HISTORY OF PRESENT ILLNESS:   56 yo white female with SOB, chronic in nature Dx with COPD 3-4 years ago +assocaited with intermittent wheezing and coughing spells Still smokes 1 ppd Chronic smoker 30 pack year Wants to quit May 30Th On disability for unknown reason since 1995 Has tried BREo and does NOT help Has tried nasocort and has helped Uses albuterol as needed and that seems to help  Office SPiro-shows no obvious obstruction but clinical history suggests COPD  H/o intubation and renal failure several years ago 8 day ICU stay  No sign of lower ext edema, no signs of infection at this time   PAST MEDICAL HISTORY :   has a past medical history of Abscess; ADHD; Allergy; Anxiety; Arthritis; Chronic back pain; Congenital prolapsed rectum; COPD (chronic obstructive pulmonary disease) ([place]); Fibromyalgia; History of kidney stones; Kidney failure; MRSA (methicillin resistant staph aureus) culture positive; Muscle spasm; Muscle spasms of both lower extremities; Numbness; Panic attack; and Wears dentures.  has a past surgical history that includes Neck surgery; Abdominal hysterectomy (1999); Hernia repair (2000); Knee surgery (right 2008); Carpal tunnel release; Shoulder surgery; Incision and drainage (Left); Joint replacement (Right, 2010); Colonoscopy with propofol (N/A, 05/30/2015); polypectomy (05/30/2015); Anterior cervical decomp/discectomy fusion (N/A, 09/10/2015); Back surgery; and Total shoulder arthroplasty (Left, 09/29/2016). Prior to Admission medications   Medication Sig Start Date End Date Taking? Authorizing Provider  albuterol (PROVENTIL HFA;VENTOLIN HFA) 108 (90 Base) MCG/ACT inhaler Inhale 2 puffs into the lungs every 6 (six) hours as needed for wheezing or shortness of breath. 06/05/16   Krebs, Genevie Cheshire, NP  ALPRAZolam Duanne Moron) 1 MG  tablet Take 1 mg by mouth 3 (three) times daily.    [provider]  amphetamine-dextroamphetamine (ADDERALL) 15 MG tablet Take 1 tablet by mouth 2 (two) times daily. 02/26/16   [provider]  BELSOMRA 20 MG TABS TAKE ONE TABLET BY MOUTH ONCE NIGHTLY as needed for insomnia 03/31/16   [provider]  EPIPEN 2-PAK 0.3 MG/0.3ML SOAJ injection USE AS DIRECTED FOR ANAPYLACTIC REACTION (TO BEE STINGS) 04/30/16   Krebs, Amy Lauren, NP  hydrOXYzine (ATARAX/VISTARIL) 25 MG tablet TAKE 1 TABLET (25 MG TOTAL) BY MOUTH EVERY 8 (EIGHT) HOURS AS NEEDED. Patient taking differently: Take 25 mg by mouth every 8 (eight) hours as needed for itching.  06/16/16   Krebs, Genevie Cheshire, NP  ipratropium-albuterol (DUONEB) 0.5-2.5 (3) MG/3ML SOLN Take 3 mLs by nebulization every 6 (six) hours. Patient taking differently: Take 3 mLs by nebulization every 6 (six) hours as needed (shortness of breath).  03/03/16   Wieting, Richard, MD  LYRICA 100 MG capsule Take 1 capsule (100 mg total) by mouth 3 (three) times daily. 12/03/16   Karamalegos, Devonne Doughty, DO  meloxicam (MOBIC) 15 MG tablet Take 1 tablet by mouth daily as needed. 07/27/16   [provider]  mometasone (NASONEX) 50 MCG/ACT nasal spray PLACE 2 SPRAYS EACH NOSTRIL EVERY DAY Patient taking differently: Place 2 sprays into the nose daily as needed (allergies).  06/05/16   Krebs, Genevie Cheshire, NP  mometasone-formoterol (DULERA) 200-5 MCG/ACT AERO Inhale 2 puffs into the lungs 2 (two) times daily. 04/24/16   Krebs, Amy Lauren, NP  MOVANTIK 25 MG TABS tablet Take 25 mg by mouth daily. 11/03/16   [provider]  Multiple Vitamin (MULITIVITAMIN WITH MINERALS) TABS Take 1 tablet by  mouth daily.    [provider]  Oxycodone HCl 10 MG TABS Take 1.5-3 tablets (15-30 mg total) by mouth 4 (four) times daily as needed (severe pain). 09/30/16   Marchia Bond, MD  polyethylene glycol powder (GLYCOLAX/MIRALAX) powder Take 1 Container by  mouth See admin instructions. 09/23/16   [provider]  promethazine (PHENERGAN) 25 MG tablet Take 1 tablet (25 mg total) by mouth 2 (two) times daily as needed for nausea or vomiting. 12/01/16   Karamalegos, Devonne Doughty, DO  QUEtiapine (SEROQUEL) 400 MG tablet Takes 800mg  by mouth at bedtime. 01/14/16   [provider]  sennosides-docusate sodium (SENOKOT-S) 8.6-50 MG tablet Take 2 tablets by mouth daily. 09/29/16   Marchia Bond, MD  tiZANidine (ZANAFLEX) 4 MG tablet Take 0.5-1 tablets (2-4 mg total) by mouth every 8 (eight) hours as needed for muscle spasms. 07/27/16   Olin Hauser, DO   Allergies  Allergen Reactions  . Bee Venom Anaphylaxis    Bees/wasps/yellow jackets  . Keflex [Cephalexin] Anaphylaxis  . Penicillins Anaphylaxis    Has patient had a PCN reaction causing immediate rash, facial/tongue/throat swelling, SOB or lightheadedness with hypotension: Yes Has patient had a PCN reaction causing severe rash involving mucus membranes or skin necrosis: No Has patient had a PCN reaction that required hospitalization Yes, in the hospital already Has patient had a PCN reaction occurring within the last 10 years: Yes If all of the above answers are "NO", then may proceed with Cephalosporin use.   . Sulfa Antibiotics Other (See Comments)    Renal failure  . Hydrocodone Rash and Other (See Comments)    "I couldn't get my breath"  . Ibuprofen Other (See Comments)    Vomiting blood  . Codeine Itching and Rash  . Cyclobenzaprine Other (See Comments)    Severe constipation  . Fentanyl Nausea And Vomiting    Severe vomiting  . Methadone Other (See Comments)    Change in mental status  . Neurontin [Gabapentin] Hives  . Tape Itching and Rash    Blisters skin, Please use "paper" tape  . Tegretol [Carbamazepine] Hives and Rash  . Toradol [Ketorolac Tromethamine] Hives and Rash  . Tramadol Hives and Rash  . Tylenol [Acetaminophen] Rash    Vomiting blood     FAMILY HISTORY:  family history includes Asthma in her mother; Cancer in her mother; Heart disease in her mother; Stroke in her father and mother. SOCIAL HISTORY:  reports that she has been smoking Cigarettes.  She has a 22.50 pack-year smoking history. She uses smokeless tobacco. She reports that she does not drink alcohol or use drugs.  REVIEW OF SYSTEMS:   Constitutional: Negative for fever, chills, weight loss, malaise/fatigue and diaphoresis.  HENT: Negative for hearing loss, ear pain, nosebleeds, congestion, sore throat, neck pain, tinnitus and ear discharge.   Eyes: Negative for blurred vision, double vision, photophobia, pain, discharge and redness.  Respiratory: Negative for cough, hemoptysis, sputum production, shortness of breath, wheezing and stridor.   Cardiovascular: Negative for chest pain, palpitations, orthopnea, claudication, leg swelling and PND.  Gastrointestinal: Negative for heartburn, nausea, vomiting, abdominal pain, diarrhea, constipation, blood in stool and melena.  Genitourinary: Negative for dysuria, urgency, frequency, hematuria and flank pain.  Musculoskeletal: Negative for myalgias, back pain, joint pain and falls.  Skin: Negative for itching and rash.  Neurological: Negative for dizziness, tingling, tremors, sensory change, speech change, focal weakness, seizures, loss of consciousness, weakness and headaches.  Endo/Heme/Allergies: Negative for environmental allergies and polydipsia.  Does not bruise/bleed easily. ALL OTHER ROS ARE NEGATIVE   BP 124/84 (BP Location: Right Arm, Patient Position: Sitting, Cuff Size: Normal)   Pulse (!) 103   Resp (!) 6   Ht 5' (1.524 m)   Wt 135 lb (61.2 kg)   SpO2 97%   BMI 26.37 kg/m    Physical Examination:   GENERAL:NAD, no fevers, chills, no weakness no fatigue HEAD: Normocephalic, atraumatic.  EYES: Pupils equal, round, reactive to light. Extraocular muscles intact. No scleral icterus.  MOUTH: Moist mucosal  membrane.   EAR, NOSE, THROAT: Clear without exudates. No external lesions.  NECK: Supple. No thyromegaly. No nodules. No JVD.  PULMONARY:CTA B/L no wheezes, no crackles, no rhonchi CARDIOVASCULAR: S1 and S2. Regular rate and rhythm. No murmurs, rubs, or gallops. No edema.  GASTROINTESTINAL: Soft, nontender, nondistended. No masses. Positive bowel sounds.  MUSCULOSKELETAL: No swelling, clubbing, or edema. Range of motion full in all extremities.  NEUROLOGIC: Cranial nerves II through XII are intact. No gross focal neurological deficits.  SKIN: No ulceration, lesions, rashes, or cyanosis. Skin warm and dry. Turgor intact.  PSYCHIATRIC: Mood, affect within normal limits. The patient is awake, alert and oriented x 3. Insight, judgment intact.    I have Independently reviewed images of  CXR o11/2017  on 01/19/2017 Interpretation:no opacities, no effusions noted, no active disease seen    ASSESSMENT / PLAN: 56 yo white female with ongoing tobacco use with SOB and wheezing findings to suggest COPD  1.SOB and Wheezing-likely from COPD -will use albuterol as needed  2.COPD-Mild Gold stage A Will start STIOLTO(long acting  AC/LABA)  3.Smoking cessation strongly advises She has set quit date of May 30th  4.no need for steroids or bronc at this time  Patient/Family are satisfied with Plan of action and management. All questions answered  Corrin Parker, M.D.  [facility]  Medical Director [place] Director [facility] Cardio-Pulmonary Department

## 2017-01-19 NOTE — Addendum Note (Signed)
Addended by: Devona Konig on: 01/19/2017 10:50 AM   Modules accepted: Orders

## 2017-01-19 NOTE — Patient Instructions (Signed)
Start Stiolto Albuterol as needed(Proair) 2-4 puffs every 4-6 hrs as needed STOP SMOKING!!

## 2017-01-31 ENCOUNTER — Other Ambulatory Visit: Payer: Self-pay | Admitting: Family Medicine

## 2017-02-01 ENCOUNTER — Ambulatory Visit: Payer: Medicaid Other | Admitting: Family Medicine

## 2017-02-04 ENCOUNTER — Encounter: Payer: Self-pay | Admitting: Nurse Practitioner

## 2017-02-04 ENCOUNTER — Ambulatory Visit (INDEPENDENT_AMBULATORY_CARE_PROVIDER_SITE_OTHER): Payer: Medicaid Other | Admitting: Nurse Practitioner

## 2017-02-04 VITALS — BP 94/52 | HR 91 | Temp 98.7°F | Ht 60.0 in | Wt 142.4 lb

## 2017-02-04 DIAGNOSIS — G8929 Other chronic pain: Secondary | ICD-10-CM | POA: Diagnosis not present

## 2017-02-04 DIAGNOSIS — M25531 Pain in right wrist: Secondary | ICD-10-CM | POA: Diagnosis not present

## 2017-02-04 DIAGNOSIS — M25532 Pain in left wrist: Secondary | ICD-10-CM

## 2017-02-04 NOTE — Progress Notes (Signed)
Subjective:    Patient ID: Jamie Schultz, female    DOB: 11/28/1960, 56 y.o.   MRN: [id number]  Jamie Schultz is a 56 y.o. female presenting on 02/04/2017 for Carpal Tunnel   HPI  Carpal Tunnel Pt presents today w/ c/o "carpal tunnel."  Pt states Dr. Sabra Heck did initial carpal tunnel surgery 21 years ago.  She has had recurrence of symptoms that started 3 years ago w/ progressive worsening.  Current symptoms include numbness of fingers of both hands, weakness of both hands, and shooting and burning pain with bending elbow and putting pressure on hand (affects R side only). She states she can hold a glass and then drop it without realizing it because she loses sensation in her hands.  Pertinent past surgical history: Prior cervical fusion ("12 screws and 6 discs") by Dr. Charlynn Court  Pt requests referral to Dr. Noemi Chapel at [facility] in [place].   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  She has had many orthopedic and spinal surgeries.  She is being seeing for pain by a pain clinic in [place], and she requests referral to new pain clinic b/c she would like a more local provider.    She received a certified letter discussing her discharge from this practice and she states she is very anxious.  Social History  Substance Use Topics  . Smoking status: Current Some Day Smoker    Packs/day: 1.50    Years: 15.00    Types: Cigarettes    Last attempt to quit: 01/13/2016  . Smokeless tobacco: Current User  . Alcohol use No    Review of Systems Per HPI unless specifically indicated above     Objective:    BP (!) 94/52 (BP Location: Right Arm, Patient Position: Sitting, Cuff Size: Normal)   Pulse 91   Temp 98.7 F (37.1 C) (Oral)   Ht 5' (1.524 m)   Wt 142 lb 6.4 oz (64.6 kg)   SpO2 94%   BMI 27.81 kg/m    Wt Readings from Last 3 Encounters:  02/04/17 142 lb 6.4 oz (64.6 kg)  01/19/17 135 lb (61.2 kg)  12/16/16 137 lb 9.6 oz (62.4 kg)    Physical Exam    Constitutional: She is oriented to person, place, and time. She appears well-developed and well-nourished.  Pulmonary/Chest: Effort normal. She has wheezes. She has rhonchi in the right middle field and the right lower field.  Musculoskeletal:  Left Hand/Wrist Inspection: Normal appearance, symmetrical, several bulky MCP joints, no edema or erythema. Palpation: Non tender hand / wrist, carpal bones, including MCP, base of thumb. No distinct anatomical snuff box or scaphoid tenderness. Pt cites numbness as reason for no tenderness ROM: minimally decreased AROM flexion, extension, ulnar & radial deviation Special Testing: Phalen test positive with severe pain. Negative Tinel's test. Strength: 4/5 grip, wrist flex/ext, normal pincer grasp Neurovascular: numbness of pip joints to tips of fingers  Right Hand/Wrist Inspection: Normal appearance, symmetrical, no bulky MCP joints, no edema or erythema. Palpation: Non tender hand / wrist, carpal bones, including MCP, base of thumb. No distinct anatomical snuff box or scaphoid tenderness.  ROM: minimally decreased AROM flexion, extension, ulnar & radial deviation Special Testing: Phalen test negative, Tinel's test negative. Strength: 4/5 grip, wrist flex/ext, normal pincer grasp of 1st & 2nd digits.  Weak pincer grasp of 1st & 5th digits. Neurovascular: numbness of pip joints to tips of fingers   Neurological: She is alert and oriented to person, place, and time.  Skin: Skin is warm and dry.  Psychiatric:  Anxious, tangential thoughts, tearful and is dressed with her shirt inside out.        Assessment & Plan:   Problem List Items Addressed This Visit    None    Visit Diagnoses    Bilateral wrist pain   -  Primary Pt with prior carpal tunnel surgery.  Significantly worsening pain of wrists with numbness of fingers and loss of strength of right hand and left 1st and 5th pincer grasp. Possible exacerbation of carpal tunnel, however w/  negative tinel test and phalen test of Left hand and only positive phalen test of right hand.  Plan: 1. Continue pain management with pain clinic. 2. Referral placed to [facility] per pt request.   Relevant Orders   Ambulatory referral to Orthopedic Surgery   Other chronic pain       Pt requests pain clinic in [place] to reduce driving to [place].  Pt with multiple orthopedic abnormalities and surgeries.  Chronic pain syndromes and on chronic opioid management.  Plan: 1. Proceed w/ new pain clinic referral.  Advised patient to continue to keep relationship w/ pain clinic in [place].  Discussed possibility that new provider may not be available or accept her as a new patient, but that we would try.   Relevant Orders   Ambulatory referral to Pain Clinic      No orders of the defined types were placed in this encounter.   Follow up plan: Return if symptoms worsen or fail to improve.  Cassell Smiles, DNP, AGPCNP-BC Adult Gerontology Primary Care Nurse Practitioner [facility] 02/04/2017, 5:52 PM   Would like to avoid the [facility] pain clinic or [facility]

## 2017-02-04 NOTE — Patient Instructions (Addendum)
Jamie Schultz, Thank you for coming in to clinic today.  1. For your arm pain, numbness, and shooting pain: - This may be an exacerbation of your carpal tunnel. - I have sent a referral to [facility].  Call us if you don't hear about an appointment from them in 1-2 weeks. - Continue your current pain medications.  2. We will try to get you into a new pain clinic closer to home. Keep your current pain clinic until you have confirmed your new appointments.  Please schedule a follow-up appointment with Cassell Smiles, AGNP to Return if symptoms worsen or fail to improve.  If you have any other questions or concerns, please feel free to call the clinic or send a message through [place]. You may also schedule an earlier appointment if necessary.  Cassell Smiles, DNP, AGNP-BC Adult Gerontology Nurse Practitioner [facility], [facility]   Carpal Tunnel Syndrome Carpal tunnel syndrome is a condition that causes pain in your hand and arm. The carpal tunnel is a narrow area located on the palm side of your wrist. Repeated wrist motion or certain diseases may cause swelling within the tunnel. This swelling pinches the main nerve in the wrist (median nerve). What are the causes? This condition may be caused by:  Repeated wrist motions.  Wrist injuries.  Arthritis.  A cyst or tumor in the carpal tunnel.  Fluid buildup during pregnancy.  Sometimes the cause of this condition is not known. What increases the risk? This condition is more likely to develop in:  People who have jobs that cause them to repeatedly move their wrists in the same motion, such as Art gallery manager.  Women.  People with certain conditions, such as: ? Diabetes. ? Obesity. ? An underactive thyroid (hypothyroidism). ? Kidney failure.  What are the signs or symptoms? Symptoms of this condition include:  A tingling feeling in your fingers, especially in your thumb, index, and middle fingers.  Tingling  or numbness in your hand.  An aching feeling in your entire arm, especially when your wrist and elbow are bent for long periods of time.  Wrist pain that goes up your arm to your shoulder.  Pain that goes down into your palm or fingers.  A weak feeling in your hands. You may have trouble grabbing and holding items.  Your symptoms may feel worse during the night. How is this diagnosed? This condition is diagnosed with a medical history and physical exam. You may also have tests, including:  An electromyogram (EMG). This test measures electrical signals sent by your nerves into the muscles.  X-rays.  How is this treated? Treatment for this condition includes:  Lifestyle changes. It is important to stop doing or modify the activity that caused your condition.  Physical or occupational therapy.  Medicines for pain and inflammation. This may include medicine that is injected into your wrist.  A wrist splint.  Surgery.  Follow these instructions at home: If you have a splint:  Wear it as told by your health care provider. Remove it only as told by your health care provider.  Loosen the splint if your fingers become numb and tingle, or if they turn cold and blue.  Keep the splint clean and dry. General instructions  Take over-the-counter and prescription medicines only as told by your health care provider.  Rest your wrist from any activity that may be causing your pain. If your condition is work related, talk to your employer about changes that can be  made, such as getting a wrist pad to use while typing.  If directed, apply ice to the painful area: ? Put ice in a plastic bag. ? Place a towel between your skin and the bag. ? Leave the ice on for 20 minutes, 2-3 times per day.  Keep all follow-up visits as told by your health care provider. This is important.  Do any exercises as told by your health care provider, physical therapist, or occupational therapist. Contact a  health care provider if:  You have new symptoms.  Your pain is not controlled with medicines.  Your symptoms get worse. This information is not intended to replace advice given to you by your health care provider. Make sure you discuss any questions you have with your health care provider. Document Released: 08/14/2000 Document Revised: 12/26/2015 Document Reviewed: 01/02/2015 Elsevier Interactive Patient Education  2017 [employer].

## 2017-02-05 NOTE — Progress Notes (Signed)
I have reviewed this encounter including the documentation in this note and/or discussed this patient with the provider, Cassell Smiles, AGPCNP-BC. I am certifying that I agree with the content of this note as supervising physician.  Nobie Putnam, [facility] 02/05/2017, 8:41 AM

## 2017-02-13 IMAGING — CR DG LUMBAR SPINE 2-3V
1 series · 2 of 2 positions shown · non-contrast
Comparison: Lumbar spine radiographs April 03, 2016

CLINICAL DATA: Low back pain after tripping over cat last night.

EXAM:
LUMBAR SPINE - 2-3 VIEW

[Series 1: dg lumbar spine 2-3 views · 0.14mm/px · 2 of 2 slices shown]
[im 1/2]
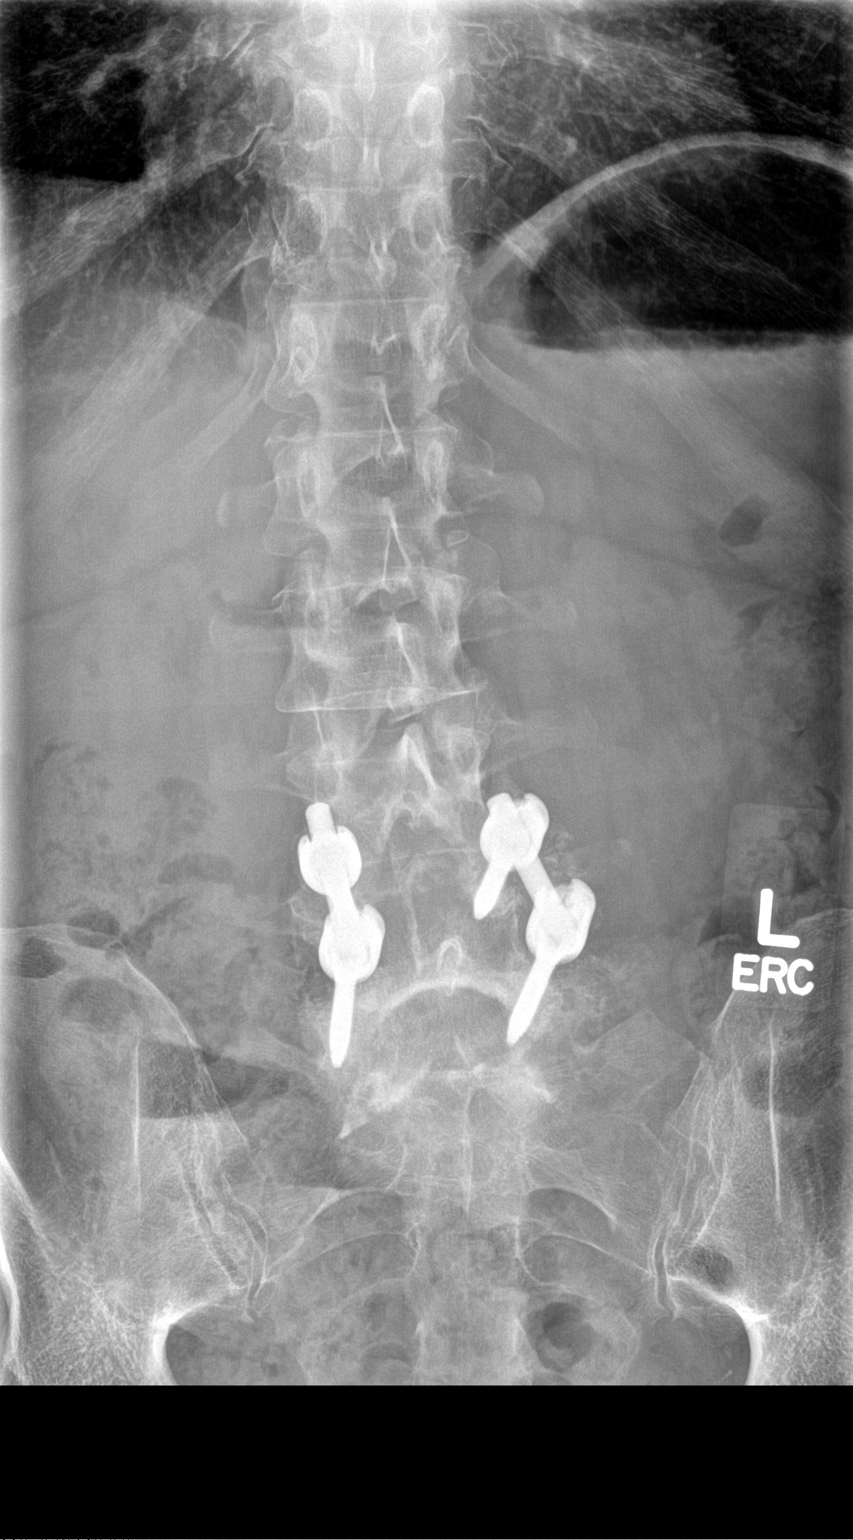
[im 2/2]
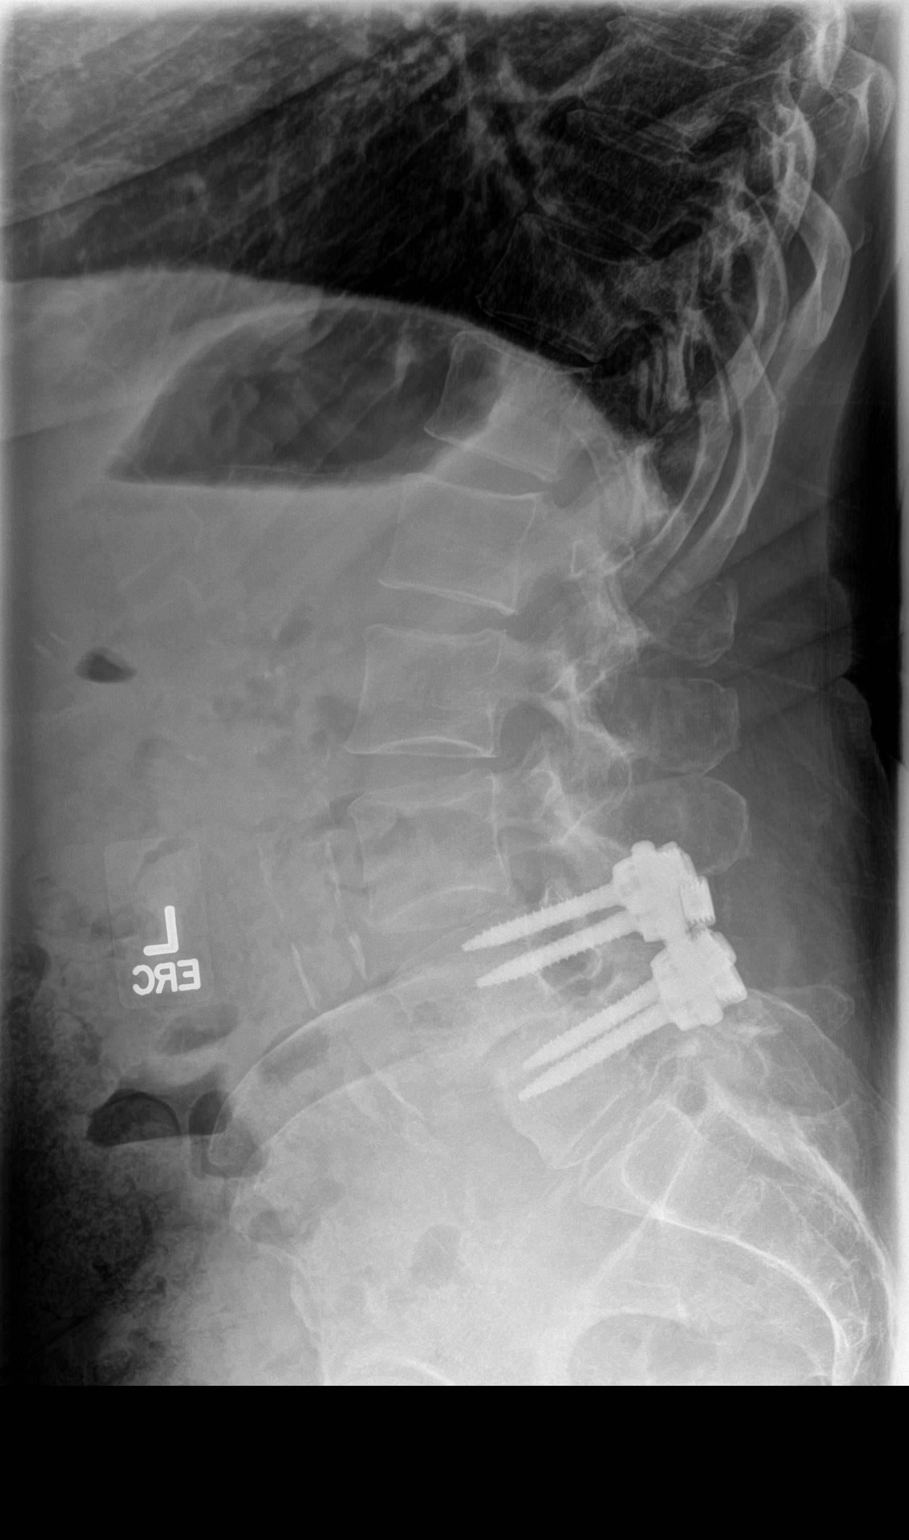

[2 of 2 positions shown; findings below may reference images not displayed]

FINDINGS: Lumbar vertebral bodies intact. Grade 1 L4-5 anterolisthesis, status
post L4-5 PLIF, unchanged from prior study. Hardware is intact
well-seated, no periprosthetic lucency. Status post L4-5
laminectomy. No destructive bony lesions. Severe L4-5 disc height
loss is similar. The remaining disc heights generally preserved.
Moderate vascular calcifications.
IMPRESSION: No acute fracture deformity. Status post L4-5 PLIF and laminectomy
with similar postoperative changes. Stable grade 1 L4-5
anterolisthesis.

## 2017-03-01 ENCOUNTER — Ambulatory Visit: Payer: Medicaid Other | Admitting: Nurse Practitioner

## 2017-03-15 ENCOUNTER — Ambulatory Visit: Payer: Medicaid Other | Admitting: Nurse Practitioner

## 2017-03-24 ENCOUNTER — Other Ambulatory Visit: Payer: Self-pay

## 2017-03-24 DIAGNOSIS — J432 Centrilobular emphysema: Secondary | ICD-10-CM

## 2017-03-24 MED ORDER — ALBUTEROL SULFATE HFA 108 (90 BASE) MCG/ACT IN AERS: 2.0000 | INHALATION_SPRAY | Freq: Four times a day (QID) | RESPIRATORY_TRACT | 5 refills | Status: DC | PRN

## 2017-04-19 IMAGING — DX DG CHEST 2V
2 series · 2 of 2 positions shown · non-contrast
Comparison: 03/02/2016

CLINICAL DATA: Asthma, cough, COPD

EXAM:
CHEST  2 VIEW

[chest pa]
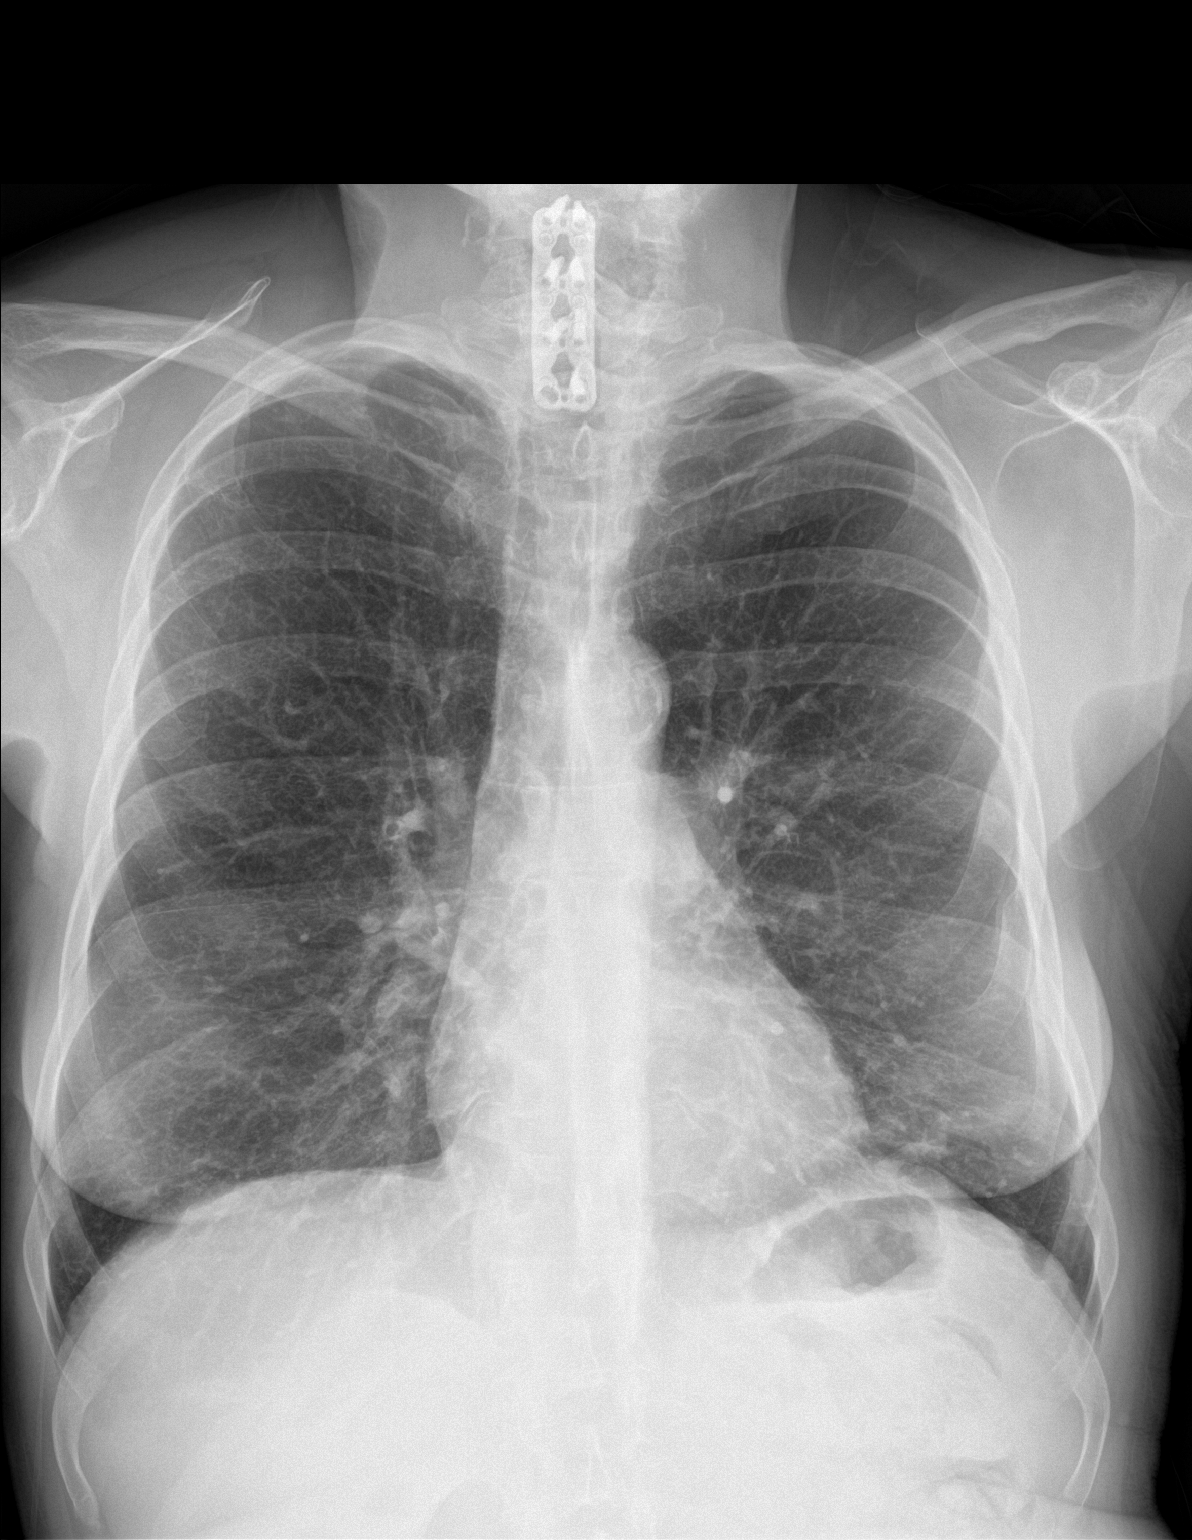

[chest lat]
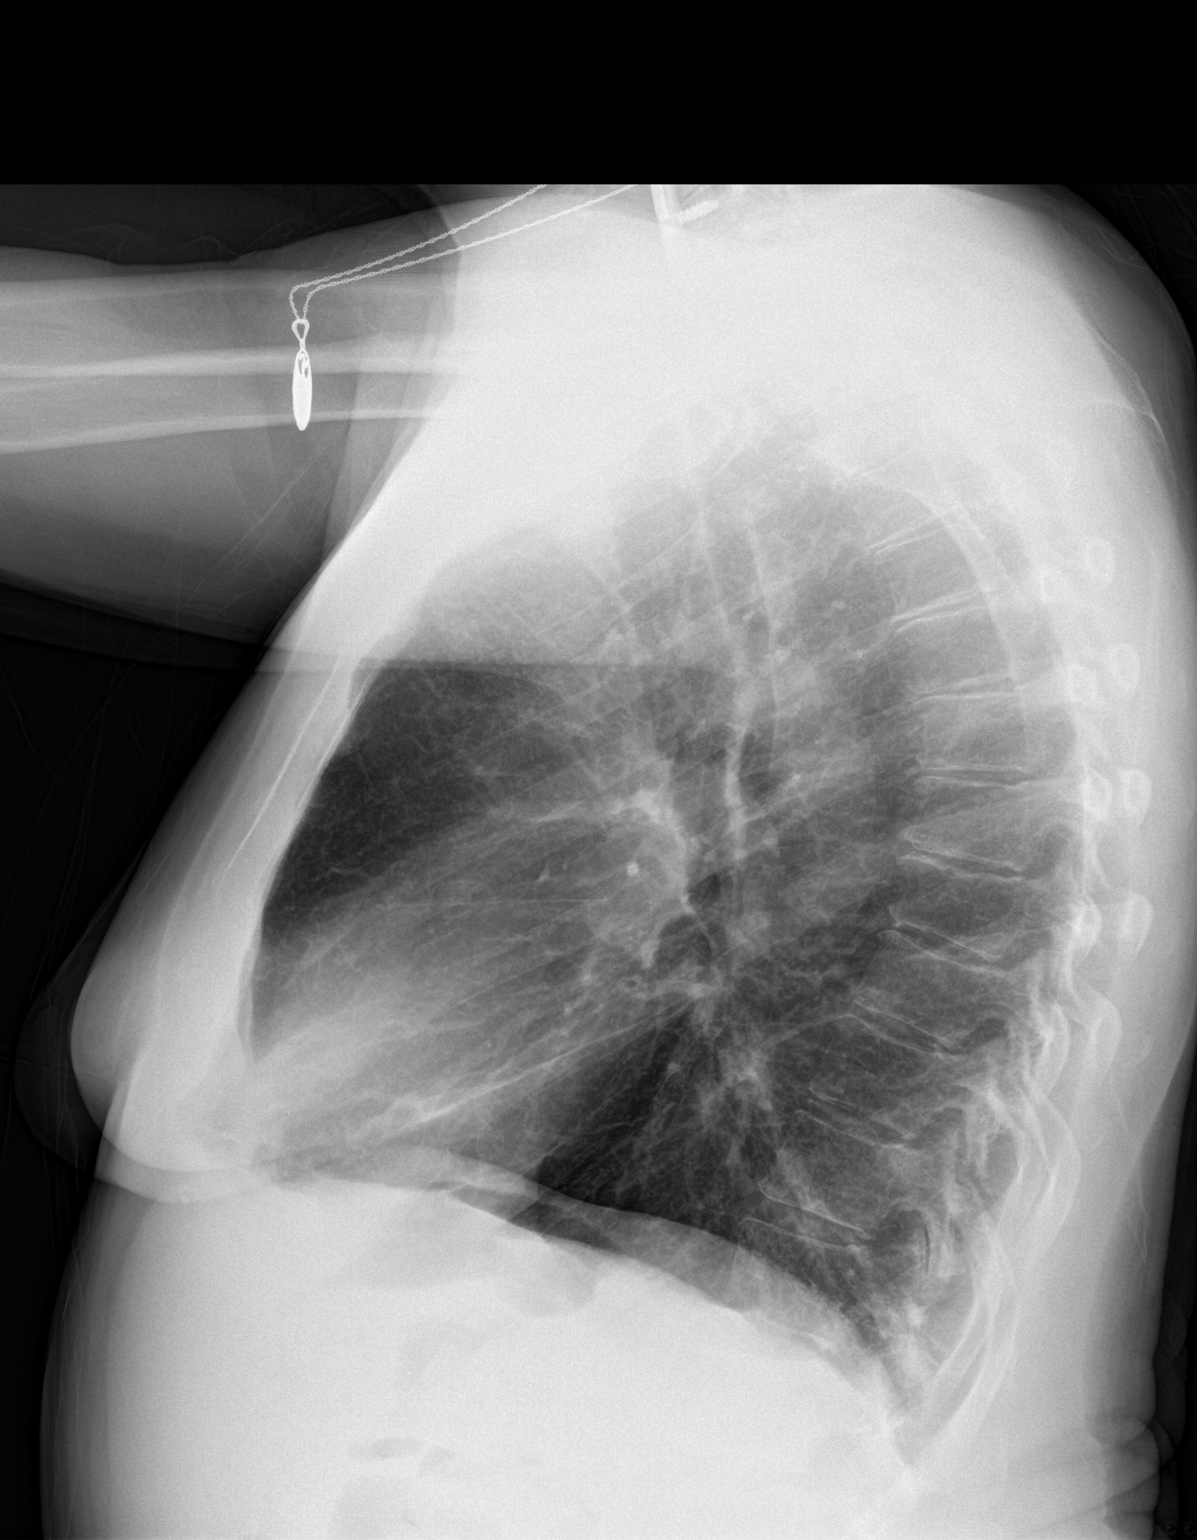

[2 of 2 positions shown; findings below may reference images not displayed]

FINDINGS: Cardiomediastinal silhouette is stable. No infiltrate or pleural
effusion. No pulmonary edema. Metallic fixation plate noted cervical
spine. Mild degenerative changes mid thoracic spine.
IMPRESSION: No active cardiopulmonary disease.

## 2017-05-17 ENCOUNTER — Other Ambulatory Visit: Payer: Self-pay | Admitting: Physician Assistant

## 2017-05-17 DIAGNOSIS — Z1231 Encounter for screening mammogram for malignant neoplasm of breast: Secondary | ICD-10-CM

## 2017-06-21 IMAGING — CT CT SHOULDER*L* W/O CM
5 of 7 series · 14 of 33 positions shown, 15 images · non-contrast
Comparison: None.

CLINICAL DATA: Left shoulder pain and decreased range of motion.

EXAM:
CT OF THE UPPER LEFT EXTREMITY WITHOUT CONTRAST
TECHNIQUE: Multidetector CT imaging of the upper left extremity was performed
according to the standard protocol.

[Series 2: ax bone · axial · 0.46mm/px · z∈[-221,-168]mm · 3 of 178 slices shown, 4 images]
[im 45/178  soft-tissue]
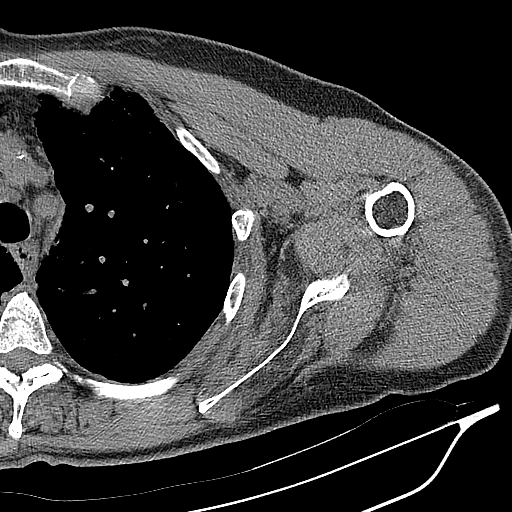
[im 45/178  bone]
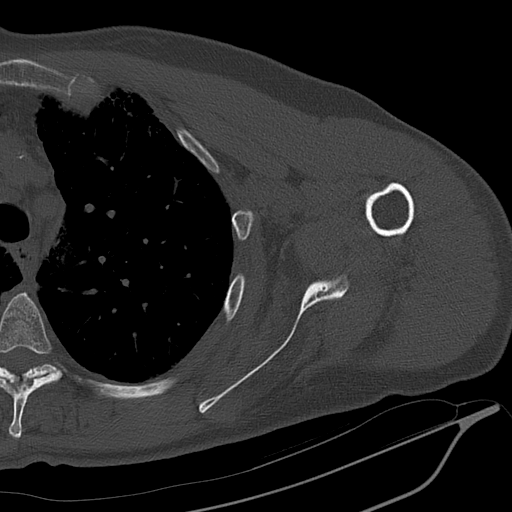
[im 89/178  bone]
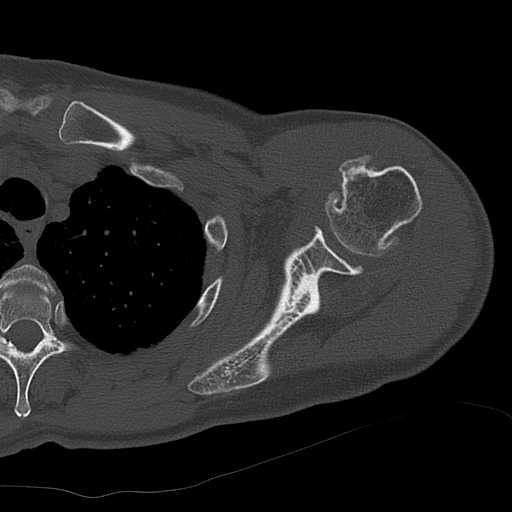
[im 133/178  bone]
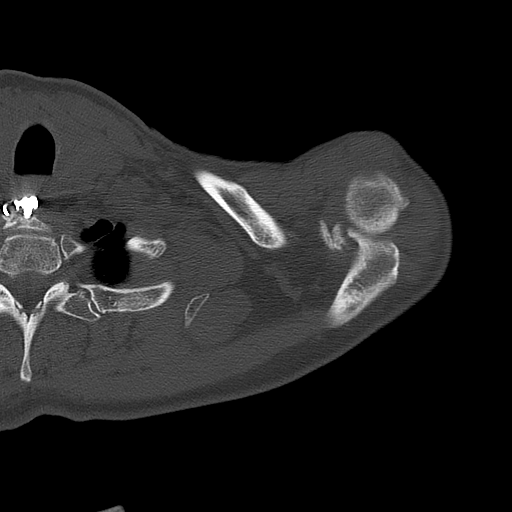

[Series 3: ax soft · axial · 0.46mm/px · z∈[-207,-174]mm · 2 of 164 slices shown]
[im 55/164  soft-tissue]
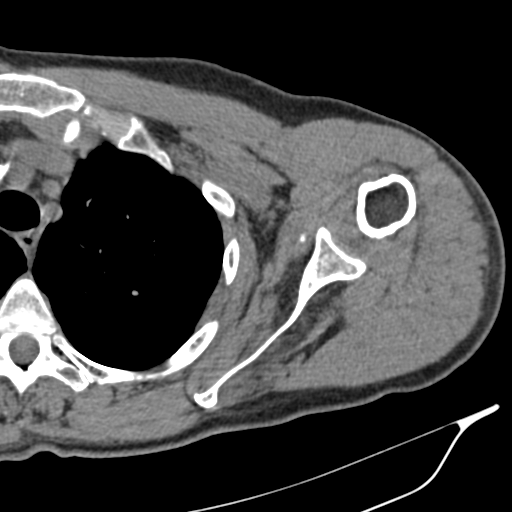
[im 109/164  soft-tissue]
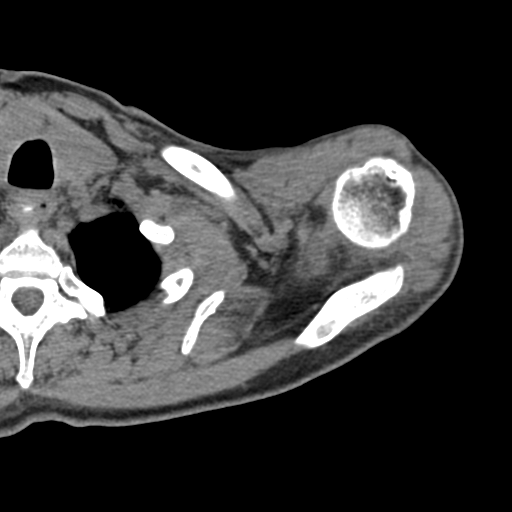

[Series 5: ax soft mpr · axial · 0.46mm/px · z∈[-215,-166]mm · 3 of 197 slices shown]
[im 50/197  soft-tissue]
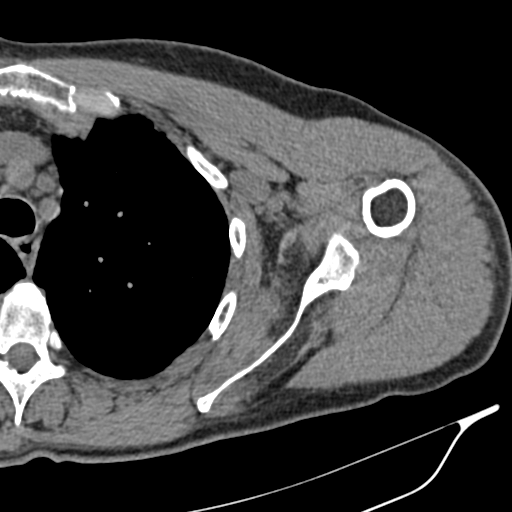
[im 99/197  soft-tissue]
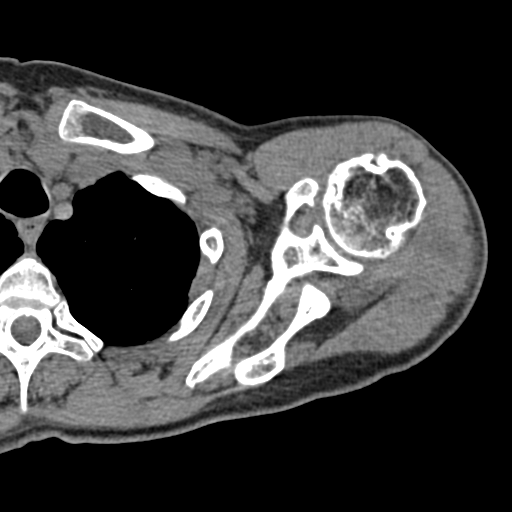
[im 148/197  soft-tissue]
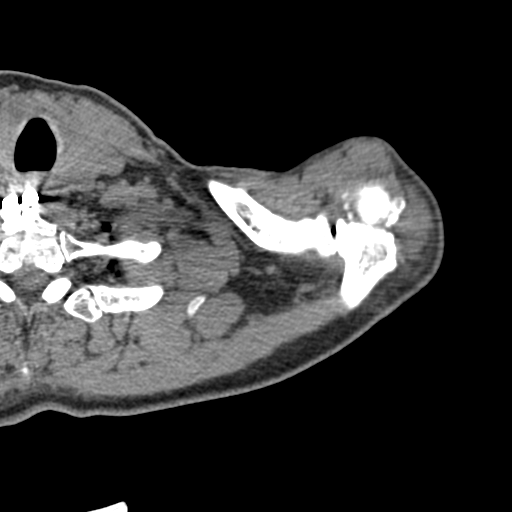

[Series 604: soft coronal · coronal · 0.46mm/px · 3 of 95 slices shown]
[im 19/95  bone]
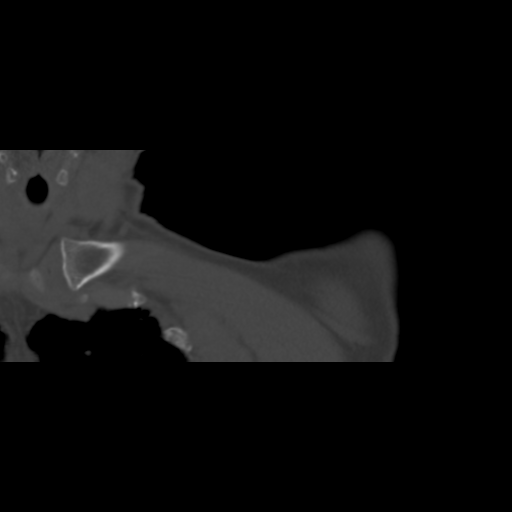
[im 38/95  bone]
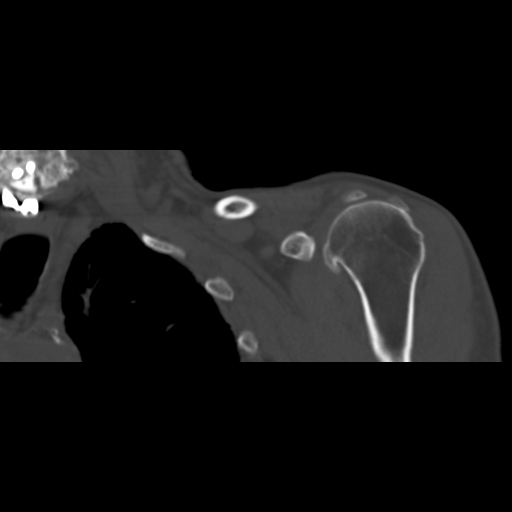
[im 57/95  bone]
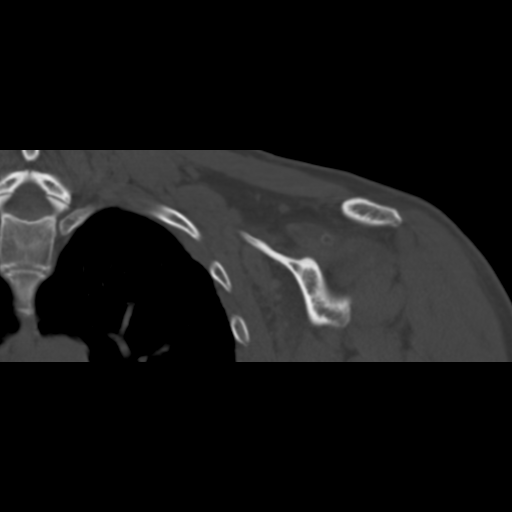

[Series 610: coronal soft · sagittal · 0.46mm/px · 3 of 110 slices shown]
[im 28/110  bone]
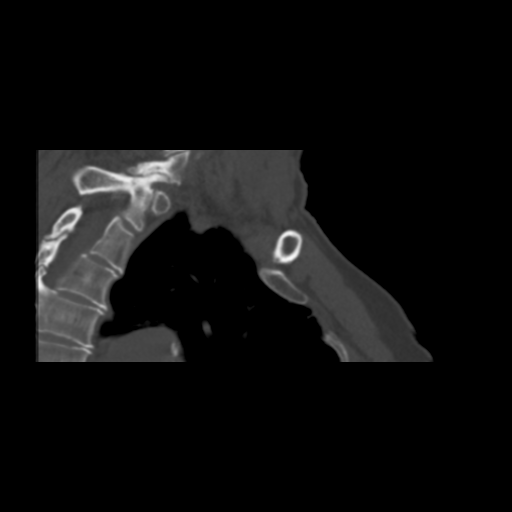
[im 55/110  bone]
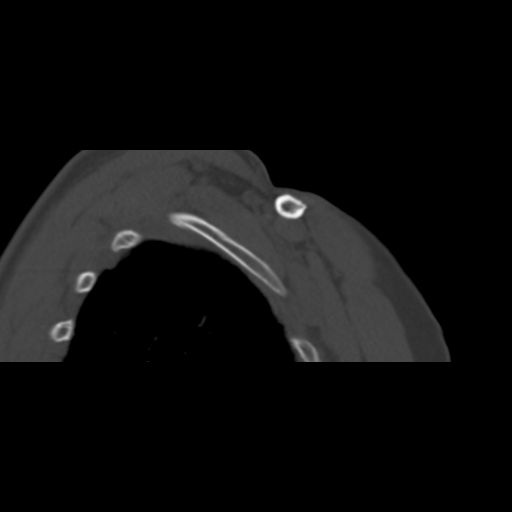
[im 82/110  bone]
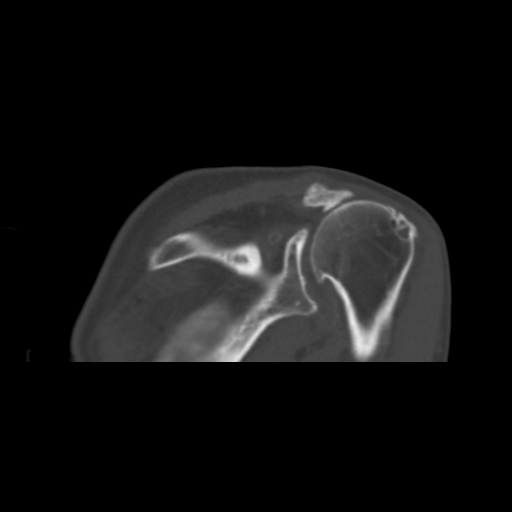

[14 of 33 positions shown; findings below may reference images not displayed]

FINDINGS: Bones/Joint/Cartilage

There is moderate AC joint arthropathy. Previous acromioplasty.
Moderately severe glenohumeral joint arthropathy with calcified
loose bodies in the joint. Superior subluxation of the humeral head
with respect to the glenoid. The humeral head articulates with the
undersurface of the acromial remnant. Type 3 acromion.

Muscles and Tendons

Chronic large full-thickness rotator cuff tear. Marked atrophy of
the infraspinatus, subscapularis, and supraspinous muscles.
IMPRESSION: 1. Moderately severe glenohumeral joint arthritis. Calcified loose
bodies in the joint.
2. Large chronic full-thickness rotator cuff tears with marked
atrophy of the infraspinatus, supraspinous and subscapularis
muscles.

## 2017-06-22 IMAGING — CR DG SHOULDER 1V*L*
1 series · 1 of 1 positions shown · non-contrast
Comparison: None.

CLINICAL DATA: Left shoulder arthroplasty.

EXAM:
LEFT SHOULDER - 1 VIEW

[ap]
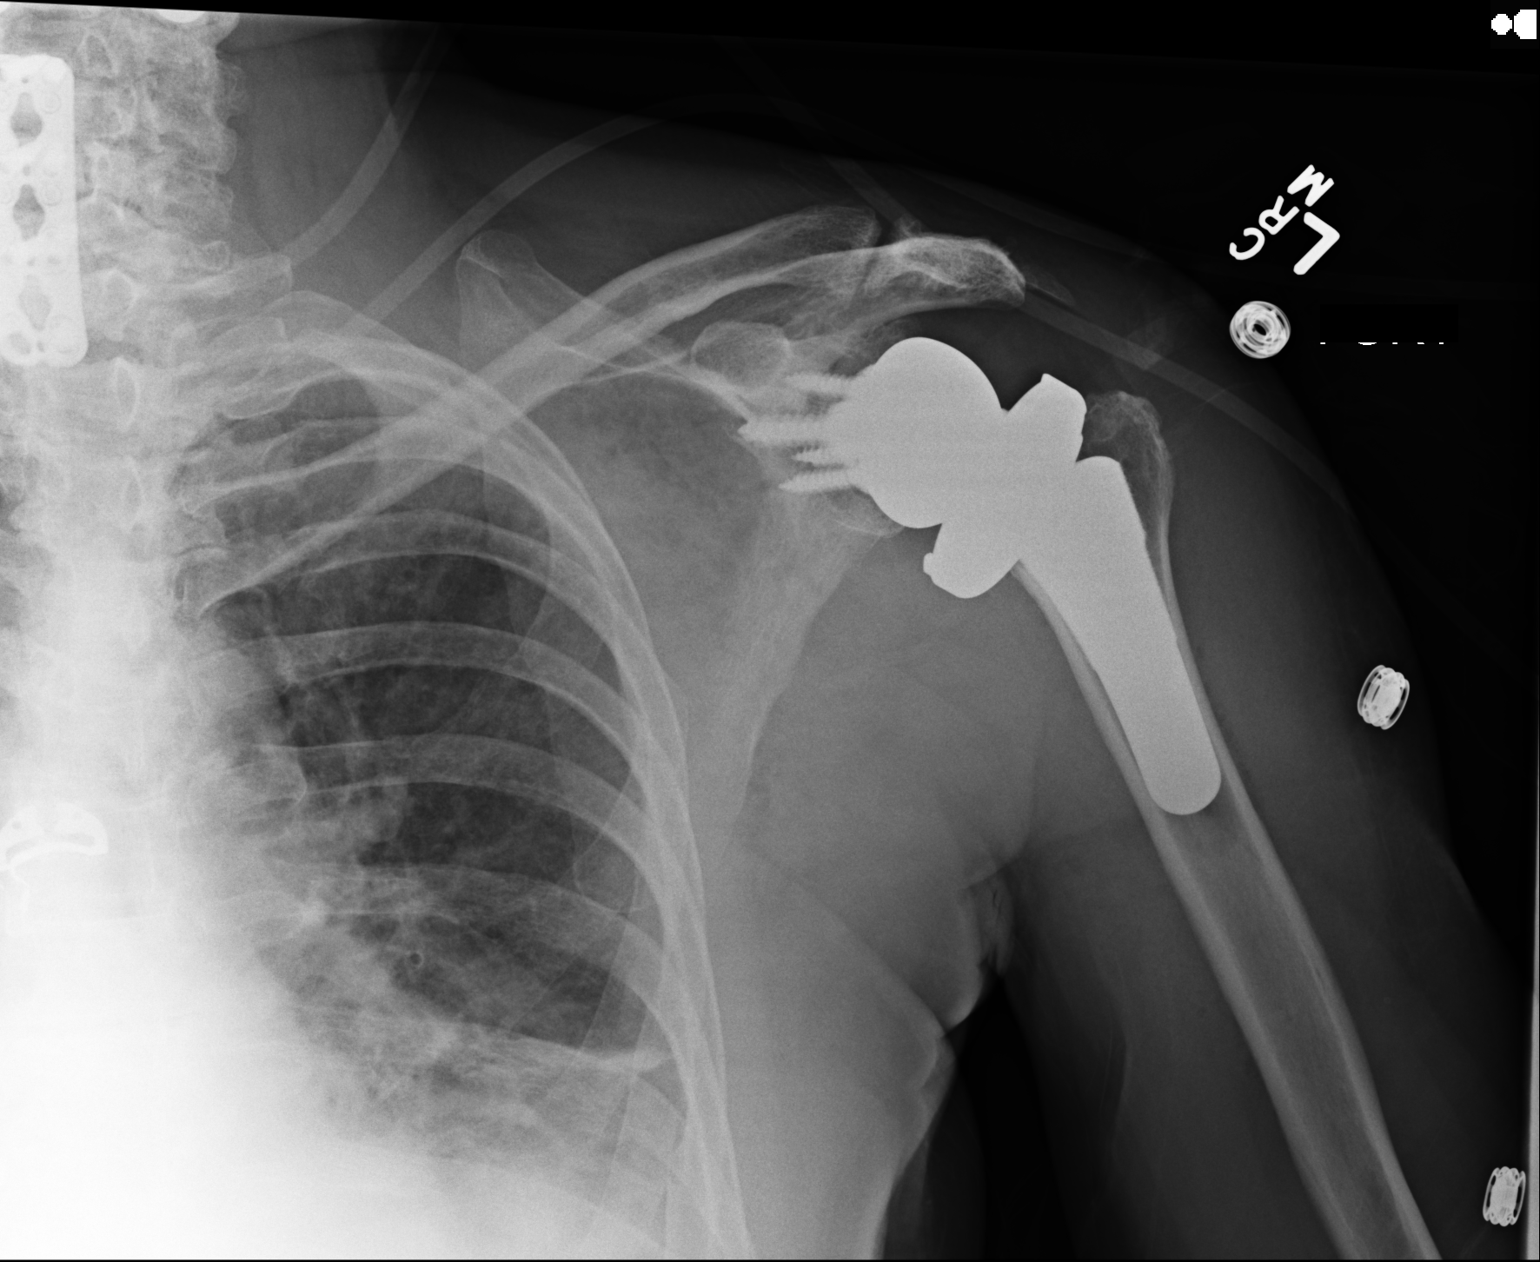

[1 of 1 positions shown; findings below may reference images not displayed]

FINDINGS: Postsurgical changes in the left shoulder. Patient status post
arthroplasty. Hardware intact. Anatomic alignment. Degenerative
changes left shoulder. Prior cervical spine fusion . ScratchedLeft
base subsegmental atelectasis.
IMPRESSION: Postsurgical changes left shoulder. Hardware intact. Anatomic
alignment.

## 2017-07-27 ENCOUNTER — Ambulatory Visit: Payer: Medicaid Other | Attending: Nurse Practitioner | Admitting: Nurse Practitioner

## 2017-07-27 ENCOUNTER — Encounter: Payer: Self-pay | Admitting: Nurse Practitioner

## 2017-07-27 ENCOUNTER — Other Ambulatory Visit: Payer: Self-pay

## 2017-07-27 VITALS — BP 160/107 | HR 96 | Temp 98.5°F | Resp 20 | Ht 60.0 in | Wt 135.0 lb

## 2017-07-27 DIAGNOSIS — M79605 Pain in left leg: Secondary | ICD-10-CM | POA: Diagnosis not present

## 2017-07-27 DIAGNOSIS — M5442 Lumbago with sciatica, left side: Secondary | ICD-10-CM | POA: Insufficient documentation

## 2017-07-27 DIAGNOSIS — M25512 Pain in left shoulder: Secondary | ICD-10-CM | POA: Diagnosis not present

## 2017-07-27 DIAGNOSIS — M542 Cervicalgia: Secondary | ICD-10-CM

## 2017-07-27 DIAGNOSIS — F909 Attention-deficit hyperactivity disorder, unspecified type: Secondary | ICD-10-CM | POA: Diagnosis not present

## 2017-07-27 DIAGNOSIS — M545 Low back pain: Secondary | ICD-10-CM

## 2017-07-27 DIAGNOSIS — G8929 Other chronic pain: Secondary | ICD-10-CM | POA: Insufficient documentation

## 2017-07-27 DIAGNOSIS — F419 Anxiety disorder, unspecified: Secondary | ICD-10-CM | POA: Diagnosis not present

## 2017-07-27 DIAGNOSIS — M4316 Spondylolisthesis, lumbar region: Secondary | ICD-10-CM | POA: Insufficient documentation

## 2017-07-27 DIAGNOSIS — Z96612 Presence of left artificial shoulder joint: Secondary | ICD-10-CM | POA: Diagnosis not present

## 2017-07-27 DIAGNOSIS — E039 Hypothyroidism, unspecified: Secondary | ICD-10-CM | POA: Insufficient documentation

## 2017-07-27 DIAGNOSIS — M79606 Pain in leg, unspecified: Secondary | ICD-10-CM

## 2017-07-27 DIAGNOSIS — G894 Chronic pain syndrome: Secondary | ICD-10-CM | POA: Diagnosis not present

## 2017-07-27 DIAGNOSIS — Z789 Other specified health status: Secondary | ICD-10-CM | POA: Diagnosis not present

## 2017-07-27 DIAGNOSIS — F17210 Nicotine dependence, cigarettes, uncomplicated: Secondary | ICD-10-CM | POA: Insufficient documentation

## 2017-07-27 DIAGNOSIS — M170 Bilateral primary osteoarthritis of knee: Secondary | ICD-10-CM | POA: Insufficient documentation

## 2017-07-27 DIAGNOSIS — Z79891 Long term (current) use of opiate analgesic: Secondary | ICD-10-CM | POA: Diagnosis not present

## 2017-07-27 DIAGNOSIS — M5441 Lumbago with sciatica, right side: Secondary | ICD-10-CM | POA: Diagnosis not present

## 2017-07-27 DIAGNOSIS — E785 Hyperlipidemia, unspecified: Secondary | ICD-10-CM | POA: Insufficient documentation

## 2017-07-27 DIAGNOSIS — Z79899 Other long term (current) drug therapy: Secondary | ICD-10-CM | POA: Diagnosis not present

## 2017-07-27 DIAGNOSIS — N19 Unspecified kidney failure: Secondary | ICD-10-CM | POA: Diagnosis not present

## 2017-07-27 DIAGNOSIS — M899 Disorder of bone, unspecified: Secondary | ICD-10-CM | POA: Insufficient documentation

## 2017-07-27 DIAGNOSIS — M4805 Spinal stenosis, thoracolumbar region: Secondary | ICD-10-CM | POA: Diagnosis not present

## 2017-07-27 DIAGNOSIS — Z9660 Presence of unspecified orthopedic joint implant: Secondary | ICD-10-CM | POA: Insufficient documentation

## 2017-07-27 DIAGNOSIS — M546 Pain in thoracic spine: Secondary | ICD-10-CM | POA: Insufficient documentation

## 2017-07-27 DIAGNOSIS — M25562 Pain in left knee: Secondary | ICD-10-CM

## 2017-07-27 DIAGNOSIS — M79604 Pain in right leg: Secondary | ICD-10-CM | POA: Insufficient documentation

## 2017-07-27 DIAGNOSIS — R7303 Prediabetes: Secondary | ICD-10-CM | POA: Insufficient documentation

## 2017-07-27 DIAGNOSIS — M7989 Other specified soft tissue disorders: Secondary | ICD-10-CM | POA: Insufficient documentation

## 2017-07-27 DIAGNOSIS — J439 Emphysema, unspecified: Secondary | ICD-10-CM | POA: Diagnosis not present

## 2017-07-27 DIAGNOSIS — M797 Fibromyalgia: Secondary | ICD-10-CM | POA: Insufficient documentation

## 2017-07-27 DIAGNOSIS — M25561 Pain in right knee: Secondary | ICD-10-CM

## 2017-07-27 DIAGNOSIS — E559 Vitamin D deficiency, unspecified: Secondary | ICD-10-CM | POA: Insufficient documentation

## 2017-07-27 NOTE — Progress Notes (Signed)
Safety precautions to be maintained throughout the outpatient stay will include: orient to surroundings, keep bed in low position, maintain call bell within reach at all times, provide assistance with transfer out of bed and ambulation.  

## 2017-07-27 NOTE — Patient Instructions (Signed)

## 2017-07-27 NOTE — Progress Notes (Signed)
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 discharge from the practice.  ____________________________________________________________________________________________   ____________________________________________________________________________________________  Pain Scale  Introduction: The pain score used by this practice is the Verbal Numerical Rating Scale (VNRS-11). This is an 11-point scale. It is for adults and children 10 years or older. There are significant differences in how the pain score is reported, used, and applied. Forget everything you learned in the past and learn this scoring system.  General Information: The scale should reflect your current level of pain. Unless you are specifically asked for the level of your worst pain, or your average pain. If you are asked for one of these two, then it should be understood that it is over the past 24 hours.  Basic Activities of Daily Living (ADL): Personal hygiene, dressing, eating, transferring, and using restroom.  Instructions: Most patients tend to report their level of pain as a combination of two factors, their physical pain and their psychosocial pain. This last one is also known as "suffering" and it is reflection of how physical pain affects you socially and psychologically. From now on, report them separately. From this point on, when asked to report your pain level, report only your physical pain. Use the following table for reference.  Pain Clinic Pain Levels (0-5/10)  Pain Level Score  Description  No Pain 0   Mild pain 1 Nagging, annoying, but does not interfere with basic activities of daily living (ADL). Patients are able to eat, bathe, get  dressed, toileting (being able to get on and off the toilet and perform personal hygiene functions), transfer (move in and out of bed or a chair without assistance), and maintain continence (able to control bladder and bowel functions). Blood pressure and heart rate are unaffected. A normal heart rate for a healthy adult ranges from 60 to 100 bpm (beats per minute).   Mild to moderate pain 2 Noticeable and distracting. Impossible to hide from other people. More frequent flare-ups. Still possible to adapt and function close to normal. It can be very annoying and may have occasional stronger flare-ups. With discipline, patients may get used to it and adapt.   Moderate pain 3 Interferes significantly with activities of daily living (ADL). It becomes difficult to feed, bathe, get dressed, get on and off the toilet or to perform personal hygiene functions. Difficult to get in and out of bed or a chair without assistance. Very distracting. With effort, it can be ignored when deeply involved in activities.   Moderately severe pain 4 Impossible to ignore for more than a few minutes. With effort, patients may still be able to manage work or participate in some social activities. Very difficult to concentrate. Signs of autonomic nervous system discharge are evident: dilated pupils (mydriasis); mild sweating (diaphoresis); sleep interference. Heart rate becomes elevated (>115 bpm). Diastolic blood pressure (lower number) rises above 100 mmHg. Patients find relief in laying down and not moving.   Severe pain 5 Intense and extremely unpleasant. Associated with frowning face and frequent crying. Pain overwhelms the senses.  Ability to do any activity or maintain social relationships becomes significantly limited. Conversation becomes difficult. Pacing back and forth is common, as getting into a comfortable position is nearly impossible. Pain wakes you up from deep sleep. Physical signs will be obvious: pupillary  dilation; increased sweating; goosebumps; brisk reflexes; cold, clammy hands and feet; nausea, vomiting or  dry heaves; loss of appetite; significant sleep disturbance with inability to fall asleep or to remain asleep. When persistent, significant weight loss is observed due to the complete loss of appetite and sleep deprivation.  Blood pressure and heart rate becomes significantly elevated. Caution: If elevated blood pressure triggers a pounding headache associated with blurred vision, then the patient should immediately seek attention at an urgent or emergency care unit, as these may be signs of an impending stroke.    Emergency Department Pain Levels (6-10/10)  Emergency Room Pain 6 Severely limiting. Requires emergency care and should not be seen or managed at an outpatient pain management facility. Communication becomes difficult and requires great effort. Assistance to reach the emergency department may be required. Facial flushing and profuse sweating along with potentially dangerous increases in heart rate and blood pressure will be evident.   Distressing pain 7 Self-care is very difficult. Assistance is required to transport, or use restroom. Assistance to reach the emergency department will be required. Tasks requiring coordination, such as bathing and getting dressed become very difficult.   Disabling pain 8 Self-care is no longer possible. At this level, pain is disabling. The individual is unable to do even the most "basic" activities such as walking, eating, bathing, dressing, transferring to a bed, or toileting. Fine motor skills are lost. It is difficult to think clearly.   Incapacitating pain 9 Pain becomes incapacitating. Thought processing is no longer possible. Difficult to remember your own name. Control of movement and coordination are lost.   The worst pain imaginable 10 At this level, most patients pass out from pain. When this level is reached, collapse of the autonomic nervous  system occurs, leading to a sudden drop in blood pressure and heart rate. This in turn results in a temporary and dramatic drop in blood flow to the brain, leading to a loss of consciousness. Fainting is one of the body's self defense mechanisms. Passing out puts the brain in a calmed state and causes it to shut down for a while, in order to begin the healing process.    Summary: 1. Refer to this scale when providing [place] with your pain level. 2. Be accurate and careful when reporting your pain level. This will help with your care. 3. Over-reporting your pain level will lead to loss of credibility. 4. Even a level of 1/10 means that there is pain and will be treated at our facility. 5. High, inaccurate reporting will be documented as "Symptom Exaggeration", leading to loss of credibility and suspicions of possible secondary gains such as obtaining more narcotics, or wanting to appear disabled, for fraudulent reasons. 6. Only pain levels of 5 or below will be seen at our facility. 7. Pain levels of 6 and above will be sent to the Emergency Department and the appointment cancelled. ____________________________________________________________________________________________

## 2017-07-29 ENCOUNTER — Telehealth: Payer: Self-pay | Admitting: *Deleted

## 2017-07-29 NOTE — Telephone Encounter (Signed)
Received notification from [facility] that patient has been dismissed for the following reasons:  1) non-compliant urine screen 2) non-compliant narcotic profile 3) major discrepancies in random/routine pill count 4) non-compliant with treatment 5) disruptive behavior 6) possibility of diversion 7) brought wrong pills for pill count - walked out of office.  Printed report received sent for scanning to patient's chart.  Dr. Krista Blue has reviewed this information.

## 2017-08-01 LAB — COMP. METABOLIC PANEL (12)
ALBUMIN: 4.5 g/dL (ref 3.5–5.5)
ALK PHOS: 92 IU/L (ref 39–117)
AST: 15 IU/L (ref 0–40)
Albumin/Globulin Ratio: 1.6 (ref 1.2–2.2)
BUN/Creatinine Ratio: 9 (ref 9–23)
BUN: 14 mg/dL (ref 6–24)
Calcium: 10.8 mg/dL — ABNORMAL HIGH (ref 8.7–10.2)
Chloride: 103 mmol/L (ref 96–106)
Creatinine, Ser: 1.48 mg/dL — ABNORMAL HIGH (ref 0.57–1.00)
GFR calc Af Amer: 45 mL/min/{1.73_m2} — ABNORMAL LOW (ref >59)
GFR calc non Af Amer: 39 mL/min/{1.73_m2} — ABNORMAL LOW (ref >59)
GLOBULIN, TOTAL: 2.9 g/dL (ref 1.5–4.5)
Glucose: 96 mg/dL (ref 65–99)
Potassium: 4.9 mmol/L (ref 3.5–5.2)
SODIUM: 141 mmol/L (ref 134–144)
Total Protein: 7.4 g/dL (ref 6.0–8.5)

## 2017-08-01 LAB — C-REACTIVE PROTEIN: CRP: 5.3 mg/L — AB (ref 0.0–4.9)

## 2017-08-01 LAB — 25-HYDROXY VITAMIN D LCMS D2+D3
25-Hydroxy, Vitamin D-3: 32 ng/mL
25-Hydroxy, Vitamin D: 32 ng/mL

## 2017-08-01 LAB — COMPLIANCE DRUG ANALYSIS, UR

## 2017-08-01 LAB — MAGNESIUM: Magnesium: 2.1 mg/dL (ref 1.6–2.3)

## 2017-08-01 LAB — SEDIMENTATION RATE: Sed Rate: 36 mm/hr (ref 0–40)

## 2017-08-01 LAB — 25-HYDROXYVITAMIN D LCMS D2+D3

## 2017-08-01 LAB — VITAMIN B12: Vitamin B-12: 521 pg/mL (ref 232–1245)

## 2017-08-04 ENCOUNTER — Other Ambulatory Visit: Payer: Self-pay

## 2017-08-25 ENCOUNTER — Emergency Department: Payer: Medicaid Other

## 2017-08-25 ENCOUNTER — Encounter: Payer: Self-pay | Admitting: Emergency Medicine

## 2017-08-25 ENCOUNTER — Emergency Department
Admission: EM | Admit: 2017-08-25 | Discharge: 2017-08-26 | Disposition: A | Discharge status: Home / Self Care | Payer: Medicaid Other | Attending: Emergency Medicine | Admitting: Emergency Medicine

## 2017-08-25 ENCOUNTER — Other Ambulatory Visit: Payer: Self-pay

## 2017-08-25 DIAGNOSIS — F17210 Nicotine dependence, cigarettes, uncomplicated: Secondary | ICD-10-CM | POA: Diagnosis not present

## 2017-08-25 DIAGNOSIS — S46209A Unspecified injury of muscle, fascia and tendon of other parts of biceps, unspecified arm, initial encounter: Secondary | ICD-10-CM

## 2017-08-25 DIAGNOSIS — S46112A Strain of muscle, fascia and tendon of long head of biceps, left arm, initial encounter: Secondary | ICD-10-CM | POA: Insufficient documentation

## 2017-08-25 DIAGNOSIS — Z79899 Other long term (current) drug therapy: Secondary | ICD-10-CM | POA: Diagnosis not present

## 2017-08-25 DIAGNOSIS — R2232 Localized swelling, mass and lump, left upper limb: Secondary | ICD-10-CM | POA: Diagnosis present

## 2017-08-25 DIAGNOSIS — X500XXA Overexertion from strenuous movement or load, initial encounter: Secondary | ICD-10-CM | POA: Diagnosis not present

## 2017-08-25 DIAGNOSIS — Y9389 Activity, other specified: Secondary | ICD-10-CM | POA: Diagnosis not present

## 2017-08-25 DIAGNOSIS — Y9289 Other specified places as the place of occurrence of the external cause: Secondary | ICD-10-CM | POA: Insufficient documentation

## 2017-08-25 DIAGNOSIS — Y999 Unspecified external cause status: Secondary | ICD-10-CM | POA: Insufficient documentation

## 2017-08-25 DIAGNOSIS — J449 Chronic obstructive pulmonary disease, unspecified: Secondary | ICD-10-CM | POA: Diagnosis not present

## 2017-08-25 MED ORDER — ONDANSETRON 4 MG PO TBDP
4.0000 mg | ORAL_TABLET | Freq: Once | ORAL | Status: AC
  Administered 2017-08-25: 4 mg via ORAL

## 2017-08-25 MED ORDER — ONDANSETRON 4 MG PO TBDP
ORAL_TABLET | ORAL | Status: AC
  Filled 2017-08-25: qty 1

## 2017-08-25 MED ORDER — MORPHINE SULFATE (PF) 4 MG/ML IV SOLN
4.0000 mg | Freq: Once | INTRAVENOUS | Status: AC
  Administered 2017-08-25: 4 mg via INTRAMUSCULAR

## 2017-08-25 MED ORDER — MORPHINE SULFATE (PF) 4 MG/ML IV SOLN
INTRAVENOUS | Status: AC
  Filled 2017-08-25: qty 1

## 2017-08-25 NOTE — ED Notes (Addendum)
Dr. Brown at the bedside for pt evaluation 

## 2017-08-25 NOTE — ED Triage Notes (Signed)
Pt arrived to the ED accompanied by her husband for complaints of left arm pain. Pt states that she had multiple surgeries on the affected arm and today she noted increased pain, swelling and bruising. Pt has decreased range of motion on left arm with intact pulsed and decreased sensation. Pt is AOx4 in no apparent distress, very talkative during triage.

## 2017-08-26 ENCOUNTER — Emergency Department: Payer: Medicaid Other

## 2017-08-26 MED ORDER — LORAZEPAM 2 MG/ML IJ SOLN
1.0000 mg | Freq: Once | INTRAMUSCULAR | Status: AC
  Administered 2017-08-26: 1 mg via INTRAMUSCULAR

## 2017-08-26 MED ORDER — OXYCODONE HCL 10 MG PO TABS: 10.0000 mg | ORAL_TABLET | Freq: Four times a day (QID) | ORAL | 0 refills | Status: DC | PRN

## 2017-08-26 MED ORDER — OXYCODONE HCL 5 MG PO TABS
10.0000 mg | ORAL_TABLET | Freq: Once | ORAL | Status: AC
  Administered 2017-08-26: 10 mg via ORAL
  Filled 2017-08-26: qty 2

## 2017-08-26 MED ORDER — LORAZEPAM 2 MG/ML IJ SOLN
1.0000 mg | Freq: Once | INTRAMUSCULAR | Status: DC
  Filled 2017-08-26: qty 1

## 2017-08-26 NOTE — ED Notes (Signed)
IV attempted 2x and was unsuccessful and Amy C to look

## 2017-08-26 NOTE — ED Notes (Signed)
Patient to MRI.

## 2017-08-26 NOTE — ED Notes (Signed)
Patient just got back from Ultrasound and MRI tech called in
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
## 2017-09-07 ENCOUNTER — Ambulatory Visit: Payer: Medicaid Other | Admitting: Internal Medicine

## 2017-09-08 IMAGING — DX DG LUMBAR SPINE COMPLETE 4+V
7 series · 7 of 7 positions shown · non-contrast
Comparison: 06/03/2016 and prior radiographs

CLINICAL DATA: Chronic lumbar spine pain. History of lumbar spine
surgery

EXAM:
LUMBAR SPINE - COMPLETE 4+ VIEW

[l-spine ap]
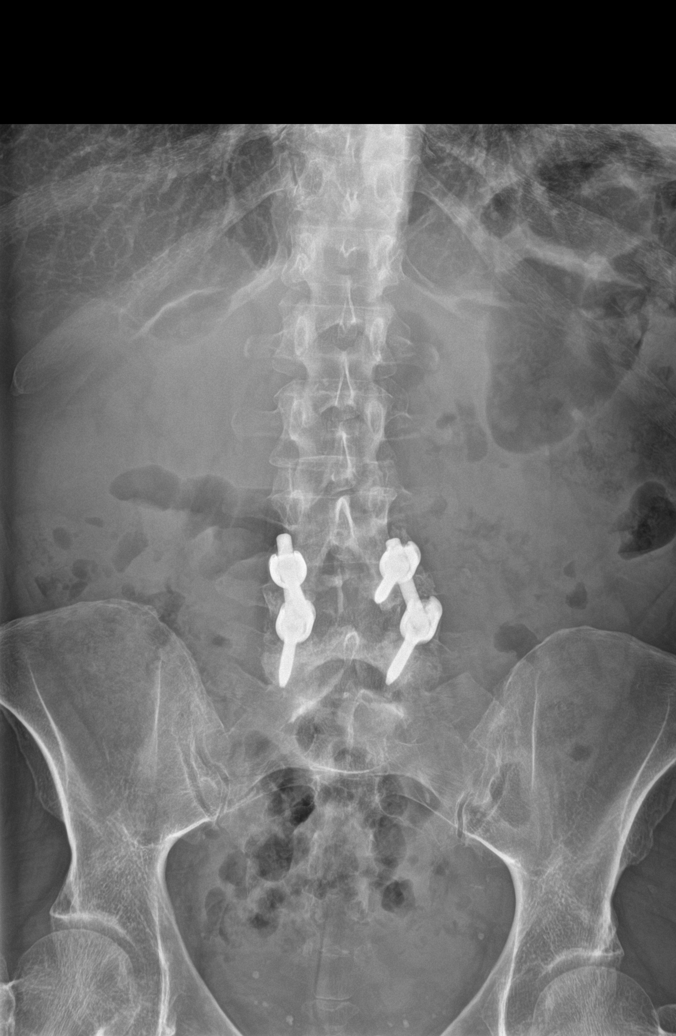

[l-spine obl (1 of 3)]
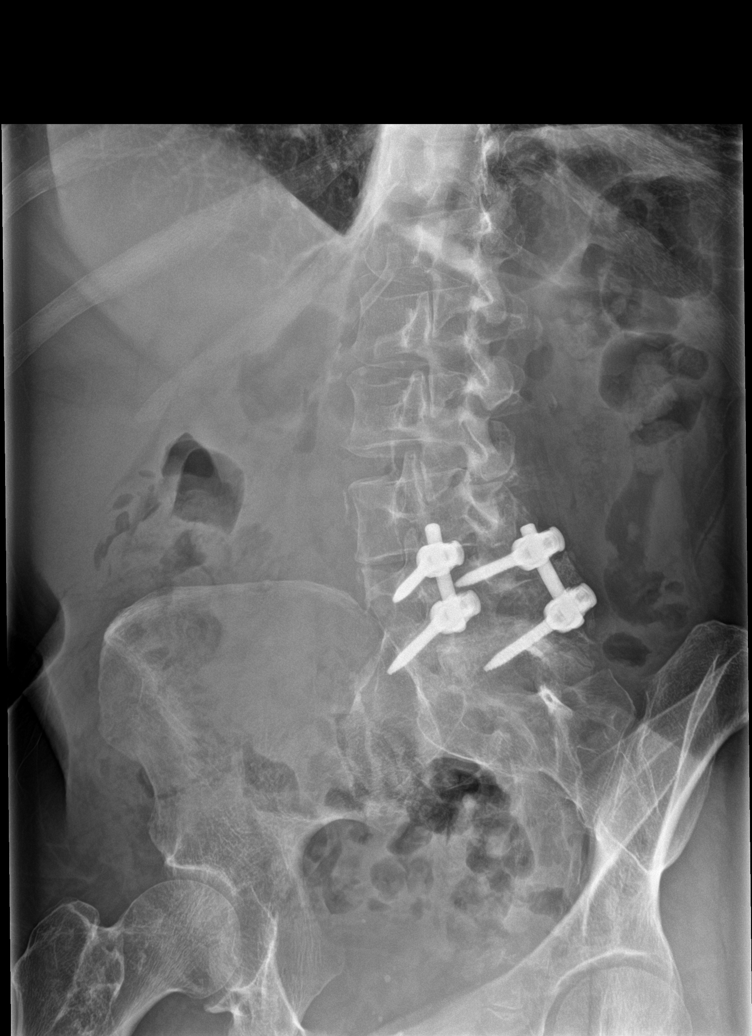

[l-spine obl (2 of 3)]
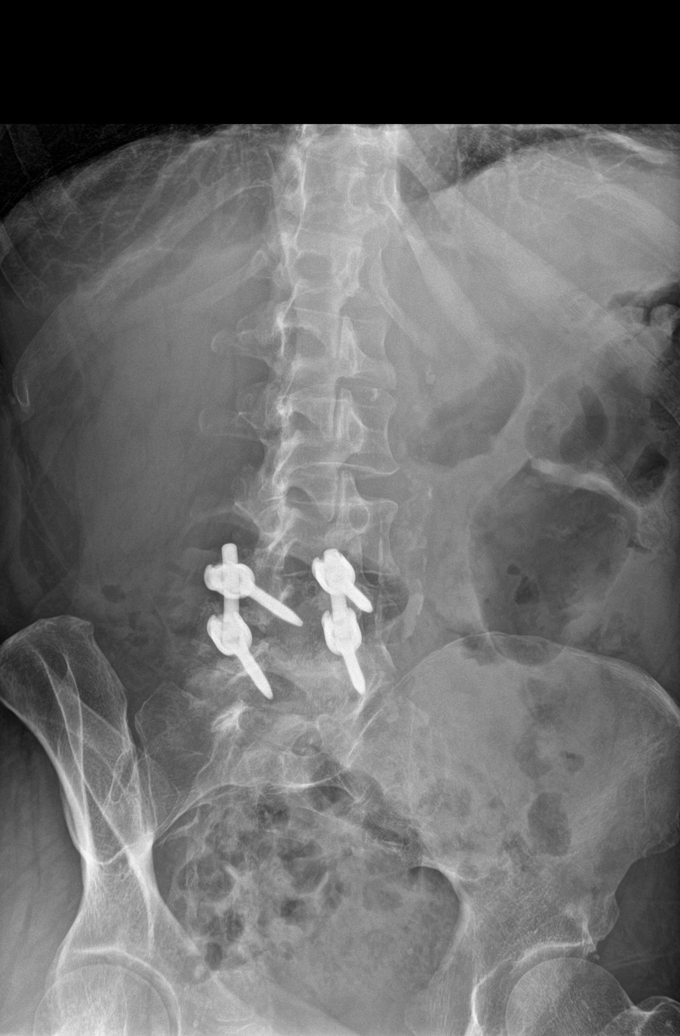

[l-spine lat (1 of 2)]
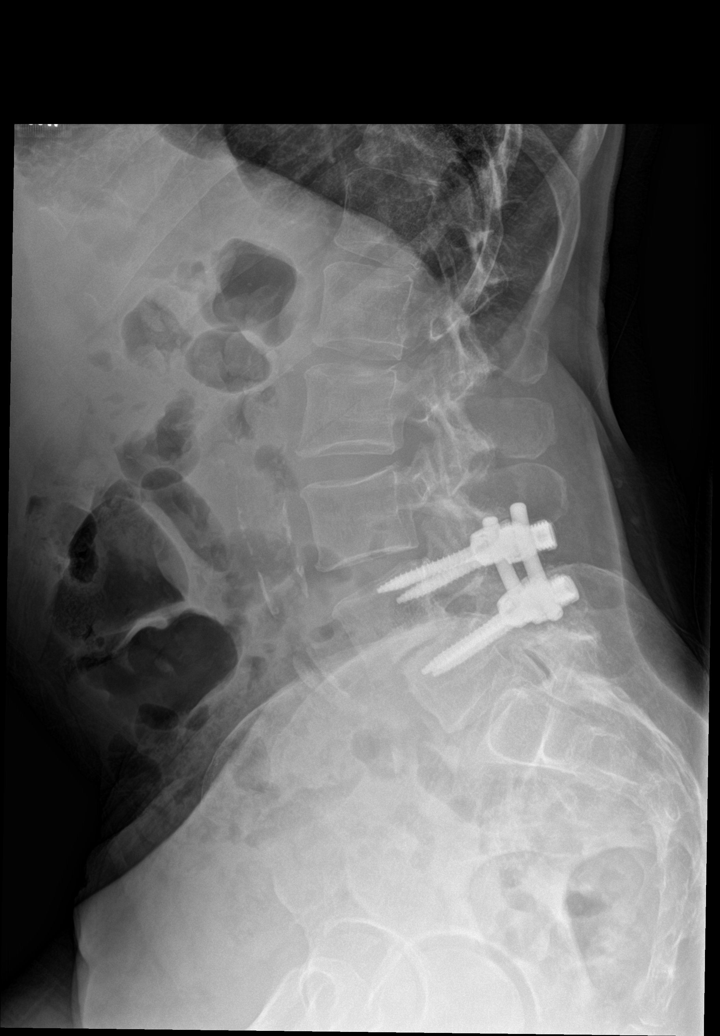

[l-spine spot]
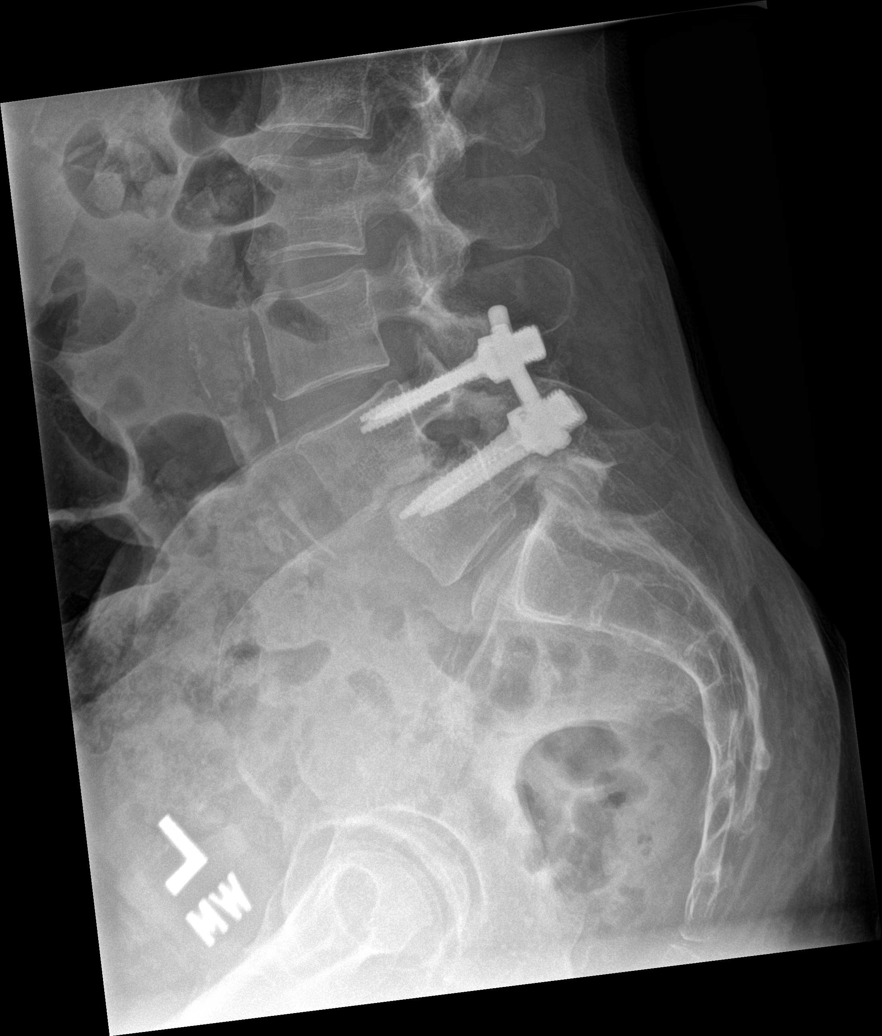

[l-spine obl (3 of 3)]
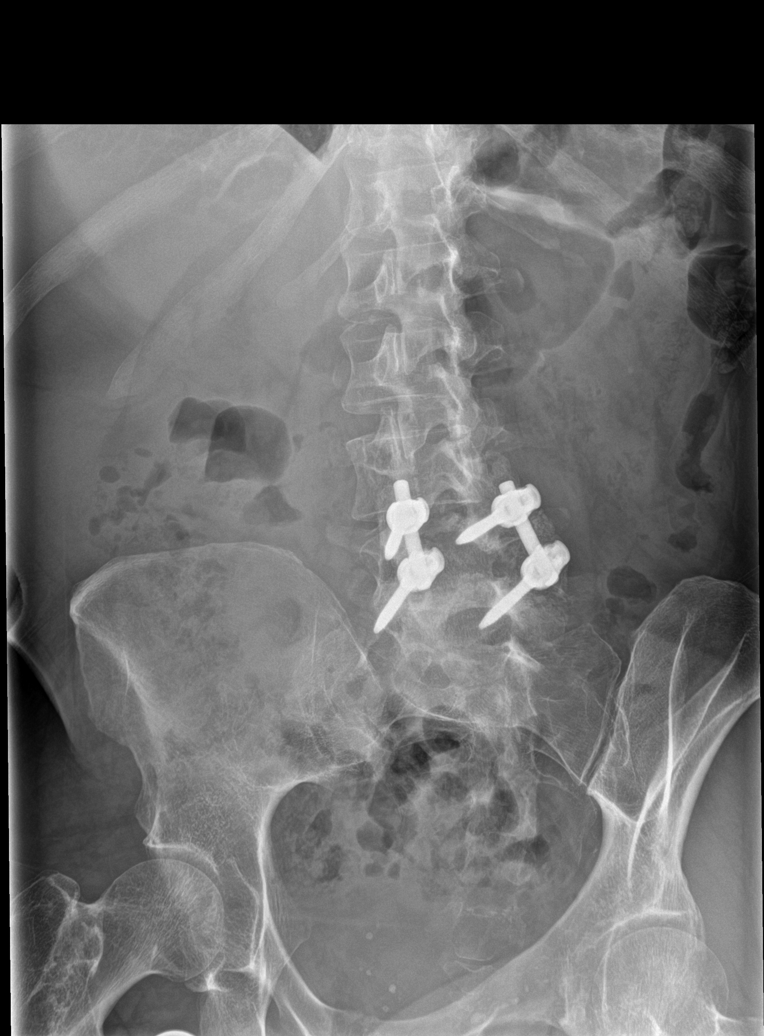

[l-spine lat (2 of 2)]
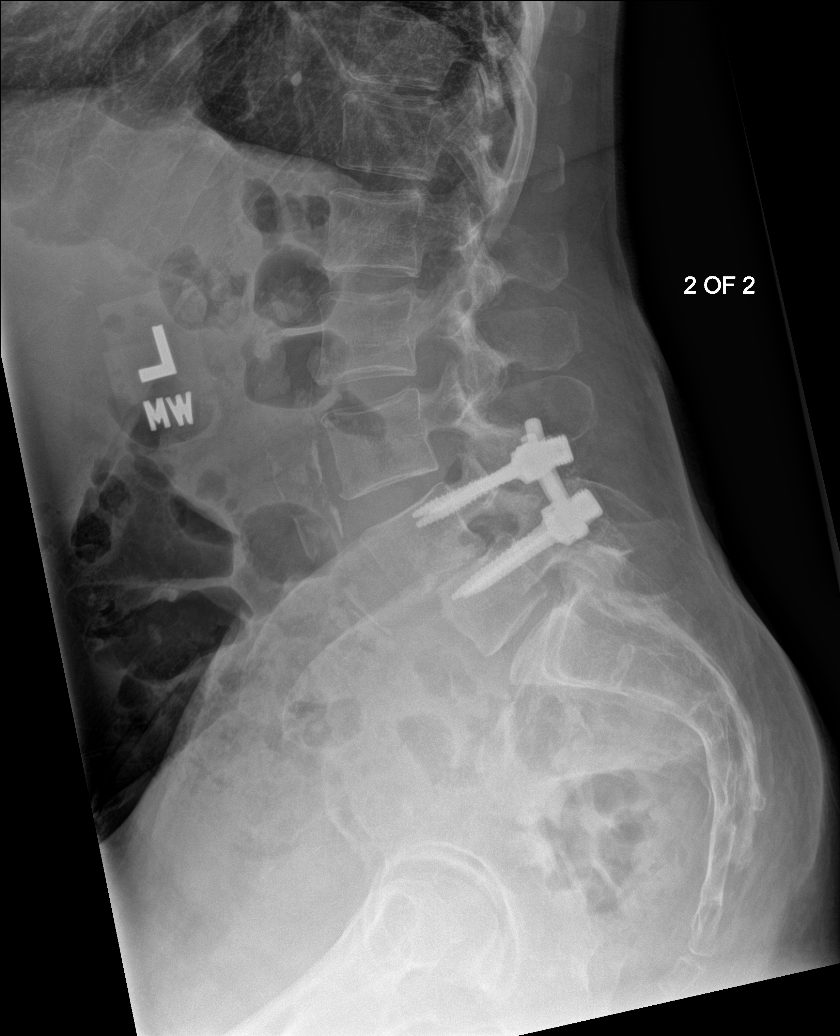

[7 of 7 positions shown; findings below may reference images not displayed]

FINDINGS: No acute fracture or new subluxation identified.

Posterior rod and bi pedicular screw fixation and laminectomy at
L4-5 again identified with unchanged 8 mm anterolisthesis of L4 on
5.

No definite complicating hardware features are noted.

Mild degenerative disc disease at L5-S1 is unchanged.
IMPRESSION: No acute abnormalities.

Unchanged posterior fusion L4-5 with 8 mm spondylolisthesis.

## 2017-09-08 IMAGING — DX DG THORACIC SPINE 3V
3 series · 3 of 3 positions shown · non-contrast
Comparison: 07/27/2016

CLINICAL DATA: Back pain

EXAM:
THORACIC SPINE - 3 VIEWS

[t-spine ap]
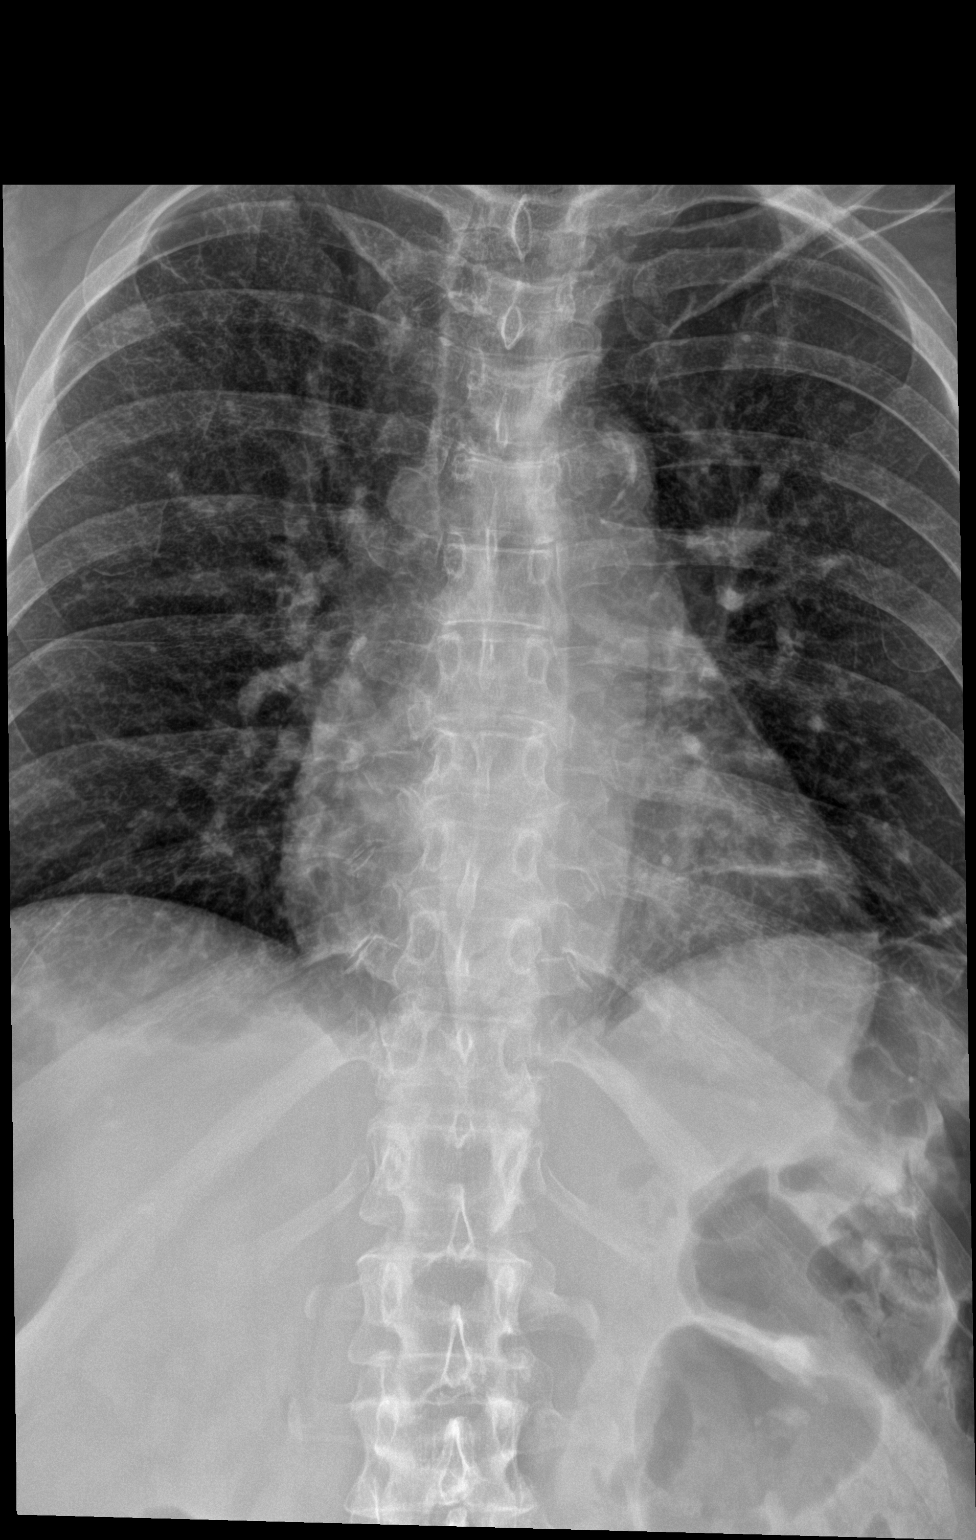

[t-spine swimmers]
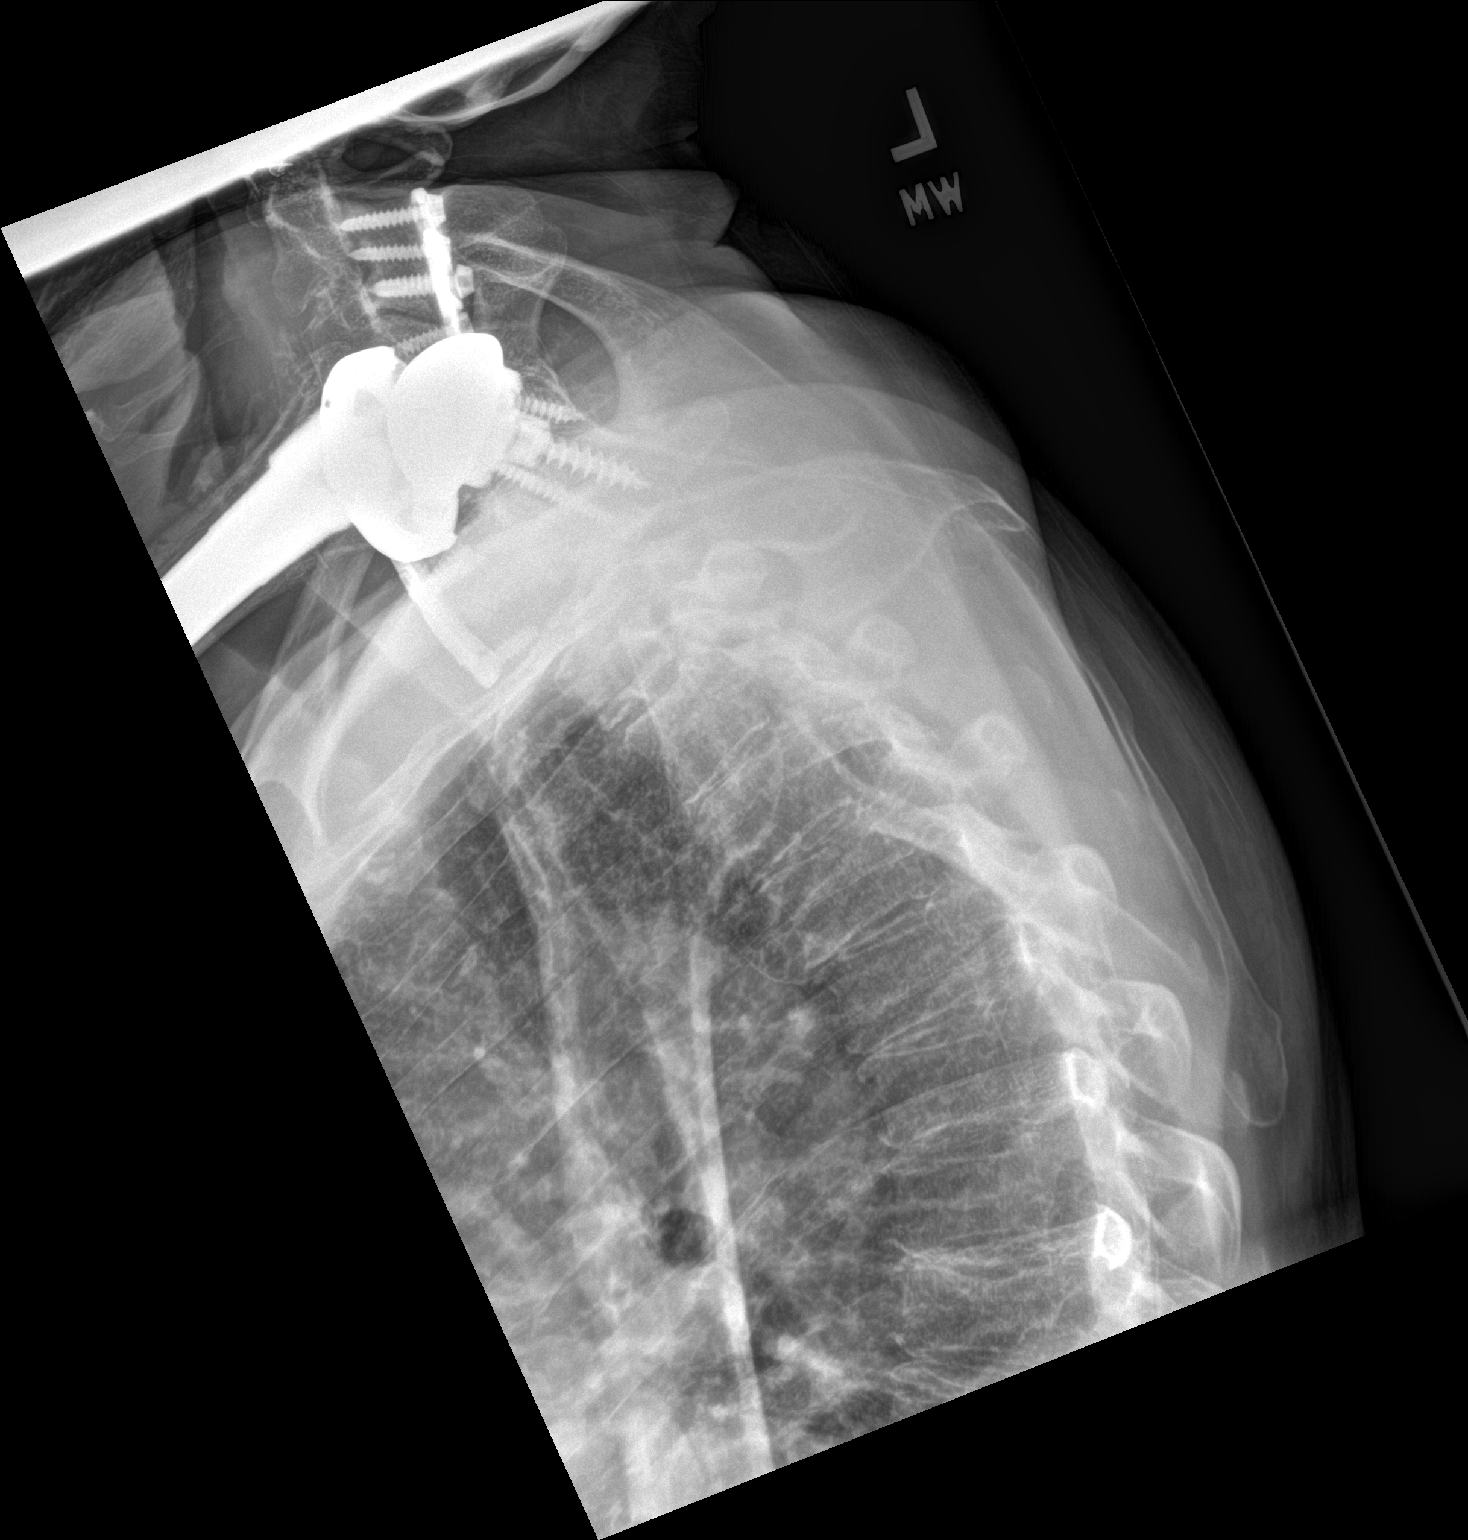

[t-spine lat]
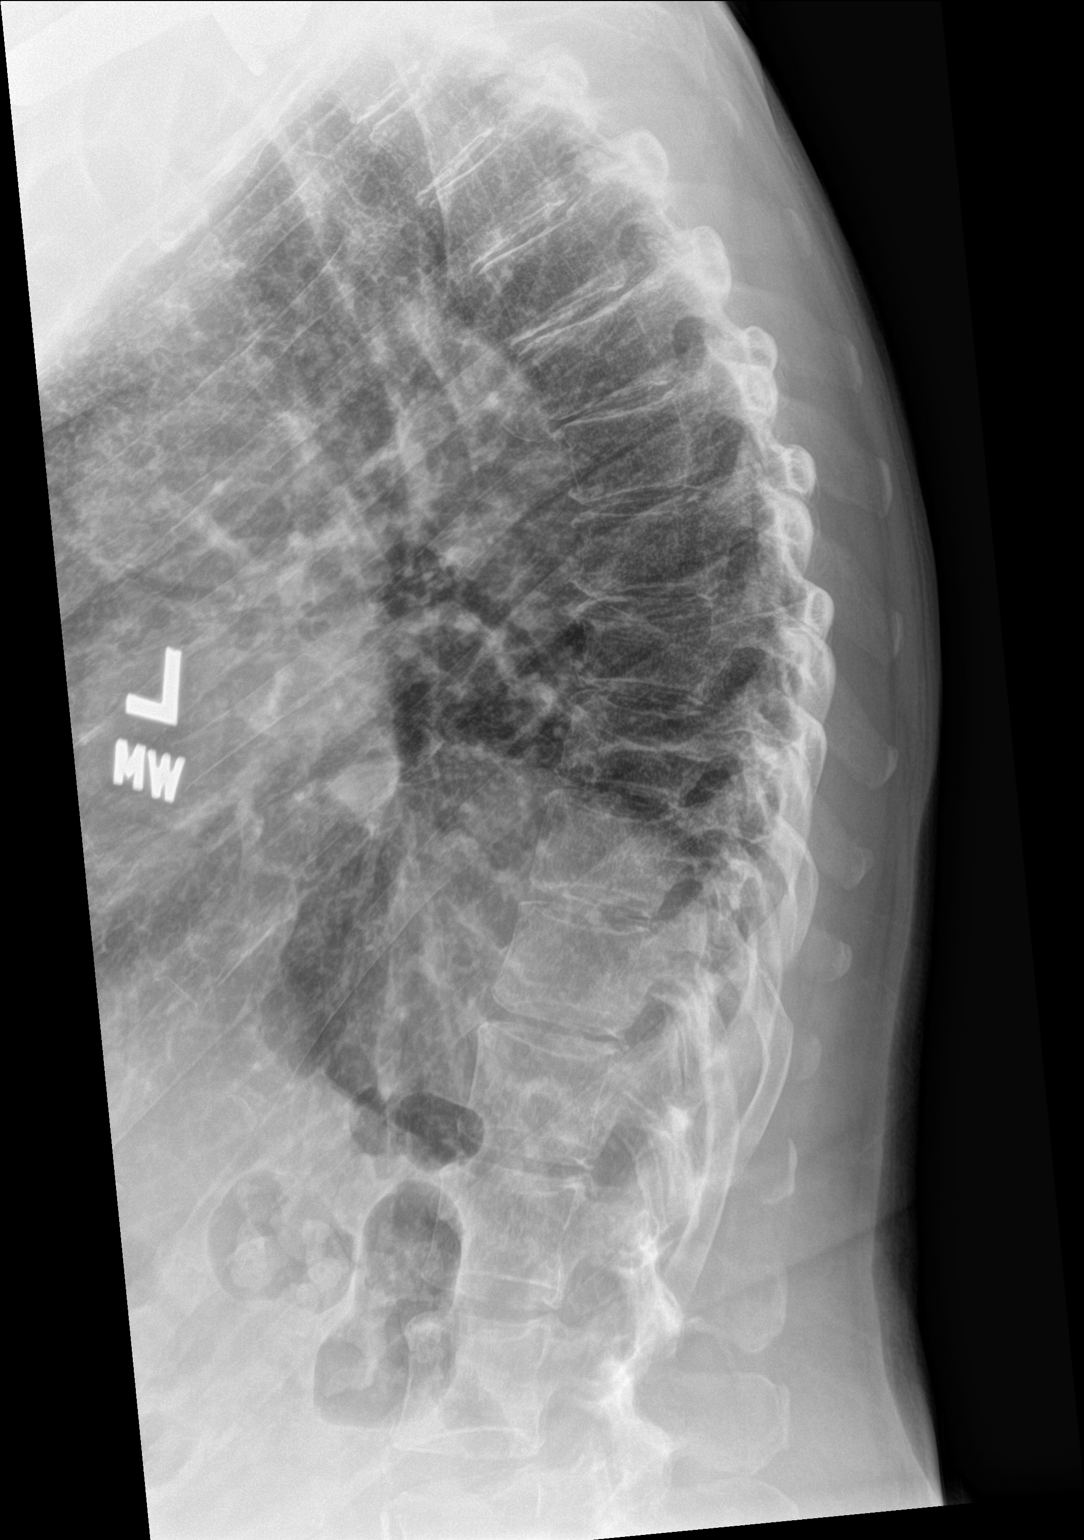

[3 of 3 positions shown; findings below may reference images not displayed]

FINDINGS: Normal alignment of the thoracic spine. The bones appear osteopenic.
The vertebral body heights are well preserved. Mild multi level disc
space narrowing and ventral endplate spurring noted within the mid
thoracic spine.
IMPRESSION: 1. No acute findings.
2. Mild thoracic spondylosis noted.

## 2017-09-12 ENCOUNTER — Telehealth: Payer: Self-pay | Admitting: *Deleted

## 2017-09-12 NOTE — Telephone Encounter (Signed)
Spoke to patient to cancel appt due to weather.  She would like to be seen this week.

## 2017-09-13 ENCOUNTER — Ambulatory Visit (INDEPENDENT_AMBULATORY_CARE_PROVIDER_SITE_OTHER): Payer: Medicaid Other | Admitting: Internal Medicine

## 2017-09-13 ENCOUNTER — Institutional Professional Consult (permissible substitution): Payer: Self-pay | Admitting: Neurology

## 2017-09-13 ENCOUNTER — Encounter: Payer: Self-pay | Admitting: Internal Medicine

## 2017-09-13 ENCOUNTER — Ambulatory Visit
Admission: RE | Admit: 2017-09-13 | Discharge: 2017-09-13 | Disposition: A | Discharge status: Home / Self Care | Payer: Medicaid Other | Source: Ambulatory Visit | Attending: Internal Medicine | Admitting: Internal Medicine

## 2017-09-13 VITALS — BP 148/86 | HR 86 | Ht 60.0 in | Wt 134.0 lb

## 2017-09-13 DIAGNOSIS — J432 Centrilobular emphysema: Secondary | ICD-10-CM | POA: Diagnosis not present

## 2017-09-13 DIAGNOSIS — R071 Chest pain on breathing: Secondary | ICD-10-CM

## 2017-09-13 DIAGNOSIS — R0789 Other chest pain: Secondary | ICD-10-CM | POA: Diagnosis not present

## 2017-09-13 MED ORDER — DIAZEPAM 2 MG PO TABS: 2.0000 mg | ORAL_TABLET | Freq: Two times a day (BID) | ORAL | 0 refills | Status: DC | PRN

## 2017-09-13 MED ORDER — MOMETASONE FURO-FORMOTEROL FUM 200-5 MCG/ACT IN AERO: 2.0000 | INHALATION_SPRAY | Freq: Two times a day (BID) | RESPIRATORY_TRACT | 5 refills | Status: DC

## 2017-09-13 MED ORDER — IOPAMIDOL (ISOVUE-370) INJECTION 76%: 75.0000 mL | Freq: Once | INTRAVENOUS | Status: DC | PRN

## 2017-09-13 NOTE — Telephone Encounter (Signed)
Pt requested to be seen this week.  Dr. Krista Blue has reviewed her chart and has ordered a NCV/EMG test.  The only way to complete it this week is to have 1.5 hour of down time in between appts.  Pt is agreeable to this and accepted the appt times on 09/15/2017.

## 2017-09-13 NOTE — Progress Notes (Signed)
   Name: Jamie Schultz MRN: [id number] DOB: Mar 08, 1961   CONSULTATION DATE: 09/13/2017   REFERRING MD : Robyne Askew  CHIEF COMPLAINT: SOB  HISTORY OF PRESENT ILLNESS:   57 yo white female with SOB, chronic in nature Dx with COPD 3-4 years ago +assocaited with intermittent wheezing and coughing spells Still smokes 1 ppd Chronic smoker 30 pack year  On disability for unknown reason since 1995 Has tried BREo and does NOT help Has tried nasocort and has helped Uses albuterol as needed and that seems to help  Intermittent dulera Has intermittent chest pain Left arm tingling Left sided chest pain  Office Spiro-shows no obvious obstruction but clinical history suggests COPD  H/o intubation and renal failure several years ago 8 day ICU stay  No sign of lower ext edema, no signs of infection at this time   ALL OTHER ROS ARE NEGATIVE   BP (!) 148/86 (BP Location: Left Arm, Cuff Size: Normal)   Pulse 86   Ht 5' (1.524 m)   Wt 134 lb (60.8 kg)   SpO2 97%   BMI 26.17 kg/m   Physical Examination:   GENERAL:NAD, no fevers, chills, no weakness no fatigue HEAD: Normocephalic, atraumatic.  EYES: Pupils equal, round, reactive to light. Extraocular muscles intact. No scleral icterus.  MOUTH: Moist mucosal membrane.   EAR, NOSE, THROAT: Clear without exudates. No external lesions.  NECK: Supple. No thyromegaly. No nodules. No JVD.  PULMONARY:CTA B/L no wheezes, no crackles, no rhonchi CARDIOVASCULAR: S1 and S2. Regular rate and rhythm. No murmurs, rubs, or gallops. No edema.  PSYCHIATRIC: Mood, affect within normal limits. The patient is awake, alert and oriented x 3. Insight, judgment intact.    ASSESSMENT / PLAN: 57 yo white female with ongoing tobacco use with SOB and wheezing findings to suggest COPD She has intermittent chest pain  1.SOB and Wheezing-likely from COPD -will use albuterol as needed Will need to take [facility] daily  2.COPD-Mild Gold stage A Continue  inhalers as prescribed  3.Smoking cessation strongly advises   4.no need for steroids or bronc at this time  5.patient wants valium-needs to follow up with PCP for further refills  6.atypical chest pain -needs Ct chest for PE cardiology refererral  Patient/Family are satisfied with Plan of action and management. All questions answered  Corrin Parker, M.D.  [facility]  Medical Director [place] Director [facility] Cardio-Pulmonary Department

## 2017-09-13 NOTE — Patient Instructions (Signed)
CT chest r/o PE Cardiology evaluation

## 2017-09-13 NOTE — Progress Notes (Signed)
   Name: Jamie Schultz MRN: [id number] DOB: 08-21-1961   CONSULTATION DATE: 09/13/2017   REFERRING MD : Robyne Askew  CHIEF COMPLAINT: SOB  HISTORY OF PRESENT ILLNESS:   57 yo white female with SOB, chronic in nature Dx with COPD 3-4 years ago +assocaited with intermittent wheezing and coughing spells Still smokes 1 ppd Chronic smoker 30 pack year Wants to quit May 30Th On disability for unknown reason since 1995 Has tried BREo and does NOT help Has tried nasocort and has helped Uses albuterol as needed and that seems to help  Office Spiro-shows no obvious obstruction but clinical history suggests COPD  H/o intubation and renal failure several years ago 8 day ICU stay  No sign of lower ext edema, no signs of infection at this time   REVIEW OF SYSTEMS:   Constitutional: Negative for fever, chills, weight loss, malaise/fatigue and diaphoresis.  HENT: Negative for hearing loss, ear pain, nosebleeds, congestion, sore throat, neck pain, tinnitus and ear discharge.   Eyes: Negative for blurred vision, double vision, photophobia, pain, discharge and redness.  Respiratory: Negative for cough, hemoptysis, sputum production, shortness of breath, wheezing and stridor.   Cardiovascular: Negative for chest pain, palpitations, orthopnea, claudication, leg swelling and PND.  Gastrointestinal: Negative for heartburn, nausea, vomiting, abdominal pain, diarrhea, constipation, blood in stool and melena.  Genitourinary: Negative for dysuria, urgency, frequency, hematuria and flank pain.  Musculoskeletal: Negative for myalgias, back pain, joint pain and falls.  Skin: Negative for itching and rash.  Neurological: Negative for dizziness, tingling, tremors, sensory change, speech change, focal weakness, seizures, loss of consciousness, weakness and headaches.  Endo/Heme/Allergies: Negative for environmental allergies and polydipsia. Does not bruise/bleed easily. ALL OTHER ROS ARE NEGATIVE   BP (!)  148/86 (BP Location: Left Arm, Cuff Size: Normal)   Pulse 86   Ht 5' (1.524 m)   Wt 134 lb (60.8 kg)   SpO2 97%   BMI 26.17 kg/m   Physical Examination:   GENERAL:NAD, no fevers, chills, no weakness no fatigue HEAD: Normocephalic, atraumatic.  EYES: Pupils equal, round, reactive to light. Extraocular muscles intact. No scleral icterus.  MOUTH: Moist mucosal membrane.   EAR, NOSE, THROAT: Clear without exudates. No external lesions.  NECK: Supple. No thyromegaly. No nodules. No JVD.  PULMONARY:CTA B/L no wheezes, no crackles, no rhonchi CARDIOVASCULAR: S1 and S2. Regular rate and rhythm. No murmurs, rubs, or gallops. No edema.  GASTROINTESTINAL: Soft, nontender, nondistended. No masses. Positive bowel sounds.  MUSCULOSKELETAL: No swelling, clubbing, or edema. Range of motion full in all extremities.  NEUROLOGIC: Cranial nerves II through XII are intact. No gross focal neurological deficits.  SKIN: No ulceration, lesions, rashes, or cyanosis. Skin warm and dry. Turgor intact.  PSYCHIATRIC: Mood, affect within normal limits. The patient is awake, alert and oriented x 3. Insight, judgment intact.    ASSESSMENT / PLAN: 57 yo white female with ongoing tobacco use with SOB and wheezing findings to suggest COPD  1.SOB and Wheezing-likely from COPD -will use albuterol as needed  2.COPD-Mild Gold stage A Will start STIOLTO(long acting  AC/LABA)  3.Smoking cessation strongly advises She has set quit date of May 30th  4.no need for steroids or bronc at this time  Patient/Family are satisfied with Plan of action and management. All questions answered  Corrin Parker, M.D.  [facility]  Medical Director [employer] Director [facility] Cardio-Pulmonary Department

## 2017-09-15 ENCOUNTER — Encounter (INDEPENDENT_AMBULATORY_CARE_PROVIDER_SITE_OTHER): Payer: Medicaid Other | Admitting: Neurology

## 2017-09-15 ENCOUNTER — Ambulatory Visit: Payer: Medicaid Other | Admitting: Neurology

## 2017-09-15 DIAGNOSIS — G8929 Other chronic pain: Secondary | ICD-10-CM

## 2017-09-15 DIAGNOSIS — R202 Paresthesia of skin: Secondary | ICD-10-CM | POA: Diagnosis not present

## 2017-09-15 DIAGNOSIS — M5441 Lumbago with sciatica, right side: Secondary | ICD-10-CM | POA: Diagnosis not present

## 2017-09-15 DIAGNOSIS — M5442 Lumbago with sciatica, left side: Secondary | ICD-10-CM | POA: Diagnosis not present

## 2017-09-15 DIAGNOSIS — Z0289 Encounter for other administrative examinations: Secondary | ICD-10-CM

## 2017-09-15 DIAGNOSIS — M4316 Spondylolisthesis, lumbar region: Secondary | ICD-10-CM

## 2017-09-15 MED ORDER — LYRICA 100 MG PO CAPS: 100.0000 mg | ORAL_CAPSULE | Freq: Three times a day (TID) | ORAL | 5 refills | Status: DC

## 2017-09-15 MED ORDER — DULOXETINE HCL 60 MG PO CPEP: 60.0000 mg | ORAL_CAPSULE | Freq: Every day | ORAL | 12 refills | Status: DC

## 2017-09-15 NOTE — Procedures (Signed)
Full Name: Jamie Schultz Gender: Female MRN #: [id number] Date of Birth: 05/10/1961    Visit Date: 09/15/2017 10:44 Age: 57 Years 4 Months Old Examining Physician: Sarina Ill, MD  Referring Physician: Jaynee Eagles, MD History: 57 year old female, complains of chronic neck, low back pain, diffuse body achy pain, 4 extremity paresthesia, gait abnormality.  Summary of the tests:  Nerve conduction study: Right sural sensory responses showed mildly decreased to snap amplitude.  Right superficial peroneal sensory response was absent.  Left median mixed response was 0.6 ms prolonged in comparison to left ulnar mixed response.  Right tibial motor responses were normal.  Right peroneal to EDB motor responses showed mildly decreased the C map amplitude, with moderately prolonged distal latency.  Left median, ulnar motor responses were normal.  Electromyography: Selective needle examinations of right lower extremity muscles, right lumbosacral paraspinal muscles; left upper extremity muscles, left cervical paraspinal muscles were performed.  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  There is no significant abnormality at the left upper extremity muscles.  Conclusion: This is an abnormal study.  There is electrodiagnostic evidence of mild chronic right lumbosacral radiculopathy, there is no evidence of left cervical radiculopathy.    ------------------------------- Marcial Pacas, M.D.  [facility] [employer], [place] [address] Tel: [phone] Fax: [phone]        [facility]    Nerve / Sites Muscle Latency Ref. Amplitude Ref. Rel Amp Segments Distance Velocity Ref. Area    ms ms mV mV %  cm m/s m/s mVms  L Median - APB     Wrist APB 4.0 ?4.4 8.8 ?4.0 100 Wrist - APB 7   31.0     Upper arm APB 7.9  7.9  89.3 Upper arm - Wrist 19 49 ?49 27.8  L Ulnar - ADM     Wrist ADM 3.2 ?3.3 9.2 ?6.0 100 Wrist -  ADM 7   29.2     B.Elbow ADM 6.8  7.7  84.2 B.Elbow - Wrist 18 51 ?49 26.1     A.Elbow ADM 8.7  9.3  121 A.Elbow - B.Elbow 10 52 ?49 30.3         A.Elbow - Wrist      R Peroneal - EDB     Ankle EDB 8.3 ?6.5 0.8 ?2.0 100 Ankle - EDB 9   3.4     Fib head EDB 15.4  1.2  150 Fib head - Ankle 28 40 ?44 4.8     Pop fossa EDB 17.3  0.3  27.3 Pop fossa - Fib head 8 40 ?44 0.8         Pop fossa - Ankle      R Tibial - AH     Ankle AH 5.2 ?5.8 10.0 ?4.0 100 Ankle - AH 9   25.7     Pop fossa AH 13.7  6.2  61.7 Pop fossa - Ankle 35 41 ?41 18.5             SNC    Nerve / Sites Rec. Site Peak Lat Ref.  Amp Ref. Segments Distance Peak Diff Ref.    ms ms V V  cm ms ms  R Sural - Ankle (Calf)     Calf Ankle 3.9 ?4.4 3 ?6 Calf - Ankle 14    R Superficial peroneal - Ankle     Lat leg Ankle NR ?4.4 NR ?6 Lat leg - Ankle  14    L Median, Ulnar - Transcarpal comparison     Median Palm Wrist 2.6 ?2.2 22 ?35 Median Palm - Wrist 8       Ulnar Palm Wrist 2.0 ?2.2 19 ?12 Ulnar Palm - Wrist 8          Median Palm - Ulnar Palm  0.6 ?0.4  L Median - Orthodromic (Dig II, Mid palm)     Dig II Wrist 3.1 ?3.4 18 ?10 Dig II - Wrist 94               F  Wave    Nerve F Lat Ref.   ms ms  L Ulnar - ADM 28.2 ?32.0  R Tibial - AH 52.1 ?56.0         EMG       EMG Summary Table    Spontaneous MUAP Recruitment  Muscle IA Fib PSW Fasc Other Amp Dur. Poly Pattern  R. Tibialis anterior Increased 1+ None None _______ Normal Normal Normal Reduced  R. Tibialis posterior Normal None None None _______ Normal Normal Normal Reduced  R. Gastrocnemius (Medial head) Normal None None None _______ Normal Normal Normal Reduced  R. Peroneus longus Normal None None None _______ Normal Normal Normal Reduced  R. Vastus lateralis Normal None None None _______ Normal Normal Normal Reduced  R. Lumbar paraspinals (mid) Normal None None None _______ Normal Normal Normal Normal  R. Lumbar paraspinals (low) Normal None None None _______  Normal Normal Normal Normal  L. First dorsal interosseous Normal None None None _______ Normal Normal Normal Normal  L. Pronator teres Normal None None None _______ Normal Normal Normal Normal  L. Biceps brachii Normal None None None _______ Normal Normal Normal Normal  L. Deltoid Normal None None None _______ Normal Normal Normal Normal  L. Triceps brachii Normal None None None _______ Normal Normal Normal Normal  L. Cervical paraspinals Normal None None None _______ Normal Normal Normal Normal  L. Extensor digitorum communis Normal None None None _______ Normal Normal Normal Normal
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
## 2017-09-16 ENCOUNTER — Telehealth: Payer: Self-pay | Admitting: *Deleted

## 2017-09-16 NOTE — Telephone Encounter (Addendum)
Lyrica PA approved by [facility] Tracks [id number]) through 09/11/2018.  ID#[id number].  Pt JD#[id number] S.  Pharmacy notified.

## 2017-09-18 DIAGNOSIS — I700 Atherosclerosis of aorta: Secondary | ICD-10-CM | POA: Insufficient documentation
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
## 2017-09-21 ENCOUNTER — Encounter: Payer: Self-pay | Admitting: Cardiovascular Disease

## 2017-09-21 ENCOUNTER — Other Ambulatory Visit: Payer: Self-pay | Admitting: Orthopedic Surgery

## 2017-09-21 ENCOUNTER — Ambulatory Visit (INDEPENDENT_AMBULATORY_CARE_PROVIDER_SITE_OTHER): Payer: Medicaid Other | Admitting: Cardiovascular Disease

## 2017-09-21 ENCOUNTER — Ambulatory Visit: Admission: RE | Admit: 2017-09-21 | Payer: Medicaid Other | Source: Ambulatory Visit | Admitting: Orthopedic Surgery

## 2017-09-21 VITALS — BP 106/68 | HR 71 | Ht 60.0 in | Wt 138.8 lb

## 2017-09-21 DIAGNOSIS — R079 Chest pain, unspecified: Secondary | ICD-10-CM | POA: Diagnosis not present

## 2017-09-21 DIAGNOSIS — R52 Pain, unspecified: Secondary | ICD-10-CM

## 2017-09-21 DIAGNOSIS — I10 Essential (primary) hypertension: Secondary | ICD-10-CM | POA: Diagnosis not present

## 2017-09-21 DIAGNOSIS — F17200 Nicotine dependence, unspecified, uncomplicated: Secondary | ICD-10-CM | POA: Insufficient documentation

## 2017-09-21 DIAGNOSIS — E782 Mixed hyperlipidemia: Secondary | ICD-10-CM | POA: Diagnosis not present

## 2017-09-21 DIAGNOSIS — I700 Atherosclerosis of aorta: Secondary | ICD-10-CM | POA: Diagnosis not present

## 2017-09-21 DIAGNOSIS — M25562 Pain in left knee: Secondary | ICD-10-CM

## 2017-09-21 DIAGNOSIS — M25561 Pain in right knee: Secondary | ICD-10-CM

## 2017-09-21 NOTE — Patient Instructions (Signed)
Medication Instructions:   No medication changes made  Labwork:  No new labs needed  Testing/Procedures:  No further testing at this time  We will talk with Dr. Mortimer Fries about scheduling the CT chest   Follow-Up: It was a pleasure seeing you in the office today. Please call us if you have new issues that need to be addressed before your next appt.  [phone]  Your physician wants you to follow-up in:  As needed  If you need a refill on your cardiac medications before your next appointment, please call your pharmacy.

## 2017-10-19 ENCOUNTER — Ambulatory Visit: Payer: Medicaid Other | Admitting: Cardiovascular Disease

## 2017-10-20 ENCOUNTER — Telehealth: Payer: Self-pay | Admitting: Internal Medicine

## 2017-10-20 NOTE — Telephone Encounter (Signed)
Patient has never heard back about ct scan   shee was seen in January a ct was ordered that she could not do b/c of an allergy to contrast   Patient still has pain and bruising in Left arm  Please call asap as patient is worried and has ot heard anything for a while

## 2017-10-20 NOTE — Telephone Encounter (Signed)
Please advise on message below.

## 2017-10-21 NOTE — Telephone Encounter (Signed)
Pt informed CT not needed. Pt verbalized understanding. Nothing further needed.

## 2017-10-21 NOTE — Telephone Encounter (Signed)
Patient needs to call doctor who ordered CT of her shoulder

## 2017-10-27 ENCOUNTER — Ambulatory Visit
Admission: RE | Admit: 2017-10-27 | Discharge: 2017-10-27 | Disposition: A | Discharge status: Home / Self Care | Payer: Medicaid Other | Source: Ambulatory Visit | Attending: Orthopedic Surgery | Admitting: Orthopedic Surgery

## 2017-10-27 DIAGNOSIS — M25562 Pain in left knee: Secondary | ICD-10-CM

## 2017-10-27 DIAGNOSIS — R52 Pain, unspecified: Secondary | ICD-10-CM

## 2017-10-27 DIAGNOSIS — M25462 Effusion, left knee: Secondary | ICD-10-CM | POA: Insufficient documentation

## 2017-10-27 DIAGNOSIS — M25561 Pain in right knee: Secondary | ICD-10-CM | POA: Diagnosis present

## 2017-10-27 DIAGNOSIS — M25551 Pain in right hip: Secondary | ICD-10-CM | POA: Diagnosis present

## 2017-10-27 DIAGNOSIS — M25461 Effusion, right knee: Secondary | ICD-10-CM | POA: Insufficient documentation

## 2017-11-26 ENCOUNTER — Other Ambulatory Visit: Payer: Self-pay | Admitting: Family Medicine

## 2017-12-08 DIAGNOSIS — M25562 Pain in left knee: Secondary | ICD-10-CM | POA: Insufficient documentation

## 2017-12-08 DIAGNOSIS — J449 Chronic obstructive pulmonary disease, unspecified: Secondary | ICD-10-CM | POA: Insufficient documentation

## 2017-12-08 DIAGNOSIS — M545 Low back pain: Secondary | ICD-10-CM | POA: Insufficient documentation

## 2017-12-08 DIAGNOSIS — Y999 Unspecified external cause status: Secondary | ICD-10-CM | POA: Insufficient documentation

## 2017-12-08 DIAGNOSIS — Y939 Activity, unspecified: Secondary | ICD-10-CM | POA: Insufficient documentation

## 2017-12-08 DIAGNOSIS — S0101XA Laceration without foreign body of scalp, initial encounter: Secondary | ICD-10-CM | POA: Diagnosis not present

## 2017-12-08 DIAGNOSIS — Y929 Unspecified place or not applicable: Secondary | ICD-10-CM | POA: Insufficient documentation

## 2017-12-08 DIAGNOSIS — Z79899 Other long term (current) drug therapy: Secondary | ICD-10-CM | POA: Diagnosis not present

## 2017-12-08 DIAGNOSIS — Z96612 Presence of left artificial shoulder joint: Secondary | ICD-10-CM | POA: Diagnosis not present

## 2017-12-08 DIAGNOSIS — E039 Hypothyroidism, unspecified: Secondary | ICD-10-CM | POA: Diagnosis not present

## 2017-12-08 DIAGNOSIS — F17210 Nicotine dependence, cigarettes, uncomplicated: Secondary | ICD-10-CM | POA: Diagnosis not present

## 2017-12-08 DIAGNOSIS — S0990XA Unspecified injury of head, initial encounter: Secondary | ICD-10-CM | POA: Diagnosis present

## 2017-12-08 NOTE — ED Notes (Signed)
Patient reports she took xanax 3 hours ago.
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
## 2017-12-09 ENCOUNTER — Emergency Department
Admission: EM | Admit: 2017-12-09 | Discharge: 2017-12-09 | Disposition: A | Discharge status: Home / Self Care | Payer: Medicaid Other | Attending: Emergency Medicine | Admitting: Emergency Medicine

## 2017-12-09 DIAGNOSIS — S0101XA Laceration without foreign body of scalp, initial encounter: Secondary | ICD-10-CM

## 2017-12-09 MED ORDER — ONDANSETRON 4 MG PO TBDP
ORAL_TABLET | ORAL | Status: AC
  Filled 2017-12-09: qty 1

## 2017-12-09 MED ORDER — LIDOCAINE HCL (PF) 1 % IJ SOLN
INTRAMUSCULAR | Status: AC
  Filled 2017-12-09: qty 5

## 2017-12-09 MED ORDER — TETANUS-DIPHTH-ACELL PERTUSSIS 5-2.5-18.5 LF-MCG/0.5 IM SUSP
INTRAMUSCULAR | Status: AC
  Filled 2017-12-09: qty 0.5

## 2017-12-09 MED ORDER — OXYCODONE HCL 5 MG PO TABS
ORAL_TABLET | ORAL | Status: AC
  Filled 2017-12-09: qty 2

## 2017-12-09 NOTE — ED Notes (Signed)
Patient was d/c at 0255 but d/c from system at [phone]
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
## 2017-12-19 DIAGNOSIS — M1712 Unilateral primary osteoarthritis, left knee: Secondary | ICD-10-CM | POA: Insufficient documentation

## 2018-02-11 ENCOUNTER — Encounter
Admission: RE | Admit: 2018-02-11 | Discharge: 2018-02-11 | Disposition: A | Discharge status: Home / Self Care | Payer: Medicaid Other | Source: Ambulatory Visit | Attending: Orthopedic Surgery | Admitting: Orthopedic Surgery

## 2018-02-11 ENCOUNTER — Other Ambulatory Visit: Payer: Self-pay

## 2018-02-11 DIAGNOSIS — Z01812 Encounter for preprocedural laboratory examination: Secondary | ICD-10-CM | POA: Diagnosis not present

## 2018-02-11 HISTORY — DX: Bipolar disorder, unspecified: F31.9

## 2018-02-11 LAB — COMPREHENSIVE METABOLIC PANEL
ALK PHOS: 71 U/L (ref 38–126)
ALT: 17 U/L (ref 14–54)
ANION GAP: 11 (ref 5–15)
AST: 25 U/L (ref 15–41)
Albumin: 4.5 g/dL (ref 3.5–5.0)
BILIRUBIN TOTAL: 0.5 mg/dL (ref 0.3–1.2)
BUN: 16 mg/dL (ref 6–20)
CO2: 24 mmol/L (ref 22–32)
CREATININE: 0.91 mg/dL (ref 0.44–1.00)
Calcium: 9.9 mg/dL (ref 8.9–10.3)
Chloride: 103 mmol/L (ref 101–111)
Glucose, Bld: 79 mg/dL (ref 65–99)
Potassium: 3.9 mmol/L (ref 3.5–5.1)
Sodium: 138 mmol/L (ref 135–145)
Total Protein: 8.4 g/dL — ABNORMAL HIGH (ref 6.5–8.1)

## 2018-02-11 LAB — URINALYSIS, ROUTINE W REFLEX MICROSCOPIC
Bilirubin Urine: NEGATIVE
GLUCOSE, UA: NEGATIVE mg/dL
HGB URINE DIPSTICK: NEGATIVE
Ketones, ur: NEGATIVE mg/dL
Leukocytes, UA: NEGATIVE
Nitrite: NEGATIVE
PROTEIN: NEGATIVE mg/dL
Specific Gravity, Urine: 1.018 (ref 1.005–1.030)
pH: 6 (ref 5.0–8.0)

## 2018-02-11 LAB — C-REACTIVE PROTEIN

## 2018-02-11 LAB — PROTIME-INR
INR: 0.91
PROTHROMBIN TIME: 12.2 s (ref 11.4–15.2)

## 2018-02-11 LAB — CBC
HEMATOCRIT: 45.4 % (ref 35.0–47.0)
HEMOGLOBIN: 15.3 g/dL (ref 12.0–16.0)
MCH: 30.9 pg (ref 26.0–34.0)
MCHC: 33.7 g/dL (ref 32.0–36.0)
MCV: 91.7 fL (ref 80.0–100.0)
Platelets: 361 10*3/uL (ref 150–440)
RBC: 4.95 MIL/uL (ref 3.80–5.20)
RDW: 15.6 % — ABNORMAL HIGH (ref 11.5–14.5)
WBC: 9.2 10*3/uL (ref 3.6–11.0)

## 2018-02-11 LAB — TYPE AND SCREEN
ABO/RH(D): O POS
Antibody Screen: NEGATIVE

## 2018-02-11 LAB — HEMOGLOBIN A1C
HEMOGLOBIN A1C: 6.2 % — AB (ref 4.8–5.6)
MEAN PLASMA GLUCOSE: 131.24 mg/dL

## 2018-02-11 LAB — SURGICAL PCR SCREEN
MRSA, PCR: NEGATIVE
Staphylococcus aureus: NEGATIVE

## 2018-02-11 LAB — APTT: aPTT: 33 seconds (ref 24–36)

## 2018-02-11 LAB — SEDIMENTATION RATE: Sed Rate: 16 mm/hr (ref 0–30)

## 2018-02-11 NOTE — Pre-Procedure Instructions (Signed)
PT INSTRUCTED TO BRING MEDICATION TO PAT- PT SAID SHE WILL BRING MEDICATION ON Tuesday JUNE 18 THEN WE CAN GIVE HER THE FINAL INSTRUCTIONS FOR SURGERY

## 2018-02-11 NOTE — Patient Instructions (Signed)
Your procedure is scheduled on: February 23, 2018 wednesday Report to Day Surgery on the 2nd floor of the [facility]. To find out your arrival time, please call [phone] between 1PM - 3PM on: February 22, 2018 tuesday  REMEMBER: Instructions that are not followed completely may result in serious medical risk, up to and including death; or upon the discretion of your surgeon and anesthesiologist your surgery may need to be rescheduled.  Do not eat food after midnight the night before your procedure.  No gum chewing, lozengers or hard candies.  You may however, drink CLEAR liquids up to 2 hours before you are scheduled to arrive for your surgery. Do not drink anything within 2 hours of the start of your surgery.  Clear liquids include: - water  - apple juice without pulp - clear gatorade - black coffee or tea (Do NOT add anything to the coffee or tea) Do NOT drink anything that is not on this list.  Type 1 and Type 2 diabetics should only drink water.  No Alcohol for 24 hours before or after surgery.  No Smoking including e-cigarettes for 24 hours prior to surgery.  No chewable tobacco products for at least 6 hours prior to surgery.  No nicotine patches on the day of surgery.  On the morning of surgery brush your teeth with toothpaste and water, you may rinse your mouth with mouthwash if you wish. Do not swallow any toothpaste or mouthwash.  Notify your doctor if there is any change in your medical condition (cold, fever, infection).  Do not wear jewelry, make-up, hairpins, clips or nail polish.  Do not wear lotions, powders, or perfumes. You may wear deodorant.  Do not shave 48 hours prior to surgery. Men may shave face and neck.  Contacts and dentures may not be worn into surgery.  Do not bring valuables to the hospital, including drivers license, insurance or credit cards.  [facility] is not responsible for any belongings or valuables.   TAKE THESE MEDICATIONS THE MORNING  OF SURGERY:  Use CHG Soap or wipes as directed on instruction sheet.  Use inhalers on the day of surgery   Follow recommendations from Cardiologist, Pulmonologist or PCP regarding stopping Aspirin, Coumadin, Plavix, Eliquis, Pradaxa, or Pletal.  DO NOT TAKE BC POWDERS, EXCEDRIN, AND GOODYS POWDER  OR ASPIRIN PRODUCTS  Stop ANY OVER THE COUNTER supplements until after surgery. (May continue Vitamin D, Vitamin B, and multivitamin.)  Wear comfortable clothing (specific to your surgery type) to the hospital.  Plan for stool softeners for home use.  If you are being admitted to the hospital overnight, leave your suitcase in the car. After surgery it may be brought to your room.  If you are being discharged the day of surgery, you will not be allowed to drive home. You will need a responsible adult to drive you home and stay with you that night.   If you are taking public transportation, you will need to have a responsible adult with you. Please confirm with your physician that it is acceptable to use public transportation.   Please call [phone] if you have any questions about these instructions.

## 2018-02-12 NOTE — Pre-Procedure Instructions (Signed)
A1C results sent to Dr. Marry Guan for review.

## 2018-02-13 LAB — URINE CULTURE
Culture: <10000 — AB
Special Requests: NORMAL

## 2018-02-15 NOTE — Patient Instructions (Signed)
Your procedure is scheduled on: Wed. 02/23/18 Report to Day Surgery. To find out your arrival time please call [phone] between 1PM - 3PM on Tues 02/22/18.  Remember: Instructions that are not followed completely may result in serious medical risk, up to and including death, or upon the discretion of your surgeon and anesthesiologist your surgery may need to be rescheduled.     _X__ 1. Do not eat food after midnight the night before your procedure.                 No gum chewing or hard candies. You may drink clear liquids up to 2 hours                 before you are scheduled to arrive for your surgery- DO not drink clear                 liquids within 2 hours of the start of your surgery.                 Clear Liquids include:  water, apple juice without pulp, clear carbohydrate                 drink such as Clearfast of Gartorade, Black Coffee or Tea (Do not add                 anything to coffee or tea).  __X__2.  On the morning of surgery brush your teeth with toothpaste and water, you  may rinse your mouth with mouthwash if you wish.  Do not swallow any              toothpaste of mouthwash.     _X__ 3.  No Alcohol for 24 hours before or after surgery.   _X__ 4.  Do Not Smoke or use e-cigarettes For 24 Hours Prior to Your Surgery.                 Do not use any chewable tobacco products for at least 6 hours prior to                 surgery.  ____  5.  Bring all medications with you on the day of surgery if instructed.   __x__  6.  Notify your doctor if there is any change in your medical condition      (cold, fever, infections).     Do not wear jewelry, make-up, hairpins, clips or nail polish. Do not wear lotions, powders, or perfumes. You may wear deodorant. Do not shave 48 hours prior to surgery. Men may shave face and neck. Do not bring valuables to the hospital.    [facility] is not responsible for any belongings or valuables.  Contacts, dentures  or bridgework may not be worn into surgery. Leave your suitcase in the car. After surgery it may be brought to your room. For patients admitted to the hospital, discharge time is determined by your treatment team.   Patients discharged the day of surgery will not be allowed to drive home.   Please read over the following fact sheets that you were given:     _x___ Take these medicines the morning of surgery with A SIP OF WATER:    1.ALPRAZolam (XANAX) 1 MG tablet  2. DULoxetine (CYMBALTA) 60 MG capsule  3. LYRICA 100 MG capsule  4.  5.  6.  ____ Fleet Enema (as directed)   ___x_ Use CHG Soap as directed  __x__ Use inhalers on the day of surgery albuterol (PROVENTIL HFA;VENTOLIN HFA) 108 (90 Base) MCG/ACT inhaler and bring to hospitalmometasone-formoterol (DULERA) 200-5 MCG/ACT AERO, ____ Stop metformin 2 days prior to surgery    ____ Take 1/2 of usual insulin dose the night before surgery. No insulin the morning          of surgery.   __x__ Stop aspirin ( BC)until surgery  ____ Stop Anti-inflammatories on    ____ Stop supplements until after surgery.    ____ Bring C-Pap to the hospital.

## 2018-02-23 ENCOUNTER — Inpatient Hospital Stay: Payer: Medicaid Other

## 2018-02-23 ENCOUNTER — Encounter
Admission: RE | Disposition: A | Discharge status: Home Health | Payer: Self-pay | Source: Home / Self Care | Attending: Orthopedic Surgery

## 2018-02-23 ENCOUNTER — Other Ambulatory Visit: Payer: Self-pay

## 2018-02-23 ENCOUNTER — Inpatient Hospital Stay
Admission: RE | Admit: 2018-02-23 | Discharge: 2018-02-25 | DRG: 470 | Disposition: A | Discharge status: Home Health | Payer: Medicaid Other | Attending: Orthopedic Surgery | Admitting: Orthopedic Surgery

## 2018-02-23 ENCOUNTER — Inpatient Hospital Stay: Payer: Medicaid Other | Admitting: Anesthesiology

## 2018-02-23 ENCOUNTER — Encounter: Payer: Self-pay | Admitting: Orthopedic Surgery

## 2018-02-23 DIAGNOSIS — E785 Hyperlipidemia, unspecified: Secondary | ICD-10-CM | POA: Diagnosis present

## 2018-02-23 DIAGNOSIS — Z8614 Personal history of Methicillin resistant Staphylococcus aureus infection: Secondary | ICD-10-CM | POA: Diagnosis not present

## 2018-02-23 DIAGNOSIS — Z91048 Other nonmedicinal substance allergy status: Secondary | ICD-10-CM

## 2018-02-23 DIAGNOSIS — E039 Hypothyroidism, unspecified: Secondary | ICD-10-CM | POA: Diagnosis present

## 2018-02-23 DIAGNOSIS — Z882 Allergy status to sulfonamides status: Secondary | ICD-10-CM

## 2018-02-23 DIAGNOSIS — F319 Bipolar disorder, unspecified: Secondary | ICD-10-CM | POA: Diagnosis present

## 2018-02-23 DIAGNOSIS — Z885 Allergy status to narcotic agent status: Secondary | ICD-10-CM

## 2018-02-23 DIAGNOSIS — Z96651 Presence of right artificial knee joint: Secondary | ICD-10-CM | POA: Diagnosis present

## 2018-02-23 DIAGNOSIS — Z91041 Radiographic dye allergy status: Secondary | ICD-10-CM

## 2018-02-23 DIAGNOSIS — G894 Chronic pain syndrome: Secondary | ICD-10-CM | POA: Diagnosis present

## 2018-02-23 DIAGNOSIS — Z79899 Other long term (current) drug therapy: Secondary | ICD-10-CM

## 2018-02-23 DIAGNOSIS — Z91030 Bee allergy status: Secondary | ICD-10-CM | POA: Diagnosis not present

## 2018-02-23 DIAGNOSIS — Z888 Allergy status to other drugs, medicaments and biological substances status: Secondary | ICD-10-CM

## 2018-02-23 DIAGNOSIS — Z23 Encounter for immunization: Secondary | ICD-10-CM

## 2018-02-23 DIAGNOSIS — F17210 Nicotine dependence, cigarettes, uncomplicated: Secondary | ICD-10-CM | POA: Diagnosis present

## 2018-02-23 DIAGNOSIS — M25562 Pain in left knee: Secondary | ICD-10-CM | POA: Diagnosis present

## 2018-02-23 DIAGNOSIS — Z880 Allergy status to penicillin: Secondary | ICD-10-CM | POA: Diagnosis not present

## 2018-02-23 DIAGNOSIS — I739 Peripheral vascular disease, unspecified: Secondary | ICD-10-CM | POA: Diagnosis present

## 2018-02-23 DIAGNOSIS — M1712 Unilateral primary osteoarthritis, left knee: Secondary | ICD-10-CM | POA: Diagnosis present

## 2018-02-23 DIAGNOSIS — I959 Hypotension, unspecified: Secondary | ICD-10-CM | POA: Diagnosis not present

## 2018-02-23 DIAGNOSIS — J449 Chronic obstructive pulmonary disease, unspecified: Secondary | ICD-10-CM | POA: Diagnosis present

## 2018-02-23 DIAGNOSIS — Z96659 Presence of unspecified artificial knee joint: Secondary | ICD-10-CM

## 2018-02-23 DIAGNOSIS — Z96612 Presence of left artificial shoulder joint: Secondary | ICD-10-CM | POA: Diagnosis present

## 2018-02-23 DIAGNOSIS — F419 Anxiety disorder, unspecified: Secondary | ICD-10-CM | POA: Diagnosis present

## 2018-02-23 HISTORY — PX: KNEE ARTHROPLASTY: SHX992

## 2018-02-23 LAB — URINE DRUG SCREEN, QUALITATIVE (ARMC ONLY)
Amphetamines, Ur Screen: NOT DETECTED
Benzodiazepine, Ur Scrn: POSITIVE — AB
Cannabinoid 50 Ng, Ur ~~LOC~~: POSITIVE — AB
Cocaine Metabolite,Ur ~~LOC~~: NOT DETECTED
MDMA (Ecstasy)Ur Screen: NOT DETECTED
Methadone Scn, Ur: NOT DETECTED
Opiate, Ur Screen: NOT DETECTED
Phencyclidine (PCP) Ur S: NOT DETECTED
Tricyclic, Ur Screen: POSITIVE — AB

## 2018-02-23 LAB — ABO/RH: ABO/RH(D): O POS

## 2018-02-23 SURGERY — ARTHROPLASTY, KNEE, TOTAL, USING IMAGELESS COMPUTER-ASSISTED NAVIGATION
Anesthesia: General | Laterality: Left

## 2018-02-23 MED ORDER — ACETAMINOPHEN 10 MG/ML IV SOLN
1000.0000 mg | Freq: Four times a day (QID) | INTRAVENOUS | Status: AC
  Administered 2018-02-23 – 2018-02-24 (×3): 1000 mg via INTRAVENOUS
  Filled 2018-02-23 (×4): qty 100

## 2018-02-23 MED ORDER — FAMOTIDINE 20 MG PO TABS
20.0000 mg | ORAL_TABLET | Freq: Once | ORAL | Status: DC
  Administered 2018-02-23: 20 mg via ORAL

## 2018-02-23 MED ORDER — GLYCOPYRROLATE 0.2 MG/ML IJ SOLN
INTRAMUSCULAR | Status: AC
  Filled 2018-02-23: qty 1

## 2018-02-23 MED ORDER — ALBUTEROL SULFATE (2.5 MG/3ML) 0.083% IN NEBU: 3.0000 mL | INHALATION_SOLUTION | Freq: Four times a day (QID) | RESPIRATORY_TRACT | Status: DC | PRN

## 2018-02-23 MED ORDER — REMIFENTANIL HCL 1 MG IV SOLR
INTRAVENOUS | Status: DC | PRN
  Administered 2018-02-23: .01 ug/kg/min via INTRAVENOUS

## 2018-02-23 MED ORDER — MEPERIDINE HCL 50 MG/ML IJ SOLN
25.0000 mg | Freq: Once | INTRAMUSCULAR | Status: AC
  Administered 2018-02-23 (×2): 25 mg via INTRAVENOUS

## 2018-02-23 MED ORDER — CHLORHEXIDINE GLUCONATE 4 % EX LIQD: 60.0000 mL | Freq: Once | CUTANEOUS | Status: DC

## 2018-02-23 MED ORDER — ALUM & MAG HYDROXIDE-SIMETH 200-200-20 MG/5ML PO SUSP: 30.0000 mL | ORAL | Status: DC | PRN

## 2018-02-23 MED ORDER — LACTATED RINGERS IV SOLN
INTRAVENOUS | Status: DC | PRN
  Administered 2018-02-23 (×2): via INTRAVENOUS

## 2018-02-23 MED ORDER — MAGNESIUM HYDROXIDE 400 MG/5ML PO SUSP
30.0000 mL | Freq: Every day | ORAL | Status: DC
  Administered 2018-02-23 – 2018-02-25 (×3): 30 mL via ORAL
  Filled 2018-02-23 (×4): qty 30

## 2018-02-23 MED ORDER — CLINDAMYCIN PHOSPHATE 900 MG/50ML IV SOLN: 900.0000 mg | INTRAVENOUS | Status: DC

## 2018-02-23 MED ORDER — ENOXAPARIN SODIUM 30 MG/0.3ML ~~LOC~~ SOLN
30.0000 mg | Freq: Two times a day (BID) | SUBCUTANEOUS | Status: DC
  Administered 2018-02-24 – 2018-02-25 (×3): 30 mg via SUBCUTANEOUS
  Filled 2018-02-23 (×3): qty 0.3

## 2018-02-23 MED ORDER — SUVOREXANT 20 MG PO TABS: 20.0000 mg | ORAL_TABLET | Freq: Every day | ORAL | Status: DC

## 2018-02-23 MED ORDER — SODIUM CHLORIDE 0.9 % IJ SOLN
INTRAMUSCULAR | Status: DC | PRN
  Administered 2018-02-23: 40 mL via INTRAVENOUS

## 2018-02-23 MED ORDER — MENTHOL 3 MG MT LOZG
1.0000 | LOZENGE | OROMUCOSAL | Status: DC | PRN
  Filled 2018-02-23: qty 9

## 2018-02-23 MED ORDER — ONDANSETRON HCL 4 MG/2ML IJ SOLN: 4.0000 mg | Freq: Four times a day (QID) | INTRAMUSCULAR | Status: DC | PRN

## 2018-02-23 MED ORDER — ALPRAZOLAM 1 MG PO TABS
1.0000 mg | ORAL_TABLET | Freq: Four times a day (QID) | ORAL | Status: DC
  Administered 2018-02-23 – 2018-02-24 (×6): 1 mg via ORAL
  Filled 2018-02-23 (×6): qty 1

## 2018-02-23 MED ORDER — FLEET ENEMA 7-19 GM/118ML RE ENEM: 1.0000 | ENEMA | Freq: Once | RECTAL | Status: DC | PRN

## 2018-02-23 MED ORDER — PNEUMOCOCCAL VAC POLYVALENT 25 MCG/0.5ML IJ INJ
0.5000 mL | INJECTION | INTRAMUSCULAR | Status: AC
  Administered 2018-02-25: 0.5 mL via INTRAMUSCULAR
  Filled 2018-02-23: qty 0.5

## 2018-02-23 MED ORDER — PROPOFOL 10 MG/ML IV BOLUS
INTRAVENOUS | Status: DC | PRN
  Administered 2018-02-23: 150 mg via INTRAVENOUS

## 2018-02-23 MED ORDER — HYDROMORPHONE HCL 1 MG/ML IJ SOLN
INTRAMUSCULAR | Status: AC
  Administered 2018-02-23: 0.25 mg via INTRAVENOUS
  Filled 2018-02-23: qty 1

## 2018-02-23 MED ORDER — PREGABALIN 50 MG PO CAPS
100.0000 mg | ORAL_CAPSULE | Freq: Three times a day (TID) | ORAL | Status: DC
  Administered 2018-02-23 – 2018-02-25 (×5): 100 mg via ORAL
  Filled 2018-02-23 (×5): qty 2

## 2018-02-23 MED ORDER — HYDROMORPHONE HCL 1 MG/ML IJ SOLN
INTRAMUSCULAR | Status: AC
  Filled 2018-02-23: qty 1

## 2018-02-23 MED ORDER — PHENYLEPHRINE HCL 10 MG/ML IJ SOLN
INTRAMUSCULAR | Status: DC | PRN
  Administered 2018-02-23: 100 ug via INTRAVENOUS
  Administered 2018-02-23: 50 ug via INTRAVENOUS
  Administered 2018-02-23 (×2): 100 ug via INTRAVENOUS
  Administered 2018-02-23: 150 ug via INTRAVENOUS

## 2018-02-23 MED ORDER — BUPIVACAINE LIPOSOME 1.3 % IJ SUSP
INTRAMUSCULAR | Status: AC
  Filled 2018-02-23: qty 20

## 2018-02-23 MED ORDER — MEPERIDINE HCL 25 MG/ML IJ SOLN: 25.0000 mg | Freq: Once | INTRAMUSCULAR | Status: DC

## 2018-02-23 MED ORDER — HYDROMORPHONE HCL 1 MG/ML IJ SOLN
0.5000 mg | INTRAMUSCULAR | Status: DC | PRN
  Administered 2018-02-23: 1 mg via INTRAVENOUS
  Filled 2018-02-23: qty 1

## 2018-02-23 MED ORDER — DEXAMETHASONE SODIUM PHOSPHATE 10 MG/ML IJ SOLN
8.0000 mg | Freq: Once | INTRAMUSCULAR | Status: DC
  Administered 2018-02-23: 8 mg via INTRAVENOUS

## 2018-02-23 MED ORDER — SUGAMMADEX SODIUM 200 MG/2ML IV SOLN
INTRAVENOUS | Status: DC | PRN
  Administered 2018-02-23: 200 mg via INTRAVENOUS

## 2018-02-23 MED ORDER — CLINDAMYCIN PHOSPHATE 900 MG/50ML IV SOLN
INTRAVENOUS | Status: DC | PRN
  Administered 2018-02-23: 900 mg via INTRAVENOUS

## 2018-02-23 MED ORDER — SODIUM CHLORIDE 0.9 % IV SOLN
INTRAVENOUS | Status: DC
  Administered 2018-02-23 – 2018-02-24 (×3): via INTRAVENOUS

## 2018-02-23 MED ORDER — OXYCODONE HCL 5 MG PO TABS
10.0000 mg | ORAL_TABLET | ORAL | Status: DC | PRN
  Administered 2018-02-23 – 2018-02-25 (×6): 10 mg via ORAL
  Filled 2018-02-23 (×6): qty 2

## 2018-02-23 MED ORDER — SODIUM CHLORIDE 0.9 % IV SOLN
INTRAVENOUS | Status: DC | PRN
  Administered 2018-02-23: 60 mL

## 2018-02-23 MED ORDER — ONDANSETRON HCL 4 MG/2ML IJ SOLN
INTRAMUSCULAR | Status: AC
  Filled 2018-02-23: qty 2

## 2018-02-23 MED ORDER — SODIUM CHLORIDE FLUSH 0.9 % IV SOLN
INTRAVENOUS | Status: AC
  Filled 2018-02-23: qty 3

## 2018-02-23 MED ORDER — CLINDAMYCIN PHOSPHATE 600 MG/50ML IV SOLN
600.0000 mg | Freq: Four times a day (QID) | INTRAVENOUS | Status: AC
  Administered 2018-02-23 – 2018-02-24 (×4): 600 mg via INTRAVENOUS
  Filled 2018-02-23 (×4): qty 50

## 2018-02-23 MED ORDER — LIDOCAINE HCL (PF) 2 % IJ SOLN
INTRAMUSCULAR | Status: AC
  Filled 2018-02-23: qty 10

## 2018-02-23 MED ORDER — METOCLOPRAMIDE HCL 5 MG/ML IJ SOLN: 5.0000 mg | Freq: Three times a day (TID) | INTRAMUSCULAR | Status: DC | PRN

## 2018-02-23 MED ORDER — PROPOFOL 10 MG/ML IV BOLUS
INTRAVENOUS | Status: AC
  Filled 2018-02-23: qty 20

## 2018-02-23 MED ORDER — ROCURONIUM BROMIDE 50 MG/5ML IV SOLN
INTRAVENOUS | Status: AC
  Filled 2018-02-23: qty 2

## 2018-02-23 MED ORDER — OXYCODONE HCL 5 MG PO TABS
5.0000 mg | ORAL_TABLET | ORAL | Status: DC | PRN
  Administered 2018-02-25 (×2): 5 mg via ORAL
  Filled 2018-02-23 (×2): qty 1

## 2018-02-23 MED ORDER — CELECOXIB 200 MG PO CAPS
400.0000 mg | ORAL_CAPSULE | Freq: Once | ORAL | Status: AC
  Administered 2018-02-23: 400 mg via ORAL

## 2018-02-23 MED ORDER — TRANEXAMIC ACID 1000 MG/10ML IV SOLN
1000.0000 mg | INTRAVENOUS | Status: DC
  Filled 2018-02-23: qty 10

## 2018-02-23 MED ORDER — LACTATED RINGERS IV SOLN
INTRAVENOUS | Status: DC
  Administered 2018-02-23: 10:00:00 via INTRAVENOUS

## 2018-02-23 MED ORDER — PHENOL 1.4 % MT LIQD
1.0000 | OROMUCOSAL | Status: DC | PRN
  Filled 2018-02-23: qty 177

## 2018-02-23 MED ORDER — PROPOFOL 10 MG/ML IV BOLUS
INTRAVENOUS | Status: AC
  Filled 2018-02-23: qty 40

## 2018-02-23 MED ORDER — ONDANSETRON HCL 4 MG/2ML IJ SOLN: 4.0000 mg | Freq: Once | INTRAMUSCULAR | Status: DC | PRN

## 2018-02-23 MED ORDER — ONDANSETRON HCL 4 MG PO TABS
4.0000 mg | ORAL_TABLET | Freq: Four times a day (QID) | ORAL | Status: DC | PRN
Start: 2018-02-23 — End: 2018-02-25

## 2018-02-23 MED ORDER — DULOXETINE HCL 60 MG PO CPEP
60.0000 mg | ORAL_CAPSULE | Freq: Every day | ORAL | Status: DC
  Administered 2018-02-23 – 2018-02-24 (×2): 60 mg via ORAL
  Filled 2018-02-23 (×3): qty 1

## 2018-02-23 MED ORDER — MEPERIDINE HCL 50 MG/ML IJ SOLN
INTRAMUSCULAR | Status: AC
  Administered 2018-02-23: 25 mg via INTRAVENOUS
  Filled 2018-02-23: qty 1

## 2018-02-23 MED ORDER — NEOMYCIN-POLYMYXIN B GU 40-200000 IR SOLN
Status: AC
  Filled 2018-02-23: qty 20

## 2018-02-23 MED ORDER — DIPHENHYDRAMINE HCL 12.5 MG/5ML PO ELIX
12.5000 mg | ORAL_SOLUTION | ORAL | Status: DC | PRN
  Administered 2018-02-23: 25 mg via ORAL
  Filled 2018-02-23: qty 10

## 2018-02-23 MED ORDER — ACETAMINOPHEN 325 MG PO TABS: 325.0000 mg | ORAL_TABLET | Freq: Four times a day (QID) | ORAL | Status: DC | PRN

## 2018-02-23 MED ORDER — BUPIVACAINE HCL (PF) 0.25 % IJ SOLN
INTRAMUSCULAR | Status: DC | PRN
  Administered 2018-02-23: 60 mL

## 2018-02-23 MED ORDER — TRANEXAMIC ACID 1000 MG/10ML IV SOLN
1000.0000 mg | Freq: Once | INTRAVENOUS | Status: AC
  Administered 2018-02-23: 1000 mg via INTRAVENOUS
  Filled 2018-02-23: qty 1100

## 2018-02-23 MED ORDER — ROCURONIUM BROMIDE 100 MG/10ML IV SOLN
INTRAVENOUS | Status: DC | PRN
  Administered 2018-02-23: 100 mg via INTRAVENOUS

## 2018-02-23 MED ORDER — BISACODYL 10 MG RE SUPP: 10.0000 mg | Freq: Every day | RECTAL | Status: DC | PRN

## 2018-02-23 MED ORDER — LIDOCAINE HCL (CARDIAC) PF 100 MG/5ML IV SOSY
PREFILLED_SYRINGE | INTRAVENOUS | Status: DC | PRN
  Administered 2018-02-23: 100 mg via INTRAVENOUS

## 2018-02-23 MED ORDER — SENNOSIDES-DOCUSATE SODIUM 8.6-50 MG PO TABS
1.0000 | ORAL_TABLET | Freq: Two times a day (BID) | ORAL | Status: DC
  Administered 2018-02-23 – 2018-02-25 (×4): 1 via ORAL
  Filled 2018-02-23 (×4): qty 1

## 2018-02-23 MED ORDER — HYDROXYZINE HCL 25 MG PO TABS
25.0000 mg | ORAL_TABLET | Freq: Four times a day (QID) | ORAL | Status: DC | PRN
  Filled 2018-02-23: qty 1

## 2018-02-23 MED ORDER — LABETALOL HCL 5 MG/ML IV SOLN
INTRAVENOUS | Status: DC | PRN
  Administered 2018-02-23: 10 mg via INTRAVENOUS
  Administered 2018-02-23: 5 mg via INTRAVENOUS

## 2018-02-23 MED ORDER — SODIUM CHLORIDE 0.9 % IV SOLN
INTRAVENOUS | Status: DC | PRN
  Administered 2018-02-23: 1000 mg via INTRAVENOUS

## 2018-02-23 MED ORDER — IPRATROPIUM-ALBUTEROL 0.5-2.5 (3) MG/3ML IN SOLN
3.0000 mL | Freq: Once | RESPIRATORY_TRACT | Status: DC
Start: 2018-02-23 — End: 2018-02-23

## 2018-02-23 MED ORDER — EPHEDRINE SULFATE 50 MG/ML IJ SOLN
INTRAMUSCULAR | Status: AC
  Filled 2018-02-23: qty 1

## 2018-02-23 MED ORDER — QUETIAPINE FUMARATE 300 MG PO TABS
800.0000 mg | ORAL_TABLET | Freq: Every day | ORAL | Status: DC
  Administered 2018-02-23 – 2018-02-24 (×2): 800 mg via ORAL
  Filled 2018-02-23 (×3): qty 1

## 2018-02-23 MED ORDER — CELECOXIB 200 MG PO CAPS
200.0000 mg | ORAL_CAPSULE | Freq: Two times a day (BID) | ORAL | Status: DC
  Administered 2018-02-24 – 2018-02-25 (×3): 200 mg via ORAL
  Filled 2018-02-23 (×3): qty 1

## 2018-02-23 MED ORDER — FENTANYL CITRATE (PF) 100 MCG/2ML IJ SOLN
INTRAMUSCULAR | Status: AC
  Filled 2018-02-23: qty 2

## 2018-02-23 MED ORDER — HYDROMORPHONE HCL 1 MG/ML IJ SOLN
0.2500 mg | INTRAMUSCULAR | Status: DC | PRN
  Administered 2018-02-23 (×5): 0.25 mg via INTRAVENOUS

## 2018-02-23 MED ORDER — REMIFENTANIL HCL 1 MG IV SOLR
INTRAVENOUS | Status: AC
  Filled 2018-02-23: qty 1000

## 2018-02-23 MED ORDER — METOCLOPRAMIDE HCL 10 MG PO TABS
10.0000 mg | ORAL_TABLET | Freq: Three times a day (TID) | ORAL | Status: DC
  Administered 2018-02-23 – 2018-02-25 (×4): 10 mg via ORAL
  Filled 2018-02-23 (×4): qty 1

## 2018-02-23 MED ORDER — PREGABALIN 100 MG PO CAPS
100.0000 mg | ORAL_CAPSULE | Freq: Once | ORAL | Status: AC
  Administered 2018-02-23: 100 mg via ORAL
  Filled 2018-02-23: qty 1

## 2018-02-23 MED ORDER — AMPHETAMINE-DEXTROAMPHETAMINE 5 MG PO TABS
20.0000 mg | ORAL_TABLET | Freq: Two times a day (BID) | ORAL | Status: DC
  Administered 2018-02-23 – 2018-02-25 (×4): 20 mg via ORAL
  Filled 2018-02-23 (×4): qty 4

## 2018-02-23 MED ORDER — SODIUM CHLORIDE FLUSH 0.9 % IV SOLN
INTRAVENOUS | Status: AC
  Filled 2018-02-23: qty 40

## 2018-02-23 MED ORDER — FERROUS SULFATE 325 (65 FE) MG PO TABS
325.0000 mg | ORAL_TABLET | Freq: Two times a day (BID) | ORAL | Status: DC
  Administered 2018-02-23 – 2018-02-25 (×4): 325 mg via ORAL
  Filled 2018-02-23 (×4): qty 1

## 2018-02-23 MED ORDER — METOCLOPRAMIDE HCL 10 MG PO TABS: 5.0000 mg | ORAL_TABLET | Freq: Three times a day (TID) | ORAL | Status: DC | PRN

## 2018-02-23 MED ORDER — ADULT MULTIVITAMIN W/MINERALS CH
1.0000 | ORAL_TABLET | Freq: Every day | ORAL | Status: DC
  Administered 2018-02-24 – 2018-02-25 (×2): 1 via ORAL
  Filled 2018-02-23 (×2): qty 1

## 2018-02-23 MED ORDER — MOMETASONE FURO-FORMOTEROL FUM 200-5 MCG/ACT IN AERO
2.0000 | INHALATION_SPRAY | Freq: Two times a day (BID) | RESPIRATORY_TRACT | Status: DC
  Administered 2018-02-23 – 2018-02-25 (×4): 2 via RESPIRATORY_TRACT
  Filled 2018-02-23: qty 8.8

## 2018-02-23 MED ORDER — ACETAMINOPHEN 10 MG/ML IV SOLN
INTRAVENOUS | Status: DC | PRN
  Administered 2018-02-23: 1000 mg via INTRAVENOUS

## 2018-02-23 MED ORDER — NEOMYCIN-POLYMYXIN B GU 40-200000 IR SOLN
Status: DC | PRN
  Administered 2018-02-23: 14 mL

## 2018-02-23 MED ORDER — IPRATROPIUM-ALBUTEROL 0.5-2.5 (3) MG/3ML IN SOLN
RESPIRATORY_TRACT | Status: AC
  Filled 2018-02-23: qty 3

## 2018-02-23 MED ORDER — MIDAZOLAM HCL 2 MG/2ML IJ SOLN
INTRAMUSCULAR | Status: DC | PRN
  Administered 2018-02-23: 2 mg via INTRAVENOUS

## 2018-02-23 MED ORDER — BUPIVACAINE HCL (PF) 0.25 % IJ SOLN
INTRAMUSCULAR | Status: AC
  Filled 2018-02-23: qty 60

## 2018-02-23 MED ORDER — ONDANSETRON HCL 4 MG/2ML IJ SOLN
INTRAMUSCULAR | Status: DC | PRN
  Administered 2018-02-23: 4 mg via INTRAVENOUS

## 2018-02-23 MED ORDER — MIDAZOLAM HCL 2 MG/2ML IJ SOLN
INTRAMUSCULAR | Status: AC
  Filled 2018-02-23: qty 2

## 2018-02-23 MED ORDER — SUGAMMADEX SODIUM 200 MG/2ML IV SOLN
INTRAVENOUS | Status: AC
  Filled 2018-02-23: qty 2

## 2018-02-23 MED ORDER — PANTOPRAZOLE SODIUM 40 MG PO TBEC
40.0000 mg | DELAYED_RELEASE_TABLET | Freq: Two times a day (BID) | ORAL | Status: DC
  Administered 2018-02-23 – 2018-02-25 (×4): 40 mg via ORAL
  Filled 2018-02-23 (×4): qty 1

## 2018-02-23 SURGICAL SUPPLY — 68 items
BATTERY INSTRU NAVIGATION (MISCELLANEOUS) ×12 IMPLANT
BLADE SAGITTAL 25.0X1.19X90 (BLADE) ×2 IMPLANT
BLADE SAGITTAL 25.0X1.19X90MM (BLADE) ×1
BLADE SAW 1/2 (BLADE) ×3 IMPLANT
BLADE SAW 70X12.5 (BLADE) IMPLANT
BONE CEMENT GENTAMICIN (Cement) ×6 IMPLANT
BTRY SRG DRVR LF (MISCELLANEOUS) ×4
CANISTER SUCT 1200ML W/VALVE (MISCELLANEOUS) ×3 IMPLANT
CANISTER SUCT 3000ML PPV (MISCELLANEOUS) ×6 IMPLANT
CAP KNEE TOTAL 3 SIGMA ×2 IMPLANT
CEMENT BONE GENTAMICIN 40 (Cement) IMPLANT
COOLER POLAR GLACIER W/PUMP (MISCELLANEOUS) ×3 IMPLANT
CUFF TOURN 24 STER (MISCELLANEOUS) IMPLANT
CUFF TOURN 30 STER DUAL PORT (MISCELLANEOUS) IMPLANT
DRAPE SHEET LG 3/4 BI-LAMINATE (DRAPES) ×3 IMPLANT
DRSG DERMACEA 8X12 NADH (GAUZE/BANDAGES/DRESSINGS) ×3 IMPLANT
DRSG OPSITE POSTOP 4X14 (GAUZE/BANDAGES/DRESSINGS) ×3 IMPLANT
DRSG TEGADERM 4X4.75 (GAUZE/BANDAGES/DRESSINGS) ×3 IMPLANT
DURAPREP 26ML APPLICATOR (WOUND CARE) ×6 IMPLANT
ELECT CAUTERY BLADE 6.4 (BLADE) ×3 IMPLANT
ELECT REM PT RETURN 9FT ADLT (ELECTROSURGICAL) ×3
ELECTRODE REM PT RTRN 9FT ADLT (ELECTROSURGICAL) ×1 IMPLANT
EX-PIN ORTHOLOCK NAV 4X150 (PIN) ×6 IMPLANT
GLOVE BIOGEL M STRL SZ7.5 (GLOVE) ×6 IMPLANT
GLOVE BIOGEL PI IND STRL 9 (GLOVE) ×1 IMPLANT
GLOVE BIOGEL PI INDICATOR 9 (GLOVE) ×2
GLOVE INDICATOR 8.0 STRL GRN (GLOVE) ×3 IMPLANT
GLOVE SURG SYN 9.0  PF PI (GLOVE) ×2
GLOVE SURG SYN 9.0 PF PI (GLOVE) ×1 IMPLANT
GOWN STRL REUS W/ TWL LRG LVL3 (GOWN DISPOSABLE) ×2 IMPLANT
GOWN STRL REUS W/TWL 2XL LVL3 (GOWN DISPOSABLE) ×3 IMPLANT
GOWN STRL REUS W/TWL LRG LVL3 (GOWN DISPOSABLE) ×6
HEMOVAC 400CC 10FR (MISCELLANEOUS) ×3 IMPLANT
HOLDER FOLEY CATH W/STRAP (MISCELLANEOUS) ×3 IMPLANT
HOOD PEEL AWAY FLYTE STAYCOOL (MISCELLANEOUS) ×6 IMPLANT
KIT TURNOVER KIT A (KITS) ×3 IMPLANT
KNIFE SCULPS 14X20 (INSTRUMENTS) ×3 IMPLANT
LABEL OR SOLS (LABEL) ×3 IMPLANT
NDL SAFETY ECLIPSE 18X1.5 (NEEDLE) ×1 IMPLANT
NDL SPNL 20GX3.5 QUINCKE YW (NEEDLE) ×2 IMPLANT
NEEDLE HYPO 18GX1.5 SHARP (NEEDLE) ×3
NEEDLE SPNL 20GX3.5 QUINCKE YW (NEEDLE) ×6 IMPLANT
NS IRRIG 500ML POUR BTL (IV SOLUTION) ×3 IMPLANT
PACK TOTAL KNEE (MISCELLANEOUS) ×3 IMPLANT
PAD WRAPON POLAR KNEE (MISCELLANEOUS) ×1 IMPLANT
PIN DRILL QUICK PACK ×3 IMPLANT
PIN FIXATION 1/8DIA X 3INL (PIN) ×9 IMPLANT
PULSAVAC PLUS IRRIG FAN TIP (DISPOSABLE) ×3
SOL .9 NS 3000ML IRR  AL (IV SOLUTION) ×2
SOL .9 NS 3000ML IRR AL (IV SOLUTION) ×1
SOL .9 NS 3000ML IRR UROMATIC (IV SOLUTION) ×1 IMPLANT
SOL PREP PVP 2OZ (MISCELLANEOUS) ×3
SOLUTION PREP PVP 2OZ (MISCELLANEOUS) ×1 IMPLANT
SPONGE DRAIN TRACH 4X4 STRL 2S (GAUZE/BANDAGES/DRESSINGS) ×3 IMPLANT
STAPLER SKIN PROX 35W (STAPLE) ×3 IMPLANT
STRAP TIBIA SHORT (MISCELLANEOUS) ×3 IMPLANT
SUCTION FRAZIER HANDLE 10FR (MISCELLANEOUS) ×2
SUCTION TUBE FRAZIER 10FR DISP (MISCELLANEOUS) ×1 IMPLANT
SUT VIC AB 0 CT1 36 (SUTURE) ×3 IMPLANT
SUT VIC AB 1 CT1 36 (SUTURE) ×6 IMPLANT
SUT VIC AB 2-0 CT2 27 (SUTURE) ×3 IMPLANT
SYR 20CC LL (SYRINGE) ×3 IMPLANT
SYR 30ML LL (SYRINGE) ×6 IMPLANT
TIP FAN IRRIG PULSAVAC PLUS (DISPOSABLE) ×1 IMPLANT
TOWEL OR 17X26 4PK STRL BLUE (TOWEL DISPOSABLE) ×3 IMPLANT
TOWER CARTRIDGE SMART MIX (DISPOSABLE) ×3 IMPLANT
TRAY FOLEY MTR SLVR 16FR STAT (SET/KITS/TRAYS/PACK) ×3 IMPLANT
WRAPON POLAR PAD KNEE (MISCELLANEOUS) ×3

## 2018-02-23 NOTE — Transfer of Care (Signed)
Immediate Anesthesia Transfer of Care Note  Patient: Jamie Schultz  Procedure(s) Performed: COMPUTER ASSISTED TOTAL KNEE ARTHROPLASTY (Left )  Patient Location: PACU  Anesthesia Type:General  Level of Consciousness: awake, oriented and patient cooperative  Airway & Oxygen Therapy: Patient Spontanous Breathing and Patient connected to nasal cannula oxygen  Post-op Assessment: Report given to RN and Post -op Vital signs reviewed and stable  Post vital signs: Reviewed and stable  Last Vitals:  Vitals Value Taken Time  BP 139/88 02/23/2018  3:30 PM  Temp    Pulse 71 02/23/2018  3:33 PM  Resp 15 02/23/2018  3:33 PM  SpO2 100 % 02/23/2018  3:33 PM  Vitals shown include unvalidated device data.  Last Pain:  Vitals:   02/23/18 1012  TempSrc: Oral  PainSc: 8          Complications: No apparent anesthesia complications

## 2018-02-23 NOTE — Progress Notes (Signed)
Pt arrived to room 151 from PACU. Pt is A&Ox4. Pt has polar care, hemovac, and foley in place. Pt immediately stated she wanted the Foley catheter out d/t pain and discomfort. Dr. Marry Guan made aware and foley d/c. Pt can move bothl legs and has +2 dp pulses; she does report some numbness on the outside of both legs, but that she has had this for several months pre-op. Pt and her husband are updated on plan of care and pain medication regimen. Pt did stand at EOB.   [place], Jerry Caras

## 2018-02-23 NOTE — Discharge Instructions (Signed)
°  Instructions after Total Knee Replacement ° ° Brieanna Nau P. Gertrue Willette, Jr., M.D.    ° Dept. of Orthopaedics & Sports Medicine ° [facility] ° [address] ° [place], [place]  [address] ° Phone: [phone]   Fax: [phone] ° °  °DIET: °• Drink plenty of non-alcoholic fluids. °• Resume your normal diet. Include foods high in fiber. ° °ACTIVITY:  °• You may use crutches or a walker with weight-bearing as tolerated, unless instructed otherwise. °• You may be weaned off of the walker or crutches by your Physical Therapist.  °• Do NOT place pillows under the knee. Anything placed under the knee could limit your ability to straighten the knee.   °• Continue doing gentle exercises. Exercising will reduce the pain and swelling, increase motion, and prevent muscle weakness.   °• Please continue to use the TED compression stockings for 6 weeks. You may remove the stockings at night, but should reapply them in the morning. °• Do not drive or operate any equipment until instructed. ° °WOUND CARE:  °• Continue to use the PolarCare or ice packs periodically to reduce pain and swelling. °• You may bathe or shower after the staples are removed at the first office visit following surgery. ° °MEDICATIONS: °• You may resume your regular medications. °• Please take the pain medication as prescribed on the medication. °• Do not take pain medication on an empty stomach. °• You have been given a prescription for a blood thinner (Lovenox or Coumadin). Please take the medication as instructed. (NOTE: After completing a 2 week course of Lovenox, take one Enteric-coated aspirin once a day. This along with elevation will help reduce the possibility of phlebitis in your operated leg.) °• Do not drive or drink alcoholic beverages when taking pain medications. ° °CALL THE OFFICE FOR: °• Temperature above 101 degrees °• Excessive bleeding or drainage on the dressing. °• Excessive swelling, coldness, or paleness of the toes. °• Persistent  nausea and vomiting. ° °FOLLOW-UP:  °• You should have an appointment to return to the office in 10-14 days after surgery. °• Arrangements have been made for continuation of Physical Therapy (either home therapy or outpatient therapy). °  °

## 2018-02-23 NOTE — Anesthesia Preprocedure Evaluation (Addendum)
Anesthesia Evaluation  Patient identified by MRN, date of birth, ID band Patient awake    Reviewed: Allergy & Precautions, NPO status , Patient's Chart, lab work & pertinent test results  Airway Mallampati: II  TM Distance: >3 FB Neck ROM: Limited    Dental  (+) Upper Dentures, Lower Dentures   Pulmonary pneumonia, resolved, COPD,  COPD inhaler, Current Smoker,    breath sounds clear to auscultation       Cardiovascular + Peripheral Vascular Disease  negative cardio ROS   Rhythm:Regular Rate:Normal     Neuro/Psych PSYCHIATRIC DISORDERS Anxiety Depression Bipolar Disorder  Neuromuscular disease    GI/Hepatic negative GI ROS, (+) Hepatitis -, C  Endo/Other  Hypothyroidism   Renal/GU CRFRenal disease  negative genitourinary   Musculoskeletal  (+) Arthritis , Osteoarthritis,  Fibromyalgia -  Abdominal   Peds negative pediatric ROS (+)  Hematology   Anesthesia Other Findings Past Medical History: No date: Abscess No date: ADHD No date: Allergy No date: Anxiety No date: Arthritis No date: Bipolar disorder ([place]) No date: Chronic back pain 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 No date: COPD (chronic obstructive pulmonary disease) (HCC) No date: Fibromyalgia No date: History of kidney stones No date: Kidney failure     Comment:  acute - reaction to sulfa drugs No date: MRSA (methicillin resistant staph aureus) culture positive     Comment:  Hx: of No date: Muscle spasm     Comment:  arms No date: Muscle spasms of both lower extremities No date: Numbness No date: Panic attack No date: Pneumonia     Comment:  2009 No date: Wears dentures     Comment:  full upper and lower  Patient can take dilaudid, demerol  Reproductive/Obstetrics                            Lab Results  Component Value Date   WBC 9.2 02/11/2018   HGB 15.3 02/11/2018   HCT 45.4 02/11/2018   MCV 91.7 02/11/2018    PLT 361 02/11/2018   Lab Results  Component Value Date   CREATININE 0.91 02/11/2018   BUN 16 02/11/2018   NA 138 02/11/2018   K 3.9 02/11/2018   CL 103 02/11/2018   CO2 24 02/11/2018   Lab Results  Component Value Date   INR 0.91 02/11/2018   INR 1.0 06/14/2015   INR 1.0 03/23/2013   02/2016 EKG: normal sinus rhythm.   Anesthesia Physical  Anesthesia Plan  ASA: III  Anesthesia Plan: General   Post-op Pain Management:    Induction: Intravenous  PONV Risk Score and Plan:   Airway Management Planned: Oral ETT  Additional Equipment:   Intra-op Plan:   Post-operative Plan: Extubation in OR  Informed Consent: I have reviewed the patients History and Physical, chart, labs and discussed the procedure including the risks, benefits and alternatives for the proposed anesthesia with the patient or authorized representative who has indicated his/her understanding and acceptance.     Plan Discussed with: CRNA  Anesthesia Plan Comments: (Patient refuses regional block)       Anesthesia Quick Evaluation

## 2018-02-23 NOTE — Op Note (Signed)
OPERATIVE NOTE  DATE OF SURGERY:  02/23/2018  PATIENT NAME:  Jamie Schultz   DOB: 09/22/1960  MRN: [id number]  PRE-OPERATIVE DIAGNOSIS: Degenerative arthrosis of the left knee, primary  POST-OPERATIVE DIAGNOSIS:  Same  PROCEDURE:  Left total knee arthroplasty using computer-assisted navigation  SURGEON:  Marciano Sequin. M.D.  ASSISTANT:  Vance Peper, PA (present and scrubbed throughout the case, critical for assistance with exposure, retraction, instrumentation, and closure)  ANESTHESIA: general  ESTIMATED BLOOD LOSS: 50 mL  FLUIDS REPLACED: 1400 mL of crystalloid  TOURNIQUET TIME: 86 minutes  DRAINS: 2 medium Hemovac drains  SOFT TISSUE RELEASES: Anterior cruciate ligament, posterior cruciate ligament, deep medial collateral ligament, patellofemoral ligament  IMPLANTS UTILIZED: DePuy PFC Sigma size 3 posterior stabilized femoral component (cemented), size 3 MBT tibial component (cemented), 38 mm 3 peg oval dome patella (cemented), and a 10 mm stabilized rotating platform polyethylene insert.  INDICATIONS FOR SURGERY: Jamie Schultz is a 57 y.o. year old female with a long history of progressive knee pain. X-rays demonstrated severe degenerative changes in tricompartmental fashion. The patient had not seen any significant improvement despite conservative nonsurgical intervention. After discussion of the risks and benefits of surgical intervention, the patient expressed understanding of the risks benefits and agree with plans for total knee arthroplasty.   The risks, benefits, and alternatives were discussed at length including but not limited to the risks of infection, bleeding, nerve injury, stiffness, blood clots, the need for revision surgery, cardiopulmonary complications, among others, and they were willing to proceed.  PROCEDURE IN DETAIL: The patient was brought into the operating room and, after adequate general anesthesia was achieved, a tourniquet was placed on the  patient's upper thigh. The patient's knee and leg were cleaned and prepped with alcohol and DuraPrep and draped in the usual sterile fashion. A "timeout" was performed as per usual protocol. The lower extremity was exsanguinated using an Esmarch, and the tourniquet was inflated to 300 mmHg. An anterior longitudinal incision was made followed by a standard mid vastus approach. The deep fibers of the medial collateral ligament were elevated in a subperiosteal fashion off of the medial flare of the tibia so as to maintain a continuous soft tissue sleeve. The patella was subluxed laterally and the patellofemoral ligament was incised. Inspection of the knee demonstrated severe degenerative changes with full-thickness loss of articular cartilage. Osteophytes were debrided using a rongeur. Anterior and posterior cruciate ligaments were excised. Two 4.0 mm Schanz pins were inserted in the femur and into the tibia for attachment of the array of trackers used for computer-assisted navigation. Hip center was identified using a circumduction technique. Distal landmarks were mapped using the computer. The distal femur and proximal tibia were mapped using the computer. The distal femoral cutting guide was positioned using computer-assisted navigation so as to achieve a 5 distal valgus cut. The femur was sized and it was felt that a size 3 femoral component was appropriate. A size 3 femoral cutting guide was positioned and the anterior cut was performed and verified using the computer. This was followed by completion of the posterior and chamfer cuts. Femoral cutting guide for the central box was then positioned in the center box cut was performed.  Attention was then directed to the proximal tibia. Medial and lateral menisci were excised. The extramedullary tibial cutting guide was positioned using computer-assisted navigation so as to achieve a 0 varus-valgus alignment and 0 posterior slope. The cut was performed and  verified using the computer. The  proximal tibia was sized and it was felt that a size 3 tibial tray was appropriate. Tibial and femoral trials were inserted followed by insertion of a 10 mm polyethylene insert. This allowed for excellent mediolateral soft tissue balancing both in flexion and in full extension. Finally, the patella was cut and prepared so as to accommodate a 38 mm 3 peg oval dome patella. A patella trial was placed and the knee was placed through a range of motion with excellent patellar tracking appreciated. The femoral trial was removed after debridement of posterior osteophytes. The central post-hole for the tibial component was reamed followed by insertion of a keel punch. Tibial trials were then removed. Cut surfaces of bone were irrigated with copious amounts of normal saline with antibiotic solution using pulsatile lavage and then suctioned dry. Polymethylmethacrylate cement with gentamicin was prepared in the usual fashion using a vacuum mixer. Cement was applied to the cut surface of the proximal tibia as well as along the undersurface of a size 3 MBT tibial component. Tibial component was positioned and impacted into place. Excess cement was removed using [employer] fast streamer. Cement was then applied to the cut surfaces of the femur as well as along the posterior flanges of the size 3 femoral component. The femoral component was positioned and impacted into place. Excess cement was removed using [employer] fast streamer. A 10 mm polyethylene trial was inserted and the knee was brought into full extension with steady axial compression applied. Finally, cement was applied to the backside of a 38 mm 3 peg oval dome patella and the patellar component was positioned and patellar clamp applied. Excess cement was removed using [employer] fast streamer. After adequate curing of the cement, the tourniquet was deflated after a total tourniquet time of 86 minutes. Hemostasis was achieved using electrocautery. The knee was  irrigated with copious amounts of normal saline with antibiotic solution using pulsatile lavage and then suctioned dry. 20 mL of 1.3% Exparel and 60 mL of 0.25% Marcaine in 40 mL of normal saline was injected along the posterior capsule, medial and lateral gutters, and along the arthrotomy site. A 10 mm stabilized rotating platform polyethylene insert was inserted and the knee was placed through a range of motion with excellent mediolateral soft tissue balancing appreciated and excellent patellar tracking noted. 2 medium drains were placed in the wound bed and brought out through separate stab incisions. The medial parapatellar portion of the incision was reapproximated using interrupted sutures of #1 Vicryl. Subcutaneous tissue was approximated in layers using first #0 Vicryl followed #2-0 Vicryl. The skin was approximated with skin staples. A sterile dressing was applied.  The patient tolerated the procedure well and was transported to the recovery room in stable condition.    Waynesha Rammel P. Holley Bouche., M.D.

## 2018-02-23 NOTE — Anesthesia Post-op Follow-up Note (Signed)
Anesthesia QCDR form completed.        

## 2018-02-23 NOTE — H&P (Signed)
The patient has been re-examined, and the chart reviewed, and there have been no interval changes to the documented history and physical.    The risks, benefits, and alternatives have been discussed at length. The patient expressed understanding of the risks benefits and agreed with plans for surgical intervention.  James P. Hooten, Jr. M.D.    

## 2018-02-23 NOTE — Progress Notes (Signed)
Pt alert and very restless in bed. She has pulled apart polar care tubing, trying to sit on edge of bed unassisted, not using call light, impulsive.pt is lifting operative leg off bed and bending knee and c/o pain. Educated pt in regard to movement of operative leg leading to pain. Pt repositioned in bed and bone foam applied. Pt is tearful at times and verbose. Call bell in reach and pt instructed to call for needs.

## 2018-02-23 NOTE — Anesthesia Procedure Notes (Signed)
Procedure Name: Intubation Date/Time: 02/23/2018 12:14 PM Performed by: Bernardo Heater, CRNA Pre-anesthesia Checklist: Patient identified, Emergency Drugs available, Suction available and Patient being monitored Patient Re-evaluated:Patient Re-evaluated prior to induction Oxygen Delivery Method: Circle system utilized Preoxygenation: Pre-oxygenation with 100% oxygen Induction Type: IV induction Laryngoscope Size: Mac and 3 Grade View: Grade I Tube type: Oral Tube size: 7.0 mm Number of attempts: 1 Placement Confirmation: ETT inserted through vocal cords under direct vision,  positive ETCO2 and breath sounds checked- equal and bilateral Secured at: 21 cm Tube secured with: Tape Dental Injury: Teeth and Oropharynx as per pre-operative assessment

## 2018-02-24 MED ORDER — ENOXAPARIN SODIUM 40 MG/0.4ML ~~LOC~~ SOLN: 40.0000 mg | SUBCUTANEOUS | 0 refills | Status: DC

## 2018-02-24 MED ORDER — OXYCODONE HCL 5 MG PO TABS: 5.0000 mg | ORAL_TABLET | ORAL | 0 refills | Status: DC | PRN

## 2018-02-24 NOTE — Progress Notes (Signed)
This nurse was notified by [employer] that pt took a pill out of pill bottle she found in her suitcase. Bottle was full of lyrica prescribed for patient. Pt states she had one nausea pill in bottle and that is what she took. Both pill bottles given to husband for him to take home.

## 2018-02-24 NOTE — Discharge Summary (Signed)
Physician Discharge Summary  Patient ID: Jamie Schultz MRN: [id number] DOB/AGE: 59-Dec-1962 59 y.o.  Admit date: 02/23/2018 Discharge date: 02/25/2018  Admission Diagnoses:  PRIMARY OSTEOARTHRITIS OF LEFT KNEE   Discharge Diagnoses: Patient Active Problem List   Diagnosis Date Noted  . S/P total knee arthroplasty 02/23/2018  . Primary osteoarthritis of left knee 12/19/2017  . Smoker 09/21/2017  . Aortic atherosclerosis ([place]) 09/18/2017  . Disorder of bone, unspecified 07/27/2017  . Other long term (current) drug therapy 07/27/2017  . Other specified health status 07/27/2017  . Chronic pain syndrome 07/27/2017  . Chronic low back pain [facility] Area of Pain) (B (L>R) 07/27/2017  . Chronic neck pain (Primary Area of Pain) (Bilateral) (L>R) 07/27/2017  . Chronic bilateral thoracic back pain (Secondary Area of Pain) (B (L>R) 07/27/2017  . Chronic left shoulder pain (Fourth Area of Pain) 07/27/2017  . Chronic pain of lower extremity (Bilateral) (L>R) 07/27/2017  . Bilateral chronic knee pain (B (L>R) 07/27/2017  . LAD (lymphadenopathy) of right cervical region 12/16/2016  . Skin lesions 12/16/2016  . Family history of melanoma 12/16/2016  . Swelling of both lower extremities 10/07/2016  . S/P shoulder replacement 09/29/2016  . Abnormality of gait 07/28/2016  . Paresthesia 07/28/2016  . Bilateral primary osteoarthritis of knee 07/07/2016  . Prediabetes 04/24/2016  . Spinal stenosis of thoracolumbar region 03/24/2016  . Elevated LFTs 09/04/2015  . Spondylolisthesis of lumbar region 07/31/2015  . Cervical stenosis of spinal canal 07/31/2015  . Low libido 06/26/2015  . Muscle wasting 06/14/2015  . Nausea in adult patient 06/14/2015  . Therapeutic opioid induced constipation 06/14/2015  . Blood in stool   . Benign neoplasm of descending colon   . Rectal prolapse 04/20/2015  . Chronic pain of multiple joints 04/19/2015  . Left rotator cuff tear arthropathy 03/28/2015  .  Complete tear of left rotator cuff 01/31/2015  . False positive serological test for hepatitis C 03/10/2012  . Hypothyroidism 02/29/2012  . Vitamin D deficiency 02/29/2012  . Atrophic vaginitis 02/29/2012  . Hyperlipidemia 02/11/2012  . 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 Major depressive disorder, recurrent episode, 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[place]) 01/29/2012  . Chronic back pain     Past Medical History:  Diagnosis Date  . Abscess   . ADHD   . Allergy   . Anxiety   . Arthritis   . Bipolar disorder ([place])   . Chronic back pain   . Congenital prolapsed rectum   . COPD (chronic obstructive pulmonary disease) ([place])   . Fibromyalgia   . History of kidney stones   . Kidney failure    acute - reaction to sulfa drugs  . MRSA (methicillin resistant staph aureus) culture positive    Hx: of  . Muscle spasm    arms  . Muscle spasms of both lower extremities   . Numbness   . Panic attack   . Pneumonia    2009  . Wears dentures    full upper and lower     Transfusion: No transfusions during this admission   Consultants (if any):   Discharged Condition: Improved  Hospital Course: Jamie Schultz is an 57 y.o. female who was admitted 02/23/2018 with a diagnosis of degenerative arthrosis left knee and went to the operating room on 02/23/2018 and underwent the above named procedures.    Surgeries:Procedure(s): COMPUTER ASSISTED TOTAL KNEE ARTHROPLASTY on 02/23/2018  PRE-OPERATIVE DIAGNOSIS: Degenerative arthrosis of the left knee, primary  POST-OPERATIVE DIAGNOSIS:  Same  PROCEDURE:  Left total knee arthroplasty using computer-assisted navigation  SURGEON:  Marciano Sequin. M.D.  ASSISTANT:  Vance Peper, PA (present and scrubbed throughout the case, critical for assistance with exposure, retraction, instrumentation, and closure)  ANESTHESIA: general  ESTIMATED BLOOD LOSS: 50 mL  FLUIDS REPLACED: 1400 mL of crystalloid  TOURNIQUET TIME: 86  minutes  DRAINS: 2 medium Hemovac drains  SOFT TISSUE RELEASES: Anterior cruciate ligament, posterior cruciate ligament, deep medial collateral ligament, patellofemoral ligament  IMPLANTS UTILIZED: DePuy PFC Sigma size 3 posterior stabilized femoral component (cemented), size 3 MBT tibial component (cemented), 38 mm 3 peg oval dome patella (cemented), and a 10 mm stabilized rotating platform polyethylene insert.  INDICATIONS FOR SURGERY: Jamie Schultz is a 57 y.o. year old female with a long history of progressive knee pain. X-rays demonstrated severe degenerative changes in tricompartmental fashion. The patient had not seen any significant improvement despite conservative nonsurgical intervention. After discussion of the risks and benefits of surgical intervention, the patient expressed understanding of the risks benefits and agree with plans for total knee arthroplasty.   The risks, benefits, and alternatives were discussed at length including but not limited to the risks of infection, bleeding, nerve injury, stiffness, blood clots, the need for revision surgery, cardiopulmonary complications, among others, and they were willing to proceed.   Patient tolerated the surgery well. No complications .Patient was taken to PACU where she was stabilized and then transferred to the orthopedic floor.  Patient started on Lovenox 30 mg q 12 hrs. Foot pumps applied bilaterally at 80 mm hgb. Heels elevated off bed with rolled towels. No evidence of DVT. Calves non tender. Negative Homan. Physical therapy started on day #1 for gait training and transfer with OT starting on  day #1 for ADL and assisted devices. Patient has done well with therapy. Ambulated greater than 200 feet upon being discharged.  Was able to ascend and descend 4 steps safely and independently  Patient's IV And Foley were discontinued on day #1 with Hemovac being discontinued on day #2. Dressing was changed on day 2 prior to patient  being discharged   She was given perioperative antibiotics:  Anti-infectives (From admission, onward)   Start     Dose/Rate Route Frequency Ordered Stop   02/23/18 1830  clindamycin (CLEOCIN) IVPB 600 mg     600 mg 100 mL/hr over 30 Minutes Intravenous Every 6 hours 02/23/18 1715 02/24/18 1829   02/23/18 0600  clindamycin (CLEOCIN) IVPB 900 mg  Status:  Discontinued     900 mg 100 mL/hr over 30 Minutes Intravenous On call to O.R. 02/23/18 0104 02/23/18 0944    .  She was fitted with AV 1 compression foot pump devices, instructed on heel pumps, early ambulation, and fitted with TED stockings bilaterally for DVT prophylaxis.  She benefited maximally from the hospital stay and there were no complications.    Recent vital signs:  Vitals:   02/24/18 0444 02/24/18 0729  BP: (!) 144/83 128/85  Pulse: 66 68  Resp: 19   Temp: (!) 97.4 F (36.3 C) 97.6 F (36.4 C)  SpO2: 92% 100%    Recent laboratory studies:  Lab Results  Component Value Date   HGB 15.3 02/11/2018   HGB 11.1 (L) 09/30/2016   HGB 14.1 09/21/2016   Lab Results  Component Value Date   WBC 9.2 02/11/2018   PLT 361 02/11/2018   Lab Results  Component Value Date   INR 0.91 02/11/2018   Lab Results  Component  Value Date   NA 138 02/11/2018   K 3.9 02/11/2018   CL 103 02/11/2018   CO2 24 02/11/2018   BUN 16 02/11/2018   CREATININE 0.91 02/11/2018   GLUCOSE 79 02/11/2018    Discharge Medications:   Allergies as of 02/24/2018      Reactions   Bee Venom Anaphylaxis, Other (See Comments)   Bees/wasps/yellow jackets   Keflex [cephalexin] Anaphylaxis   Penicillins Anaphylaxis, Other (See Comments)   Has patient had a PCN reaction causing immediate rash, facial/tongue/throat swelling, SOB or lightheadedness with hypotension: Yes Has patient had a PCN reaction causing severe rash involving mucus membranes or skin necrosis: No Has patient had a PCN reaction that required hospitalization Yes, in the hospital  already Has patient had a PCN reaction occurring within the last 10 years: Yes If all of the above answers are "NO", then may proceed with Cephalosporin use.   Sulfa Antibiotics Other (See Comments)   Renal failure   Hydrocodone Rash, Other (See Comments)   "I couldn't get my breath"   Ibuprofen Other (See Comments)   Vomiting blood   Contrast Media [iodinated Diagnostic Agents] Other (See Comments)   Patient states she had a previous reaction to iodinated contrast media agents. Premedicate.   Nsaids Itching   Codeine Itching, Rash   Cyclobenzaprine Other (See Comments)   Severe constipation   Fentanyl Nausea And Vomiting, Other (See Comments)   Severe vomiting   Methadone Other (See Comments)   Change in mental status   Neurontin [gabapentin] Hives   Tape Itching, Rash, Other (See Comments)   Blisters skin, Please use "paper" tape   Tegretol [carbamazepine] Hives, Rash   Toradol [ketorolac Tromethamine] Hives, Rash   Tramadol Hives, Rash   Tylenol [acetaminophen] Rash, Other (See Comments)   Vomiting blood      Medication List    TAKE these medications   albuterol 108 (90 Base) MCG/ACT inhaler Commonly known as:  PROVENTIL HFA;VENTOLIN HFA Inhale 2 puffs into the lungs every 6 (six) hours as needed for wheezing or shortness of breath.   ALPRAZolam 1 MG tablet Commonly known as:  XANAX Take 1 mg by mouth 4 (four) times daily.   amphetamine-dextroamphetamine 20 MG tablet Commonly known as:  ADDERALL Take 20 mg by mouth 2 (two) times daily.   BELSOMRA 20 MG Tabs Generic drug:  Suvorexant Take 20 mg by mouth at bedtime   DULoxetine 60 MG capsule Commonly known as:  CYMBALTA Take 1 capsule (60 mg total) by mouth daily.   enoxaparin 40 MG/0.4ML injection Commonly known as:  LOVENOX Inject 0.4 mLs (40 mg total) into the skin daily for 14 days. Start taking on:  02/26/2018   EPIPEN 2-PAK 0.3 mg/0.3 mL Soaj injection Generic drug:  EPINEPHrine USE AS DIRECTED FOR  ANAPYLACTIC REACTION (TO BEE STINGS)   hydrOXYzine 25 MG tablet Commonly known as:  ATARAX/VISTARIL TAKE 1 TABLET (25 MG TOTAL) BY MOUTH EVERY 8 (EIGHT) HOURS AS NEEDED. What changed:  See the new instructions.   LYRICA 100 MG capsule Generic drug:  pregabalin Take 1 capsule (100 mg total) by mouth 3 (three) times daily.   mometasone-formoterol 200-5 MCG/ACT Aero Commonly known as:  DULERA Inhale 2 puffs into the lungs 2 (two) times daily. What changed:    when to take this  reasons to take this   multivitamin with minerals Tabs tablet Take 1 tablet by mouth daily.   oxyCODONE 5 MG immediate release tablet Commonly known as:  Oxy  IR/ROXICODONE Take 1 tablet (5 mg total) by mouth every 4 (four) hours as needed for moderate pain (pain score 4-6).   promethazine 12.5 MG suppository Commonly known as:  PHENERGAN Place 12.5 mg rectally every 8 (eight) hours as needed for nausea or vomiting.   QUEtiapine 400 MG tablet Commonly known as:  SEROQUEL Takes 800 mg by mouth at bedtime.            Durable Medical Equipment  (From admission, onward)        Start     Ordered   02/23/18 1713  DME Walker rolling  Once    Question:  Patient needs a walker to treat with the following condition  Answer:  Total knee replacement status   02/23/18 1715   02/23/18 1713  DME Bedside commode  Once    Question:  Patient needs a bedside commode to treat with the following condition  Answer:  Total knee replacement status   02/23/18 1715      Diagnostic Studies: Dg Knee Left Port  Result Date: 02/23/2018 CLINICAL DATA:  Left total knee replacement. EXAM: PORTABLE LEFT KNEE - 1-2 VIEW COMPARISON:  Left knee x-rays dated October 27, 2017. FINDINGS: The left knee demonstrates a total knee arthroplasty without evidence of hardware failure or complication. There is expected intra-articular air. There is no fracture or dislocation. The alignment is anatomic. Surgical drains are present.  Post-surgical changes noted in the surrounding soft tissues. IMPRESSION: Interval total left knee arthroplasty without evidence of acute postoperative complication. Electronically Signed   By: Titus Dubin M.D.   On: 02/23/2018 16:23    Disposition:   Discharge Instructions    Diet - low sodium heart healthy   Complete by:  As directed    Increase activity slowly   Complete by:  As directed       Follow-up Information    Watt Climes, PA On 03/10/2018.   Specialty:  Physician Assistant Why:  at 2:15pm Contact information: [place] [address] [phone]        Dereck Leep, MD On 04/07/2018.   Specialty:  Orthopedic Surgery Why:  at 9:45am Contact information: [place] [address] [phone]            Signed: Watt Climes 02/24/2018, 7:44 AM

## 2018-02-24 NOTE — Progress Notes (Signed)
Clinical Social Worker (CSW) received SNF consult. PT is recommending home health. RN case manager aware of above. Please reconsult if future social work needs arise. CSW signing off.   Jay Kempe, LCSW [phone] 

## 2018-02-24 NOTE — Care Management Note (Signed)
Case Management Note  Patient Details  Name: Jamie Schultz MRN: [id number] Date of Birth: 01/16/1961  Subjective/Objective:    Met with patient and her spouse at bedside to discuss discharge planning. Patient with telesitter on due to taking medications from home inappropriately. Offered a list of home health agencies. Referral to [facility] for HHPT. Spouse will be home for a few days then patient will be alone. Spouse is concerns for patients safety. Tried to give hi a private sitter list but her refused it. He stated he had some one lined up to come in and help but didn't want his wife to know about it yet. Patient feels she will be fine and has been trough this before. Patient will need a walker and bsc. Ordered from Jason with [facility]. [facility]acy: [facility] [place]- Lovenox is $ 3. Patient updated.                 Action/Plan: [facility] for HHPT and HHA and SW. [facility] for walker and bsc.. Lovenox called in.   Expected Discharge Date:                  Expected Discharge Plan:  Home w Home Health Services  In-House Referral:     Discharge planning Services  CM Consult  Post Acute Care Choice:  Durable Medical Equipment, Home Health Choice offered to:  Patient, Spouse  DME Arranged:  Bedside commode, Walker rolling DME Agency:  [facility]  HH Arranged:  PT HH Agency:  [facility] ([facility]  Status of Service:  In process, will continue to follow  If discussed at Long Length of Stay Meetings, dates discussed:    Additional Comments:   M , RN 02/24/2018, 5:07 PM  

## 2018-02-24 NOTE — Progress Notes (Signed)
Pt did sleep several hours after bedtime meds given. Awake at present and fidgety with bedlinens, table items, medical devices. Requesting coffee; decaf coffee given and medicated for pain per mar. Assisted up to bsc with use of walker to void, and back to bed. Pt states she is cold, yet removes covers from the bed. Call bell in reach, bed alarm on.

## 2018-02-24 NOTE — Evaluation (Signed)
Physical Therapy Evaluation Patient Details Name: Jamie Schultz MRN: [id number] DOB: [birth year] Today's Date: 02/24/2018   History of Present Illness  57 y/o s/p L TKA 6/26.  Clinical Impression  Pt is physically able to do quite well with all aspects of PT testing, however she is impulsive and at times needed a lot of cuing and re-direction to stay on task/safe.  She showed excellent strength and mobility as well as easily being able to bend knee to ~110 degrees w/o excessive pain, etc.  Expect pt to be able to circumambulate the nurses' station and perform steps this afternoon.      Follow Up Recommendations Home health PT    Equipment Recommendations  Rolling walker with 5" wheels    Recommendations for Other Services       Precautions / Restrictions Precautions Precautions: Fall;Knee Restrictions Weight Bearing Restrictions: Yes LLE Weight Bearing: Weight bearing as tolerated      Mobility  Bed Mobility Overal bed mobility: Independent             General bed mobility comments: Pt easily and confidently gets to EOB  Transfers Overall transfer level: Independent Equipment used: Rolling walker (2 wheeled)             General transfer comment: Pt is able to rise to standing w/o use of UEs/walker and maintained balance at EOB well  Ambulation/Gait Ambulation/Gait assistance: Modified independent (Device/Increase time) Gait Distance (Feet): 100 Feet Assistive device: Rolling walker (2 wheeled)       General Gait Details: Pt able to walk with very good confidence and consistent cadence when she was able to stay on task.  Pt actually lifted walker and took a few steps w/o UE assist "safely" despite PT cues to not push things too much yet  Stairs            Wheelchair Mobility    Modified Rankin (Stroke Patients Only)       Balance Overall balance assessment: Independent                                           Pertinent  Vitals/Pain Pain Assessment: 0-10 Pain Score: 8     Home Living Family/patient expects to be discharged to:: Private residence Living Arrangements: Spouse/significant other Available Help at Discharge: Family Type of Home: House Home Access: Stairs to enter   Technical brewer of Steps: 3          Prior Function Level of Independence: Independent         Comments: Pt teaches dance classes, reports being able to do all she needs t/o issue     Hand Dominance        Extremity/Trunk Assessment   Upper Extremity Assessment Upper Extremity Assessment: Overall WFL for tasks assessed;Defer to OT evaluation    Lower Extremity Assessment Lower Extremity Assessment: Overall WFL for tasks assessed(Pt able to do SLRs, resisted exercises, etc on L w/o issue)       Communication   Communication: No difficulties  Cognition Arousal/Alertness: Awake/alert Behavior During Therapy: WFL for tasks assessed/performed Overall Cognitive Status: Within Functional Limits for tasks assessed                                 General Comments: Pt needed a lot of cuing, etc to  stay on task, but [facility]      General Comments      Exercises Total Joint Exercises Ankle Circles/Pumps: AROM;10 reps Quad Sets: Strengthening;10 reps Gluteal Sets: Strengthening;10 reps Heel Slides: Strengthening;10 reps Hip ABduction/ADduction: Strengthening;10 reps Straight Leg Raises: AROM;10 reps Knee Flexion: AROM;10 reps Goniometric ROM: 0-113   Assessment/Plan    PT Assessment Patient needs continued PT services  PT Problem List Decreased strength;Decreased range of motion;Decreased activity tolerance;Decreased mobility;Decreased balance;Decreased coordination;Decreased knowledge of use of DME;Decreased safety awareness;Decreased knowledge of precautions;Pain       PT Treatment Interventions DME instruction;Gait training;Stair training;Therapeutic activities;Functional mobility  training;Therapeutic exercise;Balance training;Neuromuscular re-education;Patient/family education    PT Goals (Current goals can be found in the Care Plan section)  Acute Rehab PT Goals Patient Stated Goal: go home PT Goal Formulation: With patient Time For Goal Achievement: 03/10/18 Potential to Achieve Goals: Good    Frequency BID   Barriers to discharge        Co-evaluation               AM-PAC PT "6 Clicks" Daily Activity  Outcome Measure Difficulty turning over in bed (including adjusting bedclothes, sheets and blankets)?: None Difficulty moving from lying on back to sitting on the side of the bed? : None Difficulty sitting down on and standing up from a chair with arms (e.g., wheelchair, bedside commode, etc,.)?: None Help needed moving to and from a bed to chair (including a wheelchair)?: None Help needed walking in hospital room?: None Help needed climbing 3-5 steps with a railing? : None 6 Click Score: 24    End of Session Equipment Utilized During Treatment: Gait belt Activity Tolerance: Patient tolerated treatment well Patient left: with chair alarm set;with call bell/phone within reach Nurse Communication: Mobility status PT Visit Diagnosis: Muscle weakness (generalized) (M62.81);Difficulty in walking, not elsewhere classified (R26.2)    Time: 4650-3546 PT Time Calculation (min) (ACUTE ONLY): 31 min   Charges:   PT Evaluation $PT Eval Low Complexity: 1 Low PT Treatments $Therapeutic Exercise: 8-22 mins   PT G Codes:        Kreg Shropshire, DPT 02/24/2018, 11:57 AM

## 2018-02-24 NOTE — Anesthesia Postprocedure Evaluation (Signed)
Anesthesia Post Note  Patient: Jamie Schultz  Procedure(s) Performed: COMPUTER ASSISTED TOTAL KNEE ARTHROPLASTY (Left )  Patient location during evaluation: PACU Anesthesia Type: General Level of consciousness: awake and alert and oriented Pain management: pain level controlled Vital Signs Assessment: post-procedure vital signs reviewed and stable Respiratory status: spontaneous breathing Cardiovascular status: blood pressure returned to baseline Anesthetic complications: no     Last Vitals:  Vitals:   02/24/18 0444 02/24/18 0729  BP: (!) 144/83 128/85  Pulse: 66 68  Resp: 19   Temp: (!) 36.3 C 36.4 C  SpO2: 92% 100%    Last Pain:  Vitals:   02/24/18 1051  TempSrc:   PainSc: 4                  Kassiah Mccrory

## 2018-02-24 NOTE — Progress Notes (Signed)
When pt is awake, she freqly sets off bed alarm by trying to sit on edge of bed or other same type movements. Pt has been repeatedly instructed to use call bell and not to get oob without assistance.use of call bell reviewed with pt.

## 2018-02-24 NOTE — Progress Notes (Signed)
Physical Therapy Treatment Patient Details Name: Jamie Schultz MRN: [id number] DOB: 1960-12-22 Today's Date: 02/24/2018    History of Present Illness 57 y/o s/p L TKA 6/26.    PT Comments    Pt in bed, voiced frustration over having telesitter in room.  "If they are going to leave that in here, I want my walking papers".  Participated in exercises as described below.  Pt quickly did exercises despite verbal and tactile cues to slow and for proper technique.  She transitioned to edge of bed without assist and was able to make a full lap around nursing unit.  General decreased safety with gait was noted.  At times she would take her hand off walker while walking and experienced 2 LOB's this pm session.  Education was provided.  She did state that she was in more pain this pm which likely affected gait quality.  Upon return to room she left walker to the side and attempted to fold walker up and place it along the wall "I can do it!"  Again safety education given.  She climbed into bed facing bed putting right knee up on bed to climb in.  Primary nurse notified of request for pain medication.  Stairs deferred until tomorrow due to increased pain this pm.   Follow Up Recommendations  Home health PT     Equipment Recommendations  Rolling walker with 5" wheels    Recommendations for Other Services       Precautions / Restrictions Precautions Precautions: Fall;Knee Restrictions Weight Bearing Restrictions: Yes LLE Weight Bearing: Weight bearing as tolerated Other Position/Activity Restrictions: Pt very impulsive with poor safety.    Mobility  Bed Mobility Overal bed mobility: Independent             General bed mobility comments: deferred, up in recliner  Transfers Overall transfer level: Independent Equipment used: Rolling walker (2 wheeled)             General transfer comment: independent, no assist required, but mildly impulsive, requires supervision for  safety  Ambulation/Gait Ambulation/Gait assistance: Min guard Gait Distance (Feet): 160 Feet Assistive device: Rolling walker (2 wheeled) Gait Pattern/deviations: Step-through pattern Gait velocity: decreased   General Gait Details: Pt with increased pain this pm which affected gait.  Poor safey.  Pt took hands off walker x 2 during gait with LOB noted.  min assist and education provided for safety.   Stairs             Wheelchair Mobility    Modified Rankin (Stroke Patients Only)       Balance Overall balance assessment: Independent                                          Cognition Arousal/Alertness: Awake/alert Behavior During Therapy: WFL for tasks assessed/performed;Impulsive;Agitated Overall Cognitive Status: Within Functional Limits for tasks assessed                                 General Comments: max verbal cues to redirect and stay on task, very tangential, but overall [facility]      Exercises Total Joint Exercises Ankle Circles/Pumps: AROM;10 reps Quad Sets: Strengthening;10 reps Gluteal Sets: Strengthening;10 reps Heel Slides: Strengthening;10 reps Hip ABduction/ADduction: Strengthening;10 reps Straight Leg Raises: AROM;10 reps Other Exercises Other Exercises: Pt instructed in compression  stocking mgt and polar care mgt. Verbalized understanding, wrote down notes for spouse    General Comments General comments (skin integrity, edema, etc.): supervision due to impulsive and safety      Pertinent Vitals/Pain Pain Assessment: 0-10 Pain Score: 8  Pain Location: L knee Pain Descriptors / Indicators: Aching;Sore;Grimacing Pain Intervention(s): Patient requesting pain meds-RN notified    Home Living Family/patient expects to be discharged to:: Private residence Living Arrangements: Spouse/significant other Available Help at Discharge: Family Type of Home: House Home Access: Stairs to enter   Home Layout: One  level Home Equipment: Adaptive equipment      Prior Function Level of Independence: Independent      Comments: Pt teaches dance classes, 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 : None Difficulty sitting down on and standing up from a chair with arms (e.g., wheelchair, bedside commode, etc,.)?: None Help needed moving to and from a bed to chair (including a wheelchair)?: None Help needed walking in hospital room?: A Little Help needed climbing 3-5 steps with a railing? : A Little 6 Click Score: 22    End of Session Equipment Utilized During Treatment: Gait belt Activity Tolerance: Patient tolerated treatment well;Patient limited by pain Patient left: in bed;with bed alarm set;with call bell/phone within reach;with family/visitor present;Other (comment)         Time: 1330-1405 PT Time Calculation (min) (ACUTE ONLY): 35 min  Charges:  $Gait Training: 8-22 mins $Therapeutic Exercise: 8-22 mins                    G Codes:       Chesley Noon, PTA 02/24/18, 3:53 PM

## 2018-02-24 NOTE — Progress Notes (Addendum)
Pt's chair alarm going off. Upon arrival to the room, pt trying to get bags from corner of her room. Stated she wanted her clothes. Began pulling items from bag and pulled out an advil bottle. Pt stated she had xanax and seroquil also in advil bottle as she showed this nurse. This nurse attempted to take bottle away from pt but pt refused and became very angry. Nurse assured pt's medications would be locked up safe but pt still refused. Pt put pill bottle back in bag and gave to nurse to put back in corner of the room. Pt stated her husband would take them home with him when he came back to visit. This nurse checked with York Cerise, the charge nurse and she stated that was fine. Telesitter aware to watch for pt trying to mess with bags.

## 2018-02-24 NOTE — Evaluation (Signed)
Occupational Therapy Evaluation Patient Details Name: Jamie Schultz MRN: [id number] DOB: 11-17-1960 Today's Date: 02/24/2018    History of Present Illness 57 y/o s/p L TKA 6/26.   Clinical Impression   Pt seen for OT evaluation this date, POD#1 from above surgery. Pt was independent in all ADLs prior to surgery. Pt is eager to return to PLOF with less pain and improved safety and independence. Will have spouse to assist her as needed once home. Pt currently requires remote supervision assist for LB dressing and bathing for safety but generally can perform without AE well without difficulty. Cues to redirect during session, very tangential and easily distracted. Physically able to perform activities and mobility well. Pt instructed in polar care mgt, falls prevention strategies, home/routines modifications, DME/AE for LB bathing and dressing tasks, and compression stocking mgt. Pt wrote down a few notes for her spouse. No additional skilled OT needs at this time. Will sign off.      Follow Up Recommendations  No OT follow up    Equipment Recommendations  None recommended by OT    Recommendations for Other Services       Precautions / Restrictions Precautions Precautions: Fall;Knee Restrictions Weight Bearing Restrictions: Yes LLE Weight Bearing: Weight bearing as tolerated      Mobility Bed Mobility             General bed mobility comments: deferred, up in recliner  Transfers Overall transfer level: Independent Equipment used: Rolling walker (2 wheeled)             General transfer comment: independent, no assist required, but mildly impulsive, requires supervision for safety    Balance Overall balance assessment: Independent                                         ADL either performed or assessed with clinical judgement   ADL Overall ADL's : Needs assistance/impaired             Lower Body Bathing: Supervison/ safety;Sit to/from  stand       Lower Body Dressing: Supervision/safety;Sit to/from stand   Toilet Transfer: Insurance risk surveyor           Functional mobility during ADLs: Supervision/safety;Rolling walker General ADL Comments: Pt able to perform LB bathing and dressing tasks with supervision, instructed in AE for LB dressing, TTB for tub transfers, and compression stocking mgt. Spouse will have to assess with stockings and polar care. Pt verbalizes that he can do this without difficulty.     Vision Baseline Vision/History: Wears glasses Wears Glasses: Reading only Patient Visual Report: No change from baseline       Perception     Praxis      Pertinent Vitals/Pain Pain Assessment: 0-10 Pain Score: 8  Pain Location: L knee Pain Intervention(s): Limited activity within patient's tolerance;Monitored during session     Hand Dominance     Extremity/Trunk Assessment Upper Extremity Assessment Upper Extremity Assessment: Overall WFL for tasks assessed   Lower Extremity Assessment Lower Extremity Assessment: Defer to PT evaluation;Overall WFL for tasks assessed(Pt able to do SLRs, resisted exercises, etc on L w/o issue)   Cervical / Trunk Assessment Cervical / Trunk Assessment: Normal   Communication Communication Communication: No difficulties   Cognition Arousal/Alertness: Awake/alert Behavior During Therapy: WFL for tasks assessed/performed Overall Cognitive Status: Within Functional Limits for tasks assessed  General Comments: max verbal cues to redirect and stay on task, very tangential, but overall [facility]   General Comments       Exercises Other Exercises Other Exercises: Pt instructed in compression stocking mgt and polar care mgt. 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 Additional ADL Goal #2: Pt will independently instruct spouse in proper compression stocking mgt and polar care mgt to maximize safety and adherence.  OT Frequency:     Barriers to D/C:            Co-evaluation              AM-PAC PT "6 Clicks" Daily Activity     Outcome Measure Help from another person eating meals?: None Help from another person taking care of personal grooming?: None Help from another person toileting, which includes using toliet, bedpan, or urinal?: None Help from another person bathing (including washing, rinsing, drying)?: None Help from another person to put on and taking off regular upper body clothing?: None Help from another person to put on and taking off regular lower body clothing?: None 6 Click Score: 24   End of Session    Activity Tolerance: Patient  tolerated treatment well Patient left: in chair;with call bell/phone within reach;with chair alarm set;with nursing/sitter in room;Other (comment)(polar care in place)  OT Visit Diagnosis: Other abnormalities of gait and mobility (R26.89)                Time: 9562-1308 OT Time Calculation (min): 23 min Charges:  OT General Charges $OT Visit: 1 Visit OT Evaluation $OT Eval Low Complexity: 1 Low OT Treatments $Self Care/Home Management : 8-22 mins   Jeni Salles, MPH, MS, OTR/L ascom [phone] 02/24/18, 12:18 PM

## 2018-02-24 NOTE — Progress Notes (Signed)
   Subjective: 1 Day Post-Op Procedure(s) (LRB): COMPUTER ASSISTED TOTAL KNEE ARTHROPLASTY (Left) Patient reports pain as 5 on 0-10 scale.  To the nurse but when I walk in the room patient states that the pain medicine is not helping.  States that the only thing that helped her was her nighttime medication. Patient sitting up in bed moving all over even flexing the knee to 90 degrees without any assistive. Patient is well, and has had no acute complaints or problems We will start therapy today.  Plan is to go Home after hospital stay. no nausea and no vomiting Patient denies any chest pains or shortness of breath. Patient restless and is moving all over the bed.  Does not seem to be in pain just needs to be moving. Objective: Vital signs in last 24 hours: Temp:  [97.4 F (36.3 C)-98.2 F (36.8 C)] 97.6 F (36.4 C) (06/27 0729) Pulse Rate:  [66-85] 68 (06/27 0729) Resp:  [9-19] 19 (06/27 0444) BP: (113-144)/(71-113) 128/85 (06/27 0729) SpO2:  [92 %-100 %] 100 % (06/27 0729) Heels are non tender and elevated off the bed using rolled towels Intake/Output from previous day: 06/26 0701 - 06/27 0700 In: 4546.7 [P.O.:1060; I.V.:2700; IV Piggyback:606.7] Out: 1710 [Urine:1550; Drains:110; Blood:50] Intake/Output this shift: No intake/output data recorded.  No results for input(s): HGB in the last 72 hours. No results for input(s): WBC, RBC, HCT, PLT in the last 72 hours. No results for input(s): NA, K, CL, CO2, BUN, CREATININE, GLUCOSE, CALCIUM in the last 72 hours. No results for input(s): LABPT, INR in the last 72 hours.  EXAM General - Patient is Alert, Appropriate and Oriented Extremity - Neurologically intact Neurovascular intact Sensation intact distally Intact pulses distally Dorsiflexion/Plantar flexion intact Compartment soft Dressing - dressing C/D/I Motor Function - intact, moving foot and toes well on exam.    Past Medical History:  Diagnosis Date  . Abscess   .  ADHD   . Allergy   . Anxiety   . Arthritis   . Bipolar disorder ([place])   . Chronic back pain   . Congenital prolapsed rectum   . COPD (chronic obstructive pulmonary disease) ([place])   . Fibromyalgia   . History of kidney stones   . Kidney failure    acute - reaction to sulfa drugs  . MRSA (methicillin resistant staph aureus) culture positive    Hx: of  . Muscle spasm    arms  . Muscle spasms of both lower extremities   . Numbness   . Panic attack   . Pneumonia    2009  . Wears dentures    full upper and lower    Assessment/Plan: 1 Day Post-Op Procedure(s) (LRB): COMPUTER ASSISTED TOTAL KNEE ARTHROPLASTY (Left) Active Problems:   S/P total knee arthroplasty  Estimated body mass index is 25.1 kg/m as calculated from the following:   Height as of this encounter: 5' (1.524 m).   Weight as of 02/11/18: 58.3 kg (128 lb 8 oz). Advance diet Up with therapy D/C IV fluids Plan for discharge tomorrow Discharge home with home health  Labs: None DVT Prophylaxis - Lovenox, Foot Pumps and TED hose Weight-Bearing as tolerated to left leg D/C O2 and Pulse OX and try on Room Air Begin working on bowel movement  Jon R. [place] 02/24/2018, 7:38 AM

## 2018-02-24 NOTE — Progress Notes (Signed)
Patients husband called to request if she can be on a higher dose of pain medications when she discharges.  Advised we will ask the MD tomorrow
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
## 2018-02-25 ENCOUNTER — Encounter: Payer: Self-pay | Admitting: Orthopedic Surgery

## 2018-02-25 MED ORDER — SODIUM CHLORIDE 0.9 % IV BOLUS
500.0000 mL | Freq: Once | INTRAVENOUS | Status: AC
  Administered 2018-02-25: 500 mL via INTRAVENOUS

## 2018-02-25 NOTE — Progress Notes (Signed)
Physical Therapy Treatment Patient Details Name: Jamie Schultz MRN: [id number] DOB: Dec 27, 1960 Today's Date: 02/25/2018    History of Present Illness 57 y/o s/p L TKA 6/26.    PT Comments    Pt is able to ambulate with good confidence, though is consistently impulsive and needing heavy cuing and repeated explanation about limitations and safety considerations now and when at home.  Pt negotiated up/down steps w/o issue and generally showed good ability to perform tasks, great ROM and strength and overall is progressing well when she is able to stay on task.   Follow Up Recommendations  Home health PT     Equipment Recommendations  Rolling walker with 5" wheels    Recommendations for Other Services       Precautions / Restrictions Precautions Precautions: Fall;Knee Restrictions Weight Bearing Restrictions: Yes LLE Weight Bearing: Weight bearing as tolerated    Mobility  Bed Mobility Overal bed mobility: Independent             General bed mobility comments: (easily gets to sitting at EOB w/o assist)  Transfers Overall transfer level: Independent Equipment used: Rolling walker (2 wheeled)             General transfer comment: independent, no assist required, but impulsive, requires supervision for safety able to maintain balance w/o AD  Ambulation/Gait Ambulation/Gait assistance: Supervision Gait Distance (Feet): 250 Feet Assistive device: Rolling walker (2 wheeled)       General Gait Details: Pt able to ambulate with good confidence and no limp, though she did at times need reminders to use walker appropriately and to stay on task.     Stairs Stairs: Yes Stairs assistance: Supervision Stair Management: One rail Right;Alternating pattern;Forwards Number of Stairs: 8 General stair comments: Pt impulsively negotiated steps reciprocally first attempt, PT cued her on step-to pattern which she stated was much less pain...   Wheelchair Mobility     Modified Rankin (Stroke Patients Only)       Balance Overall balance assessment: Independent(Pt impulsive t/o session but able to maintain balance w/o AD)                                          Cognition Arousal/Alertness: Awake/alert Behavior During Therapy: WFL for tasks assessed/performed;Impulsive;Agitated Overall Cognitive Status: Within Functional Limits for tasks assessed                                 General Comments: max verbal cues to redirect and stay on task, very tangential, but overall [facility]      Exercises Total Joint Exercises Ankle Circles/Pumps: AROM;10 reps Quad Sets: Strengthening;10 reps Gluteal Sets: Strengthening;10 reps Short Arc Quad: Strengthening;10 reps Heel Slides: Strengthening;10 reps Hip ABduction/ADduction: Strengthening;10 reps Straight Leg Raises: AROM;10 reps Knee Flexion: AROM;10 reps Goniometric ROM: 0-120    General Comments        Pertinent Vitals/Pain Pain Assessment: 0-10 Pain Score: 6     Home Living                      Prior Function            PT Goals (current goals can now be found in the care plan section) Progress towards PT goals: Progressing toward goals    Frequency    BID  PT Plan Current plan remains appropriate    Co-evaluation              AM-PAC PT "6 Clicks" Daily Activity  Outcome Measure  Difficulty turning over in bed (including adjusting bedclothes, sheets and blankets)?: None Difficulty moving from lying on back to sitting on the side of the bed? : None Difficulty sitting down on and standing up from a chair with arms (e.g., wheelchair, bedside commode, etc,.)?: None Help needed moving to and from a bed to chair (including a wheelchair)?: None Help needed walking in hospital room?: None Help needed climbing 3-5 steps with a railing? : None 6 Click Score: 24    End of Session Equipment Utilized During Treatment: Gait  belt Activity Tolerance: Patient tolerated treatment well;Patient limited by pain Patient left: with chair alarm set;with call bell/phone within reach Nurse Communication: Mobility status PT Visit Diagnosis: Muscle weakness (generalized) (M62.81);Difficulty in walking, not elsewhere classified (R26.2)     Time: 0349-1791 PT Time Calculation (min) (ACUTE ONLY): 26 min  Charges:  $Gait Training: 8-22 mins $Therapeutic Exercise: 8-22 mins                     Kreg Shropshire, DPT 02/25/2018, 10:15 AM

## 2018-02-25 NOTE — Progress Notes (Signed)
Paged Dr. Hessie Knows for BP 74/48. Patient was complaining of being dizzy and eyes rolling with slurred speech.  500 NS bolus ordered and administered. After arousing patient she said shes feeling a little better.  BP rechecked 98/60. Will cont to monitor

## 2018-02-25 NOTE — Progress Notes (Signed)
Was told from previous shift that she was hiding medications in her surgical dressing wrap.  Combing through patients room now for hidden medications

## 2018-02-25 NOTE — Progress Notes (Signed)
Subjective: 2 Days Post-Op Procedure(s) (LRB): COMPUTER ASSISTED TOTAL KNEE ARTHROPLASTY (Left) Patient reports pain as moderate.  However patient does not appear to be in any discomfort during this visit.  Patient wanting stronger pain medicines to go home. Patient discussing who she should contact about the pain clinic. Patient had a hypotension episode last night and was given a bolus.  Has returned to normal status.  Hypotension felt to be secondary to possibility of medication given to her by her husband.  According to the patient he is brought in Xanax from home.   patient was highness under her dressing on operative leg. Patient is well, and has had no acute complaints or problems Patient did very well with therapy yesterday.  Was able to ambulate around the nurses test and had 110 degrees of flexion.  Still needs the stairs prior to being discharged Plan is to go Home after hospital stay. no nausea and no vomiting Patient denies any chest pains or shortness of breath. Objective: Vital signs in last 24 hours: Temp:  [97.8 F (36.6 C)-98.6 F (37 C)] 98.3 F (36.8 C) (06/28 0730) Pulse Rate:  [71-100] 89 (06/28 0730) Resp:  [8-18] 18 (06/28 0730) BP: (74-132)/(48-102) 132/102 (06/28 0730) SpO2:  [76 %-100 %] 96 % (06/28 0730) well approximated incision Heels are non tender and elevated off the bed using rolled towels Intake/Output from previous day: 06/27 0701 - 06/28 0700 In: 2648.3 [P.O.:1080; I.V.:1568.3] Out: 95 [Drains:95] Intake/Output this shift: No intake/output data recorded.  No results for input(s): HGB in the last 72 hours. No results for input(s): WBC, RBC, HCT, PLT in the last 72 hours. No results for input(s): NA, K, CL, CO2, BUN, CREATININE, GLUCOSE, CALCIUM in the last 72 hours. No results for input(s): LABPT, INR in the last 72 hours.  EXAM General - Patient is Alert, Appropriate and Oriented Extremity - Neurologically intact Neurovascular  intact Sensation intact distally Intact pulses distally Dorsiflexion/Plantar flexion intact No cellulitis present Compartment soft Dressing - dressing C/D/I Motor Function - intact, moving foot and toes well on exam.    Past Medical History:  Diagnosis Date  . Abscess   . ADHD   . Allergy   . Anxiety   . Arthritis   . Bipolar disorder ([place])   . Chronic back pain   . Congenital prolapsed rectum   . COPD (chronic obstructive pulmonary disease) ([place])   . Fibromyalgia   . History of kidney stones   . Kidney failure    acute - reaction to sulfa drugs  . MRSA (methicillin resistant staph aureus) culture positive    Hx: of  . Muscle spasm    arms  . Muscle spasms of both lower extremities   . Numbness   . Panic attack   . Pneumonia    2009  . Wears dentures    full upper and lower    Assessment/Plan: 2 Days Post-Op Procedure(s) (LRB): COMPUTER ASSISTED TOTAL KNEE ARTHROPLASTY (Left) Active Problems:   S/P total knee arthroplasty  Estimated body mass index is 25 kg/m as calculated from the following:   Height as of this encounter: 5' (1.524 m).   Weight as of this encounter: 58.1 kg (128 lb). Up with therapy Discharge home with home health after patient does stairs this morning  Labs: None DVT Prophylaxis - Lovenox, Foot Pumps and TED hose Weight-Bearing as tolerated to left leg Please wash operative leg, change dressing and apply TED stockings to both legs prior to being  discharged Patient states that she has had a bowel movement but has not been recorded. Hemovac was discontinued today.  Ends of the drain appeared to be intact.  Jillyn Ledger. [place] 02/25/2018, 7:59 AM

## 2018-02-25 NOTE — Care Management (Addendum)
Patient to be discharged per MD order. PT ordered and previously set up with [facility]. Confirmed this with patient and she is in agreement for these services. Also, DME orders in place for walker and bsc. Spoke to [facility] and these will be delivered. Spoke with Helene Kelp from [facility] who has set up services. Family to provide transport. Ines Bloomer RN BSN RNCM [phone]

## 2018-02-25 NOTE — Progress Notes (Signed)
Pt ready for d/c home today after PT per MD. Pt met PT goals, equipment was delivered to pt's room. Dressing changed to left knee per order and 2 extras sent with pt. PIV removed, VSS. Discharge instructions and prescriptions reviewed with pt and husband, all questions answered. Pt assisted to car via Therapist, sports.   [place], Jerry Caras

## 2018-03-03 ENCOUNTER — Telehealth: Payer: Self-pay | Admitting: Licensed Clinical Social Worker

## 2018-03-03 NOTE — Telephone Encounter (Signed)
EMMI flagged patient for answering yes to loss of interest in things and yes to feeling sad/hopeless/anxous/empty. Clinical Education officer, museum (CSW) contacted patient via telephone. Per patient she is in a lot of pain and ran out of her oxycodone. Per patient PA Vance Peper would not refill her prescription. Patient reported that she has an appointment with a pain clinic in August 2019 and goes to psychiatrist Dr. Collie Siad in [place]. Per patient she has an appointment with Dr. Collie Siad next week. CSW provided emotional support. Patient reported that she is feeling sad because of the pain. Patient reported that she is not having thoughts of hurting herself and has a strong faith. Patient reported that she will continue to try to call [facility]. Patient reported no other needs or concerns.   [employer], LCSW [phone]

## 2018-04-11 ENCOUNTER — Ambulatory Visit: Payer: Medicaid Other | Admitting: Family Medicine

## 2018-04-13 ENCOUNTER — Ambulatory Visit: Payer: Medicaid Other | Admitting: Internal Medicine

## 2018-04-14 ENCOUNTER — Encounter: Payer: Self-pay | Admitting: Internal Medicine

## 2018-04-18 ENCOUNTER — Emergency Department: Payer: Medicaid Other

## 2018-04-18 ENCOUNTER — Emergency Department
Admission: EM | Admit: 2018-04-18 | Discharge: 2018-04-18 | Disposition: A | Discharge status: Home / Self Care | Payer: Medicaid Other | Attending: Emergency Medicine | Admitting: Emergency Medicine

## 2018-04-18 DIAGNOSIS — F17210 Nicotine dependence, cigarettes, uncomplicated: Secondary | ICD-10-CM | POA: Insufficient documentation

## 2018-04-18 DIAGNOSIS — W01198A Fall on same level from slipping, tripping and stumbling with subsequent striking against other object, initial encounter: Secondary | ICD-10-CM | POA: Insufficient documentation

## 2018-04-18 DIAGNOSIS — Y939 Activity, unspecified: Secondary | ICD-10-CM | POA: Diagnosis not present

## 2018-04-18 DIAGNOSIS — Y998 Other external cause status: Secondary | ICD-10-CM | POA: Insufficient documentation

## 2018-04-18 DIAGNOSIS — M25562 Pain in left knee: Secondary | ICD-10-CM | POA: Diagnosis not present

## 2018-04-18 DIAGNOSIS — J449 Chronic obstructive pulmonary disease, unspecified: Secondary | ICD-10-CM | POA: Diagnosis not present

## 2018-04-18 DIAGNOSIS — E039 Hypothyroidism, unspecified: Secondary | ICD-10-CM | POA: Diagnosis not present

## 2018-04-18 DIAGNOSIS — Z79899 Other long term (current) drug therapy: Secondary | ICD-10-CM | POA: Diagnosis not present

## 2018-04-18 DIAGNOSIS — S0990XA Unspecified injury of head, initial encounter: Secondary | ICD-10-CM | POA: Insufficient documentation

## 2018-04-18 DIAGNOSIS — Y929 Unspecified place or not applicable: Secondary | ICD-10-CM | POA: Insufficient documentation

## 2018-04-18 DIAGNOSIS — W19XXXA Unspecified fall, initial encounter: Secondary | ICD-10-CM

## 2018-04-18 DIAGNOSIS — E785 Hyperlipidemia, unspecified: Secondary | ICD-10-CM | POA: Insufficient documentation

## 2018-04-18 LAB — CBC WITH DIFFERENTIAL/PLATELET
Basophils Absolute: 0.1 10*3/uL (ref 0–0.1)
Basophils Relative: 1 %
EOS PCT: 3 %
Eosinophils Absolute: 0.3 10*3/uL (ref 0–0.7)
HCT: 37.4 % (ref 35.0–47.0)
Hemoglobin: 12.6 g/dL (ref 12.0–16.0)
LYMPHS PCT: 28 %
Lymphs Abs: 2.7 10*3/uL (ref 1.0–3.6)
MCH: 31 pg (ref 26.0–34.0)
MCHC: 33.8 g/dL (ref 32.0–36.0)
MCV: 91.5 fL (ref 80.0–100.0)
Monocytes Absolute: 0.7 10*3/uL (ref 0.2–0.9)
Monocytes Relative: 7 %
Neutro Abs: 5.9 10*3/uL (ref 1.4–6.5)
Neutrophils Relative %: 61 %
PLATELETS: 362 10*3/uL (ref 150–440)
RBC: 4.09 MIL/uL (ref 3.80–5.20)
RDW: 15.4 % — ABNORMAL HIGH (ref 11.5–14.5)
WBC: 9.6 10*3/uL (ref 3.6–11.0)

## 2018-04-18 LAB — COMPREHENSIVE METABOLIC PANEL
ALK PHOS: 65 U/L (ref 38–126)
ALT: 16 U/L (ref 0–44)
AST: 33 U/L (ref 15–41)
Albumin: 3.9 g/dL (ref 3.5–5.0)
Anion gap: 7 (ref 5–15)
BUN: 10 mg/dL (ref 6–20)
CHLORIDE: 110 mmol/L (ref 98–111)
CO2: 23 mmol/L (ref 22–32)
Calcium: 8.7 mg/dL — ABNORMAL LOW (ref 8.9–10.3)
Creatinine, Ser: 0.64 mg/dL (ref 0.44–1.00)
GFR calc Af Amer: >60 mL/min (ref >60)
Glucose, Bld: 91 mg/dL (ref 70–99)
Potassium: 3.8 mmol/L (ref 3.5–5.1)
Sodium: 140 mmol/L (ref 135–145)
Total Bilirubin: 0.9 mg/dL (ref 0.3–1.2)
Total Protein: 6.9 g/dL (ref 6.5–8.1)

## 2018-04-18 LAB — TROPONIN I: Troponin I: <0.03 ng/mL

## 2018-04-18 MED ORDER — OXYCODONE HCL 5 MG PO CAPS: 5.0000 mg | ORAL_CAPSULE | ORAL | 0 refills | Status: DC | PRN

## 2018-04-18 MED ORDER — HALOPERIDOL LACTATE 5 MG/ML IJ SOLN
5.0000 mg | Freq: Once | INTRAMUSCULAR | Status: AC
  Administered 2018-04-18: 5 mg via INTRAVENOUS

## 2018-04-18 MED ORDER — ONDANSETRON HCL 4 MG/2ML IJ SOLN
4.0000 mg | Freq: Once | INTRAMUSCULAR | Status: AC
Start: 2018-04-18 — End: 2018-04-18
  Administered 2018-04-18: 4 mg via INTRAVENOUS
  Filled 2018-04-18: qty 2

## 2018-04-18 MED ORDER — HALOPERIDOL LACTATE 5 MG/ML IJ SOLN
INTRAMUSCULAR | Status: AC
  Administered 2018-04-18: 5 mg via INTRAVENOUS
  Filled 2018-04-18: qty 1

## 2018-04-18 MED ORDER — MORPHINE SULFATE (PF) 4 MG/ML IV SOLN
4.0000 mg | Freq: Once | INTRAVENOUS | Status: AC
  Administered 2018-04-18: 4 mg via INTRAVENOUS
  Filled 2018-04-18: qty 1

## 2018-04-18 NOTE — ED Notes (Addendum)
Pt to CT. ABCs intact. NAD 

## 2018-04-18 NOTE — ED Notes (Signed)
Pt returned from CT & XR. ABCs intact. NAD.

## 2018-04-18 NOTE — ED Triage Notes (Signed)
Pt BIB [facility] from home s/p mechanical fall. Pt recently had L knee replacement, tripped over some boxes, twisted L knee and fell to the ground. Pt did hit head. Pt did not lose consciousness. Pain 10/10 to L knee. Pt not on blood thinners. Upon arrival, pt alert & oriented x4. ABCs intact.
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
## 2018-04-18 NOTE — Discharge Instructions (Signed)
Please call Dr. Marry Guan in the morning.  Let him know he fell you are having knee pain.  He can follow-up with you to make sure everything is still doing well.  Use your crutches knee immobilizer in the meantime.  Take the oxycodone 1 to 2 pills 4 times a day as needed for pain.  Be careful I can make you sleepy and constipated.  Do not drive on them.  Please return if you are worse.

## 2018-04-18 NOTE — ED Notes (Signed)
Per Cassie RN, Haldol 5mg  given.

## 2018-04-20 ENCOUNTER — Other Ambulatory Visit: Payer: Self-pay | Admitting: Internal Medicine

## 2018-04-20 DIAGNOSIS — J432 Centrilobular emphysema: Secondary | ICD-10-CM

## 2018-05-03 ENCOUNTER — Other Ambulatory Visit: Payer: Self-pay | Admitting: Student

## 2018-05-03 DIAGNOSIS — M5416 Radiculopathy, lumbar region: Secondary | ICD-10-CM

## 2018-05-03 DIAGNOSIS — M545 Low back pain: Secondary | ICD-10-CM

## 2018-05-12 ENCOUNTER — Ambulatory Visit
Admission: RE | Admit: 2018-05-12 | Discharge: 2018-05-12 | Disposition: A | Discharge status: Home / Self Care | Payer: Medicaid Other | Source: Ambulatory Visit | Attending: Student | Admitting: Student

## 2018-05-12 DIAGNOSIS — N281 Cyst of kidney, acquired: Secondary | ICD-10-CM | POA: Diagnosis not present

## 2018-05-12 DIAGNOSIS — Z981 Arthrodesis status: Secondary | ICD-10-CM | POA: Diagnosis not present

## 2018-05-12 DIAGNOSIS — M47816 Spondylosis without myelopathy or radiculopathy, lumbar region: Secondary | ICD-10-CM | POA: Insufficient documentation

## 2018-05-12 DIAGNOSIS — M545 Low back pain: Secondary | ICD-10-CM

## 2018-05-12 DIAGNOSIS — M8938 Hypertrophy of bone, other site: Secondary | ICD-10-CM | POA: Diagnosis not present

## 2018-05-12 DIAGNOSIS — M48061 Spinal stenosis, lumbar region without neurogenic claudication: Secondary | ICD-10-CM | POA: Insufficient documentation

## 2018-05-12 DIAGNOSIS — M5416 Radiculopathy, lumbar region: Secondary | ICD-10-CM

## 2018-05-19 IMAGING — MR MR HUMERUS*L* W/O CM
5 of 7 series · 29 of 40 positions shown · non-contrast
Comparison: Left shoulder x-rays dated September 29, 2016.

CLINICAL DATA: Increased left arm pain, swelling, and bruising.
History of prior left reverse total shoulder arthroplasty.

EXAM:
MRI OF THE LEFT HUMERUS WITHOUT CONTRAST
TECHNIQUE: Multiplanar, multisequence MR imaging of the left humerus was
performed. No intravenous contrast was administered.

[Series 5: T1 · axial · 5.0mm · 1.19mm/px · z∈[-38,+142]mm · 6 of 25 slices shown (1 of 3)]
[im 1/25]
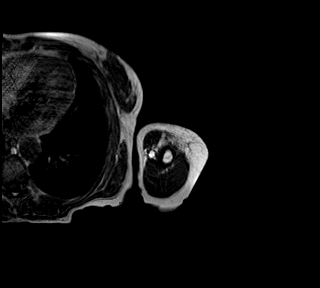
[im 5/25]
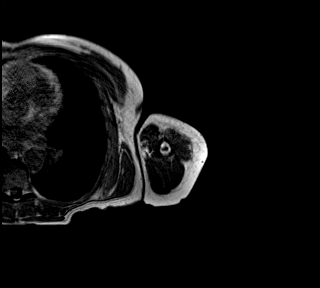
[im 10/25]
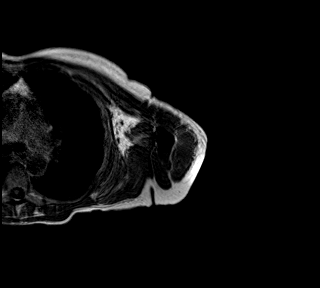
[im 15/25]
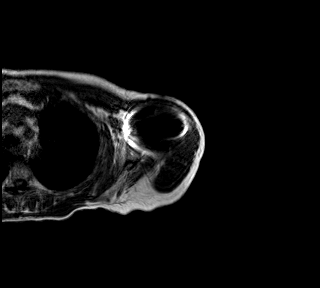
[im 20/25]
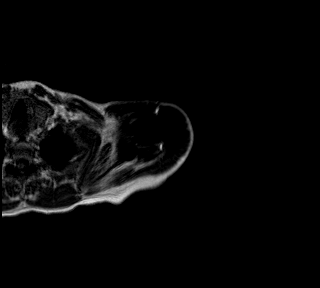
[im 25/25]
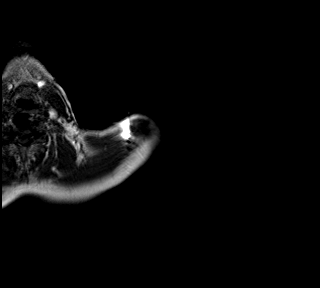

[Series 6: T1 · axial · 5.0mm · 1.19mm/px · z∈[-223,-46]mm · 5 of 25 slices shown (2 of 3)]
[im 1/25]
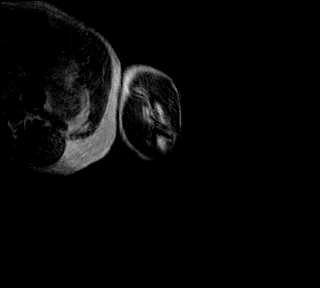
[im 7/25]
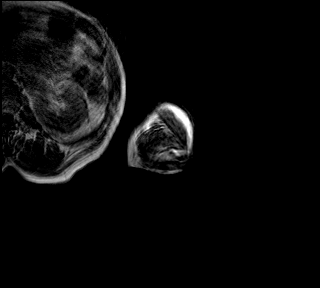
[im 13/25]
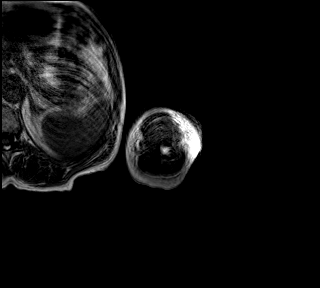
[im 19/25]
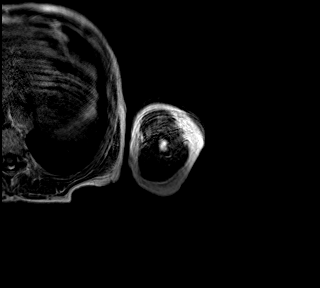
[im 25/25]
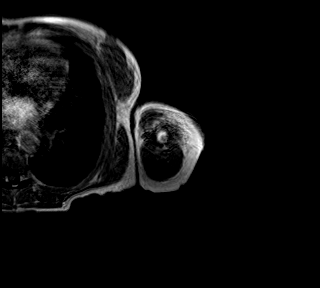

[Series 7: T2 fat-sat · axial · 5.0mm · 0.74mm/px · z∈[-234,+125]mm · 8 of 50 slices shown (1 of 2)]
[im 1/50]
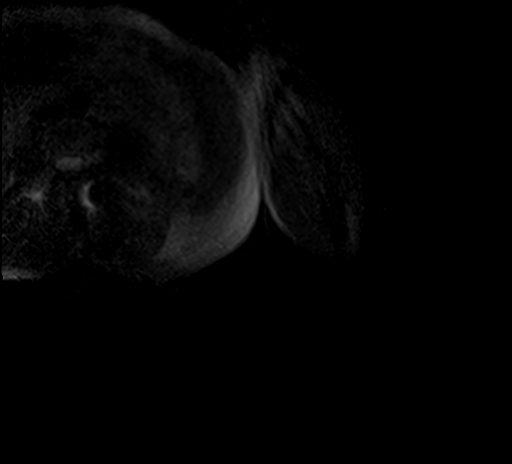
[im 6/50]
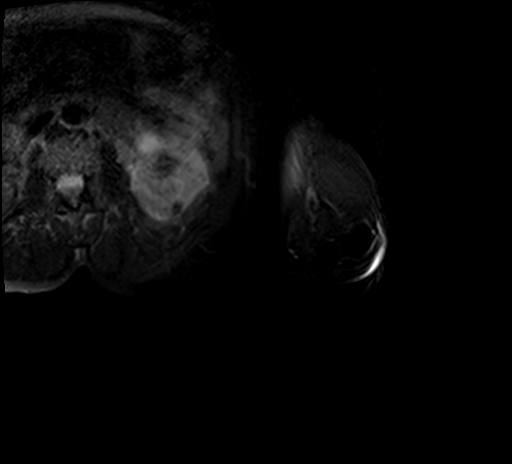
[im 17/50]
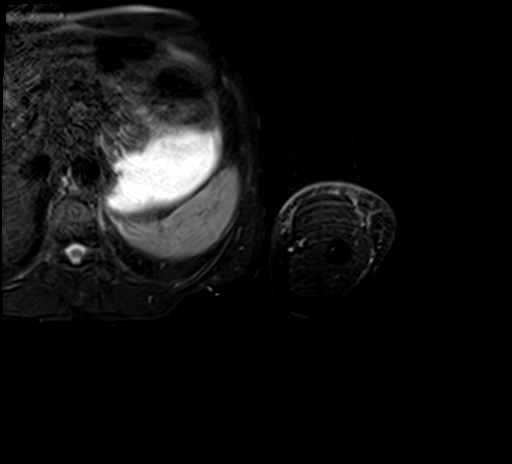
[im 22/50]
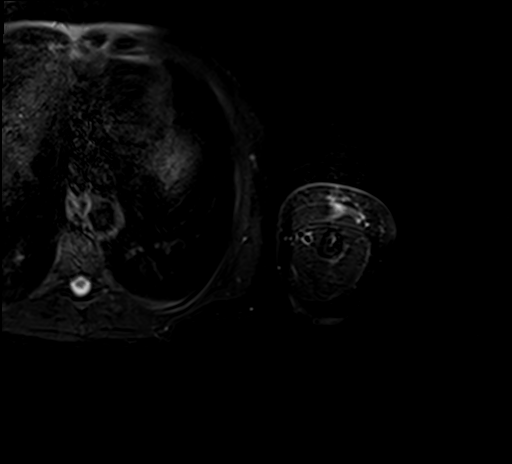
[im 28/50]
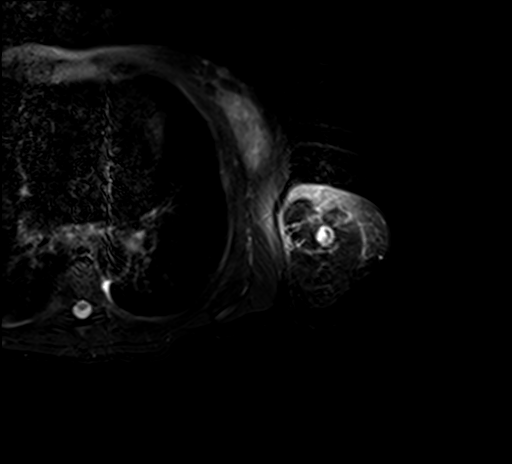
[im 33/50]
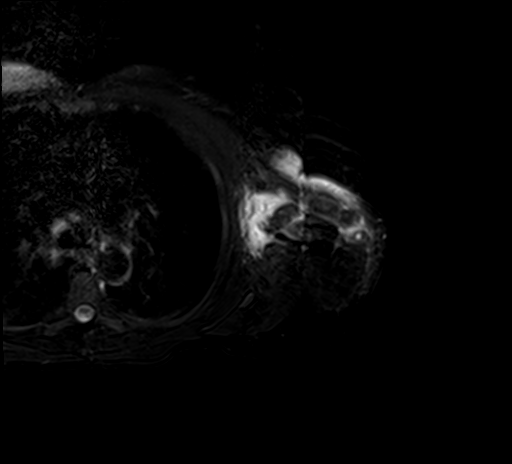
[im 44/50]
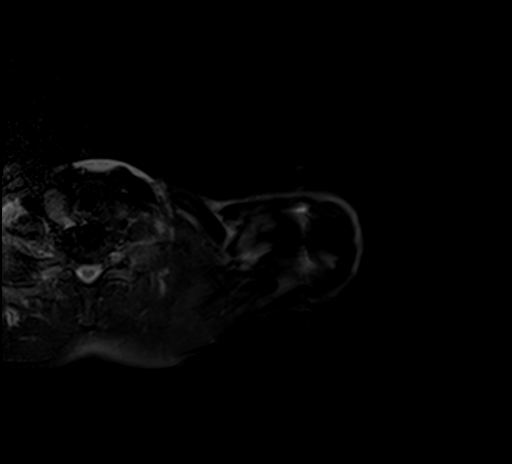
[im 50/50]
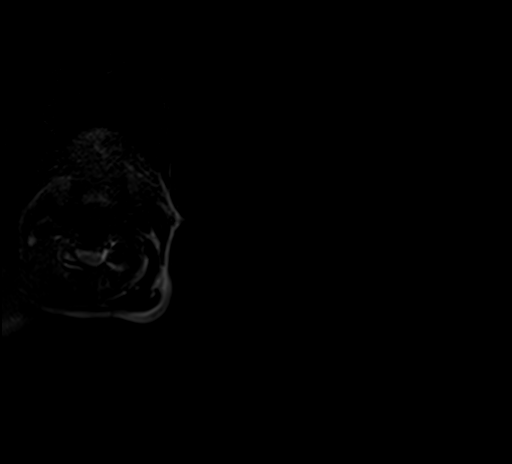

[Series 8: T1 · coronal · 4.0mm · 1.04mm/px · 5 of 27 slices shown (3 of 3)]
[im 1/27]
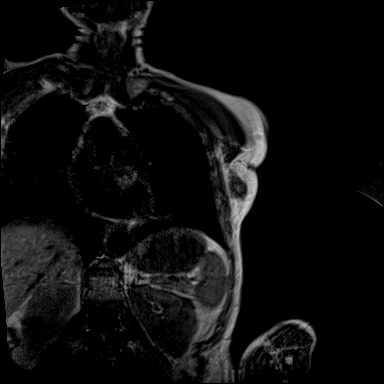
[im 7/27]
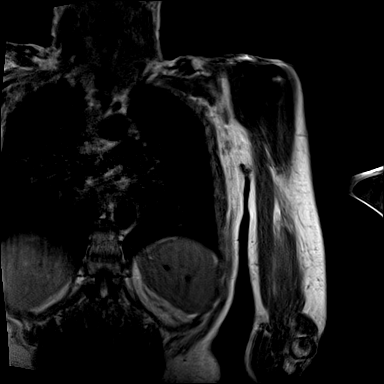
[im 14/27]
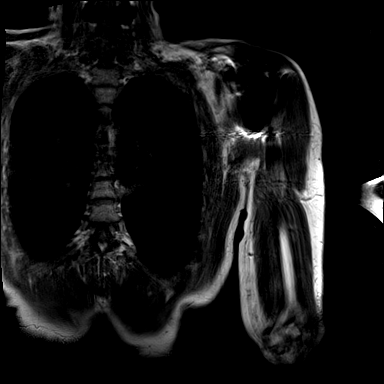
[im 20/27]
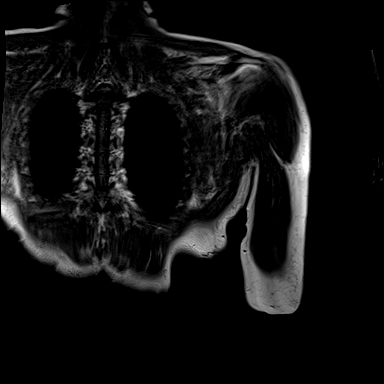
[im 27/27]
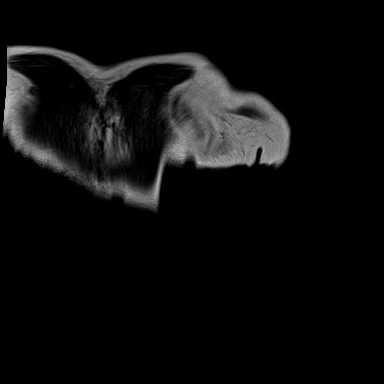

[Series 9: T2 fat-sat · coronal · 4.0mm · 0.78mm/px · 5 of 27 slices shown (2 of 2)]
[im 1/27]
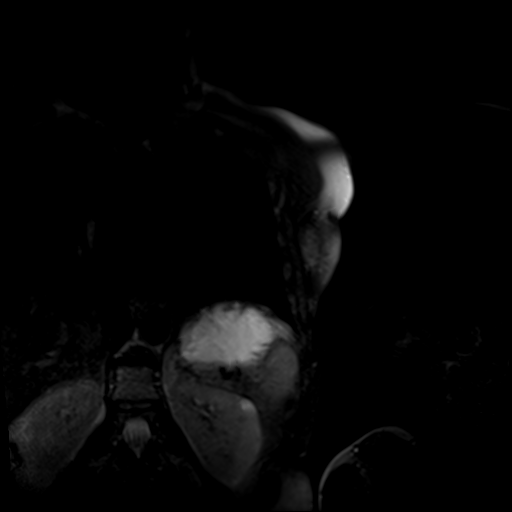
[im 7/27]
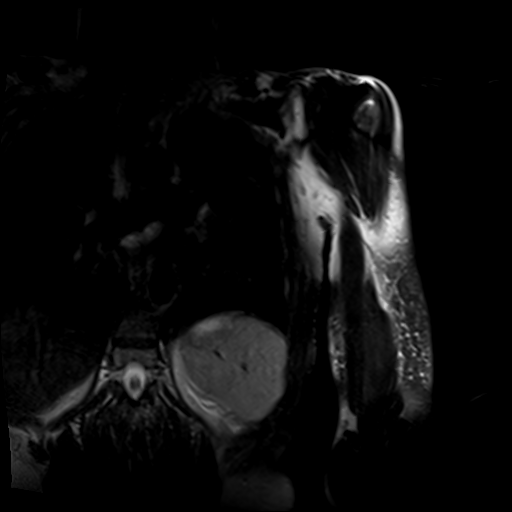
[im 14/27]
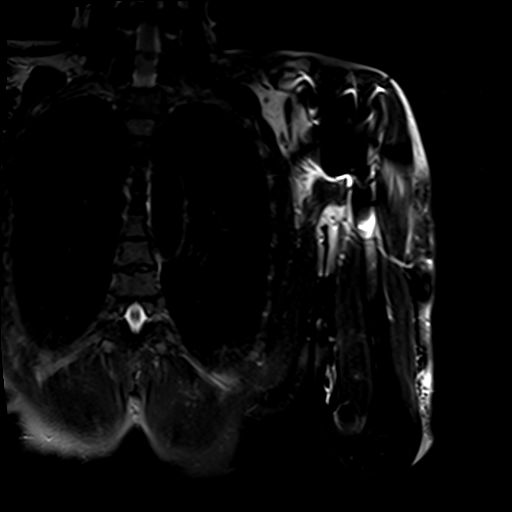
[im 20/27]
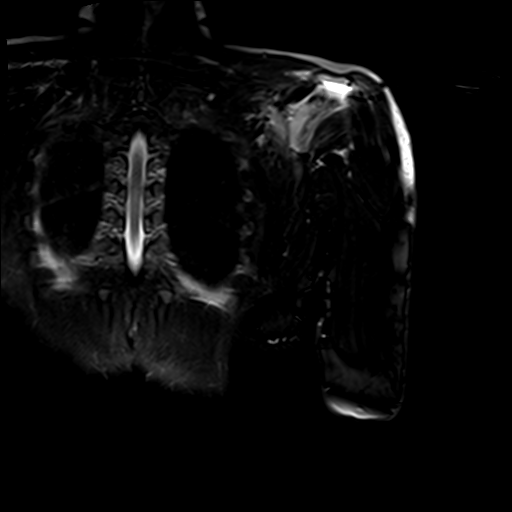
[im 27/27]
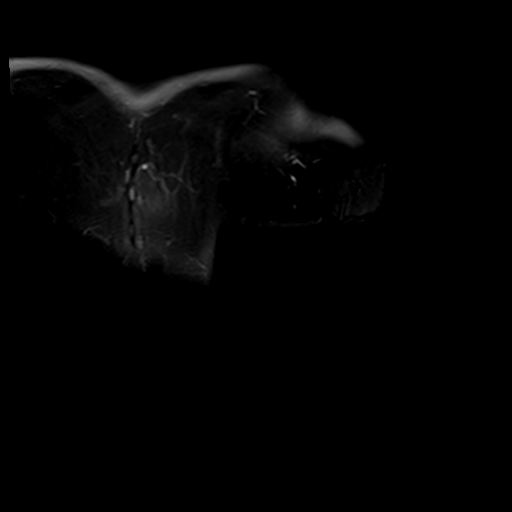

[29 of 40 positions shown; findings below may reference images not displayed]

FINDINGS: Susceptibility artifact related to reverse total shoulder
arthroplasty limits evaluation of the shoulder and upper arm.

Bones/Joint/Cartilage

Prior reverse total shoulder arthroplasty. No suspicious marrow
signal abnormality. No fracture or malalignment. No periosteal
reaction.

Muscles and Tendons

History of prior biceps tenodesis, with a small amount of edema
along the lateral aspect of the proximal biceps muscle. The
pectoralis major tendon appears intact.

Soft tissues

No soft tissue mass or fluid collection.  Bilateral renal cysts.
IMPRESSION: 1. Prior reverse total shoulder arthroplasty, with susceptibility
artifact limiting evaluation of the shoulder and upper arm. History
of prior biceps tenodesis with a small amount of edema along the
lateral aspect of the proximal biceps muscle, which could reflect
low-grade strain. No definite tendon tear.
2. No acute osseous abnormality. Consider further evaluation with
left shoulder x-rays for better evaluation of hardware as clinically
indicated.

## 2018-06-06 ENCOUNTER — Other Ambulatory Visit: Payer: Self-pay | Admitting: Neurology

## 2018-06-06 DIAGNOSIS — G8929 Other chronic pain: Secondary | ICD-10-CM

## 2018-06-06 DIAGNOSIS — M5442 Lumbago with sciatica, left side: Principal | ICD-10-CM

## 2018-06-06 DIAGNOSIS — M5441 Lumbago with sciatica, right side: Principal | ICD-10-CM

## 2018-06-06 NOTE — Telephone Encounter (Signed)
Per registry, Rx last filled on 02/16/18 for #90. She does not have a follow up visit scheduled. Last seen for a NCV on 09/15/17 but no office visit since 07/28/2016.

## 2018-06-22 ENCOUNTER — Ambulatory Visit: Payer: Medicaid Other | Admitting: Internal Medicine

## 2018-06-23 ENCOUNTER — Ambulatory Visit: Payer: Medicaid Other | Admitting: Internal Medicine

## 2018-06-28 ENCOUNTER — Ambulatory Visit: Payer: Medicaid Other | Admitting: Internal Medicine

## 2018-06-29 ENCOUNTER — Encounter: Payer: Self-pay | Admitting: Internal Medicine
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
## 2018-07-20 IMAGING — CR DG KNEE 3 VIEWS*L*
1 series · 3 of 3 positions shown · non-contrast
Comparison: 02/14/2016.

CLINICAL DATA: Left knee pain for the past 5 years.

EXAM:
LEFT KNEE - 3 VIEW

[Series 1: dg knee 3 views left · 0.14mm/px · 3 of 3 slices shown]
[im 1/3]
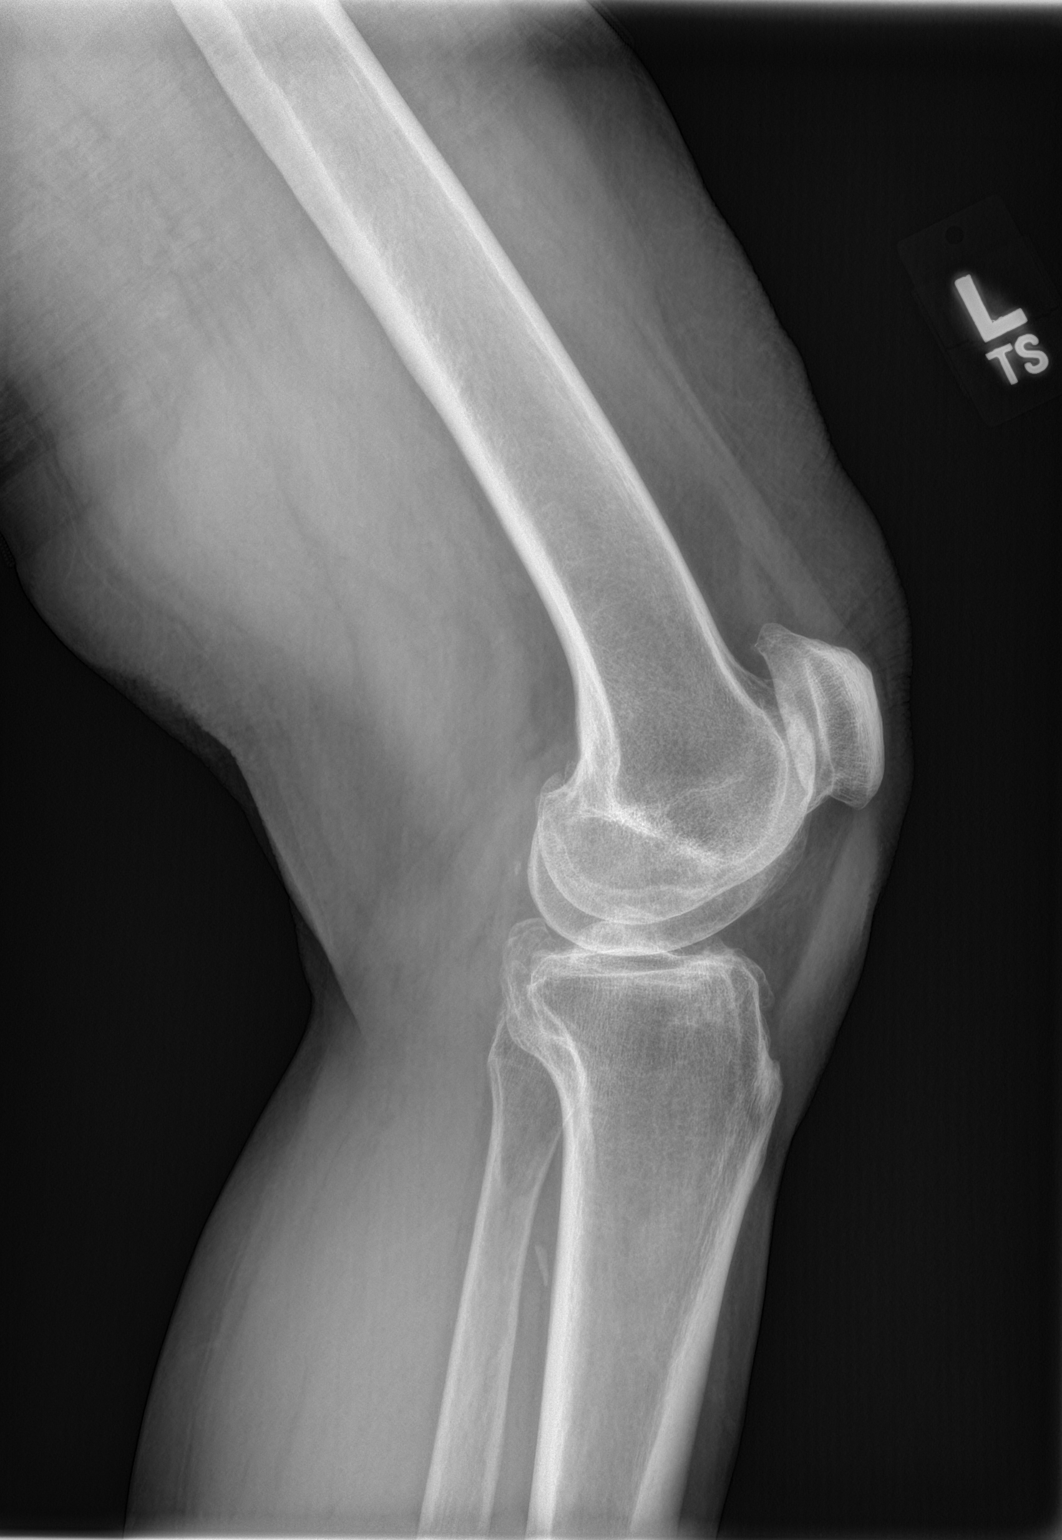
[im 2/3]
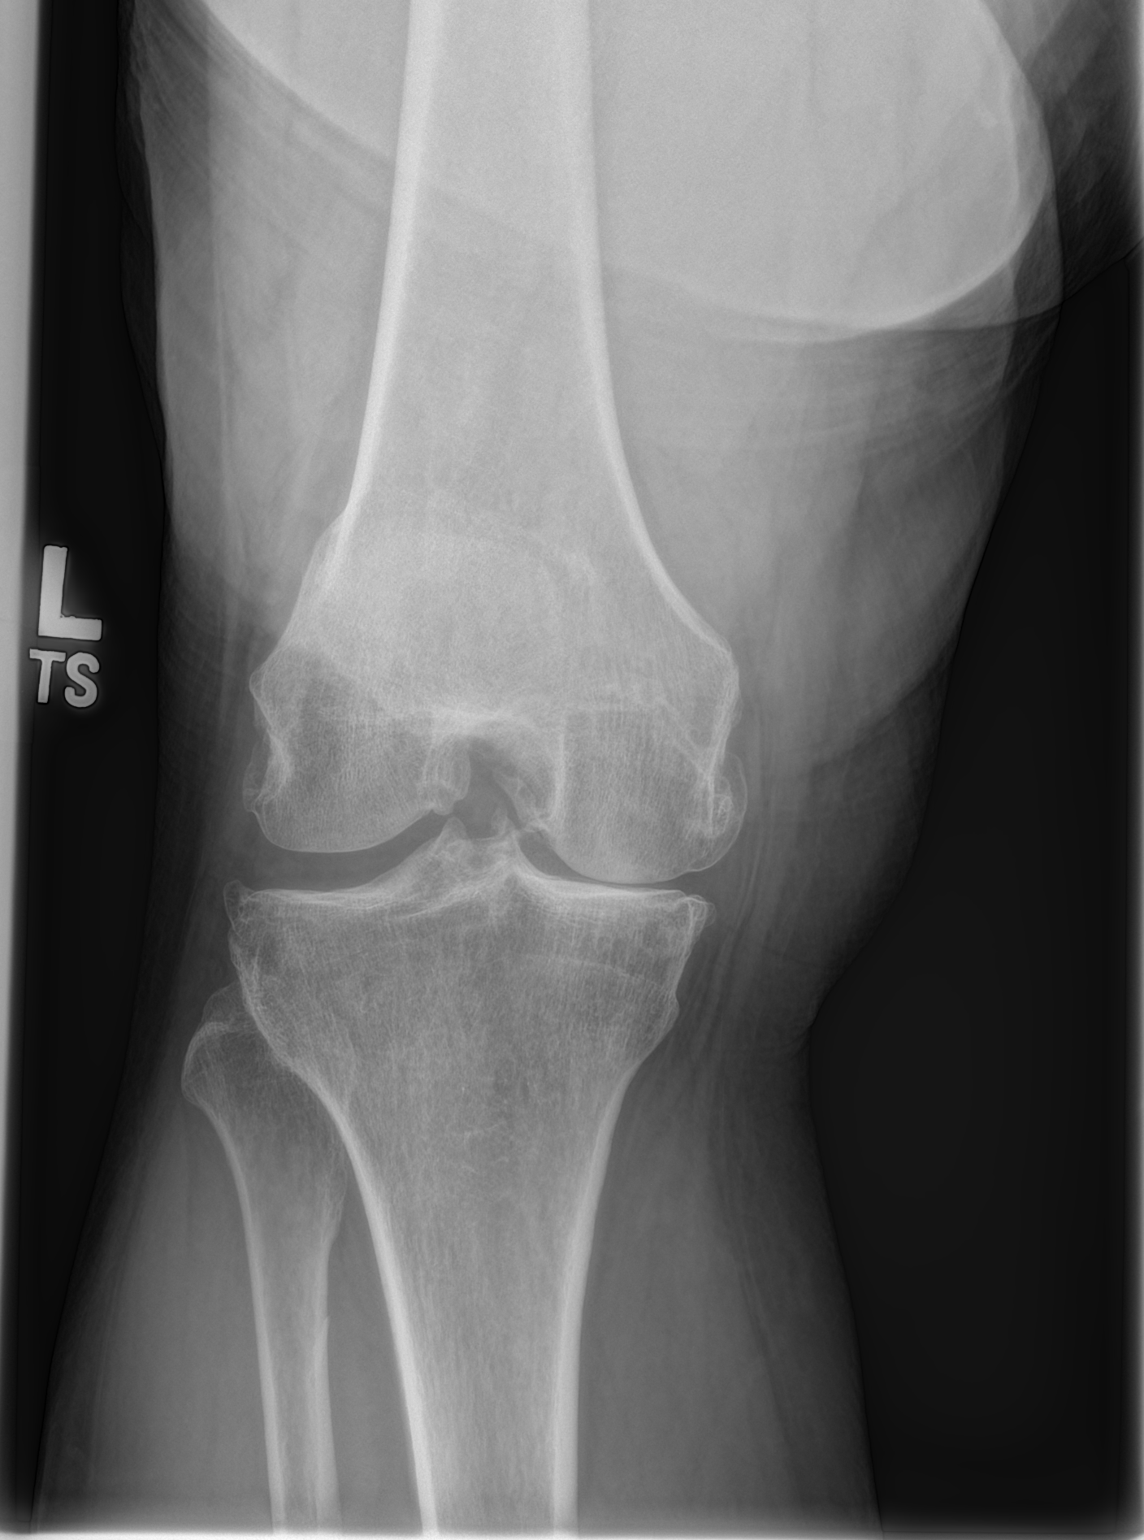
[im 3/3]
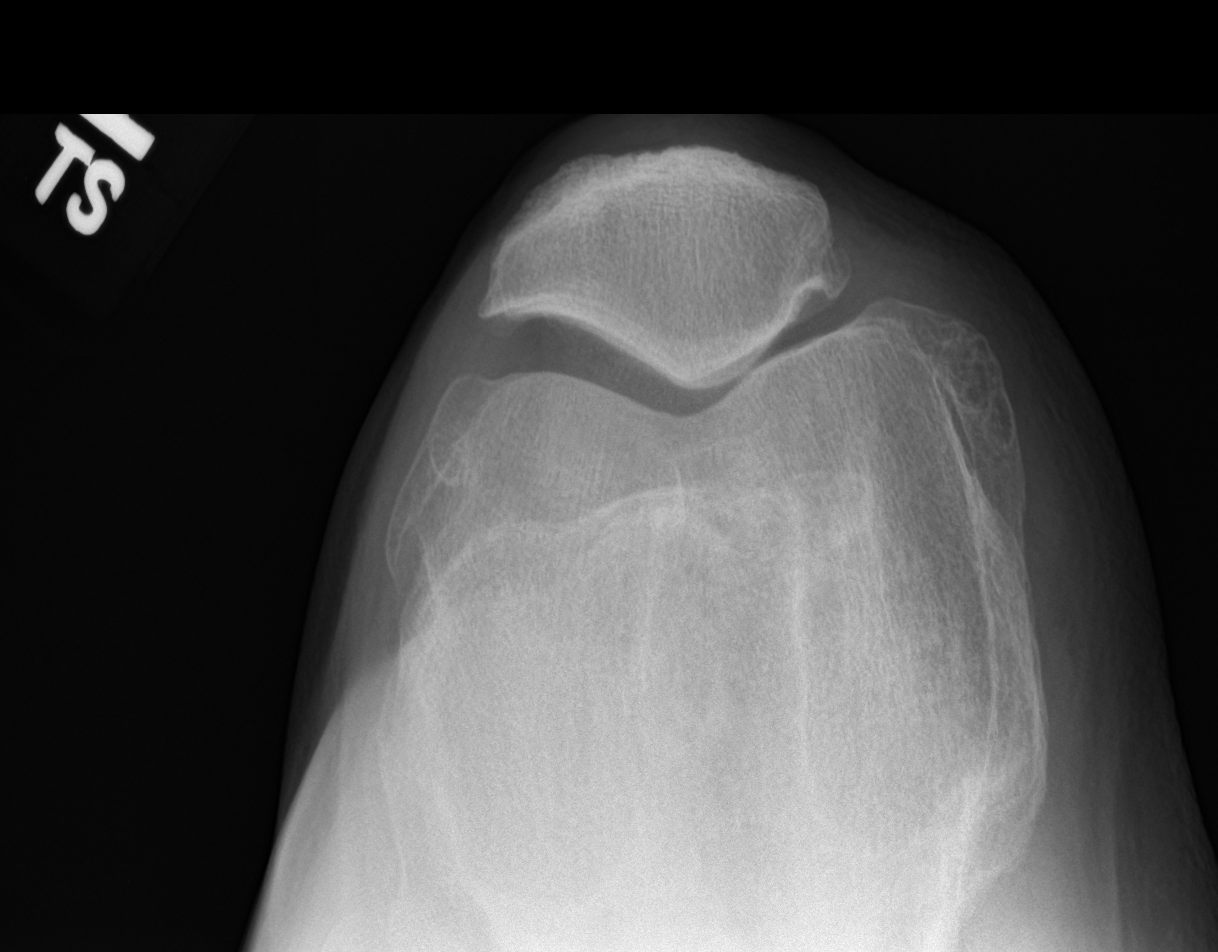

[3 of 3 positions shown; findings below may reference images not displayed]

FINDINGS: No significant change in tricompartmental spur formation and marked
medial joint space narrowing. Minimal effusion, decreased.
IMPRESSION: 1. Stable tricompartmental knee degenerative changes.
2. Minimal effusion, improved.

## 2018-08-03 IMAGING — US US EXTREM  UP VENOUS*L*
1 series · 13 of 24 positions shown · non-contrast
Comparison: None.

CLINICAL DATA: Acute onset of left arm pain and swelling.



[Series 1: us extrem up venous*left* · 0.08mm/px · 13 of 33 slices shown]
[im 1/33]
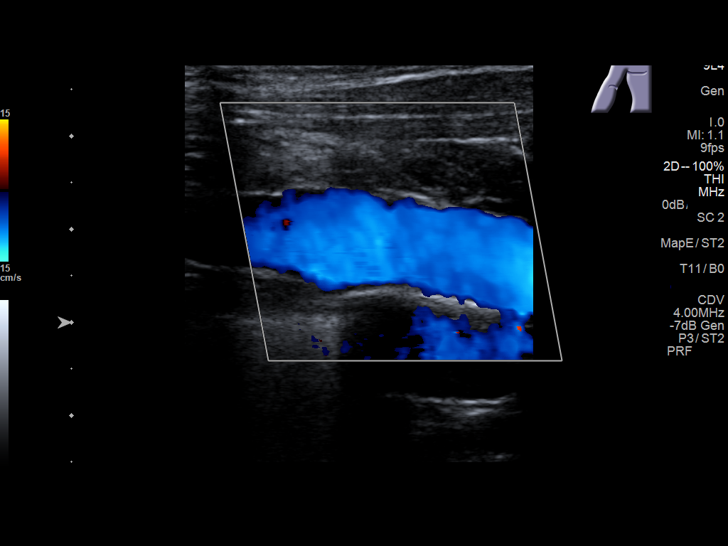
[im 3/33]
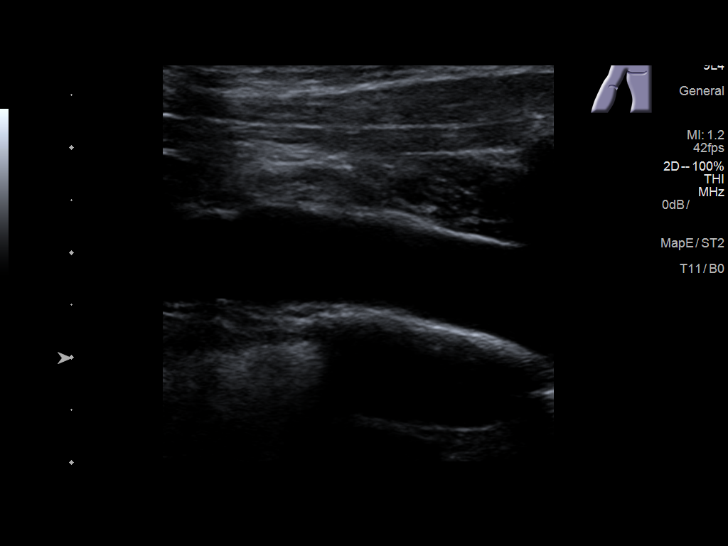
[im 6/33]
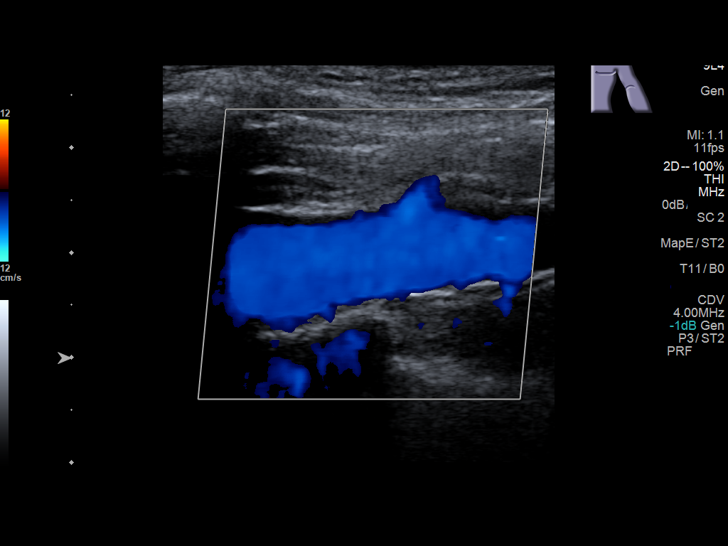
[im 9/33]
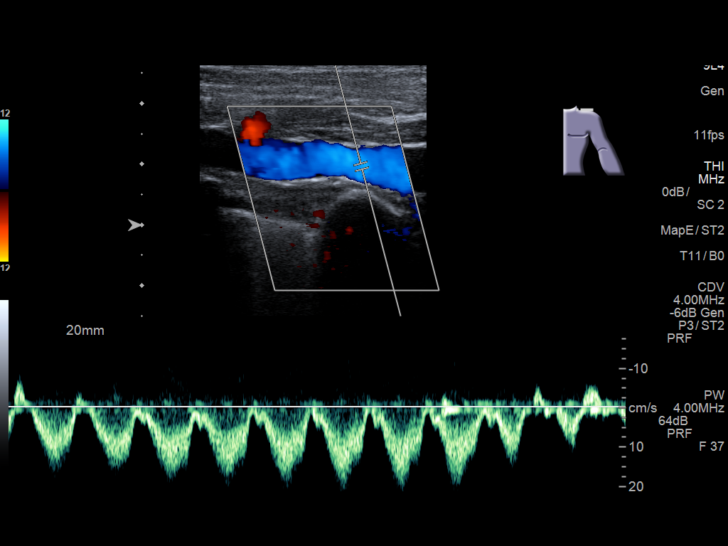
[im 12/33]
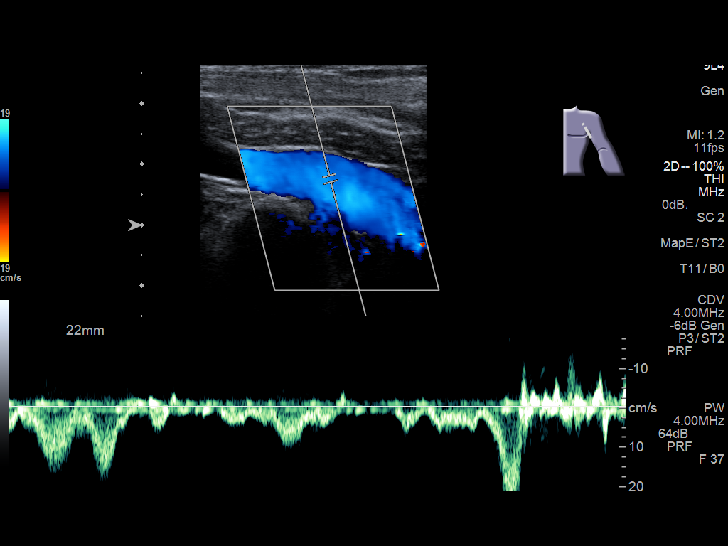
[im 14/33]
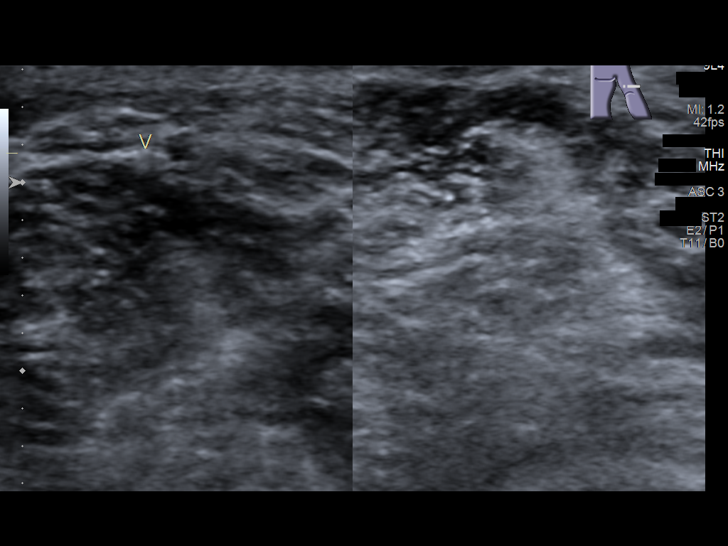
[im 17/33]
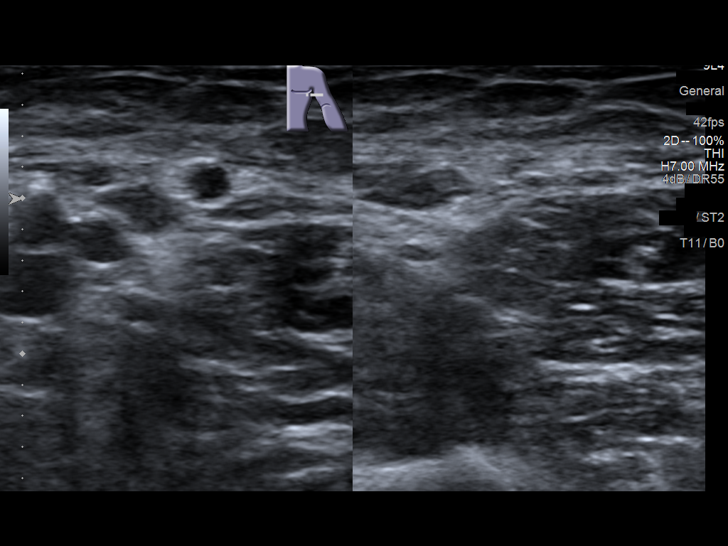
[im 19/33]
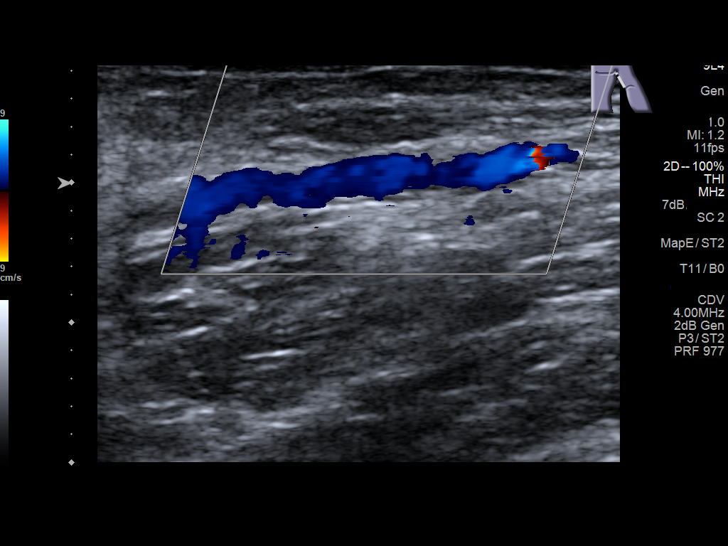
[im 21/33]
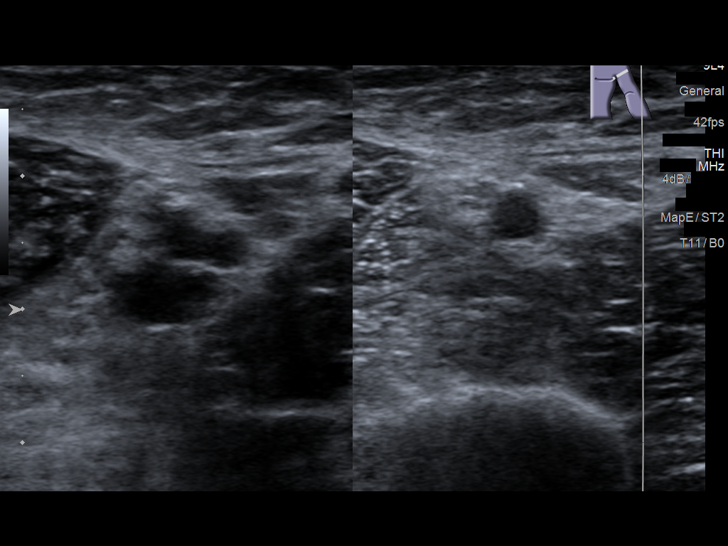
[im 24/33]
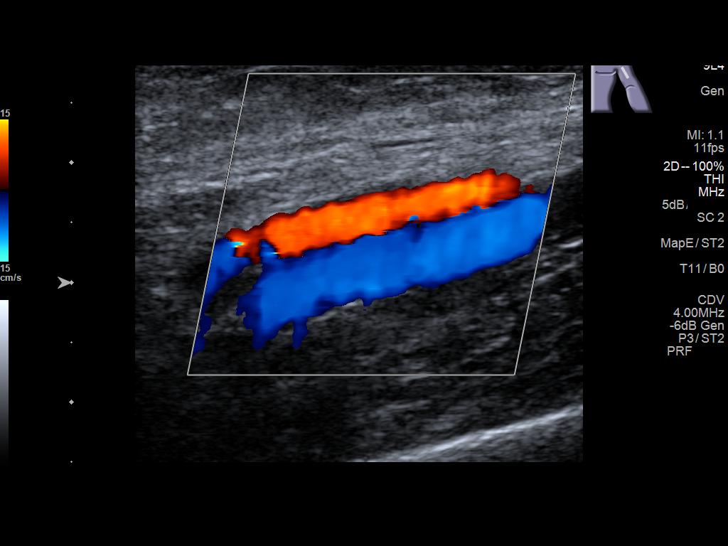
[im 27/33]
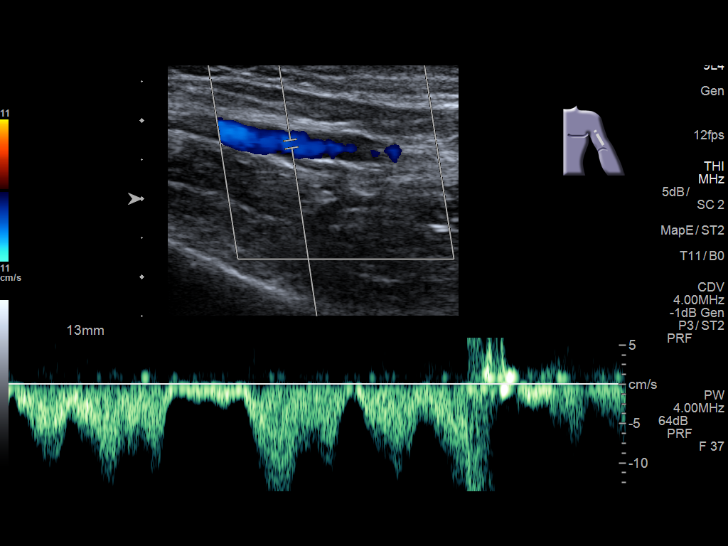
[im 30/33]
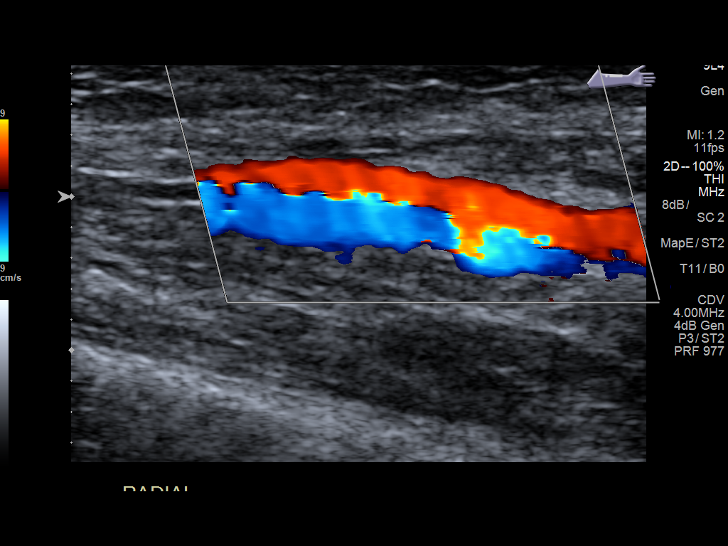
[im 33/33]
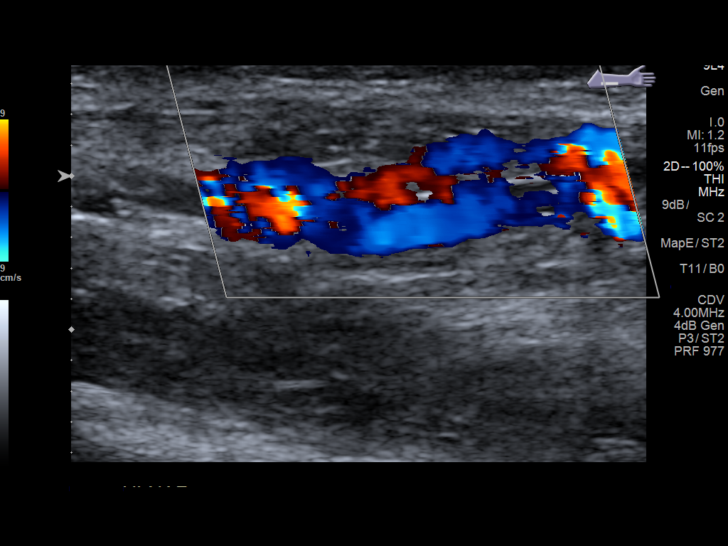

[13 of 24 positions shown; findings below may reference images not displayed]

FINDINGS: Contralateral Subclavian Vein: Respiratory phasicity is normal and
symmetric with the symptomatic side. No evidence of thrombus. Normal
compressibility.

Internal Jugular Vein: No evidence of thrombus. Normal
compressibility, respiratory phasicity and response to augmentation.

Subclavian Vein: No evidence of thrombus. Normal compressibility,
respiratory phasicity and response to augmentation.

Axillary Vein: No evidence of thrombus. Normal compressibility,
respiratory phasicity and response to augmentation.

Cephalic Vein: No evidence of thrombus. Normal compressibility,
respiratory phasicity and response to augmentation.

Basilic Vein: No evidence of thrombus. Normal compressibility,
respiratory phasicity and response to augmentation.

Brachial Veins: No evidence of thrombus. Normal compressibility,
respiratory phasicity and response to augmentation.

Radial Veins: No evidence of thrombus. Normal compressibility,
respiratory phasicity and response to augmentation.

Ulnar Veins: No evidence of thrombus. Normal compressibility,
respiratory phasicity and response to augmentation.

Venous Reflux:  None visualized.

Other Findings:  None visualized.
IMPRESSION: No evidence of DVT within the left upper extremity.

## 2018-08-07 ENCOUNTER — Other Ambulatory Visit: Payer: Self-pay | Admitting: Internal Medicine

## 2018-08-07 DIAGNOSIS — J432 Centrilobular emphysema: Secondary | ICD-10-CM

## 2019-01-09 IMAGING — DX DG CHEST 1V PORT
1 series · 1 of 1 positions shown · non-contrast
Comparison: 07/27/2016 chest radiograph

CLINICAL DATA: 57 y/o  F; syncope.

EXAM:
PORTABLE CHEST 1 VIEW

[chest ap]
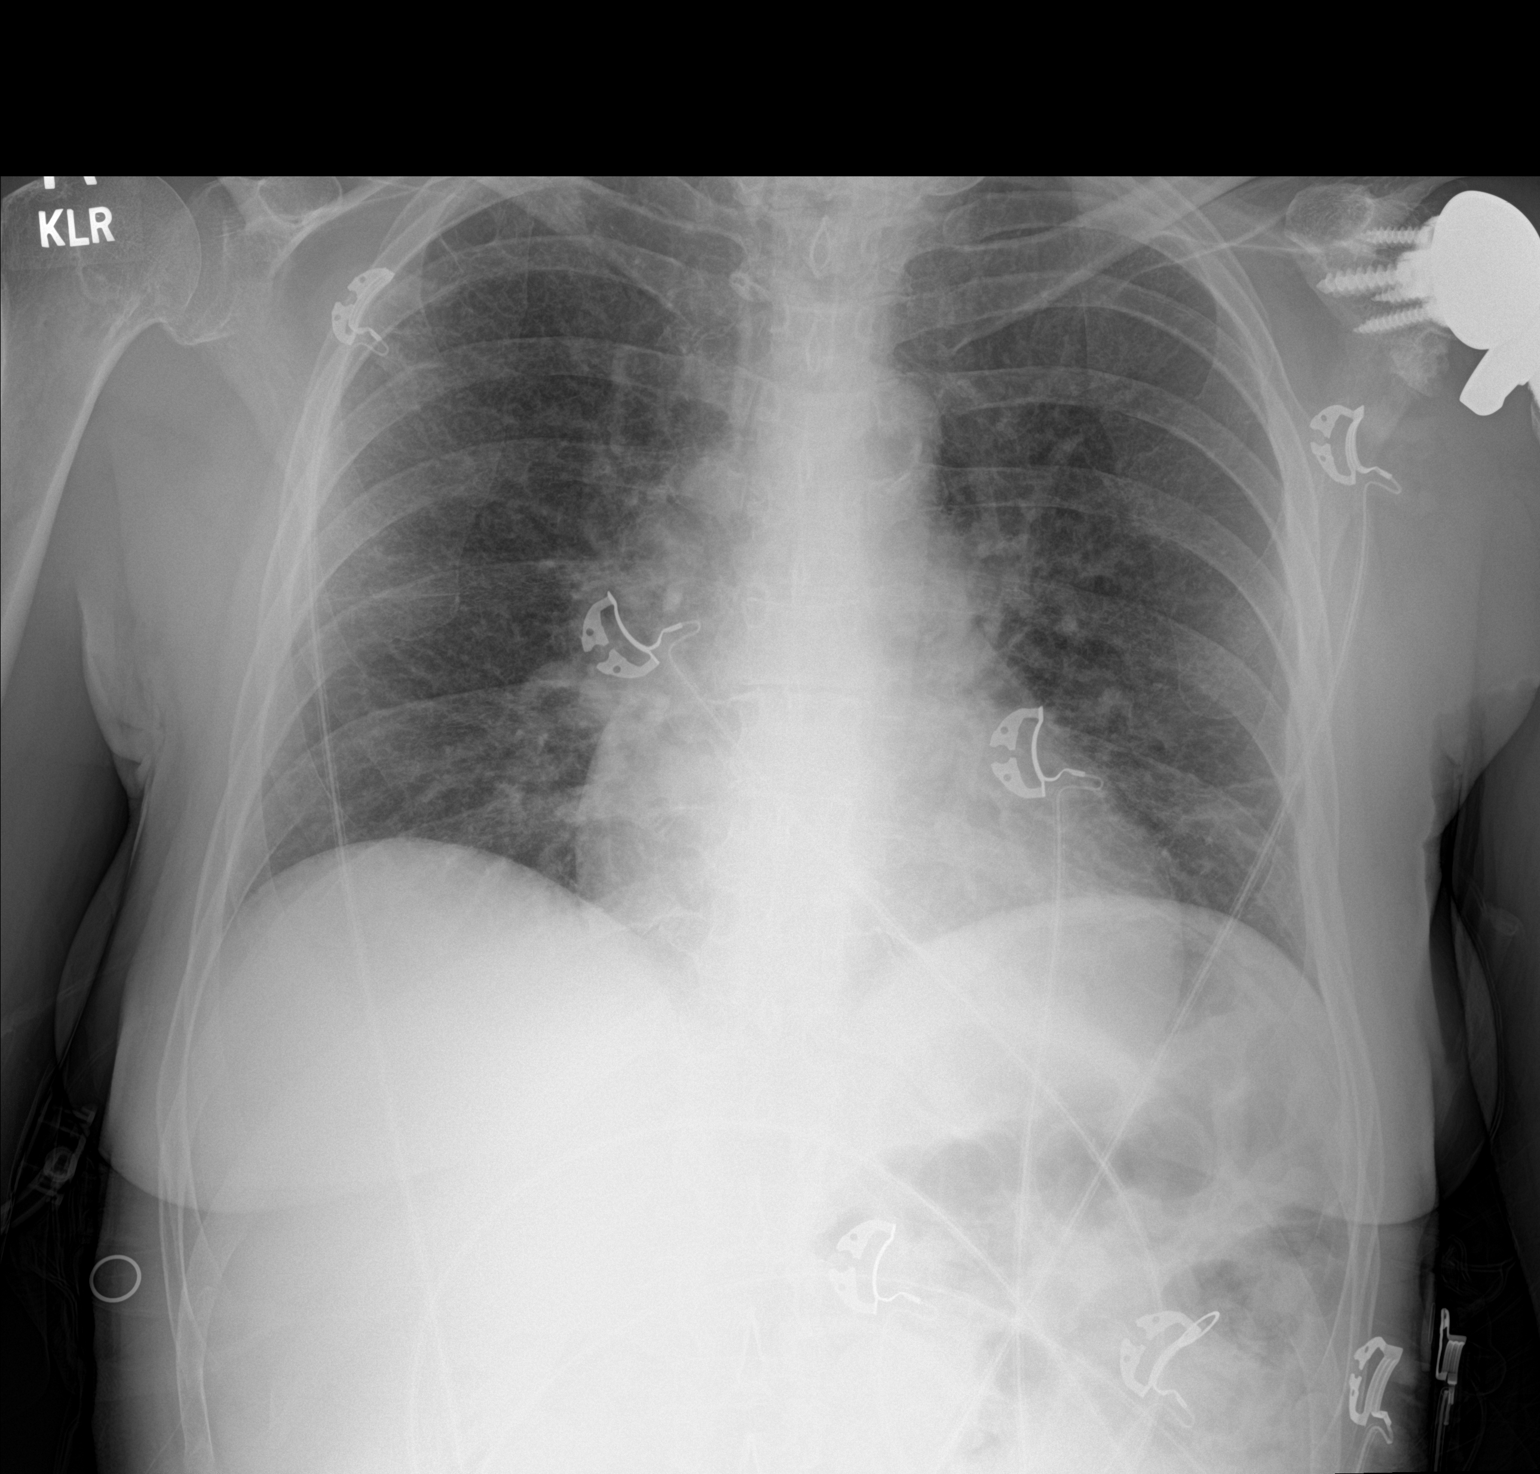

[1 of 1 positions shown; findings below may reference images not displayed]

FINDINGS: Normal cardiac silhouette. Aortic atherosclerosis with
calcification. Linear opacities in the left lung base compatible
with atelectasis or scarring. No consolidation. No pleural effusion
or pneumothorax. Anterior cervical discectomy and fusion hardware as
well as left shoulder reverse arthroplasty are partially visualized.
No acute osseous abnormality is evident.
IMPRESSION: No acute pulmonary process.  Aortic atherosclerosis.

By: Shervin Popa M.D.

## 2019-01-09 IMAGING — CR DG KNEE COMPLETE 4+V*L*
4 series · 4 of 4 positions shown · non-contrast
Comparison: Left knee x-rays dated February 23, 2018.

CLINICAL DATA: Left knee pain after fall.

EXAM:
LEFT KNEE - COMPLETE 4+ VIEW

[knee ap]
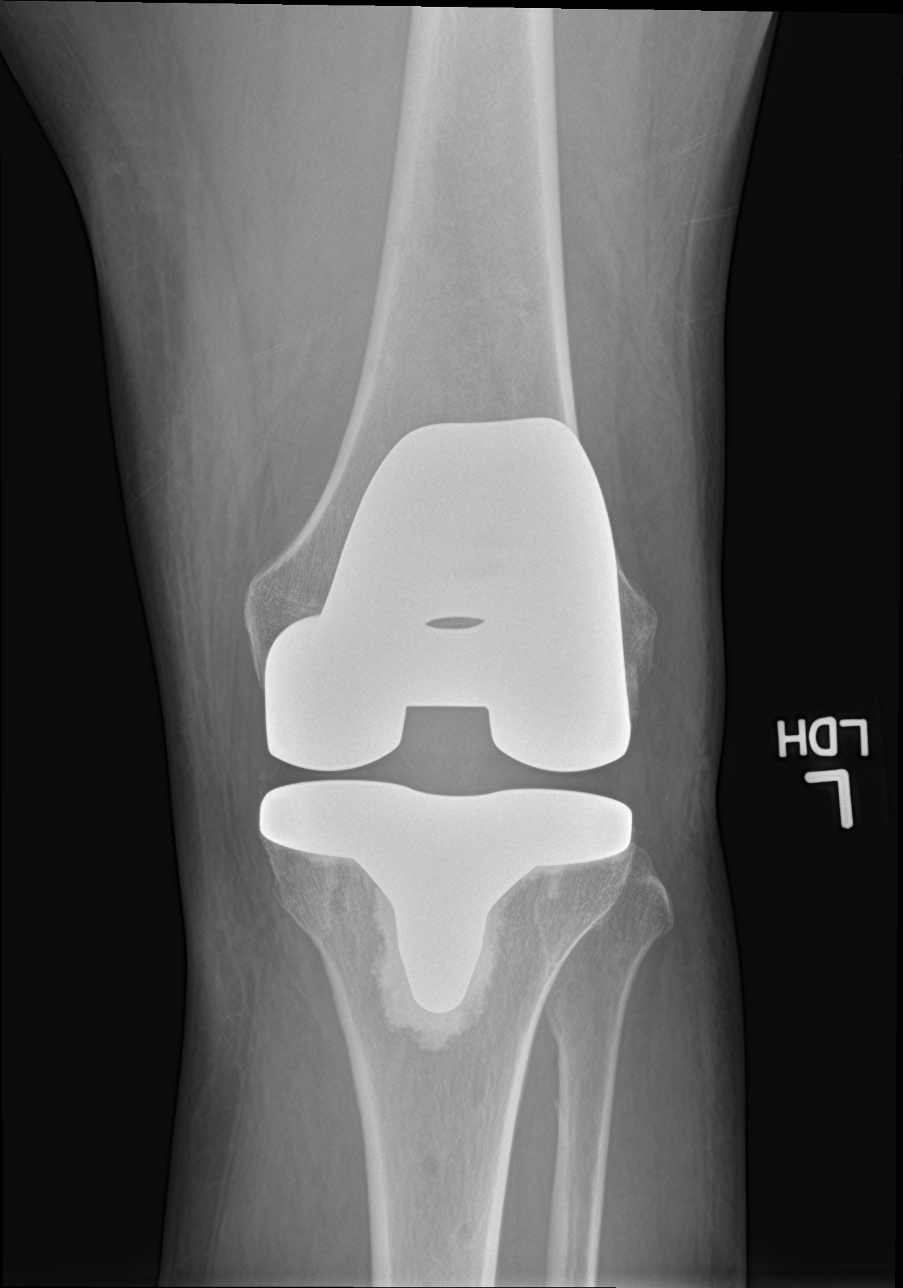

[knee obl (1 of 2)]
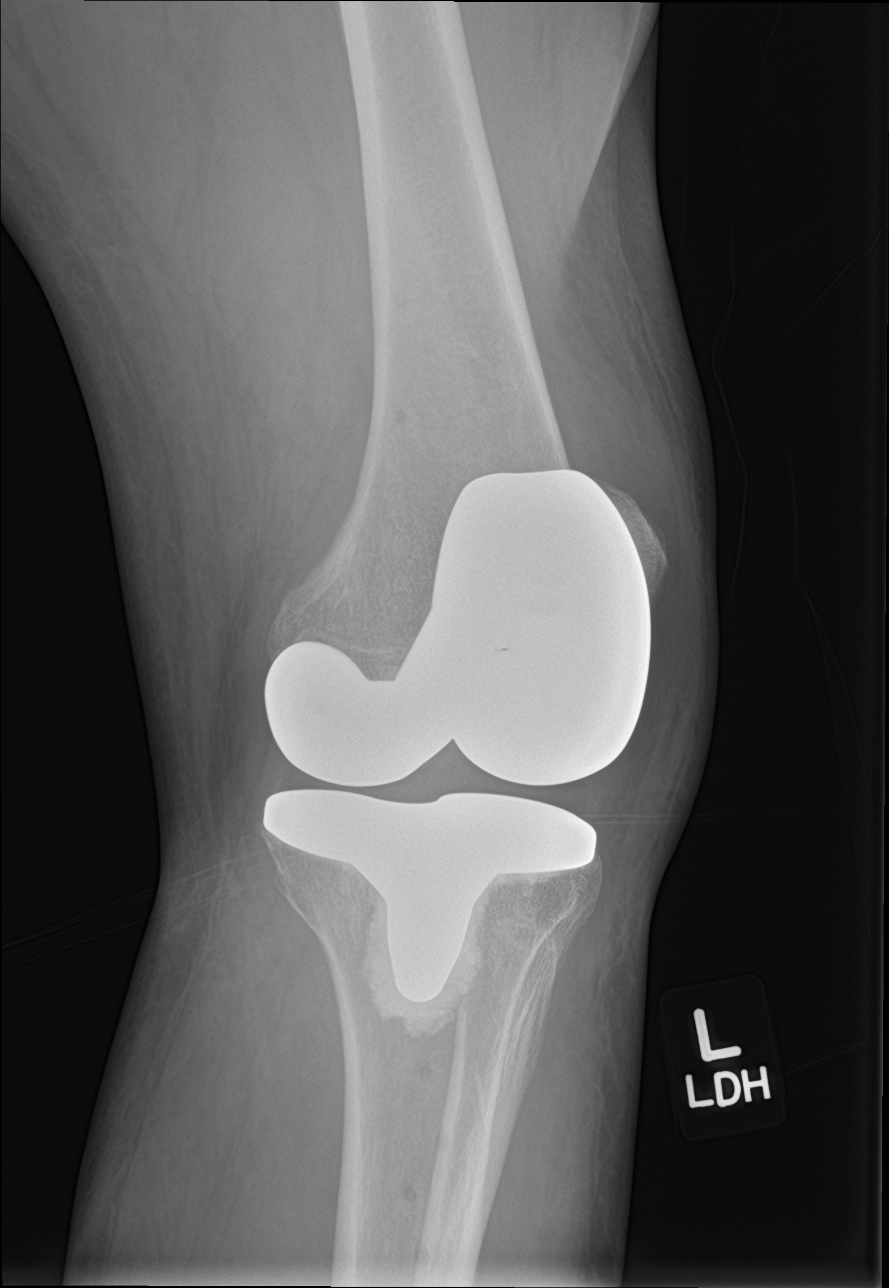

[knee obl (2 of 2)]
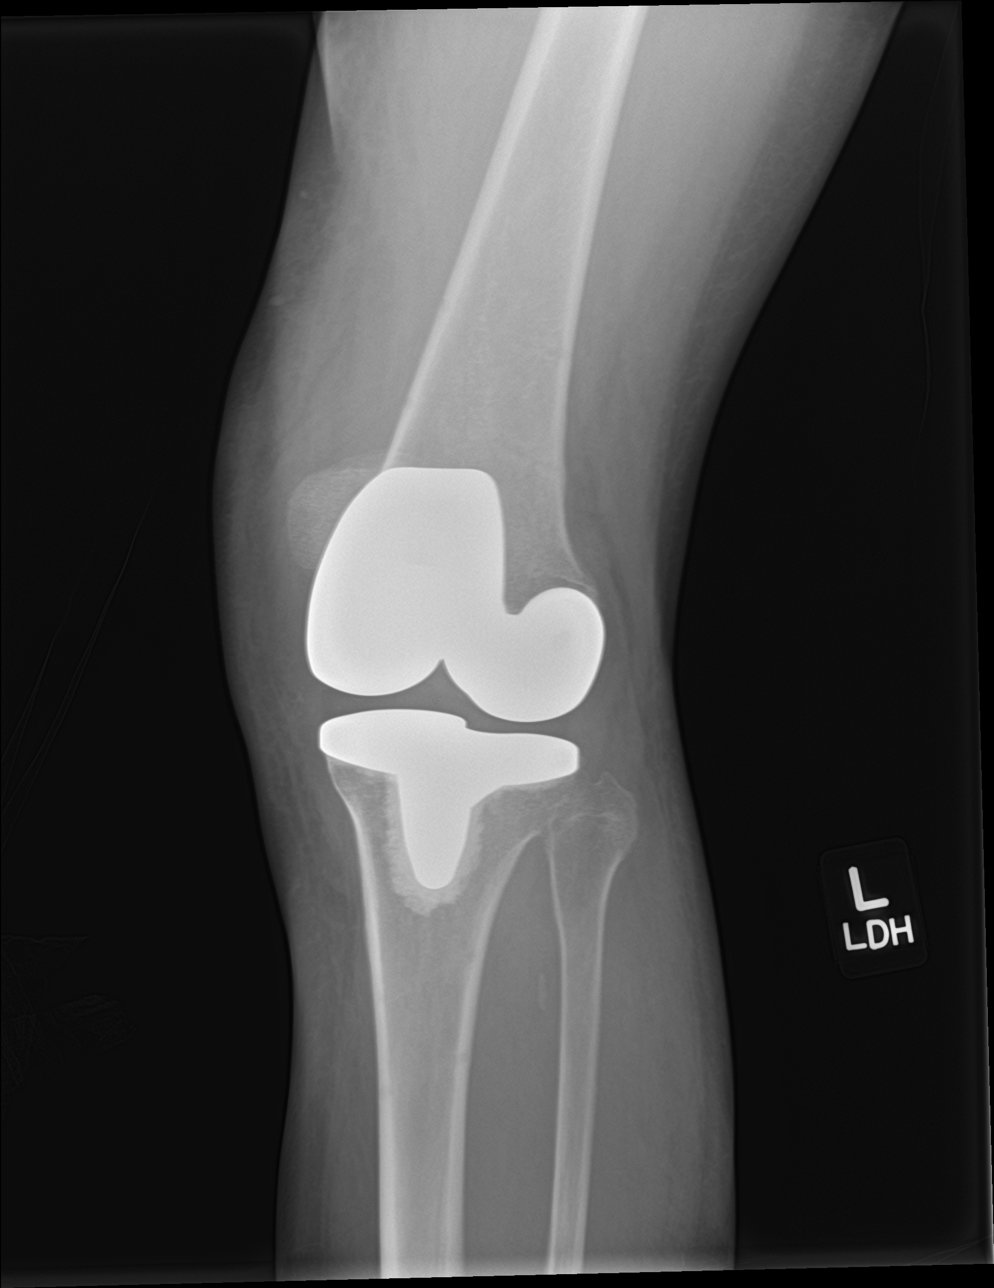

[knee lat]
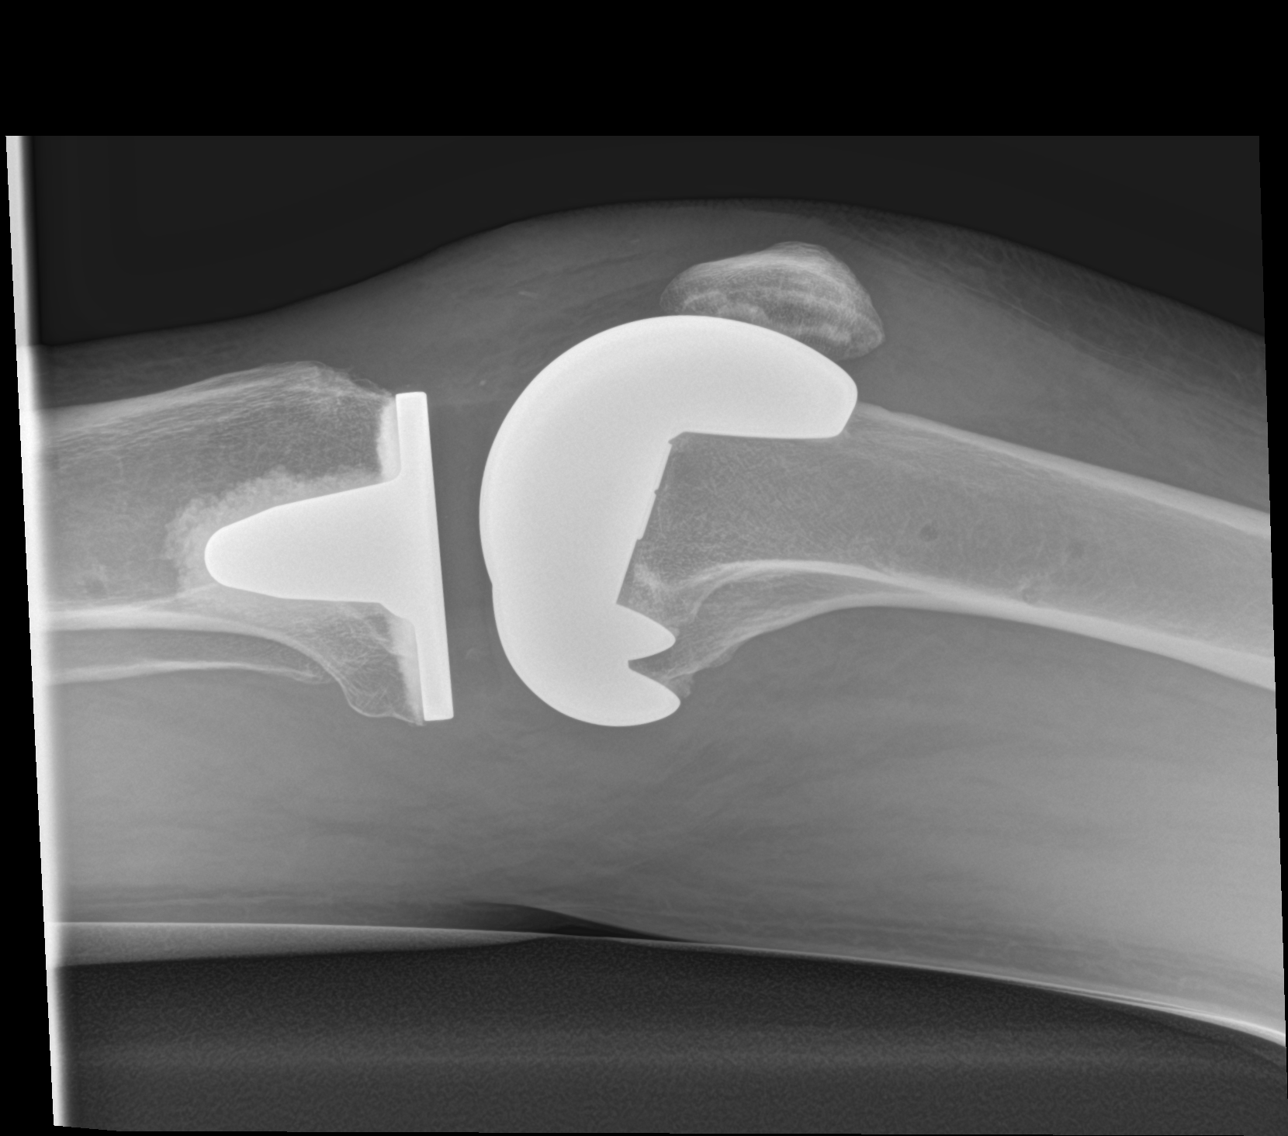

[4 of 4 positions shown; findings below may reference images not displayed]

FINDINGS: Prior left total knee arthroplasty. No evidence of hardware failure
or loosening. No acute fracture or dislocation. Small suprapatellar
joint effusion versus synovitis. Mild infrapatellar soft tissue
swelling.
IMPRESSION: 1. Infrapatellar soft tissue swelling. No acute osseous abnormality.
2. Prior total knee arthroplasty.  No hardware complication.

## 2019-01-09 IMAGING — CT CT CERVICAL SPINE W/O CM
4 of 7 series · 14 of 33 positions shown, 15 images · non-contrast
Comparison: 11/22/2015

CLINICAL DATA: Pain after mechanical fall. No loss of
consciousness.

EXAM:
CT HEAD WITHOUT CONTRAST
CT CERVICAL SPINE WITHOUT CONTRAST
TECHNIQUE: Multidetector CT imaging of the head and cervical spine was
performed following the standard protocol without intravenous
contrast. Multiplanar CT image reconstructions of the cervical spine
were also generated.

[Series 7: c spine soft · axial · 0.30mm/px · z∈[-261,-191]mm · 3 of 71 slices shown]
[im 18/71  soft-tissue]
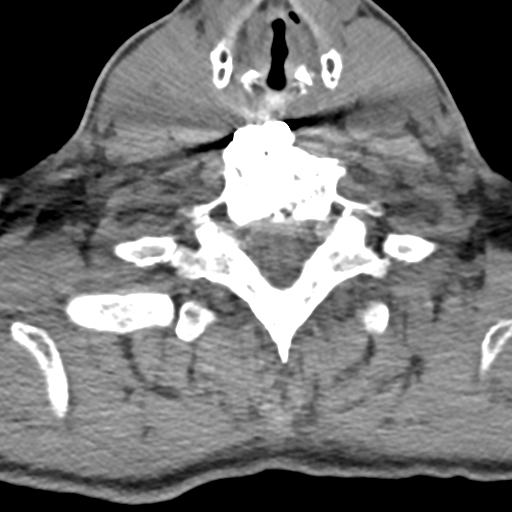
[im 36/71  soft-tissue]
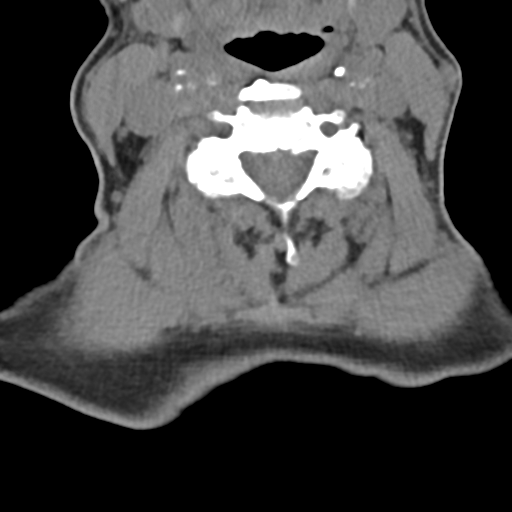
[im 53/71  soft-tissue]
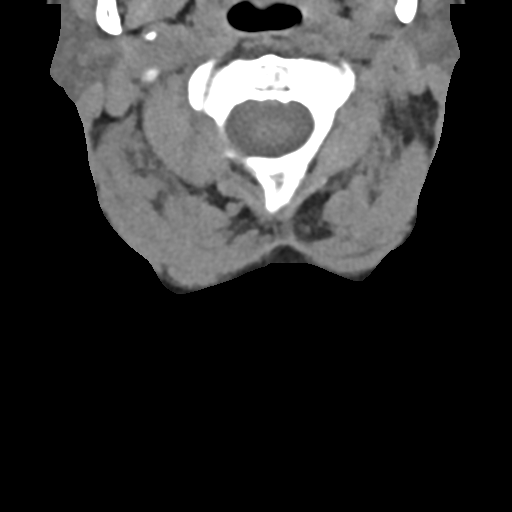

[Series 8: sagittal bone · sagittal · 0.23mm/px · 4 of 45 slices shown]
[im 9/45  bone]
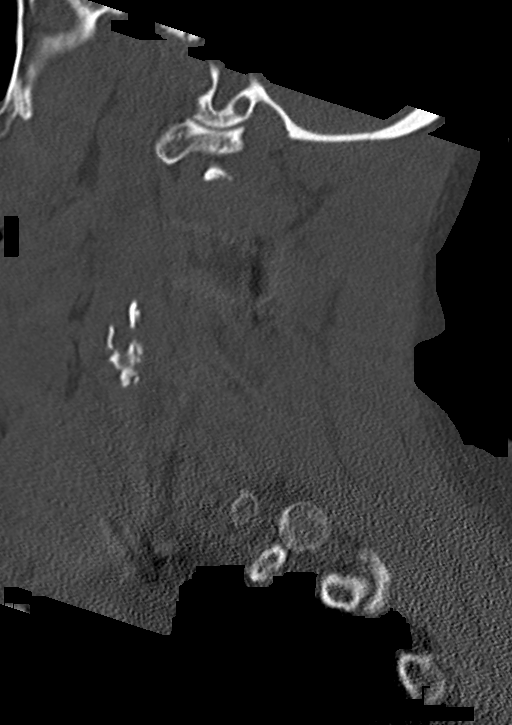
[im 18/45  bone]
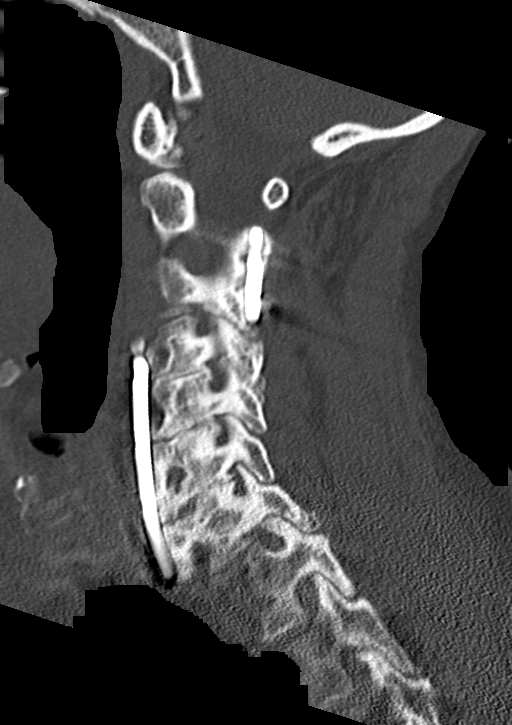
[im 27/45  bone]
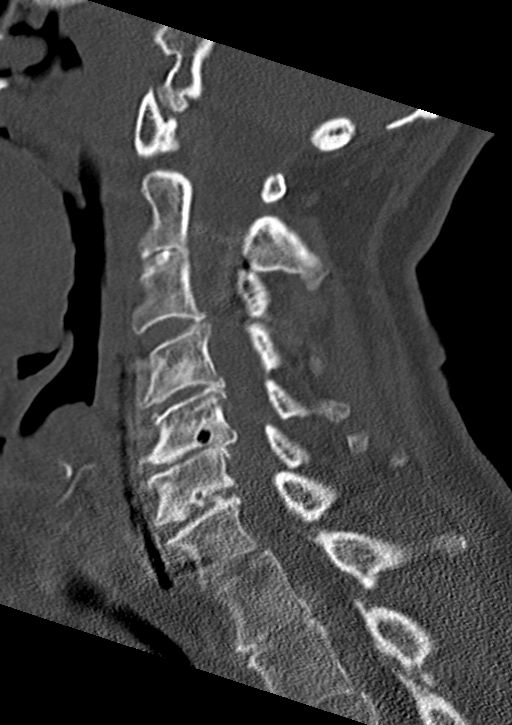
[im 36/45  bone]
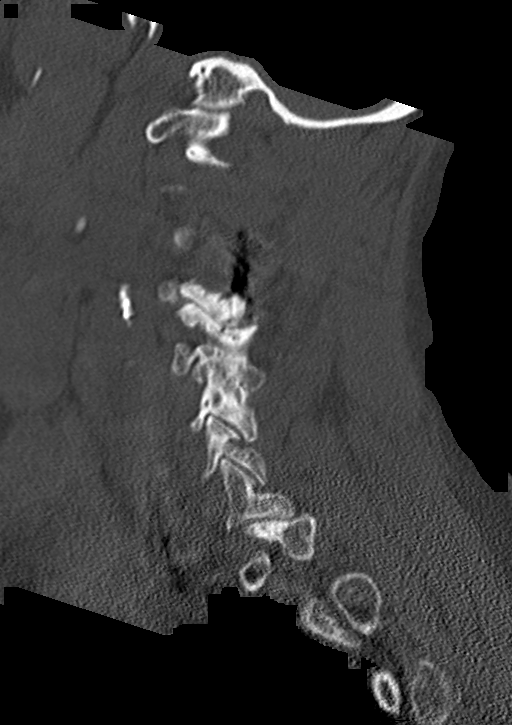

[Series 9: coronal bone · coronal · 0.23mm/px · 3 of 61 slices shown]
[im 16/61  bone]
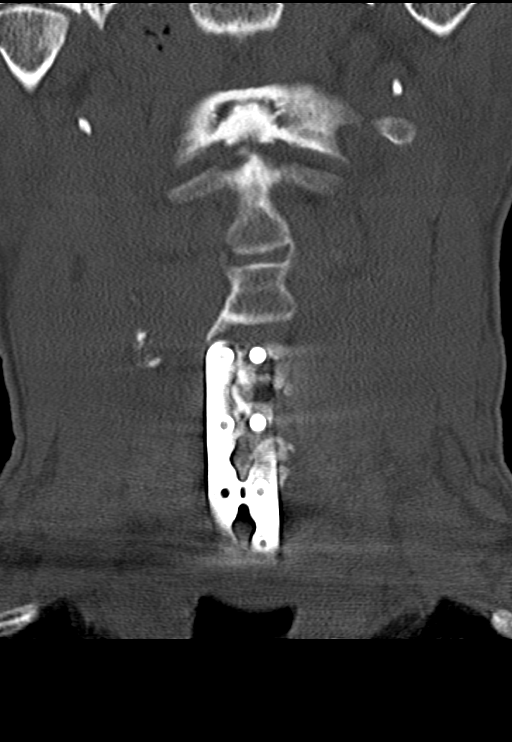
[im 31/61  bone]
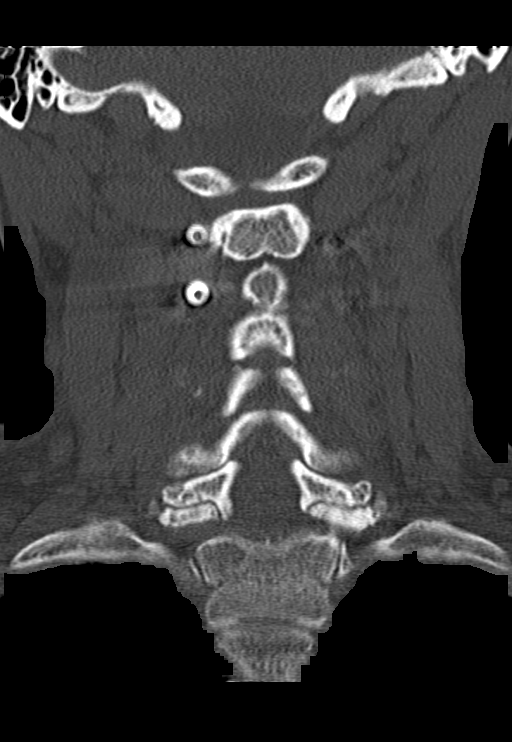
[im 46/61  bone]
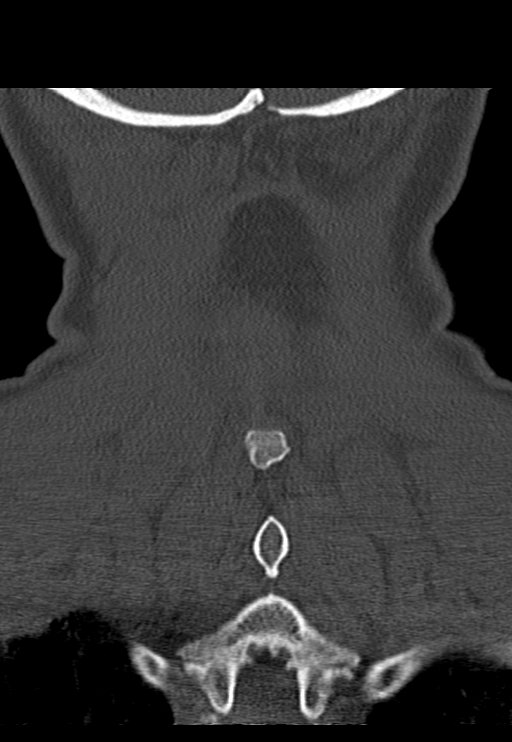

[Series 10: orthogonal bone · axial · 0.23mm/px · z∈[-284,-188]mm · 4 of 84 slices shown, 5 images]
[im 17/84  soft-tissue]
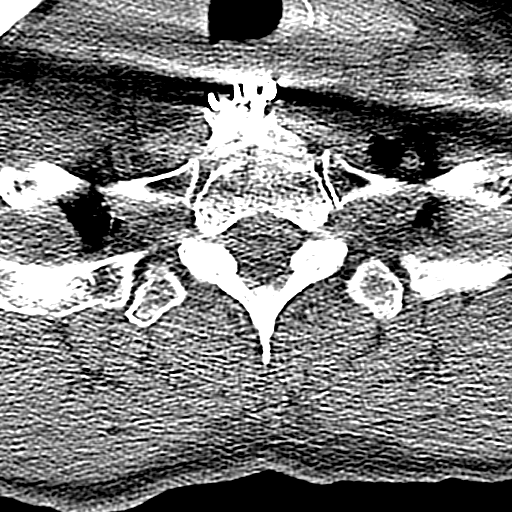
[im 17/84  bone]
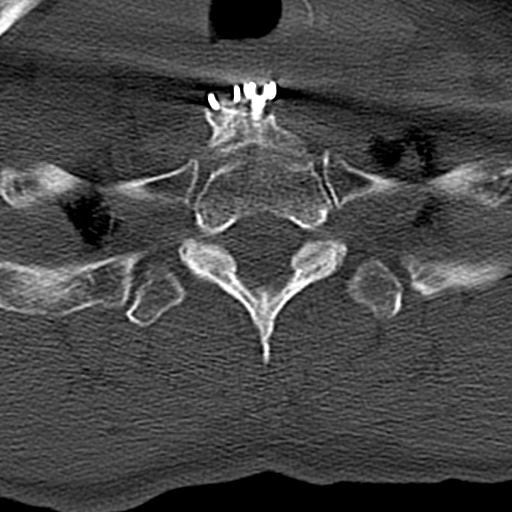
[im 34/84  bone]
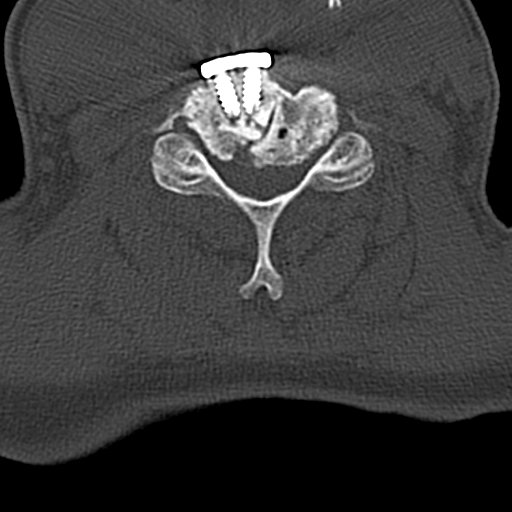
[im 50/84  bone]
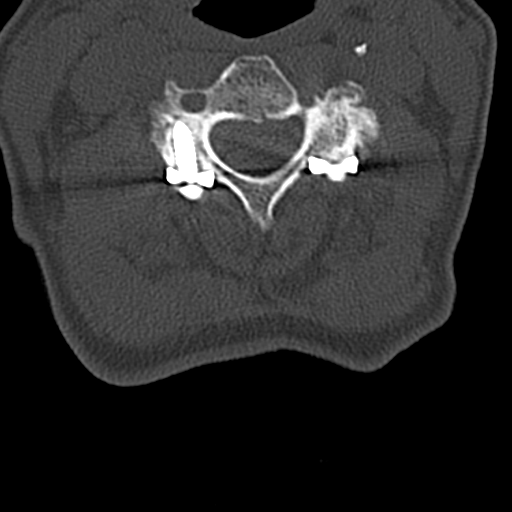
[im 67/84  bone]
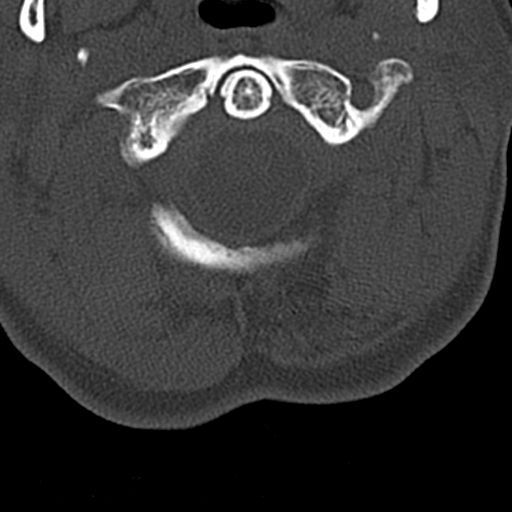

[14 of 33 positions shown; findings below may reference images not displayed]

FINDINGS: CT HEAD FINDINGS

Brain: No evidence of acute infarction, hemorrhage, hydrocephalus,
extra-axial collection or mass lesion/mass effect. Chronic minimal
small vessel ischemic disease.

Vascular: No hyperdense vessel or unexpected calcification.

Skull: Negative for fracture or focal osseous lesions.

Sinuses/Orbits: No acute finding.

Other: None

CT CERVICAL SPINE FINDINGS

Alignment: Maintained cervical lordosis.

Skull base and vertebrae: Developmental incomplete osseous union of
the posterior arch of C1. Posterior fusion at C2-3 without
complicating features. Anterior interbody fusion from C4 through C7
with intact hardware and interbody blocks identified. No significant
interbody incorporation from C4 through C7. Dorsal osteophytes C4
through C7.

Soft tissues and spinal canal: No prevertebral fluid or swelling. No
visible canal hematoma.

Disc levels: Stable mild-to-moderate disc flattening C2-3 and mild
at C3-4. Interbody blocks from C4 through C7 as above without
significant bony fusion or incorporation. Dorsal osteophytes and
uncovertebral joint osteoarthritis contributing to bilateral
foraminal encroachment from C4 through C7.

Upper chest: Upper lobe centrilobular bilaterally. No pulmonary
confluence or pneumothorax.

Other: None
IMPRESSION: 1. Normal head CT without acute intracranial abnormality. Chronic
minimal small vessel ischemic disease of periventricular white
matter.
2. Anterior interbody fusion C4 through C7 with posterior fusion
from C2 through C3. No significant incorporation of the interbody
blocks are identified from C4 through C7. No acute cervical spine
fracture however is noted nor static listhesis.

## 2019-02-02 IMAGING — MR MR LUMBAR SPINE W/O CM
4 of 5 series · 26 of 48 positions shown · non-contrast
Comparison: 07/17/15

CLINICAL DATA: Acute left-sided low back pain

EXAM:
MRI LUMBAR SPINE WITHOUT CONTRAST
TECHNIQUE: Multiplanar, multisequence MR imaging of the lumbar spine was
performed. No intravenous contrast was administered.

[Series 2: T2 · sagittal · 4.0mm · 0.81mm/px · 6 of 15 slices shown (1 of 2)]
[im 1/15]
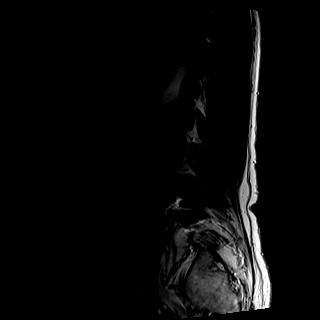
[im 3/15]
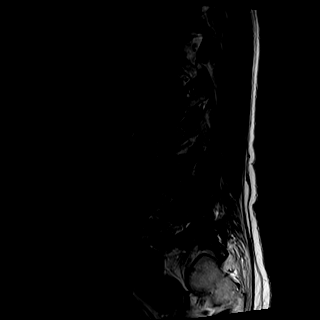
[im 6/15]
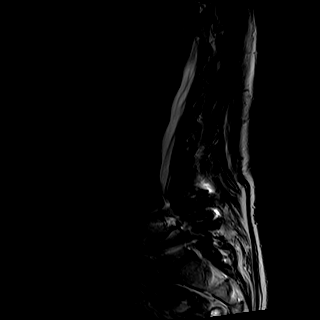
[im 9/15]
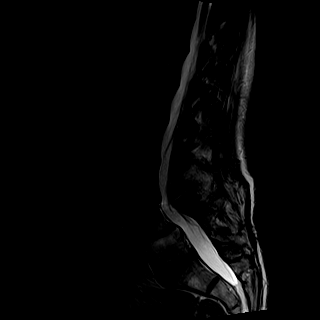
[im 12/15]
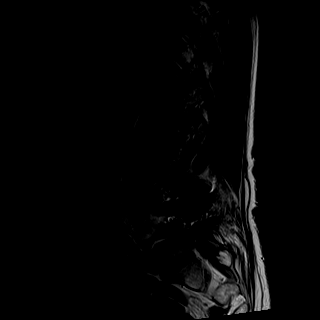
[im 15/15]
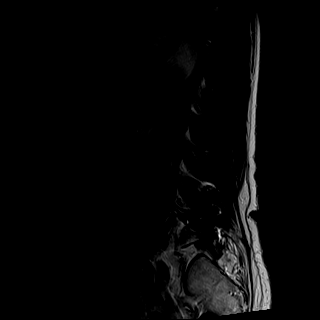

[Series 3: T1 · sagittal · 4.0mm · 0.41mm/px · 6 of 15 slices shown (1 of 2)]
[im 1/15]
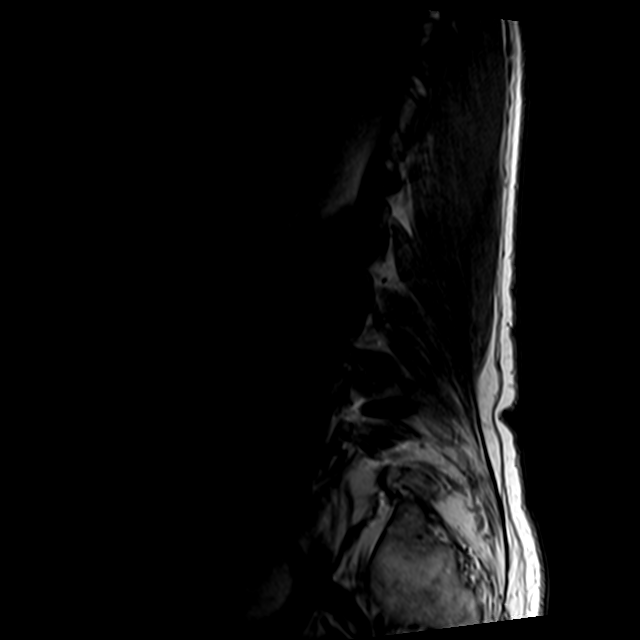
[im 3/15]
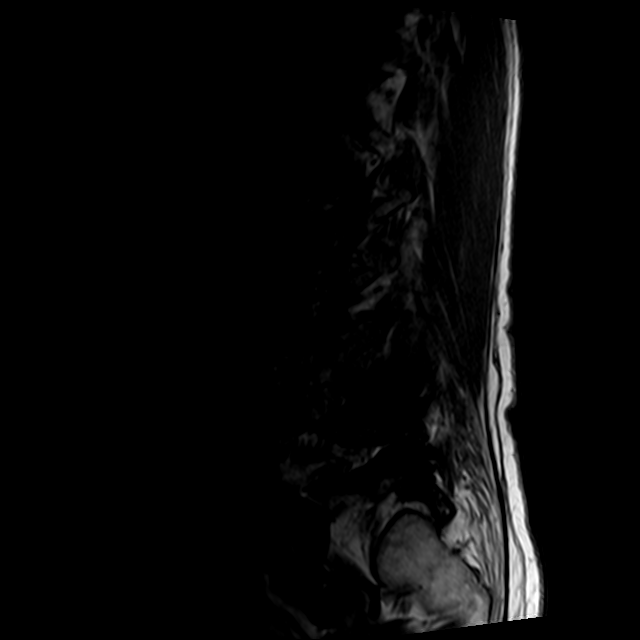
[im 6/15]
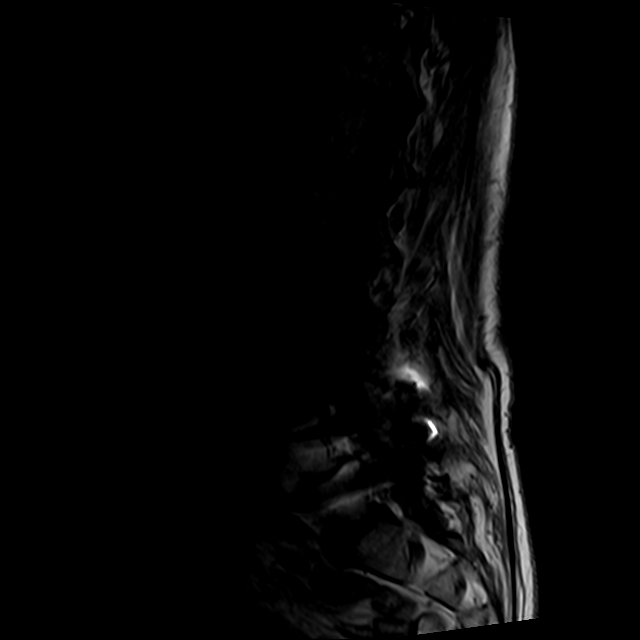
[im 9/15]
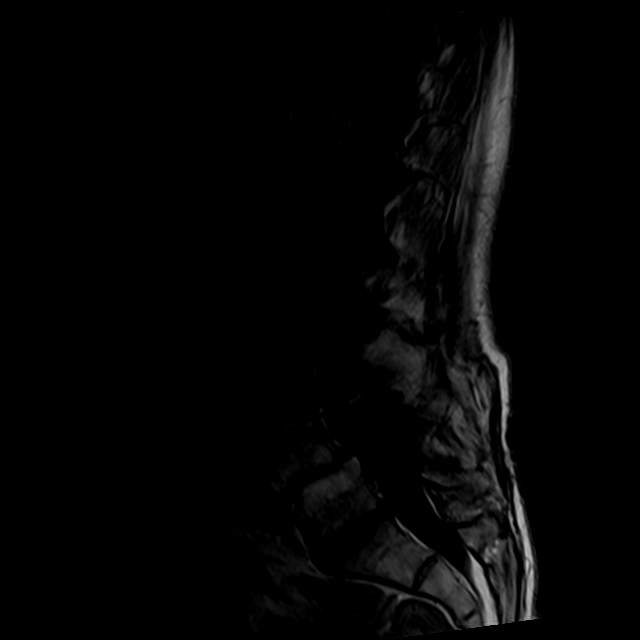
[im 12/15]
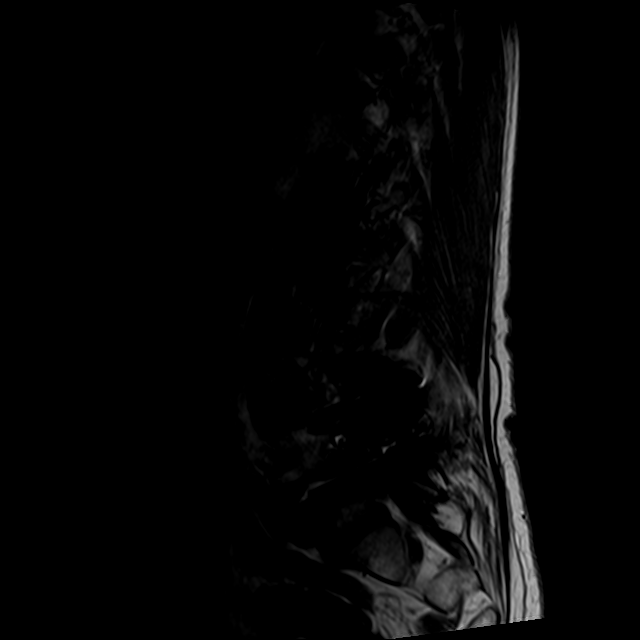
[im 15/15]
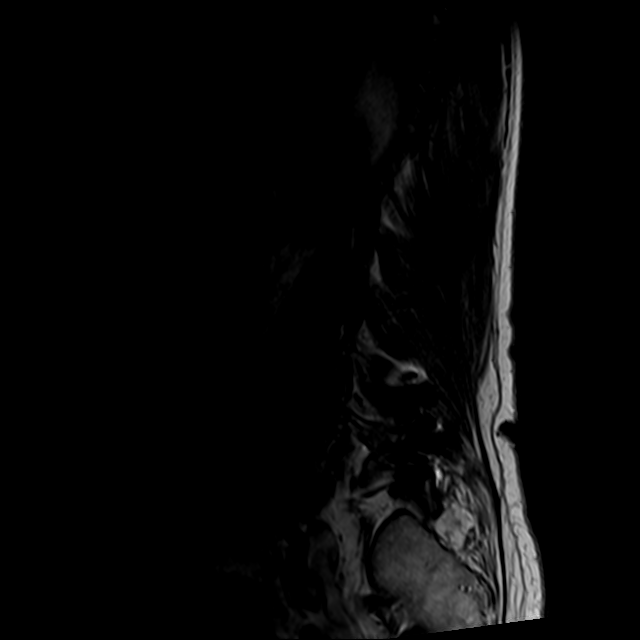

[Series 5: T2 · axial · 4.0mm · 0.78mm/px · z∈[-144,+69]mm · 9 of 35 slices shown (2 of 2)]
[im 1/35]
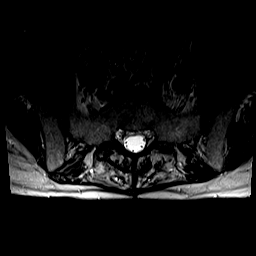
[im 5/35]
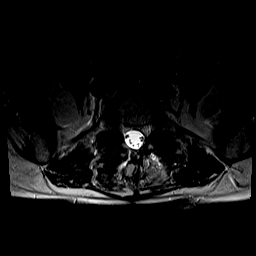
[im 10/35]
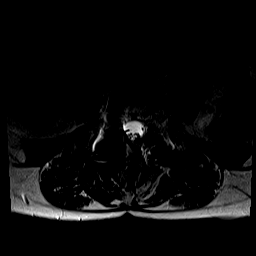
[im 15/35]
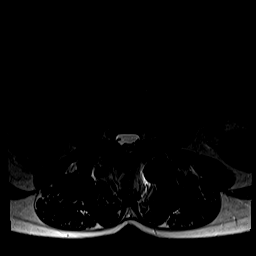
[im 18/35]
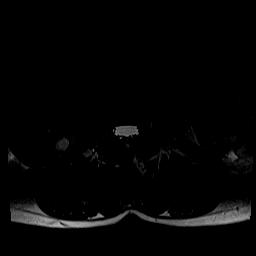
[im 20/35]
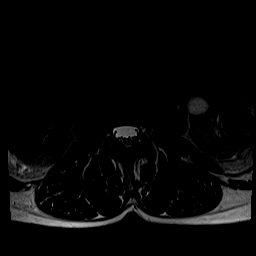
[im 25/35]
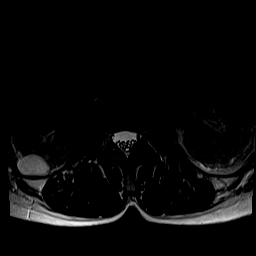
[im 30/35]
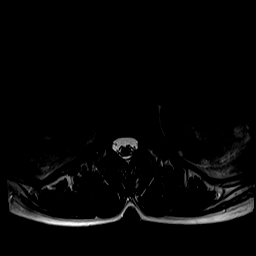
[im 35/35]
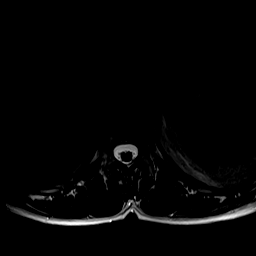

[Series 6: T1 · axial · 4.0mm · 0.39mm/px · z∈[-144,+44]mm · 5 of 35 slices shown (2 of 2)]
[im 1/35]
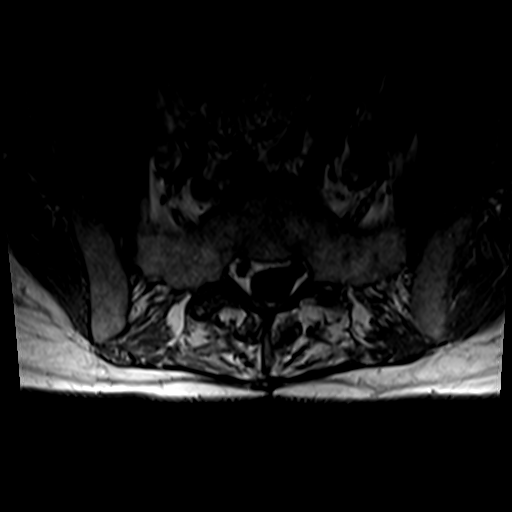
[im 5/35]
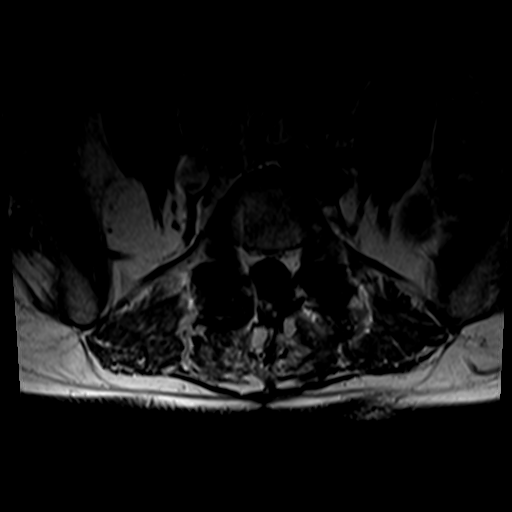
[im 10/35]
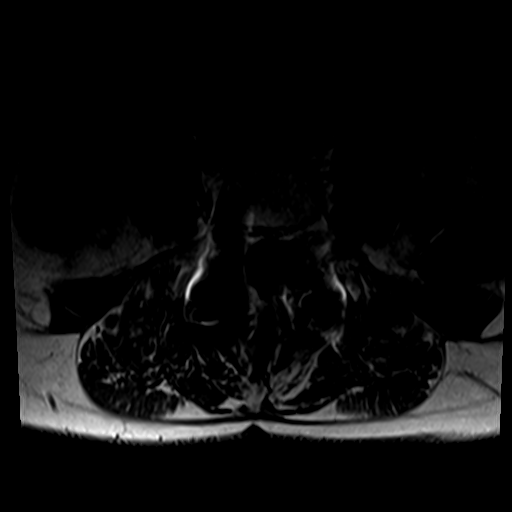
[im 18/35]
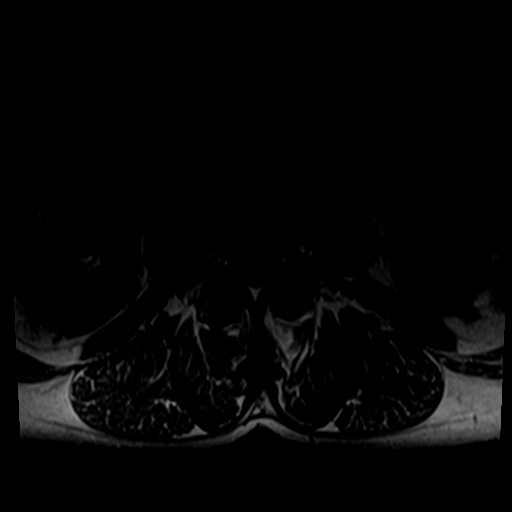
[im 30/35]
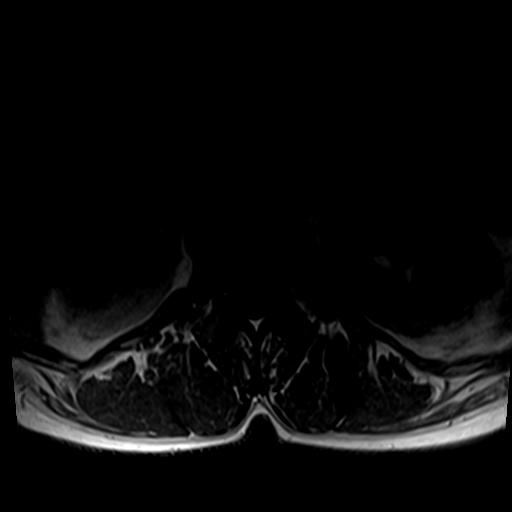

[26 of 48 positions shown; findings below may reference images not displayed]

FINDINGS: Segmentation:  5 lumbar type vertebral bodies

Alignment: L4-5 borderline grade 2 anterolisthesis. Borderline
anterolisthesis at T11-12.

Vertebrae: L4-5 posterior-lateral fusion hardware and laminectomy.
No fracture, discitis, or aggressive bone lesion. A hemangioma is
noted in the S1 body.

Conus medullaris and cauda equina: Conus extends to the L1 level.
Conus and cauda equina appear normal.

Paraspinal and other soft tissues: Renal cystic intensities.

Disc levels:

T11-12: Mild disc narrowing and bulging.  Mild facet spurring.

T12- L1: Unremarkable.

L1-L2: Unremarkable.

L2-L3: Unremarkable.

L3-L4: Unremarkable.

L4-L5: Severe disc collapse with anterolisthesis. There has been
laminectomy with patent canal. Disc height loss causes bilateral
foraminal narrowing but no definite neural compression.

L5-S1:Mild disc narrowing with small central protrusion on sagittal
images. Facet spurring. No impingement.
IMPRESSION: 1. L4-5 decompression and posterior-lateral fusion since 2403
comparison with resolved spinal stenosis. Narrow foramina without
visible neural compression.
2. Degenerative changes at the adjacent levels is similar to prior.
No new level of impingement.

## 2019-02-02 IMAGING — CT CT L SPINE W/O CM
4 of 5 series · 14 of 33 positions shown, 16 images · non-contrast
Comparison: Lumbar spine radiographs 12/16/2016. MRI of the lumbar
spine 07/17/2015

CLINICAL DATA: Lumbar radiculopathy. The patient complains of mid
pelvic numbness extending medial aspect of the left lower extremity
since 04/19/2018. Bilateral foot numbness.

EXAM:
CT LUMBAR SPINE WITHOUT CONTRAST
TECHNIQUE: Multidetector CT imaging of the lumbar spine was performed without
intravenous contrast administration. Multiplanar CT image
reconstructions were also generated.

[Series 3: lspine bone l-spine · axial · 0.35mm/px · z∈[-1383,-1267]mm · 3 of 116 slices shown, 4 images]
[im 29/116  soft-tissue]
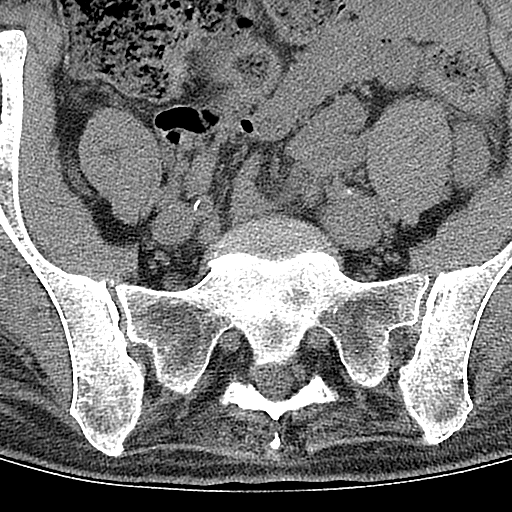
[im 29/116  bone]
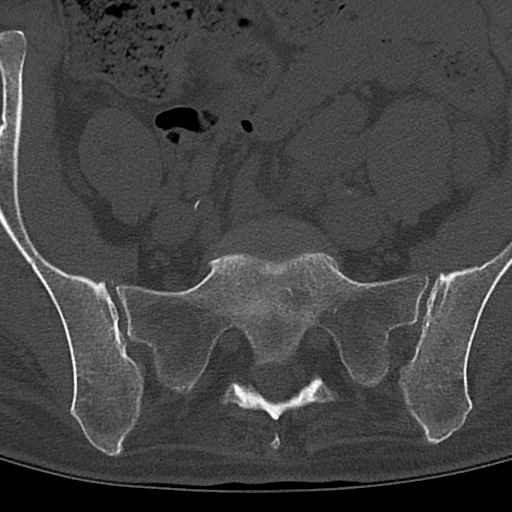
[im 58/116  bone]
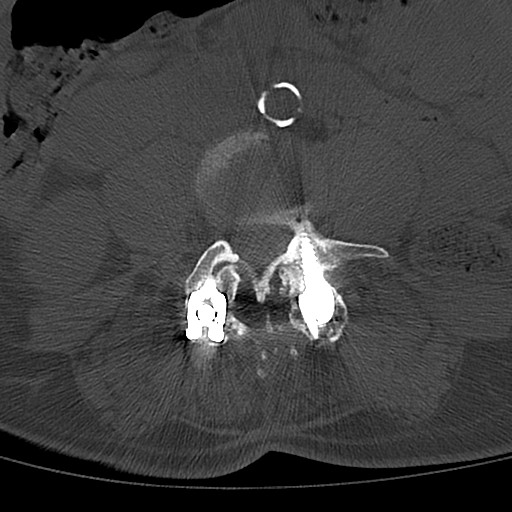
[im 87/116  bone]
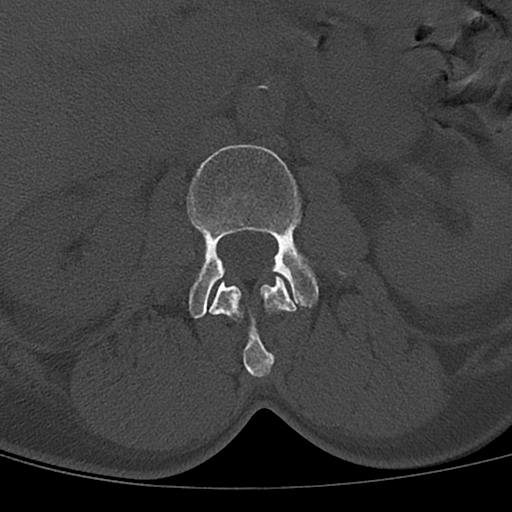

[Series 4: sag bone l-spine · sagittal · 0.35mm/px · 5 of 87 slices shown, 6 images]
[im 29/87  bone]
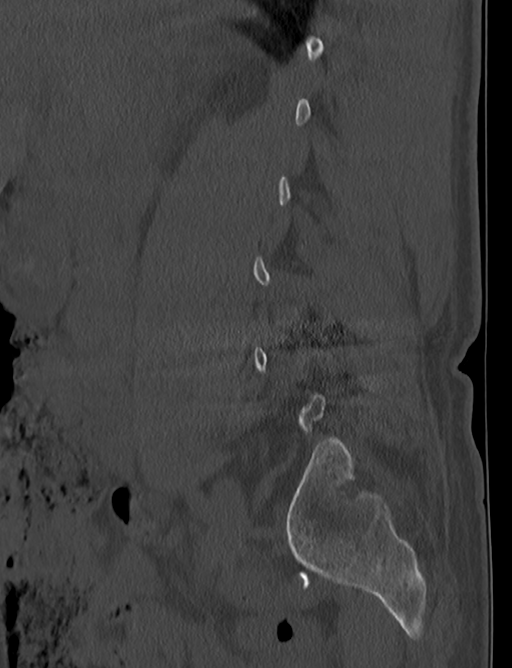
[im 36/87  bone]
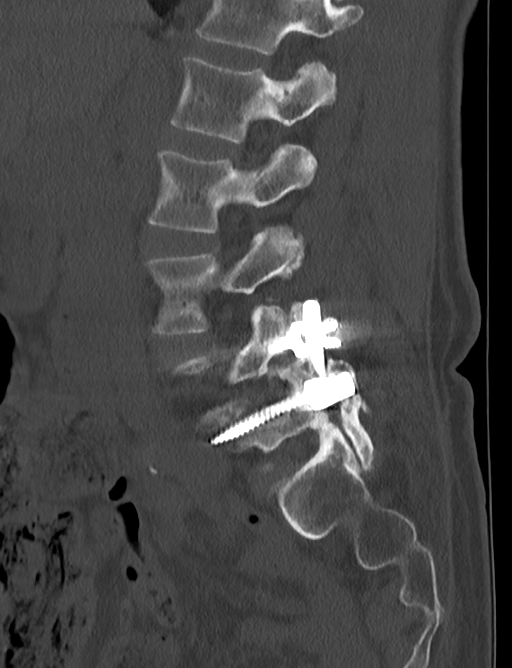
[im 44/87  soft-tissue]
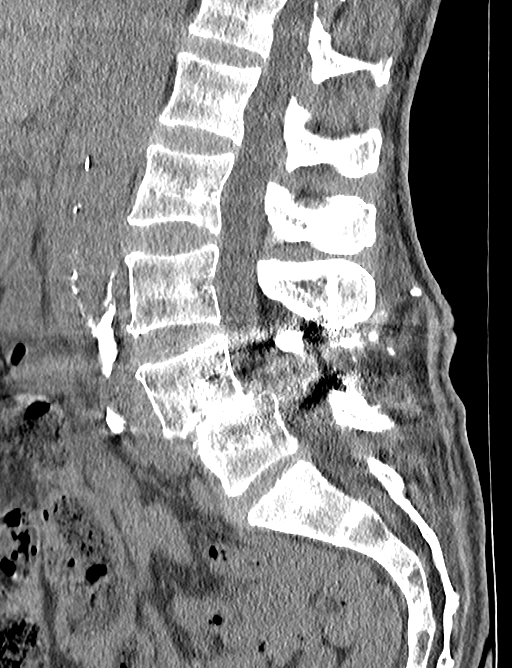
[im 44/87  bone]
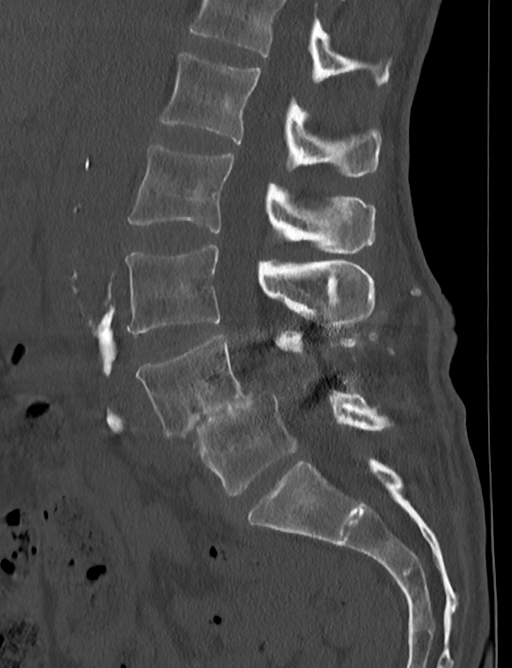
[im 51/87  bone]
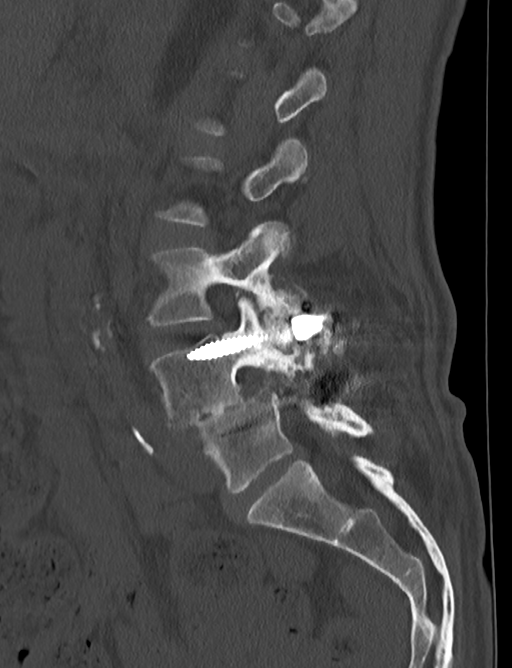
[im 58/87  bone]
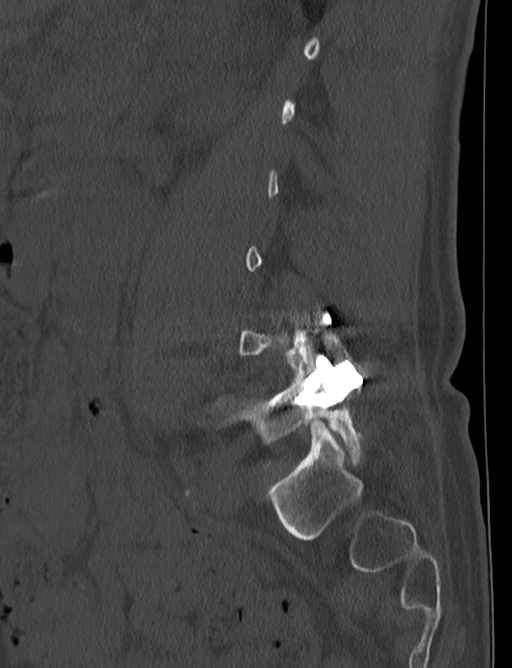

[Series 6: cor bone l-spine · coronal · 0.35mm/px · 3 of 89 slices shown]
[im 18/89  bone]
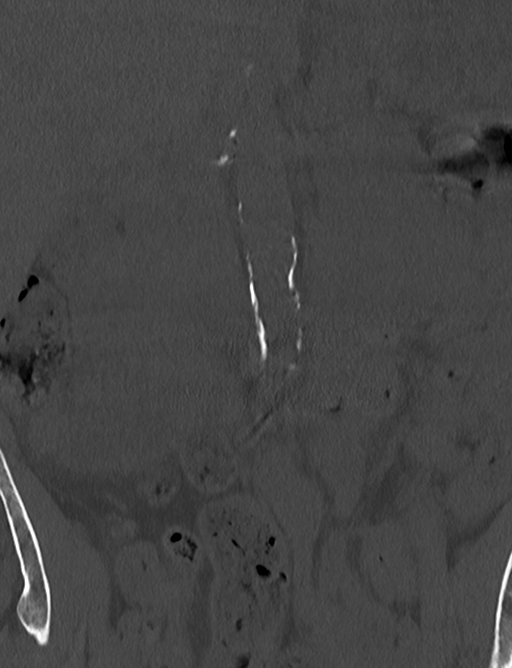
[im 36/89  bone]
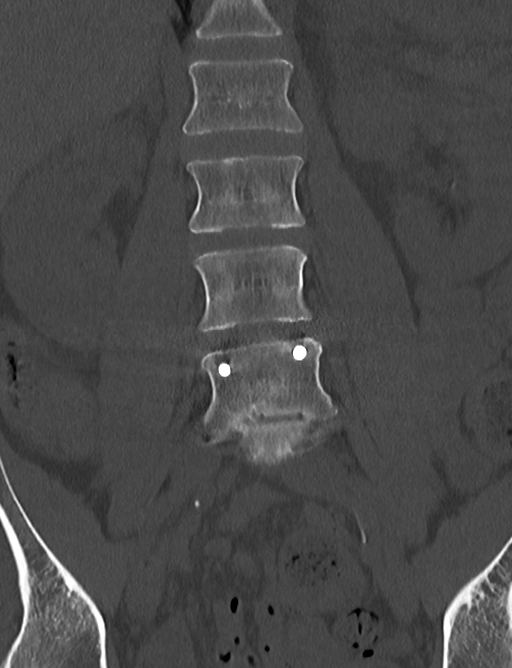
[im 53/89  bone]
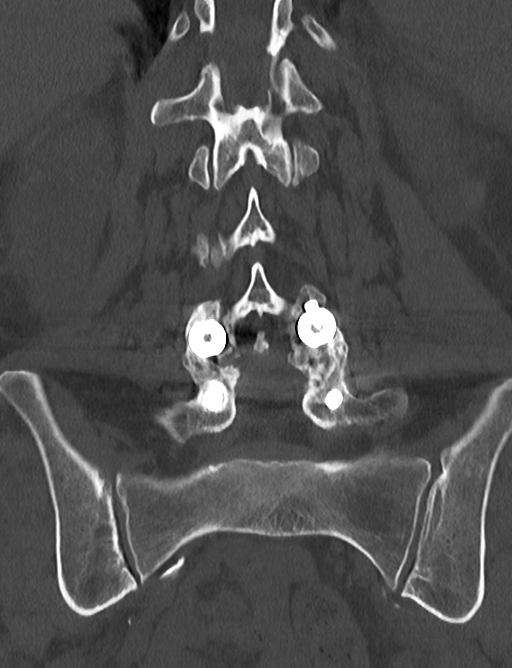

[Series 11: (person_name) · axial · 0.34mm/px · z∈[-1382,-1266]mm · 3 of 118 slices shown]
[im 30/118  bone]
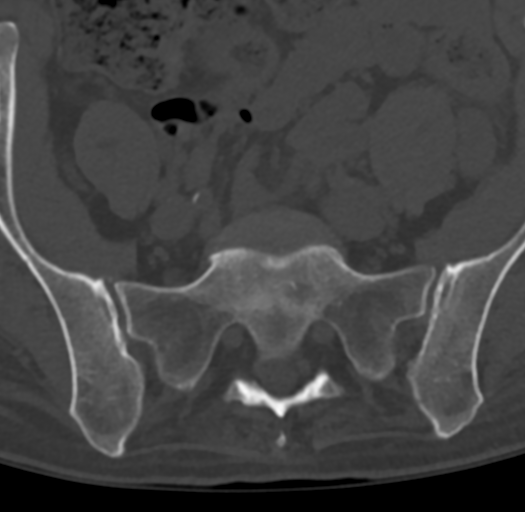
[im 59/118  bone]
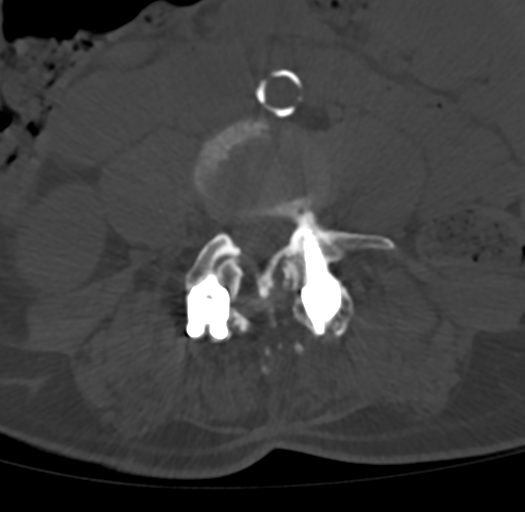
[im 88/118  bone]
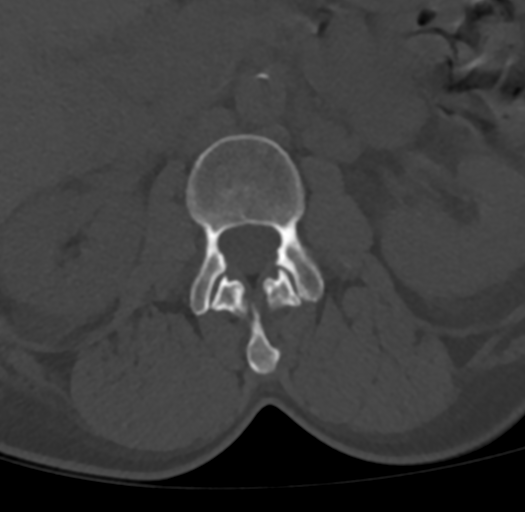

[14 of 33 positions shown; findings below may reference images not displayed]

FINDINGS: Segmentation: 5 non rib-bearing lumbar type vertebral bodies are
present. The lowest fully formed vertebral body is L5.

Alignment: 9 mm anterolisthesis is present at L4-5, stable. AP
alignment is otherwise anatomic.

Vertebrae: Vertebral body heights are maintained. No focal lytic or
blastic lesions are present.

Paraspinal and other soft tissues: Atherosclerotic calcifications
are present in the aorta without aneurysm. A benign simple cyst is
present posteriorly in the right kidney, measuring 2.4 x 1.7 cm. A
benign cyst anteriorly in the left kidney measures 1.9 cm. No
significant adenopathy is present. The lung bases are clear.

Disc levels: L1-2: Negative.

L2-3: Negative.

L3-4: Mild disc bulging and moderate bilateral facet hypertrophy is
present. There is no significant stenosis.

L4-5: Solid fusion is present across the disc space. Posterior
elements are solidly fused as well. Laminectomy decompresses the
central canal. Foramina are widely patent bilaterally.

L5-S1: Broad-based disc protrusion is present. Mild facet
hypertrophy is noted. Moderate right and mild left foraminal
narrowing has progressed since 1669.
IMPRESSION: 1. Solid lumbar fusion at L4-5 without residual or recurrent
stenosis.
2. Progressive foraminal narrowing at L5-S1, right greater than
left.
3. Moderate facet hypertrophy bilaterally at L3-4 without
significant stenosis.
4. Benign-appearing cystic lesions in both kidneys.

## 2019-05-17 ENCOUNTER — Other Ambulatory Visit: Payer: Self-pay

## 2019-05-17 ENCOUNTER — Encounter: Payer: Self-pay | Admitting: Emergency Medicine

## 2019-05-17 DIAGNOSIS — F17210 Nicotine dependence, cigarettes, uncomplicated: Secondary | ICD-10-CM | POA: Diagnosis not present

## 2019-05-17 DIAGNOSIS — J449 Chronic obstructive pulmonary disease, unspecified: Secondary | ICD-10-CM | POA: Insufficient documentation

## 2019-05-17 DIAGNOSIS — Z96651 Presence of right artificial knee joint: Secondary | ICD-10-CM | POA: Insufficient documentation

## 2019-05-17 DIAGNOSIS — Z7901 Long term (current) use of anticoagulants: Secondary | ICD-10-CM | POA: Insufficient documentation

## 2019-05-17 DIAGNOSIS — M545 Low back pain: Secondary | ICD-10-CM | POA: Diagnosis present

## 2019-05-17 DIAGNOSIS — Z79899 Other long term (current) drug therapy: Secondary | ICD-10-CM | POA: Diagnosis not present

## 2019-05-17 DIAGNOSIS — E039 Hypothyroidism, unspecified: Secondary | ICD-10-CM | POA: Insufficient documentation

## 2019-05-17 NOTE — ED Triage Notes (Addendum)
Pt to triage via w/c with no distress noted, brought in by EMS; st fell last night after cat tripped her and now having lower back pain "shooting down legs"

## 2019-05-18 ENCOUNTER — Emergency Department
Admission: EM | Admit: 2019-05-18 | Discharge: 2019-05-18 | Disposition: A | Discharge status: Home / Self Care | Payer: Medicaid Other | Attending: Emergency Medicine | Admitting: Emergency Medicine

## 2019-05-18 ENCOUNTER — Emergency Department: Payer: Medicaid Other

## 2019-05-18 DIAGNOSIS — M545 Low back pain, unspecified: Secondary | ICD-10-CM

## 2019-05-18 MED ORDER — MORPHINE SULFATE (PF) 4 MG/ML IV SOLN
4.0000 mg | Freq: Once | INTRAVENOUS | Status: AC
  Administered 2019-05-18: 4 mg via INTRAMUSCULAR
  Filled 2019-05-18: qty 1

## 2019-05-18 NOTE — Discharge Instructions (Addendum)
As we discussed please follow-up with your pain management physician for recheck/reevaluation.  We will not be able to treat your back pain in the emergency department going forward as you are on chronic pain medication at home.  Please return to the emergency department for further evaluation if you develop weakness/numbness of your legs bladder or bowel incontinence, or any other symptom personally concerning to yourself.

## 2019-05-18 NOTE — ED Notes (Addendum)
Pt requesting not to stay for the 15 minute med hold after IM medication. Pt ambulatory to lobby in [place].
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
## 2019-05-26 ENCOUNTER — Emergency Department: Payer: Medicaid Other

## 2019-05-26 ENCOUNTER — Other Ambulatory Visit: Payer: Self-pay

## 2019-05-26 ENCOUNTER — Emergency Department
Admission: EM | Admit: 2019-05-26 | Discharge: 2019-05-26 | Disposition: A | Discharge status: Home / Self Care | Payer: Medicaid Other | Attending: Emergency Medicine | Admitting: Emergency Medicine

## 2019-05-26 DIAGNOSIS — S0990XA Unspecified injury of head, initial encounter: Secondary | ICD-10-CM | POA: Diagnosis present

## 2019-05-26 DIAGNOSIS — F17220 Nicotine dependence, chewing tobacco, uncomplicated: Secondary | ICD-10-CM | POA: Insufficient documentation

## 2019-05-26 DIAGNOSIS — Z7901 Long term (current) use of anticoagulants: Secondary | ICD-10-CM | POA: Diagnosis not present

## 2019-05-26 DIAGNOSIS — Y939 Activity, unspecified: Secondary | ICD-10-CM | POA: Insufficient documentation

## 2019-05-26 DIAGNOSIS — Y999 Unspecified external cause status: Secondary | ICD-10-CM | POA: Insufficient documentation

## 2019-05-26 DIAGNOSIS — J449 Chronic obstructive pulmonary disease, unspecified: Secondary | ICD-10-CM | POA: Insufficient documentation

## 2019-05-26 DIAGNOSIS — Y929 Unspecified place or not applicable: Secondary | ICD-10-CM | POA: Insufficient documentation

## 2019-05-26 DIAGNOSIS — S0081XA Abrasion of other part of head, initial encounter: Secondary | ICD-10-CM | POA: Diagnosis not present

## 2019-05-26 DIAGNOSIS — Z79899 Other long term (current) drug therapy: Secondary | ICD-10-CM | POA: Diagnosis not present

## 2019-05-26 DIAGNOSIS — Z96651 Presence of right artificial knee joint: Secondary | ICD-10-CM | POA: Insufficient documentation

## 2019-05-26 DIAGNOSIS — S5012XA Contusion of left forearm, initial encounter: Secondary | ICD-10-CM | POA: Insufficient documentation

## 2019-05-26 DIAGNOSIS — F17210 Nicotine dependence, cigarettes, uncomplicated: Secondary | ICD-10-CM | POA: Insufficient documentation

## 2019-05-26 MED ORDER — MORPHINE SULFATE (PF) 4 MG/ML IV SOLN
4.0000 mg | Freq: Once | INTRAVENOUS | Status: AC
  Administered 2019-05-26: 21:00:00 4 mg via INTRAMUSCULAR
  Filled 2019-05-26: qty 1
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
## 2019-05-28 ENCOUNTER — Emergency Department: Payer: Medicaid Other

## 2019-05-28 ENCOUNTER — Emergency Department
Admission: EM | Admit: 2019-05-28 | Discharge: 2019-05-28 | Disposition: A | Discharge status: Home / Self Care | Payer: Medicaid Other | Attending: Emergency Medicine | Admitting: Emergency Medicine

## 2019-05-28 ENCOUNTER — Other Ambulatory Visit: Payer: Self-pay

## 2019-05-28 DIAGNOSIS — F17210 Nicotine dependence, cigarettes, uncomplicated: Secondary | ICD-10-CM | POA: Diagnosis not present

## 2019-05-28 DIAGNOSIS — J449 Chronic obstructive pulmonary disease, unspecified: Secondary | ICD-10-CM | POA: Insufficient documentation

## 2019-05-28 DIAGNOSIS — Z96652 Presence of left artificial knee joint: Secondary | ICD-10-CM | POA: Diagnosis not present

## 2019-05-28 DIAGNOSIS — R0781 Pleurodynia: Secondary | ICD-10-CM

## 2019-05-28 DIAGNOSIS — Z79899 Other long term (current) drug therapy: Secondary | ICD-10-CM | POA: Diagnosis not present

## 2019-05-28 DIAGNOSIS — R079 Chest pain, unspecified: Secondary | ICD-10-CM | POA: Diagnosis present

## 2019-05-28 DIAGNOSIS — R0789 Other chest pain: Secondary | ICD-10-CM | POA: Insufficient documentation

## 2019-05-28 DIAGNOSIS — Z0471 Encounter for examination and observation following alleged adult physical abuse: Secondary | ICD-10-CM | POA: Insufficient documentation
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
## 2019-05-28 NOTE — ED Notes (Signed)
On clarification, pt complains of right sided rib pain, not left. Pt attached to ekg machine, begins to rip leads off during exam. Pt state she does not want an ekg or any blood work. Pt states "I just want to see the doctor, I can't sit in this chair". Explanation of need for ekg and xrays provided to pt. Pt offered recliner chair in subwait.

## 2019-05-28 NOTE — Discharge Instructions (Addendum)
Your workup in the Emergency Department today was reassuring.  We did not find any specific abnormalities.  We recommend you drink plenty of fluids, take your regular medications and/or any new ones prescribed today, and follow up with the doctor(s) listed in these documents as recommended.  Return to the Emergency Department if you develop new or worsening symptoms that concern you.  

## 2019-05-28 NOTE — ED Notes (Signed)
Pt to xray
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
## 2019-05-28 NOTE — ED Notes (Addendum)
Pt states at this time she does not want to stay and be seen. Melody, ed tech in triage to witness conversation. Pt refusing ekg and xrays at this time. Pt states she cannot stay and be seen due to having an 72 month old grandson at home that needs care. Pt encouraged to stay for treatment but declines.

## 2019-06-28 ENCOUNTER — Telehealth: Payer: Self-pay

## 2019-06-28 ENCOUNTER — Other Ambulatory Visit: Payer: Self-pay

## 2019-06-28 DIAGNOSIS — Z86010 Personal history of colonic polyps: Secondary | ICD-10-CM

## 2019-06-28 NOTE — Telephone Encounter (Signed)
Gastroenterology Pre-Procedure Review  Request Date: Tuesday 07/04/19 Requesting Physician: Dr. Allen Norris  PATIENT REVIEW QUESTIONS: The patient responded to the following health history questions as indicated:    1. Are you having any GI issues? yes (inability to use the bathroom) 2. Do you have a personal history of Polyps? yes (2016 polyps Dr. Allen Norris) 3. Do you have a family history of Colon Cancer or Polyps? no 4. Diabetes Mellitus? no 5. Joint replacements in the past 12 months?no 6. Major health problems in the past 3 months?no 7. Any artificial heart valves, MVP, or defibrillator?no    MEDICATIONS & ALLERGIES:    Patient reports the following regarding taking any anticoagulation/antiplatelet therapy:   Plavix, Coumadin, Eliquis, Xarelto, Lovenox, Pradaxa, Brilinta, or Effient? no Aspirin? no  Patient confirms/reports the following medications:  Current Outpatient Medications  Medication Sig Dispense Refill  . albuterol (PROVENTIL HFA;VENTOLIN HFA) 108 (90 Base) MCG/ACT inhaler Inhale 2 puffs into the lungs every 6 (six) hours as needed for wheezing or shortness of breath.    . ALPRAZolam (XANAX) 1 MG tablet Take 1 mg by mouth 4 (four) times daily.     [name] amphetamine-dextroamphetamine (ADDERALL) 20 MG tablet Take 20 mg by mouth 2 (two) times daily.   0  . BELSOMRA 20 MG TABS Take 20 mg by mouth at bedtime  3  . DULERA 200-5 MCG/ACT AERO TAKE 2 PUFFS BY MOUTH TWICE A DAY 13 Inhaler 0  . DULoxetine (CYMBALTA) 60 MG capsule Take 1 capsule (60 mg total) by mouth daily. 30 capsule 12  . enoxaparin (LOVENOX) 40 MG/0.4ML injection Inject 0.4 mLs (40 mg total) into the skin daily for 14 days. 14 Syringe 0  . EPIPEN 2-PAK 0.3 MG/0.3ML SOAJ injection USE AS DIRECTED FOR ANAPYLACTIC REACTION (TO BEE STINGS) 2 Device 11  . hydrOXYzine (ATARAX/VISTARIL) 25 MG tablet TAKE 1 TABLET (25 MG TOTAL) BY MOUTH EVERY 8 (EIGHT) HOURS AS NEEDED. (Patient taking differently: Take 25 mg by mouth every 6 hours as  needed for itching) 30 tablet 5  . LYRICA 100 MG capsule TAKE 1 TABLET BY MOUTH 3 TIMES A DAY 90 capsule 0  . Multiple Vitamin (MULITIVITAMIN WITH MINERALS) TABS Take 1 tablet by mouth daily.    [name] oxyCODONE (OXY IR/ROXICODONE) 5 MG immediate release tablet Take 1 tablet (5 mg total) by mouth every 4 (four) hours as needed for moderate pain (pain score 4-6). 30 tablet 0  . oxycodone (OXY-IR) 5 MG capsule Take 1 capsule (5 mg total) by mouth every 4 (four) hours as needed. 15 capsule 0  . PROAIR HFA 108 (90 Base) MCG/ACT inhaler TAKE 2 PUFFS BY MOUTH EVERY 6 HOURS AS NEEDED FOR WHEEZE OR SHORTNESS OF BREATH 8.5 Inhaler 5  . promethazine (PHENERGAN) 12.5 MG suppository Place 12.5 mg rectally every 8 (eight) hours as needed for nausea or vomiting.   0  . QUEtiapine (SEROQUEL) 400 MG tablet Takes 800 mg by mouth at bedtime.  4   No current facility-administered medications for this visit.     Patient confirms/reports the following allergies:  Allergies  Allergen Reactions  . Bee Venom Anaphylaxis and Other (See Comments)    Bees/wasps/yellow jackets  . Keflex [Cephalexin] Anaphylaxis  . Penicillins Anaphylaxis and Other (See Comments)    Has patient had a PCN reaction causing immediate rash, facial/tongue/throat swelling, SOB or lightheadedness with hypotension: Yes Has patient had a PCN reaction causing severe rash involving mucus membranes or skin necrosis: No Has patient had a PCN reaction  that required hospitalization Yes, in the hospital already Has patient had a PCN reaction occurring within the last 10 years: Yes If all of the above answers are "NO", then may proceed with Cephalosporin use.   . Sulfa Antibiotics Other (See Comments)    Renal failure  . Hydrocodone Rash and Other (See Comments)    "I couldn't get my breath"  . Ibuprofen Other (See Comments)    Vomiting blood  . Contrast Media [Iodinated Diagnostic Agents] Other (See Comments)    Patient states she had a previous  reaction to iodinated contrast media agents. Premedicate.  . Nsaids Itching  . Codeine Itching and Rash  . Cyclobenzaprine Other (See Comments)    Severe constipation  . Fentanyl Nausea And Vomiting and Other (See Comments)    Severe vomiting  . Methadone Other (See Comments)    Change in mental status  . Neurontin [Gabapentin] Hives  . Tape Itching, Rash and Other (See Comments)    Blisters skin, Please use "paper" tape  . Tegretol [Carbamazepine] Hives and Rash  . Toradol [Ketorolac Tromethamine] Hives and Rash  . Tramadol Hives and Rash  . Tylenol [Acetaminophen] Rash and Other (See Comments)    Vomiting blood    No orders of the defined types were placed in this encounter.   AUTHORIZATION INFORMATION Primary Insurance: 1D#: Group #:  Secondary Insurance: 1D#: Group #:  SCHEDULE INFORMATION: Date: Tuesday 07/04/19 Time: Location:

## 2019-06-30 ENCOUNTER — Other Ambulatory Visit: Admission: RE | Admit: 2019-06-30 | Payer: Medicaid Other | Source: Ambulatory Visit

## 2019-06-30 ENCOUNTER — Other Ambulatory Visit: Payer: Self-pay | Admitting: *Deleted

## 2019-06-30 ENCOUNTER — Telehealth: Payer: Self-pay

## 2019-06-30 DIAGNOSIS — Z20822 Contact with and (suspected) exposure to covid-19: Secondary | ICD-10-CM

## 2019-06-30 NOTE — Telephone Encounter (Signed)
Contacted patient this morning to remind that she needed to have her COVID test today and stop by the office to pick up her bowel prep and colonoscopy instructions. Also told her that the office closes at 12 today.  Thanks [employer]

## 2019-06-30 NOTE — Progress Notes (Signed)
Came through the [place] testing site and wanted the bowel prep. Stated that she already got covid tested at the visitor entrance covid site and she wasn't going to get tested again. After explanation and discussion, was directed to the [facility] to pick up the bowel prep there.Covid test was not performed at the [facility] site.

## 2019-07-01 LAB — NOVEL CORONAVIRUS, NAA: SARS-CoV-2, NAA: NOT DETECTED

## 2019-07-04 ENCOUNTER — Other Ambulatory Visit: Payer: Self-pay

## 2019-07-04 ENCOUNTER — Ambulatory Visit
Admission: RE | Admit: 2019-07-04 | Discharge: 2019-07-04 | Disposition: A | Discharge status: Home / Self Care | Payer: Medicaid Other | Source: Ambulatory Visit | Attending: Gastroenterology | Admitting: Gastroenterology

## 2019-07-04 ENCOUNTER — Ambulatory Visit: Payer: Medicaid Other | Admitting: Anesthesiology

## 2019-07-04 ENCOUNTER — Encounter: Payer: Self-pay | Admitting: *Deleted

## 2019-07-04 ENCOUNTER — Encounter
Admission: RE | Disposition: A | Discharge status: Home / Self Care | Payer: Self-pay | Source: Ambulatory Visit | Attending: Gastroenterology

## 2019-07-04 DIAGNOSIS — F909 Attention-deficit hyperactivity disorder, unspecified type: Secondary | ICD-10-CM | POA: Diagnosis not present

## 2019-07-04 DIAGNOSIS — Z96612 Presence of left artificial shoulder joint: Secondary | ICD-10-CM | POA: Diagnosis not present

## 2019-07-04 DIAGNOSIS — Z8614 Personal history of Methicillin resistant Staphylococcus aureus infection: Secondary | ICD-10-CM | POA: Diagnosis not present

## 2019-07-04 DIAGNOSIS — Z1211 Encounter for screening for malignant neoplasm of colon: Secondary | ICD-10-CM | POA: Insufficient documentation

## 2019-07-04 DIAGNOSIS — F419 Anxiety disorder, unspecified: Secondary | ICD-10-CM | POA: Diagnosis not present

## 2019-07-04 DIAGNOSIS — M797 Fibromyalgia: Secondary | ICD-10-CM | POA: Diagnosis not present

## 2019-07-04 DIAGNOSIS — E039 Hypothyroidism, unspecified: Secondary | ICD-10-CM | POA: Diagnosis not present

## 2019-07-04 DIAGNOSIS — Z825 Family history of asthma and other chronic lower respiratory diseases: Secondary | ICD-10-CM | POA: Insufficient documentation

## 2019-07-04 DIAGNOSIS — K621 Rectal polyp: Secondary | ICD-10-CM | POA: Diagnosis not present

## 2019-07-04 DIAGNOSIS — J449 Chronic obstructive pulmonary disease, unspecified: Secondary | ICD-10-CM | POA: Diagnosis not present

## 2019-07-04 DIAGNOSIS — Z87891 Personal history of nicotine dependence: Secondary | ICD-10-CM | POA: Diagnosis not present

## 2019-07-04 DIAGNOSIS — Z90710 Acquired absence of both cervix and uterus: Secondary | ICD-10-CM | POA: Diagnosis not present

## 2019-07-04 DIAGNOSIS — K635 Polyp of colon: Secondary | ICD-10-CM | POA: Diagnosis not present

## 2019-07-04 DIAGNOSIS — Z96653 Presence of artificial knee joint, bilateral: Secondary | ICD-10-CM | POA: Diagnosis not present

## 2019-07-04 DIAGNOSIS — Z86010 Personal history of colonic polyps: Secondary | ICD-10-CM | POA: Insufficient documentation

## 2019-07-04 DIAGNOSIS — Z791 Long term (current) use of non-steroidal anti-inflammatories (NSAID): Secondary | ICD-10-CM | POA: Diagnosis not present

## 2019-07-04 DIAGNOSIS — Z880 Allergy status to penicillin: Secondary | ICD-10-CM | POA: Insufficient documentation

## 2019-07-04 DIAGNOSIS — G8929 Other chronic pain: Secondary | ICD-10-CM | POA: Diagnosis not present

## 2019-07-04 DIAGNOSIS — Z886 Allergy status to analgesic agent status: Secondary | ICD-10-CM | POA: Insufficient documentation

## 2019-07-04 DIAGNOSIS — Z888 Allergy status to other drugs, medicaments and biological substances status: Secondary | ICD-10-CM | POA: Insufficient documentation

## 2019-07-04 DIAGNOSIS — Z882 Allergy status to sulfonamides status: Secondary | ICD-10-CM | POA: Insufficient documentation

## 2019-07-04 DIAGNOSIS — Z87442 Personal history of urinary calculi: Secondary | ICD-10-CM | POA: Diagnosis not present

## 2019-07-04 DIAGNOSIS — Z981 Arthrodesis status: Secondary | ICD-10-CM | POA: Insufficient documentation

## 2019-07-04 DIAGNOSIS — Z7951 Long term (current) use of inhaled steroids: Secondary | ICD-10-CM | POA: Diagnosis not present

## 2019-07-04 DIAGNOSIS — D125 Benign neoplasm of sigmoid colon: Secondary | ICD-10-CM | POA: Insufficient documentation

## 2019-07-04 DIAGNOSIS — Z91030 Bee allergy status: Secondary | ICD-10-CM | POA: Insufficient documentation

## 2019-07-04 DIAGNOSIS — M549 Dorsalgia, unspecified: Secondary | ICD-10-CM | POA: Insufficient documentation

## 2019-07-04 DIAGNOSIS — M1990 Unspecified osteoarthritis, unspecified site: Secondary | ICD-10-CM | POA: Insufficient documentation

## 2019-07-04 DIAGNOSIS — Z823 Family history of stroke: Secondary | ICD-10-CM | POA: Insufficient documentation

## 2019-07-04 DIAGNOSIS — G709 Myoneural disorder, unspecified: Secondary | ICD-10-CM | POA: Insufficient documentation

## 2019-07-04 DIAGNOSIS — Z91048 Other nonmedicinal substance allergy status: Secondary | ICD-10-CM | POA: Insufficient documentation

## 2019-07-04 DIAGNOSIS — Z801 Family history of malignant neoplasm of trachea, bronchus and lung: Secondary | ICD-10-CM | POA: Insufficient documentation

## 2019-07-04 DIAGNOSIS — F319 Bipolar disorder, unspecified: Secondary | ICD-10-CM | POA: Diagnosis not present

## 2019-07-04 DIAGNOSIS — I739 Peripheral vascular disease, unspecified: Secondary | ICD-10-CM | POA: Diagnosis not present

## 2019-07-04 DIAGNOSIS — Z8249 Family history of ischemic heart disease and other diseases of the circulatory system: Secondary | ICD-10-CM | POA: Insufficient documentation

## 2019-07-04 DIAGNOSIS — Z885 Allergy status to narcotic agent status: Secondary | ICD-10-CM | POA: Insufficient documentation

## 2019-07-04 DIAGNOSIS — Z79899 Other long term (current) drug therapy: Secondary | ICD-10-CM | POA: Insufficient documentation

## 2019-07-04 HISTORY — PX: COLONOSCOPY WITH PROPOFOL: SHX5780

## 2019-07-04 SURGERY — COLONOSCOPY WITH PROPOFOL
Anesthesia: General

## 2019-07-04 MED ORDER — VASOPRESSIN 20 UNIT/ML IV SOLN
INTRAVENOUS | Status: DC | PRN
  Administered 2019-07-04 (×2): 2 [IU] via INTRAVENOUS

## 2019-07-04 MED ORDER — EPHEDRINE SULFATE 50 MG/ML IJ SOLN
INTRAMUSCULAR | Status: DC | PRN
  Administered 2019-07-04 (×2): 10 mg via INTRAVENOUS

## 2019-07-04 MED ORDER — PHENYLEPHRINE HCL (PRESSORS) 10 MG/ML IV SOLN
INTRAVENOUS | Status: DC | PRN
  Administered 2019-07-04: 100 ug via INTRAVENOUS
  Administered 2019-07-04: 200 ug via INTRAVENOUS

## 2019-07-04 MED ORDER — PROPOFOL 500 MG/50ML IV EMUL
INTRAVENOUS | Status: DC | PRN
  Administered 2019-07-04: 140 ug/kg/min via INTRAVENOUS

## 2019-07-04 MED ORDER — SODIUM CHLORIDE 0.9 % IV SOLN
INTRAVENOUS | Status: DC
  Administered 2019-07-04: 10:00:00 via INTRAVENOUS

## 2019-07-04 NOTE — Anesthesia Preprocedure Evaluation (Signed)
Anesthesia Evaluation  Patient identified by MRN, date of birth, ID band Patient awake    Reviewed: Allergy & Precautions, H&P , NPO status , Patient's Chart, lab work & pertinent test results  History of Anesthesia Complications Negative for: history of anesthetic complications  Airway Mallampati: III  TM Distance: >3 FB Neck ROM: Limited    Dental  (+) Upper Dentures, Lower Dentures, Poor Dentition   Pulmonary pneumonia, resolved, COPD,  COPD inhaler, Current Smoker, former smoker,    breath sounds clear to auscultation       Cardiovascular + Peripheral Vascular Disease  negative cardio ROS   Rhythm:Regular Rate:Normal     Neuro/Psych PSYCHIATRIC DISORDERS Anxiety Depression Bipolar Disorder  Neuromuscular disease    GI/Hepatic negative GI ROS, neg GERD  ,(+) Hepatitis -, C  Endo/Other  Hypothyroidism   Renal/GU CRFRenal disease  negative genitourinary   Musculoskeletal  (+) Arthritis , Osteoarthritis,  Fibromyalgia -  Abdominal   Peds negative pediatric ROS (+)  Hematology   Anesthesia Other Findings Past Medical History: No date: Abscess No date: ADHD No date: Allergy No date: Anxiety No date: Arthritis No date: Bipolar disorder ([place]) No date: Chronic back pain 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 No date: COPD (chronic obstructive pulmonary disease) ([facility]) No date: Fibromyalgia No date: History of kidney stones No date: Kidney failure     Comment:  acute - reaction to sulfa drugs No date: MRSA (methicillin resistant staph aureus) culture positive     Comment:  Hx: of No date: Muscle spasm     Comment:  arms No date: Muscle spasms of both lower extremities No date: Numbness No date: Panic attack No date: Pneumonia     Comment:  2009 No date: Wears dentures     Comment:  full upper and lower  Patient can take dilaudid, demerol  Reproductive/Obstetrics                              Lab Results  Component Value Date   WBC 9.6 04/18/2018   HGB 12.6 04/18/2018   HCT 37.4 04/18/2018   MCV 91.5 04/18/2018   PLT 362 04/18/2018   Lab Results  Component Value Date   CREATININE 0.64 04/18/2018   BUN 10 04/18/2018   NA 140 04/18/2018   K 3.8 04/18/2018   CL 110 04/18/2018   CO2 23 04/18/2018   Lab Results  Component Value Date   INR 0.91 02/11/2018   INR 1.0 06/14/2015   INR 1.0 03/23/2013   02/2016 EKG: normal sinus rhythm.   Anesthesia Physical  Anesthesia Plan  ASA: III  Anesthesia Plan: General   Post-op Pain Management:    Induction: Intravenous  PONV Risk Score and Plan: Propofol infusion and TIVA  Airway Management Planned: Natural Airway and Nasal Cannula  Additional Equipment:   Intra-op Plan:   Post-operative Plan:   Informed Consent: I have reviewed the patients History and Physical, chart, labs and discussed the procedure including the risks, benefits and alternatives for the proposed anesthesia with the patient or authorized representative who has indicated his/her understanding and acceptance.     Dental Advisory Given  Plan Discussed with: CRNA  Anesthesia Plan Comments: (Patient consented for risks of anesthesia including but not limited to:  - adverse reactions to medications - risk of intubation if required - damage to teeth, lips or other oral mucosa - sore throat or hoarseness - Damage to heart, brain, lungs or  loss of life  Patient voiced understanding.)        Anesthesia Quick Evaluation

## 2019-07-04 NOTE — Anesthesia Postprocedure Evaluation (Signed)
Anesthesia Post Note  Patient: Jamie Schultz  Procedure(s) Performed: COLONOSCOPY WITH PROPOFOL (N/A )  Patient location during evaluation: Endoscopy Anesthesia Type: General Level of consciousness: awake and alert Pain management: pain level controlled Vital Signs Assessment: post-procedure vital signs reviewed and stable Respiratory status: spontaneous breathing, nonlabored ventilation, respiratory function stable and patient connected to nasal cannula oxygen Cardiovascular status: blood pressure returned to baseline and stable Postop Assessment: no apparent nausea or vomiting Anesthetic complications: no     Last Vitals:  Vitals:   07/04/19 1046 07/04/19 1116  BP: (!) 92/53 114/78  Pulse: 75   Resp: 12   Temp: (!) 36.4 C   SpO2: 94% 98%    Last Pain:  Vitals:   07/04/19 1116  TempSrc:   PainSc: Asleep                 Precious Haws Piscitello

## 2019-07-04 NOTE — Op Note (Signed)
[facility] Gastroenterology Patient Name: Jamie Schultz Procedure Date: 07/04/2019 10:07 AM MRN: [id number] Account #: [id number] Date of Birth: Nov 10, 1960 Admit Type: Outpatient Age: 58 Room: [facility] ENDO ROOM 4 Gender: Female Note Status: Finalized Procedure:             Colonoscopy Indications:           High risk colon cancer surveillance: Personal history                         of colonic polyps Providers:             [name] MD, MD Referring MD:          [facility] Medicines:             Propofol per Anesthesia Complications:         No immediate complications. Procedure:             Pre-Anesthesia Assessment:                        - Prior to the procedure, a History and Physical was                         performed, and patient medications and allergies were                         reviewed. The patient's tolerance of previous                         anesthesia was also reviewed. The risks and benefits                         of the procedure and the sedation options and risks                         were discussed with the patient. All questions were                         answered, and informed consent was obtained. Prior                         Anticoagulants: The patient has taken no previous                         anticoagulant or antiplatelet agents. ASA Grade                         Assessment: II - A patient with mild systemic disease.                         After reviewing the risks and benefits, the patient                         was deemed in satisfactory condition to undergo the                         procedure.                        After  obtaining informed consent, the colonoscope was                         passed under direct vision. Throughout the procedure,                         the patient's blood pressure, pulse, and oxygen                         saturations were monitored continuously. The   Colonoscope was introduced through the anus and                         advanced to the the cecum, identified by appendiceal                         orifice and ileocecal valve. The colonoscopy was                         performed without difficulty. The patient tolerated                         the procedure well. The quality of the bowel                         preparation was good. Findings:      The perianal and digital rectal examinations were normal.      A 3 mm polyp was found in the rectum. The polyp was sessile. The polyp       was removed with a cold snare. Resection and retrieval were complete.      Two sessile polyps were found in the sigmoid colon. The polyps were 3 to       5 mm in size. These polyps were removed with a cold snare. Resection and       retrieval were complete.      A segmental area of severely erythematous mucosa was found in the rectum. Impression:            - One 3 mm polyp in the rectum, removed with a cold                         snare. Resected and retrieved.                        - Two 3 to 5 mm polyps in the sigmoid colon, removed                         with a cold snare. Resected and retrieved.                        - Erythematous mucosa in the rectum. Recommendation:        - Discharge patient to home.                        - Resume previous diet.                        - Continue present medications.                        -  Await pathology results.                        - Repeat colonoscopy in 5 years for surveillance. Procedure Code(s):     --- Professional ---                        [phone], Colonoscopy, flexible; with removal of                         tumor(s), polyp(s), or other lesion(s) by snare                         technique Diagnosis Code(s):     --- Professional ---                        Z86.010, Personal history of colonic polyps                        K63.5, Polyp of colon                        K62.1, Rectal polyp CPT  copyright 2019 American Medical Association. All rights reserved. The codes documented in this report are preliminary and upon coder review may  be revised to meet current compliance requirements. [name] MD, MD 07/04/2019 10:45:09 AM This report has been signed electronically. Number of Addenda: 0 Note Initiated On: 07/04/2019 10:07 AM Scope Withdrawal Time: 0 hours 13 minutes 44 seconds  Total Procedure Duration: 0 hours 29 minutes 20 seconds  Estimated Blood Loss:  Estimated blood loss: none.      [facility]

## 2019-07-04 NOTE — Anesthesia Post-op Follow-up Note (Signed)
Anesthesia QCDR form completed.        
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
## 2019-07-04 NOTE — Transfer of Care (Signed)
Immediate Anesthesia Transfer of Care Note  Patient: Jamie Schultz  Procedure(s) Performed: COLONOSCOPY WITH PROPOFOL (N/A )  Patient Location: PACU  Anesthesia Type:General  Level of Consciousness: sedated  Airway & Oxygen Therapy: Patient Spontanous Breathing and Patient connected to nasal cannula oxygen  Post-op Assessment: Report given to RN and Post -op Vital signs reviewed and stable  Post vital signs: Reviewed and stable  Last Vitals:  Vitals Value Taken Time  BP 92/53 07/04/19 1046  Temp 36.4 C 07/04/19 1046  Pulse 73 07/04/19 1047  Resp 10 07/04/19 1047  SpO2 94 % 07/04/19 1047  Vitals shown include unvalidated device data.  Last Pain:  Vitals:   07/04/19 1046  TempSrc:   PainSc: Asleep         Complications: No apparent anesthesia complications

## 2019-07-05 ENCOUNTER — Encounter: Payer: Self-pay | Admitting: Gastroenterology

## 2019-07-05 LAB — SURGICAL PATHOLOGY

## 2019-07-06 ENCOUNTER — Encounter: Payer: Self-pay | Admitting: Gastroenterology

## 2019-07-23 ENCOUNTER — Ambulatory Visit: Payer: Self-pay | Admitting: General Surgery

## 2019-07-23 MED ORDER — DEXTROSE 5 % IV SOLN
900.0000 mg | INTRAVENOUS | Status: AC
  Administered 2019-10-13: 14:00:00 900 mg via INTRAVENOUS

## 2019-07-23 MED ORDER — GENTAMICIN SULFATE 40 MG/ML IJ SOLN: 5.0000 mg/kg | INTRAVENOUS | Status: DC

## 2019-08-28 NOTE — Patient Instructions (Addendum)
DUE TO COVID-19 ONLY ONE VISITOR IS ALLOWED TO COME WITH YOU AND STAY IN THE WAITING ROOM ONLY DURING PRE OP AND PROCEDURE DAY OF SURGERY. THE 1 VISITOR MAY VISIT WITH YOU AFTER SURGERY IN YOUR PRIVATE ROOM DURING VISITING HOURS ONLY!  YOU NEED TO HAVE A COVID 19 TEST ON: 09/02/2019 @__11 :05 AM  , THIS TEST MUST BE DONE BEFORE SURGERY, COME  Jamie Schultz, [address] , [address].  ([place]) ONCE YOUR COVID TEST IS COMPLETED, PLEASE BEGIN THE QUARANTINE INSTRUCTIONS AS OUTLINED IN YOUR HANDOUT.                Jamie Schultz    Your procedure is scheduled on: 09/06/2019   Report to [facility] Main  Entrance    Report to admitting at: 6:45 AM     Call this number if you have problems the morning of surgery [phone]    Remember: Drink plenty fluids the day before your surgery.    DRINK 2 PRESURGERY ENSURE DRINKS THE NIGHT BEFORE SURGERY AT  10:00 PM AND 1 PRESURGERY DRINK THE DAY OF THE PROCEDURE 3 HOURS PRIOR TO SCHEDULED SURGERY. NO SOLIDS AFTER MIDNIGHT THE DAY PRIOR TO THE SURGERY. NOTHING BY MOUTH EXCEPT CLEAR LIQUIDS UNTIL THREE HOURS PRIOR TO SCHEDULED SURGERY. PLEASE FINISH PRESURGERY ENSURE DRINK PER SURGEON ORDER 3 HOURS PRIOR TO SCHEDULED SURGERY TIME WHICH NEEDS TO BE COMPLETED AT: 5:45 AM     CLEAR LIQUID DIET   Foods Allowed                                                                     Foods Excluded  Coffee and tea, regular and decaf                             liquids that you cannot  Plain Jell-O any favor except red or purple                                           see through such as: Fruit ices (not with fruit pulp)                                     milk, soups, orange juice  Iced Popsicles                                    All solid food Carbonated beverages, regular and diet                                    Cranberry, grape and apple juices Sports drinks like Gatorade Lightly seasoned clear broth or consume(fat  free) Sugar, honey syrup  Sample Menu Breakfast  Lunch                                     Supper Cranberry juice                    Beef broth                            Chicken broth Jell-O                                     Grape juice                           Apple juice Coffee or tea                        Jell-O                                      Popsicle                                                Coffee or tea                        Coffee or tea  _____________________________________________________________________     Take these medicines the morning of surgery with A SIP OF WATER: Adderall,Cymbalta,Provastatin,Dulera,Xanax, Ondansetron as needed, and inhalers.  BRUSH YOUR TEETH MORNING OF SURGERY AND RINSE YOUR MOUTH OUT, NO CHEWING GUM CANDY OR MINTS.                               You may not have any metal on your body including hair pins and              piercings    Do not wear jewelry, make-up, lotions, powders or perfumes, deodorant             Do not wear nail polish on your fingernails.  Do not shave  48 hours prior to surgery.              Men may shave face and neck.   Do not bring valuables to the hospital. [place].  Contacts, dentures or bridgework may not be worn into surgery.  Leave suitcase in the car. After surgery it may be brought to your room.     Patients discharged the day of surgery will not be allowed to drive home. IF YOU ARE HAVING SURGERY AND GOING HOME THE SAME DAY, YOU MUST HAVE AN ADULT TO DRIVE YOU HOME AND BE WITH YOU FOR 24 HOURS. YOU MAY GO HOME BY TAXI OR UBER OR ORTHERWISE, BUT AN ADULT MUST ACCOMPANY YOU HOME AND STAY WITH YOU FOR 24 HOURS.  Name and phone number of your driver:  Special Instructions: N/A  Please read over the following fact sheets you were  given: _____________________________________________________________________               [facility] - Preparing for Surgery Before surgery, you can play an important role.  Because skin is not sterile, your skin needs to be as free of germs as possible.  You can reduce the number of germs on your skin by washing with CHG (chlorahexidine gluconate) soap before surgery.  CHG is an antiseptic cleaner which kills germs and bonds with the skin to continue killing germs even after washing. Please DO NOT use if you have an allergy to CHG or antibacterial soaps.  If your skin becomes reddened/irritated stop using the CHG and inform your nurse when you arrive at Short Stay. Do not shave (including legs and underarms) for at least 48 hours prior to the first CHG shower.  You may shave your face/neck. Please follow these instructions carefully:  1.  Shower with CHG Soap the night before surgery and the  morning of Surgery.  2.  If you choose to wash your hair, wash your hair first as usual with your  normal  shampoo.  3.  After you shampoo, rinse your hair and body thoroughly to remove the  shampoo.                           4.  Use CHG as you would any other liquid soap.  You can apply chg directly  to the skin and wash                       Gently with a scrungie or clean washcloth.  5.  Apply the CHG Soap to your body ONLY FROM THE NECK DOWN.   Do not use on face/ open                           Wound or open sores. Avoid contact with eyes, ears mouth and genitals (private parts).                       Wash face,  Genitals (private parts) with your normal soap.             6.  Wash thoroughly, paying special attention to the area where your surgery  will be performed.  7.  Thoroughly rinse your body with warm water from the neck down.  8.  DO NOT shower/wash with your normal soap after using and rinsing off  the CHG Soap.                9.  Pat yourself dry with a clean towel.            10.  Wear  clean pajamas.            11.  Place clean sheets on your bed the night of your first shower and do not  sleep with pets. Day of Surgery : Do not apply any lotions/deodorants the morning of surgery.  Please wear clean clothes to the hospital/surgery center.  FAILURE TO FOLLOW THESE INSTRUCTIONS MAY RESULT IN THE CANCELLATION OF YOUR SURGERY PATIENT SIGNATURE_________________________________  NURSE SIGNATURE__________________________________  ________________________________________________________________________   Jamie Schultz  An incentive spirometer is a tool that can help keep your lungs clear and active. This tool measures how well you are filling your lungs with each  breath. Taking long deep breaths may help reverse or decrease the chance of developing breathing (pulmonary) problems (especially infection) following:  A long period of time when you are unable to move or be active. BEFORE THE PROCEDURE   If the spirometer includes an indicator to show your best effort, your nurse or respiratory therapist will set it to a desired goal.  If possible, sit up straight or lean slightly forward. Try not to slouch.  Hold the incentive spirometer in an upright position. INSTRUCTIONS FOR USE  1. Sit on the edge of your bed if possible, or sit up as far as you can in bed or on a chair. 2. Hold the incentive spirometer in an upright position. 3. Breathe out normally. 4. Place the mouthpiece in your mouth and seal your lips tightly around it. 5. Breathe in slowly and as deeply as possible, raising the piston or the ball toward the top of the column. 6. Hold your breath for 3-5 seconds or for as long as possible. Allow the piston or ball to fall to the bottom of the column. 7. Remove the mouthpiece from your mouth and breathe out normally. 8. Rest for a few seconds and repeat Steps 1 through 7 at least 10 times every 1-2 hours when you are awake. Take your time and take a few normal  breaths between deep breaths. 9. The spirometer may include an indicator to show your best effort. Use the indicator as a goal to work toward during each repetition. 10. After each set of 10 deep breaths, practice coughing to be sure your lungs are clear. If you have an incision (the cut made at the time of surgery), support your incision when coughing by placing a pillow or rolled up towels firmly against it. Once you are able to get out of bed, walk around indoors and cough well. You may stop using the incentive spirometer when instructed by your caregiver.  RISKS AND COMPLICATIONS  Take your time so you do not get dizzy or light-headed.  If you are in pain, you may need to take or ask for pain medication before doing incentive spirometry. It is harder to take a deep breath if you are having pain. AFTER USE  Rest and breathe slowly and easily.  It can be helpful to keep track of a log of your progress. Your caregiver can provide you with a simple table to help with this. If you are using the spirometer at home, follow these instructions: Jamie Schultz IF:   You are having difficultly using the spirometer.  You have trouble using the spirometer as often as instructed.  Your pain medication is not giving enough relief while using the spirometer.  You develop fever of 100.5 F (38.1 C) or higher. SEEK IMMEDIATE MEDICAL CARE IF:   You cough up bloody sputum that had not been present before.  You develop fever of 102 F (38.9 C) or greater.  You develop worsening pain at or near the incision site. MAKE SURE YOU:   Understand these instructions.  Will watch your condition.  Will get help right away if you are not doing well or get worse. Document Released: 12/28/2006 Document Revised: 11/09/2011 Document Reviewed: 02/28/2007 [facility] Patient Information 2014 [place], [place].   ________________________________________________________________________

## 2019-08-29 ENCOUNTER — Encounter (HOSPITAL_COMMUNITY)
Admission: RE | Admit: 2019-08-29 | Discharge: 2019-08-29 | Disposition: A | Discharge status: Home / Self Care | Payer: Medicaid Other | Source: Ambulatory Visit | Attending: General Surgery | Admitting: General Surgery

## 2019-08-29 ENCOUNTER — Other Ambulatory Visit: Payer: Self-pay

## 2019-08-29 ENCOUNTER — Encounter (HOSPITAL_COMMUNITY): Payer: Self-pay

## 2019-08-29 DIAGNOSIS — K623 Rectal prolapse: Secondary | ICD-10-CM | POA: Diagnosis not present

## 2019-08-29 DIAGNOSIS — Z01812 Encounter for preprocedural laboratory examination: Secondary | ICD-10-CM | POA: Insufficient documentation

## 2019-08-29 LAB — CBC
HCT: 42.4 % (ref 36.0–46.0)
Hemoglobin: 14.3 g/dL (ref 12.0–15.0)
MCH: 31.3 pg (ref 26.0–34.0)
MCHC: 33.7 g/dL (ref 30.0–36.0)
MCV: 92.8 fL (ref 80.0–100.0)
Platelets: 363 10*3/uL (ref 150–400)
RBC: 4.57 MIL/uL (ref 3.87–5.11)
RDW: 14.3 % (ref 11.5–15.5)
WBC: 10.8 10*3/uL — ABNORMAL HIGH (ref 4.0–10.5)
nRBC: 0 % (ref 0.0–0.2)

## 2019-08-29 LAB — BASIC METABOLIC PANEL
Anion gap: 9 (ref 5–15)
BUN: 19 mg/dL (ref 6–20)
CO2: 23 mmol/L (ref 22–32)
Calcium: 9.4 mg/dL (ref 8.9–10.3)
Chloride: 106 mmol/L (ref 98–111)
Creatinine, Ser: 0.94 mg/dL (ref 0.44–1.00)
GFR calc Af Amer: >60 mL/min (ref >60)
GFR calc non Af Amer: >60 mL/min (ref >60)
Glucose, Bld: 87 mg/dL (ref 70–99)
Potassium: 3.9 mmol/L (ref 3.5–5.1)
Sodium: 138 mmol/L (ref 135–145)

## 2019-08-29 NOTE — Progress Notes (Signed)
RN attempted to call pt. For phone interview three times,unable to rich pt. Message was left on answer machine.

## 2019-08-29 NOTE — Progress Notes (Signed)
PCP - Simona Huh.   Cardiologist -   Chest x-ray - 9/27. EPIC EKG - 9/28. EPIC Stress Test -  ECHO -  Cardiac Cath -  CT chest: 9/27 Sleep Study - N/A CPAP -   Fasting Blood Sugar - N/A Checks Blood Sugar _____ times a day  Blood Thinner Instructions:Lovenox is listed on pt's med's. But she said she used too many years ago after her back surgery. Aspirin Instructions:N/A Last Dose:  Anesthesia review:   Patient denies shortness of breath, fever, cough and chest pain at PAT appointment   Patient verbalized understanding of instructions that were given to them at the PAT appointment. Patient was also instructed that they will need to review over the PAT instructions again at home before surgery.

## 2019-08-30 ENCOUNTER — Encounter (HOSPITAL_COMMUNITY): Admission: RE | Admit: 2019-08-30 | Payer: Medicaid Other | Source: Ambulatory Visit

## 2019-09-02 ENCOUNTER — Other Ambulatory Visit (HOSPITAL_COMMUNITY): Payer: Medicaid Other

## 2019-09-02 ENCOUNTER — Other Ambulatory Visit (HOSPITAL_COMMUNITY)
Admission: RE | Admit: 2019-09-02 | Discharge: 2019-09-02 | Disposition: A | Discharge status: Home / Self Care | Payer: Medicaid Other | Source: Ambulatory Visit | Attending: General Surgery | Admitting: General Surgery

## 2019-09-02 DIAGNOSIS — Z01812 Encounter for preprocedural laboratory examination: Secondary | ICD-10-CM | POA: Insufficient documentation

## 2019-09-02 DIAGNOSIS — Z20822 Contact with and (suspected) exposure to covid-19: Secondary | ICD-10-CM | POA: Diagnosis not present

## 2019-09-04 ENCOUNTER — Inpatient Hospital Stay (HOSPITAL_COMMUNITY): Payer: Medicaid Other | Admitting: Physician Assistant

## 2019-09-04 ENCOUNTER — Inpatient Hospital Stay (HOSPITAL_COMMUNITY): Payer: Medicaid Other | Admitting: Anesthesiology

## 2019-09-04 LAB — NOVEL CORONAVIRUS, NAA (HOSP ORDER, SEND-OUT TO REF LAB; TAT 18-24 HRS): SARS-CoV-2, NAA: NOT DETECTED

## 2019-09-05 MED ORDER — BUPIVACAINE LIPOSOME 1.3 % IJ SUSP
20.0000 mL | Freq: Once | INTRAMUSCULAR | Status: DC
  Filled 2019-09-05: qty 20

## 2019-09-05 NOTE — Anesthesia Preprocedure Evaluation (Addendum)
Anesthesia Evaluation  Patient identified by MRN, date of birth, ID band Patient awake    Reviewed: Allergy & Precautions, NPO status , Patient's Chart, lab work & pertinent test results  Airway Mallampati: II  TM Distance: >3 FB Neck ROM: Full    Dental  (+) Edentulous Upper, Edentulous Lower   Pulmonary COPD,  COPD inhaler, Patient abstained from smoking., former smoker,    Pulmonary exam normal breath sounds clear to auscultation       Cardiovascular Exercise Tolerance: Good negative cardio ROS Normal cardiovascular exam Rhythm:Regular Rate:Normal     Neuro/Psych PSYCHIATRIC DISORDERS Anxiety Depression  Neuromuscular disease    GI/Hepatic Neg liver ROS,  RECTAL PROLAPSE   Endo/Other  Hypothyroidism   Renal/GU negative Renal ROS     Musculoskeletal  (+) Arthritis , Fibromyalgia -  Abdominal   Peds  (+) ADHD Hematology negative hematology ROS (+)   Anesthesia Other Findings Day of surgery medications reviewed with the patient.  Reproductive/Obstetrics                           Anesthesia Physical Anesthesia Plan  ASA: III  Anesthesia Plan: General   Post-op Pain Management:    Induction: Intravenous  PONV Risk Score and Plan: 4 or greater and Diphenhydramine, Scopolamine patch - Pre-op, Midazolam, Dexamethasone and Ondansetron  Airway Management Planned: Oral ETT  Additional Equipment:   Intra-op Plan:   Post-operative Plan: Extubation in OR  Informed Consent: I have reviewed the patients History and Physical, chart, labs and discussed the procedure including the risks, benefits and alternatives for the proposed anesthesia with the patient or authorized representative who has indicated his/her understanding and acceptance.     Dental advisory given  Plan Discussed with: CRNA  Anesthesia Plan Comments: (CASE CANCELLED BY SURGEON FOR LACK OF BOWEL PREP)       Anesthesia Quick Evaluation

## 2019-09-06 ENCOUNTER — Encounter (HOSPITAL_COMMUNITY)
Admission: RE | Disposition: A | Discharge status: Home / Self Care | Payer: Self-pay | Source: Home / Self Care | Attending: General Surgery

## 2019-09-06 ENCOUNTER — Encounter (HOSPITAL_COMMUNITY): Payer: Self-pay | Admitting: General Surgery

## 2019-09-06 ENCOUNTER — Ambulatory Visit (HOSPITAL_COMMUNITY)
Admission: RE | Admit: 2019-09-06 | Discharge: 2019-09-06 | Disposition: A | Discharge status: Home / Self Care | Payer: Medicaid Other | Attending: General Surgery | Admitting: General Surgery

## 2019-09-06 ENCOUNTER — Other Ambulatory Visit: Payer: Self-pay

## 2019-09-06 DIAGNOSIS — F909 Attention-deficit hyperactivity disorder, unspecified type: Secondary | ICD-10-CM | POA: Diagnosis not present

## 2019-09-06 DIAGNOSIS — M797 Fibromyalgia: Secondary | ICD-10-CM | POA: Diagnosis not present

## 2019-09-06 DIAGNOSIS — J449 Chronic obstructive pulmonary disease, unspecified: Secondary | ICD-10-CM | POA: Insufficient documentation

## 2019-09-06 DIAGNOSIS — Z79891 Long term (current) use of opiate analgesic: Secondary | ICD-10-CM | POA: Diagnosis not present

## 2019-09-06 DIAGNOSIS — F419 Anxiety disorder, unspecified: Secondary | ICD-10-CM | POA: Insufficient documentation

## 2019-09-06 DIAGNOSIS — Z90710 Acquired absence of both cervix and uterus: Secondary | ICD-10-CM | POA: Diagnosis not present

## 2019-09-06 DIAGNOSIS — Z87891 Personal history of nicotine dependence: Secondary | ICD-10-CM | POA: Insufficient documentation

## 2019-09-06 DIAGNOSIS — Z91041 Radiographic dye allergy status: Secondary | ICD-10-CM | POA: Diagnosis not present

## 2019-09-06 DIAGNOSIS — Z79899 Other long term (current) drug therapy: Secondary | ICD-10-CM | POA: Diagnosis not present

## 2019-09-06 DIAGNOSIS — Z888 Allergy status to other drugs, medicaments and biological substances status: Secondary | ICD-10-CM | POA: Insufficient documentation

## 2019-09-06 DIAGNOSIS — Z886 Allergy status to analgesic agent status: Secondary | ICD-10-CM | POA: Diagnosis not present

## 2019-09-06 DIAGNOSIS — G8929 Other chronic pain: Secondary | ICD-10-CM | POA: Diagnosis not present

## 2019-09-06 DIAGNOSIS — Z882 Allergy status to sulfonamides status: Secondary | ICD-10-CM | POA: Insufficient documentation

## 2019-09-06 DIAGNOSIS — M1990 Unspecified osteoarthritis, unspecified site: Secondary | ICD-10-CM | POA: Diagnosis not present

## 2019-09-06 DIAGNOSIS — Z880 Allergy status to penicillin: Secondary | ICD-10-CM | POA: Insufficient documentation

## 2019-09-06 DIAGNOSIS — F319 Bipolar disorder, unspecified: Secondary | ICD-10-CM | POA: Diagnosis not present

## 2019-09-06 DIAGNOSIS — K623 Rectal prolapse: Secondary | ICD-10-CM | POA: Diagnosis not present

## 2019-09-06 DIAGNOSIS — E039 Hypothyroidism, unspecified: Secondary | ICD-10-CM | POA: Insufficient documentation

## 2019-09-06 DIAGNOSIS — Z885 Allergy status to narcotic agent status: Secondary | ICD-10-CM | POA: Insufficient documentation

## 2019-09-06 DIAGNOSIS — Z5309 Procedure and treatment not carried out because of other contraindication: Secondary | ICD-10-CM | POA: Diagnosis not present

## 2019-09-06 DIAGNOSIS — Z881 Allergy status to other antibiotic agents status: Secondary | ICD-10-CM | POA: Insufficient documentation

## 2019-09-06 SURGERY — RECTOPEXY, ROBOT-ASSISTED
Anesthesia: General

## 2019-09-06 MED ORDER — PHENYLEPHRINE HCL (PRESSORS) 10 MG/ML IV SOLN
INTRAVENOUS | Status: AC
  Filled 2019-09-06: qty 1

## 2019-09-06 MED ORDER — EPHEDRINE 5 MG/ML INJ
INTRAVENOUS | Status: AC
  Filled 2019-09-06: qty 10

## 2019-09-06 MED ORDER — DEXAMETHASONE SODIUM PHOSPHATE 10 MG/ML IJ SOLN
INTRAMUSCULAR | Status: AC
  Filled 2019-09-06: qty 1

## 2019-09-06 MED ORDER — SCOPOLAMINE 1 MG/3DAYS TD PT72
1.0000 | MEDICATED_PATCH | Freq: Once | TRANSDERMAL | Status: DC
  Administered 2019-09-06: 1.5 mg via TRANSDERMAL
  Filled 2019-09-06: qty 1

## 2019-09-06 MED ORDER — ONDANSETRON HCL 4 MG/2ML IJ SOLN
INTRAMUSCULAR | Status: AC
  Filled 2019-09-06: qty 2

## 2019-09-06 MED ORDER — PHENYLEPHRINE 40 MCG/ML (10ML) SYRINGE FOR IV PUSH (FOR BLOOD PRESSURE SUPPORT)
PREFILLED_SYRINGE | INTRAVENOUS | Status: AC
  Filled 2019-09-06: qty 10

## 2019-09-06 MED ORDER — LACTATED RINGERS IV SOLN: INTRAVENOUS | Status: DC

## 2019-09-06 MED ORDER — KETAMINE HCL 10 MG/ML IJ SOLN
INTRAMUSCULAR | Status: AC
  Filled 2019-09-06: qty 1

## 2019-09-06 MED ORDER — MIDAZOLAM HCL 2 MG/2ML IJ SOLN
INTRAMUSCULAR | Status: AC
  Filled 2019-09-06: qty 2

## 2019-09-06 MED ORDER — LIDOCAINE 2% (20 MG/ML) 5 ML SYRINGE
INTRAMUSCULAR | Status: AC
  Filled 2019-09-06: qty 5

## 2019-09-06 MED ORDER — FENTANYL CITRATE (PF) 250 MCG/5ML IJ SOLN
INTRAMUSCULAR | Status: AC
  Filled 2019-09-06: qty 5

## 2019-09-06 MED ORDER — PROPOFOL 10 MG/ML IV BOLUS
INTRAVENOUS | Status: AC
  Filled 2019-09-06: qty 40

## 2019-09-06 MED ORDER — ROCURONIUM BROMIDE 10 MG/ML (PF) SYRINGE
PREFILLED_SYRINGE | INTRAVENOUS | Status: AC
  Filled 2019-09-06: qty 10

## 2019-09-06 MED ORDER — ALVIMOPAN 12 MG PO CAPS
12.0000 mg | ORAL_CAPSULE | ORAL | Status: AC
  Administered 2019-09-06: 12 mg via ORAL
  Filled 2019-09-06: qty 1

## 2019-09-06 MED ORDER — LIDOCAINE HCL 2 % IJ SOLN
INTRAMUSCULAR | Status: AC
  Filled 2019-09-06: qty 20

## 2019-09-06 MED ORDER — HEPARIN SODIUM (PORCINE) 5000 UNIT/ML IJ SOLN
5000.0000 [IU] | Freq: Once | INTRAMUSCULAR | Status: AC
  Administered 2019-09-06: 5000 [IU] via SUBCUTANEOUS
  Filled 2019-09-06: qty 1

## 2019-09-06 MED ORDER — SUCCINYLCHOLINE CHLORIDE 200 MG/10ML IV SOSY
PREFILLED_SYRINGE | INTRAVENOUS | Status: AC
  Filled 2019-09-06: qty 10

## 2019-09-06 NOTE — H&P (Signed)
History of Present Illness  The patient is a 59 year old female who presents with rectal prolapse. 59 year old female with multiple site diagnosis and chronic pain disorder who presents to the office today with complaints of rectal prolapse. She states that this is been present for approximately 15 years. It has gotten worse over the past few years, causing significant pain and rectal bleeding. She takes chronic narcotics for multiple pain disorders. She reports a long-standing history of constipation and straining. She states that when she takes her Linzess, she is able to have a bowel movement without difficulty. Surgical history significant for hysterectomy and umbilical hernia repair.   Past Surgical History  Hemorrhoidectomy  Oral Surgery   Diagnostic Studies History  Colonoscopy  1-5 years ago Mammogram  within last year  Allergies  Keflex *CEPHALOSPORINS*  Anaphylaxis. Penicillins  Anaphylaxis. Sulfa Drugs  HYDROcodone Bitartrate ER *ANALGESICS - OPIOID*  Rash. NSAIDs  Iodinated Contrast Media  Codeine/Codeine Derivatives  Methadone *CHEMICALS*  fentaNYL *ANALGESICS - OPIOID*  Nausea, Vomiting. Neurontin *ANTICONVULSANTS*  Hives. traMADol HCl *ANALGESICS - OPIOID*  Hives, Rash. Toradol *ANALGESICS - ANTI-INFLAMMATORY*  Tylenol *ANALGESICS - NonNarcotic*  Cyclobenzaprine *CHEMICALS*  Adhesive Tape   Medication History  oxyCODONE HCl (10MG  Tablet, Oral) Active. Belsomra (20MG  Tablet, Oral) Active. LORazepam (1MG  Tablet, Oral) Active. Pregabalin (100MG  Capsule, Oral) Active. QUEtiapine Fumarate (400MG  Tablet, Oral) Active. Albuterol (90MCG/ACT Aerosol Soln, Inhalation) Active. Dulera (200-5MCG/ACT Aerosol, Inhalation) Active. Cymbalta (60MG  Capsule DR Part, Oral) Active. EpiPen 2-Pak (0.3MG /0.3ML Soln Auto-inj, Injection) Active. hydrOXYzine HCl (25MG  Tablet, Oral) Active. Medications Reconciled  Social History Mammie Lorenzo, LPN;  624THL QA348G AM) Caffeine use  Carbonated beverages. No alcohol use  No drug use  Tobacco use  Former smoker.  Family History Mammie Lorenzo, LPN; 624THL QA348G AM) First Degree Relatives  No pertinent family history   Pregnancy / Birth History Mammie Lorenzo, LPN; 624THL QA348G AM) Age at menarche  7 years. Age of menopause  54-55 Gravida  1 Irregular periods  Maternal age  85-40 Para  1  Other Problems Mammie Lorenzo, LPN; 624THL QA348G AM) Anxiety Disorder  Back Pain  Gastroesophageal Reflux Disease     Review of Systems General Not Present- Appetite Loss, Chills, Fatigue, Fever, Night Sweats, Weight Gain and Weight Loss. Skin Not Present- Change in Wart/Mole, Dryness, Hives, Jaundice, New Lesions, Non-Healing Wounds, Rash and Ulcer. HEENT Not Present- Earache, Hearing Loss, Hoarseness, Nose Bleed, Oral Ulcers, Ringing in the Ears, Seasonal Allergies, Sinus Pain, Sore Throat, Visual Disturbances, Wears glasses/contact lenses and Yellow Eyes. Respiratory Not Present- Bloody sputum, Chronic Cough, Difficulty Breathing, Snoring and Wheezing. Breast Not Present- Breast Mass, Breast Pain, Nipple Discharge and Skin Changes. Cardiovascular Present- Leg Cramps. Not Present- Chest Pain, Difficulty Breathing Lying Down, Palpitations, Rapid Heart Rate, Shortness of Breath and Swelling of Extremities. Gastrointestinal Present- Abdominal Pain, Bloating, Bloody Stool, Constipation, Nausea and Rectal Pain. Not Present- Change in Bowel Habits, Chronic diarrhea, Difficulty Swallowing, Excessive gas, Gets full quickly at meals, Hemorrhoids, Indigestion and Vomiting. Female Genitourinary Present- Pelvic Pain and Urgency. Not Present- Frequency, Nocturia and Painful Urination. Musculoskeletal Present- Back Pain, Joint Pain, Joint Stiffness, Muscle Pain and Muscle Weakness. Not Present- Swelling of Extremities. Neurological Present- Decreased Memory, Fainting, Headaches,  Numbness, Tingling and Weakness. Not Present- Seizures, Tremor and Trouble walking. Psychiatric Present- Anxiety and Change in Sleep Pattern. Not Present- Bipolar, Depression, Fearful and Frequent crying. Endocrine Not Present- Cold Intolerance, Excessive Hunger, Hair Changes, Heat Intolerance, Hot flashes and New Diabetes. Hematology Not Present- Blood Thinners,  Easy Bruising, Excessive bleeding, Gland problems, HIV and Persistent Infections.  BP (!) 139/99   Pulse 85   Temp 98.2 F (36.8 C) (Oral)   Resp 18   Ht 5' (1.524 m)   Wt 54.6 kg   BMI 23.51 kg/m     Physical Exam  General Mental Status-Alert. General Appearance-Not in acute distress. Build & Nutrition-Well nourished. Posture-Normal posture. Gait-Normal.  Head and Neck Head-normocephalic, atraumatic with no lesions or palpable masses. Trachea-midline.  Chest and Lung Exam Chest and lung exam reveals -on auscultation, normal breath sounds, no adventitious sounds and normal vocal resonance.  Cardiovascular Cardiovascular examination reveals -normal heart sounds, regular rate and rhythm with no murmurs and no digital clubbing, cyanosis, edema, increased warmth or tenderness.  Abdomen Inspection Inspection of the abdomen reveals - No Hernias. Palpation/Percussion Palpation and Percussion of the abdomen reveal - Soft, Non Tender and No Rigidity (guarding).  Rectal Anorectal Exam External - normal external exam. Note: Approximally 5 cm of complete rectal prolapse present with Valsalva. Internal - decreased sphincter tone.  Neurologic Neurologic evaluation reveals -alert and oriented x 3 with no impairment of recent or remote memory, normal attention span and ability to concentrate, normal sensation and normal coordination.  Musculoskeletal Normal Exam - Bilateral-Upper Extremity Strength Normal and Lower Extremity Strength Normal.   Name Value Date ANOSCOPY, DIAGNOSTIC [id number]) [  Hemorrhoids ] Procedure Other: Procedure: Anoscopy....Marland KitchenMarland KitchenSurgeon: Marcello Moores....Marland KitchenMarland KitchenAfter the risks and benefits were explained, verbal consent was obtained for above procedure. A medical assistant chaperone was present thoroughout the entire procedure. ....Marland KitchenMarland KitchenAnesthesia: none....Marland KitchenMarland KitchenDiagnosis: rectal prolapse....Marland KitchenMarland KitchenFindings: No masses, minimal hemorrhoid tissue, distal proctitis noted      Assessment & Plan  RECTAL PROLAPSE (K62.3) Impression: 59 year old female who presents to the office with complaints of rectal prolapse. She states that this is been present for approximately 15 years. On exam today, she does have approximately 5 cm of rectal prolapse on Valsalva. Surgical history significant for hysterectomy and umbilical hernia repair. Patient has bipolar disorder and chronic pain issues. She is on chronic narcotics. She reports chronic constipation and uses oxycodone and THC for this. Given that her last colonoscopy was approximately 4 years ago and she is due for repeat colonoscopy next year, I would recommend that we go ahead and repeat this before we do any major colon surgery. She would need a robotic rectopexy with sigmoid resection to correct her defect. We discussed that if she continues to have constipation and straining. Afterwards, this could result in a deadly leak of her anastomosis or recurrence of her prolapse. I believe she understands this and wishes to proceed. We also discussed that I cannot give her narcotics after discharge, since she has a pain contract with another doctor. We discussed the risk of fecal incontinence after surgery especially if she has loose stools. We discussed possible nerve damage causing bladder and sexual dysfunction. Current Plans

## 2019-09-13 ENCOUNTER — Ambulatory Visit: Payer: Self-pay | Admitting: General Surgery

## 2019-09-13 MED ORDER — GENTAMICIN SULFATE 40 MG/ML IJ SOLN
5.0000 mg/kg | INTRAVENOUS | Status: AC
  Administered 2019-10-13: 273 mg via INTRAVENOUS

## 2019-09-13 MED ORDER — DEXTROSE 5 % IV SOLN: 900.0000 mg | INTRAVENOUS | Status: DC

## 2019-10-10 ENCOUNTER — Other Ambulatory Visit: Payer: Self-pay

## 2019-10-10 ENCOUNTER — Other Ambulatory Visit
Admission: RE | Admit: 2019-10-10 | Discharge: 2019-10-10 | Disposition: A | Discharge status: Home / Self Care | Payer: Medicaid Other | Source: Ambulatory Visit | Attending: General Surgery | Admitting: General Surgery

## 2019-10-10 DIAGNOSIS — Z01812 Encounter for preprocedural laboratory examination: Secondary | ICD-10-CM | POA: Insufficient documentation

## 2019-10-10 DIAGNOSIS — Z20822 Contact with and (suspected) exposure to covid-19: Secondary | ICD-10-CM | POA: Diagnosis not present

## 2019-10-10 LAB — SARS CORONAVIRUS 2 (TAT 6-24 HRS): SARS Coronavirus 2: NEGATIVE

## 2019-10-10 NOTE — Patient Instructions (Addendum)
DUE TO COVID-19 ONLY ONE VISITOR IS ALLOWED TO COME WITH YOU AND STAY IN THE WAITING ROOM ONLY DURING PRE OP AND PROCEDURE DAY OF SURGERY. THE 1 VISITOR MAY VISIT WITH YOU AFTER SURGERY IN YOUR PRIVATE ROOM DURING VISITING HOURS ONLY!   ONCE YOUR COVID TEST IS COMPLETED, PLEASE BEGIN THE QUARANTINE INSTRUCTIONS AS OUTLINED IN YOUR HANDOUT.                Jamie Schultz     Your procedure is scheduled on: Friday 10/13/2019   Report to [facility] Main  Entrance    Report to admitting at  1245  PM     Call this number if you have problems the morning of surgery [phone]    Please follow bowel prep instructions from Dr. Marcello Moores office on Thursday 10/12/2019 and follow a clear liquid diet all day     DRINK 2 PRESURGERY ENSURE DRINKS THE NIGHT BEFORE SURGERY AT1000 PM AND 1 PRESURGERY DRINK THE DAY OF THE PROCEDURE 3 HOURS PRIOR TO SCHEDULED SURGERY.    NO SOLIDS AFTER MIDNIGHT THE DAY PRIOR TO THE SURGERY. NOTHING BY MOUTH EXCEPT CLEAR LIQUIDS UNTIL THREE HOURS PRIOR TO SCHEDULED SURGERY.    PLEASE FINISH PRESURGERY ENSURE DRINK PER SURGEON ORDER 3 HOURS PRIOR TO SCHEDULED SURGERY TIME WHICH NEEDS TO BE COMPLETED AT  1145 am.    CLEAR LIQUID DIET   Foods Allowed                                                                     Foods Excluded  Coffee and tea, regular and decaf                             liquids that you cannot  Plain Jell-O any favor except red or purple                                           see through such as: Fruit ices (not with fruit pulp)                                     milk, soups, orange juice  Iced Popsicles                                    All solid food Carbonated beverages, regular and diet                                    Cranberry, grape and apple juices Sports drinks like Gatorade Lightly seasoned clear broth or consume(fat free) Sugar, honey syrup  Sample Menu Breakfast                                Lunch  Supper Cranberry juice                    Beef broth                            Chicken broth Jell-O                                     Grape juice                           Apple juice Coffee or tea                        Jell-O                                      Popsicle                                                Coffee or tea                        Coffee or tea  _____________________________________________________________________     BRUSH YOUR TEETH MORNING OF SURGERY AND RINSE YOUR MOUTH OUT, NO CHEWING GUM CANDY OR MINTS.     Take these medicines the morning of surgery with A SIP OF WATER: Alparzolam (Xanax), Pravastatin (Pravachol), Lyrica, use Dulera inhaler and use Proair inhaler if needed and bring inhalers with you to  [facility] [place] may not have any metal on your body including hair pins and              piercings  Do not wear jewelry, make-up, lotions, powders or perfumes, deodorant             Do not wear nail polish on your fingernails.  Do not shave  48 hours prior to surgery.              Do not bring valuables to the hospital. [place].  Contacts, dentures or bridgework may not be worn into surgery.  Leave suitcase in the car. After surgery it may be brought to your room.                    Please read over the following fact sheets you were given: _____________________________________________________________________             [facility] - Preparing for Surgery Before surgery, you can play an important role.  Because skin is not sterile, your skin needs to be as free of germs as possible.  You can reduce the number of germs on your skin by washing with CHG (chlorahexidine gluconate) soap before surgery.  CHG is an antiseptic cleaner which kills germs and bonds with the skin to continue killing  germs even after washing. Please DO NOT use if you  have an allergy to CHG or antibacterial soaps.  If your skin becomes reddened/irritated stop using the CHG and inform your nurse when you arrive at Short Stay. Do not shave (including legs and underarms) for at least 48 hours prior to the first CHG shower.  You may shave your face/neck. Please follow these instructions carefully:  1.  Shower with CHG Soap the night before surgery and the  morning of Surgery.  2.  If you choose to wash your hair, wash your hair first as usual with your  normal  shampoo.  3.  After you shampoo, rinse your hair and body thoroughly to remove the  shampoo.                           4.  Use CHG as you would any other liquid soap.  You can apply chg directly  to the skin and wash                       Gently with a scrungie or clean washcloth.  5.  Apply the CHG Soap to your body ONLY FROM THE NECK DOWN.   Do not use on face/ open                           Wound or open sores. Avoid contact with eyes, ears mouth and genitals (private parts).                       Wash face,  Genitals (private parts) with your normal soap.             6.  Wash thoroughly, paying special attention to the area where your surgery  will be performed.  7.  Thoroughly rinse your body with warm water from the neck down.  8.  DO NOT shower/wash with your normal soap after using and rinsing off  the CHG Soap.                9.  Pat yourself dry with a clean towel.            10.  Wear clean pajamas.            11.  Place clean sheets on your bed the night of your first shower and do not  sleep with pets. Day of Surgery : Do not apply any lotions/deodorants the morning of surgery.  Please wear clean clothes to the hospital/surgery center.  FAILURE TO FOLLOW THESE INSTRUCTIONS MAY RESULT IN THE CANCELLATION OF YOUR SURGERY PATIENT SIGNATURE_________________________________  NURSE  SIGNATURE__________________________________  ________________________________________________________________________   Adam Phenix  An incentive spirometer is a tool that can help keep your lungs clear and active. This tool measures how well you are filling your lungs with each breath. Taking long deep breaths may help reverse or decrease the chance of developing breathing (pulmonary) problems (especially infection) following:  A long period of time when you are unable to move or be active. BEFORE THE PROCEDURE   If the spirometer includes an indicator to show your best effort, your nurse or respiratory therapist will set it to a desired goal.  If possible, sit up straight or lean slightly forward. Try not to slouch.  Hold the incentive spirometer in an upright position. INSTRUCTIONS FOR USE  1. Sit on  the edge of your bed if possible, or sit up as far as you can in bed or on a chair. 2. Hold the incentive spirometer in an upright position. 3. Breathe out normally. 4. Place the mouthpiece in your mouth and seal your lips tightly around it. 5. Breathe in slowly and as deeply as possible, raising the piston or the ball toward the top of the column. 6. Hold your breath for 3-5 seconds or for as long as possible. Allow the piston or ball to fall to the bottom of the column. 7. Remove the mouthpiece from your mouth and breathe out normally. 8. Rest for a few seconds and repeat Steps 1 through 7 at least 10 times every 1-2 hours when you are awake. Take your time and take a few normal breaths between deep breaths. 9. The spirometer may include an indicator to show your best effort. Use the indicator as a goal to work toward during each repetition. 10. After each set of 10 deep breaths, practice coughing to be sure your lungs are clear. If you have an incision (the cut made at the time of surgery), support your incision when coughing by placing a pillow or rolled up towels firmly  against it. Once you are able to get out of bed, walk around indoors and cough well. You may stop using the incentive spirometer when instructed by your caregiver.  RISKS AND COMPLICATIONS  Take your time so you do not get dizzy or light-headed.  If you are in pain, you may need to take or ask for pain medication before doing incentive spirometry. It is harder to take a deep breath if you are having pain. AFTER USE  Rest and breathe slowly and easily.  It can be helpful to keep track of a log of your progress. Your caregiver can provide you with a simple table to help with this. If you are using the spirometer at home, follow these instructions: [place] IF:   You are having difficultly using the spirometer.  You have trouble using the spirometer as often as instructed.  Your pain medication is not giving enough relief while using the spirometer.  You develop fever of 100.5 F (38.1 C) or higher. SEEK IMMEDIATE MEDICAL CARE IF:   You cough up bloody sputum that had not been present before.  You develop fever of 102 F (38.9 C) or greater.  You develop worsening pain at or near the incision site. MAKE SURE YOU:   Understand these instructions.  Will watch your condition.  Will get help right away if you are not doing well or get worse. Document Released: 12/28/2006 Document Revised: 11/09/2011 Document Reviewed: 02/28/2007 ExitCare Patient Information 2014 ExitCare, [place].   ________________________________________________________________________  WHAT IS A BLOOD TRANSFUSION? Blood Transfusion Information  A transfusion is the replacement of blood or some of its parts. Blood is made up of multiple cells which provide different functions.  Red blood cells carry oxygen and are used for blood loss replacement.  White blood cells fight against infection.  Platelets control bleeding.  Plasma helps clot blood.  Other blood products are available for  specialized needs, such as hemophilia or other clotting disorders. BEFORE THE TRANSFUSION  Who gives blood for transfusions?   Healthy volunteers who are fully evaluated to make sure their blood is safe. This is blood bank blood. Transfusion therapy is the safest it has ever been in the practice of medicine. Before blood is taken from a donor, a complete  history is taken to make sure that person has no history of diseases nor engages in risky social behavior (examples are intravenous drug use or sexual activity with multiple partners). The donor's travel history is screened to minimize risk of transmitting infections, such as malaria. The donated blood is tested for signs of infectious diseases, such as HIV and hepatitis. The blood is then tested to be sure it is compatible with you in order to minimize the chance of a transfusion reaction. If you or a relative donates blood, this is often done in anticipation of surgery and is not appropriate for emergency situations. It takes many days to process the donated blood. RISKS AND COMPLICATIONS Although transfusion therapy is very safe and saves many lives, the main dangers of transfusion include:   Getting an infectious disease.  Developing a transfusion reaction. This is an allergic reaction to something in the blood you were given. Every precaution is taken to prevent this. The decision to have a blood transfusion has been considered carefully by your caregiver before blood is given. Blood is not given unless the benefits outweigh the risks. AFTER THE TRANSFUSION  Right after receiving a blood transfusion, you will usually feel much better and more energetic. This is especially true if your red blood cells have gotten low (anemic). The transfusion raises the level of the red blood cells which carry oxygen, and this usually causes an energy increase.  The nurse administering the transfusion will monitor you carefully for complications. HOME CARE  INSTRUCTIONS  No special instructions are needed after a transfusion. You may find your energy is better. Speak with your caregiver about any limitations on activity for underlying diseases you may have. SEEK MEDICAL CARE IF:   Your condition is not improving after your transfusion.  You develop redness or irritation at the intravenous (IV) site. SEEK IMMEDIATE MEDICAL CARE IF:  Any of the following symptoms occur over the next 12 hours:  Shaking chills.  You have a temperature by mouth above 102 F (38.9 C), not controlled by medicine.  Chest, back, or muscle pain.  People around you feel you are not acting correctly or are confused.  Shortness of breath or difficulty breathing.  Dizziness and fainting.  You get a rash or develop hives.  You have a decrease in urine output.  Your urine turns a dark color or changes to pink, red, or brown. Any of the following symptoms occur over the next 10 days:  You have a temperature by mouth above 102 F (38.9 C), not controlled by medicine.  Shortness of breath.  Weakness after normal activity.  The white part of the eye turns yellow (jaundice).  You have a decrease in the amount of urine or are urinating less often.  Your urine turns a dark color or changes to pink, red, or brown. Document Released: 08/14/2000 Document Revised: 11/09/2011 Document Reviewed: 04/02/2008 [facility] Patient Information 2014 [place], [place].  _______________________________________________________________________

## 2019-10-11 ENCOUNTER — Encounter (INDEPENDENT_AMBULATORY_CARE_PROVIDER_SITE_OTHER): Payer: Self-pay

## 2019-10-11 ENCOUNTER — Other Ambulatory Visit: Payer: Self-pay

## 2019-10-11 ENCOUNTER — Encounter (HOSPITAL_COMMUNITY)
Admission: RE | Admit: 2019-10-11 | Discharge: 2019-10-11 | Disposition: A | Discharge status: Home / Self Care | Payer: Medicaid Other | Source: Ambulatory Visit | Attending: General Surgery | Admitting: General Surgery

## 2019-10-11 ENCOUNTER — Encounter (HOSPITAL_COMMUNITY): Payer: Self-pay

## 2019-10-11 DIAGNOSIS — Z01812 Encounter for preprocedural laboratory examination: Secondary | ICD-10-CM | POA: Insufficient documentation

## 2019-10-11 HISTORY — DX: Other specified behavioral and emotional disorders with onset usually occurring in childhood and adolescence: F98.8

## 2019-10-11 LAB — COMPREHENSIVE METABOLIC PANEL
ALT: 20 U/L (ref 0–44)
AST: 23 U/L (ref 15–41)
Albumin: 3.7 g/dL (ref 3.5–5.0)
Alkaline Phosphatase: 78 U/L (ref 38–126)
Anion gap: 6 (ref 5–15)
BUN: 15 mg/dL (ref 6–20)
CO2: 29 mmol/L (ref 22–32)
Calcium: 9.1 mg/dL (ref 8.9–10.3)
Chloride: 102 mmol/L (ref 98–111)
Creatinine, Ser: 0.91 mg/dL (ref 0.44–1.00)
GFR calc Af Amer: >60 mL/min (ref >60)
GFR calc non Af Amer: >60 mL/min (ref >60)
Glucose, Bld: 89 mg/dL (ref 70–99)
Potassium: 4.2 mmol/L (ref 3.5–5.1)
Sodium: 137 mmol/L (ref 135–145)
Total Bilirubin: 0.5 mg/dL (ref 0.3–1.2)
Total Protein: 6.8 g/dL (ref 6.5–8.1)

## 2019-10-11 LAB — HEMOGLOBIN A1C
Hgb A1c MFr Bld: 6.3 % — ABNORMAL HIGH (ref 4.8–5.6)
Mean Plasma Glucose: 134.11 mg/dL

## 2019-10-11 LAB — SURGICAL PCR SCREEN
MRSA, PCR: NEGATIVE
Staphylococcus aureus: NEGATIVE

## 2019-10-11 LAB — CBC
HCT: 37.1 % (ref 36.0–46.0)
Hemoglobin: 12 g/dL (ref 12.0–15.0)
MCH: 30.8 pg (ref 26.0–34.0)
MCHC: 32.3 g/dL (ref 30.0–36.0)
MCV: 95.1 fL (ref 80.0–100.0)
Platelets: 295 10*3/uL (ref 150–400)
RBC: 3.9 MIL/uL (ref 3.87–5.11)
RDW: 15.1 % (ref 11.5–15.5)
WBC: 10.1 10*3/uL (ref 4.0–10.5)
nRBC: 0 % (ref 0.0–0.2)

## 2019-10-11 LAB — ABO/RH: ABO/RH(D): O POS

## 2019-10-11 NOTE — Progress Notes (Signed)
LM on VM for patient to return call to go over medical history and go  over instructions for surgery.

## 2019-10-11 NOTE — Progress Notes (Addendum)
PCP - Simona Huh, NP Cardiologist - n/a  Chest x-ray - 05/28/2019 epic Chest CT-05/28/2019 epic EKG - 05/28/2019 epic Stress Test - n/a ECHO - n/a Cardiac Cath - n/a  Sleep Study - n/a CPAP - n/a  Fasting Blood Sugar - does not check  Checks Blood Sugar ___0__ times a day  Blood Thinner Instructions:n/a Aspirin Instructions:used BC powders 2 weeks ago Last Dose:n/a  Anesthesia review:   Patient has a history of ADD, COPD/emphysema, fibromyalgia, Hx MRSA, Hepatitis C (patient states some doctors say she has it and others say she has antibodies).  Patient denies shortness of breath, fever, cough and chest pain at PAT appointment   Patient verbalized understanding of instructions that were given to them at the PAT appointment. Patient was also instructed that they will need to review over the PAT instructions again at home before surgery.

## 2019-10-11 NOTE — Progress Notes (Signed)
Patient showed up for appointment at 1005 am that was a phone interview. Patient states that she was informed that it was an appointment for her to come in for.Patient had a lab appointment at 1130 am. Patient was given bag with instructions and wash and carbohydrate drink and was informed that nurse would call at 230 pm today to go over medical history for Anesthesia.

## 2019-10-12 MED ORDER — BUPIVACAINE LIPOSOME 1.3 % IJ SUSP
20.0000 mL | INTRAMUSCULAR | Status: DC
  Filled 2019-10-12: qty 20

## 2019-10-13 ENCOUNTER — Encounter (HOSPITAL_COMMUNITY): Payer: Self-pay | Admitting: General Surgery

## 2019-10-13 ENCOUNTER — Inpatient Hospital Stay (HOSPITAL_COMMUNITY): Payer: Medicaid Other | Admitting: Physician Assistant

## 2019-10-13 ENCOUNTER — Inpatient Hospital Stay (HOSPITAL_COMMUNITY)
Admission: RE | Admit: 2019-10-13 | Discharge: 2019-10-28 | DRG: 330 | Disposition: A | Discharge status: Home / Self Care | Payer: Medicaid Other | Source: Other Acute Inpatient Hospital | Attending: General Surgery | Admitting: General Surgery

## 2019-10-13 ENCOUNTER — Encounter (HOSPITAL_COMMUNITY)
Admission: RE | Disposition: A | Discharge status: Home / Self Care | Payer: Self-pay | Source: Other Acute Inpatient Hospital | Attending: General Surgery

## 2019-10-13 ENCOUNTER — Inpatient Hospital Stay (HOSPITAL_COMMUNITY): Payer: Medicaid Other | Admitting: Registered Nurse

## 2019-10-13 DIAGNOSIS — E039 Hypothyroidism, unspecified: Secondary | ICD-10-CM | POA: Diagnosis present

## 2019-10-13 DIAGNOSIS — M797 Fibromyalgia: Secondary | ICD-10-CM | POA: Diagnosis present

## 2019-10-13 DIAGNOSIS — Z90710 Acquired absence of both cervix and uterus: Secondary | ICD-10-CM

## 2019-10-13 DIAGNOSIS — K219 Gastro-esophageal reflux disease without esophagitis: Secondary | ICD-10-CM | POA: Diagnosis present

## 2019-10-13 DIAGNOSIS — G8929 Other chronic pain: Secondary | ICD-10-CM | POA: Diagnosis present

## 2019-10-13 DIAGNOSIS — J449 Chronic obstructive pulmonary disease, unspecified: Secondary | ICD-10-CM | POA: Diagnosis present

## 2019-10-13 DIAGNOSIS — Z96653 Presence of artificial knee joint, bilateral: Secondary | ICD-10-CM | POA: Diagnosis present

## 2019-10-13 DIAGNOSIS — F319 Bipolar disorder, unspecified: Secondary | ICD-10-CM | POA: Diagnosis present

## 2019-10-13 DIAGNOSIS — Z87891 Personal history of nicotine dependence: Secondary | ICD-10-CM | POA: Diagnosis not present

## 2019-10-13 DIAGNOSIS — Z79891 Long term (current) use of opiate analgesic: Secondary | ICD-10-CM | POA: Diagnosis not present

## 2019-10-13 DIAGNOSIS — Z20822 Contact with and (suspected) exposure to covid-19: Secondary | ICD-10-CM | POA: Diagnosis present

## 2019-10-13 DIAGNOSIS — R109 Unspecified abdominal pain: Secondary | ICD-10-CM

## 2019-10-13 DIAGNOSIS — K623 Rectal prolapse: Principal | ICD-10-CM | POA: Diagnosis present

## 2019-10-13 DIAGNOSIS — K567 Ileus, unspecified: Secondary | ICD-10-CM | POA: Diagnosis not present

## 2019-10-13 DIAGNOSIS — Z4659 Encounter for fitting and adjustment of other gastrointestinal appliance and device: Secondary | ICD-10-CM

## 2019-10-13 DIAGNOSIS — R140 Abdominal distension (gaseous): Secondary | ICD-10-CM

## 2019-10-13 HISTORY — PX: XI ROBOT ASSISTED RECTOPEXY: SHX6788

## 2019-10-13 LAB — TYPE AND SCREEN
ABO/RH(D): O POS
Antibody Screen: NEGATIVE

## 2019-10-13 SURGERY — RECTOPEXY, ROBOT-ASSISTED
Anesthesia: General | Site: Abdomen

## 2019-10-13 MED ORDER — PRAVASTATIN SODIUM 20 MG PO TABS
20.0000 mg | ORAL_TABLET | Freq: Every day | ORAL | Status: DC
  Administered 2019-10-13 – 2019-10-18 (×6): 20 mg via ORAL
  Filled 2019-10-13 (×6): qty 1

## 2019-10-13 MED ORDER — LINACLOTIDE 72 MCG PO CAPS
72.0000 ug | ORAL_CAPSULE | Freq: Two times a day (BID) | ORAL | Status: DC
  Administered 2019-10-14 – 2019-10-19 (×10): 72 ug via ORAL
  Filled 2019-10-13 (×14): qty 1

## 2019-10-13 MED ORDER — ROCURONIUM BROMIDE 10 MG/ML (PF) SYRINGE
PREFILLED_SYRINGE | INTRAVENOUS | Status: AC
  Filled 2019-10-13: qty 10

## 2019-10-13 MED ORDER — ALUM & MAG HYDROXIDE-SIMETH 200-200-20 MG/5ML PO SUSP
30.0000 mL | Freq: Four times a day (QID) | ORAL | Status: DC | PRN
  Administered 2019-10-17 – 2019-10-27 (×6): 30 mL via ORAL
  Filled 2019-10-13 (×6): qty 30

## 2019-10-13 MED ORDER — FENTANYL CITRATE (PF) 250 MCG/5ML IJ SOLN
INTRAMUSCULAR | Status: DC | PRN
  Administered 2019-10-13: 100 ug via INTRAVENOUS

## 2019-10-13 MED ORDER — HYDROMORPHONE HCL 1 MG/ML IJ SOLN
0.2500 mg | INTRAMUSCULAR | Status: DC | PRN
  Administered 2019-10-13 (×2): 0.5 mg via INTRAVENOUS
  Administered 2019-10-13: 0.25 mg via INTRAVENOUS
  Administered 2019-10-13: 0.5 mg via INTRAVENOUS

## 2019-10-13 MED ORDER — KETAMINE HCL 10 MG/ML IJ SOLN
INTRAMUSCULAR | Status: AC
  Filled 2019-10-13: qty 1

## 2019-10-13 MED ORDER — HYDROMORPHONE HCL 1 MG/ML IJ SOLN
0.5000 mg | INTRAMUSCULAR | Status: DC | PRN
  Administered 2019-10-13 – 2019-10-14 (×2): 0.5 mg via INTRAVENOUS
  Filled 2019-10-13 (×2): qty 0.5

## 2019-10-13 MED ORDER — SUGAMMADEX SODIUM 200 MG/2ML IV SOLN
INTRAVENOUS | Status: DC | PRN
  Administered 2019-10-13: 200 mg via INTRAVENOUS

## 2019-10-13 MED ORDER — HEPARIN SODIUM (PORCINE) 5000 UNIT/ML IJ SOLN
5000.0000 [IU] | Freq: Once | INTRAMUSCULAR | Status: AC
  Administered 2019-10-13: 5000 [IU] via SUBCUTANEOUS
  Filled 2019-10-13: qty 1

## 2019-10-13 MED ORDER — HYDRALAZINE HCL 20 MG/ML IJ SOLN
5.0000 mg | Freq: Once | INTRAMUSCULAR | Status: AC
  Administered 2019-10-13: 5 mg via INTRAVENOUS

## 2019-10-13 MED ORDER — ALVIMOPAN 12 MG PO CAPS
12.0000 mg | ORAL_CAPSULE | ORAL | Status: AC
  Administered 2019-10-13: 12 mg via ORAL
  Filled 2019-10-13: qty 1

## 2019-10-13 MED ORDER — HYDROMORPHONE HCL 1 MG/ML IJ SOLN
0.2500 mg | INTRAMUSCULAR | Status: DC | PRN
  Administered 2019-10-13 (×4): 0.5 mg via INTRAVENOUS

## 2019-10-13 MED ORDER — PROPOFOL 10 MG/ML IV BOLUS
INTRAVENOUS | Status: DC | PRN
  Administered 2019-10-13: 100 mg via INTRAVENOUS

## 2019-10-13 MED ORDER — ALPRAZOLAM 1 MG PO TABS
1.0000 mg | ORAL_TABLET | Freq: Four times a day (QID) | ORAL | Status: DC
  Administered 2019-10-13 – 2019-10-19 (×24): 1 mg via ORAL
  Filled 2019-10-13 (×25): qty 1

## 2019-10-13 MED ORDER — LIDOCAINE HCL 2 % IJ SOLN
INTRAMUSCULAR | Status: AC
  Filled 2019-10-13: qty 20

## 2019-10-13 MED ORDER — CLINDAMYCIN PHOSPHATE 900 MG/50ML IV SOLN
900.0000 mg | Freq: Once | INTRAVENOUS | Status: DC
  Filled 2019-10-13: qty 50

## 2019-10-13 MED ORDER — DEXAMETHASONE SODIUM PHOSPHATE 10 MG/ML IJ SOLN
INTRAMUSCULAR | Status: AC
  Filled 2019-10-13: qty 1

## 2019-10-13 MED ORDER — PHENYLEPHRINE 40 MCG/ML (10ML) SYRINGE FOR IV PUSH (FOR BLOOD PRESSURE SUPPORT)
PREFILLED_SYRINGE | INTRAVENOUS | Status: DC | PRN
  Administered 2019-10-13: 80 ug via INTRAVENOUS
  Administered 2019-10-13: 120 ug via INTRAVENOUS
  Administered 2019-10-13: 80 ug via INTRAVENOUS
  Administered 2019-10-13: 120 ug via INTRAVENOUS
  Administered 2019-10-13 (×3): 80 ug via INTRAVENOUS

## 2019-10-13 MED ORDER — SUVOREXANT 20 MG PO TABS
20.0000 mg | ORAL_TABLET | Freq: Every day | ORAL | Status: DC
  Filled 2019-10-13 (×4): qty 1

## 2019-10-13 MED ORDER — LACTATED RINGERS IV SOLN: INTRAVENOUS | Status: DC

## 2019-10-13 MED ORDER — SCOPOLAMINE 1 MG/3DAYS TD PT72
MEDICATED_PATCH | TRANSDERMAL | Status: DC | PRN
  Administered 2019-10-13: 1 via TRANSDERMAL

## 2019-10-13 MED ORDER — PHENYLEPHRINE HCL (PRESSORS) 10 MG/ML IV SOLN
INTRAVENOUS | Status: AC
  Filled 2019-10-13: qty 1

## 2019-10-13 MED ORDER — SACCHAROMYCES BOULARDII 250 MG PO CAPS
250.0000 mg | ORAL_CAPSULE | Freq: Two times a day (BID) | ORAL | Status: DC
  Administered 2019-10-13 – 2019-10-26 (×25): 250 mg via ORAL
  Filled 2019-10-13 (×26): qty 1

## 2019-10-13 MED ORDER — BUPIVACAINE LIPOSOME 1.3 % IJ SUSP
INTRAMUSCULAR | Status: DC | PRN
  Administered 2019-10-13: 20 mL

## 2019-10-13 MED ORDER — PHENYLEPHRINE HCL-NACL 10-0.9 MG/250ML-% IV SOLN
INTRAVENOUS | Status: DC | PRN
  Administered 2019-10-13: 40 ug/min via INTRAVENOUS

## 2019-10-13 MED ORDER — PNEUMOCOCCAL VAC POLYVALENT 25 MCG/0.5ML IJ INJ
0.5000 mL | INJECTION | INTRAMUSCULAR | Status: DC
  Filled 2019-10-13: qty 0.5

## 2019-10-13 MED ORDER — MIDAZOLAM HCL 2 MG/2ML IJ SOLN
INTRAMUSCULAR | Status: AC
  Filled 2019-10-13: qty 2

## 2019-10-13 MED ORDER — ENOXAPARIN SODIUM 40 MG/0.4ML ~~LOC~~ SOLN
40.0000 mg | SUBCUTANEOUS | Status: DC
  Administered 2019-10-14 – 2019-10-28 (×14): 40 mg via SUBCUTANEOUS
  Filled 2019-10-13 (×15): qty 0.4

## 2019-10-13 MED ORDER — LIDOCAINE 2% (20 MG/ML) 5 ML SYRINGE
INTRAMUSCULAR | Status: AC
  Filled 2019-10-13: qty 5

## 2019-10-13 MED ORDER — MIDAZOLAM HCL 5 MG/5ML IJ SOLN
INTRAMUSCULAR | Status: DC | PRN
  Administered 2019-10-13: 2 mg via INTRAVENOUS

## 2019-10-13 MED ORDER — LIDOCAINE 2% (20 MG/ML) 5 ML SYRINGE
INTRAMUSCULAR | Status: DC | PRN
  Administered 2019-10-13: 40 mg via INTRAVENOUS

## 2019-10-13 MED ORDER — KCL IN DEXTROSE-NACL 20-5-0.45 MEQ/L-%-% IV SOLN
INTRAVENOUS | Status: DC
  Filled 2019-10-13: qty 1000

## 2019-10-13 MED ORDER — PROPOFOL 10 MG/ML IV BOLUS
INTRAVENOUS | Status: AC
  Filled 2019-10-13: qty 20

## 2019-10-13 MED ORDER — ROCURONIUM BROMIDE 10 MG/ML (PF) SYRINGE
PREFILLED_SYRINGE | INTRAVENOUS | Status: DC | PRN
  Administered 2019-10-13 (×2): 40 mg via INTRAVENOUS
  Administered 2019-10-13: 20 mg via INTRAVENOUS

## 2019-10-13 MED ORDER — DIPHENHYDRAMINE HCL 50 MG/ML IJ SOLN: 12.5000 mg | Freq: Four times a day (QID) | INTRAMUSCULAR | Status: DC | PRN

## 2019-10-13 MED ORDER — KETAMINE HCL 10 MG/ML IJ SOLN
INTRAMUSCULAR | Status: DC | PRN
  Administered 2019-10-13: 20 mg via INTRAVENOUS

## 2019-10-13 MED ORDER — DIAZEPAM 5 MG PO TABS
10.0000 mg | ORAL_TABLET | Freq: Three times a day (TID) | ORAL | Status: DC | PRN
  Administered 2019-10-14 – 2019-10-19 (×5): 10 mg via ORAL
  Filled 2019-10-13 (×7): qty 2

## 2019-10-13 MED ORDER — FENTANYL CITRATE (PF) 250 MCG/5ML IJ SOLN
INTRAMUSCULAR | Status: AC
  Filled 2019-10-13: qty 5

## 2019-10-13 MED ORDER — GENTAMICIN SULFATE 40 MG/ML IJ SOLN
5.0000 mg/kg | Freq: Once | INTRAVENOUS | Status: DC
  Filled 2019-10-13: qty 6.5

## 2019-10-13 MED ORDER — DEXAMETHASONE SODIUM PHOSPHATE 10 MG/ML IJ SOLN
INTRAMUSCULAR | Status: DC | PRN
  Administered 2019-10-13: 8 mg via INTRAVENOUS

## 2019-10-13 MED ORDER — QUETIAPINE FUMARATE 200 MG PO TABS
800.0000 mg | ORAL_TABLET | Freq: Every day | ORAL | Status: DC
  Administered 2019-10-13 – 2019-10-19 (×7): 800 mg via ORAL
  Filled 2019-10-13 (×3): qty 4
  Filled 2019-10-13 (×2): qty 1
  Filled 2019-10-13: qty 8
  Filled 2019-10-13: qty 4
  Filled 2019-10-13: qty 8
  Filled 2019-10-13: qty 1

## 2019-10-13 MED ORDER — LACTATED RINGERS IR SOLN
Status: DC | PRN
  Administered 2019-10-13: 1000 mL

## 2019-10-13 MED ORDER — ALBUMIN HUMAN 5 % IV SOLN
INTRAVENOUS | Status: AC
  Filled 2019-10-13: qty 250

## 2019-10-13 MED ORDER — MOMETASONE FURO-FORMOTEROL FUM 200-5 MCG/ACT IN AERO
2.0000 | INHALATION_SPRAY | Freq: Two times a day (BID) | RESPIRATORY_TRACT | Status: DC | PRN
  Administered 2019-10-20: 2 via RESPIRATORY_TRACT
  Filled 2019-10-13: qty 8.8

## 2019-10-13 MED ORDER — OXYCODONE HCL 5 MG PO TABS
10.0000 mg | ORAL_TABLET | Freq: Four times a day (QID) | ORAL | Status: DC | PRN
  Administered 2019-10-14 – 2019-10-19 (×12): 10 mg via ORAL
  Filled 2019-10-13 (×14): qty 2

## 2019-10-13 MED ORDER — ONDANSETRON HCL 4 MG PO TABS
4.0000 mg | ORAL_TABLET | Freq: Four times a day (QID) | ORAL | Status: DC | PRN
  Administered 2019-10-13 – 2019-10-18 (×3): 4 mg via ORAL
  Filled 2019-10-13 (×3): qty 1

## 2019-10-13 MED ORDER — PREGABALIN 50 MG PO CAPS
100.0000 mg | ORAL_CAPSULE | Freq: Three times a day (TID) | ORAL | Status: DC
  Administered 2019-10-13 – 2019-10-19 (×17): 100 mg via ORAL
  Filled 2019-10-13 (×4): qty 2
  Filled 2019-10-13: qty 1
  Filled 2019-10-13 (×2): qty 2
  Filled 2019-10-13 (×5): qty 1
  Filled 2019-10-13 (×3): qty 2
  Filled 2019-10-13: qty 1
  Filled 2019-10-13: qty 2
  Filled 2019-10-13: qty 1
  Filled 2019-10-13: qty 2

## 2019-10-13 MED ORDER — ONDANSETRON HCL 4 MG/2ML IJ SOLN
INTRAMUSCULAR | Status: AC
  Filled 2019-10-13: qty 2

## 2019-10-13 MED ORDER — SCOPOLAMINE 1 MG/3DAYS TD PT72
MEDICATED_PATCH | TRANSDERMAL | Status: AC
  Filled 2019-10-13: qty 1

## 2019-10-13 MED ORDER — ENSURE SURGERY PO LIQD
237.0000 mL | Freq: Two times a day (BID) | ORAL | Status: DC
  Administered 2019-10-14 – 2019-10-16 (×4): 237 mL via ORAL
  Filled 2019-10-13 (×14): qty 237

## 2019-10-13 MED ORDER — LACTATED RINGERS IV SOLN: INTRAVENOUS | Status: DC | PRN

## 2019-10-13 MED ORDER — HYDROMORPHONE HCL 1 MG/ML IJ SOLN
INTRAMUSCULAR | Status: AC
  Filled 2019-10-13: qty 1

## 2019-10-13 MED ORDER — AMPHETAMINE-DEXTROAMPHETAMINE 10 MG PO TABS
20.0000 mg | ORAL_TABLET | Freq: Two times a day (BID) | ORAL | Status: DC
  Administered 2019-10-13 – 2019-10-19 (×11): 20 mg via ORAL
  Filled 2019-10-13: qty 1
  Filled 2019-10-13: qty 2
  Filled 2019-10-13: qty 1
  Filled 2019-10-13 (×5): qty 2
  Filled 2019-10-13: qty 1
  Filled 2019-10-13: qty 2
  Filled 2019-10-13 (×3): qty 1
  Filled 2019-10-13: qty 2

## 2019-10-13 MED ORDER — BUPIVACAINE HCL (PF) 0.25 % IJ SOLN
INTRAMUSCULAR | Status: DC | PRN
  Administered 2019-10-13: 30 mL

## 2019-10-13 MED ORDER — 0.9 % SODIUM CHLORIDE (POUR BTL) OPTIME
TOPICAL | Status: DC | PRN
  Administered 2019-10-13: 2000 mL

## 2019-10-13 MED ORDER — ONDANSETRON HCL 4 MG/2ML IJ SOLN
INTRAMUSCULAR | Status: DC | PRN
  Administered 2019-10-13: 4 mg via INTRAVENOUS

## 2019-10-13 MED ORDER — DIPHENHYDRAMINE HCL 12.5 MG/5ML PO ELIX: 12.5000 mg | ORAL_SOLUTION | Freq: Four times a day (QID) | ORAL | Status: DC | PRN

## 2019-10-13 MED ORDER — ALBUTEROL SULFATE (2.5 MG/3ML) 0.083% IN NEBU: 3.0000 mL | INHALATION_SOLUTION | Freq: Four times a day (QID) | RESPIRATORY_TRACT | Status: DC | PRN

## 2019-10-13 MED ORDER — IPRATROPIUM-ALBUTEROL 0.5-2.5 (3) MG/3ML IN SOLN
3.0000 mL | Freq: Once | RESPIRATORY_TRACT | Status: AC
  Administered 2019-10-13: 18:00:00 3 mL via RESPIRATORY_TRACT
  Filled 2019-10-13 (×2): qty 3

## 2019-10-13 MED ORDER — DULOXETINE HCL 30 MG PO CPEP
60.0000 mg | ORAL_CAPSULE | Freq: Every day | ORAL | Status: DC
  Administered 2019-10-14 – 2019-10-19 (×6): 60 mg via ORAL
  Filled 2019-10-13: qty 2
  Filled 2019-10-13: qty 1
  Filled 2019-10-13 (×2): qty 2
  Filled 2019-10-13: qty 1
  Filled 2019-10-13: qty 2

## 2019-10-13 MED ORDER — HYDRALAZINE HCL 20 MG/ML IJ SOLN
INTRAMUSCULAR | Status: AC
  Filled 2019-10-13: qty 1

## 2019-10-13 MED ORDER — CLINDAMYCIN PHOSPHATE 900 MG/50ML IV SOLN
900.0000 mg | Freq: Three times a day (TID) | INTRAVENOUS | Status: AC
  Administered 2019-10-13: 900 mg via INTRAVENOUS
  Filled 2019-10-13: qty 50

## 2019-10-13 MED ORDER — SODIUM CHLORIDE 0.9 % IV SOLN
INTRAVENOUS | Status: DC | PRN
  Administered 2019-10-13: 3 ug/kg/min via INTRAVENOUS

## 2019-10-13 MED ORDER — PHENYLEPHRINE 40 MCG/ML (10ML) SYRINGE FOR IV PUSH (FOR BLOOD PRESSURE SUPPORT)
PREFILLED_SYRINGE | INTRAVENOUS | Status: AC
  Filled 2019-10-13: qty 20

## 2019-10-13 MED ORDER — ALBUMIN HUMAN 5 % IV SOLN: INTRAVENOUS | Status: DC | PRN

## 2019-10-13 MED ORDER — ONDANSETRON HCL 4 MG/2ML IJ SOLN
4.0000 mg | Freq: Four times a day (QID) | INTRAMUSCULAR | Status: DC | PRN
  Administered 2019-10-16 – 2019-10-25 (×11): 4 mg via INTRAVENOUS
  Filled 2019-10-13 (×12): qty 2

## 2019-10-13 MED ORDER — LIDOCAINE 20MG/ML (2%) 15 ML SYRINGE OPTIME
INTRAMUSCULAR | Status: DC | PRN
  Administered 2019-10-13: 1.5 mg/kg/h via INTRAVENOUS

## 2019-10-13 MED ORDER — ALVIMOPAN 12 MG PO CAPS
12.0000 mg | ORAL_CAPSULE | Freq: Two times a day (BID) | ORAL | Status: DC
  Administered 2019-10-14 – 2019-10-19 (×10): 12 mg via ORAL
  Filled 2019-10-13 (×12): qty 1

## 2019-10-13 MED ORDER — HYDROMORPHONE HCL 1 MG/ML IJ SOLN
INTRAMUSCULAR | Status: AC
  Filled 2019-10-13: qty 2

## 2019-10-13 MED ORDER — BUPIVACAINE HCL 0.25 % IJ SOLN
INTRAMUSCULAR | Status: AC
  Filled 2019-10-13: qty 1

## 2019-10-13 SURGICAL SUPPLY — 75 items
ADH SKN CLS APL DERMABOND .7 (GAUZE/BANDAGES/DRESSINGS) ×1
BLADE SURG SZ11 CARB STEEL (BLADE) ×3 IMPLANT
COVER SURGICAL LIGHT HANDLE (MISCELLANEOUS) ×3 IMPLANT
COVER TIP SHEARS 8 DVNC (MISCELLANEOUS) ×1 IMPLANT
COVER TIP SHEARS 8MM DA VINCI (MISCELLANEOUS) ×2
COVER WAND RF STERILE (DRAPES) ×3 IMPLANT
DECANTER SPIKE VIAL GLASS SM (MISCELLANEOUS) ×3 IMPLANT
DERMABOND ADVANCED (GAUZE/BANDAGES/DRESSINGS) ×2
DERMABOND ADVANCED .7 DNX12 (GAUZE/BANDAGES/DRESSINGS) ×1 IMPLANT
DRAIN CHANNEL 19F RND (DRAIN) ×2 IMPLANT
DRAPE ARM DVNC X/XI (DISPOSABLE) ×4 IMPLANT
DRAPE CARDIOVASC SPLIT 84X147 (DRAPES) ×2 IMPLANT
DRAPE CARDIOVASC SPLIT 88X140 (DRAPES) ×2 IMPLANT
DRAPE COLUMN DVNC XI (DISPOSABLE) ×1 IMPLANT
DRAPE DA VINCI XI ARM (DISPOSABLE) ×8
DRAPE DA VINCI XI COLUMN (DISPOSABLE) ×2
DRAPE SHEET LG 3/4 BI-LAMINATE (DRAPES) ×3 IMPLANT
DRAPE SURG IRRIG POUCH 19X23 (DRAPES) ×3 IMPLANT
DRAPE UTILITY XL STRL (DRAPES) ×6 IMPLANT
DRSG OPSITE POSTOP 4X6 (GAUZE/BANDAGES/DRESSINGS) ×2 IMPLANT
ELECT PENCIL ROCKER SW 15FT (MISCELLANEOUS) ×3 IMPLANT
ELECT REM PT RETURN 15FT ADLT (MISCELLANEOUS) ×3 IMPLANT
EVACUATOR SILICONE 100CC (DRAIN) ×2 IMPLANT
HOLDER FOLEY CATH W/STRAP (MISCELLANEOUS) ×3 IMPLANT
KIT PROCEDURE DA VINCI SI (MISCELLANEOUS)
KIT PROCEDURE DVNC SI (MISCELLANEOUS) IMPLANT
KIT SIGMOIDOSCOPE (SET/KITS/TRAYS/PACK) ×3 IMPLANT
KIT TURNOVER KIT A (KITS) ×3 IMPLANT
LEGGING LITHOTOMY PAIR STRL (DRAPES) ×3 IMPLANT
NDL INSUFFLATION 14GA 120MM (NEEDLE) ×1 IMPLANT
NEEDLE HYPO 22GX1.5 SAFETY (NEEDLE) ×3 IMPLANT
NEEDLE INSUFFLATION 14GA 120MM (NEEDLE) ×3 IMPLANT
PACK COLON (CUSTOM PROCEDURE TRAY) ×2 IMPLANT
PAD POSITIONING PINK XL (MISCELLANEOUS) ×3 IMPLANT
PORT LAP GEL ALEXIS MED 5-9CM (MISCELLANEOUS) ×2 IMPLANT
PROTECTOR NERVE ULNAR (MISCELLANEOUS) ×3 IMPLANT
RELOAD STAPLER 3.5X60 BLU DVNC (STAPLE) ×1 IMPLANT
SCISSORS LAP 5X45 EPIX DISP (ENDOMECHANICALS) ×3 IMPLANT
SEAL CANN UNIV 5-8 DVNC XI (MISCELLANEOUS) ×4 IMPLANT
SEAL XI 5MM-8MM UNIVERSAL (MISCELLANEOUS) ×8
SEALER VESSEL DA VINCI XI (MISCELLANEOUS) ×2
SEALER VESSEL EXT DVNC XI (MISCELLANEOUS) ×1 IMPLANT
SET IRRIG TUBING LAPAROSCOPIC (IRRIGATION / IRRIGATOR) ×3 IMPLANT
SLEEVE ADV FIXATION 5X100MM (TROCAR) IMPLANT
SOL ANTI FOG 6CC (MISCELLANEOUS) ×1 IMPLANT
SOLUTION ANTI FOG 6CC (MISCELLANEOUS) ×2
SOLUTION ELECTROLUBE (MISCELLANEOUS) ×3 IMPLANT
SPONGE LAP 18X18 RF (DISPOSABLE) ×3 IMPLANT
STAPLER 60 DA VINCI SURE FORM (STAPLE) ×2
STAPLER 60 SUREFORM DVNC (STAPLE) IMPLANT
STAPLER CANNULA SEAL DVNC XI (STAPLE) IMPLANT
STAPLER CANNULA SEAL XI (STAPLE) ×2
STAPLER ECHELON POWER CIR 31 (STAPLE) ×2 IMPLANT
STAPLER RELOAD 3.5X60 BLU DVNC (STAPLE) ×1
STAPLER RELOAD 3.5X60 BLUE (STAPLE) ×2
SUT ETHIBOND 0 36 GRN (SUTURE) ×4 IMPLANT
SUT ETHILON 2 0 PS N (SUTURE) ×2 IMPLANT
SUT NOVA NAB DX-16 0-1 5-0 T12 (SUTURE) ×6 IMPLANT
SUT PROLENE 2 0 KS (SUTURE) ×2 IMPLANT
SUT SILK 2 0 (SUTURE) ×3
SUT SILK 2-0 30XBRD TIE 12 (SUTURE) IMPLANT
SUT SILK 3 0 SH CR/8 (SUTURE) ×3 IMPLANT
SUT VIC AB 2-0 SH 27 (SUTURE) ×3
SUT VIC AB 2-0 SH 27X BRD (SUTURE) IMPLANT
SUT VIC AB 4-0 PS2 27 (SUTURE) ×3 IMPLANT
SUT VICRYL 4-0 PS2 18IN ABS (SUTURE) ×2 IMPLANT
SUT VLOC 180 2-0 9IN GS21 (SUTURE) ×3 IMPLANT
SYR 20ML LL LF (SYRINGE) ×3 IMPLANT
SYR CONTROL 10ML LL (SYRINGE) ×3 IMPLANT
TOWEL OR NON WOVEN STRL DISP B (DISPOSABLE) ×3 IMPLANT
TRAY FOLEY MTR SLVR 14FR STAT (SET/KITS/TRAYS/PACK) ×3 IMPLANT
TROCAR ADV FIXATION 5X100MM (TROCAR) ×3 IMPLANT
TUBING CONNECTING 10 (TUBING) ×3 IMPLANT
TUBING CONNECTING 10' (TUBING) ×2
TUBING INSUFFLATION 10FT LAP (TUBING) ×3 IMPLANT

## 2019-10-13 NOTE — Op Note (Signed)
10/13/2019  4:27 PM  PATIENT:  Jamie Schultz  59 y.o. female  Patient Care Team: Simona Huh, NP as PCP - General (Nurse Practitioner) Leeroy Cha, MD as Consulting Physician (Neurosurgery) [facility] Kerri Perches, PA-C (Physician Assistant)  PRE-OPERATIVE DIAGNOSIS:  RECTAL PROLAPSE  POST-OPERATIVE DIAGNOSIS:  RECTAL PROLAPSE  PROCEDURE:  XI ROBOT ASSISTED RECTOPEXY AND SIGMOIDECTOMY,   Surgeon(s): Leighton Ruff, MD Michael Boston, MD  ASSISTANT: Dr Johney [place]   ANESTHESIA:   local and general  EBL:100 ml  Total I/O In: 250 [IV Piggyback:250] Out: 450 [Urine:350; Blood:100]  Delay start of Pharmacological VTE agent (>24hrs) due to surgical blood loss or risk of bleeding:  no  DRAINS: (57F) Jackson-Pratt drain(s) with closed bulb suction in the pelvis   SPECIMEN:  Source of Specimen:  Sigmoid colon  DISPOSITION OF SPECIMEN:  PATHOLOGY  COUNTS:  YES  PLAN OF CARE: Admit to inpatient   PATIENT DISPOSITION:  PACU - hemodynamically stable.  INDICATION:    59 y.o. F with rectal prolapse and chronic constipation.  I recommended segmental resection:  The anatomy & physiology of the digestive tract was discussed.  The pathophysiology was discussed.  Natural history risks without surgery was discussed.   I worked to give an overview of the disease and the frequent need to have multispecialty involvement.  I feel the risks of no intervention will lead to serious problems that outweigh the operative risks; therefore, I recommended a partial colectomy to remove the pathology.  Laparoscopic & open techniques were discussed.   Risks such as bleeding, infection, abscess, leak, reoperation, possible ostomy, hernia, heart attack, death, and other risks were discussed.  I noted a good likelihood this will help address the problem.   Goals of post-operative recovery were discussed as well.    The patient expressed understanding & wished to proceed with  surgery.  OR FINDINGS:   Patient had a redundant sigmoid colon   The anastomosis rests 10 cm from the anal verge by rigid proctoscopy.  DESCRIPTION:   Informed consent was confirmed.  The patient underwent general anaesthesia without difficulty.  The patient was positioned appropriately.  VTE prevention in place.  The patient's abdomen was clipped, prepped, & draped in a sterile fashion.  Surgical timeout confirmed our plan.  The patient was positioned in reverse Trendelenburg.  Abdominal entry was gained using a Varies needle in the LUQ.  Entry was clean.  I induced carbon dioxide insufflation.  An 58mm robotic port was placed in the RUQ.  Camera inspection revealed no injury.  Extra ports were carefully placed under direct laparoscopic visualization.  I laparoscopically reflected the greater omentum and the upper abdomen the small bowel in the upper abdomen. The patient was appropriately positioned and the robot was docked to the patient's left side.  Instruments were placed under direct visualization.    I mobilized the sigmoid colon off of the pelvic sidewall.  I scored the base of peritoneum of the right side of the mesentery of the left colon from the ligament of Treitz to the peritoneal reflection of the mid rectum.  I elevated the sigmoid mesentery and enetered into the retro-mesenteric plane. We were able to identify the left ureter and gonadal vessels. We kept those posterior within the retroperitoneum and elevated the left colon mesentery off that.  I continued distally and got into the avascular plane posterior to the mesorectum. This allowed me to help mobilize the rectum as well by freeing the mesorectum off the sacrum.  I mobilized the peritoneal coverings towards the peritoneal reflection on both the right and left sides of the rectum.  I could see the right and left ureters and stayed away from them.  I continued down the posterior mesorectal plane to the level of the coccyx.  I then  mobilized laterally using blunt dissection on either side.  I then took the remaining peritoneal reflections anteriorly to allow for mobilization of the proximal rectum to the level of the sacrum.  I continue to free the mesorectal plane from the pelvic floor using blunt dissection and the robotic vessel sealer.  Once I felt that this was completely retracted, I evaluated the patient with a digital rectal exam.  There was no further prolapse noted.    After confirming the left ureter was out of the way, I went ahead and ligated the inferior mesenteric artery pedicle with bipolar robotic vessel sealer well above its takeoff from the aorta.  We ensured hemostasis. I skeletonized the mesorectum at the junction at the proximal rectum using blunt dissection & bipolar robotic vessel sealer.  I left a portion of peritoneal coverings to allow for fixation to the sacrum.  I then divided the rectum with a blue load 60 mm robotic stapler.  I also divided the mesentery of the proximal portion of the descending sigmoid junction using the robotic vessel sealer.  The sigmoid was then retracted to the left side of the abdomen.  The rectal stump was brought to the level of the sacrum.  The anterior peritoneal reflection was sutured to the middle of the sacral promontory first using a V-Loc suture for fixation and then using three #0 Ethibond interrupted sutures for complete fixation.  The sutures were then removed.  The robot was then undocked.  A small Pfannenstiel incision was made.  An Pocahontas wound protector was placed.  The sigmoid colon was removed from the abdomen and transected over a pursestring device.  A 2-0 Prolene pursestring suture was placed.  This was secured with 3-0 silk sutures.  A 31 mm EEA anvil was then placed into the colon and the pursestring was tied tightly around this.  This was then placed back into the abdomen.  The robot was then redocked and Dr. Johney [place] placed the EEA stapler into the rectal stump.   This was brought out anterior to the staple line and anastomosis was created without difficulty.  There was no tension on the anastomosis.  There was no leak when tested with insufflation under irrigation.  I then placed 2 more fixation sutures using the 2-0 V lock suture for additional rectopexy.  A 19 [place] Blake drain was placed into the pelvis and brought out through the right lower quadrant incision.  This was secured into place with a 2-0 nylon suture.  The irrigation was removed from the abdomen.  We then switched to clean gowns, gloves, instruments and drapes.  The peritoneum of the Pfannenstiel incision was closed using a running 2-0 Vicryl suture.  The fascia was then closed using #1 interrupted Novafil sutures.  The subcutaneous tissue was reapproximated with interrupted 2-0 Vicryl sutures and the skin was closed using a running 4-0 Vicryl subcuticular suture.  A sterile dressing was applied.  The remaining port sites were closed using interrupted 4-0 Vicryl subcuticular sutures and Dermabond.  A drain sponge was placed.  The patient was then awakened from anesthesia and sent to the postanesthesia care unit in stable condition.  All counts were correct per operating room staff.  An MD assistant was necessary for tissue manipulation, retraction and positioning due to the complexity of the case and hospital policies

## 2019-10-13 NOTE — Transfer of Care (Signed)
Immediate Anesthesia Transfer of Care Note  Patient: TWAN MASLOW  Procedure(s) Performed: XI ROBOT ASSISTED SIGMOIDECTOMY AND RECTOPEXY, RIGID PROCTOSCOPY (N/A Abdomen)  Patient Location: PACU  Anesthesia Type:General  Level of Consciousness: sedated  Airway & Oxygen Therapy: Patient Spontanous Breathing and Patient connected to face mask oxygen  Post-op Assessment: Report given to RN and Post -op Vital signs reviewed and stable  Post vital signs: Reviewed and stable  Last Vitals:  Vitals Value Taken Time  BP 198/186 10/13/19 1643  Temp    Pulse 76 10/13/19 1644  Resp 14 10/13/19 1644  SpO2 94 % 10/13/19 1644  Vitals shown include unvalidated device data.  Last Pain:  Vitals:   10/13/19 1235  TempSrc:   PainSc: 8       Patients Stated Pain Goal: 7 ([id number])  Complications: No apparent anesthesia complications

## 2019-10-13 NOTE — Anesthesia Postprocedure Evaluation (Signed)
Anesthesia Post Note  Patient: Jamie Schultz  Procedure(s) Performed: XI ROBOT ASSISTED SIGMOIDECTOMY AND RECTOPEXY, RIGID PROCTOSCOPY (N/A Abdomen)     Patient location during evaluation: PACU Anesthesia Type: General Level of consciousness: awake and alert Pain management: pain level controlled Vital Signs Assessment: post-procedure vital signs reviewed and stable Respiratory status: spontaneous breathing, nonlabored ventilation, respiratory function stable and patient connected to nasal cannula oxygen Cardiovascular status: blood pressure returned to baseline and stable Postop Assessment: no apparent nausea or vomiting Anesthetic complications: no    Last Vitals:  Vitals:   10/13/19 1945 10/13/19 2010  BP: 105/67 120/79  Pulse: 90 87  Resp: 16 18  Temp: 36.6 C 37.3 C  SpO2: 92% 96%    Last Pain:  Vitals:   10/13/19 2010  TempSrc: Oral  PainSc:                  [name]

## 2019-10-13 NOTE — Anesthesia Procedure Notes (Signed)
Procedure Name: Intubation Date/Time: 10/13/2019 1:43 PM Performed by: Talbot Grumbling, CRNA Pre-anesthesia Checklist: Patient identified, Emergency Drugs available, Suction available and Patient being monitored Patient Re-evaluated:Patient Re-evaluated prior to induction Oxygen Delivery Method: Circle system utilized Preoxygenation: Pre-oxygenation with 100% oxygen Induction Type: IV induction Ventilation: Mask ventilation without difficulty Laryngoscope Size: Mac and 3 Grade View: Grade I Tube type: Oral Tube size: 7.0 mm Number of attempts: 1 Airway Equipment and Method: Stylet Placement Confirmation: ETT inserted through vocal cords under direct vision,  positive ETCO2 and breath sounds checked- equal and bilateral Secured at: 21 cm Tube secured with: Tape Dental Injury: Teeth and Oropharynx as per pre-operative assessment

## 2019-10-13 NOTE — H&P (Signed)
The patient is a 59 year old female who presents with rectal prolapse. 59 year old female with multiple site diagnosis and chronic pain disorder who presents to the office today with complaints of rectal prolapse. She states that this is been present for approximately 15 years. It has gotten worse over the past few years, causing significant pain and rectal bleeding. She takes chronic narcotics for multiple pain disorders. She reports a long-standing history of constipation and straining. She states that when she takes her Linzess, she is able to have a bowel movement without difficulty. Surgical history significant for hysterectomy and umbilical hernia repair.   Past Surgical History  Hemorrhoidectomy  Oral Surgery   Diagnostic Studies History  Colonoscopy  1-5 years ago Mammogram  within last year  Allergies  Keflex *CEPHALOSPORINS*  Anaphylaxis. Penicillins  Anaphylaxis. Sulfa Drugs  HYDROcodone Bitartrate ER *ANALGESICS - OPIOID*  Rash. NSAIDs  Iodinated Contrast Media  Codeine/Codeine Derivatives  Methadone *CHEMICALS*  fentaNYL *ANALGESICS - OPIOID*  Nausea, Vomiting. Neurontin *ANTICONVULSANTS*  Hives. traMADol HCl *ANALGESICS - OPIOID*  Hives, Rash. Toradol *ANALGESICS - ANTI-INFLAMMATORY*  Tylenol *ANALGESICS - NonNarcotic*  Cyclobenzaprine *CHEMICALS*  Adhesive Tape   Medication History  oxyCODONE HCl (10MG  Tablet, Oral) Active. Belsomra (20MG  Tablet, Oral) Active. LORazepam (1MG  Tablet, Oral) Active. Pregabalin (100MG  Capsule, Oral) Active. QUEtiapine Fumarate (400MG  Tablet, Oral) Active. Albuterol (90MCG/ACT Aerosol Soln, Inhalation) Active. Dulera (200-5MCG/ACT Aerosol, Inhalation) Active. Cymbalta (60MG  Capsule DR Part, Oral) Active. EpiPen 2-Pak (0.3MG /0.3ML Soln Auto-inj, Injection) Active. hydrOXYzine HCl (25MG  Tablet, Oral) Active. Medications Reconciled  Social History Mammie Lorenzo, LPN; 624THL [id number]  AM) Caffeine use  Carbonated beverages. No alcohol use  No drug use  Tobacco use  Former smoker.  Family History Mammie Lorenzo, LPN; 624THL QA348G AM) First Degree Relatives  No pertinent family history   Pregnancy / Birth History Mammie Lorenzo, LPN; 624THL QA348G AM) Age at menarche  28 years. Age of menopause  24-55 Gravida  1 Irregular periods  Maternal age  16-40 Para  1  Other Problems Mammie Lorenzo, LPN; 624THL QA348G AM) Anxiety Disorder  Back Pain  Gastroesophageal Reflux Disease     Review of Systems General Not Present- Appetite Loss, Chills, Fatigue, Fever, Night Sweats, Weight Gain and Weight Loss. Skin Not Present- Change in Wart/Mole, Dryness, Hives, Jaundice, New Lesions, Non-Healing Wounds, Rash and Ulcer. HEENT Not Present- Earache, Hearing Loss, Hoarseness, Nose Bleed, Oral Ulcers, Ringing in the Ears, Seasonal Allergies, Sinus Pain, Sore Throat, Visual Disturbances, Wears glasses/contact lenses and Yellow Eyes. Respiratory Not Present- Bloody sputum, Chronic Cough, Difficulty Breathing, Snoring and Wheezing. Breast Not Present- Breast Mass, Breast Pain, Nipple Discharge and Skin Changes. Cardiovascular Present- Leg Cramps. Not Present- Chest Pain, Difficulty Breathing Lying Down, Palpitations, Rapid Heart Rate, Shortness of Breath and Swelling of Extremities. Gastrointestinal Present- Abdominal Pain, Bloating, Bloody Stool, Constipation, Nausea and Rectal Pain. Not Present- Change in Bowel Habits, Chronic diarrhea, Difficulty Swallowing, Excessive gas, Gets full quickly at meals, Hemorrhoids, Indigestion and Vomiting. Female Genitourinary Present- Pelvic Pain and Urgency. Not Present- Frequency, Nocturia and Painful Urination. Musculoskeletal Present- Back Pain, Joint Pain, Joint Stiffness, Muscle Pain and Muscle Weakness. Not Present- Swelling of Extremities. Neurological Present- Decreased Memory, Fainting, Headaches, Numbness,  Tingling and Weakness. Not Present- Seizures, Tremor and Trouble walking. Psychiatric Present- Anxiety and Change in Sleep Pattern. Not Present- Bipolar, Depression, Fearful and Frequent crying. Endocrine Not Present- Cold Intolerance, Excessive Hunger, Hair Changes, Heat Intolerance, Hot flashes and New Diabetes. Hematology Not Present- Blood Thinners, Easy Bruising, Excessive bleeding, Gland  problems, HIV and Persistent Infections.  BP 124/82   Pulse (!) 101   Temp 98.9 F (37.2 C) (Oral)   Resp 20   Ht 5' (1.524 m)   Wt 60.3 kg   SpO2 93%   BMI 25.97 kg/m    Physical Exam  General Mental Status-Alert. General Appearance-Not in acute distress. Build & Nutrition-Well nourished. Posture-Normal posture. Gait-Normal.  Head and Neck Head-normocephalic, atraumatic with no lesions or palpable masses. Trachea-midline.  Chest and Lung Exam Chest and lung exam reveals -on auscultation, normal breath sounds, no adventitious sounds and normal vocal resonance.  Cardiovascular Cardiovascular examination reveals -normal heart sounds, regular rate and rhythm with no murmurs and no digital clubbing, cyanosis, edema, increased warmth or tenderness.  Abdomen Inspection Inspection of the abdomen reveals - No Hernias. Palpation/Percussion Palpation and Percussion of the abdomen reveal - Soft, Non Tender and No Rigidity (guarding).  Rectal Anorectal Exam External - normal external exam. Note: Approximally 5 cm of complete rectal prolapse present with Valsalva. Internal - decreased sphincter tone.  Neurologic Neurologic evaluation reveals -alert and oriented x 3 with no impairment of recent or remote memory, normal attention span and ability to concentrate, normal sensation and normal coordination.  Musculoskeletal Normal Exam - Bilateral-Upper Extremity Strength Normal and Lower Extremity Strength Normal.   ANOSCOPY, DIAGNOSTIC [id number]) [ Hemorrhoids  ] Procedure Other: Procedure: Anoscopy....Marland KitchenMarland KitchenSurgeon: Marcello Moores....Marland KitchenMarland KitchenAfter the risks and benefits were explained, verbal consent was obtained for above procedure. A medical assistant chaperone was present thoroughout the entire procedure. ....Marland KitchenMarland KitchenAnesthesia: none....Marland KitchenMarland KitchenDiagnosis: rectal prolapse....Marland KitchenMarland KitchenFindings: No masses, minimal hemorrhoid tissue, distal proctitis noted      Assessment & Plan  RECTAL PROLAPSE (K62.3) Impression: 59 year old female who presents to the office with complaints of rectal prolapse. She states that this is been present for approximately 15 years. On exam today, she does have approximately 5 cm of rectal prolapse on Valsalva. Surgical history significant for hysterectomy and umbilical hernia repair. Patient has bipolar disorder and chronic pain issues. She is on chronic narcotics. She reports chronic constipation and uses oxycodone and THC for this. Given that her last colonoscopy was approximately 4 years ago and she is due for repeat colonoscopy next year, we did repeat this before surgery. She would need a robotic rectopexy with sigmoid resection to correct her defect. We discussed that if she continues to have constipation and straining afterwards, this could result in a deadly leak of her anastomosis or recurrence of her prolapse. I believe she understands this and wishes to proceed. We also discussed that I cannot give her narcotics after discharge, since she has a pain contract with another doctor. We discussed the risk of fecal incontinence after surgery especially if she has loose stools. We discussed possible nerve damage causing bladder and sexual dysfunction.

## 2019-10-13 NOTE — Anesthesia Preprocedure Evaluation (Addendum)
Anesthesia Evaluation  Patient identified by MRN, date of birth, ID band Patient awake    Reviewed: Allergy & Precautions, NPO status , Patient's Chart, lab work & pertinent test results  Airway Mallampati: II  TM Distance: >3 FB Neck ROM: Full    Dental  (+) Edentulous Upper, Edentulous Lower   Pulmonary COPD,  COPD inhaler, Patient abstained from smoking., former smoker,    Pulmonary exam normal breath sounds clear to auscultation       Cardiovascular Exercise Tolerance: Good negative cardio ROS Normal cardiovascular exam Rhythm:Regular Rate:Normal     Neuro/Psych PSYCHIATRIC DISORDERS Anxiety Depression  Neuromuscular disease    GI/Hepatic Neg liver ROS,  RECTAL PROLAPSE   Endo/Other  Hypothyroidism   Renal/GU negative Renal ROS     Musculoskeletal  (+) Arthritis , Fibromyalgia -  Abdominal   Peds  (+) ADHD Hematology negative hematology ROS (+)   Anesthesia Other Findings Day of surgery medications reviewed with the patient.  Reproductive/Obstetrics                             Lab Results  Component Value Date   WBC 10.1 10/11/2019   HGB 12.0 10/11/2019   HCT 37.1 10/11/2019   MCV 95.1 10/11/2019   PLT 295 10/11/2019   Lab Results  Component Value Date   CREATININE 0.91 10/11/2019   BUN 15 10/11/2019   NA 137 10/11/2019   K 4.2 10/11/2019   CL 102 10/11/2019   CO2 29 10/11/2019    Anesthesia Physical  Anesthesia Plan  ASA: III  Anesthesia Plan: General   Post-op Pain Management:    Induction: Intravenous  PONV Risk Score and Plan: 4 or greater and Diphenhydramine, Scopolamine patch - Pre-op, Midazolam, Dexamethasone and Ondansetron  Airway Management Planned: Oral ETT  Additional Equipment:   Intra-op Plan:   Post-operative Plan: Extubation in OR  Informed Consent: I have reviewed the patients History and Physical, chart, labs and discussed the procedure  including the risks, benefits and alternatives for the proposed anesthesia with the patient or authorized representative who has indicated his/her understanding and acceptance.     Dental advisory given  Plan Discussed with: CRNA  Anesthesia Plan Comments:         Anesthesia Quick Evaluation

## 2019-10-14 ENCOUNTER — Other Ambulatory Visit: Payer: Self-pay

## 2019-10-14 LAB — CBC
HCT: 37.3 % (ref 36.0–46.0)
Hemoglobin: 12.4 g/dL (ref 12.0–15.0)
MCH: 30.8 pg (ref 26.0–34.0)
MCHC: 33.2 g/dL (ref 30.0–36.0)
MCV: 92.8 fL (ref 80.0–100.0)
Platelets: 261 10*3/uL (ref 150–400)
RBC: 4.02 MIL/uL (ref 3.87–5.11)
RDW: 15.1 % (ref 11.5–15.5)
WBC: 10.6 10*3/uL — ABNORMAL HIGH (ref 4.0–10.5)
nRBC: 0 % (ref 0.0–0.2)

## 2019-10-14 LAB — BASIC METABOLIC PANEL
Anion gap: 8 (ref 5–15)
BUN: 12 mg/dL (ref 6–20)
CO2: 25 mmol/L (ref 22–32)
Calcium: 8.4 mg/dL — ABNORMAL LOW (ref 8.9–10.3)
Chloride: 104 mmol/L (ref 98–111)
Creatinine, Ser: 0.91 mg/dL (ref 0.44–1.00)
GFR calc Af Amer: >60 mL/min (ref >60)
GFR calc non Af Amer: >60 mL/min (ref >60)
Glucose, Bld: 116 mg/dL — ABNORMAL HIGH (ref 70–99)
Potassium: 5 mmol/L (ref 3.5–5.1)
Sodium: 137 mmol/L (ref 135–145)

## 2019-10-14 MED ORDER — HYDROMORPHONE HCL 1 MG/ML IJ SOLN
0.5000 mg | INTRAMUSCULAR | Status: DC | PRN
  Administered 2019-10-14 – 2019-10-18 (×15): 0.5 mg via INTRAVENOUS
  Filled 2019-10-14 (×15): qty 0.5

## 2019-10-14 MED ORDER — CHLORHEXIDINE GLUCONATE CLOTH 2 % EX PADS
6.0000 | MEDICATED_PAD | Freq: Every day | CUTANEOUS | Status: DC
  Administered 2019-10-14 – 2019-10-26 (×12): 6 via TOPICAL

## 2019-10-14 MED ORDER — HYDROMORPHONE HCL 1 MG/ML IJ SOLN
0.5000 mg | Freq: Four times a day (QID) | INTRAMUSCULAR | Status: DC | PRN
  Administered 2019-10-14: 0.5 mg via INTRAVENOUS
  Filled 2019-10-14: qty 0.5

## 2019-10-14 NOTE — Progress Notes (Addendum)
Pt drowsy/groggy and c/o pain this AM. On assessment, JP bulb was removed from tubing, and a moderate amount of JP fluid was found on the bed pad. Bulb reattached. Dilaudid given for pain.   When changing the bed, 2 unidentified pills were found under the pt. Pt states she doesn't know where they came from. RN visualized pt take oral meds this shift, so unsure where these came from. Pt and family stated on admission that no home meds were in her belongings, and RN did not see any pill bottles. Pt educated again not to take anything unless we give it to her, pt verbalized understanding. Vitals stable, O2 remained low-mid 90s on 3L throughout the night via cont pulse ox. Will continue to monitor.

## 2019-10-14 NOTE — Progress Notes (Signed)
1 Day Post-Op   Subjective/Chief Complaint:  Somnolent this morning.  States pain well controlled   Objective: Vital signs in last 24 hours: Temp:  [97.8 F (36.6 C)-99.1 F (37.3 C)] 98.5 F (36.9 C) (02/13 0546) Pulse Rate:  [72-101] 89 (02/13 0546) Resp:  [12-20] 14 (02/13 0546) BP: (87-191)/(51-105) 104/83 (02/13 0546) SpO2:  [85 %-97 %] 95 % (02/13 0546) Weight:  [60.3 kg] 60.3 kg (02/12 1210) Last BM Date: 10/13/19  Intake/Output from previous day: 02/12 0701 - 02/13 0700 In: 2250 [I.V.:2000; IV Piggyback:250] Out: 2050 [Urine:1525; Drains:425; Blood:100] Intake/Output this shift: No intake/output data recorded.  General appearance: Somnolent and difficult to arouse Resp: Unlabored respiration GI: Soft, nondistended, minimally appropriately tender.  Incisions are clean dry and intact, drain output serosanguineous, somewhat high-volume Skin: Skin color, texture, turgor normal. No rashes or lesions  Lab Results:  Recent Labs    10/11/19 1031 10/14/19 0352  WBC 10.1 10.6*  HGB 12.0 12.4  HCT 37.1 37.3  PLT 295 261   BMET Recent Labs    10/11/19 1031 10/14/19 0352  NA 137 137  K 4.2 5.0  CL 102 104  CO2 29 25  GLUCOSE 89 116*  BUN 15 12  CREATININE 0.91 0.91  CALCIUM 9.1 8.4*   PT/INR No results for input(s): LABPROT, INR in the last 72 hours. ABG No results for input(s): PHART, HCO3 in the last 72 hours.  Invalid input(s): PCO2, PO2  Studies/Results: No results found.  Anti-infectives: Anti-infectives (From admission, onward)   Start     Dose/Rate Route Frequency Ordered Stop   10/13/19 2200  clindamycin (CLEOCIN) IVPB 900 mg     900 mg 100 mL/hr over 30 Minutes Intravenous Every 8 hours 10/13/19 2006 10/13/19 2329   10/13/19 1300  clindamycin (CLEOCIN) IVPB 900 mg  Status:  Discontinued     900 mg 100 mL/hr over 30 Minutes Intravenous  Once 10/13/19 1252 10/13/19 1959   10/13/19 1300  gentamicin (GARAMYCIN) 260 mg in dextrose 5 % 100 mL  IVPB  Status:  Discontinued     5 mg/kg  51.4 kg (Adjusted) 106.5 mL/hr over 60 Minutes Intravenous  Once 10/13/19 1252 10/13/19 1959      Assessment/Plan: S/p robotic rectopexy and resection.  Continue Foley today, plan to remove tomorrow. Continue JP to bulb suction. Multimodal pain control- decrease dilaudid given mental status now  Mobilize, pulmonary toilet  LOS: 1 day    Clovis Riley 10/14/2019
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
## 2019-10-14 NOTE — Progress Notes (Signed)
When RN was in pt's room, RN noticed bottle of misc pills in pt's bed that was not there during the morning shift assessment. The night RN had found two pills prior to shift as well.  Per pt, "they are pills I brought from home. One is a tums, and one is a pravastatin."  Ten pills were counted, and the pt could not identify the rest of them.  Pt anxious, c/o of pain in abd.  Fidgeting in bed and tearful.  No other pills found in bed.  Pt states she did not take any of the medication in the bottle.  RN called pharmacy, and pills to be sent home with husband.  RN will monitor.

## 2019-10-15 LAB — CBC
HCT: 37.8 % (ref 36.0–46.0)
Hemoglobin: 12.1 g/dL (ref 12.0–15.0)
MCH: 30.1 pg (ref 26.0–34.0)
MCHC: 32 g/dL (ref 30.0–36.0)
MCV: 94 fL (ref 80.0–100.0)
Platelets: 265 10*3/uL (ref 150–400)
RBC: 4.02 MIL/uL (ref 3.87–5.11)
RDW: 15.4 % (ref 11.5–15.5)
WBC: 9.2 10*3/uL (ref 4.0–10.5)
nRBC: 0 % (ref 0.0–0.2)

## 2019-10-15 LAB — BASIC METABOLIC PANEL
Anion gap: 9 (ref 5–15)
BUN: 8 mg/dL (ref 6–20)
CO2: 25 mmol/L (ref 22–32)
Calcium: 8.7 mg/dL — ABNORMAL LOW (ref 8.9–10.3)
Chloride: 103 mmol/L (ref 98–111)
Creatinine, Ser: 0.61 mg/dL (ref 0.44–1.00)
GFR calc Af Amer: >60 mL/min (ref >60)
GFR calc non Af Amer: >60 mL/min (ref >60)
Glucose, Bld: 110 mg/dL — ABNORMAL HIGH (ref 70–99)
Potassium: 4 mmol/L (ref 3.5–5.1)
Sodium: 137 mmol/L (ref 135–145)

## 2019-10-15 LAB — MAGNESIUM: Magnesium: 1.7 mg/dL (ref 1.7–2.4)

## 2019-10-15 MED ORDER — MAGNESIUM SULFATE 50 % IJ SOLN
3.0000 g | Freq: Once | INTRAVENOUS | Status: AC
  Administered 2019-10-15: 3 g via INTRAVENOUS
  Filled 2019-10-15: qty 6

## 2019-10-15 MED ORDER — SUVOREXANT 20 MG PO TABS
20.0000 mg | ORAL_TABLET | Freq: Every day | ORAL | Status: DC
  Administered 2019-10-19 – 2019-10-27 (×6): 20 mg via ORAL
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
## 2019-10-15 NOTE — Progress Notes (Signed)
2 Days Post-Op   Subjective/Chief Complaint:  Complains of pain at incisions. Denies bowel movement, some flatus   Objective: Vital signs in last 24 hours: Temp:  [98.8 F (37.1 C)-100.5 F (38.1 C)] 99.3 F (37.4 C) (02/14 0455) Pulse Rate:  [91-100] 98 (02/14 0455) Resp:  [14-19] 19 (02/14 0455) BP: (106-127)/(64-84) 106/67 (02/14 0455) SpO2:  [93 %-96 %] 93 % (02/14 0455) Last BM Date: 10/14/19  Intake/Output from previous day: 02/13 0701 - 02/14 0700 In: 457 [P.O.:457] Out: 1570 [Urine:1400; Drains:170] Intake/Output this shift: No intake/output data recorded.  General appearance: alert, cooperative Resp: Unlabored respiration GI: Soft, minimally distended,appropriately tender no peritonitis.  Incisions are clean dry and intact, drain output serous Skin: Skin color, texture, turgor normal. No rashes or lesions  Lab Results:  Recent Labs    10/14/19 0352 10/15/19 0449  WBC 10.6* 9.2  HGB 12.4 12.1  HCT 37.3 37.8  PLT 261 265   BMET Recent Labs    10/14/19 0352 10/15/19 0449  NA 137 137  K 5.0 4.0  CL 104 103  CO2 25 25  GLUCOSE 116* 110*  BUN 12 8  CREATININE 0.91 0.61  CALCIUM 8.4* 8.7*   PT/INR No results for input(s): LABPROT, INR in the last 72 hours. ABG No results for input(s): PHART, HCO3 in the last 72 hours.  Invalid input(s): PCO2, PO2  Studies/Results: No results found.  Anti-infectives: Anti-infectives (From admission, onward)   Start     Dose/Rate Route Frequency Ordered Stop   10/13/19 2200  clindamycin (CLEOCIN) IVPB 900 mg     900 mg 100 mL/hr over 30 Minutes Intravenous Every 8 hours 10/13/19 2006 10/13/19 2329   10/13/19 1300  clindamycin (CLEOCIN) IVPB 900 mg  Status:  Discontinued     900 mg 100 mL/hr over 30 Minutes Intravenous  Once 10/13/19 1252 10/13/19 1959   10/13/19 1300  gentamicin (GARAMYCIN) 260 mg in dextrose 5 % 100 mL IVPB  Status:  Discontinued     5 mg/kg  51.4 kg (Adjusted) 106.5 mL/hr over 60 Minutes  Intravenous  Once 10/13/19 1252 10/13/19 1959      Assessment/Plan: S/p robotic rectopexy and resection 10/13/19.  Foley out Continue JP to bulb suction. Multimodal pain control- pt reports allergies to most other pain meds. No additional narcotics or sedating meds will be added.  Mobilize, pulmonary toilet Try soft diet Replace mag    LOS: 2 days    Jamie Schultz 10/15/2019

## 2019-10-15 NOTE — Progress Notes (Signed)
Pt drowsy/groggy this AM. When changing the bed, one orange pill was found in the bed. Pt states she doesn't know where it came from and that she did not take any pills last night. RN visualized pt swallow her meds last night to ensure none were dropped. No pill bottles were found in the room. Pt educated not to take anything unless we give it to her. Pt verbalized understanding. Vitals stable. O2 remained mid-low 90's on 3L throughout the night via cont pulse ox. Will continue to monitor.

## 2019-10-16 LAB — BASIC METABOLIC PANEL
Anion gap: 8 (ref 5–15)
BUN: 10 mg/dL (ref 6–20)
CO2: 28 mmol/L (ref 22–32)
Calcium: 8.9 mg/dL (ref 8.9–10.3)
Chloride: 102 mmol/L (ref 98–111)
Creatinine, Ser: 0.78 mg/dL (ref 0.44–1.00)
GFR calc Af Amer: >60 mL/min (ref >60)
GFR calc non Af Amer: >60 mL/min (ref >60)
Glucose, Bld: 110 mg/dL — ABNORMAL HIGH (ref 70–99)
Potassium: 3.8 mmol/L (ref 3.5–5.1)
Sodium: 138 mmol/L (ref 135–145)

## 2019-10-16 LAB — CBC
HCT: 40.5 % (ref 36.0–46.0)
Hemoglobin: 12.9 g/dL (ref 12.0–15.0)
MCH: 30.2 pg (ref 26.0–34.0)
MCHC: 31.9 g/dL (ref 30.0–36.0)
MCV: 94.8 fL (ref 80.0–100.0)
Platelets: 268 10*3/uL (ref 150–400)
RBC: 4.27 MIL/uL (ref 3.87–5.11)
RDW: 15.4 % (ref 11.5–15.5)
WBC: 8.7 10*3/uL (ref 4.0–10.5)
nRBC: 0 % (ref 0.0–0.2)

## 2019-10-16 LAB — SURGICAL PATHOLOGY

## 2019-10-16 MED ORDER — MAGNESIUM OXIDE 400 (241.3 MG) MG PO TABS
400.0000 mg | ORAL_TABLET | Freq: Every day | ORAL | Status: DC
  Administered 2019-10-16 – 2019-10-18 (×3): 400 mg via ORAL
  Filled 2019-10-16 (×3): qty 1

## 2019-10-16 NOTE — Plan of Care (Signed)

## 2019-10-16 NOTE — Progress Notes (Signed)
3 Days Post-Op Robotic Rectopexy and sigmoid resection  Subjective/Chief Complaint:  Complains of pain at incisions. Had a small bowel movement   Objective: Vital signs in last 24 hours: Temp:  [97.5 F (36.4 C)-98.6 F (37 C)] 98 F (36.7 C) (02/15 0635) Pulse Rate:  [100-101] 101 (02/15 0635) Resp:  [15-16] 16 (02/15 0635) BP: (103-126)/(78-91) 126/78 (02/15 0635) SpO2:  [92 %-94 %] 94 % (02/15 0635) Last BM Date: 10/16/19  Intake/Output from previous day: 02/14 0701 - 02/15 0700 In: 720 [P.O.:720] Out: 140 [Drains:140] Intake/Output this shift: Total I/O In: -  Out: 10 [Drains:10]  General appearance: alert, cooperative Resp: Unlabored respiration GI: Soft, distended,appropriately tender no peritonitis.  Incisions are clean dry and intact, drain output serous Skin: Skin color, texture, turgor normal. No rashes or lesions  Lab Results:  Recent Labs    10/15/19 0449 10/16/19 0335  WBC 9.2 8.7  HGB 12.1 12.9  HCT 37.8 40.5  PLT 265 268   BMET Recent Labs    10/15/19 0449 10/16/19 0335  NA 137 138  K 4.0 3.8  CL 103 102  CO2 25 28  GLUCOSE 110* 110*  BUN 8 10  CREATININE 0.61 0.78  CALCIUM 8.7* 8.9   PT/INR No results for input(s): LABPROT, INR in the last 72 hours. ABG No results for input(s): PHART, HCO3 in the last 72 hours.  Invalid input(s): PCO2, PO2  Studies/Results: No results found.  Anti-infectives: Anti-infectives (From admission, onward)   Start     Dose/Rate Route Frequency Ordered Stop   10/13/19 2200  clindamycin (CLEOCIN) IVPB 900 mg     900 mg 100 mL/hr over 30 Minutes Intravenous Every 8 hours 10/13/19 2006 10/13/19 2329   10/13/19 1300  clindamycin (CLEOCIN) IVPB 900 mg  Status:  Discontinued     900 mg 100 mL/hr over 30 Minutes Intravenous  Once 10/13/19 1252 10/13/19 1959   10/13/19 1300  gentamicin (GARAMYCIN) 260 mg in dextrose 5 % 100 mL IVPB  Status:  Discontinued     5 mg/kg  51.4 kg (Adjusted) 106.5 mL/hr over 60  Minutes Intravenous  Once 10/13/19 1252 10/13/19 1959      Assessment/Plan: S/p robotic rectopexy and resection 10/13/19.  Continue JP to bulb suction. Multimodal pain control- pt reports allergies to most other pain meds. No additional narcotics or sedating meds will be added.  Mobilize, pulmonary toilet Try soft diet as tolerated Ambulate Wean O2    LOS: 3 days    [name] [phone]

## 2019-10-16 NOTE — Progress Notes (Signed)
Leighton Ruff, MD paged regarding the pt's JP dressing being saturated x2 this morning.

## 2019-10-16 NOTE — Progress Notes (Signed)
Pt with increased abd distention and pain rated 9/10.  JP drain with bubbles in the tubing, and no drainage in bulb, serosang drainage at tube insertion site.  Amion text page sent to to CSA on call [place]

## 2019-10-16 NOTE — Progress Notes (Signed)
Report given to [facility], the receiving RN on [place].
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
## 2019-10-17 LAB — BASIC METABOLIC PANEL
Anion gap: 11 (ref 5–15)
BUN: 18 mg/dL (ref 6–20)
CO2: 26 mmol/L (ref 22–32)
Calcium: 8.7 mg/dL — ABNORMAL LOW (ref 8.9–10.3)
Chloride: 102 mmol/L (ref 98–111)
Creatinine, Ser: 1.01 mg/dL — ABNORMAL HIGH (ref 0.44–1.00)
GFR calc Af Amer: >60 mL/min (ref >60)
GFR calc non Af Amer: >60 mL/min (ref >60)
Glucose, Bld: 107 mg/dL — ABNORMAL HIGH (ref 70–99)
Potassium: 4 mmol/L (ref 3.5–5.1)
Sodium: 139 mmol/L (ref 135–145)

## 2019-10-17 LAB — CBC
HCT: 38.9 % (ref 36.0–46.0)
Hemoglobin: 12.7 g/dL (ref 12.0–15.0)
MCH: 30.6 pg (ref 26.0–34.0)
MCHC: 32.6 g/dL (ref 30.0–36.0)
MCV: 93.7 fL (ref 80.0–100.0)
Platelets: 314 10*3/uL (ref 150–400)
RBC: 4.15 MIL/uL (ref 3.87–5.11)
RDW: 15.2 % (ref 11.5–15.5)
WBC: 8 10*3/uL (ref 4.0–10.5)
nRBC: 0 % (ref 0.0–0.2)

## 2019-10-17 NOTE — Progress Notes (Signed)
AMION page sent to general surgery regarding patient JP bulb. Abd distented and no drainage in bulb, dressing staurated with draining.

## 2019-10-17 NOTE — Progress Notes (Signed)
4 Days Post-Op Robotic Rectopexy and sigmoid resection  Subjective/Chief Complaint:  Complains of pain at incisions.  No BM or flatus.  Feels more distended.    Objective: Vital signs in last 24 hours: Temp:  [97.6 F (36.4 C)-98.5 F (36.9 C)] 97.6 F (36.4 C) (02/16 0440) Pulse Rate:  [98] 98 (02/16 0440) Resp:  [16-17] 17 (02/16 0440) BP: (91-144)/(67-99) 91/68 (02/16 0440) SpO2:  [92 %-97 %] 92 % (02/16 0440) Weight:  [58.3 kg] 58.3 kg (02/16 0500) Last BM Date: 10/16/19  Intake/Output from previous day: 02/15 0701 - 02/16 0700 In: 296 [P.O.:296] Out: 20 [Drains:20] Intake/Output this shift: No intake/output data recorded.  General appearance: alert, cooperative Resp: Unlabored respiration GI: Soft, distended,appropriately tender no peritonitis.  Incisions are clean dry and intact, drain output serous Skin: Skin color, texture, turgor normal. No rashes or lesions  Lab Results:  Recent Labs    10/15/19 0449 10/16/19 0335  WBC 9.2 8.7  HGB 12.1 12.9  HCT 37.8 40.5  PLT 265 268   BMET Recent Labs    10/15/19 0449 10/16/19 0335  NA 137 138  K 4.0 3.8  CL 103 102  CO2 25 28  GLUCOSE 110* 110*  BUN 8 10  CREATININE 0.61 0.78  CALCIUM 8.7* 8.9   PT/INR No results for input(s): LABPROT, INR in the last 72 hours. ABG No results for input(s): PHART, HCO3 in the last 72 hours.  Invalid input(s): PCO2, PO2  Studies/Results: No results found.  Anti-infectives: Anti-infectives (From admission, onward)   Start     Dose/Rate Route Frequency Ordered Stop   10/13/19 2200  clindamycin (CLEOCIN) IVPB 900 mg     900 mg 100 mL/hr over 30 Minutes Intravenous Every 8 hours 10/13/19 2006 10/13/19 2329   10/13/19 1300  clindamycin (CLEOCIN) IVPB 900 mg  Status:  Discontinued     900 mg 100 mL/hr over 30 Minutes Intravenous  Once 10/13/19 1252 10/13/19 1959   10/13/19 1300  gentamicin (GARAMYCIN) 260 mg in dextrose 5 % 100 mL IVPB  Status:  Discontinued     5 mg/kg   51.4 kg (Adjusted) 106.5 mL/hr over 60 Minutes Intravenous  Once 10/13/19 1252 10/13/19 1959      Assessment/Plan: S/p robotic rectopexy and resection 10/13/19.  Continue JP to bulb suction. Multimodal pain control- pt reports allergies to most other pain meds. No additional narcotics or sedating meds will be added.  Mobilize, pulmonary toilet NPO until bowel function returns Ambulate Wean O2 as tolerated    LOS: 4 days    [name] [id number]

## 2019-10-18 ENCOUNTER — Inpatient Hospital Stay (HOSPITAL_COMMUNITY): Payer: Medicaid Other

## 2019-10-18 LAB — BASIC METABOLIC PANEL
Anion gap: 11 (ref 5–15)
BUN: 17 mg/dL (ref 6–20)
CO2: 23 mmol/L (ref 22–32)
Calcium: 8.5 mg/dL — ABNORMAL LOW (ref 8.9–10.3)
Chloride: 103 mmol/L (ref 98–111)
Creatinine, Ser: 0.9 mg/dL (ref 0.44–1.00)
GFR calc Af Amer: >60 mL/min (ref >60)
GFR calc non Af Amer: >60 mL/min (ref >60)
Glucose, Bld: 90 mg/dL (ref 70–99)
Potassium: 4.1 mmol/L (ref 3.5–5.1)
Sodium: 137 mmol/L (ref 135–145)

## 2019-10-18 LAB — CBC
HCT: 38.9 % (ref 36.0–46.0)
Hemoglobin: 12.6 g/dL (ref 12.0–15.0)
MCH: 30.3 pg (ref 26.0–34.0)
MCHC: 32.4 g/dL (ref 30.0–36.0)
MCV: 93.5 fL (ref 80.0–100.0)
Platelets: 323 10*3/uL (ref 150–400)
RBC: 4.16 MIL/uL (ref 3.87–5.11)
RDW: 15.1 % (ref 11.5–15.5)
WBC: 7.4 10*3/uL (ref 4.0–10.5)
nRBC: 0 % (ref 0.0–0.2)

## 2019-10-18 MED ORDER — MORPHINE SULFATE (PF) 2 MG/ML IV SOLN
1.0000 mg | INTRAVENOUS | Status: DC | PRN
  Administered 2019-10-18: 1 mg via INTRAVENOUS
  Administered 2019-10-18 – 2019-10-19 (×2): 2 mg via INTRAVENOUS
  Filled 2019-10-18 (×4): qty 1

## 2019-10-18 MED ORDER — LIP MEDEX EX OINT
TOPICAL_OINTMENT | CUTANEOUS | Status: AC
  Administered 2019-10-18: 1
  Filled 2019-10-18: qty 7

## 2019-10-18 MED ORDER — MORPHINE SULFATE (PF) 2 MG/ML IV SOLN
2.0000 mg | INTRAVENOUS | Status: DC | PRN
  Administered 2019-10-18: 18:00:00 2 mg via INTRAVENOUS
  Administered 2019-10-19 (×3): 4 mg via INTRAVENOUS
  Filled 2019-10-18 (×3): qty 2
  Filled 2019-10-18: qty 1
  Filled 2019-10-18: qty 2

## 2019-10-18 MED ORDER — LIDOCAINE HCL URETHRAL/MUCOSAL 2 % EX GEL
1.0000 "application " | Freq: Once | CUTANEOUS | Status: AC
  Administered 2019-10-18: 1 via TOPICAL
  Filled 2019-10-18: qty 30

## 2019-10-18 MED ORDER — LIP MEDEX EX OINT: TOPICAL_OINTMENT | CUTANEOUS | Status: DC | PRN

## 2019-10-18 NOTE — Progress Notes (Signed)
Changed dressing due to drainage and saturated dressing. No draining in JP bulb.

## 2019-10-18 NOTE — Progress Notes (Signed)
Attempted to place NG tube again. Jamie Schultz , RN and [place], [place] was with me. We gave PRN Morphine prior to attempt to insert NG tube. Patient also used the lidocaine gel for her nose again. We started to insert NG tube and patient started to move head around and turning over on her side and stated that she couldn't do the NG tube and refused to proceed. Afford to attempt again and informed pt that this would be the best time b/c she had the medication on board. Pt still refused the NG tube.

## 2019-10-18 NOTE — Progress Notes (Signed)
Attempted to place an NG tube x4. Attempted in both nostrils. Patient fights, and pushes tube out every time we try to get tube down. Patient husband in room during the attempts. Navreet, RN was with me during the attempts and attempted 3 out of the 4 times. Dr. Marcello Moores is aware of the refusal and states that restraints will probably not help patient as she is very strong and fighting staff during attempts to insert NG tube. She states that IR is not an option for this placement. She states that the narcotics need to be used very little as these medications will not help patient SBO / ileus.

## 2019-10-18 NOTE — Progress Notes (Signed)
5 Days Post-Op Robotic Rectopexy and sigmoid resection  Subjective/Chief Complaint:  Complains of abd pain and distention.  No BM or flatus.  Ambulating very little  Objective: Vital signs in last 24 hours: Temp:  [98.2 F (36.8 C)-98.3 F (36.8 C)] 98.2 F (36.8 C) (02/17 1335) Pulse Rate:  [90-95] 90 (02/17 1335) Resp:  [18-20] 18 (02/17 1335) BP: (131-183)/(81-110) 183/110 (02/17 1335) SpO2:  [91 %-94 %] 92 % (02/17 1335) Weight:  [61.5 kg] 61.5 kg (02/17 0409) Last BM Date: 10/17/19  Intake/Output from previous day: 02/16 0701 - 02/17 0700 In: 355 [P.O.:355] Out: 5 [Drains:5] Intake/Output this shift: No intake/output data recorded.  General appearance: alert, cooperative Resp: Unlabored respiration GI: Soft, distended,appropriately tender no peritonitis.  Incisions are clean dry and intact, drain output serous Skin: Skin color, texture, turgor normal. No rashes or lesions  Lab Results:  Recent Labs    10/17/19 1032 10/18/19 0506  WBC 8.0 7.4  HGB 12.7 12.6  HCT 38.9 38.9  PLT 314 323   BMET Recent Labs    10/17/19 1032 10/18/19 0506  NA 139 137  K 4.0 4.1  CL 102 103  CO2 26 23  GLUCOSE 107* 90  BUN 18 17  CREATININE 1.01* 0.90  CALCIUM 8.7* 8.5*   PT/INR No results for input(s): LABPROT, INR in the last 72 hours. ABG No results for input(s): PHART, HCO3 in the last 72 hours.  Invalid input(s): PCO2, PO2  Studies/Results: DG Abd Portable 1V  Result Date: 10/18/2019 CLINICAL DATA:  Abdominal distention EXAM: PORTABLE ABDOMEN - 1 VIEW COMPARISON:  06/13/2014 FINDINGS: Dilated predominantly small bowel loops are noted in the abdomen. Appearance is concerning for high-grade small bowel obstruction. No organomegaly or free air. Postoperative changes in the lower lumbar spine. Some form of catheter projects over the abdomen, unclear of exact location or type. IMPRESSION: Bowel dilatation concerning for high-grade small bowel obstruction. Electronically  Signed   By: Rolm Baptise M.D.   On: 10/18/2019 09:34    Anti-infectives: Anti-infectives (From admission, onward)   Start     Dose/Rate Route Frequency Ordered Stop   10/13/19 2200  clindamycin (CLEOCIN) IVPB 900 mg     900 mg 100 mL/hr over 30 Minutes Intravenous Every 8 hours 10/13/19 2006 10/13/19 2329   10/13/19 1300  clindamycin (CLEOCIN) IVPB 900 mg  Status:  Discontinued     900 mg 100 mL/hr over 30 Minutes Intravenous  Once 10/13/19 1252 10/13/19 1959   10/13/19 1300  gentamicin (GARAMYCIN) 260 mg in dextrose 5 % 100 mL IVPB  Status:  Discontinued     5 mg/kg  51.4 kg (Adjusted) 106.5 mL/hr over 60 Minutes Intravenous  Once 10/13/19 1252 10/13/19 1959      Assessment/Plan: S/p robotic rectopexy and resection 10/13/19.  Continue JP to bulb suction. Multimodal pain control- pt reports allergies to most other pain meds. No additional narcotics or sedating meds will be added.  Mobilize, pulmonary toilet AXR shows ileus.  Pt does not appear toxic.  NPO until bowel function returns.  I have recommended NG placement but she is currently refusing Ambulate     LOS: 5 days    [name] [id number]

## 2019-10-19 ENCOUNTER — Inpatient Hospital Stay (HOSPITAL_COMMUNITY): Payer: Medicaid Other

## 2019-10-19 MED ORDER — HYDROMORPHONE HCL 1 MG/ML IJ SOLN
0.5000 mg | INTRAMUSCULAR | Status: DC | PRN
  Administered 2019-10-19 – 2019-10-20 (×3): 0.5 mg via INTRAVENOUS
  Filled 2019-10-19 (×3): qty 0.5

## 2019-10-19 NOTE — Progress Notes (Signed)
Dressing changed to JP drain as ordered.

## 2019-10-19 NOTE — Significant Event (Addendum)
Rapid Response Event Note  Overview: Time Called: 2035 Arrival Time: 2110 Event Type: Other (Comment)(Nurse concerned about patient wanting a second "set of eyes")  Initial Focused Assessment:  Called to room by Jackelyn Poling (RN) d/t concerned about patient care, and requesting a second pair of eyes to look at patient.  Debbie's concerns are patient has a distended abdomen that has increased rather than decreased, patient in pain, Dr. Johney Maine (on-call for [facility]) was called with no further orders at this time.  Debbie advised that she didn't need me immediately, so I spent approximately 20-30 minutes reviewing chart.  Pt admitted post-operatively after a robotic rectopexy and resection on 2/21.  Hx:  Rectal Prolapse, Chronic pain issues.  Denies and ETOH usage.  Did admit to occasional marijuana use.  (other history per notes).  Arrived at room to see patient sitting on [facility] with staff in room assisting her.  I waited until patient done using BSC and was back in bed before entering room.  Pt is alert and oriented x4, follows commands, expresses that she is very anxious, and in pain.  Pain is all in abdomen, when asked to describe pain just massages abdomen and says it hurts.  Last BP 183/110, but patient in pain and RN has given pain med just prior to my arrival.  Abd X-ray on 2/17 report shows "bowel dilation concerning for high grade SBO".  Pt has refused NGT and se numerous notes where patient has been educated regarding reason for NGT and patient still refusing.  Also  patinet has told me that she has been refusing to ambulated for the last few days d/t her pain.  I talked with patient for roughly 10-15 minutes and educated her on the reason for the NGT and the reason that she needs to get up and ambulate per MD orders.  Patient agreed to NGT placement and advised that she understands that she needs to ambulate more and verbalized that she would ambulate later on.  I will stay on unit to assist with  NGT placement, however, at present patient is stable.  Able to place NGT with no difficulty.  Placed to low intermittent suction.  KUB placed per protocol to confirm placement of NGT.  Interventions:  Rapid Response assessment, assit with NGT placement  Plan of Care (if not transferred):  Patient stable at present.  No need at present to transfer patient to a higher level of care.  If any deterioration in patient's assessment or any concerns please call rapid response back at [phone].   Event End Time: 2145  Jacqulyn Ducking ICU/SD RN4 / Care Coordinator / Rapid Response Nurse Rapid Response Number:  [phone] ICU Charge Nurse Number:  [id number]

## 2019-10-19 NOTE — Progress Notes (Signed)
6 Days Post-Op Robotic Rectopexy and sigmoid resection  Subjective/Chief Complaint:  Pain and distention better this am.  No nausea, passing flatus.  Ambulating some, Hungry  Objective: Vital signs in last 24 hours: Temp:  [98.2 F (36.8 C)-99 F (37.2 C)] 98.3 F (36.8 C) (02/18 0501) Pulse Rate:  [89-91] 89 (02/18 0501) Resp:  [18-20] 18 (02/18 0501) BP: (103-183)/(67-110) 103/67 (02/18 0501) SpO2:  [90 %-93 %] 90 % (02/18 0501) Weight:  [60.2 kg] 60.2 kg (02/18 0500) Last BM Date: 10/17/19  Intake/Output from previous day: No intake/output data recorded. Intake/Output this shift: No intake/output data recorded.  General appearance: alert, cooperative Resp: Unlabored respiration GI: Soft, less distended,appropriately tender no peritonitis.  Incisions are clean dry and intact, drain output serous Skin: Skin color, texture, turgor normal. No rashes or lesions  Lab Results:  Recent Labs    10/17/19 1032 10/18/19 0506  WBC 8.0 7.4  HGB 12.7 12.6  HCT 38.9 38.9  PLT 314 323   BMET Recent Labs    10/17/19 1032 10/18/19 0506  NA 139 137  K 4.0 4.1  CL 102 103  CO2 26 23  GLUCOSE 107* 90  BUN 18 17  CREATININE 1.01* 0.90  CALCIUM 8.7* 8.5*   PT/INR No results for input(s): LABPROT, INR in the last 72 hours. ABG No results for input(s): PHART, HCO3 in the last 72 hours.  Invalid input(s): PCO2, PO2  Studies/Results: DG Abd Portable 1V  Result Date: 10/18/2019 CLINICAL DATA:  Abdominal distention EXAM: PORTABLE ABDOMEN - 1 VIEW COMPARISON:  06/13/2014 FINDINGS: Dilated predominantly small bowel loops are noted in the abdomen. Appearance is concerning for high-grade small bowel obstruction. No organomegaly or free air. Postoperative changes in the lower lumbar spine. Some form of catheter projects over the abdomen, unclear of exact location or type. IMPRESSION: Bowel dilatation concerning for high-grade small bowel obstruction. Electronically Signed   By: Rolm Baptise M.D.   On: 10/18/2019 09:34    Anti-infectives: Anti-infectives (From admission, onward)   Start     Dose/Rate Route Frequency Ordered Stop   10/13/19 2200  clindamycin (CLEOCIN) IVPB 900 mg     900 mg 100 mL/hr over 30 Minutes Intravenous Every 8 hours 10/13/19 2006 10/13/19 2329   10/13/19 1300  clindamycin (CLEOCIN) IVPB 900 mg  Status:  Discontinued     900 mg 100 mL/hr over 30 Minutes Intravenous  Once 10/13/19 1252 10/13/19 1959   10/13/19 1300  gentamicin (GARAMYCIN) 260 mg in dextrose 5 % 100 mL IVPB  Status:  Discontinued     5 mg/kg  51.4 kg (Adjusted) 106.5 mL/hr over 60 Minutes Intravenous  Once 10/13/19 1252 10/13/19 1959      Assessment/Plan: S/p robotic rectopexy and resection 10/13/19.  Continue JP to bulb suction. Multimodal pain control Mobilize, pulmonary toilet Ileus seems to be improving, will start clears Ambulate     LOS: 6 days    [name] [id number]

## 2019-10-19 NOTE — Progress Notes (Signed)
Dressing changed to JP drain site. 3 drain site dressings applied.  Previous dressings saturated with serosanguinous drainage.

## 2019-10-19 NOTE — Progress Notes (Signed)
Report given. Patient emotional and in distress and anxious. Patient abdomen showing signifcant distention. Patient educated on NG tube,  pain management and safety. Patient refuses to discuss NG tube.PRN medicine given. MD notified

## 2019-10-19 NOTE — Progress Notes (Signed)
Patient refused all AM PO medications stating she would "just vomit them up". Patient has had repeated complaint of abdominal pain and discomfort since 0945. Patient had attempted to eat jello and drink clear soda since diet change. Morphine 4 mg IV and Zofran 4 mg IV given for symptom relief. Patient stated pain medication was ineffective. Heat therapy applied to abdomen. Patient stated it was minimally effective and requested additional pain medication in the form of Dilaudid. Advised patient there was no order for Dilaudid. Offered patient Oxycodone 10 mg PO. Initially, patient refused but then said she would take it. Patient was given Oxycodone 10 mg and Xanax 1 mg PO. Additionally, patient was given Maalox 30 ml for indigestion. Call bell within reach. Will continue to monitor.

## 2019-10-19 NOTE — Progress Notes (Signed)
MD notified in regards to patient condition.Verbal orders given.  Patient re-educated on pain and importance of Ng tube. Patient states she understand and agreed.

## 2019-10-20 ENCOUNTER — Inpatient Hospital Stay: Payer: Self-pay

## 2019-10-20 MED ORDER — TRAVASOL 10 % IV SOLN
INTRAVENOUS | Status: AC
  Filled 2019-10-20: qty 480

## 2019-10-20 MED ORDER — PHENOL 1.4 % MT LIQD
2.0000 | OROMUCOSAL | Status: DC | PRN
  Filled 2019-10-20: qty 177

## 2019-10-20 MED ORDER — INSULIN ASPART 100 UNIT/ML ~~LOC~~ SOLN
0.0000 [IU] | SUBCUTANEOUS | Status: DC
  Administered 2019-10-21 (×4): 1 [IU] via SUBCUTANEOUS
  Administered 2019-10-21: 2 [IU] via SUBCUTANEOUS
  Administered 2019-10-21: 08:00:00 1 [IU] via SUBCUTANEOUS
  Administered 2019-10-22: 2 [IU] via SUBCUTANEOUS
  Administered 2019-10-22 (×3): 1 [IU] via SUBCUTANEOUS
  Administered 2019-10-22: 2 [IU] via SUBCUTANEOUS
  Administered 2019-10-22 – 2019-10-23 (×4): 1 [IU] via SUBCUTANEOUS
  Administered 2019-10-24: 2 [IU] via SUBCUTANEOUS
  Administered 2019-10-24: 1 [IU] via SUBCUTANEOUS

## 2019-10-20 MED ORDER — SODIUM CHLORIDE 0.9% FLUSH
10.0000 mL | INTRAVENOUS | Status: DC | PRN
  Administered 2019-10-26: 18:00:00 20 mL
  Administered 2019-10-26: 10 mL

## 2019-10-20 MED ORDER — LACTATED RINGERS IV BOLUS: 1000.0000 mL | Freq: Three times a day (TID) | INTRAVENOUS | Status: AC | PRN

## 2019-10-20 MED ORDER — PROCHLORPERAZINE EDISYLATE 10 MG/2ML IJ SOLN
5.0000 mg | INTRAMUSCULAR | Status: DC | PRN
  Administered 2019-10-21 – 2019-10-22 (×4): 10 mg via INTRAVENOUS
  Filled 2019-10-20 (×4): qty 2

## 2019-10-20 MED ORDER — MENTHOL 3 MG MT LOZG: 1.0000 | LOZENGE | OROMUCOSAL | Status: DC | PRN

## 2019-10-20 MED ORDER — LIP MEDEX EX OINT
1.0000 "application " | TOPICAL_OINTMENT | Freq: Two times a day (BID) | CUTANEOUS | Status: DC
  Administered 2019-10-20 – 2019-10-27 (×11): 1 via TOPICAL
  Filled 2019-10-20 (×2): qty 7

## 2019-10-20 MED ORDER — MAGIC MOUTHWASH
15.0000 mL | Freq: Four times a day (QID) | ORAL | Status: DC | PRN
  Filled 2019-10-20: qty 15

## 2019-10-20 MED ORDER — METHOCARBAMOL 1000 MG/10ML IJ SOLN
1000.0000 mg | Freq: Four times a day (QID) | INTRAVENOUS | Status: DC | PRN
  Administered 2019-10-20: 1000 mg via INTRAVENOUS
  Filled 2019-10-20 (×2): qty 10

## 2019-10-20 MED ORDER — LORAZEPAM 2 MG/ML IJ SOLN
0.5000 mg | Freq: Four times a day (QID) | INTRAMUSCULAR | Status: DC | PRN
  Administered 2019-10-20 (×2): 1 mg via INTRAVENOUS
  Administered 2019-10-20: 08:00:00 0.5 mg via INTRAVENOUS
  Administered 2019-10-21 – 2019-10-25 (×4): 1 mg via INTRAVENOUS
  Filled 2019-10-20 (×7): qty 1

## 2019-10-20 MED ORDER — HYDROMORPHONE HCL 1 MG/ML IJ SOLN
0.5000 mg | INTRAMUSCULAR | Status: DC | PRN
  Administered 2019-10-20 (×2): 2 mg via INTRAVENOUS
  Administered 2019-10-20: 09:00:00 1 mg via INTRAVENOUS
  Administered 2019-10-20 (×3): 2 mg via INTRAVENOUS
  Administered 2019-10-20: 08:00:00 1 mg via INTRAVENOUS
  Administered 2019-10-21 – 2019-10-24 (×20): 2 mg via INTRAVENOUS
  Filled 2019-10-20 (×3): qty 2
  Filled 2019-10-20: qty 1
  Filled 2019-10-20 (×7): qty 2
  Filled 2019-10-20: qty 1
  Filled 2019-10-20 (×7): qty 2
  Filled 2019-10-20: qty 1
  Filled 2019-10-20 (×8): qty 2

## 2019-10-20 MED ORDER — SIMETHICONE 40 MG/0.6ML PO SUSP
40.0000 mg | Freq: Four times a day (QID) | ORAL | Status: DC | PRN
  Administered 2019-10-20 – 2019-10-26 (×7): 40 mg via ORAL
  Filled 2019-10-20 (×10): qty 0.6

## 2019-10-20 NOTE — Progress Notes (Signed)
Initial Nutrition Assessment  DOCUMENTATION CODES:   Not applicable  INTERVENTION:  TPN per pharmacy  Monitor refeeding labs  Advance diet as medically feasible  NUTRITION DIAGNOSIS:   Inadequate oral intake related to altered GI function(ileus s/p robotic rectopexy with sigmoid resection) as evidenced by NPO status.   GOAL:   Patient will meet greater than or equal to 90% of their needs    MONITOR:   Labs, I & O's, Skin, Weight trends, Diet advancement  REASON FOR ASSESSMENT:   Consult New TPN/TNA  ASSESSMENT:  RD working remotely.  59 year old female with past medical history of COPD, chronic back pain, fibromyalgia, arthritis, anxiety, bipolar disorder, presented with15 year history of rectal prolapse that has gotten worse over the past few years. Patient reports significant pain, rectal bleeding and endorses long-standing history of constipation and straining.  Patient is s/p robotic rectopexy with sigmoid resection on 2/12  2/15 Soft diet  2/16 NPO until bowel function returns 2/17 SBO/ileus; unsuccessful NG placement x 4 attempts 2/18 Ileus improving, CL diet  Per notes, rapid response called on 2/18 at 2035 d/t concern about patient care. Patient reports anxiety and abdominal pain. RN reporting increasing abdomen distention. Rapid response educated patient on NGT and reasons for ambulation. Patient agreeable to NGT and placed without difficulty. Placed on low intermittent suction.   Meal history reviewed, patient documented with 45% x 1 meal on 2/15 and 0% intake of meals on 2/14 and 2/16. RD consulted, patient to start TPN today.   I/Os: +203 ml since admit    -90 ml x 24 hrs JP drain: 270 ml x 24 hrs  Admit wt 128.26 lbs Current wt 130.68 lbs Weight history reviewed, stable 120 - 130 lbs over the past year.   Medications reviewed and include: SS novolog Drips: Robaxin  Labs: BG 90, 107 x 24 hrs  NUTRITION - FOCUSED PHYSICAL EXAM: Unable to  complete at this time, RD working remotely.  Diet Order:   Diet Order            Diet NPO time specified Except for: Ice Chips, Sips with Meds  Diet effective now              EDUCATION NEEDS:   Not appropriate for education at this time  Skin:  Skin Assessment: Skin Integrity Issues: Skin Integrity Issues:: Incisions Incisions: Closed: Abdomen; Rectum  Last BM:  2/19 type 6  Height:   Ht Readings from Last 1 Encounters:  10/13/19 5' (1.524 m)    Weight:   Wt Readings from Last 1 Encounters:  10/20/19 59.4 kg    Ideal Body Weight:  45.5 kg  BMI:  Body mass index is 25.58 kg/m.  Estimated Nutritional Needs:   Kcal:  1500-1700  Protein:  75-85  Fluid:  >/= 1.5 L/day   Lajuan Lines, RD, LDN Clinical Nutrition [employer] Telephone [phone] After Hours/Weekend Pager # in [facility]

## 2019-10-20 NOTE — Progress Notes (Signed)
Peripherally Inserted Central Catheter/Midline Placement  The IV Nurse has discussed with the patient and/or persons authorized to consent for the patient, the purpose of this procedure and the potential benefits and risks involved with this procedure.  The benefits include less needle sticks, lab draws from the catheter, and the patient may be discharged home with the catheter. Risks include, but not limited to, infection, bleeding, blood clot (thrombus formation), and puncture of an artery; nerve damage and irregular heartbeat and possibility to perform a PICC exchange if needed/ordered by physician.  Alternatives to this procedure were also discussed.  B[employer] Power PICC patient education guide, fact sheet on infection prevention and patient information card has been provided to patient /or left at bedside.    PICC/Midline Placement Documentation  PICC Double Lumen [id number] PICC Right Basilic 32 cm 0 cm (Active)  Indication for Insertion or Continuance of Line Administration of hyperosmolar/irritating solutions (i.e. TPN, Vancomycin, etc.) 10/20/19 1337  Exposed Catheter (cm) 0 cm 10/20/19 1337  Site Assessment Clean;Dry;Intact 10/20/19 1337  Lumen #1 Status Flushed;Blood return noted;Saline locked 10/20/19 1337  Lumen #2 Status Flushed;Blood return noted;Saline locked 10/20/19 1337  Dressing Type Transparent 10/20/19 1337  Dressing Status Clean;Dry;Intact;Antimicrobial disc in place 10/20/19 1337  Dressing Change Due 10/27/19 10/20/19 1337       Scotty Court 10/20/2019, 1:39 PM

## 2019-10-20 NOTE — Progress Notes (Signed)
PHARMACY - TOTAL PARENTERAL NUTRITION CONSULT NOTE   Indication: Prolonged ileus  Patient Measurements: Height: 5' (152.4 cm) Weight: 130 lb 15.3 oz (59.4 kg) IBW/kg (Calculated) : 45.5 TPN AdjBW (KG): 49.2 Body mass index is 25.58 kg/m. Usual Weight: 60.5 kg on 10/11/19  Assessment: 59 yo F s/pXI ROBOT ASSISTED RECTOPEXY AND SIGMOIDECTOMY on 2/12. Pharmacy to start TPN 2/19 for prolonged ileus POD #7.   Glucose / Insulin: no hx DM, serum glucose WNL Electrolytes: no labs today; labs 2/17 Ca 8.5, corrects to 8.74 with alb 3.7 on 2/10. Slightly low, other lytes WNL; on 2/14 Mag was low at 1.7- given 3 gm IV mag and PO mag 400 qd x 3 days Renal: WNL LFTs / TGs:  Prealbumin / albumin:  Intake / Output; MIVF: no IVF currently GI Imaging: 2/17 & 18 KUB: Bowel dilatation concerning for high-grade small bowel obstruction Surgeries / Procedures: 2/12 XI ROBOT ASSISTED RECTOPEXY AND SIGMOIDECTOMY  Central access: PICC ordered 2/19 for TPN TPN start date: 2/19  Nutritional Goals (per RD recommendation on ): kCal: , Protein: , Fluid:  Goal TPN rate is 75 mL/hr (provides 90 g of protein and 1728 kcals per day)  Current Nutrition:  NPO  Plan:  Start TPN at 40 mL/hr at 1800 and assess tolerance F/u RD goals Goal rate of 75 ml/hr will provide 90 gm protein & 1728 Kcals Electrolytes in TPN: 67mEq/L of Na, 41mEq/L of K, 90mEq/L of Ca, 56mEq/L of Mg, and 35mmol/L of Phos. Cl:Ac ratio 1:1 Add standard MVI on MWF and trace elements to TPN daily Initiate Sensitive q6h SSI and adjust as needed  Monitor TPN labs on Mon/Thurs  Eudelia Bunch, Pharm.D [phone] 10/20/2019 11:22 AM

## 2019-10-20 NOTE — Progress Notes (Signed)
This RN received a call from telesitter monitoring tech at 1026. Monitoring tech wanted to inform RN that patient was drinking something from a cup and may have taken a pill. Patient was informed earlier in the shift by this writer that she was only allowed a few ice chips and thin liquids 30-60 ml at a time. Patient verbalized understanding. Patient also did not have any pills ordered or given to her due to her having a NG. At the time that monitoring tech called writer, patient did have her husband at the bedside. This Probation officer then went to room to ask patient if she had anytime to drink. [place], [place] and [place], [place] were at bedside at the time. Patient stated, "I only had a sip to take a pill". When patient was asked about the pill she then denied that she took a pill and denied saying that she stated that she took a sip of water to take the pill. This Probation officer informed the patient and visitor that it was seen on the camera monitor that the patient put something in her mouth that looked like a pill. Both the patient and visitor denied having and taking any medications other than what is given to the patient by nurses.

## 2019-10-21 ENCOUNTER — Inpatient Hospital Stay (HOSPITAL_COMMUNITY): Payer: Medicaid Other

## 2019-10-21 LAB — COMPREHENSIVE METABOLIC PANEL
ALT: 19 U/L (ref 0–44)
AST: 20 U/L (ref 15–41)
Albumin: 3.2 g/dL — ABNORMAL LOW (ref 3.5–5.0)
Alkaline Phosphatase: 73 U/L (ref 38–126)
Anion gap: 8 (ref 5–15)
BUN: 17 mg/dL (ref 6–20)
CO2: 31 mmol/L (ref 22–32)
Calcium: 9 mg/dL (ref 8.9–10.3)
Chloride: 100 mmol/L (ref 98–111)
Creatinine, Ser: 0.67 mg/dL (ref 0.44–1.00)
GFR calc Af Amer: >60 mL/min (ref >60)
GFR calc non Af Amer: >60 mL/min (ref >60)
Glucose, Bld: 128 mg/dL — ABNORMAL HIGH (ref 70–99)
Potassium: 3.7 mmol/L (ref 3.5–5.1)
Sodium: 139 mmol/L (ref 135–145)
Total Bilirubin: 0.4 mg/dL (ref 0.3–1.2)
Total Protein: 6.5 g/dL (ref 6.5–8.1)

## 2019-10-21 LAB — GLUCOSE, CAPILLARY
Glucose-Capillary: 127 mg/dL — ABNORMAL HIGH (ref 70–99)
Glucose-Capillary: 134 mg/dL — ABNORMAL HIGH (ref 70–99)
Glucose-Capillary: 139 mg/dL — ABNORMAL HIGH (ref 70–99)
Glucose-Capillary: 143 mg/dL — ABNORMAL HIGH (ref 70–99)
Glucose-Capillary: 143 mg/dL — ABNORMAL HIGH (ref 70–99)
Glucose-Capillary: 152 mg/dL — ABNORMAL HIGH (ref 70–99)
Glucose-Capillary: 161 mg/dL — ABNORMAL HIGH (ref 70–99)

## 2019-10-21 LAB — CBC
HCT: 40.3 % (ref 36.0–46.0)
Hemoglobin: 13.5 g/dL (ref 12.0–15.0)
MCH: 30.9 pg (ref 26.0–34.0)
MCHC: 33.5 g/dL (ref 30.0–36.0)
MCV: 92.2 fL (ref 80.0–100.0)
Platelets: 473 10*3/uL — ABNORMAL HIGH (ref 150–400)
RBC: 4.37 MIL/uL (ref 3.87–5.11)
RDW: 14.8 % (ref 11.5–15.5)
WBC: 13.6 10*3/uL — ABNORMAL HIGH (ref 4.0–10.5)
nRBC: 0 % (ref 0.0–0.2)

## 2019-10-21 LAB — DIFFERENTIAL
Abs Immature Granulocytes: 0.11 10*3/uL — ABNORMAL HIGH (ref 0.00–0.07)
Basophils Absolute: 0.1 10*3/uL (ref 0.0–0.1)
Basophils Relative: 0 %
Eosinophils Absolute: 0.1 10*3/uL (ref 0.0–0.5)
Eosinophils Relative: 1 %
Immature Granulocytes: 1 %
Lymphocytes Relative: 11 %
Lymphs Abs: 1.5 10*3/uL (ref 0.7–4.0)
Monocytes Absolute: 1.2 10*3/uL — ABNORMAL HIGH (ref 0.1–1.0)
Monocytes Relative: 9 %
Neutro Abs: 10.6 10*3/uL — ABNORMAL HIGH (ref 1.7–7.7)
Neutrophils Relative %: 78 %

## 2019-10-21 LAB — TRIGLYCERIDES: Triglycerides: 119 mg/dL (ref <150)

## 2019-10-21 LAB — MAGNESIUM: Magnesium: 2.2 mg/dL (ref 1.7–2.4)

## 2019-10-21 LAB — PREALBUMIN: Prealbumin: 14 mg/dL — ABNORMAL LOW (ref 18–38)

## 2019-10-21 LAB — PHOSPHORUS: Phosphorus: 2.8 mg/dL (ref 2.5–4.6)

## 2019-10-21 MED ORDER — IOHEXOL 300 MG/ML  SOLN
25.0000 mL | Freq: Once | INTRAMUSCULAR | Status: AC | PRN
  Administered 2019-10-21: 30 mL

## 2019-10-21 MED ORDER — METOCLOPRAMIDE HCL 5 MG/ML IJ SOLN
5.0000 mg | Freq: Three times a day (TID) | INTRAMUSCULAR | Status: DC
  Administered 2019-10-21 – 2019-10-27 (×18): 5 mg via INTRAVENOUS
  Filled 2019-10-21 (×18): qty 2

## 2019-10-21 MED ORDER — TRAVASOL 10 % IV SOLN
INTRAVENOUS | Status: AC
  Filled 2019-10-21: qty 660

## 2019-10-21 MED ORDER — QUETIAPINE FUMARATE 200 MG PO TABS
800.0000 mg | ORAL_TABLET | Freq: Every day | ORAL | Status: DC
  Administered 2019-10-21 – 2019-10-27 (×7): 800 mg via ORAL
  Filled 2019-10-21 (×4): qty 4
  Filled 2019-10-21: qty 8
  Filled 2019-10-21 (×3): qty 4
  Filled 2019-10-21: qty 8

## 2019-10-21 NOTE — Progress Notes (Signed)
PHARMACY - TOTAL PARENTERAL NUTRITION CONSULT NOTE   Indication: Prolonged ileus  Patient Measurements: Height: 5' (152.4 cm) Weight: 124 lb 12.5 oz (56.6 kg) IBW/kg (Calculated) : 45.5 TPN AdjBW (KG): 49.2 Body mass index is 24.37 kg/m. Usual Weight: 60.5 kg on 10/11/19  Assessment: 59 yo F s/pXI ROBOT ASSISTED RECTOPEXY AND SIGMOIDECTOMY on 2/12. Pharmacy to start TPN 2/19 for prolonged ileus POD #7.   Glucose / Insulin: no hx DM, CBGs at goal < 150, 2 units SSI given since TPN started Electrolytes: WNL, K 3.7 dropped from 4.1 Renal: Scr 0.67 LFTs / TGs: AST/ALT 20/19, TG 119 Prealbumin / albumin: alb 3.2, prealb 14 Intake / Output; MIVF: no IVF currently GI Imaging: 2/17 & 18 KUB: Bowel dilatation concerning for high-grade small bowel obstruction Surgeries / Procedures: 2/12 XI ROBOT ASSISTED RECTOPEXY AND SIGMOIDECTOMY  Central access: PICC ordered 2/19 for TPN TPN start date: 2/19  Nutritional Goals (per RD recommendation on 10/20/19): KCal: 1500-1700, Protein: 78-85g, Fluid: >/= 1.5 L/day  Goal TPN rate is 70 mL/hr (provides 84 g of protein and 1613 kcals per day)  Current Nutrition:  NPO  Plan:   Increase TPN from 40 mL/hr to 55 ml/hr at 1800 and assess tolerance  Electrolytes in TPN: 54mEq/L of Na, 18mEq/L of K, 84mEq/L of Ca, 35mEq/L of Mg, and 12mmol/L of Phos. Cl:Ac ratio 1:1  Add standard MVI on MWF and trace elements to TPN daily  Continue Sensitive q6h SSI and adjust as needed   Monitor TPN labs on Mon/Thurs   Adrian Saran, PharmD, BCPS 10/21/2019 8:29 AM

## 2019-10-21 NOTE — Progress Notes (Signed)
Pt had In & Out cath on  previous shift. Bladder scanned after 8 hrs from last  IN & Out cath ,showed 431ml urine on her bladder. Pt unable to void till 0045. So placed 14 Fr foley cath per MD order. Pt tolerated procedure well. . 400 ml clear urine immediately drained after foley insertion  .  No acute distress

## 2019-10-22 LAB — COMPREHENSIVE METABOLIC PANEL
ALT: 19 U/L (ref 0–44)
AST: 19 U/L (ref 15–41)
Albumin: 3.2 g/dL — ABNORMAL LOW (ref 3.5–5.0)
Alkaline Phosphatase: 85 U/L (ref 38–126)
Anion gap: 9 (ref 5–15)
BUN: 18 mg/dL (ref 6–20)
CO2: 37 mmol/L — ABNORMAL HIGH (ref 22–32)
Calcium: 9.2 mg/dL (ref 8.9–10.3)
Chloride: 94 mmol/L — ABNORMAL LOW (ref 98–111)
Creatinine, Ser: 0.87 mg/dL (ref 0.44–1.00)
GFR calc Af Amer: >60 mL/min (ref >60)
GFR calc non Af Amer: >60 mL/min (ref >60)
Glucose, Bld: 127 mg/dL — ABNORMAL HIGH (ref 70–99)
Potassium: 3.4 mmol/L — ABNORMAL LOW (ref 3.5–5.1)
Sodium: 140 mmol/L (ref 135–145)
Total Bilirubin: 0.7 mg/dL (ref 0.3–1.2)
Total Protein: 6.7 g/dL (ref 6.5–8.1)

## 2019-10-22 LAB — GLUCOSE, CAPILLARY
Glucose-Capillary: 129 mg/dL — ABNORMAL HIGH (ref 70–99)
Glucose-Capillary: 133 mg/dL — ABNORMAL HIGH (ref 70–99)
Glucose-Capillary: 138 mg/dL — ABNORMAL HIGH (ref 70–99)
Glucose-Capillary: 145 mg/dL — ABNORMAL HIGH (ref 70–99)
Glucose-Capillary: 150 mg/dL — ABNORMAL HIGH (ref 70–99)
Glucose-Capillary: 152 mg/dL — ABNORMAL HIGH (ref 70–99)

## 2019-10-22 LAB — MAGNESIUM: Magnesium: 2.3 mg/dL (ref 1.7–2.4)

## 2019-10-22 LAB — PHOSPHORUS: Phosphorus: 4.6 mg/dL (ref 2.5–4.6)

## 2019-10-22 MED ORDER — TRAVASOL 10 % IV SOLN
INTRAVENOUS | Status: AC
  Filled 2019-10-22: qty 840

## 2019-10-22 MED ORDER — POTASSIUM CHLORIDE 10 MEQ/100ML IV SOLN
10.0000 meq | INTRAVENOUS | Status: AC
  Administered 2019-10-22 (×4): 10 meq via INTRAVENOUS
  Filled 2019-10-22 (×4): qty 100

## 2019-10-22 MED ORDER — METOPROLOL TARTRATE 5 MG/5ML IV SOLN
5.0000 mg | INTRAVENOUS | Status: DC | PRN
  Administered 2019-10-22: 01:00:00 5 mg via INTRAVENOUS
  Filled 2019-10-22: qty 5

## 2019-10-22 NOTE — Evaluation (Signed)
Physical Therapy Evaluation Patient Details Name: Jamie Schultz MRN: [id number] DOB: Apr 17, 1961 Today's Date: 10/22/2019   History of Present Illness  Pt admitted with rectal prolapse and is s/p Rectoplexy and Sigmoidectomy.  Pt with hx of COPD, Fibromyalgia, L TSR, Bil TKR, cervical fusion, back surgery and panic attacks  Clinical Impression  Pt admitted as above and presenting with functional mobility limitations 2* generalized weakness, mild ambulatory balance deficits and post op pain.  Pt should progress to dc home with family assist.    Follow Up Recommendations No PT follow up    Equipment Recommendations  None recommended by PT    Recommendations for Other Services       Precautions / Restrictions Precautions Precautions: Fall Restrictions Weight Bearing Restrictions: No      Mobility  Bed Mobility Overal bed mobility: Needs Assistance Bed Mobility: Supine to Sit     Supine to sit: Min guard     General bed mobility comments: use of bed rail and HOB elevated  Transfers Overall transfer level: Needs assistance Equipment used: Rolling walker (2 wheeled) Transfers: Sit to/from Stand Sit to Stand: Min assist         General transfer comment: cues for use of UEs to self assist.  Physical assist to bring wt up and to steady  Ambulation/Gait Ambulation/Gait assistance: Min assist;Min guard Gait Distance (Feet): 450 Feet Assistive device: Rolling walker (2 wheeled) Gait Pattern/deviations: Step-through pattern;Decreased step length - right;Decreased step length - left;Shuffle;Trunk flexed Gait velocity: decr   General Gait Details: cues for posture, pace and position from [employer] Mobility    Modified Rankin (Stroke Patients Only)       Balance Overall balance assessment: Needs assistance Sitting-balance support: No upper extremity supported;Feet supported Sitting balance-Leahy Scale: Good     Standing balance  support: No upper extremity supported Standing balance-Leahy Scale: Fair                               Pertinent Vitals/Pain Pain Assessment: Faces Faces Pain Scale: Hurts little more Pain Location: abdomen Pain Descriptors / Indicators: Grimacing;Guarding Pain Intervention(s): Limited activity within patient's tolerance;Monitored during session;Premedicated before session    Home Living Family/patient expects to be discharged to:: Private residence Living Arrangements: Spouse/significant other Available Help at Discharge: Family Type of Home: House Home Access: Stairs to enter Entrance Stairs-Rails: Right;Left;Can reach both Technical brewer of Steps: 3 Home Layout: One level Home Equipment: None      Prior Function Level of Independence: Independent               Hand Dominance        Extremity/Trunk Assessment   Upper Extremity Assessment Upper Extremity Assessment: Generalized weakness    Lower Extremity Assessment Lower Extremity Assessment: Generalized weakness       Communication   Communication: No difficulties  Cognition Arousal/Alertness: Awake/alert Behavior During Therapy: WFL for tasks assessed/performed Overall Cognitive Status: Within Functional Limits for tasks assessed                                        General Comments      Exercises     Assessment/Plan    PT Assessment Patient needs continued PT services  PT Problem List Decreased strength;Decreased activity  tolerance;Decreased balance;Decreased mobility;Decreased knowledge of use of DME;Pain       PT Treatment Interventions DME instruction;Gait training;Stair training;Functional mobility training;Therapeutic activities;Therapeutic exercise;Patient/family education    PT Goals (Current goals can be found in the Care Plan section)  Acute Rehab PT Goals Patient Stated Goal: Regain IND PT Goal Formulation: With patient Time For Goal  Achievement: 11/05/19 Potential to Achieve Goals: Good    Frequency Min 3X/week   Barriers to discharge        Co-evaluation               AM-PAC PT "6 Clicks" Mobility  Outcome Measure Help needed turning from your back to your side while in a flat bed without using bedrails?: A Little Help needed moving from lying on your back to sitting on the side of a flat bed without using bedrails?: A Little Help needed moving to and from a bed to a chair (including a wheelchair)?: A Little Help needed standing up from a chair using your arms (e.g., wheelchair or bedside chair)?: A Little Help needed to walk in hospital room?: A Little Help needed climbing 3-5 steps with a railing? : A Little 6 Click Score: 18    End of Session Equipment Utilized During Treatment: Oxygen Activity Tolerance: Patient tolerated treatment well Patient left: in chair;with call bell/phone within reach;with chair alarm set Nurse Communication: Mobility status PT Visit Diagnosis: Unsteadiness on feet (R26.81);Difficulty in walking, not elsewhere classified (R26.2)    Time: 0940-1005 PT Time Calculation (min) (ACUTE ONLY): 25 min   Charges:   PT Evaluation $PT Eval Low Complexity: 1 Low PT Treatments $Gait Training: 8-22 mins        Debe Coder PT Acute Rehabilitation Services Pager [phone] Office [phone]   Corgan Mormile 10/22/2019, 12:14 PM

## 2019-10-22 NOTE — Progress Notes (Signed)
Paged Dr. Marcello Moores to make aware that patient BP was high (174/106) even after pain medication received earlier and if something can be ordered. Verbal order to give metoprolol 5mg  IV PRN Q4 for systolic bp > 0000000. Also, expressed concerned over taken out JP drain. Dayshift nurse expressed concern to charge nurse and me about patient still having severe abdominal pain and patient still draining a lot of fluid from her NG tube and JP drain.   Dr. Marcello Moores made aware and has said that other nurses have expressed their concern but she put the order in and to take it out. 50 ml  From her JP and 900 ml from her NG tube had been measured at this point. Talk to the nursing supervisor and charge nurse about my concerns and what happen and was advised to leave the drain if I did not feel comfortable taking it out at the time. Charge nurse says she was talk to dayshift charge nurse to address this situation and go from there. Patient still NPO and given pain medication as needed per order for comfort.

## 2019-10-22 NOTE — Progress Notes (Addendum)
9 Days Post-Op Robotic Rectopexy and sigmoid resection  Subjective/Chief Complaint:  Pain and distention better this am.  NG in place.  Passing flatus  Objective: Vital signs in last 24 hours: Temp:  [98.2 F (36.8 C)-98.5 F (36.9 C)] 98.5 F (36.9 C) (02/21 0339) Pulse Rate:  [76-80] 76 (02/21 0339) Resp:  [18] 18 (02/21 0339) BP: (125-174)/(91-106) 125/91 (02/21 0339) SpO2:  [94 %-95 %] 95 % (02/21 0339) Weight:  [51.6 kg] 51.6 kg (02/21 0500) Last BM Date: 10/20/19  Intake/Output from previous day: 02/20 0701 - 02/21 0700 In: 0  Out: 3600 [Urine:1200; Emesis/NG output:2250; Drains:150] Intake/Output this shift: No intake/output data recorded.  General appearance: alert, cooperative Resp: Unlabored respiration GI: Soft, less distended,appropriately tender no peritonitis.  Incisions are clean dry and intact, drain output serous Skin: Skin color, texture, turgor normal. No rashes or lesions  Lab Results:  Recent Labs    10/21/19 0345  WBC 13.6*  HGB 13.5  HCT 40.3  PLT 473*   BMET Recent Labs    10/21/19 0345 10/22/19 0345  NA 139 140  K 3.7 3.4*  CL 100 94*  CO2 31 37*  GLUCOSE 128* 127*  BUN 17 18  CREATININE 0.67 0.87  CALCIUM 9.0 9.2   PT/INR No results for input(s): LABPROT, INR in the last 72 hours. ABG No results for input(s): PHART, HCO3 in the last 72 hours.  Invalid input(s): PCO2, PO2  Studies/Results: CT ABDOMEN PELVIS WO CONTRAST  Result Date: 10/21/2019 CLINICAL DATA:  Abdominal pain. The patient is status post sigmoidectomy. EXAM: CT ABDOMEN AND PELVIS WITHOUT CONTRAST TECHNIQUE: Multidetector CT imaging of the abdomen and pelvis was performed following the standard protocol without IV contrast. COMPARISON:  Abdominal radiograph dated 10/19/2019 and CT pelvis dated 06/13/2014. FINDINGS: Lower chest: There is moderate left lower lobe and mild right lower lobe atelectasis. Hepatobiliary: No focal liver abnormality is seen. No gallstones,  gallbladder wall thickening, or biliary dilatation. Pancreas: Unremarkable. No pancreatic ductal dilatation or surrounding inflammatory changes. Spleen: Normal in size without focal abnormality. Adrenals/Urinary Tract: Adrenal glands are unremarkable. Other than bilateral renal cysts measuring up to 2 cm, the kidneys are normal, without renal calculi, focal lesion, or hydronephrosis. Bladder is decompressed with a Foley catheter in place. Stomach/Bowel: Stomach is within normal limits. An enteric tube terminates in the stomach. The patient is status post sigmoidectomy and rectopexy with surgical anastomosis in the upper pelvis. A rectal tube is in place with enteric contrast extending from the rectum to the transverse colon. There is no contrast extravasation from the bowel lumen to suggest anastomotic leak. A surgical drain is looped through the mid abdomen and left abdomen. A few mildly dilated loops of small bowel are seen in the mid abdomen without a definite discrete transition point to suggest high-grade bowel obstruction. No pericecal inflammatory changes are noted to suggest acute appendicitis. No evidence of bowel wall thickening, distention, or inflammatory changes. Vascular/Lymphatic: Aortic atherosclerosis. No enlarged abdominal or pelvic lymph nodes. Reproductive: Status post hysterectomy. No adnexal masses. Other: No abdominal wall hernia. A few locules of gas in the subcutaneous fat of the left lower quadrant are likely postoperative. No abdominopelvic ascites. Musculoskeletal: Fixation hardware is seen at L4-L5. IMPRESSION: 1. Status post sigmoidectomy and rectopexy with surgical anastomosis in the upper pelvis. No contrast extravasation from the bowel lumen to suggest anastomotic leak. 2. A few mildly dilated loops of small bowel are seen in the mid abdomen without a definite discrete transition point to suggest  high-grade bowel obstruction. This favored to reflect postoperative ileus. 3. Moderate  left lower lobe and mild right lower lobe atelectasis. Aortic Atherosclerosis (ICD10-I70.0). Electronically Signed   By: Zerita Boers M.D.   On: 10/21/2019 12:59   [place] EKG SITE RITE  Result Date: 10/20/2019 If Site Rite image not attached, placement could not be confirmed due to current cardiac rhythm.   Anti-infectives: Anti-infectives (From admission, onward)   Start     Dose/Rate Route Frequency Ordered Stop   10/13/19 2200  clindamycin (CLEOCIN) IVPB 900 mg     900 mg 100 mL/hr over 30 Minutes Intravenous Every 8 hours 10/13/19 2006 10/13/19 2329   10/13/19 1300  clindamycin (CLEOCIN) IVPB 900 mg  Status:  Discontinued     900 mg 100 mL/hr over 30 Minutes Intravenous  Once 10/13/19 1252 10/13/19 1959   10/13/19 1300  gentamicin (GARAMYCIN) 260 mg in dextrose 5 % 100 mL IVPB  Status:  Discontinued     5 mg/kg  51.4 kg (Adjusted) 106.5 mL/hr over 60 Minutes Intravenous  Once 10/13/19 1252 10/13/19 1959      Assessment/Plan: S/p robotic rectopexy and resection 10/13/19.  Continue JP to bulb suction. Wbc elevated, will get CT with rectal contrast to r/o leak Multimodal pain control Mobilize, pulmonary toilet NG- cont.  NP O Cont PICC and TPN Ambulate     LOS: 9 days    [name] [id number]

## 2019-10-22 NOTE — Progress Notes (Addendum)
PHARMACY - TOTAL PARENTERAL NUTRITION CONSULT NOTE   Indication: Prolonged ileus  Patient Measurements: Height: 5' (152.4 cm) Weight: 113 lb 12.1 oz (51.6 kg) IBW/kg (Calculated) : 45.5 TPN AdjBW (KG): 49.2 Body mass index is 22.22 kg/m. Usual Weight: 60.5 kg on 10/11/19  Assessment: 59 yo F s/pXI ROBOT ASSISTED RECTOPEXY AND SIGMOIDECTOMY on 2/12. Pharmacy to start TPN 2/19 for prolonged ileus POD #7.   Glucose / Insulin: no hx DM, CBGs at goal < 150 except for readings of 152 and 161, 9 units SSI given last 24hr Electrolytes: WNL except K 3.4 still trending down - will order replacement. CO2 now above normal range and CL below.  Renal: Scr 0.87 stable LFTs / TGs: AST/ALT 19/19, TG 119 Prealbumin / albumin: alb 3.2, prealb 14 Intake / Output; MIVF: no IVF currently GI Imaging: 2/17 & 18 KUB: Bowel dilatation concerning for high-grade small bowel obstruction Surgeries / Procedures: 2/12 XI ROBOT ASSISTED RECTOPEXY AND SIGMOIDECTOMY  Central access: PICC ordered 2/19 for TPN TPN start date: 2/19  Nutritional Goals (per RD recommendation on 10/20/19): KCal: 1500-1700, Protein: 78-85g, Fluid: >/= 1.5 L/day  Goal TPN rate is 70 mL/hr (provides 84 g of protein and 1613 kcals per day)  Current Nutrition:  NPO  Plan:  Now:  To give IV KCL 64mEq x 4 doses  At 1800:  Increase TPN from 55 mL/hr to goal 70 ml/hr at 1800 and assess tolerance  Electrolytes in TPN: 56mEq/L of Na, 40mEq/L of K, 68mEq/L of Ca, 66mEq/L of Mg, and 8mmol/L of Phos. Cl:Ac ratio: max Cl  Add standard MVI on MWF and trace elements to TPN daily  Continue Sensitive q4h SSI and adjust as needed   Monitor TPN labs on Mon/Thurs   Adrian Saran, PharmD, BCPS 10/22/2019 8:24 AM

## 2019-10-22 NOTE — Progress Notes (Addendum)
9 Days Post-Op Robotic Rectopexy and sigmoid resection  Subjective/Chief Complaint:  Pain and distention worse this am.  No nausea, passing flatus.  Ambulating some, Hungry  Objective: Vital signs in last 24 hours: Temp:  [98.2 F (36.8 C)-98.5 F (36.9 C)] 98.5 F (36.9 C) (02/21 0339) Pulse Rate:  [76-80] 76 (02/21 0339) Resp:  [18] 18 (02/21 0339) BP: (125-174)/(91-106) 125/91 (02/21 0339) SpO2:  [94 %-95 %] 95 % (02/21 0339) Weight:  [51.6 kg] 51.6 kg (02/21 0500) Last BM Date: 10/20/19  Intake/Output from previous day: 02/20 0701 - 02/21 0700 In: 0  Out: 3600 [Urine:1200; Emesis/NG output:2250; Drains:150] Intake/Output this shift: No intake/output data recorded.  General appearance: alert, cooperative Resp: Unlabored respiration GI: Soft, less distended,appropriately tender no peritonitis.  Incisions are clean dry and intact, drain output serous Skin: Skin color, texture, turgor normal. No rashes or lesions  Lab Results:  Recent Labs    10/21/19 0345  WBC 13.6*  HGB 13.5  HCT 40.3  PLT 473*   BMET Recent Labs    10/21/19 0345 10/22/19 0345  NA 139 140  K 3.7 3.4*  CL 100 94*  CO2 31 37*  GLUCOSE 128* 127*  BUN 17 18  CREATININE 0.67 0.87  CALCIUM 9.0 9.2   PT/INR No results for input(s): LABPROT, INR in the last 72 hours. ABG No results for input(s): PHART, HCO3 in the last 72 hours.  Invalid input(s): PCO2, PO2  Studies/Results: CT ABDOMEN PELVIS WO CONTRAST  Result Date: 10/21/2019 CLINICAL DATA:  Abdominal pain. The patient is status post sigmoidectomy. EXAM: CT ABDOMEN AND PELVIS WITHOUT CONTRAST TECHNIQUE: Multidetector CT imaging of the abdomen and pelvis was performed following the standard protocol without IV contrast. COMPARISON:  Abdominal radiograph dated 10/19/2019 and CT pelvis dated 06/13/2014. FINDINGS: Lower chest: There is moderate left lower lobe and mild right lower lobe atelectasis. Hepatobiliary: No focal liver abnormality is  seen. No gallstones, gallbladder wall thickening, or biliary dilatation. Pancreas: Unremarkable. No pancreatic ductal dilatation or surrounding inflammatory changes. Spleen: Normal in size without focal abnormality. Adrenals/Urinary Tract: Adrenal glands are unremarkable. Other than bilateral renal cysts measuring up to 2 cm, the kidneys are normal, without renal calculi, focal lesion, or hydronephrosis. Bladder is decompressed with a Foley catheter in place. Stomach/Bowel: Stomach is within normal limits. An enteric tube terminates in the stomach. The patient is status post sigmoidectomy and rectopexy with surgical anastomosis in the upper pelvis. A rectal tube is in place with enteric contrast extending from the rectum to the transverse colon. There is no contrast extravasation from the bowel lumen to suggest anastomotic leak. A surgical drain is looped through the mid abdomen and left abdomen. A few mildly dilated loops of small bowel are seen in the mid abdomen without a definite discrete transition point to suggest high-grade bowel obstruction. No pericecal inflammatory changes are noted to suggest acute appendicitis. No evidence of bowel wall thickening, distention, or inflammatory changes. Vascular/Lymphatic: Aortic atherosclerosis. No enlarged abdominal or pelvic lymph nodes. Reproductive: Status post hysterectomy. No adnexal masses. Other: No abdominal wall hernia. A few locules of gas in the subcutaneous fat of the left lower quadrant are likely postoperative. No abdominopelvic ascites. Musculoskeletal: Fixation hardware is seen at L4-L5. IMPRESSION: 1. Status post sigmoidectomy and rectopexy with surgical anastomosis in the upper pelvis. No contrast extravasation from the bowel lumen to suggest anastomotic leak. 2. A few mildly dilated loops of small bowel are seen in the mid abdomen without a definite discrete transition point  to suggest high-grade bowel obstruction. This favored to reflect postoperative  ileus. 3. Moderate left lower lobe and mild right lower lobe atelectasis. Aortic Atherosclerosis (ICD10-I70.0). Electronically Signed   By: Zerita Boers M.D.   On: 10/21/2019 12:59   [place] EKG SITE RITE  Result Date: 10/20/2019 If Site Rite image not attached, placement could not be confirmed due to current cardiac rhythm.   Anti-infectives: Anti-infectives (From admission, onward)   Start     Dose/Rate Route Frequency Ordered Stop   10/13/19 2200  clindamycin (CLEOCIN) IVPB 900 mg     900 mg 100 mL/hr over 30 Minutes Intravenous Every 8 hours 10/13/19 2006 10/13/19 2329   10/13/19 1300  clindamycin (CLEOCIN) IVPB 900 mg  Status:  Discontinued     900 mg 100 mL/hr over 30 Minutes Intravenous  Once 10/13/19 1252 10/13/19 1959   10/13/19 1300  gentamicin (GARAMYCIN) 260 mg in dextrose 5 % 100 mL IVPB  Status:  Discontinued     5 mg/kg  51.4 kg (Adjusted) 106.5 mL/hr over 60 Minutes Intravenous  Once 10/13/19 1252 10/13/19 1959      Assessment/Plan: S/p robotic rectopexy and resection 10/13/19.  Continue JP to bulb suction. Multimodal pain control Mobilize, pulmonary toilet NG placed overnight.  NPO Place PICC and start TPN Ambulate     LOS: 9 days    [name] [id number]

## 2019-10-22 NOTE — Progress Notes (Signed)
9 Days Post-Op Robotic Rectopexy and sigmoid resection  Subjective/Chief Complaint:   distention better this am.  NG in place.  Passing flatus.  No BM.  Didn't ambulate yesterday  Objective: Vital signs in last 24 hours: Temp:  [98.2 F (36.8 C)-98.5 F (36.9 C)] 98.5 F (36.9 C) (02/21 0339) Pulse Rate:  [76-80] 76 (02/21 0339) Resp:  [18] 18 (02/21 0339) BP: (125-174)/(91-106) 125/91 (02/21 0339) SpO2:  [94 %-95 %] 95 % (02/21 0339) Weight:  [51.6 kg] 51.6 kg (02/21 0500) Last BM Date: 10/20/19  Intake/Output from previous day: 02/20 0701 - 02/21 0700 In: 0  Out: 3600 [Urine:1200; Emesis/NG output:2250; Drains:150] Intake/Output this shift: No intake/output data recorded.  General appearance: alert, cooperative Resp: Unlabored respiration GI: Soft, less distended,appropriately tender no peritonitis.  Incisions are clean dry and intact, drain output serous Skin: Skin color, texture, turgor normal. No rashes or lesions  Lab Results:  Recent Labs    10/21/19 0345  WBC 13.6*  HGB 13.5  HCT 40.3  PLT 473*   BMET Recent Labs    10/21/19 0345 10/22/19 0345  NA 139 140  K 3.7 3.4*  CL 100 94*  CO2 31 37*  GLUCOSE 128* 127*  BUN 17 18  CREATININE 0.67 0.87  CALCIUM 9.0 9.2   PT/INR No results for input(s): LABPROT, INR in the last 72 hours. ABG No results for input(s): PHART, HCO3 in the last 72 hours.  Invalid input(s): PCO2, PO2  Studies/Results: CT ABDOMEN PELVIS WO CONTRAST  Result Date: 10/21/2019 CLINICAL DATA:  Abdominal pain. The patient is status post sigmoidectomy. EXAM: CT ABDOMEN AND PELVIS WITHOUT CONTRAST TECHNIQUE: Multidetector CT imaging of the abdomen and pelvis was performed following the standard protocol without IV contrast. COMPARISON:  Abdominal radiograph dated 10/19/2019 and CT pelvis dated 06/13/2014. FINDINGS: Lower chest: There is moderate left lower lobe and mild right lower lobe atelectasis. Hepatobiliary: No focal liver  abnormality is seen. No gallstones, gallbladder wall thickening, or biliary dilatation. Pancreas: Unremarkable. No pancreatic ductal dilatation or surrounding inflammatory changes. Spleen: Normal in size without focal abnormality. Adrenals/Urinary Tract: Adrenal glands are unremarkable. Other than bilateral renal cysts measuring up to 2 cm, the kidneys are normal, without renal calculi, focal lesion, or hydronephrosis. Bladder is decompressed with a Foley catheter in place. Stomach/Bowel: Stomach is within normal limits. An enteric tube terminates in the stomach. The patient is status post sigmoidectomy and rectopexy with surgical anastomosis in the upper pelvis. A rectal tube is in place with enteric contrast extending from the rectum to the transverse colon. There is no contrast extravasation from the bowel lumen to suggest anastomotic leak. A surgical drain is looped through the mid abdomen and left abdomen. A few mildly dilated loops of small bowel are seen in the mid abdomen without a definite discrete transition point to suggest high-grade bowel obstruction. No pericecal inflammatory changes are noted to suggest acute appendicitis. No evidence of bowel wall thickening, distention, or inflammatory changes. Vascular/Lymphatic: Aortic atherosclerosis. No enlarged abdominal or pelvic lymph nodes. Reproductive: Status post hysterectomy. No adnexal masses. Other: No abdominal wall hernia. A few locules of gas in the subcutaneous fat of the left lower quadrant are likely postoperative. No abdominopelvic ascites. Musculoskeletal: Fixation hardware is seen at L4-L5. IMPRESSION: 1. Status post sigmoidectomy and rectopexy with surgical anastomosis in the upper pelvis. No contrast extravasation from the bowel lumen to suggest anastomotic leak. 2. A few mildly dilated loops of small bowel are seen in the mid abdomen without a  definite discrete transition point to suggest high-grade bowel obstruction. This favored to  reflect postoperative ileus. 3. Moderate left lower lobe and mild right lower lobe atelectasis. Aortic Atherosclerosis (ICD10-I70.0). Electronically Signed   By: Zerita Boers M.D.   On: 10/21/2019 12:59   [place] EKG SITE RITE  Result Date: 10/20/2019 If Site Rite image not attached, placement could not be confirmed due to current cardiac rhythm.   Anti-infectives: Anti-infectives (From admission, onward)   Start     Dose/Rate Route Frequency Ordered Stop   10/13/19 2200  clindamycin (CLEOCIN) IVPB 900 mg     900 mg 100 mL/hr over 30 Minutes Intravenous Every 8 hours 10/13/19 2006 10/13/19 2329   10/13/19 1300  clindamycin (CLEOCIN) IVPB 900 mg  Status:  Discontinued     900 mg 100 mL/hr over 30 Minutes Intravenous  Once 10/13/19 1252 10/13/19 1959   10/13/19 1300  gentamicin (GARAMYCIN) 260 mg in dextrose 5 % 100 mL IVPB  Status:  Discontinued     5 mg/kg  51.4 kg (Adjusted) 106.5 mL/hr over 60 Minutes Intravenous  Once 10/13/19 1252 10/13/19 1959      Assessment/Plan: S/p robotic rectopexy and resection 10/13/19.  CT reviewed: no leak, ileus noted.  Jp out of place D/c JP Recheck wbc in AM Multimodal pain control Mobilize, pulmonary toilet NG- cont.  NPO Cont PICC and TPN Ambulate     LOS: 9 days    [name] [id number]

## 2019-10-22 NOTE — Progress Notes (Signed)
Upon entry to room for assessment, patient asleep and arouses easily and immediately states that she feels like she's going to have a panic attack. Pt easily redirected and distracted and fell back asleep as I was exiting the room.

## 2019-10-23 ENCOUNTER — Inpatient Hospital Stay (HOSPITAL_COMMUNITY): Payer: Medicaid Other

## 2019-10-23 LAB — DIFFERENTIAL
Abs Immature Granulocytes: 0.13 10*3/uL — ABNORMAL HIGH (ref 0.00–0.07)
Basophils Absolute: 0.1 10*3/uL (ref 0.0–0.1)
Basophils Relative: 1 %
Eosinophils Absolute: 0.4 10*3/uL (ref 0.0–0.5)
Eosinophils Relative: 3 %
Immature Granulocytes: 1 %
Lymphocytes Relative: 18 %
Lymphs Abs: 2.1 10*3/uL (ref 0.7–4.0)
Monocytes Absolute: 1 10*3/uL (ref 0.1–1.0)
Monocytes Relative: 8 %
Neutro Abs: 8.2 10*3/uL — ABNORMAL HIGH (ref 1.7–7.7)
Neutrophils Relative %: 69 %

## 2019-10-23 LAB — COMPREHENSIVE METABOLIC PANEL
ALT: 61 U/L — ABNORMAL HIGH (ref 0–44)
AST: 54 U/L — ABNORMAL HIGH (ref 15–41)
Albumin: 2.9 g/dL — ABNORMAL LOW (ref 3.5–5.0)
Alkaline Phosphatase: 103 U/L (ref 38–126)
Anion gap: 9 (ref 5–15)
BUN: 23 mg/dL — ABNORMAL HIGH (ref 6–20)
CO2: 35 mmol/L — ABNORMAL HIGH (ref 22–32)
Calcium: 8.9 mg/dL (ref 8.9–10.3)
Chloride: 94 mmol/L — ABNORMAL LOW (ref 98–111)
Creatinine, Ser: 0.86 mg/dL (ref 0.44–1.00)
GFR calc Af Amer: >60 mL/min (ref >60)
GFR calc non Af Amer: >60 mL/min (ref >60)
Glucose, Bld: 140 mg/dL — ABNORMAL HIGH (ref 70–99)
Potassium: 3.9 mmol/L (ref 3.5–5.1)
Sodium: 138 mmol/L (ref 135–145)
Total Bilirubin: 0.4 mg/dL (ref 0.3–1.2)
Total Protein: 6.2 g/dL — ABNORMAL LOW (ref 6.5–8.1)

## 2019-10-23 LAB — GLUCOSE, CAPILLARY
Glucose-Capillary: 138 mg/dL — ABNORMAL HIGH (ref 70–99)
Glucose-Capillary: 150 mg/dL — ABNORMAL HIGH (ref 70–99)
Glucose-Capillary: 110 mg/dL — ABNORMAL HIGH (ref 70–99)
Glucose-Capillary: 116 mg/dL — ABNORMAL HIGH (ref 70–99)

## 2019-10-23 LAB — CBC
HCT: 40.6 % (ref 36.0–46.0)
Hemoglobin: 13 g/dL (ref 12.0–15.0)
MCH: 30 pg (ref 26.0–34.0)
MCHC: 32 g/dL (ref 30.0–36.0)
MCV: 93.8 fL (ref 80.0–100.0)
Platelets: 434 10*3/uL — ABNORMAL HIGH (ref 150–400)
RBC: 4.33 MIL/uL (ref 3.87–5.11)
RDW: 14.7 % (ref 11.5–15.5)
WBC: 11.8 10*3/uL — ABNORMAL HIGH (ref 4.0–10.5)
nRBC: 0 % (ref 0.0–0.2)

## 2019-10-23 LAB — MAGNESIUM: Magnesium: 2.2 mg/dL (ref 1.7–2.4)

## 2019-10-23 LAB — TRIGLYCERIDES: Triglycerides: 111 mg/dL (ref <150)

## 2019-10-23 LAB — PREALBUMIN: Prealbumin: 18.5 mg/dL (ref 18–38)

## 2019-10-23 LAB — PHOSPHORUS: Phosphorus: 4.4 mg/dL (ref 2.5–4.6)

## 2019-10-23 MED ORDER — POTASSIUM CHLORIDE 10 MEQ/50ML IV SOLN
10.0000 meq | INTRAVENOUS | Status: AC
  Administered 2019-10-23 (×2): 10 meq via INTRAVENOUS
  Filled 2019-10-23 (×2): qty 50

## 2019-10-23 MED ORDER — TRAVASOL 10 % IV SOLN
INTRAVENOUS | Status: AC
  Filled 2019-10-23: qty 840

## 2019-10-23 NOTE — Progress Notes (Signed)
Physical Therapy Treatment Patient Details Name: Jamie Schultz MRN: [id number] DOB: 1961/05/25 Today's Date: 10/23/2019    History of Present Illness Pt admitted with rectal prolapse and is s/p Rectoplexy and Sigmoidectomy.  Pt with hx of COPD, Fibromyalgia, L TSR, Bil TKR, cervical fusion, back surgery and panic attacks    PT Comments    Pt ambulated good distance in hallway with spouse.  Pt eager to get better and agreeable to ambulate often during acute stay.   Follow Up Recommendations  No PT follow up     Equipment Recommendations  None recommended by PT    Recommendations for Other Services       Precautions / Restrictions Precautions Precautions: Fall Precaution Comments: NG tube    Mobility  Bed Mobility Overal bed mobility: Needs Assistance Bed Mobility: Supine to Sit;Sit to Supine     Supine to sit: Supervision Sit to supine: Supervision   General bed mobility comments: supervision for lines  Transfers Overall transfer level: Needs assistance Equipment used: None Transfers: Sit to/from Stand Sit to Stand: Min guard         General transfer comment: min/guard for safety/lines  Ambulation/Gait Ambulation/Gait assistance: Min guard Gait Distance (Feet): 350 Feet Assistive device: None Gait Pattern/deviations: Step-through pattern;Decreased stride length     General Gait Details: pt held spouse's arm however also ambulated short distances without UE support, tolerated distance well   Stairs             Wheelchair Mobility    Modified Rankin (Stroke Patients Only)       Balance Overall balance assessment: Needs assistance         Standing balance support: No upper extremity supported Standing balance-Leahy Scale: Fair                              Cognition Arousal/Alertness: Awake/alert Behavior During Therapy: WFL for tasks assessed/performed Overall Cognitive Status: Within Functional Limits for tasks  assessed                                        Exercises      General Comments        Pertinent Vitals/Pain Pain Assessment: Faces Faces Pain Scale: Hurts a little bit Pain Descriptors / Indicators: Grimacing;Guarding Pain Intervention(s): Monitored during session;Repositioned    Home Living                      Prior Function            PT Goals (current goals can now be found in the care plan section) Progress towards PT goals: Progressing toward goals    Frequency    Min 3X/week      PT Plan Current plan remains appropriate    Co-evaluation              AM-PAC PT "6 Clicks" Mobility   Outcome Measure  Help needed turning from your back to your side while in a flat bed without using bedrails?: A Little Help needed moving from lying on your back to sitting on the side of a flat bed without using bedrails?: A Little Help needed moving to and from a bed to a chair (including a wheelchair)?: A Little Help needed standing up from a chair using your arms (e.g., wheelchair or bedside chair)?:  A Little Help needed to walk in hospital room?: A Little Help needed climbing 3-5 steps with a railing? : A Little 6 Click Score: 18    End of Session Equipment Utilized During Treatment: Oxygen Activity Tolerance: Patient tolerated treatment well Patient left: in bed;with call bell/phone within reach;with family/visitor present   PT Visit Diagnosis: Difficulty in walking, not elsewhere classified (R26.2)     Time: [id number] PT Time Calculation (min) (ACUTE ONLY): 11 min  Charges:  $Gait Training: 8-22 mins                    Arlyce Dice, DPT Acute Rehabilitation Services Office: [phone]  Trena Platt 10/23/2019, 4:03 PM

## 2019-10-23 NOTE — Progress Notes (Signed)
Patient has only voided small amount since foley was removed at 1200. Per MD on call if patient has not voided by 2000 tonight she may require in/out cath.

## 2019-10-23 NOTE — Progress Notes (Signed)
PHARMACY - TOTAL PARENTERAL NUTRITION CONSULT NOTE   Indication: Prolonged ileus  Patient Measurements: Height: 5' (152.4 cm) Weight: 117 lb 1 oz (53.1 kg) IBW/kg (Calculated) : 45.5 TPN AdjBW (KG): 49.2 Body mass index is 22.86 kg/m. Usual Weight: 60.5 kg on 10/11/19  Assessment: 59 yo F s/pXI ROBOT ASSISTED RECTOPEXY AND SIGMOIDECTOMY on 2/12. Pharmacy to start TPN 2/19 for prolonged ileus POD #7.   Glucose / Insulin: no hx DM, CBGs 133-152, goal <150;  7 units SSI given last 24hr Electrolytes: K 3.9 after 4 runs yesterday. CO2 above normal range and CL below.  Renal: Scr WNL LFTs / TGs: AST/ALT slightly elevated, TG 111 Prealbumin / albumin: alb 2.9, prealb 14 up to 18.5 2/22 WNL Intake / Output; MIVF: no IVF currently; NGO 2400 mls; relgan 5 IV q8 added 2/20  GI Imaging: 2/17 & 18 KUB: Bowel dilatation concerning for high-grade small bowel obstruction 2/22 KUB: Persistent air-filled loops of small bowel, likely reflecting postoperative ileus Surgeries / Procedures: 2/12 XI ROBOT ASSISTED RECTOPEXY AND SIGMOIDECTOMY  Central access: PICC ordered 2/19 for TPN TPN start date: 2/19  Nutritional Goals (per RD recommendation on 10/20/19): KCal: 1500-1700, Protein: 78-85g, Fluid: >/= 1.5 L/day  Goal TPN rate is 70 mL/hr (provides 84 g of protein and 1613 kcals per day)  Current Nutrition:  NPO  TPN at 70 ml/hr  Plan:  Now:  IV KCL 64mEq x 2 doses  At 1800:  continue TPN at goal rate 70 ml/hr   Electrolytes in TPN: 44mEq/L of Na, 69mEq/L of K, 22mEq/L of Ca, 56mEq/L of Mg, and 23mmol/L of Phos. Cl:Ac ratio: max Cl  Add standard MVI on MWF and trace elements to TPN daily  Continue Sensitive q4h SSI and adjust as needed   Monitor TPN labs on Mon/Thurs, BMET in AM  Eudelia Bunch, Pharm.D [phone] 10/23/2019 7:31 AM

## 2019-10-23 NOTE — Progress Notes (Signed)
10 Days Post-Op Robotic Rectopexy and sigmoid resection  Subjective/Chief Complaint:   distention better this am.  NG in place.  No BM.  Ambulated in hall 3 times  Objective: Vital signs in last 24 hours: Temp:  [98.3 F (36.8 C)-99.2 F (37.3 C)] 98.3 F (36.8 C) (02/22 0408) Pulse Rate:  [79-92] 92 (02/22 0408) Resp:  [14-17] 17 (02/22 0408) BP: (101-144)/(60-110) 101/60 (02/22 0408) SpO2:  [92 %-95 %] 92 % (02/22 0408) Weight:  [53.1 kg] 53.1 kg (02/22 0408) Last BM Date: 10/20/19  Intake/Output from previous day: 02/21 0701 - 02/22 0700 In: 1670.8 [I.V.:1270.8; IV Piggyback:400] Out: 2875 [Urine:450; Emesis/NG output:2400; Drains:25] Intake/Output this shift: No intake/output data recorded.  General appearance: alert, cooperative Resp: Unlabored respiration GI: Soft, less distended,appropriately tender no peritonitis.  Incisions are clean dry and intact Skin: Skin color, texture, turgor normal. No rashes or lesions  Lab Results:  Recent Labs    10/21/19 0345 10/23/19 0346  WBC 13.6* 11.8*  HGB 13.5 13.0  HCT 40.3 40.6  PLT 473* 434*   BMET Recent Labs    10/22/19 0345 10/23/19 0346  NA 140 138  K 3.4* 3.9  CL 94* 94*  CO2 37* 35*  GLUCOSE 127* 140*  BUN 18 23*  CREATININE 0.87 0.86  CALCIUM 9.2 8.9   PT/INR No results for input(s): LABPROT, INR in the last 72 hours. ABG No results for input(s): PHART, HCO3 in the last 72 hours.  Invalid input(s): PCO2, PO2  Studies/Results: CT ABDOMEN PELVIS WO CONTRAST  Result Date: 10/21/2019 CLINICAL DATA:  Abdominal pain. The patient is status post sigmoidectomy. EXAM: CT ABDOMEN AND PELVIS WITHOUT CONTRAST TECHNIQUE: Multidetector CT imaging of the abdomen and pelvis was performed following the standard protocol without IV contrast. COMPARISON:  Abdominal radiograph dated 10/19/2019 and CT pelvis dated 06/13/2014. FINDINGS: Lower chest: There is moderate left lower lobe and mild right lower lobe atelectasis.  Hepatobiliary: No focal liver abnormality is seen. No gallstones, gallbladder wall thickening, or biliary dilatation. Pancreas: Unremarkable. No pancreatic ductal dilatation or surrounding inflammatory changes. Spleen: Normal in size without focal abnormality. Adrenals/Urinary Tract: Adrenal glands are unremarkable. Other than bilateral renal cysts measuring up to 2 cm, the kidneys are normal, without renal calculi, focal lesion, or hydronephrosis. Bladder is decompressed with a Foley catheter in place. Stomach/Bowel: Stomach is within normal limits. An enteric tube terminates in the stomach. The patient is status post sigmoidectomy and rectopexy with surgical anastomosis in the upper pelvis. A rectal tube is in place with enteric contrast extending from the rectum to the transverse colon. There is no contrast extravasation from the bowel lumen to suggest anastomotic leak. A surgical drain is looped through the mid abdomen and left abdomen. A few mildly dilated loops of small bowel are seen in the mid abdomen without a definite discrete transition point to suggest high-grade bowel obstruction. No pericecal inflammatory changes are noted to suggest acute appendicitis. No evidence of bowel wall thickening, distention, or inflammatory changes. Vascular/Lymphatic: Aortic atherosclerosis. No enlarged abdominal or pelvic lymph nodes. Reproductive: Status post hysterectomy. No adnexal masses. Other: No abdominal wall hernia. A few locules of gas in the subcutaneous fat of the left lower quadrant are likely postoperative. No abdominopelvic ascites. Musculoskeletal: Fixation hardware is seen at L4-L5. IMPRESSION: 1. Status post sigmoidectomy and rectopexy with surgical anastomosis in the upper pelvis. No contrast extravasation from the bowel lumen to suggest anastomotic leak. 2. A few mildly dilated loops of small bowel are seen in the  mid abdomen without a definite discrete transition point to suggest high-grade bowel  obstruction. This favored to reflect postoperative ileus. 3. Moderate left lower lobe and mild right lower lobe atelectasis. Aortic Atherosclerosis (ICD10-I70.0). Electronically Signed   By: Zerita Boers M.D.   On: 10/21/2019 12:59   DG Abd 1 View  Result Date: 10/23/2019 CLINICAL DATA:  NG tube placement EXAM: ABDOMEN - 1 VIEW COMPARISON:  Radiograph 10/19/2019, CT 10/21/2019 FINDINGS: There is elevation of the left hemidiaphragm with atelectatic changes in the left lung base. The tip of the transesophageal tube terminates in the left upper quadrant however the side port remains at the GE junction and should be advanced 3-5 cm for optimal functioning. Few borderline air distended loops of small bowel are again seen in the mid to lower abdomen. No suspicious calcifications. Lower lumbar fusion hardware is again noted. No acute osseous or soft tissue abnormality. Multilevel degenerative changes are present in the imaged portions of the spine. IMPRESSION: The tip of the transesophageal tube terminates in the left upper quadrant however the side port remains at the GE junction and should be advanced 3-5 cm for optimal functioning. Persistent air-filled loops of small bowel, likely reflecting postoperative ileus. Electronically Signed   By: Lovena Le M.D.   On: 10/23/2019 05:18    Anti-infectives: Anti-infectives (From admission, onward)   Start     Dose/Rate Route Frequency Ordered Stop   10/13/19 2200  clindamycin (CLEOCIN) IVPB 900 mg     900 mg 100 mL/hr over 30 Minutes Intravenous Every 8 hours 10/13/19 2006 10/13/19 2329   10/13/19 1300  clindamycin (CLEOCIN) IVPB 900 mg  Status:  Discontinued     900 mg 100 mL/hr over 30 Minutes Intravenous  Once 10/13/19 1252 10/13/19 1959   10/13/19 1300  gentamicin (GARAMYCIN) 260 mg in dextrose 5 % 100 mL IVPB  Status:  Discontinued     5 mg/kg  51.4 kg (Adjusted) 106.5 mL/hr over 60 Minutes Intravenous  Once 10/13/19 1252 10/13/19 1959       Assessment/Plan: S/p robotic rectopexy and resection 10/13/19.  CT performed 2/20: no leak, ileus noted.  Wbc down today, no clinical signs of other infections Multimodal pain control Mobilize, pulmonary toilet NG- cont.  NPO Cont PICC and TPN Ambulate     LOS: 10 days    [name] [id number]

## 2019-10-23 NOTE — Progress Notes (Signed)
On arrival to room to flush NG tube, patient touching nose with eyes closed and I instructed patient not to touch NGT. Pt awakened and was startled and stated she didn't realize she was touching it. Pt c/o 9/10 pain in abdomen and given PRN medications. NGT secured and no cm markings present to determine if tube dislodged. Output decreased since 0000. Spoke with Physician on call and placed verbal order for KUB to confirm placement.

## 2019-10-23 NOTE — Progress Notes (Signed)
Advanced NGT 5 cm per Radiologist. Hand irrigated with 10cc of water and then once patent, flushed with 133ml of air and placed on LIWS.

## 2019-10-24 LAB — BASIC METABOLIC PANEL
Anion gap: 7 (ref 5–15)
BUN: 26 mg/dL — ABNORMAL HIGH (ref 6–20)
CO2: 33 mmol/L — ABNORMAL HIGH (ref 22–32)
Calcium: 8.9 mg/dL (ref 8.9–10.3)
Chloride: 96 mmol/L — ABNORMAL LOW (ref 98–111)
Creatinine, Ser: 0.8 mg/dL (ref 0.44–1.00)
GFR calc Af Amer: >60 mL/min (ref >60)
GFR calc non Af Amer: >60 mL/min (ref >60)
Glucose, Bld: 115 mg/dL — ABNORMAL HIGH (ref 70–99)
Potassium: 4.3 mmol/L (ref 3.5–5.1)
Sodium: 136 mmol/L (ref 135–145)

## 2019-10-24 LAB — GLUCOSE, CAPILLARY
Glucose-Capillary: 122 mg/dL — ABNORMAL HIGH (ref 70–99)
Glucose-Capillary: 143 mg/dL — ABNORMAL HIGH (ref 70–99)

## 2019-10-24 MED ORDER — HYDROMORPHONE HCL 1 MG/ML IJ SOLN
0.5000 mg | INTRAMUSCULAR | Status: DC | PRN
  Administered 2019-10-24 – 2019-10-25 (×9): 2 mg via INTRAVENOUS
  Administered 2019-10-26: 1 mg via INTRAVENOUS
  Filled 2019-10-24 (×8): qty 2
  Filled 2019-10-24: qty 1
  Filled 2019-10-24 (×2): qty 2

## 2019-10-24 MED ORDER — TRAVASOL 10 % IV SOLN
INTRAVENOUS | Status: AC
  Filled 2019-10-24: qty 840

## 2019-10-24 NOTE — Progress Notes (Signed)
Patient reports increased abdominal pain and "cramping" at this time.  MD aware and NG hooked back up to LIS.  Moderate amount brown drainage noted in suction canister.

## 2019-10-24 NOTE — Progress Notes (Signed)
11 Days Post-Op Robotic Rectopexy and sigmoid resection  Subjective/Chief Complaint:   distention better this am.  NG in place.  No BM, passing flatus.  Ambulated in hall 7 times  Objective: Vital signs in last 24 hours: Temp:  [98 F (36.7 C)-98.3 F (36.8 C)] 98.3 F (36.8 C) (02/23 0458) Pulse Rate:  [81-91] 91 (02/23 0458) Resp:  [14-18] 14 (02/23 0458) BP: (113-125)/(68-87) 113/68 (02/23 0458) SpO2:  [92 %-95 %] 92 % (02/23 0458) Weight:  [52.3 kg] 52.3 kg (02/23 0516) Last BM Date: 10/20/19  Intake/Output from previous day: 02/22 0701 - 02/23 0700 In: 1039.9 [I.V.:849.9; NG/GT:190] Out: 1700 [Urine:700; Emesis/NG output:1000] Intake/Output this shift: No intake/output data recorded.  General appearance: alert, cooperative Resp: Unlabored respiration GI: Soft, less distended,appropriately tender no peritonitis.  Incisions are clean dry and intact Skin: Skin color, texture, turgor normal. No rashes or lesions  Lab Results:  Recent Labs    10/23/19 0346  WBC 11.8*  HGB 13.0  HCT 40.6  PLT 434*   BMET Recent Labs    10/23/19 0346 10/24/19 0345  NA 138 136  K 3.9 4.3  CL 94* 96*  CO2 35* 33*  GLUCOSE 140* 115*  BUN 23* 26*  CREATININE 0.86 0.80  CALCIUM 8.9 8.9   PT/INR No results for input(s): LABPROT, INR in the last 72 hours. ABG No results for input(s): PHART, HCO3 in the last 72 hours.  Invalid input(s): PCO2, PO2  Studies/Results: DG Abd 1 View  Result Date: 10/23/2019 CLINICAL DATA:  NG tube placement EXAM: ABDOMEN - 1 VIEW COMPARISON:  Radiograph 10/19/2019, CT 10/21/2019 FINDINGS: There is elevation of the left hemidiaphragm with atelectatic changes in the left lung base. The tip of the transesophageal tube terminates in the left upper quadrant however the side port remains at the GE junction and should be advanced 3-5 cm for optimal functioning. Few borderline air distended loops of small bowel are again seen in the mid to lower abdomen. No  suspicious calcifications. Lower lumbar fusion hardware is again noted. No acute osseous or soft tissue abnormality. Multilevel degenerative changes are present in the imaged portions of the spine. IMPRESSION: The tip of the transesophageal tube terminates in the left upper quadrant however the side port remains at the GE junction and should be advanced 3-5 cm for optimal functioning. Persistent air-filled loops of small bowel, likely reflecting postoperative ileus. Electronically Signed   By: Lovena Le M.D.   On: 10/23/2019 05:18    Anti-infectives: Anti-infectives (From admission, onward)   Start     Dose/Rate Route Frequency Ordered Stop   10/13/19 2200  clindamycin (CLEOCIN) IVPB 900 mg     900 mg 100 mL/hr over 30 Minutes Intravenous Every 8 hours 10/13/19 2006 10/13/19 2329   10/13/19 1300  clindamycin (CLEOCIN) IVPB 900 mg  Status:  Discontinued     900 mg 100 mL/hr over 30 Minutes Intravenous  Once 10/13/19 1252 10/13/19 1959   10/13/19 1300  gentamicin (GARAMYCIN) 260 mg in dextrose 5 % 100 mL IVPB  Status:  Discontinued     5 mg/kg  51.4 kg (Adjusted) 106.5 mL/hr over 60 Minutes Intravenous  Once 10/13/19 1252 10/13/19 1959      Assessment/Plan: S/p robotic rectopexy and resection 10/13/19.  CT performed 2/20: no leak, ileus noted.  Wbc down, no clinical signs of other infections Multimodal pain control Mobilize, pulmonary toilet NG- cont.  Will clamp today and start clears Urinating well with foley out Cont PICC and  TPN Ambulate     LOS: 11 days    Jamie Schultz [id number]

## 2019-10-24 NOTE — Progress Notes (Signed)
NG clamped per MD order

## 2019-10-24 NOTE — Progress Notes (Addendum)
PHARMACY - TOTAL PARENTERAL NUTRITION CONSULT NOTE   Indication: Prolonged ileus  Patient Measurements: Height: 5' (152.4 cm) Weight: 115 lb 4.8 oz (52.3 kg) IBW/kg (Calculated) : 45.5 TPN AdjBW (KG): 49.2 Body mass index is 22.52 kg/m. Usual Weight: 60.5 kg on 10/11/19  Assessment: 59 yo F s/pXI ROBOT ASSISTED RECTOPEXY AND SIGMOIDECTOMY on 2/12. Pharmacy to start TPN 2/19 for prolonged ileus POD #7.   Glucose / Insulin: no hx DM, CBGs 115-122, goal <150;  4 units SSI given last 24hr- will DC SSI & CBGS Electrolytes: K 4.3 after 2 runs yesterday. CO2 slightly above normal range and CL slightly below. Max Cl in TPN Renal: Scr WNL LFTs / TGs: 2/22: AST/ALT slightly elevated, TG 111 Prealbumin / albumin: alb 2.9, prealb 14 up to 18.5 2/22 WNL Intake / Output; MIVF: no IVF currently; NGO down to 1000 mls; relgan 5 IV q8 added 2/20  GI Imaging: 2/17 & 18 KUB: Bowel dilatation concerning for high-grade small bowel obstruction 2/22 KUB: Persistent air-filled loops of small bowel, likely reflecting postoperative ileus Surgeries / Procedures: 2/12 XI ROBOT ASSISTED RECTOPEXY AND SIGMOIDECTOMY  Central access: PICC ordered 2/19 for TPN TPN start date: 2/19  Nutritional Goals (per RD recommendation on 10/20/19): KCal: 1500-1700, Protein: 78-85g, Fluid: >/= 1.5 L/day  Goal TPN rate is 70 mL/hr (provides 84 g of protein and 1613 kcals per day)  Current Nutrition:  NPO  TPN at 70 ml/hr  Plan:  At 1800:  continue TPN at goal rate 70 ml/hr   Electrolytes in TPN: 69mEq/L of Na, 57mEq/L of K, 3mEq/L of Ca, 94mEq/L of Mg, and 53mmol/L of Phos. Cl:Ac ratio: max Cl  Add standard MVI on MWF and trace elements to TPN daily  DC SSI & CBGs  Monitor TPN labs on Mon/Thurs  Eudelia Bunch, Pharm.D [phone] 10/24/2019 7:16 AM

## 2019-10-25 ENCOUNTER — Encounter (HOSPITAL_COMMUNITY): Payer: Self-pay | Admitting: General Surgery

## 2019-10-25 MED ORDER — SODIUM CHLORIDE 0.9 % IV SOLN
250.0000 mg | Freq: Three times a day (TID) | INTRAVENOUS | Status: DC
  Administered 2019-10-25 – 2019-10-26 (×3): 250 mg via INTRAVENOUS
  Filled 2019-10-25 (×6): qty 5

## 2019-10-25 MED ORDER — TRAVASOL 10 % IV SOLN
INTRAVENOUS | Status: DC
  Filled 2019-10-25: qty 840

## 2019-10-25 NOTE — Progress Notes (Signed)
Pt has done well all day with clamping NG.  Will d/c and advance to fulls.  [name], MD  [facility]

## 2019-10-25 NOTE — Progress Notes (Addendum)
PHARMACY - TOTAL PARENTERAL NUTRITION CONSULT NOTE   Indication: Prolonged ileus  Patient Measurements: Height: 5' (152.4 cm) Weight: 116 lb 6.5 oz (52.8 kg) IBW/kg (Calculated) : 45.5 TPN AdjBW (KG): 49.2 Body mass index is 22.73 kg/m. Usual Weight: 60.5 kg on 10/11/19  Assessment: 59 yo F s/pXI ROBOT ASSISTED RECTOPEXY AND SIGMOIDECTOMY on 2/12. Pharmacy to start TPN 2/19 for prolonged ileus POD #7.   Glucose / Insulin: no hx DM, SSI & CBGS dc'd 2/23 Electrolytes: no labs today, Max Cl in TPN Renal: Scr WNL LFTs / TGs: 2/22: AST/ALT slightly elevated, TG 111 Prealbumin / albumin: alb 2.9, prealb 14 up to 18.5 2/22 WNL Intake / Output; MIVF: no IVF currently; NGO up to 4100 mls; relgan 5 IV q8 added 2/20  GI: failed NGT clamping trial 2/23, abd pain & cramping GI Imaging: 2/17 & 18 KUB: Bowel dilatation concerning for high-grade small bowel obstruction 2/22 KUB: Persistent air-filled loops of small bowel, likely reflecting postoperative ileus Surgeries / Procedures: 2/12 XI ROBOT ASSISTED RECTOPEXY AND SIGMOIDECTOMY  Central access: PICC ordered 2/19 for TPN TPN start date: 2/19  Nutritional Goals (per RD recommendation on 10/20/19): KCal: 1500-1700, Protein: 78-85g, Fluid: >/= 1.5 L/day  Goal TPN rate is 70 mL/hr (provides 84 g of protein and 1613 kcals per day)  Current Nutrition:  CLD 2/23 TPN at 70 ml/hr  Plan:  At 1800:  continue TPN at goal rate 70 ml/hr   Electrolytes in TPN: 63mEq/L of Na, 80mEq/L of K, 100mEq/L of Ca, 51mEq/L of Mg, and 86mmol/L of Phos. Cl:Ac ratio: max Cl  Add standard MVI on MWF and trace elements to TPN daily  Monitor TPN labs on Mon/Thurs  Eudelia Bunch, Pharm.D [phone] 10/25/2019 7:11 AM

## 2019-10-25 NOTE — Progress Notes (Signed)
PHYSICAL THERAPY  Pt has amb several times today and with spouse who is just now leaving her room.  Pt back in bed resting.  Will continue to monitor for mobility.  Pt has been evaluated with no f/u post acute PT needs.  Rica Koyanagi  PTA Acute  Rehabilitation Services Pager      [phone] Office      [phone]

## 2019-10-25 NOTE — Progress Notes (Signed)
12 Days Post-Op Robotic Rectopexy and sigmoid resection  Subjective/Chief Complaint:   tried clamping yesterday but had to replace to LSW after 2 hrs due to bloating and pain.  distention better this am.  NG in place.  Had a BM, passing flatus.  Ambulated in hall   Objective: Vital signs in last 24 hours: Temp:  [98.3 F (36.8 C)-99.2 F (37.3 C)] 98.4 F (36.9 C) (02/24 0501) Pulse Rate:  [83-88] 88 (02/24 0501) Resp:  [12-16] 16 (02/24 0501) BP: (119-131)/(89-104) 119/89 (02/24 0501) SpO2:  [89 %-96 %] 96 % (02/24 0501) Weight:  [52.8 kg] 52.8 kg (02/24 0457) Last BM Date: 10/13/19  Intake/Output from previous day: 02/23 0701 - 02/24 0700 In: 851.6 [I.V.:851.6] Out: 4100 [Emesis/NG output:4100] Intake/Output this shift: Total I/O In: 591 [P.O.:591] Out: 1250 [Emesis/NG output:1250]  General appearance: alert, cooperative Resp: Unlabored respiration GI: Soft, less distended,appropriately tender no peritonitis.  Incisions are clean dry and intact Skin: Skin color, texture, turgor normal. No rashes or lesions  Lab Results:  Recent Labs    10/23/19 0346  WBC 11.8*  HGB 13.0  HCT 40.6  PLT 434*   BMET Recent Labs    10/23/19 0346 10/24/19 0345  NA 138 136  K 3.9 4.3  CL 94* 96*  CO2 35* 33*  GLUCOSE 140* 115*  BUN 23* 26*  CREATININE 0.86 0.80  CALCIUM 8.9 8.9   PT/INR No results for input(s): LABPROT, INR in the last 72 hours. ABG No results for input(s): PHART, HCO3 in the last 72 hours.  Invalid input(s): PCO2, PO2  Studies/Results: No results found.  Anti-infectives: Anti-infectives (From admission, onward)   Start     Dose/Rate Route Frequency Ordered Stop   10/25/19 1100  erythromycin 250 mg in sodium chloride 0.9 % 100 mL IVPB     250 mg 100 mL/hr over 60 Minutes Intravenous Every 8 hours 10/25/19 0900     10/13/19 2200  clindamycin (CLEOCIN) IVPB 900 mg     900 mg 100 mL/hr over 30 Minutes Intravenous Every 8 hours 10/13/19 2006 10/13/19  2329   10/13/19 1300  clindamycin (CLEOCIN) IVPB 900 mg  Status:  Discontinued     900 mg 100 mL/hr over 30 Minutes Intravenous  Once 10/13/19 1252 10/13/19 1959   10/13/19 1300  gentamicin (GARAMYCIN) 260 mg in dextrose 5 % 100 mL IVPB  Status:  Discontinued     5 mg/kg  51.4 kg (Adjusted) 106.5 mL/hr over 60 Minutes Intravenous  Once 10/13/19 1252 10/13/19 1959      Assessment/Plan: S/p robotic rectopexy and resection 10/13/19.  CT performed 2/20: no leak, ileus noted.  Wbc down, no clinical signs of other infections Multimodal pain control, wean IV narcotics Mobilize, pulmonary toilet NG-  Will try to clamp again today and cont clears Urinating well with foley out Cont PICC and TPN Ambulate     LOS: 12 days    [name] [id number]

## 2019-10-26 LAB — COMPREHENSIVE METABOLIC PANEL
ALT: 53 U/L — ABNORMAL HIGH (ref 0–44)
AST: 34 U/L (ref 15–41)
Albumin: 2.8 g/dL — ABNORMAL LOW (ref 3.5–5.0)
Alkaline Phosphatase: 103 U/L (ref 38–126)
Anion gap: 7 (ref 5–15)
BUN: 31 mg/dL — ABNORMAL HIGH (ref 6–20)
CO2: 26 mmol/L (ref 22–32)
Calcium: 8.8 mg/dL — ABNORMAL LOW (ref 8.9–10.3)
Chloride: 103 mmol/L (ref 98–111)
Creatinine, Ser: 0.81 mg/dL (ref 0.44–1.00)
GFR calc Af Amer: >60 mL/min (ref >60)
GFR calc non Af Amer: >60 mL/min (ref >60)
Glucose, Bld: 119 mg/dL — ABNORMAL HIGH (ref 70–99)
Potassium: 4.5 mmol/L (ref 3.5–5.1)
Sodium: 136 mmol/L (ref 135–145)
Total Bilirubin: 0.7 mg/dL (ref 0.3–1.2)
Total Protein: 6 g/dL — ABNORMAL LOW (ref 6.5–8.1)

## 2019-10-26 LAB — MAGNESIUM: Magnesium: 2.1 mg/dL (ref 1.7–2.4)

## 2019-10-26 LAB — PHOSPHORUS: Phosphorus: 3.6 mg/dL (ref 2.5–4.6)

## 2019-10-26 MED ORDER — ALPRAZOLAM 1 MG PO TABS
1.0000 mg | ORAL_TABLET | Freq: Four times a day (QID) | ORAL | Status: DC
  Administered 2019-10-26 (×4): 1 mg via ORAL
  Filled 2019-10-26 (×4): qty 1

## 2019-10-26 MED ORDER — PREGABALIN 50 MG PO CAPS
100.0000 mg | ORAL_CAPSULE | Freq: Three times a day (TID) | ORAL | Status: DC
  Administered 2019-10-26 – 2019-10-28 (×6): 100 mg via ORAL
  Filled 2019-10-26 (×6): qty 2

## 2019-10-26 MED ORDER — HYDROMORPHONE HCL 1 MG/ML IJ SOLN
0.5000 mg | INTRAMUSCULAR | Status: DC | PRN
  Administered 2019-10-26 (×2): 1 mg via INTRAVENOUS
  Filled 2019-10-26 (×2): qty 1

## 2019-10-26 MED ORDER — OXYCODONE HCL 5 MG PO TABS
10.0000 mg | ORAL_TABLET | Freq: Four times a day (QID) | ORAL | Status: DC | PRN
  Administered 2019-10-26 (×2): 10 mg via ORAL
  Filled 2019-10-26 (×2): qty 2

## 2019-10-26 MED ORDER — ERYTHROMYCIN BASE 250 MG PO TABS
250.0000 mg | ORAL_TABLET | Freq: Three times a day (TID) | ORAL | Status: DC
  Administered 2019-10-26 – 2019-10-27 (×5): 250 mg via ORAL
  Filled 2019-10-26 (×6): qty 1

## 2019-10-26 NOTE — Discharge Summary (Addendum)
Physician Discharge Summary  Patient ID: Jamie Schultz MRN: [id number] DOB/AGE: Apr 27, 1961 59 y.o.  Admit date: 10/13/2019 Discharge date: 10/27/2019  Admission Diagnoses: rectal prolapse  Discharge Diagnoses:  Active Problems:   Rectal prolapse prolonged post op ileus  Discharged Condition: good  Hospital Course: Pt admitted to the hospital after robotic assisted rectopexy and sigmoid resection.  Her diet was advanced as tolerated, but by POD 3 she was developing distention and her diet was eventually stopped.  She continued NPO for several days but was not improving.  An NG was placed.  She developed a mildyl elevated wbc and therefore a CT scan with PO and rectal contrast was obtained.  This showed ileus as well as no signs of leak or stricture.  After several days of decompression, her GI tract slowly began to work better.  She was placed on IV Reglan and Erythromycin.  She began to have bowel function on POD 11.  Her NG was removed after successful clamping trial on POD 12.  Her diet was advanced.  She was switched to PO medications.  By day of discharge, she was tolerating a diet and having bowel function.  Her pain was controlled on her home pain regimen and she was ambulating without difficulty.  It was felt that she was in stable condition for discharge to home.    Consults: None  Significant Diagnostic Studies: labs: cbc, bmet  Treatments: IV hydration, analgesia: Dilaudid, TPN and surgery: robotic assisted partial colectomy and rectopexy  Discharge Exam: Blood pressure 95/61, pulse 81, temperature 97.9 F (36.6 C), resp. rate 20, height 5' (1.524 m), weight 55.7 kg, SpO2 97 %. General appearance: alert and cooperative GI: normal findings: soft, non-tender Incision/Wound: clean, dry, intact  Disposition: home   Allergies as of 10/27/2019      Reactions   Bee Venom Anaphylaxis, Other (See Comments)   Bees/wasps/yellow jackets   Keflex [cephalexin] Anaphylaxis    Penicillins Anaphylaxis, Other (See Comments)   Has patient had a PCN reaction causing immediate rash, facial/tongue/throat swelling, SOB or lightheadedness with hypotension: Yes Has patient had a PCN reaction causing severe rash involving mucus membranes or skin necrosis: No Has patient had a PCN reaction that required hospitalization Yes, in the hospital already Has patient had a PCN reaction occurring within the last 10 years: Yes If all of the above answers are "NO", then may proceed with Cephalosporin use.   Sulfa Antibiotics Other (See Comments)   Renal failure   Hydrocodone Rash, Other (See Comments)   "I couldn't get my breath"   Ibuprofen Other (See Comments)   Vomiting blood   Contrast Media [iodinated Diagnostic Agents] Other (See Comments)   Patient states she had a previous reaction to iodinated contrast media agents. Premedicate.   Nsaids Itching   Codeine Itching, Rash   Cyclobenzaprine Other (See Comments)   Severe constipation   Fentanyl Nausea And Vomiting, Other (See Comments)   Severe vomiting   Methadone Other (See Comments)   Change in mental status   Neurontin [gabapentin] Hives   Tape Itching, Rash, Other (See Comments)   Blisters skin, Please use "paper" tape   Tegretol [carbamazepine] Hives, Rash   Toradol [ketorolac Tromethamine] Hives, Rash   Tramadol Hives, Rash   Tylenol [acetaminophen] Rash, Other (See Comments)   Vomiting blood      Medication List    TAKE these medications   ALPRAZolam 1 MG tablet Commonly known as: XANAX Take 1 mg by mouth 4 (four) times daily.  amphetamine-dextroamphetamine 20 MG tablet Commonly known as: ADDERALL Take 20 mg by mouth 2 (two) times daily.   Belsomra 20 MG Tabs Generic drug: Suvorexant Take 20 mg by mouth at bedtime.   diazepam 10 MG tablet Commonly known as: VALIUM Take 10 mg by mouth 3 (three) times daily as needed (spasms.).   Dulera 200-5 MCG/ACT Aero Generic drug: mometasone-formoterol TAKE 2  PUFFS BY MOUTH TWICE A DAY What changed: See the new instructions.   DULoxetine 60 MG capsule Commonly known as: CYMBALTA Take 60 mg by mouth daily after lunch.   EpiPen 2-Pak 0.3 mg/0.3 mL Soaj injection Generic drug: EPINEPHrine USE AS DIRECTED FOR ANAPYLACTIC REACTION (TO BEE STINGS) What changed: See the new instructions.   hydrOXYzine 10 MG tablet Commonly known as: ATARAX/VISTARIL Take 10 mg by mouth 3 (three) times daily as needed for itching.   Linzess 72 MCG capsule Generic drug: linaclotide Take 72 mcg by mouth 2 (two) times daily.   Lyrica 100 MG capsule Generic drug: pregabalin TAKE 1 TABLET BY MOUTH 3 TIMES A DAY What changed:   how much to take  how to take this   metoCLOPramide 5 MG tablet Commonly known as: Reglan Take 1 tablet (5 mg total) by mouth 3 (three) times daily before meals.   ondansetron 4 MG tablet Commonly known as: ZOFRAN Take 4 mg by mouth every 8 (eight) hours as needed for nausea or vomiting.   Oxycodone HCl 10 MG Tabs Take 10 mg by mouth 4 (four) times daily as needed (pain.).   pravastatin 20 MG tablet Commonly known as: PRAVACHOL Take 20 mg by mouth daily.   ProAir HFA 108 (90 Base) MCG/ACT inhaler Generic drug: albuterol TAKE 2 PUFFS BY MOUTH EVERY 6 HOURS AS NEEDED FOR WHEEZE OR SHORTNESS OF BREATH What changed: See the new instructions.   QUEtiapine 400 MG tablet Commonly known as: SEROQUEL Take 800 mg by mouth at bedtime.      Follow-up [place] Schedule an appointment as soon as possible for a visit.   Specialty: Catering manager information: [facility] [place] [address] [phone]        Consulting, [place] Follow up.   Contact information: [address] [place] [address] [phone]        [facility], [facility] .   Contact information: [place] [address] [phone]        Pc, [facility] .   Specialty: [facility] information: [address] [place] [address] [phone]        Leighton Ruff, MD. Schedule an appointment as soon as possible for a visit in 2 week(s).   Specialty: General Surgery Contact information: [place] [address] [phone]           Signed: [name] [id number], 8:03 AM

## 2019-10-26 NOTE — Progress Notes (Signed)
Nutrition Follow-up  DOCUMENTATION CODES:   Not applicable  INTERVENTION:  Ensure Enlive po daily, each supplement provides 350 kcal and 20 grams of protein  MVI with minerals daily  Medications have been reconciled for discharge, unable to provide interventions above at this time.   NUTRITION DIAGNOSIS:   Inadequate oral intake related to altered GI function(ileus s/p robotic rectopexy with sigmoid resection) as evidenced by NPO status.  Progressing, diet advanced to regular  GOAL:   Patient will meet greater than or equal to 90% of their needs  Progressing  MONITOR:   Labs, I & O's, Skin, Weight trends, Diet advancement  REASON FOR ASSESSMENT:   Consult New TPN/TNA  ASSESSMENT:   59 year old female with past medical history of COPD, chronic back pain, fibromyalgia, arthritis, anxiety, bipolar disorder, presented with15 year history of rectal prolapse that has gotten worse over the past few years. Patient reports significant pain, rectal bleeding and endorses long-standing history of constipation and straining.  Patient is 13 days post-op robotic rectopexy and sigmoid resection.  Per notes, patient tolerated clamping, NGT removed. She has done well with FL diet, documented with 100% po intake. Diet advanced to regular today, TPN discontinued. Will continue to monitor for po intake of meals and provide Ensure supplement to aid with estimated needs.   Current wt 118.14 lbs    Admit wt 128.26 lbs  I/Os: -9524 ml since admit    +2066 ml x 24 hrs    Emesis/NG output  1525 ml x 24 hrs  Medications reviewed and include: Erythromycin, Reglan, Lyrica, Florastor  Labs: 2/23 CBGs 122, 143 K/Mg/P - WNL  Diet Order:   Diet Order            Diet regular Room service appropriate? Yes; Fluid consistency: Thin  Diet effective now              EDUCATION NEEDS:   Not appropriate for education at this time  Skin:  Skin Assessment: Skin Integrity Issues: Skin Integrity  Issues:: Incisions Incisions: Closed: Abdomen; Rectum  Last BM:  2/19 type 6  Height:   Ht Readings from Last 1 Encounters:  10/13/19 5' (1.524 m)    Weight:   Wt Readings from Last 1 Encounters:  10/26/19 53.7 kg    Ideal Body Weight:  45.5 kg  BMI:  Body mass index is 23.12 kg/m.  Estimated Nutritional Needs:   Kcal:  1500-1700  Protein:  75-85  Fluid:  >/= 1.5 L/day   Lajuan Lines, RD, LDN Clinical Nutrition After Hours/Weekend Pager # in [place]

## 2019-10-26 NOTE — Progress Notes (Signed)
13 Days Post-Op Robotic Rectopexy and sigmoid resection  Subjective/Chief Complaint:  Tolerated clamping, ng removed.  Has done well with fulls overnight. Objective: Vital signs in last 24 hours: Temp:  [98.1 F (36.7 C)-98.7 F (37.1 C)] 98.7 F (37.1 C) (02/25 0557) Pulse Rate:  [83-88] 88 (02/25 0557) Resp:  [18-20] 18 (02/25 0557) BP: (113-153)/(61-83) 113/61 (02/25 0557) SpO2:  [92 %-95 %] 95 % (02/25 0557) Weight:  [53.7 kg] 53.7 kg (02/25 0500) Last BM Date: 10/24/19  Intake/Output from previous day: 02/24 0701 - 02/25 0700 In: 3591.6 [P.O.:1781; I.V.:1610.6; IV Piggyback:200] Out: 1525 [Emesis/NG output:1525] Intake/Output this shift: No intake/output data recorded.  General appearance: alert, cooperative Resp: Unlabored respiration GI: Soft, less distended,non-tender no peritonitis.  Incisions are clean dry and intact Skin: Skin color, texture, turgor normal. No rashes or lesions  Lab Results:  No results for input(s): WBC, HGB, HCT, PLT in the last 72 hours. BMET Recent Labs    10/24/19 0345 10/26/19 0316  NA 136 136  K 4.3 4.5  CL 96* 103  CO2 33* 26  GLUCOSE 115* 119*  BUN 26* 31*  CREATININE 0.80 0.81  CALCIUM 8.9 8.8*   PT/INR No results for input(s): LABPROT, INR in the last 72 hours. ABG No results for input(s): PHART, HCO3 in the last 72 hours.  Invalid input(s): PCO2, PO2  Studies/Results: No results found.  Anti-infectives: Anti-infectives (From admission, onward)   Start     Dose/Rate Route Frequency Ordered Stop   10/26/19 0900  erythromycin (E-MYCIN) tablet 250 mg     250 mg Oral 3 times daily with meals & bedtime 10/26/19 0733     10/25/19 1100  erythromycin 250 mg in sodium chloride 0.9 % 100 mL IVPB  Status:  Discontinued     250 mg 100 mL/hr over 60 Minutes Intravenous Every 8 hours 10/25/19 0900 10/26/19 0733   10/13/19 2200  clindamycin (CLEOCIN) IVPB 900 mg     900 mg 100 mL/hr over 30 Minutes Intravenous Every 8 hours  10/13/19 2006 10/13/19 2329   10/13/19 1300  clindamycin (CLEOCIN) IVPB 900 mg  Status:  Discontinued     900 mg 100 mL/hr over 30 Minutes Intravenous  Once 10/13/19 1252 10/13/19 1959   10/13/19 1300  gentamicin (GARAMYCIN) 260 mg in dextrose 5 % 100 mL IVPB  Status:  Discontinued     5 mg/kg  51.4 kg (Adjusted) 106.5 mL/hr over 60 Minutes Intravenous  Once 10/13/19 1252 10/13/19 1959      Assessment/Plan: S/p robotic rectopexy and resection 10/13/19.  CT performed 2/20: no leak, ileus noted.  Wbc down, no clinical signs of other infections Multimodal pain control, wean IV narcotics to baseline PO regimen Mobilize, pulmonary toilet Urinating well with foley out D/c TPN Ambulate     LOS: 13 days    [name] [id number]

## 2019-10-26 NOTE — Discharge Instructions (Signed)
ABDOMINAL SURGERY: POST OP INSTRUCTIONS  1. DIET: Follow a light bland diet the first 24 hours after arrival home, such as soup, liquids, crackers, etc.  Be sure to include lots of fluids daily.  Avoid fast food or heavy meals as your are more likely to get nauseated.  Do not eat any uncooked fruits or vegetables for the next 2 weeks as your colon heals. 2. Take your usually prescribed home medications unless otherwise directed. 3. PAIN CONTROL: a. Pain is best controlled by a usual combination of three different methods TOGETHER: i. Ice/Heat ii. Over the counter pain medication iii. Prescription pain medication b. Most patients will experience some swelling and bruising around the incisions.  Ice packs or heating pads (30-60 minutes up to 6 times a day) will help. Use ice for the first few days to help decrease swelling and bruising, then switch to heat to help relax tight/sore spots and speed recovery.  Some people prefer to use ice alone, heat alone, alternating between ice & heat.  Experiment to what works for you.  Swelling and bruising can take several weeks to resolve.   c. It is helpful to take an over-the-counter pain medication regularly for the first few weeks.  Choose one of the following that works best for you: i. Naproxen (Aleve, etc)  Two 220mg  tabs twice a day ii. Ibuprofen (Advil, etc) Three 200mg  tabs four times a day (every meal & bedtime) iii. Acetaminophen (Tylenol, etc) 500-650mg  four times a day (every meal & bedtime)   4. Avoid getting constipated.  Between the surgery and the pain medications, it is common to experience some constipation.  Increasing fluid intake and taking a fiber supplement (such as Metamucil, Citrucel, FiberCon, MiraLax, etc) 1-2 times a day regularly will usually help prevent this problem from occurring.  A mild laxative (prune juice, Milk of Magnesia, MiraLax, etc) should be taken according to package directions if there are no bowel movements after 48  hours.   5. Watch out for diarrhea.  If you have many loose bowel movements, simplify your diet to bland foods & liquids for a few days.  Stop any stool softeners and decrease your fiber supplement.  Switching to mild anti-diarrheal medications (Kayopectate, Pepto Bismol) can help.  If this worsens or does not improve, please call us. 6. Wash / shower every day.  You may shower over the incision / wound.  Avoid baths until the skin is fully healed.  Continue to shower over incision(s) after the dressing is off. 7. Remove your waterproof bandages 5 days after surgery.  You may leave the incision open to air.  You may replace a dressing/Band-Aid to cover the incision for comfort if you wish. 8. ACTIVITIES as tolerated:   a. You may resume regular (light) daily activities beginning the next day--such as daily self-care, walking, climbing stairs--gradually increasing activities as tolerated.  If you can walk 30 minutes without difficulty, it is safe to try more intense activity such as jogging, treadmill, bicycling, low-impact aerobics, swimming, etc. b. Save the most intensive and strenuous activity for last such as sit-ups, heavy lifting, contact sports, etc  Refrain from any heavy lifting or straining until you are off narcotics for pain control.   c. DO NOT PUSH THROUGH PAIN.  Let pain be your guide: If it hurts to do something, don't do it.  Pain is your body warning you to avoid that activity for another week until the pain goes down. d. You may drive when  you are no longer taking prescription pain medication, you can comfortably wear a seatbelt, and you can safely maneuver your car and apply brakes. e. Dennis Bast may have sexual intercourse when it is comfortable.  9. FOLLOW UP in our office a. Please call [facility] at [phone] to set up an appointment to see your surgeon in the office for a follow-up appointment approximately 1-2 weeks after your surgery. b. Make sure that you call for this appointment the  day you arrive home to insure a convenient appointment time. 10. IF YOU HAVE DISABILITY OR FAMILY LEAVE FORMS, BRING THEM TO THE OFFICE FOR PROCESSING.  DO NOT GIVE THEM TO YOUR DOCTOR.   WHEN TO CALL us [phone]: 1. Poor pain control 2. Reactions / problems with new medications (rash/itching, nausea, etc)  3. Fever over 101.5 F (38.5 C) 4. Inability to urinate 5. Nausea and/or vomiting 6. Worsening swelling or bruising 7. Continued bleeding from incision. 8. Increased pain, redness, or drainage from the incision  The clinic staff is available to answer your questions during regular business hours (8:30am-5pm).  Please don't hesitate to call and ask to speak to one of our nurses for clinical concerns.   A surgeon from [facility] is always on call at the hospitals   If you have a medical emergency, go to the nearest emergency room or call 911.    [facility], [place], [place], [place], [place]  [id number] ? MAIN: [phone] ? TOLL FREE: [phone] ? FAX [phone] [email or web address]

## 2019-10-27 ENCOUNTER — Inpatient Hospital Stay (HOSPITAL_COMMUNITY): Payer: Medicaid Other

## 2019-10-27 LAB — CBC
HCT: 36.7 % (ref 36.0–46.0)
Hemoglobin: 11.6 g/dL — ABNORMAL LOW (ref 12.0–15.0)
MCH: 29.9 pg (ref 26.0–34.0)
MCHC: 31.6 g/dL (ref 30.0–36.0)
MCV: 94.6 fL (ref 80.0–100.0)
Platelets: 438 10*3/uL — ABNORMAL HIGH (ref 150–400)
RBC: 3.88 MIL/uL (ref 3.87–5.11)
RDW: 14.6 % (ref 11.5–15.5)
WBC: 14.2 10*3/uL — ABNORMAL HIGH (ref 4.0–10.5)
nRBC: 0 % (ref 0.0–0.2)

## 2019-10-27 LAB — URINALYSIS, ROUTINE W REFLEX MICROSCOPIC
Bilirubin Urine: NEGATIVE
Glucose, UA: NEGATIVE mg/dL
Hgb urine dipstick: NEGATIVE
Ketones, ur: NEGATIVE mg/dL
Leukocytes,Ua: NEGATIVE
Nitrite: NEGATIVE
Protein, ur: NEGATIVE mg/dL
Specific Gravity, Urine: 1.011 (ref 1.005–1.030)
pH: 7 (ref 5.0–8.0)

## 2019-10-27 LAB — BASIC METABOLIC PANEL
Anion gap: 5 (ref 5–15)
BUN: 23 mg/dL — ABNORMAL HIGH (ref 6–20)
CO2: 25 mmol/L (ref 22–32)
Calcium: 9.2 mg/dL (ref 8.9–10.3)
Chloride: 107 mmol/L (ref 98–111)
Creatinine, Ser: 0.85 mg/dL (ref 0.44–1.00)
GFR calc Af Amer: >60 mL/min (ref >60)
GFR calc non Af Amer: >60 mL/min (ref >60)
Glucose, Bld: 112 mg/dL — ABNORMAL HIGH (ref 70–99)
Potassium: 4.1 mmol/L (ref 3.5–5.1)
Sodium: 137 mmol/L (ref 135–145)

## 2019-10-27 MED ORDER — OXYCODONE HCL 5 MG PO TABS
5.0000 mg | ORAL_TABLET | ORAL | Status: DC | PRN
  Administered 2019-10-27 – 2019-10-28 (×2): 10 mg via ORAL
  Filled 2019-10-27 (×3): qty 2

## 2019-10-27 MED ORDER — METOCLOPRAMIDE HCL 5 MG PO TABS: 5.0000 mg | ORAL_TABLET | Freq: Three times a day (TID) | ORAL | 1 refills | Status: DC

## 2019-10-27 NOTE — Progress Notes (Signed)
Patient educated about removal of discharge order and patients states "I am leaving now." Patient made aware of elevated WBC and infection. Patient states " I will go to my doctor and get antibiotics." Spouse at bedside not wanting her to leave and patient is being verbally abusive. PICC was removed and AMA papers signed. Patient educated about getting a urine sample before leaving per MD request and she refuses.

## 2019-10-27 NOTE — Progress Notes (Signed)
Physical Therapy Treatment Patient Details Name: Jamie Schultz MRN: [id number] DOB: 12-12-1960 Today's Date: 10/27/2019    History of Present Illness Pt admitted with rectal prolapse and is s/p Rectoplexy and Sigmoidectomy.  Pt with hx of COPD, Fibromyalgia, L TSR, Bil TKR, cervical fusion, back surgery and panic attacks.  Noted plan was for d/c today but that was cancelled due to lethargy.  Pt took extra xanax on her own last night and now with increased lethargy.  Now has tele sitter.    PT Comments    Pt with lethargy and decreased safety awareness limiting PT today.  She was able to ambulate but needed increased assist for balance and RW.  Pt admitted to taking extra xanax last night (nursing/MD aware), this is likely source of decreased safety today.   Follow Up Recommendations  No PT follow up     Equipment Recommendations  None recommended by PT    Recommendations for Other Services       Precautions / Restrictions Precautions Precautions: Fall    Mobility  Bed Mobility Overal bed mobility: Needs Assistance Bed Mobility: Supine to Sit;Sit to Supine     Supine to sit: Min assist Sit to supine: Min assist   General bed mobility comments: for balance /safety  Transfers Overall transfer level: Needs assistance Equipment used: Rolling walker (2 wheeled) Transfers: Sit to/from Stand Sit to Stand: Min guard            Ambulation/Gait Ambulation/Gait assistance: Herbalist (Feet): 200 Feet Assistive device: Rolling walker (2 wheeled) Gait Pattern/deviations: Step-through pattern;Drifts right/left;Scissoring Gait velocity: decr   General Gait Details: Pt with scissor gait <10%; drifting R and L; required assist with RW to avoid object; poor safety awareness   Stairs             Wheelchair Mobility    Modified Rankin (Stroke Patients Only)       Balance Overall balance assessment: Needs assistance Sitting-balance support: No upper  extremity supported;Feet supported Sitting balance-Leahy Scale: Poor Sitting balance - Comments: required min guard at times; pt quickly leaning forward at times -states "I'm not falling, just reaching to scratch my foot."  However, without PT assist may have lost balance   Standing balance support: Single extremity supported Standing balance-Leahy Scale: Poor                              Cognition Arousal/Alertness: Lethargic Behavior During Therapy: Anxious Overall Cognitive Status: Impaired/Different from baseline Area of Impairment: Following commands;Safety/judgement                       Following Commands: Follows multi-step commands inconsistently Safety/Judgement: Decreased awareness of safety     General Comments: Pt lethargic and slurring speech.  She required increased cues for safety and min guard at all times.  Pt's phone was ringing and when went to answer it she just held her hand up to ears (without the phone) and started talking.      Exercises      General Comments        Pertinent Vitals/Pain Pain Assessment: No/denies pain    Home Living                      Prior Function            PT Goals (current goals can now be found in the care  plan section) Progress towards PT goals: Not progressing toward goals - comment(limited due to lethargy/taking extra xanax)    Frequency    Min 3X/week      PT Plan Current plan remains appropriate    Co-evaluation              AM-PAC PT "6 Clicks" Mobility   Outcome Measure  Help needed turning from your back to your side while in a flat bed without using bedrails?: A Little Help needed moving from lying on your back to sitting on the side of a flat bed without using bedrails?: A Little Help needed moving to and from a bed to a chair (including a wheelchair)?: A Little Help needed standing up from a chair using your arms (e.g., wheelchair or bedside chair)?: A  Little Help needed to walk in hospital room?: A Little Help needed climbing 3-5 steps with a railing? : A Lot 6 Click Score: 17    End of Session Equipment Utilized During Treatment: Gait belt Activity Tolerance: Patient tolerated treatment well Patient left: in bed;with call bell/phone within reach;with bed alarm set Nurse Communication: Mobility status(still with confusion; needs help w lunch) PT Visit Diagnosis: Difficulty in walking, not elsewhere classified (R26.2)     Time: [id number] PT Time Calculation (min) (ACUTE ONLY): 15 min  Charges:  $Gait Training: 8-22 mins                     Maggie Font, PT Acute Rehab Services Pager [phone] [facility] [phone] [facility] 10/27/2019, 3:11 PM

## 2019-10-27 NOTE — Progress Notes (Signed)
Update:Patient has now decided to stay and AMA papers torn up per patient request. Patient states she will give urine sample and get chest xray. IV team paged for IV placement. Spouse at bedside.

## 2019-10-27 NOTE — Progress Notes (Signed)
Patient no longer discharging today per MD Marcello Moores patient will keep PICC line for now, attempted to change patient's picc line dsg at this time patient refused.

## 2019-10-27 NOTE — Progress Notes (Signed)
At approximately 1100, patient was coming out of bathroom and very upset stating that she is going home. This RN made patient aware that Dr. Marcello Moores has cancelled discharge for today due to patient being very drowsy this a.m. and an increase in the patient's white count. Patient stated that she does care and that she is going home today. This RN made patient aware that she would have to leave against medical advice (AMA) if she wanted leave. Patient stated that she didn't care and would leave anyway. Spouse called and Dr. Marcello Moores notified. Patient admitted to this RN that she was drowsy this morning because she did not take the xanax that was given yesterday at the scheduled time. She then stated that she took the xanax with another dose of xanax that was given by RN, therefore meaning she took a total of 2mg  of xanax at once last night with her bedtime dose. Patient stated that she did this because she wanted to sleep. This RN made primary RN Alex aware who notified Dr. Marcello Moores. Department leadership also aware and suggested to have a telesitter monitor in place. Telesitter is now set up and nursing staff is aware to ensure that patient takes medications while an RN is at the bedside.

## 2019-10-27 NOTE — Progress Notes (Signed)
Discharge instructions given and reviewed. All questions answered. Awaiting husband to arrive to transport patient home. Call bell in reach.

## 2019-10-27 NOTE — Progress Notes (Signed)
Notified by Charge RN that patient decided to leave AMA and needed PICC line removed prior to patient leaving. PICC line was removed and education given to patient for her to lay 30 mins after picc removal, patient stated she was ready to go and was not laying down for no 30 mins. Charge RN was present during patient's event

## 2019-10-27 NOTE — Progress Notes (Addendum)
Called to patient's room due to pt's lethargy.  Pt somnolent but arouses and answers questions appropriately.  Vitals WNL's.  Will get labs and acute abd series.  Sedating meds d/c'd.  If these are normal and her somnolence does not improve, will consult medicine for further evaluation.  Cancel discharge for today.    Labs reviewed.  This shows elevated wbc.  Will get UA. Await abd and CXR.   Pt wants to leave AMA.  I told her that I do not recommend this and we need to work up her wbc.     [name], MD  [facility]

## 2019-10-28 LAB — CBC
HCT: 38.1 % (ref 36.0–46.0)
Hemoglobin: 12.1 g/dL (ref 12.0–15.0)
MCH: 30 pg (ref 26.0–34.0)
MCHC: 31.8 g/dL (ref 30.0–36.0)
MCV: 94.5 fL (ref 80.0–100.0)
Platelets: 479 10*3/uL — ABNORMAL HIGH (ref 150–400)
RBC: 4.03 MIL/uL (ref 3.87–5.11)
RDW: 14.6 % (ref 11.5–15.5)
WBC: 10 10*3/uL (ref 4.0–10.5)
nRBC: 0 % (ref 0.0–0.2)

## 2019-10-28 MED ORDER — OXYCODONE HCL 5 MG PO TABS: 5.0000 mg | ORAL_TABLET | ORAL | 0 refills | Status: DC | PRN

## 2019-10-28 NOTE — Progress Notes (Signed)
15 Days Post-Op   Subjective/Chief Complaint: ready for discharge  Awake alert feel well wants to go home no nausea / vomiting    Objective: Vital signs in last 24 hours: Temp:  [98.2 F (36.8 C)-98.6 F (37 C)] 98.2 F (36.8 C) (02/26 1959) Pulse Rate:  [86-93] 93 (02/26 1959) Resp:  [16-20] 20 (02/26 1959) BP: (112-127)/(68-80) 127/80 (02/26 1959) SpO2:  [99 %-100 %] 99 % (02/26 1959) Last BM Date: 10/26/19  Intake/Output from previous day: 02/26 0701 - 02/27 0700 In: 360 [P.O.:360] Out: -  Intake/Output this shift: No intake/output data recorded.  Incision/Wound:CDI soft NT   Lab Results:  Recent Labs    10/27/19 1022 10/28/19 0606  WBC 14.2* 10.0  HGB 11.6* 12.1  HCT 36.7 38.1  PLT 438* 479*   BMET Recent Labs    10/26/19 0316 10/27/19 1022  NA 136 137  K 4.5 4.1  CL 103 107  CO2 26 25  GLUCOSE 119* 112*  BUN 31* 23*  CREATININE 0.81 0.85  CALCIUM 8.8* 9.2   PT/INR No results for input(s): LABPROT, INR in the last 72 hours. ABG No results for input(s): PHART, HCO3 in the last 72 hours.  Invalid input(s): PCO2, PO2  Studies/Results: DG ABD ACUTE 2+V W 1V CHEST  Result Date: 10/27/2019 CLINICAL DATA:  ABDOMINAL PAIN. EXAM: DG ABDOMEN ACUTE W/ 1V CHEST COMPARISON:  ABDOMINAL RADIOGRAPH DATED 10/23/2019 AND CHEST X-RAY DATED 05/28/2019 FINDINGS: There is no free air in the abdomen. No dilated loops of large or small bowel. Moderate stool in the right side of the colon. Minimal atelectasis at the lung bases. Heart size and vascularity are normal. No acute bone abnormalities. IMPRESSION: Benign-appearing abdomen.  Minimal atelectasis at the lung bases. Electronically Signed   By: Lorriane Shire M.D.   On: 10/27/2019 12:55    Anti-infectives: Anti-infectives (From admission, onward)   Start     Dose/Rate Route Frequency Ordered Stop   10/26/19 0900  erythromycin (E-MYCIN) tablet 250 mg  Status:  Discontinued     250 mg Oral 3 times daily with meals &  bedtime 10/26/19 0733 10/27/19 1017   10/25/19 1100  erythromycin 250 mg in sodium chloride 0.9 % 100 mL IVPB  Status:  Discontinued     250 mg 100 mL/hr over 60 Minutes Intravenous Every 8 hours 10/25/19 0900 10/26/19 0733   10/13/19 2200  clindamycin (CLEOCIN) IVPB 900 mg     900 mg 100 mL/hr over 30 Minutes Intravenous Every 8 hours 10/13/19 2006 10/13/19 2329   10/13/19 1300  clindamycin (CLEOCIN) IVPB 900 mg  Status:  Discontinued     900 mg 100 mL/hr over 30 Minutes Intravenous  Once 10/13/19 1252 10/13/19 1959   10/13/19 1300  gentamicin (GARAMYCIN) 260 mg in dextrose 5 % 100 mL IVPB  Status:  Discontinued     5 mg/kg  51.4 kg (Adjusted) 106.5 mL/hr over 60 Minutes Intravenous  Once 10/13/19 1252 10/13/19 1959      Assessment/Plan: s/p Procedure(s): XI ROBOT ASSISTED SIGMOIDECTOMY AND RECTOPEXY, RIGID PROCTOSCOPY (N/A) DISCHARGE  Mental status clear and WBC down  She feel great and is completely appropriate   LOS: 15 days    Marcello Moores A Vennie Waymire 10/28/2019

## 2019-10-28 NOTE — Final Progress Note (Signed)
Patient remained free of falls this shift. A&O x4. Patient cleared for discharged. Provided and went over paperwork with patient. Patient accompanied downstairs with charge nurse.

## 2019-10-28 NOTE — Progress Notes (Signed)
Pt leaving this morning with her son. Alert, oriented, and without c/o. Telling me she is going home NOW. Son at front door to pick her up. Pt aware of followup appt. Pt aware to pickup prescriptions.

## 2019-11-13 ENCOUNTER — Emergency Department: Payer: Medicaid Other

## 2019-11-13 ENCOUNTER — Inpatient Hospital Stay
Admission: EM | Admit: 2019-11-13 | Discharge: 2019-11-14 | DRG: 190 | Discharge status: Against Medical Advice | Payer: Medicaid Other | Attending: Family Medicine | Admitting: Family Medicine

## 2019-11-13 ENCOUNTER — Other Ambulatory Visit: Payer: Self-pay

## 2019-11-13 DIAGNOSIS — M797 Fibromyalgia: Secondary | ICD-10-CM | POA: Diagnosis present

## 2019-11-13 DIAGNOSIS — T424X5A Adverse effect of benzodiazepines, initial encounter: Secondary | ICD-10-CM | POA: Diagnosis present

## 2019-11-13 DIAGNOSIS — K589 Irritable bowel syndrome without diarrhea: Secondary | ICD-10-CM | POA: Diagnosis present

## 2019-11-13 DIAGNOSIS — G92 Toxic encephalopathy: Secondary | ICD-10-CM | POA: Diagnosis present

## 2019-11-13 DIAGNOSIS — G9340 Encephalopathy, unspecified: Secondary | ICD-10-CM | POA: Diagnosis present

## 2019-11-13 DIAGNOSIS — E785 Hyperlipidemia, unspecified: Secondary | ICD-10-CM | POA: Diagnosis present

## 2019-11-13 DIAGNOSIS — F410 Panic disorder [episodic paroxysmal anxiety] without agoraphobia: Secondary | ICD-10-CM | POA: Diagnosis present

## 2019-11-13 DIAGNOSIS — Z825 Family history of asthma and other chronic lower respiratory diseases: Secondary | ICD-10-CM | POA: Diagnosis not present

## 2019-11-13 DIAGNOSIS — J9601 Acute respiratory failure with hypoxia: Secondary | ICD-10-CM | POA: Diagnosis present

## 2019-11-13 DIAGNOSIS — Z87891 Personal history of nicotine dependence: Secondary | ICD-10-CM

## 2019-11-13 DIAGNOSIS — G629 Polyneuropathy, unspecified: Secondary | ICD-10-CM | POA: Diagnosis present

## 2019-11-13 DIAGNOSIS — J441 Chronic obstructive pulmonary disease with (acute) exacerbation: Secondary | ICD-10-CM

## 2019-11-13 DIAGNOSIS — R0902 Hypoxemia: Secondary | ICD-10-CM

## 2019-11-13 DIAGNOSIS — R4182 Altered mental status, unspecified: Secondary | ICD-10-CM

## 2019-11-13 DIAGNOSIS — J440 Chronic obstructive pulmonary disease with acute lower respiratory infection: Secondary | ICD-10-CM | POA: Diagnosis not present

## 2019-11-13 DIAGNOSIS — G9341 Metabolic encephalopathy: Secondary | ICD-10-CM

## 2019-11-13 DIAGNOSIS — Z90710 Acquired absence of both cervix and uterus: Secondary | ICD-10-CM

## 2019-11-13 LAB — COMPREHENSIVE METABOLIC PANEL WITH GFR
ALT: 18 U/L (ref 0–44)
AST: 18 U/L (ref 15–41)
Albumin: 3.6 g/dL (ref 3.5–5.0)
Alkaline Phosphatase: 80 U/L (ref 38–126)
Anion gap: 6 (ref 5–15)
BUN: 13 mg/dL (ref 6–20)
CO2: 29 mmol/L (ref 22–32)
Calcium: 9.4 mg/dL (ref 8.9–10.3)
Chloride: 105 mmol/L (ref 98–111)
Creatinine, Ser: 0.74 mg/dL (ref 0.44–1.00)
GFR calc Af Amer: >60 mL/min
GFR calc non Af Amer: >60 mL/min
Glucose, Bld: 168 mg/dL — ABNORMAL HIGH (ref 70–99)
Potassium: 4.2 mmol/L (ref 3.5–5.1)
Sodium: 140 mmol/L (ref 135–145)
Total Bilirubin: 0.5 mg/dL (ref 0.3–1.2)
Total Protein: 7.9 g/dL (ref 6.5–8.1)

## 2019-11-13 LAB — CBC WITH DIFFERENTIAL/PLATELET
Abs Immature Granulocytes: 0.25 10*3/uL — ABNORMAL HIGH (ref 0.00–0.07)
Basophils Absolute: 0.1 10*3/uL (ref 0.0–0.1)
Basophils Relative: 0 %
Eosinophils Absolute: 0.1 10*3/uL (ref 0.0–0.5)
Eosinophils Relative: 1 %
HCT: 36.9 % (ref 36.0–46.0)
Hemoglobin: 11.9 g/dL — ABNORMAL LOW (ref 12.0–15.0)
Immature Granulocytes: 2 %
Lymphocytes Relative: 10 %
Lymphs Abs: 1.5 10*3/uL (ref 0.7–4.0)
MCH: 29.9 pg (ref 26.0–34.0)
MCHC: 32.2 g/dL (ref 30.0–36.0)
MCV: 92.7 fL (ref 80.0–100.0)
Monocytes Absolute: 0.7 10*3/uL (ref 0.1–1.0)
Monocytes Relative: 5 %
Neutro Abs: 12.9 10*3/uL — ABNORMAL HIGH (ref 1.7–7.7)
Neutrophils Relative %: 82 %
Platelets: 382 10*3/uL (ref 150–400)
RBC: 3.98 MIL/uL (ref 3.87–5.11)
RDW: 15.2 % (ref 11.5–15.5)
WBC: 15.5 10*3/uL — ABNORMAL HIGH (ref 4.0–10.5)
nRBC: 0 % (ref 0.0–0.2)

## 2019-11-13 LAB — LIPASE, BLOOD: Lipase: 18 U/L (ref 11–51)

## 2019-11-13 MED ORDER — ONDANSETRON HCL 4 MG/2ML IJ SOLN
4.0000 mg | Freq: Once | INTRAMUSCULAR | Status: AC
  Administered 2019-11-13: 4 mg via INTRAVENOUS
  Filled 2019-11-13: qty 2

## 2019-11-13 NOTE — ED Notes (Signed)
PT husband updated.  PT on phone with husband.

## 2019-11-13 NOTE — ED Notes (Signed)
Updated pts spouse

## 2019-11-13 NOTE — ED Notes (Addendum)
Requested antiemetic for pt d/t nausea Informed md that pt's husband was concerned for her thyroid levels
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
## 2019-11-13 NOTE — ED Notes (Signed)
PT states still unable to provide urine sample.

## 2019-11-13 NOTE — ED Triage Notes (Signed)
PT to ED via EMS from home. Initial call out was for OD. PT recently had rectal reconstruction surgery and sent home with pain meds. Husband came home to find pt unresponsive. EMS arrived and pt became combative. 5mg  of haldol adminsistered. Even before haldol pt was breathing 8x minute and O2 was 82%. PT responsive when stimulated. PT has not had BM in 5 days. PT with abd pain and distention .
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
## 2019-11-14 MED ORDER — ONDANSETRON HCL 4 MG PO TABS: 4.0000 mg | ORAL_TABLET | Freq: Four times a day (QID) | ORAL | Status: DC | PRN

## 2019-11-14 MED ORDER — IPRATROPIUM-ALBUTEROL 0.5-2.5 (3) MG/3ML IN SOLN: 3.0000 mL | Freq: Four times a day (QID) | RESPIRATORY_TRACT | Status: DC

## 2019-11-14 MED ORDER — ENOXAPARIN SODIUM 40 MG/0.4ML ~~LOC~~ SOLN: 40.0000 mg | SUBCUTANEOUS | Status: DC

## 2019-11-14 MED ORDER — HYDROXYZINE HCL 10 MG PO TABS
10.0000 mg | ORAL_TABLET | Freq: Three times a day (TID) | ORAL | Status: DC | PRN
  Filled 2019-11-14: qty 1

## 2019-11-14 MED ORDER — GUAIFENESIN ER 600 MG PO TB12
600.0000 mg | ORAL_TABLET | Freq: Two times a day (BID) | ORAL | Status: DC
  Administered 2019-11-14: 600 mg via ORAL
  Filled 2019-11-14: qty 1

## 2019-11-14 MED ORDER — QUETIAPINE FUMARATE 300 MG PO TABS: 800.0000 mg | ORAL_TABLET | Freq: Every day | ORAL | Status: DC

## 2019-11-14 MED ORDER — PRAVASTATIN SODIUM 20 MG PO TABS: 20.0000 mg | ORAL_TABLET | Freq: Every day | ORAL | Status: DC

## 2019-11-14 MED ORDER — DIAZEPAM 5 MG PO TABS
5.0000 mg | ORAL_TABLET | Freq: Three times a day (TID) | ORAL | Status: DC | PRN
  Administered 2019-11-14: 5 mg via ORAL
  Filled 2019-11-14: qty 1

## 2019-11-14 MED ORDER — ALPRAZOLAM 0.5 MG PO TABS: 1.0000 mg | ORAL_TABLET | Freq: Four times a day (QID) | ORAL | Status: DC

## 2019-11-14 MED ORDER — OXYCODONE HCL 5 MG PO TABS
10.0000 mg | ORAL_TABLET | Freq: Four times a day (QID) | ORAL | Status: DC | PRN
  Administered 2019-11-14: 10 mg via ORAL

## 2019-11-14 MED ORDER — METHYLPREDNISOLONE SODIUM SUCC 40 MG IJ SOLR: 40.0000 mg | Freq: Three times a day (TID) | INTRAMUSCULAR | Status: DC

## 2019-11-14 MED ORDER — EPINEPHRINE 0.3 MG/0.3ML IJ SOAJ: 0.3000 mg | INTRAMUSCULAR | Status: DC | PRN

## 2019-11-14 MED ORDER — ONDANSETRON HCL 4 MG/2ML IJ SOLN: 4.0000 mg | Freq: Four times a day (QID) | INTRAMUSCULAR | Status: DC | PRN

## 2019-11-14 MED ORDER — OXYCODONE HCL 5 MG PO TABS
5.0000 mg | ORAL_TABLET | ORAL | Status: DC | PRN
  Filled 2019-11-14 (×2): qty 1

## 2019-11-14 MED ORDER — SODIUM CHLORIDE 0.9 % IV SOLN: INTRAVENOUS | Status: DC

## 2019-11-14 MED ORDER — DULOXETINE HCL 60 MG PO CPEP: 60.0000 mg | ORAL_CAPSULE | Freq: Every day | ORAL | Status: DC

## 2019-11-14 MED ORDER — METOCLOPRAMIDE HCL 10 MG PO TABS: 5.0000 mg | ORAL_TABLET | Freq: Three times a day (TID) | ORAL | Status: DC

## 2019-11-14 MED ORDER — ONDANSETRON HCL 4 MG PO TABS: 4.0000 mg | ORAL_TABLET | Freq: Three times a day (TID) | ORAL | Status: DC | PRN

## 2019-11-14 MED ORDER — MAGNESIUM HYDROXIDE 400 MG/5ML PO SUSP: 30.0000 mL | Freq: Every day | ORAL | Status: DC | PRN

## 2019-11-14 MED ORDER — SUVOREXANT 20 MG PO TABS: 20.0000 mg | ORAL_TABLET | Freq: Every day | ORAL | Status: DC

## 2019-11-14 MED ORDER — PREGABALIN 50 MG PO CAPS: 100.0000 mg | ORAL_CAPSULE | Freq: Three times a day (TID) | ORAL | Status: DC

## 2019-11-14 MED ORDER — AMPHETAMINE-DEXTROAMPHETAMINE 5 MG PO TABS: 20.0000 mg | ORAL_TABLET | Freq: Two times a day (BID) | ORAL | Status: DC

## 2019-11-14 MED ORDER — LINACLOTIDE 72 MCG PO CAPS: 72.0000 ug | ORAL_CAPSULE | Freq: Two times a day (BID) | ORAL | Status: DC

## 2019-11-14 NOTE — ED Notes (Signed)
Report off to gracie  rn  

## 2019-11-14 NOTE — ED Notes (Signed)
Pt requesting to leave AMA, this RN attempted to reason with pt regarding necessity to stay. Sharion Settler NP notified and came down to speak with pt. Pt was informed of risks of leaving but pt is still choosing to leave ama. Pt states she will follow up with pcp to schedule VQ scan.

## 2019-11-14 NOTE — Discharge Summary (Addendum)
  Jamie Schultz  is a 59 y.o. Caucasian female with a known history of COPD, fibromyalgia, ADD, IBS and peripheral neuropathy, who presented to the emergency room with acute onset of altered mental status with confusion that she believes could be related to taking Valium and Xanax.  She admits to nausea without vomiting.  She has been having dry cough with associated wheezing without significant dyspnea.  She occasionally expectorates whitish sputum.  She states that she quit smoking cigarettes a couple weeks ago.  No fever or chills.  No chest pain or palpitations.   Upon presentation to the emergency room blood pressure was 137/119 approximately was 84-86% on room air and up to 100% on 3 L of O2 by nasal cannula.  Labs revealed a blood glucose of 168 with unremarkable CMP and CBC showed leukocytosis of 15.5 with neutrophilia and underwent laparotomy and bowel resection about a month ago at [facility].  Abdominal pelvic CT scan revealed: 1. Postsurgical changes at the rectosigmoid colon. Large volume of stool throughout the colon, up to the anastomosis, suggesting constipation. No dilated small bowel to suggest small bowel obstruction. 2. Slightly thick-walled appearance of the urinary bladder, questionable for cystitis 3. Renal cysts, some of these are complex and hyperdense suggesting potential hemorrhage or proteinaceous debris.   Plan for admission for further evaluation and management such as follows:  1.  COPD acute exacerbation with subsequent acute hypoxic respiratory failure. -The patient will be admitted to a medical monitored bed. -She will be placed on scheduled and as needed duo nebs as well as IV steroid therapy with Solu-Medrol. -Mucolytic therapy will be provided. -O2 protocol will be followed. -The patient was ordered a VQ scan that is currently pending.   2.  Acute encephalopathy. -This could be related to hypoxemia and polypharmacy with benzodiazepines.  It is significant  improving.  She is currently close to her baseline. -O2 protocol will be followed. -We will avoid excessive sedation.   3.  Dyslipidemia. -Statin therapy will be resumed.   4.  Irritable bowel syndrome. -We will continue Linzess.   5.  Peripheral neuropathy and fibromyalgia -Lyrica and pain management will be resumed.   6.  DVT prophylaxis. -Subcutaneous Lovenox.  However, while awaiting trnasfer to inpatient bed, patient decided she did not want to be admitted for further treatment and that she would follow up with her primary care physician the following day despite explanations of ned for respiratory support/treatment/supplemntal oxygen.  Patient verbalized the understanding of risk she had but decided to leave against medical advice.  Discharge diagnoses: As above.

## 2020-02-08 IMAGING — CR DG LUMBAR SPINE COMPLETE 4+V
1 series · 5 of 5 positions shown · non-contrast
Comparison: 05/12/2018

CLINICAL DATA: Recent fall with low back pain, initial encounter

EXAM:
LUMBAR SPINE - COMPLETE 4+ VIEW

[Series 1: dg lumbar spine complete 4 +v · 0.14mm/px · 5 of 5 slices shown]
[im 1/5]
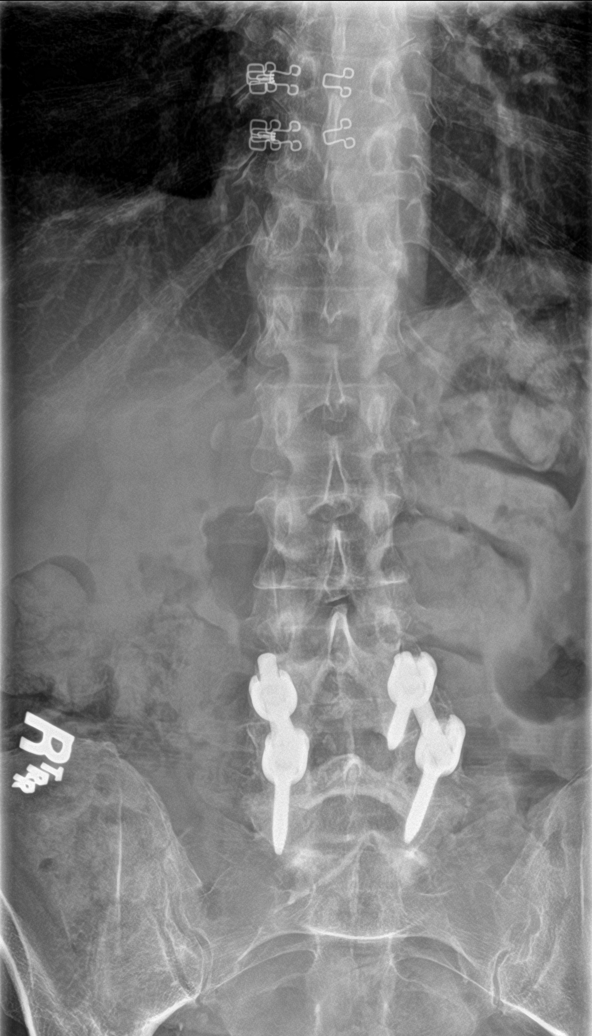
[im 2/5]
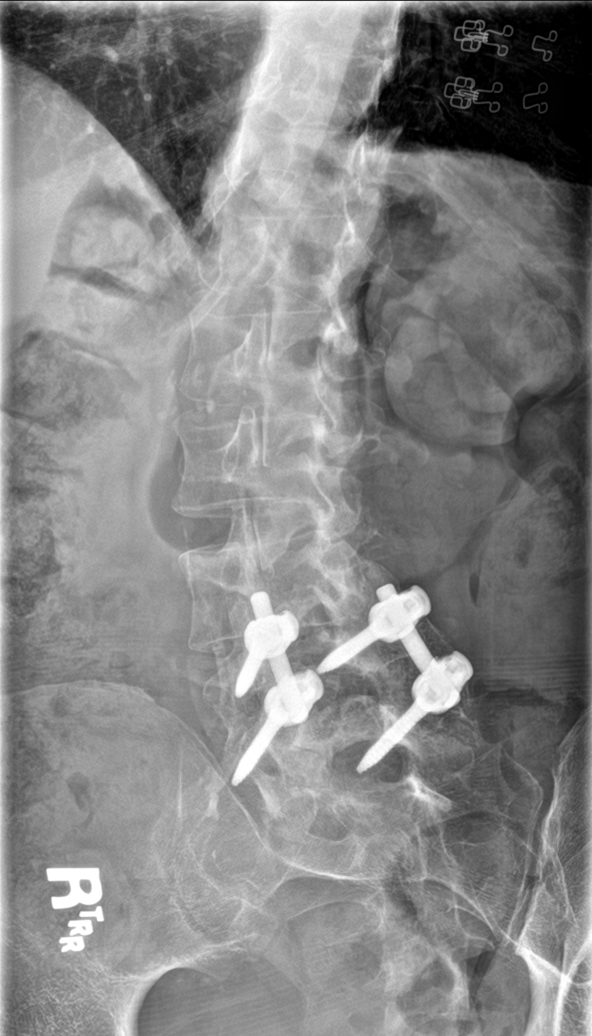
[im 3/5]
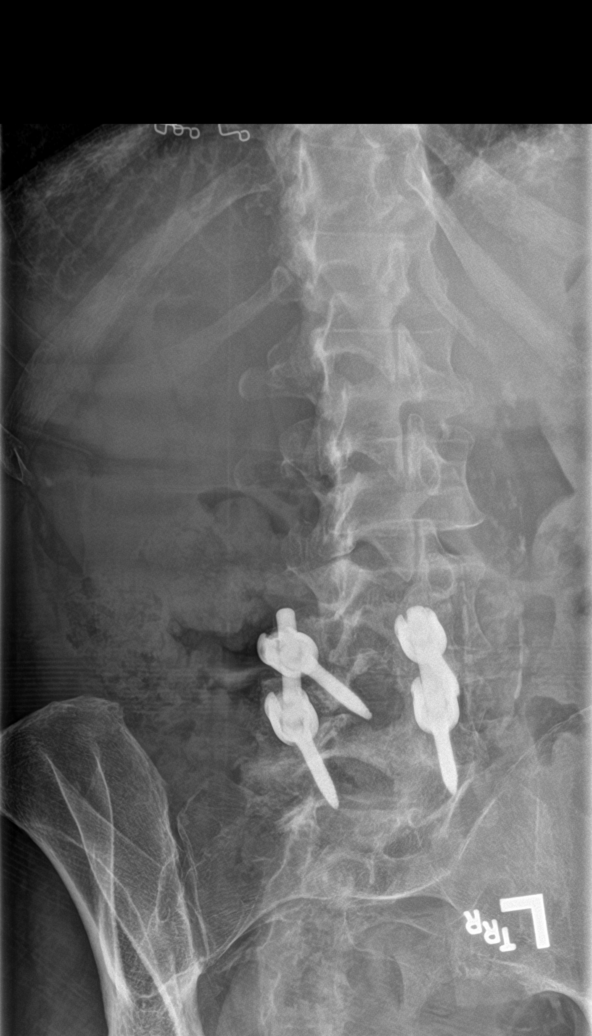
[im 4/5]
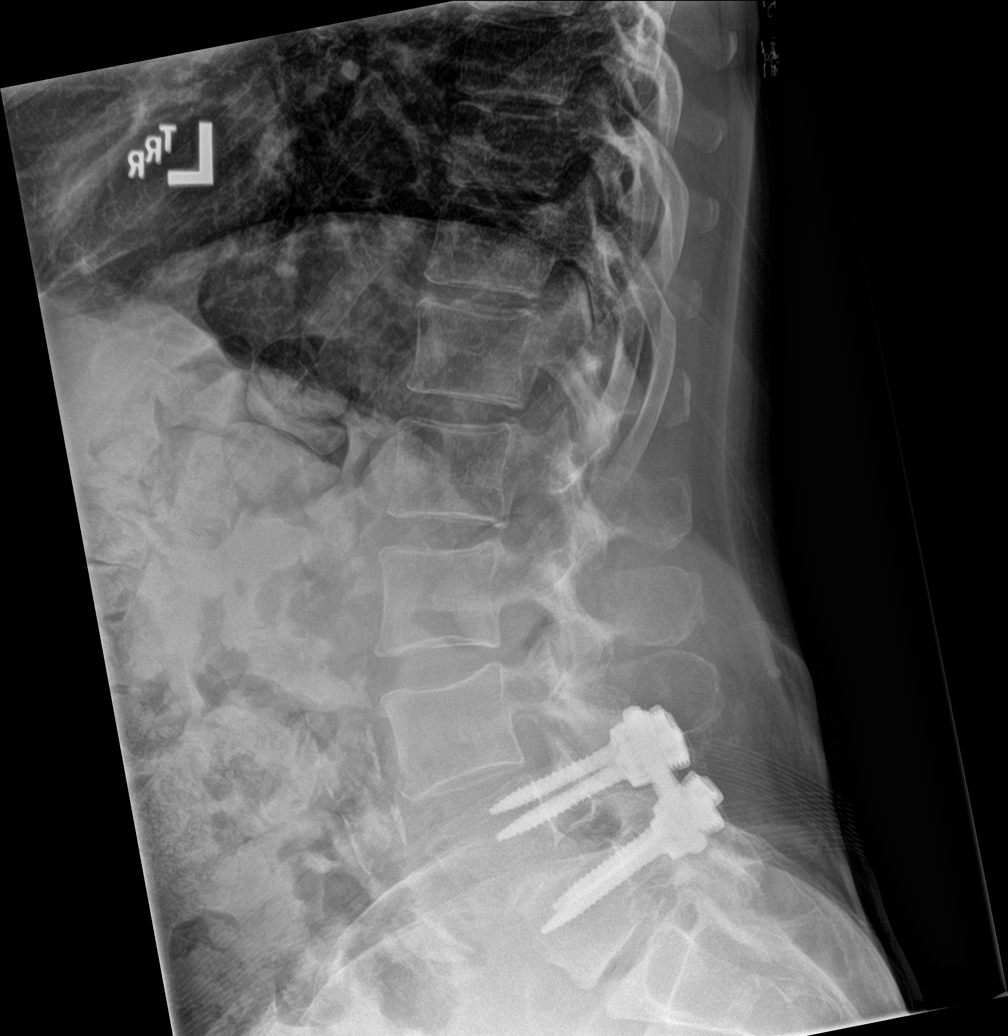
[im 5/5]
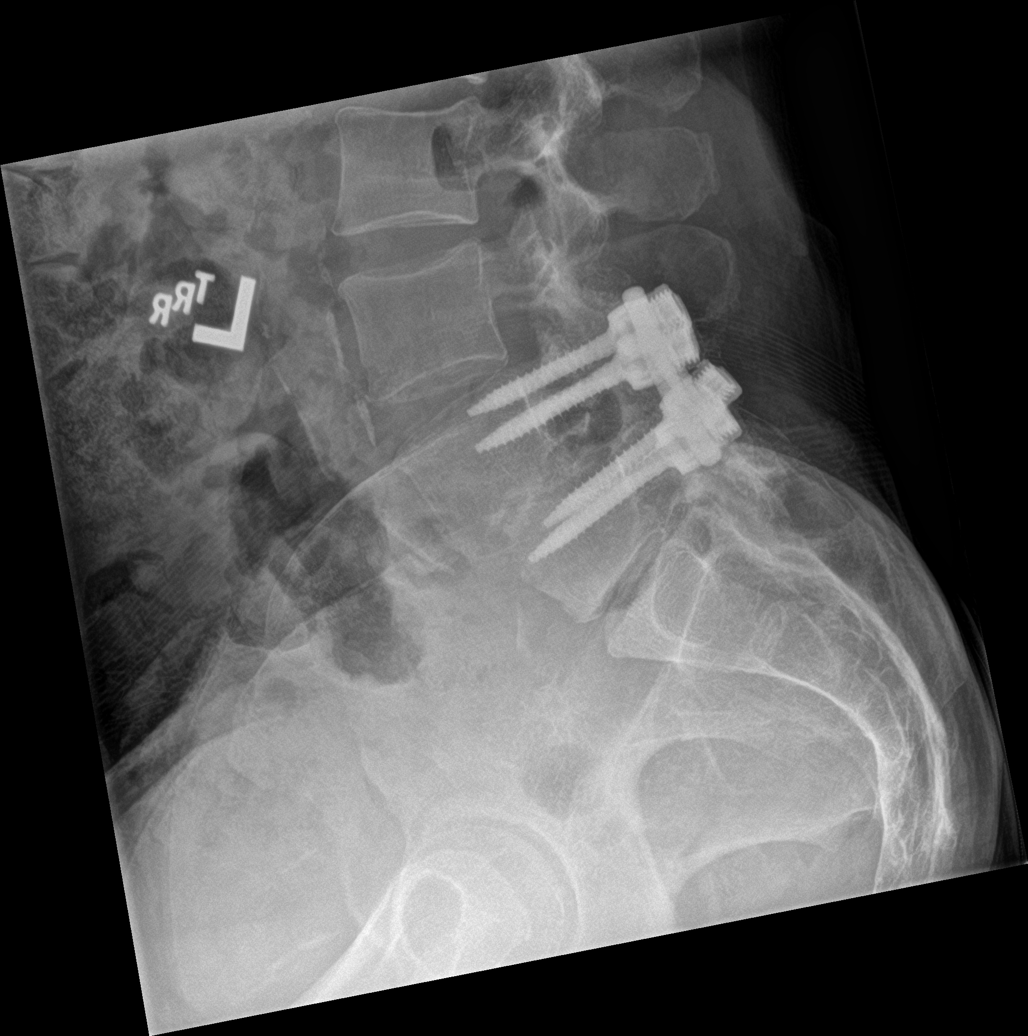

[5 of 5 positions shown; findings below may reference images not displayed]

FINDINGS: Postsurgical changes are noted at L4-5. Stable anterolisthesis of L4
on L5 is noted. No hardware failure is seen. Disc space narrowing at
L5-S1 and L3-L4 is noted. No compression deformities are seen. No
pars defects are noted. Changes of colonic constipation are seen.
IMPRESSION: Postoperative change with anterolisthesis at L4-5 stable from the
prior exam.

Changes consistent with constipation.
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
## 2020-04-15 ENCOUNTER — Encounter: Payer: Self-pay | Admitting: Urology
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
## 2020-07-11 IMAGING — DX DG ABDOMEN 1V
1 series · 1 of 1 positions shown · non-contrast
Comparison: None.

CLINICAL DATA: NG tube placement

EXAM:
ABDOMEN - 1 VIEW

[abdomen kub]
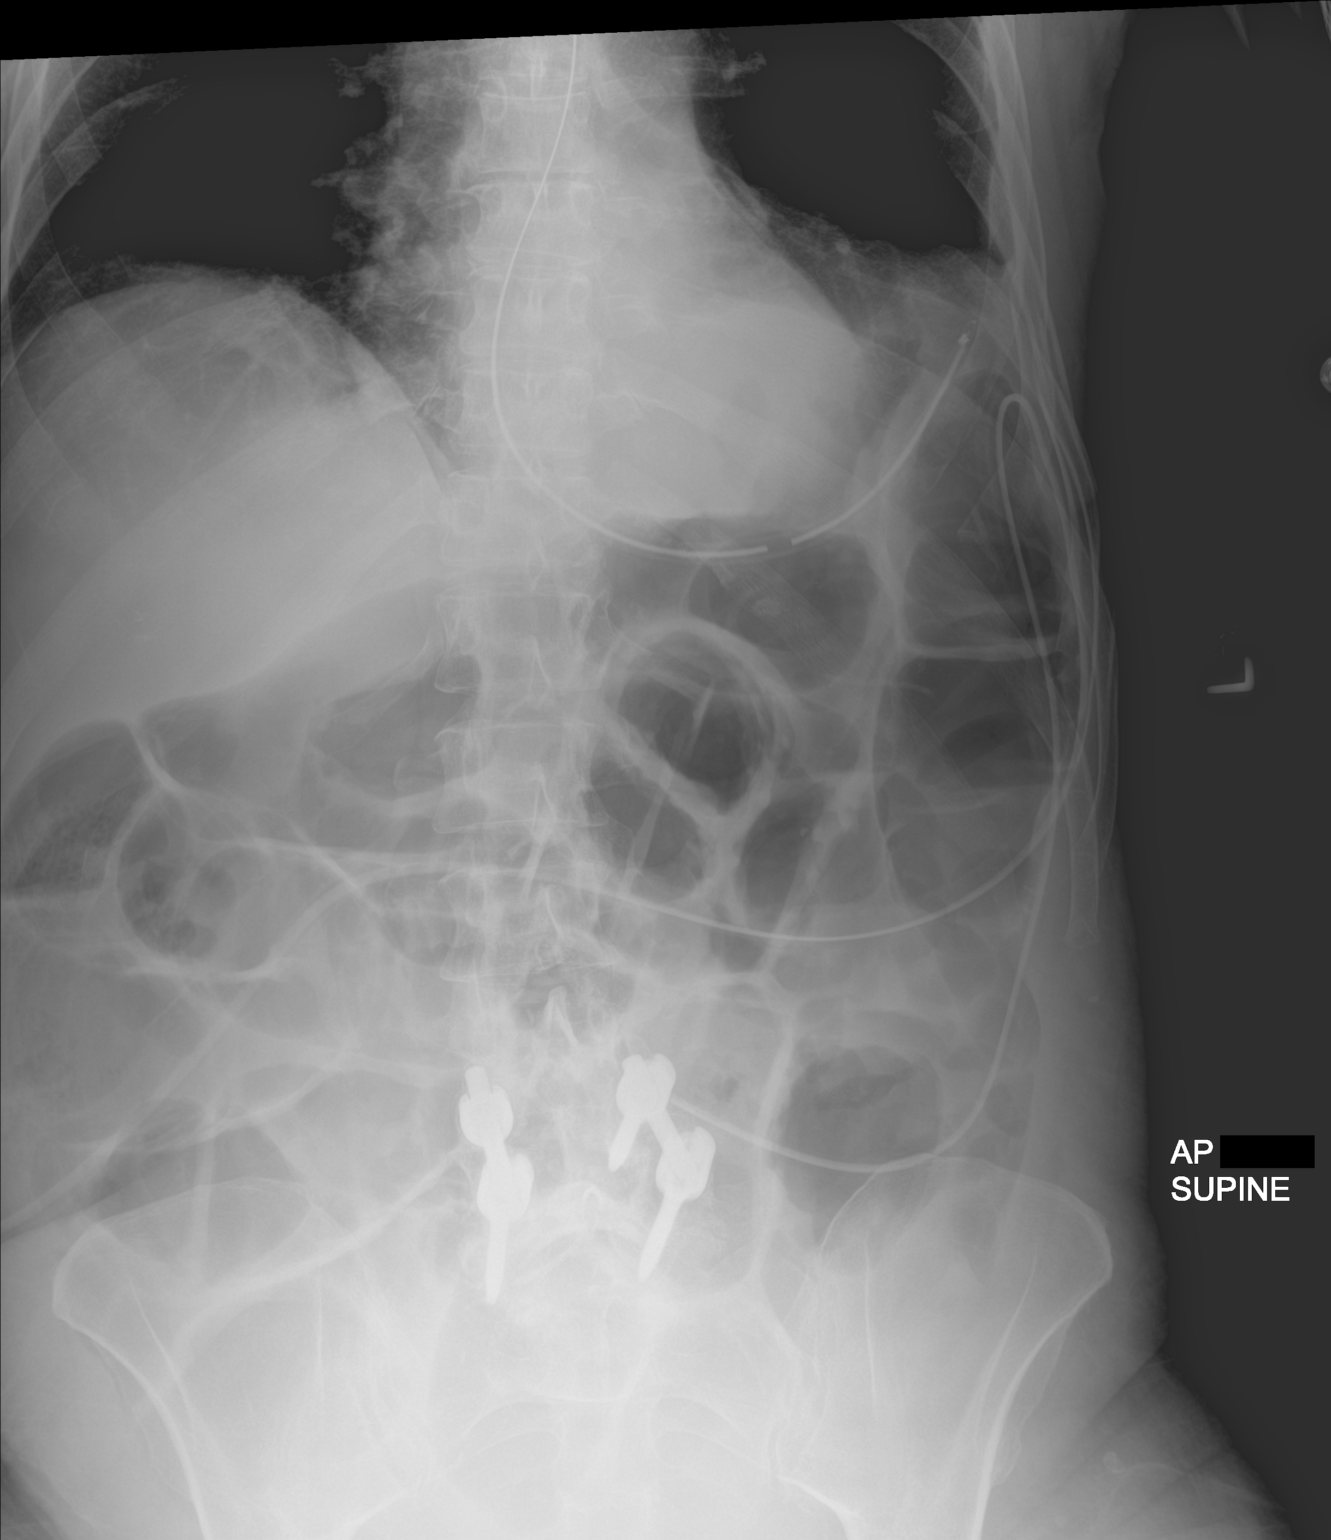

[1 of 1 positions shown; findings below may reference images not displayed]

FINDINGS: The bowel gas pattern is unchanged. No radio-opaque calculi or other
significant radiographic abnormality are seen. NG tube tip and side
port project within the stomach.
IMPRESSION: NG tube tip and side port in the stomach.

## 2020-07-13 IMAGING — CT CT ABD-PELV W/O CM
2 of 4 series · 15 of 46 positions shown, 17 images · non-contrast
Comparison: Abdominal radiograph dated 10/19/2019 and CT pelvis
dated 06/13/2014.

CLINICAL DATA: Abdominal pain. The patient is status post
sigmoidectomy.

EXAM:
CT ABDOMEN AND PELVIS WITHOUT CONTRAST
TECHNIQUE: Multidetector CT imaging of the abdomen and pelvis was performed
following the standard protocol without IV contrast.

[Series 3: axial st · axial · 0.76mm/px · z∈[-431,-16]mm · 12 of 93 slices shown, 14 images]
[im 5/93  soft-tissue]
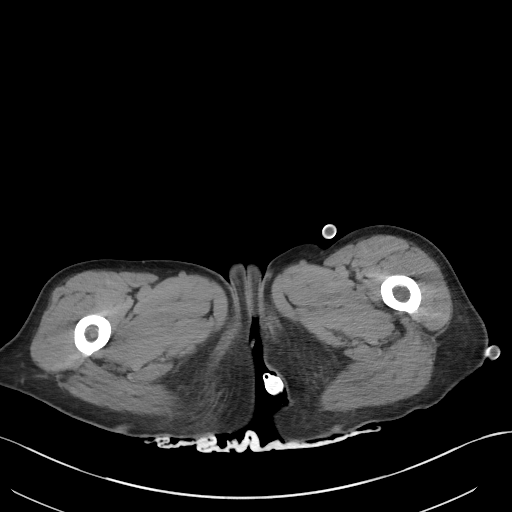
[im 5/93  bone]
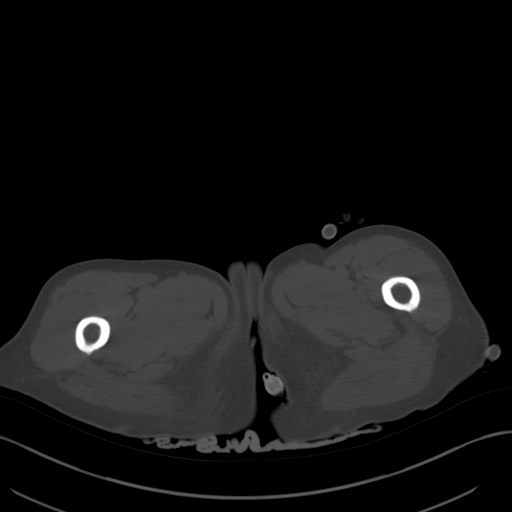
[im 14/93  soft-tissue]
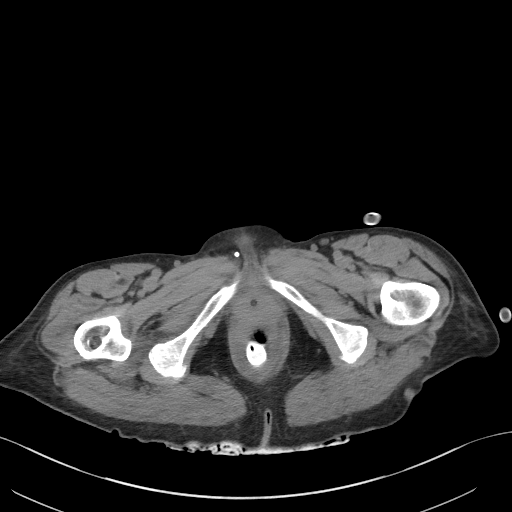
[im 22/93  soft-tissue]
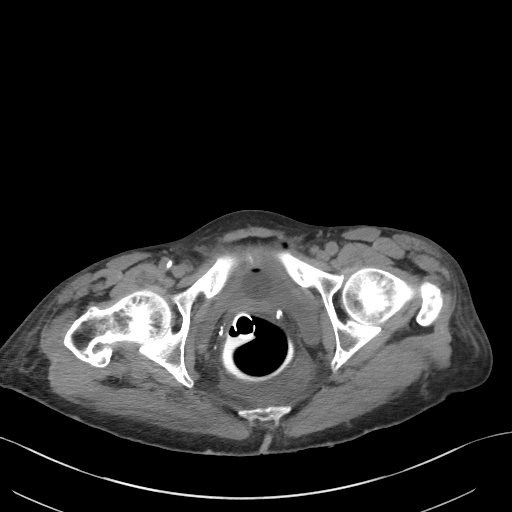
[im 27/93  soft-tissue]
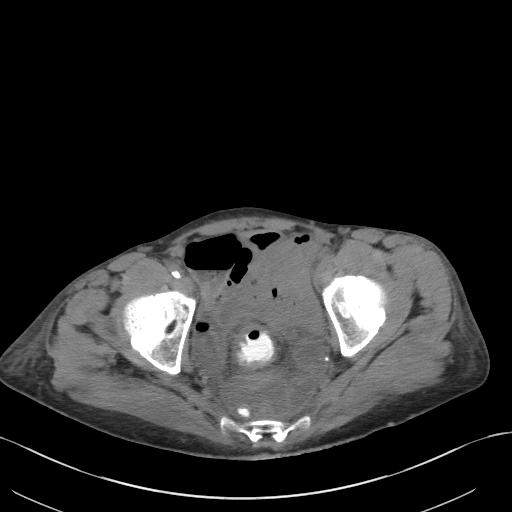
[im 36/93  soft-tissue]
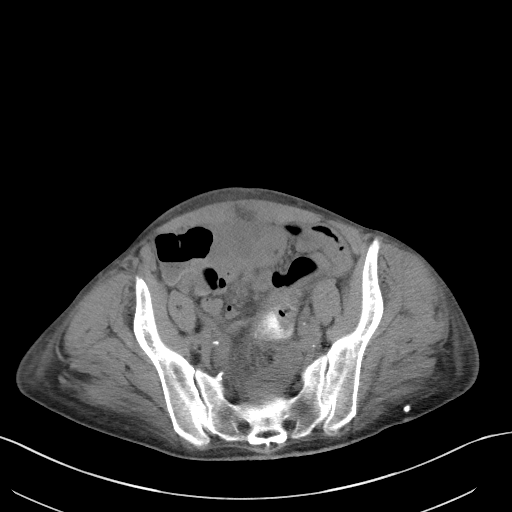
[im 44/93  soft-tissue]
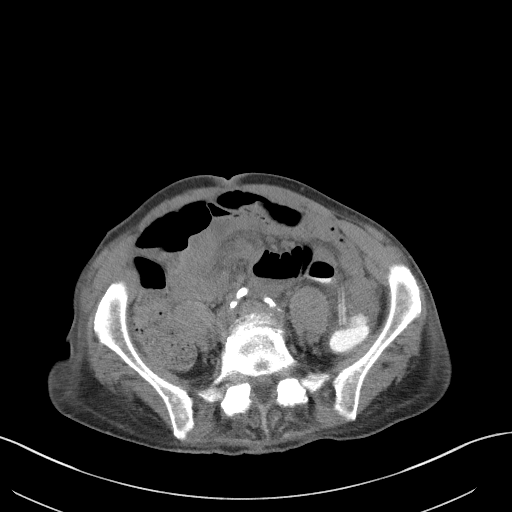
[im 49/93  soft-tissue]
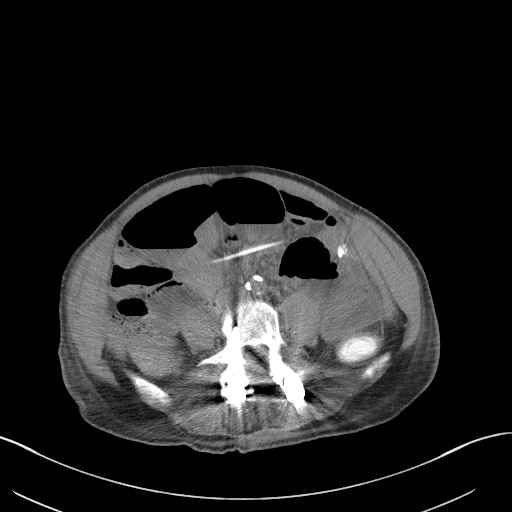
[im 57/93  soft-tissue]
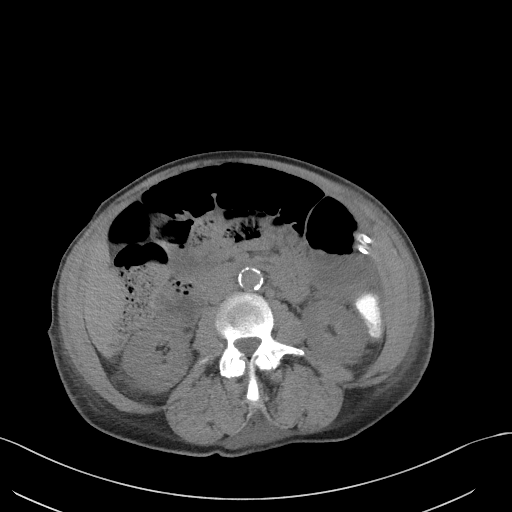
[im 66/93  soft-tissue]
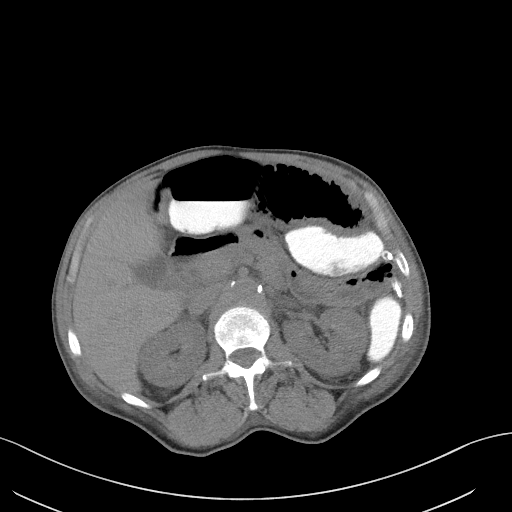
[im 66/93  bone]
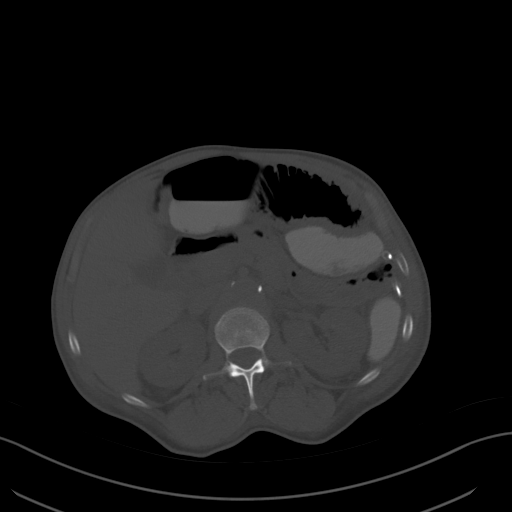
[im 71/93  soft-tissue]
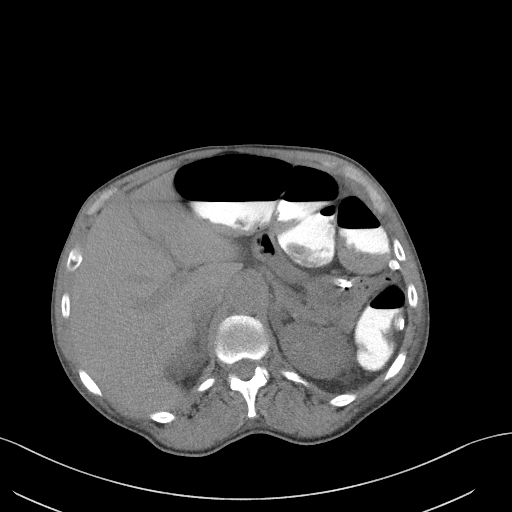
[im 79/93  soft-tissue]
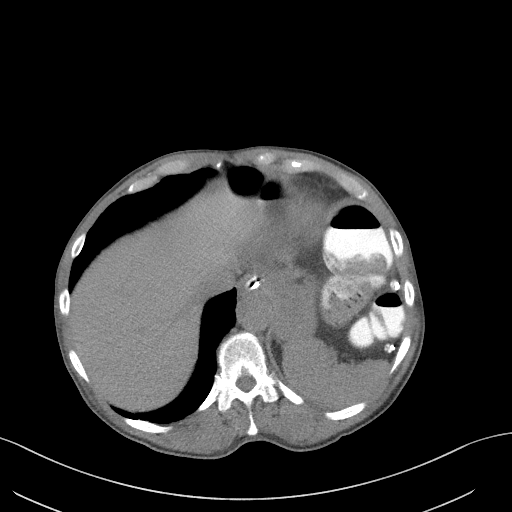
[im 88/93  soft-tissue]
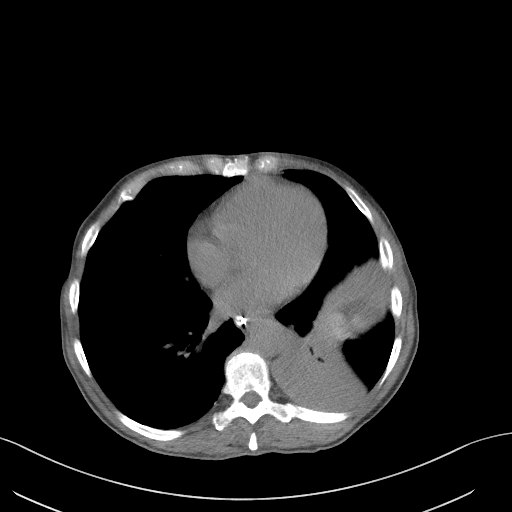

[Series 6: coronal st · coronal · 0.61mm/px · 3 of 82 slices shown]
[im 28/82  soft-tissue]
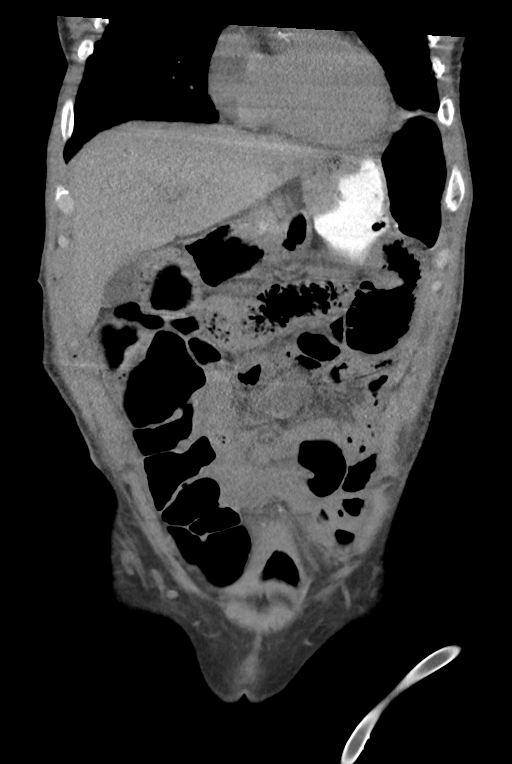
[im 37/82  soft-tissue]
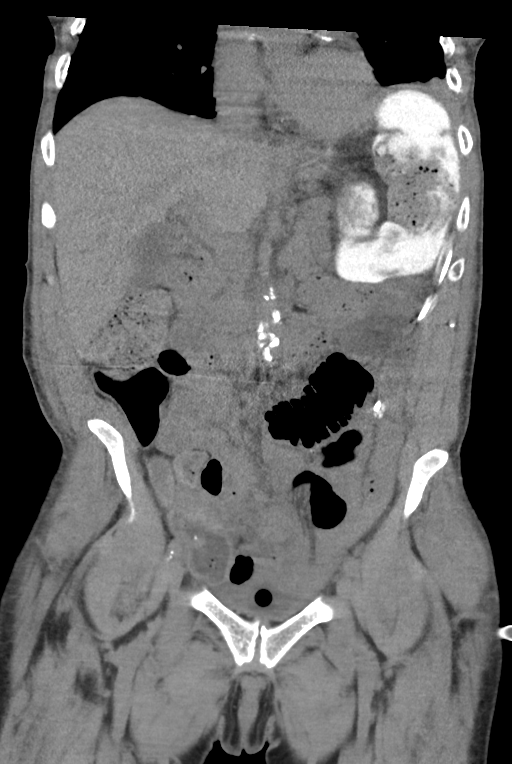
[im 46/82  soft-tissue]
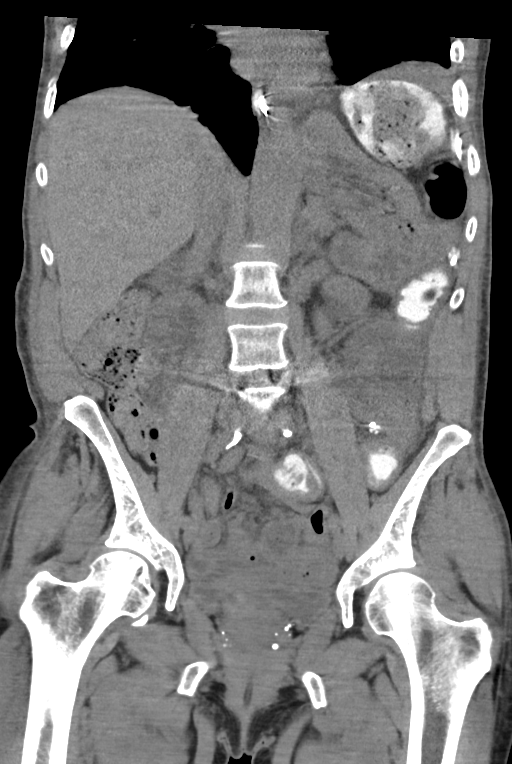

[15 of 46 positions shown; findings below may reference images not displayed]

FINDINGS: Lower chest: There is moderate left lower lobe and mild right lower
lobe atelectasis.

Hepatobiliary: No focal liver abnormality is seen. No gallstones,
gallbladder wall thickening, or biliary dilatation.

Pancreas: Unremarkable. No pancreatic ductal dilatation or
surrounding inflammatory changes.

Spleen: Normal in size without focal abnormality.

Adrenals/Urinary Tract: Adrenal glands are unremarkable. Other than
bilateral renal cysts measuring up to 2 cm, the kidneys are normal,
without renal calculi, focal lesion, or hydronephrosis. Bladder is
decompressed with a Foley catheter in place.

Stomach/Bowel: Stomach is within normal limits. An enteric tube
terminates in the stomach. The patient is status post sigmoidectomy
and rectopexy with surgical anastomosis in the upper pelvis. A
rectal tube is in place with enteric contrast extending from the
rectum to the transverse colon. There is no contrast extravasation
from the bowel lumen to suggest anastomotic leak. A surgical drain
is looped through the mid abdomen and left abdomen. A few mildly
dilated loops of small bowel are seen in the mid abdomen without a
definite discrete transition point to suggest high-grade bowel
obstruction. No pericecal inflammatory changes are noted to suggest
acute appendicitis. No evidence of bowel wall thickening,
distention, or inflammatory changes.

Vascular/Lymphatic: Aortic atherosclerosis. No enlarged abdominal or
pelvic lymph nodes.

Reproductive: Status post hysterectomy. No adnexal masses.

Other: No abdominal wall hernia. A few locules of gas in the
subcutaneous fat of the left lower quadrant are likely
postoperative. No abdominopelvic ascites.

Musculoskeletal: Fixation hardware is seen at L4-L5.
IMPRESSION: 1. Status post sigmoidectomy and rectopexy with surgical anastomosis
in the upper pelvis. No contrast extravasation from the bowel lumen
to suggest anastomotic leak.
2. A few mildly dilated loops of small bowel are seen in the mid
abdomen without a definite discrete transition point to suggest
high-grade bowel obstruction. This favored to reflect postoperative
ileus.
3. Moderate left lower lobe and mild right lower lobe atelectasis.

Aortic Atherosclerosis ([id number]).

## 2020-07-15 IMAGING — DX DG ABDOMEN 1V
1 series · 1 of 1 positions shown · non-contrast
Comparison: Radiograph 10/19/2019, CT 10/21/2019

CLINICAL DATA: NG tube placement

EXAM:
ABDOMEN - 1 VIEW

[abdomen kub]
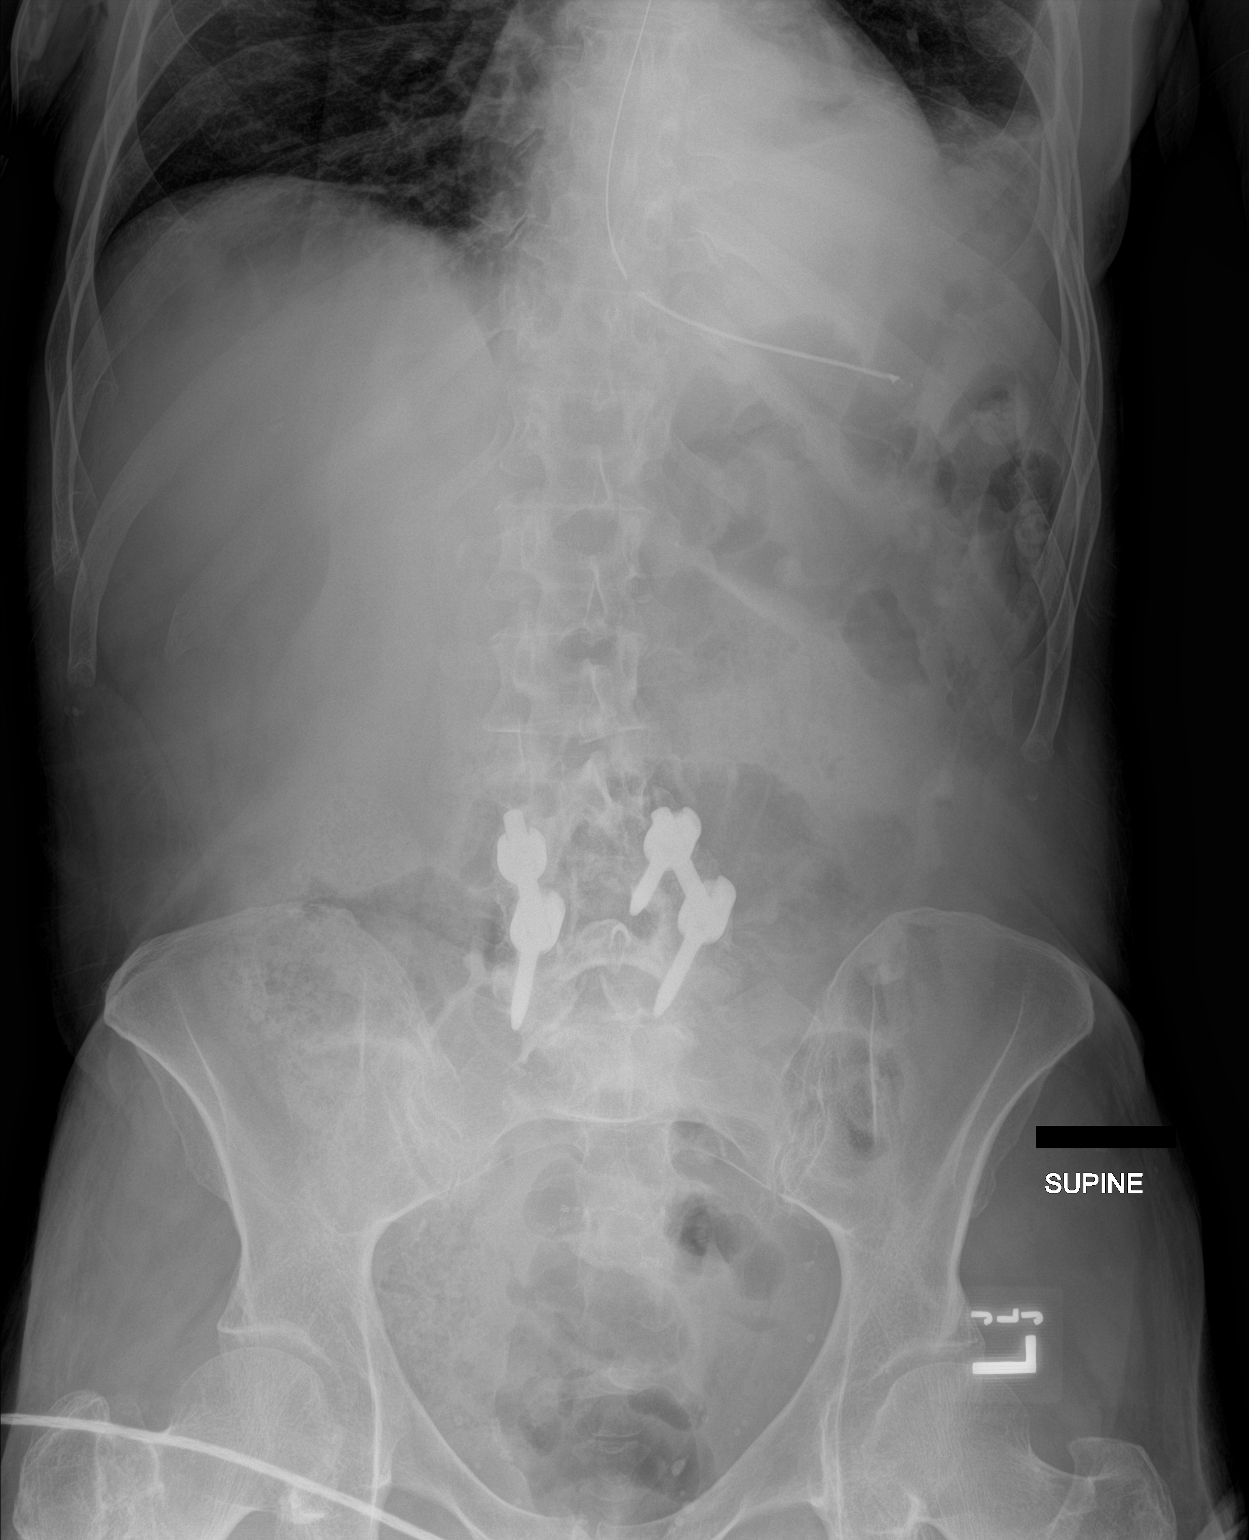

[1 of 1 positions shown; findings below may reference images not displayed]

FINDINGS: There is elevation of the left hemidiaphragm with atelectatic
changes in the left lung base. The tip of the transesophageal tube
terminates in the left upper quadrant however the side port remains
at the GE junction and should be advanced 3-5 cm for optimal
functioning. Few borderline air distended loops of small bowel are
again seen in the mid to lower abdomen. No suspicious
calcifications. Lower lumbar fusion hardware is again noted. No
acute osseous or soft tissue abnormality. Multilevel degenerative
changes are present in the imaged portions of the spine.
IMPRESSION: The tip of the transesophageal tube terminates in the left upper
quadrant however the side port remains at the GE junction and should
be advanced 3-5 cm for optimal functioning.

Persistent air-filled loops of small bowel, likely reflecting
postoperative ileus.
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
## 2020-12-22 ENCOUNTER — Other Ambulatory Visit: Payer: Self-pay

## 2020-12-22 ENCOUNTER — Emergency Department
Admission: EM | Admit: 2020-12-22 | Discharge: 2020-12-22 | Disposition: A | Discharge status: Home / Self Care | Payer: Medicaid Other | Attending: Emergency Medicine | Admitting: Emergency Medicine

## 2020-12-22 ENCOUNTER — Emergency Department: Payer: Medicaid Other

## 2020-12-22 DIAGNOSIS — R52 Pain, unspecified: Secondary | ICD-10-CM

## 2020-12-22 DIAGNOSIS — Z87891 Personal history of nicotine dependence: Secondary | ICD-10-CM | POA: Diagnosis not present

## 2020-12-22 DIAGNOSIS — Y9301 Activity, walking, marching and hiking: Secondary | ICD-10-CM | POA: Insufficient documentation

## 2020-12-22 DIAGNOSIS — S92354A Nondisplaced fracture of fifth metatarsal bone, right foot, initial encounter for closed fracture: Secondary | ICD-10-CM

## 2020-12-22 DIAGNOSIS — Z79899 Other long term (current) drug therapy: Secondary | ICD-10-CM | POA: Diagnosis not present

## 2020-12-22 DIAGNOSIS — J449 Chronic obstructive pulmonary disease, unspecified: Secondary | ICD-10-CM | POA: Insufficient documentation

## 2020-12-22 DIAGNOSIS — Z96612 Presence of left artificial shoulder joint: Secondary | ICD-10-CM | POA: Insufficient documentation

## 2020-12-22 DIAGNOSIS — Z96651 Presence of right artificial knee joint: Secondary | ICD-10-CM | POA: Insufficient documentation

## 2020-12-22 DIAGNOSIS — X501XXA Overexertion from prolonged static or awkward postures, initial encounter: Secondary | ICD-10-CM | POA: Diagnosis not present

## 2020-12-22 DIAGNOSIS — S92351A Displaced fracture of fifth metatarsal bone, right foot, initial encounter for closed fracture: Secondary | ICD-10-CM | POA: Insufficient documentation

## 2020-12-22 DIAGNOSIS — S99921A Unspecified injury of right foot, initial encounter: Secondary | ICD-10-CM | POA: Diagnosis present

## 2020-12-22 DIAGNOSIS — E039 Hypothyroidism, unspecified: Secondary | ICD-10-CM | POA: Insufficient documentation

## 2020-12-22 MED ORDER — OXYCODONE HCL 5 MG PO TABS
5.0000 mg | ORAL_TABLET | Freq: Once | ORAL | Status: AC
  Administered 2020-12-22: 5 mg via ORAL
  Filled 2020-12-22: qty 1

## 2020-12-22 MED ORDER — OXYCODONE-ACETAMINOPHEN 5-325 MG PO TABS
1.0000 | ORAL_TABLET | Freq: Once | ORAL | Status: DC
  Filled 2020-12-22: qty 1

## 2020-12-22 MED ORDER — KETOROLAC TROMETHAMINE 60 MG/2ML IM SOLN
15.0000 mg | Freq: Once | INTRAMUSCULAR | Status: AC
  Administered 2020-12-22: 15 mg via INTRAMUSCULAR
  Filled 2020-12-22: qty 2

## 2020-12-22 MED ORDER — ACETAMINOPHEN 325 MG PO TABS
650.0000 mg | ORAL_TABLET | Freq: Once | ORAL | Status: DC
  Filled 2020-12-22: qty 2
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
## 2020-12-22 NOTE — Discharge Instructions (Addendum)
Please use the crutches and boot for your right foot. Follow up with your primary care if you develop any other symptoms related to the fever. Follow up with ortho regarding your right foot. Return to ER with any worsening.

## 2020-12-22 NOTE — ED Triage Notes (Signed)
Pt states last night she twisted her right ankle 3 times after tripping on her dress- pt right foot wrapped in ace wrap, able to wriggle toes

## 2020-12-29 ENCOUNTER — Encounter: Payer: Self-pay | Admitting: Emergency Medicine

## 2020-12-29 ENCOUNTER — Inpatient Hospital Stay
Admission: EM | Admit: 2020-12-29 | Discharge: 2021-01-01 | DRG: 571 | Disposition: A | Discharge status: Home Health | Payer: Medicaid Other | Attending: Internal Medicine | Admitting: Internal Medicine

## 2020-12-29 ENCOUNTER — Other Ambulatory Visit: Payer: Self-pay

## 2020-12-29 ENCOUNTER — Emergency Department: Payer: Medicaid Other

## 2020-12-29 DIAGNOSIS — J439 Emphysema, unspecified: Secondary | ICD-10-CM | POA: Diagnosis present

## 2020-12-29 DIAGNOSIS — L02612 Cutaneous abscess of left foot: Secondary | ICD-10-CM | POA: Diagnosis present

## 2020-12-29 DIAGNOSIS — F3289 Other specified depressive episodes: Secondary | ICD-10-CM | POA: Diagnosis not present

## 2020-12-29 DIAGNOSIS — L089 Local infection of the skin and subcutaneous tissue, unspecified: Secondary | ICD-10-CM | POA: Diagnosis not present

## 2020-12-29 DIAGNOSIS — L03119 Cellulitis of unspecified part of limb: Secondary | ICD-10-CM | POA: Diagnosis present

## 2020-12-29 DIAGNOSIS — Z96619 Presence of unspecified artificial shoulder joint: Secondary | ICD-10-CM

## 2020-12-29 DIAGNOSIS — I700 Atherosclerosis of aorta: Secondary | ICD-10-CM | POA: Diagnosis present

## 2020-12-29 DIAGNOSIS — K5903 Drug induced constipation: Secondary | ICD-10-CM | POA: Diagnosis present

## 2020-12-29 DIAGNOSIS — F3341 Major depressive disorder, recurrent, in partial remission: Secondary | ICD-10-CM | POA: Diagnosis present

## 2020-12-29 DIAGNOSIS — M71072 Abscess of bursa, left ankle and foot: Secondary | ICD-10-CM | POA: Diagnosis not present

## 2020-12-29 DIAGNOSIS — Z8249 Family history of ischemic heart disease and other diseases of the circulatory system: Secondary | ICD-10-CM

## 2020-12-29 DIAGNOSIS — G894 Chronic pain syndrome: Secondary | ICD-10-CM | POA: Diagnosis present

## 2020-12-29 DIAGNOSIS — M4802 Spinal stenosis, cervical region: Secondary | ICD-10-CM | POA: Diagnosis present

## 2020-12-29 DIAGNOSIS — M25512 Pain in left shoulder: Secondary | ICD-10-CM | POA: Diagnosis present

## 2020-12-29 DIAGNOSIS — D649 Anemia, unspecified: Secondary | ICD-10-CM | POA: Diagnosis not present

## 2020-12-29 DIAGNOSIS — L02619 Cutaneous abscess of unspecified foot: Secondary | ICD-10-CM | POA: Diagnosis not present

## 2020-12-29 DIAGNOSIS — Z885 Allergy status to narcotic agent status: Secondary | ICD-10-CM

## 2020-12-29 DIAGNOSIS — Z96659 Presence of unspecified artificial knee joint: Secondary | ICD-10-CM

## 2020-12-29 DIAGNOSIS — M5442 Lumbago with sciatica, left side: Secondary | ICD-10-CM | POA: Diagnosis not present

## 2020-12-29 DIAGNOSIS — M797 Fibromyalgia: Secondary | ICD-10-CM | POA: Diagnosis present

## 2020-12-29 DIAGNOSIS — Z28311 Partially vaccinated for covid-19: Secondary | ICD-10-CM

## 2020-12-29 DIAGNOSIS — Z20822 Contact with and (suspected) exposure to covid-19: Secondary | ICD-10-CM | POA: Diagnosis present

## 2020-12-29 DIAGNOSIS — E785 Hyperlipidemia, unspecified: Secondary | ICD-10-CM | POA: Diagnosis present

## 2020-12-29 DIAGNOSIS — Y92019 Unspecified place in single-family (private) house as the place of occurrence of the external cause: Secondary | ICD-10-CM

## 2020-12-29 DIAGNOSIS — Z79890 Hormone replacement therapy (postmenopausal): Secondary | ICD-10-CM

## 2020-12-29 DIAGNOSIS — J449 Chronic obstructive pulmonary disease, unspecified: Secondary | ICD-10-CM | POA: Diagnosis present

## 2020-12-29 DIAGNOSIS — Z825 Family history of asthma and other chronic lower respiratory diseases: Secondary | ICD-10-CM

## 2020-12-29 DIAGNOSIS — Z981 Arthrodesis status: Secondary | ICD-10-CM

## 2020-12-29 DIAGNOSIS — L02416 Cutaneous abscess of left lower limb: Principal | ICD-10-CM | POA: Diagnosis present

## 2020-12-29 DIAGNOSIS — T402X5A Adverse effect of other opioids, initial encounter: Secondary | ICD-10-CM | POA: Diagnosis present

## 2020-12-29 DIAGNOSIS — J432 Centrilobular emphysema: Secondary | ICD-10-CM | POA: Diagnosis not present

## 2020-12-29 DIAGNOSIS — G4700 Insomnia, unspecified: Secondary | ICD-10-CM | POA: Diagnosis present

## 2020-12-29 DIAGNOSIS — I96 Gangrene, not elsewhere classified: Secondary | ICD-10-CM | POA: Diagnosis present

## 2020-12-29 DIAGNOSIS — D72828 Other elevated white blood cell count: Secondary | ICD-10-CM | POA: Diagnosis not present

## 2020-12-29 DIAGNOSIS — Z888 Allergy status to other drugs, medicaments and biological substances status: Secondary | ICD-10-CM

## 2020-12-29 DIAGNOSIS — F419 Anxiety disorder, unspecified: Secondary | ICD-10-CM | POA: Diagnosis present

## 2020-12-29 DIAGNOSIS — G8929 Other chronic pain: Secondary | ICD-10-CM | POA: Diagnosis present

## 2020-12-29 DIAGNOSIS — B9689 Other specified bacterial agents as the cause of diseases classified elsewhere: Secondary | ICD-10-CM | POA: Diagnosis not present

## 2020-12-29 DIAGNOSIS — G629 Polyneuropathy, unspecified: Secondary | ICD-10-CM | POA: Diagnosis present

## 2020-12-29 DIAGNOSIS — Z823 Family history of stroke: Secondary | ICD-10-CM

## 2020-12-29 DIAGNOSIS — Z79891 Long term (current) use of opiate analgesic: Secondary | ICD-10-CM

## 2020-12-29 DIAGNOSIS — Z91041 Radiographic dye allergy status: Secondary | ICD-10-CM

## 2020-12-29 DIAGNOSIS — E034 Atrophy of thyroid (acquired): Secondary | ICD-10-CM | POA: Diagnosis not present

## 2020-12-29 DIAGNOSIS — M545 Low back pain, unspecified: Secondary | ICD-10-CM | POA: Diagnosis present

## 2020-12-29 DIAGNOSIS — M7989 Other specified soft tissue disorders: Secondary | ICD-10-CM | POA: Diagnosis present

## 2020-12-29 DIAGNOSIS — L03115 Cellulitis of right lower limb: Secondary | ICD-10-CM

## 2020-12-29 DIAGNOSIS — A419 Sepsis, unspecified organism: Secondary | ICD-10-CM

## 2020-12-29 DIAGNOSIS — L03116 Cellulitis of left lower limb: Secondary | ICD-10-CM | POA: Diagnosis present

## 2020-12-29 DIAGNOSIS — Z96652 Presence of left artificial knee joint: Secondary | ICD-10-CM | POA: Diagnosis present

## 2020-12-29 DIAGNOSIS — Z880 Allergy status to penicillin: Secondary | ICD-10-CM

## 2020-12-29 DIAGNOSIS — Z96612 Presence of left artificial shoulder joint: Secondary | ICD-10-CM

## 2020-12-29 DIAGNOSIS — Z79899 Other long term (current) drug therapy: Secondary | ICD-10-CM

## 2020-12-29 DIAGNOSIS — K219 Gastro-esophageal reflux disease without esophagitis: Secondary | ICD-10-CM | POA: Diagnosis present

## 2020-12-29 DIAGNOSIS — B999 Unspecified infectious disease: Secondary | ICD-10-CM | POA: Diagnosis not present

## 2020-12-29 DIAGNOSIS — T7589XA Other specified effects of external causes, initial encounter: Secondary | ICD-10-CM | POA: Diagnosis present

## 2020-12-29 DIAGNOSIS — E039 Hypothyroidism, unspecified: Secondary | ICD-10-CM | POA: Diagnosis present

## 2020-12-29 DIAGNOSIS — Z91030 Bee allergy status: Secondary | ICD-10-CM

## 2020-12-29 DIAGNOSIS — L97829 Non-pressure chronic ulcer of other part of left lower leg with unspecified severity: Secondary | ICD-10-CM | POA: Diagnosis not present

## 2020-12-29 DIAGNOSIS — Z801 Family history of malignant neoplasm of trachea, bronchus and lung: Secondary | ICD-10-CM

## 2020-12-29 DIAGNOSIS — D75838 Other thrombocytosis: Secondary | ICD-10-CM | POA: Diagnosis present

## 2020-12-29 DIAGNOSIS — Z882 Allergy status to sulfonamides status: Secondary | ICD-10-CM

## 2020-12-29 DIAGNOSIS — F909 Attention-deficit hyperactivity disorder, unspecified type: Secondary | ICD-10-CM | POA: Diagnosis present

## 2020-12-29 DIAGNOSIS — Z5329 Procedure and treatment not carried out because of patient's decision for other reasons: Secondary | ICD-10-CM | POA: Diagnosis present

## 2020-12-29 DIAGNOSIS — Z87891 Personal history of nicotine dependence: Secondary | ICD-10-CM

## 2020-12-29 DIAGNOSIS — M25561 Pain in right knee: Secondary | ICD-10-CM | POA: Diagnosis not present

## 2020-12-29 DIAGNOSIS — Z886 Allergy status to analgesic agent status: Secondary | ICD-10-CM

## 2020-12-29 DIAGNOSIS — D75839 Thrombocytosis, unspecified: Secondary | ICD-10-CM | POA: Diagnosis not present

## 2020-12-29 DIAGNOSIS — M25511 Pain in right shoulder: Secondary | ICD-10-CM | POA: Diagnosis not present

## 2020-12-29 DIAGNOSIS — M25562 Pain in left knee: Secondary | ICD-10-CM | POA: Diagnosis not present

## 2020-12-29 LAB — COMPREHENSIVE METABOLIC PANEL
ALT: 42 U/L (ref 0–44)
AST: 30 U/L (ref 15–41)
Albumin: 2.4 g/dL — ABNORMAL LOW (ref 3.5–5.0)
Alkaline Phosphatase: 167 U/L — ABNORMAL HIGH (ref 38–126)
Anion gap: 8 (ref 5–15)
BUN: 15 mg/dL (ref 8–23)
CO2: 30 mmol/L (ref 22–32)
Calcium: 8.6 mg/dL — ABNORMAL LOW (ref 8.9–10.3)
Chloride: 97 mmol/L — ABNORMAL LOW (ref 98–111)
Creatinine, Ser: 0.62 mg/dL (ref 0.44–1.00)
GFR, Estimated: >60 mL/min (ref >60)
Glucose, Bld: 121 mg/dL — ABNORMAL HIGH (ref 70–99)
Potassium: 4.2 mmol/L (ref 3.5–5.1)
Sodium: 135 mmol/L (ref 135–145)
Total Bilirubin: 0.5 mg/dL (ref 0.3–1.2)
Total Protein: 5.9 g/dL — ABNORMAL LOW (ref 6.5–8.1)

## 2020-12-29 LAB — CBC WITH DIFFERENTIAL/PLATELET
Abs Immature Granulocytes: 0.33 10*3/uL — ABNORMAL HIGH (ref 0.00–0.07)
Basophils Absolute: 0.1 10*3/uL (ref 0.0–0.1)
Basophils Relative: 0 %
Eosinophils Absolute: 0.2 10*3/uL (ref 0.0–0.5)
Eosinophils Relative: 1 %
HCT: 33.3 % — ABNORMAL LOW (ref 36.0–46.0)
Hemoglobin: 11.1 g/dL — ABNORMAL LOW (ref 12.0–15.0)
Immature Granulocytes: 2 %
Lymphocytes Relative: 7 %
Lymphs Abs: 1.5 10*3/uL (ref 0.7–4.0)
MCH: 30 pg (ref 26.0–34.0)
MCHC: 33.3 g/dL (ref 30.0–36.0)
MCV: 90 fL (ref 80.0–100.0)
Monocytes Absolute: 1.8 10*3/uL — ABNORMAL HIGH (ref 0.1–1.0)
Monocytes Relative: 8 %
Neutro Abs: 17.6 10*3/uL — ABNORMAL HIGH (ref 1.7–7.7)
Neutrophils Relative %: 82 %
Platelets: 363 10*3/uL (ref 150–400)
RBC: 3.7 MIL/uL — ABNORMAL LOW (ref 3.87–5.11)
RDW: 14.6 % (ref 11.5–15.5)
WBC: 21.5 10*3/uL — ABNORMAL HIGH (ref 4.0–10.5)
nRBC: 0 % (ref 0.0–0.2)

## 2020-12-29 LAB — PROTIME-INR
INR: 1 (ref 0.8–1.2)
Prothrombin Time: 13.4 seconds (ref 11.4–15.2)

## 2020-12-29 LAB — LACTIC ACID, PLASMA
Lactic Acid, Venous: 1.1 mmol/L (ref 0.5–1.9)
Lactic Acid, Venous: 1 mmol/L (ref 0.5–1.9)

## 2020-12-29 MED ORDER — PRAVASTATIN SODIUM 20 MG PO TABS
20.0000 mg | ORAL_TABLET | Freq: Every day | ORAL | Status: DC
  Administered 2020-12-29 – 2021-01-01 (×4): 20 mg via ORAL
  Filled 2020-12-29 (×3): qty 1

## 2020-12-29 MED ORDER — EPINEPHRINE 0.3 MG/0.3ML IJ SOAJ: 0.3000 mg | INTRAMUSCULAR | Status: DC | PRN

## 2020-12-29 MED ORDER — ONDANSETRON HCL 4 MG PO TABS: 4.0000 mg | ORAL_TABLET | Freq: Four times a day (QID) | ORAL | Status: DC | PRN

## 2020-12-29 MED ORDER — ACETAMINOPHEN 650 MG RE SUPP: 650.0000 mg | Freq: Four times a day (QID) | RECTAL | Status: DC | PRN

## 2020-12-29 MED ORDER — PREGABALIN 50 MG PO CAPS
100.0000 mg | ORAL_CAPSULE | Freq: Three times a day (TID) | ORAL | Status: DC
  Administered 2020-12-29 – 2021-01-01 (×8): 100 mg via ORAL
  Filled 2020-12-29 (×8): qty 2

## 2020-12-29 MED ORDER — DIPHENHYDRAMINE HCL 50 MG/ML IJ SOLN: 50.0000 mg | Freq: Once | INTRAMUSCULAR | Status: AC

## 2020-12-29 MED ORDER — ACETAMINOPHEN 325 MG PO TABS: 650.0000 mg | ORAL_TABLET | Freq: Four times a day (QID) | ORAL | Status: DC | PRN

## 2020-12-29 MED ORDER — HYDROXYZINE HCL 10 MG PO TABS
10.0000 mg | ORAL_TABLET | Freq: Three times a day (TID) | ORAL | Status: DC | PRN
  Filled 2020-12-29: qty 1

## 2020-12-29 MED ORDER — EPINEPHRINE 0.3 MG/0.3ML IJ SOAJ
0.3000 mg | INTRAMUSCULAR | Status: DC | PRN
  Filled 2020-12-29: qty 0.3

## 2020-12-29 MED ORDER — ALBUTEROL SULFATE (2.5 MG/3ML) 0.083% IN NEBU: 2.5000 mg | INHALATION_SOLUTION | Freq: Four times a day (QID) | RESPIRATORY_TRACT | Status: DC | PRN

## 2020-12-29 MED ORDER — SODIUM CHLORIDE 0.9 % IV SOLN: INTRAVENOUS | Status: DC

## 2020-12-29 MED ORDER — SODIUM CHLORIDE 0.9 % IV BOLUS (SEPSIS)
1000.0000 mL | Freq: Once | INTRAVENOUS | Status: AC
  Administered 2020-12-29: 1000 mL via INTRAVENOUS

## 2020-12-29 MED ORDER — SODIUM CHLORIDE 0.9 % IV SOLN
12.5000 mg | Freq: Four times a day (QID) | INTRAVENOUS | Status: AC | PRN
  Filled 2020-12-29: qty 0.5

## 2020-12-29 MED ORDER — SODIUM CHLORIDE 0.9 % IV SOLN
1.0000 g | INTRAVENOUS | Status: DC
  Administered 2020-12-30 – 2020-12-31 (×2): 1 g via INTRAVENOUS
  Filled 2020-12-29 (×2): qty 10
  Filled 2020-12-29: qty 1
  Filled 2020-12-29 (×2): qty 10

## 2020-12-29 MED ORDER — MOMETASONE FURO-FORMOTEROL FUM 200-5 MCG/ACT IN AERO
2.0000 | INHALATION_SPRAY | Freq: Two times a day (BID) | RESPIRATORY_TRACT | Status: DC
  Administered 2020-12-29 – 2021-01-01 (×6): 2 via RESPIRATORY_TRACT
  Filled 2020-12-29: qty 8.8

## 2020-12-29 MED ORDER — SODIUM CHLORIDE 0.9 % IV SOLN
2.0000 g | Freq: Once | INTRAVENOUS | Status: AC
  Administered 2020-12-29: 2 g via INTRAVENOUS
  Filled 2020-12-29: qty 20

## 2020-12-29 MED ORDER — DULOXETINE HCL 60 MG PO CPEP
60.0000 mg | ORAL_CAPSULE | Freq: Every day | ORAL | Status: DC
  Administered 2020-12-30 – 2020-12-31 (×2): 60 mg via ORAL
  Filled 2020-12-29 (×3): qty 1

## 2020-12-29 MED ORDER — METOCLOPRAMIDE HCL 10 MG PO TABS
5.0000 mg | ORAL_TABLET | Freq: Three times a day (TID) | ORAL | Status: DC
  Administered 2020-12-30 – 2021-01-01 (×7): 5 mg via ORAL
  Filled 2020-12-29 (×7): qty 1

## 2020-12-29 MED ORDER — AMPHETAMINE-DEXTROAMPHETAMINE 5 MG PO TABS
20.0000 mg | ORAL_TABLET | Freq: Two times a day (BID) | ORAL | Status: DC
  Administered 2020-12-30: 20 mg via ORAL
  Filled 2020-12-29 (×3): qty 4

## 2020-12-29 MED ORDER — MORPHINE SULFATE (PF) 2 MG/ML IV SOLN
2.0000 mg | INTRAVENOUS | Status: DC | PRN
  Administered 2020-12-29 – 2020-12-30 (×3): 2 mg via INTRAVENOUS
  Filled 2020-12-29 (×3): qty 1

## 2020-12-29 MED ORDER — HYDROMORPHONE HCL 1 MG/ML IJ SOLN
1.0000 mg | Freq: Once | INTRAMUSCULAR | Status: AC
  Administered 2020-12-29: 1 mg via INTRAVENOUS
  Filled 2020-12-29: qty 1

## 2020-12-29 MED ORDER — HYDROCORTISONE NA SUCCINATE PF 250 MG IJ SOLR
200.0000 mg | Freq: Once | INTRAMUSCULAR | Status: AC
  Administered 2020-12-29: 200 mg via INTRAVENOUS
  Filled 2020-12-29 (×2): qty 200

## 2020-12-29 MED ORDER — FENTANYL CITRATE (PF) 100 MCG/2ML IJ SOLN
12.5000 ug | INTRAMUSCULAR | Status: DC | PRN
Start: 2020-12-29 — End: 2021-01-01
  Administered 2020-12-30: 12.5 ug via INTRAVENOUS
  Filled 2020-12-29: qty 2

## 2020-12-29 MED ORDER — SUVOREXANT 20 MG PO TABS: 20.0000 mg | ORAL_TABLET | Freq: Every evening | ORAL | Status: DC | PRN

## 2020-12-29 MED ORDER — DIPHENHYDRAMINE HCL 25 MG PO CAPS
50.0000 mg | ORAL_CAPSULE | Freq: Once | ORAL | Status: AC
  Administered 2020-12-29: 50 mg via ORAL
  Filled 2020-12-29: qty 2

## 2020-12-29 MED ORDER — VANCOMYCIN HCL IN DEXTROSE 1-5 GM/200ML-% IV SOLN
1000.0000 mg | Freq: Once | INTRAVENOUS | Status: AC
  Administered 2020-12-29: 1000 mg via INTRAVENOUS
  Filled 2020-12-29: qty 200

## 2020-12-29 MED ORDER — ALPRAZOLAM 0.5 MG PO TABS: 1.0000 mg | ORAL_TABLET | Freq: Four times a day (QID) | ORAL | Status: DC | PRN

## 2020-12-29 MED ORDER — SODIUM CHLORIDE 0.9 % IV BOLUS (SEPSIS)
500.0000 mL | Freq: Once | INTRAVENOUS | Status: AC
  Administered 2020-12-29: 500 mL via INTRAVENOUS

## 2020-12-29 MED ORDER — ONDANSETRON HCL 4 MG/2ML IJ SOLN
4.0000 mg | Freq: Once | INTRAMUSCULAR | Status: AC
  Administered 2020-12-29: 4 mg via INTRAVENOUS
  Filled 2020-12-29: qty 2

## 2020-12-29 MED ORDER — HYDROMORPHONE HCL 1 MG/ML IJ SOLN
0.5000 mg | Freq: Once | INTRAMUSCULAR | Status: AC
  Administered 2020-12-29: 0.5 mg via INTRAVENOUS
  Filled 2020-12-29: qty 1

## 2020-12-29 MED ORDER — ONDANSETRON HCL 4 MG/2ML IJ SOLN: 4.0000 mg | Freq: Four times a day (QID) | INTRAMUSCULAR | Status: DC | PRN

## 2020-12-29 MED ORDER — SODIUM CHLORIDE 0.9 % IV BOLUS (SEPSIS)
250.0000 mL | Freq: Once | INTRAVENOUS | Status: AC
  Administered 2020-12-29: 250 mL via INTRAVENOUS

## 2020-12-29 MED ORDER — ENOXAPARIN SODIUM 40 MG/0.4ML IJ SOSY
40.0000 mg | PREFILLED_SYRINGE | INTRAMUSCULAR | Status: DC
  Administered 2020-12-29 – 2020-12-31 (×3): 40 mg via SUBCUTANEOUS
  Filled 2020-12-29 (×3): qty 0.4

## 2020-12-29 MED ORDER — ALPRAZOLAM 0.5 MG PO TABS: 1.0000 mg | ORAL_TABLET | Freq: Four times a day (QID) | ORAL | Status: DC

## 2020-12-29 MED ORDER — SODIUM CHLORIDE 0.9 % IV SOLN
2.0000 g | Freq: Once | INTRAVENOUS | Status: DC
  Administered 2020-12-29: 2 g via INTRAVENOUS
  Filled 2020-12-29: qty 2

## 2020-12-29 MED ORDER — VANCOMYCIN HCL IN DEXTROSE 1-5 GM/200ML-% IV SOLN
1000.0000 mg | Freq: Once | INTRAVENOUS | Status: DC
  Filled 2020-12-29: qty 200

## 2020-12-29 MED ORDER — QUETIAPINE FUMARATE 300 MG PO TABS
800.0000 mg | ORAL_TABLET | Freq: Every day | ORAL | Status: DC
  Administered 2020-12-29 – 2020-12-31 (×3): 800 mg via ORAL
  Filled 2020-12-29 (×4): qty 1

## 2020-12-29 NOTE — ED Notes (Signed)
Left calf, ankle, and foot are erythematous and warm to touch. Moderate swelling in lower extremity. Left dorsalis pedis pulse 2+, area outlined with permanent marker.
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
## 2020-12-29 NOTE — ED Triage Notes (Addendum)
Pt via EMS from home. Pt got a tattoo on her L ankle 4 days ago on her front porch. Pt's tattoo has redness, swelling, and painful. Pt is A&O4 and NAD. Pt took Tylenol approx 3 hours.   EMS vitals: 88/45, 94% RA, 22 RR, 92 HR, 98.4

## 2020-12-29 NOTE — Progress Notes (Signed)
PHARMACY -  BRIEF ANTIBIOTIC NOTE   Pharmacy has received consult(s) for antibiotics for sepsis from an ED provider.  The patient's profile has been reviewed for ht/wt/allergies/indication/available labs.    One time order(s) placed for vancomycin and ceftriaxone  Further antibiotics/pharmacy consults should be ordered by admitting physician if indicated.                       Thank you, Mills Koller 12/29/2020  6:11 PM

## 2020-12-29 NOTE — Progress Notes (Signed)
Pharmacy Antibiotic Note  Jamie Schultz is a 60 y.o. female admitted on 12/29/2020 with cellulitis.  Pharmacy has been consulted for vancomycin and levofloxacin dosing.  WBC elevated at 21.5, LA 1.1, pt afebrile. Patient has documented penicillin allergy but has taken cephalosporins in the past without incident. Flouroquinolone consult changed to ceftriaxone per pharmacy.  Plan: Vancomycin 1000mg  IV bolus  Start vancomycin 1000mg  IV q24h (estAUC 559, goal AUC 400-550) Continue ceftriaxone 1g IV q24h Monitor clinical picture, renal function, vanc levels if needed pending LOT F/U C&S, abx de-escalation, LOT   Height: 5' (152.4 cm) Weight: 51.3 kg (113 lb) IBW/kg (Calculated) : 45.5  Temp (24hrs), Avg:98.1 F (36.7 C), Min:98.1 F (36.7 C), Max:98.1 F (36.7 C)  Recent Labs  Lab 12/29/20 1738  WBC 21.5*  CREATININE 0.62  LATICACIDVEN 1.1    Estimated Creatinine Clearance: 53 mL/min (by C-G formula based on SCr of 0.62 mg/dL).    Allergies  Allergen Reactions  . Bee Venom Anaphylaxis and Other (See Comments)    Bees/wasps/yellow jackets  . Keflex [Cephalexin] Anaphylaxis  . Penicillins Anaphylaxis and Other (See Comments)    Has patient had a PCN reaction causing immediate rash, facial/tongue/throat swelling, SOB or lightheadedness with hypotension: Yes Has patient had a PCN reaction causing severe rash involving mucus membranes or skin necrosis: No Has patient had a PCN reaction that required hospitalization Yes, in the hospital already Has patient had a PCN reaction occurring within the last 10 years: Yes If all of the above answers are "NO", then may proceed with Cephalosporin use.   . Sulfa Antibiotics Other (See Comments)    Renal failure  . Hydrocodone Rash and Other (See Comments)    "I couldn't get my breath"  . Ibuprofen Other (See Comments)    Vomiting blood  . Contrast Media [Iodinated Diagnostic Agents] Other (See Comments)    Patient states she had a  previous reaction to iodinated contrast media agents. Premedicate.  . Nsaids Itching  . Codeine Itching and Rash  . Cyclobenzaprine Other (See Comments)    Severe constipation  . Fentanyl Nausea And Vomiting and Other (See Comments)    Severe vomiting  . Methadone Other (See Comments)    Change in mental status  . Neurontin [Gabapentin] Hives  . Tape Itching, Rash and Other (See Comments)    Blisters skin, Please use "paper" tape  . Tegretol [Carbamazepine] Hives and Rash  . Toradol [Ketorolac Tromethamine] Hives and Rash  . Tramadol Hives and Rash    Antimicrobials this admission: Aztreonam 5/1 x1 Vanc 5/1> Ceftriaxone 5/1>  Microbiology results: 5/1 BCx in progress   Brendolyn Patty, PharmD Clinical Pharmacist  12/29/2020   9:53 PM

## 2020-12-29 NOTE — Progress Notes (Signed)
CODE SEPSIS - PHARMACY COMMUNICATION  **Broad Spectrum Antibiotics should be administered within 1 hour of Sepsis diagnosis**  Time Code Sepsis Called/Page Received: 1750  Antibiotics Ordered: 1749  Time of 1st antibiotic administration: 1807  Additional action taken by pharmacy: change aztreonam to ceftriaxone in PCN allergic pt d/t documented tolerability of cephalosporins in the past  If necessary, Name of Provider/Nurse Contacted: [place] ,PharmD Clinical Pharmacist  12/29/2020  6:05 PM
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
## 2020-12-29 NOTE — ED Notes (Signed)
Dr Jari Pigg messaged per pt request for additional dose of pain medicine.

## 2020-12-30 ENCOUNTER — Inpatient Hospital Stay: Payer: Medicaid Other

## 2020-12-30 DIAGNOSIS — J432 Centrilobular emphysema: Secondary | ICD-10-CM

## 2020-12-30 DIAGNOSIS — M25561 Pain in right knee: Secondary | ICD-10-CM

## 2020-12-30 DIAGNOSIS — F3341 Major depressive disorder, recurrent, in partial remission: Secondary | ICD-10-CM

## 2020-12-30 DIAGNOSIS — M4802 Spinal stenosis, cervical region: Secondary | ICD-10-CM

## 2020-12-30 DIAGNOSIS — K5903 Drug induced constipation: Secondary | ICD-10-CM

## 2020-12-30 DIAGNOSIS — L03119 Cellulitis of unspecified part of limb: Secondary | ICD-10-CM | POA: Diagnosis not present

## 2020-12-30 DIAGNOSIS — I700 Atherosclerosis of aorta: Secondary | ICD-10-CM

## 2020-12-30 DIAGNOSIS — M25512 Pain in left shoulder: Secondary | ICD-10-CM

## 2020-12-30 DIAGNOSIS — M25562 Pain in left knee: Secondary | ICD-10-CM

## 2020-12-30 DIAGNOSIS — G8929 Other chronic pain: Secondary | ICD-10-CM

## 2020-12-30 DIAGNOSIS — M5441 Lumbago with sciatica, right side: Secondary | ICD-10-CM

## 2020-12-30 DIAGNOSIS — E034 Atrophy of thyroid (acquired): Secondary | ICD-10-CM

## 2020-12-30 DIAGNOSIS — M5442 Lumbago with sciatica, left side: Secondary | ICD-10-CM

## 2020-12-30 DIAGNOSIS — T402X5A Adverse effect of other opioids, initial encounter: Secondary | ICD-10-CM

## 2020-12-30 LAB — BASIC METABOLIC PANEL
Anion gap: 7 (ref 5–15)
BUN: 11 mg/dL (ref 8–23)
CO2: 24 mmol/L (ref 22–32)
Calcium: 8.5 mg/dL — ABNORMAL LOW (ref 8.9–10.3)
Chloride: 105 mmol/L (ref 98–111)
Creatinine, Ser: 0.61 mg/dL (ref 0.44–1.00)
GFR, Estimated: >60 mL/min (ref >60)
Glucose, Bld: 151 mg/dL — ABNORMAL HIGH (ref 70–99)
Potassium: 4.4 mmol/L (ref 3.5–5.1)
Sodium: 136 mmol/L (ref 135–145)

## 2020-12-30 LAB — CBC
HCT: 34.3 % — ABNORMAL LOW (ref 36.0–46.0)
Hemoglobin: 11.5 g/dL — ABNORMAL LOW (ref 12.0–15.0)
MCH: 30.2 pg (ref 26.0–34.0)
MCHC: 33.5 g/dL (ref 30.0–36.0)
MCV: 90 fL (ref 80.0–100.0)
Platelets: 367 10*3/uL (ref 150–400)
RBC: 3.81 MIL/uL — ABNORMAL LOW (ref 3.87–5.11)
RDW: 15.3 % (ref 11.5–15.5)
WBC: 19.1 10*3/uL — ABNORMAL HIGH (ref 4.0–10.5)
nRBC: 0 % (ref 0.0–0.2)

## 2020-12-30 LAB — HIV ANTIBODY (ROUTINE TESTING W REFLEX): HIV Screen 4th Generation wRfx: NONREACTIVE

## 2020-12-30 LAB — PROCALCITONIN: Procalcitonin: 0.35 ng/mL

## 2020-12-30 LAB — SARS CORONAVIRUS 2 (TAT 6-24 HRS): SARS Coronavirus 2: NEGATIVE

## 2020-12-30 LAB — MRSA PCR SCREENING: MRSA by PCR: POSITIVE — AB

## 2020-12-30 LAB — CK: Total CK: 155 U/L (ref 38–234)

## 2020-12-30 MED ORDER — OXYCODONE HCL 5 MG PO TABS
10.0000 mg | ORAL_TABLET | Freq: Four times a day (QID) | ORAL | Status: DC | PRN
  Administered 2020-12-30 – 2020-12-31 (×3): 10 mg via ORAL
  Filled 2020-12-30 (×3): qty 2

## 2020-12-30 MED ORDER — VANCOMYCIN HCL IN DEXTROSE 1-5 GM/200ML-% IV SOLN
1000.0000 mg | INTRAVENOUS | Status: DC
  Administered 2020-12-30: 1000 mg via INTRAVENOUS
  Filled 2020-12-30 (×3): qty 200

## 2020-12-30 MED ORDER — MORPHINE SULFATE (PF) 4 MG/ML IV SOLN
4.0000 mg | INTRAVENOUS | Status: DC | PRN
  Administered 2020-12-30 – 2020-12-31 (×4): 4 mg via INTRAVENOUS
  Filled 2020-12-30 (×4): qty 1

## 2020-12-30 MED ORDER — CHLORHEXIDINE GLUCONATE CLOTH 2 % EX PADS
6.0000 | MEDICATED_PAD | Freq: Every day | CUTANEOUS | Status: DC
  Administered 2021-01-01: 6 via TOPICAL

## 2020-12-30 MED ORDER — MUPIROCIN 2 % EX OINT
1.0000 "application " | TOPICAL_OINTMENT | Freq: Two times a day (BID) | CUTANEOUS | Status: DC
  Administered 2020-12-30 – 2021-01-01 (×4): 1 via NASAL
  Filled 2020-12-30: qty 22

## 2020-12-30 MED ORDER — IOHEXOL 300 MG/ML  SOLN
75.0000 mL | Freq: Once | INTRAMUSCULAR | Status: AC | PRN
  Administered 2020-12-30: 75 mL via INTRAVENOUS

## 2020-12-30 MED ORDER — MELATONIN 5 MG PO TABS
2.5000 mg | ORAL_TABLET | Freq: Every day | ORAL | Status: DC
  Administered 2020-12-30 – 2020-12-31 (×2): 2.5 mg via ORAL
  Filled 2020-12-30 (×2): qty 1

## 2020-12-30 NOTE — Progress Notes (Signed)
Pt found in room ambulating to bathroom without assistance. Bed alarm on and actively alarming at time found. Yellow socks provided to pt. Pt provided with RN assistance to bathroom 30 minutes prior to being found.  Pt educated about calling for assistance before getting out of bed. Pt demonstrated success utilizing call bell once back in bed. No fall or injury occurred.

## 2020-12-30 NOTE — Progress Notes (Addendum)
PROGRESS NOTE  Jamie Schultz  PNT:[id number] DOB: 1960/11/16 DOA: 12/29/2020 PCP: Simona Huh, NP   Brief Narrative: Jamie Schultz is a 60 y.o. female with a history of COPD, HLD, depression, chronic pain syndrome who presented to the ED 5/1 with pain, redness, and swelling in the left lower leg that worsened over the week after getting a tattoo on her porch 4/25. She had evidence of cellulitis in exam with CT imaging demonstrating soft tissue infection, possible myositis without osteomyelitis or abscess. WBC 21.5k. Broad spectrum antibiotics were started for cellulitis and the patient admitted.  Assessment & Plan: Principal Problem:   Cellulitis and abscess of foot Active Problems:   Major depressive disorder, recurrent episode, 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HCC)   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HCC)   Hypothyroidism   Therapeutic opioid induced constipation   Cervical stenosis of spinal canal   S/P shoulder replacement   Chronic pain syndrome   Chronic low back pain (Tertiary Area of Pain) (B (L>R)   Chronic left shoulder pain (Fourth Area of Pain)   Bilateral chronic knee pain (B (L>R)   Aortic atherosclerosis (HCC)   S/P total knee arthroplasty  Cellulitis and myositis of left lower leg: Not extending past demarcations. No abscess, osteo.  - Continue vancomycin and ceftriaxone due to severity. 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 - Trend WBC and exam  - Given mechanism (tattoo) and severity at presentation, waiting to confirm blood cultures are negative x48 hours is prudent to minimize risk of under-treated bacteremia. - Unfortunately had to get IV steroids for contrast administration which is expected to impair host response. - Augment pain control, increase morphine dosing. Will transition to po pain medications over next 24 hours.  COPD: No exacerbation - No changes  Chronic pain: PDMP reviewed, getting oxycodone 10mg  IR up to 4 times per day last  prescribed 4/22. Will reorder this.  Depression, anxiety, ADHD, insomnia:  - Continue medications as ordered. Belsomnra is unavailable, will substitute melatonin.  Neuropathy:  - Continue home lyrica.   Polypharmacy: Agree that a more parsimonious medication list would help reduce risks to the patient. Defer to PCP for modifications to chronic therapies.  DVT prophylaxis: Lovenox Code Status: Full Family Communication: Husband at bedside Disposition Plan:  Status is: Inpatient  Remains inpatient appropriate because:Ongoing active pain requiring inpatient pain management and Inpatient level of care appropriate due to severity of illness. Cellulitis with myositis is persistent with significant leukocytosis. Will need negative blood cultures x48 hours prior to discharge.  Dispo: The patient is from: Home              Anticipated d/c is to: Home              Patient currently is not medically stable to d/c.   Difficult to place patient No  Consultants:   None  Procedures:   None  Antimicrobials:  Vancomycin, ceftriaxone   Subjective: Pain is severe, constant, waxing, waning, throbbing in the left lower leg improved incompletely and only for a short time with morphine, though this helped more than other options tried thus far.   Objective: Vitals:   12/29/20 2221 12/30/20 0356 12/30/20 0741 12/30/20 1054  BP: 120/74 (!) 97/58 92/66 106/68  Pulse: 95 92 78   Resp: 18 20 18    Temp: 99 F (37.2 C) 98.6 F (37 C) 97.9 F (36.6 C)   TempSrc: Oral Oral Oral   SpO2: 97% 99% 96% 99%  Weight:      Height:        Intake/Output Summary (Last 24 hours) at 12/30/2020 1449 Last data filed at 12/30/2020 1413 Gross per 24 hour  Intake 982.67 ml  Output --  Net 982.67 ml   Filed Weights   12/29/20 1733  Weight: 51.3 kg   Gen: Chronically ill-appearing female in no distress Pulm: Non-labored breathing room air. Clear to auscultation bilaterally.  CV: Regular rate and rhythm.  No murmur, rub, or gallop. NO JVD GI: Abdomen soft, non-tender, non-distended, with normoactive bowel sounds. No organomegaly or masses felt. Ext: Warm, no deformities Skin: LLE with lower leg erythema without distinct borders, though regressed from demarcations. Exquisitely tender without palpable fluctuance. Central tattoo site appears to have purulent weeping wound. Leg is diffusely moderately swollen. Neuro: Alert and oriented. No focal neurological deficits. Psych: Judgement and insight appear normal. Mood & affect appropriate.   Data Reviewed: I have personally reviewed following labs and imaging studies  CBC: Recent Labs  Lab 12/29/20 1738 12/30/20 0518  WBC 21.5* 19.1*  NEUTROABS 17.6*  --   HGB 11.1* 11.5*  HCT 33.3* 34.3*  MCV 90.0 90.0  PLT 363 144   Basic Metabolic Panel: Recent Labs  Lab 12/29/20 1738 12/30/20 0518  NA 135 136  K 4.2 4.4  CL 97* 105  CO2 30 24  GLUCOSE 121* 151*  BUN 15 11  CREATININE 0.62 0.61  CALCIUM 8.6* 8.5*   GFR: Estimated Creatinine Clearance: 53 mL/min (by C-G formula based on SCr of 0.61 mg/dL). Liver Function Tests: Recent Labs  Lab 12/29/20 1738  AST 30  ALT 42  ALKPHOS 167*  BILITOT 0.5  PROT 5.9*  ALBUMIN 2.4*   No results for input(s): LIPASE, AMYLASE in the last 168 hours. No results for input(s): AMMONIA in the last 168 hours. Coagulation Profile: Recent Labs  Lab 12/29/20 1738  INR 1.0   Cardiac Enzymes: Recent Labs  Lab 12/30/20 0518  CKTOTAL 155   BNP (last 3 results) No results for input(s): PROBNP in the last 8760 hours. HbA1C: No results for input(s): HGBA1C in the last 72 hours. CBG: No results for input(s): GLUCAP in the last 168 hours. Lipid Profile: No results for input(s): CHOL, HDL, LDLCALC, TRIG, CHOLHDL, LDLDIRECT in the last 72 hours. Thyroid Function Tests: No results for input(s): TSH, T4TOTAL, FREET4, T3FREE, THYROIDAB in the last 72 hours. Anemia Panel: No results for input(s):  VITAMINB12, FOLATE, FERRITIN, TIBC, IRON, RETICCTPCT in the last 72 hours. Urine analysis:    Component Value Date/Time   COLORURINE YELLOW 10/27/2019 [place] 10/27/2019 1134   APPEARANCEUR Clear 02/14/2014 2007   LABSPEC 1.011 10/27/2019 1134   LABSPEC 1.021 02/14/2014 2007   PHURINE 7.0 10/27/2019 1134   GLUCOSEU NEGATIVE 10/27/2019 1134   GLUCOSEU Negative 02/14/2014 2007   HGBUR NEGATIVE 10/27/2019 1134   BILIRUBINUR NEGATIVE 10/27/2019 1134   BILIRUBINUR NEGATIVE 06/14/2015 1419   BILIRUBINUR Negative 02/14/2014 2007   KETONESUR NEGATIVE 10/27/2019 1134   PROTEINUR NEGATIVE 10/27/2019 1134   UROBILINOGEN 0.2 06/14/2015 1419   UROBILINOGEN 0.2 03/10/2012 1900   NITRITE NEGATIVE 10/27/2019 [place] 10/27/2019 1134   LEUKOCYTESUR Negative 02/14/2014 2007   Recent Results (from the past 240 hour(s))  Culture, blood (Routine x 2)     Status: None (Preliminary result)   Collection Time: 12/29/20  5:38 PM   Specimen: BLOOD  Result Value Ref Range Status   Specimen Description BLOOD BLOOD LEFT  FOREARM  Final   Special Requests   Final    BOTTLES DRAWN AEROBIC AND ANAEROBIC Blood Culture results may not be optimal due to an inadequate volume of blood received in culture bottles   Culture   Final    NO GROWTH < 12 HOURS Performed at [facility], [address], [place], [place] [address]    Report Status PENDING  Incomplete  Culture, blood (Routine x 2)     Status: None (Preliminary result)   Collection Time: 12/29/20  5:42 PM   Specimen: BLOOD  Result Value Ref Range Status   Specimen Description BLOOD BLOOD RIGHT FOREARM  Final   Special Requests   Final    BOTTLES DRAWN AEROBIC AND ANAEROBIC Blood Culture results may not be optimal due to an inadequate volume of blood received in culture bottles   Culture   Final    NO GROWTH < 12 HOURS Performed at [facility], [address] [place], [place] [address]     Report Status PENDING  Incomplete  SARS CORONAVIRUS 2 (TAT 6-24 HRS) Nasopharyngeal Nasopharyngeal Swab     Status: None   Collection Time: 12/29/20  8:46 PM   Specimen: Nasopharyngeal Swab  Result Value Ref Range Status   SARS Coronavirus 2 NEGATIVE NEGATIVE Final    Comment: (NOTE) SARS-CoV-2 target nucleic acids are NOT DETECTED.  The SARS-CoV-2 RNA is generally detectable in upper and lower respiratory specimens during the acute phase of infection. Negative results do not preclude SARS-CoV-2 infection, do not rule out co-infections with other pathogens, and should not be used as the sole basis for treatment or other patient management decisions. Negative results must be combined with clinical observations, patient history, and epidemiological information. The expected result is Negative.  Fact Sheet for Patients: SugarRoll.be  Fact Sheet for Healthcare Providers: https://www.woods-mathews.com/  This test is not yet approved or cleared by the [place] FDA and  has been authorized for detection and/or diagnosis of SARS-CoV-2 by FDA under an Emergency Use Authorization (EUA). This EUA will remain  in effect (meaning this test can be used) for the duration of the COVID-19 declaration under Se ction 564(b)(1) of the Act, 21 U.S.C. section 360bbb-3(b)(1), unless the authorization is terminated or revoked sooner.  Performed at [facility], [place] [address], [place], [place] [address]   MRSA PCR Screening     Status: Abnormal   Collection Time: 12/29/20 11:45 PM   Specimen: Nasopharyngeal  Result Value Ref Range Status   MRSA by PCR POSITIVE (A) NEGATIVE Final    Comment:        The GeneXpert MRSA Assay (FDA approved for NASAL specimens only), is one component of a comprehensive MRSA colonization surveillance program. It is not intended to diagnose MRSA infection nor to guide or monitor treatment for MRSA  infections. RESULT CALLED TO, READ BACK BY AND VERIFIED WITH: [facility] PATRICK AT 0148 12/30/20 MF Performed at [facility], [address], [place], [place] [address]       Radiology Studies: DG Tibia/Fibula Left  Result Date: 12/29/2020 CLINICAL DATA:  Cellulitis, infection from tattoo. EXAM: LEFT TIBIA AND FIBULA - 2 VIEW COMPARISON:  Plain film the LEFT knee dated 04/18/2018. FINDINGS: Osseous alignment is normal. Bone mineralization is normal. No fracture line or displaced fracture fragment. No acute-appearing cortical irregularity or osseous lesion. No evidence of osteomyelitis. Apparent soft tissue swelling/edema. No soft tissue gas or foreign body is seen. No appreciable joint effusion at the  LEFT knee. LEFT knee arthroplasty hardware appears intact and appropriately positioned. IMPRESSION: 1. Apparent soft tissue swelling/edema. No soft tissue gas or foreign body. 2. No osseous abnormality. 3. LEFT knee arthroplasty hardware appears intact and appropriately positioned. Electronically Signed   By: Franki Cabot M.D.   On: 12/29/2020 18:48   CT TIBIA FIBULA LEFT W CONTRAST  Result Date: 12/30/2020 CLINICAL DATA:  60 year old female with soft tissue infection. EXAM: CT OF THE LOWER LEFT EXTREMITY WITH CONTRAST TECHNIQUE: Multidetector CT imaging of the lower right extremity was performed according to the standard protocol following intravenous contrast administration. CONTRAST:  63mL OMNIPAQUE IOHEXOL 300 MG/ML  SOLN COMPARISON:  Left tib fib series 12/29/2020. FINDINGS: Imaging from the distal femur metadiaphysis through most of the left foot. Left total knee arthroplasty redemonstrated. No adverse hardware features. No knee joint effusion is evident. No acute osseous abnormality identified. There is a small chronic appearing calcific fragment just proximal to the medial navicular. Preserved alignment at the left ankle. No ankle joint effusion. Subcutaneous soft tissues appear normal above  the knee, but in the left tib fib and especially along the medial ankle and dorsal foot there is moderate to severe generalized subcutaneous edema and stranding. No soft tissue gas. No discrete or drainable fluid collection. Mild muscle involvement in the calf is suspected. The visible major vascular structures including the popliteal artery, runoff vessels, and veins are enhancing and appear to be patent. IMPRESSION: 1. Widespread soft tissue edema compatible with cellulitis. Possible associated myositis. 2. But no acute osseous abnormality. No associated abscess or other complicating features. 3. Total knee arthroplasty. Electronically Signed   By: Genevie Ann M.D.   On: 12/30/2020 04:11   CT ANKLE LEFT W CONTRAST  Result Date: 12/30/2020 CLINICAL DATA:  60 year old female with soft tissue infection. EXAM: CT OF THE LOWER LEFT EXTREMITY WITH CONTRAST TECHNIQUE: Multidetector CT imaging of the lower right extremity was performed according to the standard protocol following intravenous contrast administration. CONTRAST:  29mL OMNIPAQUE IOHEXOL 300 MG/ML  SOLN COMPARISON:  Left tib fib series 12/29/2020. FINDINGS: Imaging from the distal femur metadiaphysis through most of the left foot. Left total knee arthroplasty redemonstrated. No adverse hardware features. No knee joint effusion is evident. No acute osseous abnormality identified. There is a small chronic appearing calcific fragment just proximal to the medial navicular. Preserved alignment at the left ankle. No ankle joint effusion. Subcutaneous soft tissues appear normal above the knee, but in the left tib fib and especially along the medial ankle and dorsal foot there is moderate to severe generalized subcutaneous edema and stranding. No soft tissue gas. No discrete or drainable fluid collection. Mild muscle involvement in the calf is suspected. The visible major vascular structures including the popliteal artery, runoff vessels, and veins are enhancing and  appear to be patent. IMPRESSION: 1. Widespread soft tissue edema compatible with cellulitis. Possible associated myositis. 2. But no acute osseous abnormality. No associated abscess or other complicating features. 3. Total knee arthroplasty. Electronically Signed   By: Genevie Ann M.D.   On: 12/30/2020 04:11    Scheduled Meds: . amphetamine-dextroamphetamine  20 mg Oral BID  . DULoxetine  60 mg Oral QPC lunch  . enoxaparin (LOVENOX) injection  40 mg Subcutaneous Q24H  . metoCLOPramide  5 mg Oral TID AC  . mometasone-formoterol  2 puff Inhalation BID  . pravastatin  20 mg Oral Daily  . pregabalin  100 mg Oral TID  . QUEtiapine  800 mg Oral QHS  Continuous Infusions: . sodium chloride 125 mL/hr at 12/30/20 0523  . cefTRIAXone (ROCEPHIN)  IV    . promethazine (PHENERGAN) injection (IM or IVPB)    . vancomycin       LOS: 1 day   Time spent: 25 minutes.  Patrecia Pour, MD [facility] [email or web address] 12/30/2020, 2:49 PM

## 2020-12-31 DIAGNOSIS — I700 Atherosclerosis of aorta: Secondary | ICD-10-CM | POA: Diagnosis not present

## 2020-12-31 DIAGNOSIS — M4802 Spinal stenosis, cervical region: Secondary | ICD-10-CM | POA: Diagnosis not present

## 2020-12-31 DIAGNOSIS — M25561 Pain in right knee: Secondary | ICD-10-CM | POA: Diagnosis not present

## 2020-12-31 DIAGNOSIS — L03119 Cellulitis of unspecified part of limb: Secondary | ICD-10-CM | POA: Diagnosis not present

## 2020-12-31 LAB — CBC
HCT: 32.5 % — ABNORMAL LOW (ref 36.0–46.0)
Hemoglobin: 10.7 g/dL — ABNORMAL LOW (ref 12.0–15.0)
MCH: 30.2 pg (ref 26.0–34.0)
MCHC: 32.9 g/dL (ref 30.0–36.0)
MCV: 91.8 fL (ref 80.0–100.0)
Platelets: 397 10*3/uL (ref 150–400)
RBC: 3.54 MIL/uL — ABNORMAL LOW (ref 3.87–5.11)
RDW: 15.3 % (ref 11.5–15.5)
WBC: 15.6 10*3/uL — ABNORMAL HIGH (ref 4.0–10.5)
nRBC: 0 % (ref 0.0–0.2)

## 2020-12-31 LAB — CREATININE, SERUM
Creatinine, Ser: 0.64 mg/dL (ref 0.44–1.00)
GFR, Estimated: >60 mL/min (ref >60)

## 2020-12-31 MED ORDER — AMPHETAMINE-DEXTROAMPHETAMINE 10 MG PO TABS
20.0000 mg | ORAL_TABLET | Freq: Every day | ORAL | Status: DC
  Administered 2020-12-31 – 2021-01-01 (×2): 20 mg via ORAL
  Filled 2020-12-31: qty 1
  Filled 2020-12-31 (×2): qty 2

## 2020-12-31 MED ORDER — BENZONATATE 100 MG PO CAPS
100.0000 mg | ORAL_CAPSULE | Freq: Three times a day (TID) | ORAL | Status: DC | PRN
  Administered 2021-01-01: 100 mg via ORAL
  Filled 2020-12-31: qty 1

## 2020-12-31 MED ORDER — OXYCODONE HCL 5 MG PO TABS
15.0000 mg | ORAL_TABLET | Freq: Four times a day (QID) | ORAL | Status: DC | PRN
  Administered 2020-12-31 – 2021-01-01 (×4): 15 mg via ORAL
  Filled 2020-12-31 (×4): qty 3

## 2020-12-31 MED ORDER — VANCOMYCIN HCL 1000 MG/200ML IV SOLN
1000.0000 mg | INTRAVENOUS | Status: DC
  Administered 2020-12-31: 1000 mg via INTRAVENOUS
  Filled 2020-12-31 (×2): qty 200

## 2020-12-31 MED ORDER — AMPHETAMINE-DEXTROAMPHETAMINE 10 MG PO TABS: 20.0000 mg | ORAL_TABLET | Freq: Every day | ORAL | Status: DC

## 2020-12-31 NOTE — Progress Notes (Signed)
PROGRESS NOTE  Jamie Schultz  [id number] DOB: October 25, 1960 DOA: 12/29/2020 PCP: Simona Huh, NP   Brief Narrative: Jamie Schultz is a 60 y.o. female with a history of COPD, HLD, depression, chronic pain syndrome who presented to the ED 5/1 with pain, redness, and swelling in the left lower leg that worsened over the week after getting a tattoo on her porch 4/25. She had evidence of cellulitis in exam with CT imaging demonstrating soft tissue infection, possible myositis without osteomyelitis or abscess. WBC 21.5k. Broad spectrum antibiotics were started for cellulitis and the patient admitted.  Assessment & Plan: Principal Problem:   Cellulitis and abscess of foot Active Problems:   Major depressive disorder, recurrent episode, 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HCC)   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HCC)   Hypothyroidism   Therapeutic opioid induced constipation   Cervical stenosis of spinal canal   S/P shoulder replacement   Chronic pain syndrome   Chronic low back pain (Tertiary Area of Pain) (B (L>R)   Chronic left shoulder pain (Fourth Area of Pain)   Bilateral chronic knee pain (B (L>R)   Aortic atherosclerosis (HCC)   S/P total knee arthroplasty  Cellulitis and myositis of left lower leg: Not extending past demarcations. No abscess, osteo.  - Continue vancomycin and ceftriaxone due to severity. 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 - Trend WBC and exam, responding to broad IV abx. If fails to improve would cover pseudomonas. - Given mechanism (tattoo) and severity at presentation, waiting to confirm blood cultures are negative x48 hours is prudent to minimize risk of under-treated bacteremia. - Unfortunately had to get IV steroids for contrast administration which is expected to impair host response. - Augment pain control as below, will increase home oxycodone 10mg  dosing to 15mg  to help with acute pain.  COPD: No exacerbation - No changes  Chronic  pain: PDMP reviewed, getting oxycodone 10mg  IR up to 4 times per day last prescribed 4/22.  - Continue this regimen with additional 5mg  for acute component.  Depression, anxiety, ADHD, insomnia:  - Continue medications as ordered. Belsomnra is unavailable, substituted melatonin.  Neuropathy:  - Continue home lyrica.   Polypharmacy: Agree that a more parsimonious medication list would help reduce risks to the patient. Defer to PCP for modifications to chronic therapies.  DVT prophylaxis: Lovenox Code Status: Full Family Communication: None at bedside Disposition Plan:  Status is: Inpatient  Remains inpatient appropriate because:Ongoing active pain requiring inpatient pain management and Inpatient level of care appropriate due to severity of illness. Can DC 5/4 with blood cultures remain negative and clinically responding.  Dispo: The patient is from: Home              Anticipated d/c is to: Home              Patient currently is not medically stable to d/c.   Difficult to place patient No  Consultants:   None  Procedures:   None  Antimicrobials:  Vancomycin, ceftriaxone   Subjective: Pain in the left lower leg is severe, constant, worse with throbbing when not elevated. Starting to express more discharge.   Objective: Vitals:   12/30/20 1926 12/31/20 0009 12/31/20 0506 12/31/20 0807  BP: 138/84 (!) 90/54 110/74 106/84  Pulse: 88 87 75 80  Resp: 18 17 18 16   Temp: 98.8 F (37.1 C) 98.3 F (36.8 C) 97.7 F (36.5 C) 98.4 F (36.9 C)  TempSrc:      SpO2:  95% 91% 100%   Weight:      Height:        Intake/Output Summary (Last 24 hours) at 12/31/2020 1214 Last data filed at 12/31/2020 0600 Gross per 24 hour  Intake 420.06 ml  Output --  Net 420.06 ml   Filed Weights   12/29/20 1733  Weight: 51.3 kg   Gen: 60 y.o. female in no distress Pulm: Nonlabored breathing room air. Clear. CV: Regular rate and rhythm. No murmur, rub, or gallop. No JVD, no dependent  edema. GI: Abdomen soft, non-tender, non-distended, with normoactive bowel sounds.  Ext: Warm, no deformities Skin: Left lower leg with decreased intensity of erythema, ill-defined surrounding irregular tattoo site which is now eschar with yellow/green slough. Tender with dependent induration posteriorly, no fluctuance. No joint swelling. Otherwise no new rashes, lesions or ulcers on visualized skin. Neuro: Alert and oriented. No focal neurological deficits. Psych: Judgement and insight appear fair. Mood euthymic & affect congruent. Behavior is appropriate.    Data Reviewed: I have personally reviewed following labs and imaging studies  CBC: Recent Labs  Lab 12/29/20 1738 12/30/20 0518 12/31/20 0544  WBC 21.5* 19.1* 15.6*  NEUTROABS 17.6*  --   --   HGB 11.1* 11.5* 10.7*  HCT 33.3* 34.3* 32.5*  MCV 90.0 90.0 91.8  PLT 363 367 831   Basic Metabolic Panel: Recent Labs  Lab 12/29/20 1738 12/30/20 0518 12/31/20 0544  NA 135 136  --   K 4.2 4.4  --   CL 97* 105  --   CO2 30 24  --   GLUCOSE 121* 151*  --   BUN 15 11  --   CREATININE 0.62 0.61 0.64  CALCIUM 8.6* 8.5*  --    GFR: Estimated Creatinine Clearance: 53 mL/min (by C-G formula based on SCr of 0.64 mg/dL). Liver Function Tests: Recent Labs  Lab 12/29/20 1738  AST 30  ALT 42  ALKPHOS 167*  BILITOT 0.5  PROT 5.9*  ALBUMIN 2.4*   No results for input(s): LIPASE, AMYLASE in the last 168 hours. No results for input(s): AMMONIA in the last 168 hours. Coagulation Profile: Recent Labs  Lab 12/29/20 1738  INR 1.0   Cardiac Enzymes: Recent Labs  Lab 12/30/20 0518  CKTOTAL 155   BNP (last 3 results) No results for input(s): PROBNP in the last 8760 hours. HbA1C: No results for input(s): HGBA1C in the last 72 hours. CBG: No results for input(s): GLUCAP in the last 168 hours. Lipid Profile: No results for input(s): CHOL, HDL, LDLCALC, TRIG, CHOLHDL, LDLDIRECT in the last 72 hours. Thyroid Function  Tests: No results for input(s): TSH, T4TOTAL, FREET4, T3FREE, THYROIDAB in the last 72 hours. Anemia Panel: No results for input(s): VITAMINB12, FOLATE, FERRITIN, TIBC, IRON, RETICCTPCT in the last 72 hours. Urine analysis:    Component Value Date/Time   COLORURINE YELLOW 10/27/2019 [place] 10/27/2019 1134   APPEARANCEUR Clear 02/14/2014 2007   LABSPEC 1.011 10/27/2019 1134   LABSPEC 1.021 02/14/2014 2007   PHURINE 7.0 10/27/2019 [place] 10/27/2019 1134   GLUCOSEU Negative 02/14/2014 2007   HGBUR NEGATIVE 10/27/2019 [place] 10/27/2019 1134   BILIRUBINUR NEGATIVE 06/14/2015 1419   BILIRUBINUR Negative 02/14/2014 2007   KETONESUR NEGATIVE 10/27/2019 1134   PROTEINUR NEGATIVE 10/27/2019 1134   UROBILINOGEN 0.2 06/14/2015 1419   UROBILINOGEN 0.2 03/10/2012 1900   NITRITE NEGATIVE 10/27/2019 [place] 10/27/2019 1134   LEUKOCYTESUR Negative 02/14/2014 2007  Recent Results (from the past 240 hour(s))  Culture, blood (Routine x 2)     Status: None (Preliminary result)   Collection Time: 12/29/20  5:38 PM   Specimen: BLOOD  Result Value Ref Range Status   Specimen Description BLOOD BLOOD LEFT FOREARM  Final   Special Requests   Final    BOTTLES DRAWN AEROBIC AND ANAEROBIC Blood Culture results may not be optimal due to an inadequate volume of blood received in culture bottles   Culture   Final    NO GROWTH 2 DAYS Performed at [facility], [address] [place], [place] [address]    Report Status PENDING  Incomplete  Culture, blood (Routine x 2)     Status: None (Preliminary result)   Collection Time: 12/29/20  5:42 PM   Specimen: BLOOD  Result Value Ref Range Status   Specimen Description BLOOD BLOOD RIGHT FOREARM  Final   Special Requests   Final    BOTTLES DRAWN AEROBIC AND ANAEROBIC Blood Culture results may not be optimal due to an inadequate volume of blood received in culture bottles    Culture   Final    NO GROWTH 2 DAYS Performed at [facility], [address], [place], [place] [address]    Report Status PENDING  Incomplete  SARS CORONAVIRUS 2 (TAT 6-24 HRS) Nasopharyngeal Nasopharyngeal Swab     Status: None   Collection Time: 12/29/20  8:46 PM   Specimen: Nasopharyngeal Swab  Result Value Ref Range Status   SARS Coronavirus 2 NEGATIVE NEGATIVE Final    Comment: (NOTE) SARS-CoV-2 target nucleic acids are NOT DETECTED.  The SARS-CoV-2 RNA is generally detectable in upper and lower respiratory specimens during the acute phase of infection. Negative results do not preclude SARS-CoV-2 infection, do not rule out co-infections with other pathogens, and should not be used as the sole basis for treatment or other patient management decisions. Negative results must be combined with clinical observations, patient history, and epidemiological information. The expected result is Negative.  Fact Sheet for Patients: SugarRoll.be  Fact Sheet for Healthcare Providers: https://www.woods-mathews.com/  This test is not yet approved or cleared by the [place] FDA and  has been authorized for detection and/or diagnosis of SARS-CoV-2 by FDA under an Emergency Use Authorization (EUA). This EUA will remain  in effect (meaning this test can be used) for the duration of the COVID-19 declaration under Se ction 564(b)(1) of the Act, 21 U.S.C. section 360bbb-3(b)(1), unless the authorization is terminated or revoked sooner.  Performed at [facility], [place] [address], [place], [place] [address]   MRSA PCR Screening     Status: Abnormal   Collection Time: 12/29/20 11:45 PM   Specimen: Nasopharyngeal  Result Value Ref Range Status   MRSA by PCR POSITIVE (A) NEGATIVE Final    Comment:        The GeneXpert MRSA Assay (FDA approved for NASAL specimens only), is one component of a comprehensive MRSA  colonization surveillance program. It is not intended to diagnose MRSA infection nor to guide or monitor treatment for MRSA infections. RESULT CALLED TO, READ BACK BY AND VERIFIED WITH: [facility] PATRICK AT 0148 12/30/20 MF Performed at [facility], [address] [place], [place] [address]       Radiology Studies: DG Tibia/Fibula Left  Result Date: 12/29/2020 CLINICAL DATA:  Cellulitis, infection from tattoo. EXAM: LEFT TIBIA AND FIBULA - 2 VIEW COMPARISON:  Plain film the LEFT knee dated 04/18/2018. FINDINGS: Osseous  alignment is normal. Bone mineralization is normal. No fracture line or displaced fracture fragment. No acute-appearing cortical irregularity or osseous lesion. No evidence of osteomyelitis. Apparent soft tissue swelling/edema. No soft tissue gas or foreign body is seen. No appreciable joint effusion at the LEFT knee. LEFT knee arthroplasty hardware appears intact and appropriately positioned. IMPRESSION: 1. Apparent soft tissue swelling/edema. No soft tissue gas or foreign body. 2. No osseous abnormality. 3. LEFT knee arthroplasty hardware appears intact and appropriately positioned. Electronically Signed   By: Franki Cabot M.D.   On: 12/29/2020 18:48   CT TIBIA FIBULA LEFT W CONTRAST  Result Date: 12/30/2020 CLINICAL DATA:  60 year old female with soft tissue infection. EXAM: CT OF THE LOWER LEFT EXTREMITY WITH CONTRAST TECHNIQUE: Multidetector CT imaging of the lower right extremity was performed according to the standard protocol following intravenous contrast administration. CONTRAST:  34mL OMNIPAQUE IOHEXOL 300 MG/ML  SOLN COMPARISON:  Left tib fib series 12/29/2020. FINDINGS: Imaging from the distal femur metadiaphysis through most of the left foot. Left total knee arthroplasty redemonstrated. No adverse hardware features. No knee joint effusion is evident. No acute osseous abnormality identified. There is a small chronic appearing calcific fragment just proximal to the  medial navicular. Preserved alignment at the left ankle. No ankle joint effusion. Subcutaneous soft tissues appear normal above the knee, but in the left tib fib and especially along the medial ankle and dorsal foot there is moderate to severe generalized subcutaneous edema and stranding. No soft tissue gas. No discrete or drainable fluid collection. Mild muscle involvement in the calf is suspected. The visible major vascular structures including the popliteal artery, runoff vessels, and veins are enhancing and appear to be patent. IMPRESSION: 1. Widespread soft tissue edema compatible with cellulitis. Possible associated myositis. 2. But no acute osseous abnormality. No associated abscess or other complicating features. 3. Total knee arthroplasty. Electronically Signed   By: Genevie Ann M.D.   On: 12/30/2020 04:11   CT ANKLE LEFT W CONTRAST  Result Date: 12/30/2020 CLINICAL DATA:  60 year old female with soft tissue infection. EXAM: CT OF THE LOWER LEFT EXTREMITY WITH CONTRAST TECHNIQUE: Multidetector CT imaging of the lower right extremity was performed according to the standard protocol following intravenous contrast administration. CONTRAST:  28mL OMNIPAQUE IOHEXOL 300 MG/ML  SOLN COMPARISON:  Left tib fib series 12/29/2020. FINDINGS: Imaging from the distal femur metadiaphysis through most of the left foot. Left total knee arthroplasty redemonstrated. No adverse hardware features. No knee joint effusion is evident. No acute osseous abnormality identified. There is a small chronic appearing calcific fragment just proximal to the medial navicular. Preserved alignment at the left ankle. No ankle joint effusion. Subcutaneous soft tissues appear normal above the knee, but in the left tib fib and especially along the medial ankle and dorsal foot there is moderate to severe generalized subcutaneous edema and stranding. No soft tissue gas. No discrete or drainable fluid collection. Mild muscle involvement in the calf is  suspected. The visible major vascular structures including the popliteal artery, runoff vessels, and veins are enhancing and appear to be patent. IMPRESSION: 1. Widespread soft tissue edema compatible with cellulitis. Possible associated myositis. 2. But no acute osseous abnormality. No associated abscess or other complicating features. 3. Total knee arthroplasty. Electronically Signed   By: Genevie Ann M.D.   On: 12/30/2020 04:11    Scheduled Meds: . amphetamine-dextroamphetamine  20 mg Oral BID  . Chlorhexidine Gluconate Cloth  6 each Topical Q0600  . DULoxetine  60 mg Oral  QPC lunch  . enoxaparin (LOVENOX) injection  40 mg Subcutaneous Q24H  . melatonin  2.5 mg Oral QHS  . metoCLOPramide  5 mg Oral TID AC  . mometasone-formoterol  2 puff Inhalation BID  . mupirocin ointment  1 application Nasal BID  . pravastatin  20 mg Oral Daily  . pregabalin  100 mg Oral TID  . QUEtiapine  800 mg Oral QHS   Continuous Infusions: . cefTRIAXone (ROCEPHIN)  IV 1 g (12/30/20 2231)  . promethazine (PHENERGAN) injection (IM or IVPB)    . vancomycin       LOS: 2 days   Time spent: 25 minutes.  Patrecia Pour, MD [facility] [email or web address] 12/31/2020, 12:14 PM

## 2020-12-31 NOTE — Plan of Care (Signed)

## 2021-01-01 ENCOUNTER — Emergency Department: Payer: Medicaid Other

## 2021-01-01 ENCOUNTER — Inpatient Hospital Stay (HOSPITAL_COMMUNITY)
Admission: EM | Admit: 2021-01-01 | Discharge: 2021-01-07 | Disposition: A | Discharge status: Home / Self Care | Payer: Medicaid Other | Source: Home / Self Care | Attending: Internal Medicine | Admitting: Internal Medicine

## 2021-01-01 ENCOUNTER — Inpatient Hospital Stay: Payer: Medicaid Other

## 2021-01-01 ENCOUNTER — Other Ambulatory Visit: Payer: Self-pay

## 2021-01-01 DIAGNOSIS — M549 Dorsalgia, unspecified: Secondary | ICD-10-CM | POA: Diagnosis present

## 2021-01-01 DIAGNOSIS — M797 Fibromyalgia: Secondary | ICD-10-CM | POA: Diagnosis present

## 2021-01-01 DIAGNOSIS — F3289 Other specified depressive episodes: Secondary | ICD-10-CM | POA: Diagnosis not present

## 2021-01-01 DIAGNOSIS — E785 Hyperlipidemia, unspecified: Secondary | ICD-10-CM | POA: Diagnosis present

## 2021-01-01 DIAGNOSIS — Z7951 Long term (current) use of inhaled steroids: Secondary | ICD-10-CM

## 2021-01-01 DIAGNOSIS — L97929 Non-pressure chronic ulcer of unspecified part of left lower leg with unspecified severity: Secondary | ICD-10-CM | POA: Diagnosis present

## 2021-01-01 DIAGNOSIS — I700 Atherosclerosis of aorta: Secondary | ICD-10-CM | POA: Diagnosis present

## 2021-01-01 DIAGNOSIS — Z91030 Bee allergy status: Secondary | ICD-10-CM

## 2021-01-01 DIAGNOSIS — J439 Emphysema, unspecified: Secondary | ICD-10-CM | POA: Diagnosis present

## 2021-01-01 DIAGNOSIS — Z79899 Other long term (current) drug therapy: Secondary | ICD-10-CM

## 2021-01-01 DIAGNOSIS — L03119 Cellulitis of unspecified part of limb: Secondary | ICD-10-CM | POA: Diagnosis not present

## 2021-01-01 DIAGNOSIS — Z87891 Personal history of nicotine dependence: Secondary | ICD-10-CM

## 2021-01-01 DIAGNOSIS — L02619 Cutaneous abscess of unspecified foot: Secondary | ICD-10-CM | POA: Diagnosis not present

## 2021-01-01 DIAGNOSIS — I69398 Other sequelae of cerebral infarction: Secondary | ICD-10-CM

## 2021-01-01 DIAGNOSIS — Z96653 Presence of artificial knee joint, bilateral: Secondary | ICD-10-CM | POA: Diagnosis present

## 2021-01-01 DIAGNOSIS — F909 Attention-deficit hyperactivity disorder, unspecified type: Secondary | ICD-10-CM | POA: Diagnosis present

## 2021-01-01 DIAGNOSIS — Z885 Allergy status to narcotic agent status: Secondary | ICD-10-CM

## 2021-01-01 DIAGNOSIS — L02416 Cutaneous abscess of left lower limb: Secondary | ICD-10-CM | POA: Diagnosis present

## 2021-01-01 DIAGNOSIS — Z20822 Contact with and (suspected) exposure to covid-19: Secondary | ICD-10-CM | POA: Diagnosis present

## 2021-01-01 DIAGNOSIS — Z882 Allergy status to sulfonamides status: Secondary | ICD-10-CM

## 2021-01-01 DIAGNOSIS — Z825 Family history of asthma and other chronic lower respiratory diseases: Secondary | ICD-10-CM

## 2021-01-01 DIAGNOSIS — G894 Chronic pain syndrome: Secondary | ICD-10-CM

## 2021-01-01 DIAGNOSIS — K219 Gastro-esophageal reflux disease without esophagitis: Secondary | ICD-10-CM | POA: Diagnosis present

## 2021-01-01 DIAGNOSIS — L02612 Cutaneous abscess of left foot: Secondary | ICD-10-CM | POA: Diagnosis present

## 2021-01-01 DIAGNOSIS — Z9119 Patient's noncompliance with other medical treatment and regimen: Secondary | ICD-10-CM

## 2021-01-01 DIAGNOSIS — E039 Hypothyroidism, unspecified: Secondary | ICD-10-CM | POA: Diagnosis present

## 2021-01-01 DIAGNOSIS — Z888 Allergy status to other drugs, medicaments and biological substances status: Secondary | ICD-10-CM

## 2021-01-01 DIAGNOSIS — L03116 Cellulitis of left lower limb: Secondary | ICD-10-CM | POA: Diagnosis present

## 2021-01-01 DIAGNOSIS — Z79890 Hormone replacement therapy (postmenopausal): Secondary | ICD-10-CM

## 2021-01-01 DIAGNOSIS — G629 Polyneuropathy, unspecified: Secondary | ICD-10-CM | POA: Diagnosis present

## 2021-01-01 DIAGNOSIS — D75838 Other thrombocytosis: Secondary | ICD-10-CM | POA: Diagnosis present

## 2021-01-01 DIAGNOSIS — M170 Bilateral primary osteoarthritis of knee: Secondary | ICD-10-CM | POA: Diagnosis present

## 2021-01-01 DIAGNOSIS — G8929 Other chronic pain: Secondary | ICD-10-CM | POA: Diagnosis present

## 2021-01-01 DIAGNOSIS — B999 Unspecified infectious disease: Secondary | ICD-10-CM

## 2021-01-01 DIAGNOSIS — Z886 Allergy status to analgesic agent status: Secondary | ICD-10-CM

## 2021-01-01 DIAGNOSIS — Z880 Allergy status to penicillin: Secondary | ICD-10-CM

## 2021-01-01 DIAGNOSIS — Z823 Family history of stroke: Secondary | ICD-10-CM

## 2021-01-01 DIAGNOSIS — Z981 Arthrodesis status: Secondary | ICD-10-CM

## 2021-01-01 DIAGNOSIS — Z8249 Family history of ischemic heart disease and other diseases of the circulatory system: Secondary | ICD-10-CM

## 2021-01-01 DIAGNOSIS — Z91041 Radiographic dye allergy status: Secondary | ICD-10-CM

## 2021-01-01 DIAGNOSIS — M25561 Pain in right knee: Secondary | ICD-10-CM | POA: Diagnosis present

## 2021-01-01 DIAGNOSIS — G4700 Insomnia, unspecified: Secondary | ICD-10-CM | POA: Diagnosis present

## 2021-01-01 DIAGNOSIS — F3341 Major depressive disorder, recurrent, in partial remission: Secondary | ICD-10-CM | POA: Diagnosis present

## 2021-01-01 DIAGNOSIS — Z96612 Presence of left artificial shoulder joint: Secondary | ICD-10-CM | POA: Diagnosis present

## 2021-01-01 DIAGNOSIS — D649 Anemia, unspecified: Secondary | ICD-10-CM | POA: Diagnosis present

## 2021-01-01 DIAGNOSIS — F419 Anxiety disorder, unspecified: Secondary | ICD-10-CM | POA: Diagnosis present

## 2021-01-01 DIAGNOSIS — D124 Benign neoplasm of descending colon: Secondary | ICD-10-CM | POA: Diagnosis present

## 2021-01-01 DIAGNOSIS — M25511 Pain in right shoulder: Secondary | ICD-10-CM

## 2021-01-01 LAB — CBC WITH DIFFERENTIAL/PLATELET
Abs Immature Granulocytes: 2.45 10*3/uL — ABNORMAL HIGH (ref 0.00–0.07)
Basophils Absolute: 0.2 10*3/uL — ABNORMAL HIGH (ref 0.0–0.1)
Basophils Relative: 1 %
Eosinophils Absolute: 0.3 10*3/uL (ref 0.0–0.5)
Eosinophils Relative: 1 %
HCT: 32.8 % — ABNORMAL LOW (ref 36.0–46.0)
Hemoglobin: 10.9 g/dL — ABNORMAL LOW (ref 12.0–15.0)
Immature Granulocytes: 9 %
Lymphocytes Relative: 14 %
Lymphs Abs: 3.9 10*3/uL (ref 0.7–4.0)
MCH: 29.8 pg (ref 26.0–34.0)
MCHC: 33.2 g/dL (ref 30.0–36.0)
MCV: 89.6 fL (ref 80.0–100.0)
Monocytes Absolute: 1 10*3/uL (ref 0.1–1.0)
Monocytes Relative: 4 %
Neutro Abs: 19.5 10*3/uL — ABNORMAL HIGH (ref 1.7–7.7)
Neutrophils Relative %: 71 %
Platelets: 532 10*3/uL — ABNORMAL HIGH (ref 150–400)
RBC: 3.66 MIL/uL — ABNORMAL LOW (ref 3.87–5.11)
RDW: 14.8 % (ref 11.5–15.5)
Smear Review: NORMAL
WBC: 27.3 10*3/uL — ABNORMAL HIGH (ref 4.0–10.5)
nRBC: 0 % (ref 0.0–0.2)

## 2021-01-01 LAB — COMPREHENSIVE METABOLIC PANEL
ALT: 36 U/L (ref 0–44)
AST: 25 U/L (ref 15–41)
Albumin: <1 g/dL — ABNORMAL LOW (ref 3.5–5.0)
Alkaline Phosphatase: 199 U/L — ABNORMAL HIGH (ref 38–126)
Anion gap: 12 (ref 5–15)
BUN: 9 mg/dL (ref 8–23)
CO2: 22 mmol/L (ref 22–32)
Calcium: 8.6 mg/dL — ABNORMAL LOW (ref 8.9–10.3)
Chloride: 98 mmol/L (ref 98–111)
Creatinine, Ser: 0.65 mg/dL (ref 0.44–1.00)
GFR, Estimated: >60 mL/min (ref >60)
Glucose, Bld: 110 mg/dL — ABNORMAL HIGH (ref 70–99)
Potassium: 4 mmol/L (ref 3.5–5.1)
Sodium: 132 mmol/L — ABNORMAL LOW (ref 135–145)
Total Bilirubin: 0.5 mg/dL (ref 0.3–1.2)
Total Protein: <3 g/dL — ABNORMAL LOW (ref 6.5–8.1)

## 2021-01-01 LAB — RESP PANEL BY RT-PCR (FLU A&B, COVID) ARPGX2
Influenza A by PCR: NEGATIVE
Influenza B by PCR: NEGATIVE
SARS Coronavirus 2 by RT PCR: NEGATIVE

## 2021-01-01 MED ORDER — MOMETASONE FURO-FORMOTEROL FUM 200-5 MCG/ACT IN AERO
2.0000 | INHALATION_SPRAY | Freq: Two times a day (BID) | RESPIRATORY_TRACT | Status: DC
  Administered 2021-01-01 – 2021-01-06 (×9): 2 via RESPIRATORY_TRACT
  Filled 2021-01-01 (×2): qty 8.8

## 2021-01-01 MED ORDER — SODIUM CHLORIDE 0.9 % IV SOLN
2.0000 g | Freq: Two times a day (BID) | INTRAVENOUS | Status: DC
  Administered 2021-01-01 – 2021-01-06 (×10): 2 g via INTRAVENOUS
  Filled 2021-01-01 (×12): qty 2

## 2021-01-01 MED ORDER — HYDROXYZINE HCL 10 MG PO TABS
10.0000 mg | ORAL_TABLET | Freq: Three times a day (TID) | ORAL | Status: DC | PRN
  Filled 2021-01-01: qty 1

## 2021-01-01 MED ORDER — PANTOPRAZOLE SODIUM 40 MG PO TBEC
40.0000 mg | DELAYED_RELEASE_TABLET | Freq: Every day | ORAL | Status: DC
  Administered 2021-01-02 – 2021-01-07 (×5): 40 mg via ORAL
  Filled 2021-01-01 (×5): qty 1

## 2021-01-01 MED ORDER — ALBUTEROL SULFATE HFA 108 (90 BASE) MCG/ACT IN AERS
2.0000 | INHALATION_SPRAY | Freq: Four times a day (QID) | RESPIRATORY_TRACT | Status: DC | PRN
  Filled 2021-01-01: qty 6.7

## 2021-01-01 MED ORDER — MORPHINE SULFATE (PF) 2 MG/ML IV SOLN
2.0000 mg | INTRAVENOUS | Status: AC | PRN
  Administered 2021-01-01 – 2021-01-02 (×5): 2 mg via INTRAVENOUS
  Filled 2021-01-01 (×5): qty 1

## 2021-01-01 MED ORDER — ACETAMINOPHEN 325 MG PO TABS
650.0000 mg | ORAL_TABLET | Freq: Four times a day (QID) | ORAL | Status: DC | PRN
  Administered 2021-01-01: 650 mg via ORAL
  Filled 2021-01-01: qty 2

## 2021-01-01 MED ORDER — ALPRAZOLAM 0.5 MG PO TABS
1.0000 mg | ORAL_TABLET | Freq: Four times a day (QID) | ORAL | Status: DC | PRN
  Administered 2021-01-01 – 2021-01-07 (×11): 1 mg via ORAL
  Filled 2021-01-01 (×11): qty 2

## 2021-01-01 MED ORDER — ROPINIROLE HCL 1 MG PO TABS
2.0000 mg | ORAL_TABLET | Freq: Every day | ORAL | Status: DC
  Administered 2021-01-01 – 2021-01-06 (×6): 2 mg via ORAL
  Filled 2021-01-01 (×6): qty 2

## 2021-01-01 MED ORDER — HYDROMORPHONE HCL 1 MG/ML IJ SOLN: 0.5000 mg | INTRAMUSCULAR | Status: DC | PRN

## 2021-01-01 MED ORDER — QUETIAPINE FUMARATE 300 MG PO TABS
800.0000 mg | ORAL_TABLET | Freq: Every day | ORAL | Status: DC
  Administered 2021-01-02 – 2021-01-06 (×6): 800 mg via ORAL
  Filled 2021-01-01 (×7): qty 1

## 2021-01-01 MED ORDER — ONDANSETRON HCL 4 MG PO TABS: 4.0000 mg | ORAL_TABLET | Freq: Four times a day (QID) | ORAL | Status: DC | PRN

## 2021-01-01 MED ORDER — ACETAMINOPHEN 650 MG RE SUPP: 650.0000 mg | Freq: Four times a day (QID) | RECTAL | Status: DC | PRN

## 2021-01-01 MED ORDER — DULOXETINE HCL 30 MG PO CPEP
60.0000 mg | ORAL_CAPSULE | Freq: Every day | ORAL | Status: DC
  Administered 2021-01-02 – 2021-01-06 (×3): 60 mg via ORAL
  Filled 2021-01-01 (×3): qty 2

## 2021-01-01 MED ORDER — METOCLOPRAMIDE HCL 5 MG PO TABS
5.0000 mg | ORAL_TABLET | Freq: Three times a day (TID) | ORAL | Status: DC
  Administered 2021-01-02 – 2021-01-07 (×11): 5 mg via ORAL
  Filled 2021-01-01 (×12): qty 1

## 2021-01-01 MED ORDER — EPINEPHRINE 0.3 MG/0.3ML IJ SOAJ
0.3000 mg | Freq: Every day | INTRAMUSCULAR | Status: DC | PRN
  Filled 2021-01-01: qty 0.3

## 2021-01-01 MED ORDER — AMPHETAMINE-DEXTROAMPHETAMINE 5 MG PO TABS
20.0000 mg | ORAL_TABLET | Freq: Two times a day (BID) | ORAL | Status: DC
  Administered 2021-01-02 – 2021-01-07 (×9): 20 mg via ORAL
  Filled 2021-01-01 (×9): qty 4

## 2021-01-01 MED ORDER — VANCOMYCIN HCL IN DEXTROSE 1-5 GM/200ML-% IV SOLN
1000.0000 mg | INTRAVENOUS | Status: DC
  Administered 2021-01-01: 1000 mg via INTRAVENOUS
  Filled 2021-01-01 (×3): qty 200

## 2021-01-01 MED ORDER — SUVOREXANT 20 MG PO TABS: 20.0000 mg | ORAL_TABLET | Freq: Every evening | ORAL | Status: DC | PRN

## 2021-01-01 MED ORDER — ONDANSETRON HCL 4 MG/2ML IJ SOLN: 4.0000 mg | Freq: Four times a day (QID) | INTRAMUSCULAR | Status: DC | PRN

## 2021-01-01 MED ORDER — ENOXAPARIN SODIUM 40 MG/0.4ML IJ SOSY
40.0000 mg | PREFILLED_SYRINGE | INTRAMUSCULAR | Status: DC
  Administered 2021-01-01 – 2021-01-06 (×6): 40 mg via SUBCUTANEOUS
  Filled 2021-01-01 (×6): qty 0.4

## 2021-01-01 MED ORDER — ZIPRASIDONE HCL 40 MG PO CAPS
60.0000 mg | ORAL_CAPSULE | Freq: Every morning | ORAL | Status: DC
  Filled 2021-01-01: qty 1

## 2021-01-01 MED ORDER — PRAVASTATIN SODIUM 20 MG PO TABS
20.0000 mg | ORAL_TABLET | Freq: Every day | ORAL | Status: DC
  Administered 2021-01-01 – 2021-01-07 (×6): 20 mg via ORAL
  Filled 2021-01-01 (×8): qty 1

## 2021-01-01 MED ORDER — LEVOTHYROXINE SODIUM 50 MCG PO TABS
50.0000 ug | ORAL_TABLET | Freq: Every day | ORAL | Status: DC
  Administered 2021-01-03 – 2021-01-07 (×5): 50 ug via ORAL
  Filled 2021-01-01 (×5): qty 1

## 2021-01-01 MED ORDER — OXYCODONE HCL 5 MG PO TABS
5.0000 mg | ORAL_TABLET | ORAL | Status: DC | PRN
  Administered 2021-01-01 – 2021-01-02 (×2): 5 mg via ORAL
  Filled 2021-01-01 (×3): qty 1

## 2021-01-01 MED ORDER — PREGABALIN 50 MG PO CAPS
100.0000 mg | ORAL_CAPSULE | Freq: Three times a day (TID) | ORAL | Status: DC
  Administered 2021-01-01 – 2021-01-04 (×8): 100 mg via ORAL
  Filled 2021-01-01 (×8): qty 2

## 2021-01-01 MED ORDER — SODIUM CHLORIDE 0.9 % IV SOLN: INTRAVENOUS | Status: AC

## 2021-01-01 NOTE — ED Triage Notes (Signed)
Pt to ER via POV. Pt was admitted for infection to L ankle/ foot and left three hours ago because she needed to leave the room. Pt had one word tattoo to L ankle on Friday. Left lower extremity from shin to foot is red, swollen, painful and "numb" feeling.

## 2021-01-01 NOTE — Consult Note (Signed)
Pharmacy Antibiotic Note  Jamie Schultz is a 60 y.o. female admitted on 12/29/2020 with cellulitis. Patient was admitted on 5/1 and left AMA earlier today.  Pharmacy has been consulted for vancomycin and cefepime dosing.  WBC elevated at 27.3, pt afebrile. Patient has documented penicillin allergy but has taken cephalosporins in the past without incident.  Plan: Continue vancomycin 1000mg  IV q24h (estAUC 559, goal AUC 400-550) Start cefepime 2g IV q12h Monitor clinical picture, renal function, vanc levels if needed pending LOT F/U C&S, abx de-escalation, LOT  Height: 5' (152.4 cm) Weight: 51.3 kg (113 lb) IBW/kg (Calculated) : 45.5  Temp (24hrs), Avg:99.5 F (37.5 C), Min:99.4 F (37.4 C), Max:99.6 F (37.6 C)  Recent Labs  Lab 12/29/20 1738 12/29/20 2147 12/30/20 0518 12/31/20 0544 01/01/21 1652  WBC 21.5*  --  19.1* 15.6* 27.3*  CREATININE 0.62  --  0.61 0.64 0.65  LATICACIDVEN 1.1 1.0  --   --   --     Estimated Creatinine Clearance: 53 mL/min (by C-G formula based on SCr of 0.65 mg/dL).    Allergies  Allergen Reactions  . Bee Venom Anaphylaxis and Other (See Comments)    Bees/wasps/yellow jackets  . Keflex [Cephalexin] Anaphylaxis  . Penicillins Anaphylaxis and Other (See Comments)    Has patient had a PCN reaction causing immediate rash, facial/tongue/throat swelling, SOB or lightheadedness with hypotension: Yes Has patient had a PCN reaction causing severe rash involving mucus membranes or skin necrosis: No Has patient had a PCN reaction that required hospitalization Yes, in the hospital already Has patient had a PCN reaction occurring within the last 10 years: Yes If all of the above answers are "NO", then may proceed with Cephalosporin use.   . Sulfa Antibiotics Other (See Comments)    Renal failure  . Hydrocodone Rash and Other (See Comments)    "I couldn't get my breath"  . Ibuprofen Other (See Comments)    Vomiting blood  . Contrast Media [Iodinated  Diagnostic Agents] Other (See Comments)    Patient states she had a previous reaction to iodinated contrast media agents. Premedicate.  . Nsaids Itching  . Codeine Itching and Rash  . Cyclobenzaprine Other (See Comments)    Severe constipation  . Fentanyl Nausea And Vomiting and Other (See Comments)    Severe vomiting  . Methadone Other (See Comments)    Change in mental status  . Neurontin [Gabapentin] Hives  . Tape Itching, Rash and Other (See Comments)    Blisters skin, Please use "paper" tape  . Tegretol [Carbamazepine] Hives and Rash  . Toradol [Ketorolac Tromethamine] Hives and Rash  . Tramadol Hives and Rash    Antimicrobials this admission: Aztreonam 5/1 x1 Vanc 5/1> Ceftriaxone 5/1>  Dose adjustments this admission:  Microbiology results: 5/1 BCx: NGTD  Thank you for allowing pharmacy to be a part of this patient's care.  Darnelle Bos, PharmD 01/01/2021 6:37 PM
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
## 2021-01-01 NOTE — ED Notes (Signed)
Assisting primary RN, pt abx completed and pt provided with a blanket for comfort. AOx4, talking in full sentences with regular and unlabored breathing

## 2021-01-01 NOTE — Progress Notes (Signed)
Pt requested to be discharge AMA. RN addressed pts concerns at bedside. RN provided extensive education regarding risks of pt leaving AMA. Pt stated "I just need to go home and be in my house for a few hours and will return." She is aware of risks and choosing to leave AMA anyway. MD aware.

## 2021-01-02 ENCOUNTER — Inpatient Hospital Stay: Payer: Medicaid Other

## 2021-01-02 DIAGNOSIS — D72828 Other elevated white blood cell count: Secondary | ICD-10-CM

## 2021-01-02 DIAGNOSIS — D75839 Thrombocytosis, unspecified: Secondary | ICD-10-CM | POA: Diagnosis not present

## 2021-01-02 DIAGNOSIS — L02416 Cutaneous abscess of left lower limb: Secondary | ICD-10-CM | POA: Diagnosis not present

## 2021-01-02 DIAGNOSIS — L03116 Cellulitis of left lower limb: Secondary | ICD-10-CM | POA: Diagnosis not present

## 2021-01-02 LAB — CBC
HCT: 26.4 % — ABNORMAL LOW (ref 36.0–46.0)
Hemoglobin: 8.8 g/dL — ABNORMAL LOW (ref 12.0–15.0)
MCH: 29.9 pg (ref 26.0–34.0)
MCHC: 33.3 g/dL (ref 30.0–36.0)
MCV: 89.8 fL (ref 80.0–100.0)
Platelets: 428 10*3/uL — ABNORMAL HIGH (ref 150–400)
RBC: 2.94 MIL/uL — ABNORMAL LOW (ref 3.87–5.11)
RDW: 14.7 % (ref 11.5–15.5)
WBC: 19 10*3/uL — ABNORMAL HIGH (ref 4.0–10.5)
nRBC: 0 % (ref 0.0–0.2)

## 2021-01-02 LAB — BASIC METABOLIC PANEL
Anion gap: 7 (ref 5–15)
BUN: 15 mg/dL (ref 8–23)
CO2: 29 mmol/L (ref 22–32)
Calcium: 8.6 mg/dL — ABNORMAL LOW (ref 8.9–10.3)
Chloride: 102 mmol/L (ref 98–111)
Creatinine, Ser: 0.76 mg/dL (ref 0.44–1.00)
GFR, Estimated: >60 mL/min (ref >60)
Glucose, Bld: 127 mg/dL — ABNORMAL HIGH (ref 70–99)
Potassium: 3.9 mmol/L (ref 3.5–5.1)
Sodium: 138 mmol/L (ref 135–145)

## 2021-01-02 LAB — SEDIMENTATION RATE: Sed Rate: 86 mm/hr — ABNORMAL HIGH (ref 0–30)

## 2021-01-02 LAB — CK: Total CK: 82 U/L (ref 38–234)

## 2021-01-02 MED ORDER — ZIPRASIDONE HCL 40 MG PO CAPS
60.0000 mg | ORAL_CAPSULE | Freq: Every evening | ORAL | Status: DC
  Administered 2021-01-02 – 2021-01-06 (×5): 60 mg via ORAL
  Filled 2021-01-02 (×6): qty 1

## 2021-01-02 MED ORDER — SODIUM CHLORIDE 0.9 % IV BOLUS
250.0000 mL | Freq: Once | INTRAVENOUS | Status: AC
  Administered 2021-01-02: 250 mL via INTRAVENOUS

## 2021-01-02 MED ORDER — METRONIDAZOLE 500 MG/100ML IV SOLN
500.0000 mg | Freq: Three times a day (TID) | INTRAVENOUS | Status: DC
  Administered 2021-01-02 – 2021-01-06 (×12): 500 mg via INTRAVENOUS
  Filled 2021-01-02 (×15): qty 100

## 2021-01-02 MED ORDER — OXYCODONE HCL 5 MG PO TABS
10.0000 mg | ORAL_TABLET | ORAL | Status: DC | PRN
  Administered 2021-01-02 – 2021-01-03 (×3): 10 mg via ORAL
  Filled 2021-01-02 (×3): qty 2

## 2021-01-02 MED ORDER — VANCOMYCIN HCL 1000 MG/200ML IV SOLN
1000.0000 mg | INTRAVENOUS | Status: DC
  Administered 2021-01-02 – 2021-01-04 (×3): 1000 mg via INTRAVENOUS
  Filled 2021-01-02 (×3): qty 200

## 2021-01-02 NOTE — Progress Notes (Signed)
PROGRESS NOTE    Jamie Schultz  EXB:[id number] DOB: [birth year] DOA: 01/01/2021 PCP: Simona Huh, NP    Assessment & Plan:   Principal Problem:   Cellulitis and abscess of left leg Active Problems:   Major depressive disorder, recurrent episode, 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[place])   Hyperlipidemia   Hypothyroidism   Benign neoplasm of descending colon   Bilateral primary osteoarthritis of knee   Chronic pain syndrome   Bilateral chronic knee pain (B (L>R)  Cellulitis & myositis of LLE: continue on IV vanco, ceftriaxone. No abscess or osteomyelitis. Secondary to unhygienic tattoo work. Oxycodone prn for pain. ID consulted  Leukocytosis: secondary to above infection. Continue on IV abxs   Normocytic anemia: H&H are labile. No need for a transfusion currently  Thrombocytosis: etiology unclear, likely secondary to infection. Will continue to monitor   COPD: w/o exacerbation. Continue on bronchodilators and encourage incentive spirometry  Non-compliance: pt left AMA and came back to the hospital a couple hours later on 01/01/21. Discussed the importance of completing treatment   Chronic pain: continue on oxycodone  Depression: severity unknown. Continue on home dose of duloxetine, seroquel & zyprexa  Anxiety: severity unknown. Xanax prn   Peripheral neuropathy: continue on home dose of pregabalin   Insomnia: continue on home dose of melatonin   HLD: continue on statin    Hypothyroidism: continue on home dose of levothyroxine   GERD: continue on PPI   DVT prophylaxis: lovenox  Code Status: full  Family Communication:  Disposition Plan: likely d/c back home   Level of care: Med-Surg   Status is: Inpatient  Remains inpatient appropriate because:Ongoing diagnostic testing needed not appropriate for outpatient work up, IV treatments appropriate due to intensity of illness or inability to take PO and Inpatient level of care appropriate due to severity of  illness   Dispo: The patient is from: Home              Anticipated d/c is to: Home              Patient currently is not medically stable to d/c.   Difficult to place patient Yes    Consultants:   ID   Procedures:    Antimicrobials: vanco, cefepime     Subjective: Pt c/o left foot pain  Objective: Vitals:   01/01/21 1955 01/01/21 2000 01/01/21 2305 01/02/21 0415  BP:  128/86 (!) 147/75 (!) 87/53  Pulse:  91 90 80  Resp:  18 17 17   Temp: 99 F (37.2 C)  99 F (37.2 C) 97.8 F (36.6 C)  TempSrc: Oral     SpO2:  95% 97% 94%  Weight:      Height:        Intake/Output Summary (Last 24 hours) at 01/02/2021 0744 Last data filed at 01/02/2021 0449 Gross per 24 hour  Intake 1058.64 ml  Output --  Net 1058.64 ml   Filed Weights   01/01/21 1634  Weight: 51.3 kg    Examination:  General exam: Appears uncomfortable  Respiratory system: diminished breath sounds b/l  Cardiovascular system: S1 & S2 +. No  rubs, gallops or clicks.  Gastrointestinal system: Abdomen is nondistended, soft and nontender.Normal bowel sounds heard. Central nervous system: Alert and oriented. Moves all extremities  Extremities: LLE is edematous, erythematous, warmth & tenderness to palpation  Psychiatry:  Judgement and insight appear abnormal. Anxious mood and affect     Data Reviewed: I have personally reviewed following labs and imaging  studies  CBC: Recent Labs  Lab 12/29/20 1738 12/30/20 0518 12/31/20 0544 01/01/21 1652 01/02/21 0407  WBC 21.5* 19.1* 15.6* 27.3* 19.0*  NEUTROABS 17.6*  --   --  19.5*  --   HGB 11.1* 11.5* 10.7* 10.9* 8.8*  HCT 33.3* 34.3* 32.5* 32.8* 26.4*  MCV 90.0 90.0 91.8 89.6 89.8  PLT 363 367 397 532* 841*   Basic Metabolic Panel: Recent Labs  Lab 12/29/20 1738 12/30/20 0518 12/31/20 0544 01/01/21 1652 01/02/21 0407  NA 135 136  --  132* 138  K 4.2 4.4  --  4.0 3.9  CL 97* 105  --  98 102  CO2 30 24  --  22 29  GLUCOSE 121* 151*  --  110*  127*  BUN 15 11  --  9 15  CREATININE 0.62 0.61 0.64 0.65 0.76  CALCIUM 8.6* 8.5*  --  8.6* 8.6*   GFR: Estimated Creatinine Clearance: 53 mL/min (by C-G formula based on SCr of 0.76 mg/dL). Liver Function Tests: Recent Labs  Lab 12/29/20 1738 01/01/21 1652  AST 30 25  ALT 42 36  ALKPHOS 167* 199*  BILITOT 0.5 0.5  PROT 5.9* <3.0*  ALBUMIN 2.4* <1.0*   No results for input(s): LIPASE, AMYLASE in the last 168 hours. No results for input(s): AMMONIA in the last 168 hours. Coagulation Profile: Recent Labs  Lab 12/29/20 1738  INR 1.0   Cardiac Enzymes: Recent Labs  Lab 12/30/20 0518 01/02/21 0407  CKTOTAL 155 82   BNP (last 3 results) No results for input(s): PROBNP in the last 8760 hours. HbA1C: No results for input(s): HGBA1C in the last 72 hours. CBG: No results for input(s): GLUCAP in the last 168 hours. Lipid Profile: No results for input(s): CHOL, HDL, LDLCALC, TRIG, CHOLHDL, LDLDIRECT in the last 72 hours. Thyroid Function Tests: No results for input(s): TSH, T4TOTAL, FREET4, T3FREE, THYROIDAB in the last 72 hours. Anemia Panel: No results for input(s): VITAMINB12, FOLATE, FERRITIN, TIBC, IRON, RETICCTPCT in the last 72 hours. Sepsis Labs: Recent Labs  Lab 12/29/20 1738 12/29/20 2147 12/30/20 0518  PROCALCITON  --   --  0.35  LATICACIDVEN 1.1 1.0  --     Recent Results (from the past 240 hour(s))  Culture, blood (Routine x 2)     Status: None (Preliminary result)   Collection Time: 12/29/20  5:38 PM   Specimen: BLOOD  Result Value Ref Range Status   Specimen Description BLOOD BLOOD LEFT FOREARM  Final   Special Requests   Final    BOTTLES DRAWN AEROBIC AND ANAEROBIC Blood Culture results may not be optimal due to an inadequate volume of blood received in culture bottles   Culture   Final    NO GROWTH 4 DAYS Performed at [facility], [address], [place], [place] [address]    Report Status PENDING  Incomplete  Culture, blood  (Routine x 2)     Status: None (Preliminary result)   Collection Time: 12/29/20  5:42 PM   Specimen: BLOOD  Result Value Ref Range Status   Specimen Description BLOOD BLOOD RIGHT FOREARM  Final   Special Requests   Final    BOTTLES DRAWN AEROBIC AND ANAEROBIC Blood Culture results may not be optimal due to an inadequate volume of blood received in culture bottles   Culture   Final    NO GROWTH 4 DAYS Performed at [facility], [address] [place], [place] [address]    Report Status PENDING  Incomplete  SARS CORONAVIRUS 2 (TAT 6-24 HRS) Nasopharyngeal Nasopharyngeal Swab     Status: None   Collection Time: 12/29/20  8:46 PM   Specimen: Nasopharyngeal Swab  Result Value Ref Range Status   SARS Coronavirus 2 NEGATIVE NEGATIVE Final    Comment: (NOTE) SARS-CoV-2 target nucleic acids are NOT DETECTED.  The SARS-CoV-2 RNA is generally detectable in upper and lower respiratory specimens during the acute phase of infection. Negative results do not preclude SARS-CoV-2 infection, do not rule out co-infections with other pathogens, and should not be used as the sole basis for treatment or other patient management decisions. Negative results must be combined with clinical observations, patient history, and epidemiological information. The expected result is Negative.  Fact Sheet for Patients: HairSlick.no  Fact Sheet for Healthcare Providers: quierodirigir.com  This test is not yet approved or cleared by the [place] FDA and  has been authorized for detection and/or diagnosis of SARS-CoV-2 by FDA under an Emergency Use Authorization (EUA). This EUA will remain  in effect (meaning this test can be used) for the duration of the COVID-19 declaration under Se ction 564(b)(1) of the Act, 21 U.S.C. section 360bbb-3(b)(1), unless the authorization is terminated or revoked sooner.  Performed at [facility],  [address] [place], [place] [address]   MRSA PCR Screening     Status: Abnormal   Collection Time: 12/29/20 11:45 PM   Specimen: Nasopharyngeal  Result Value Ref Range Status   MRSA by PCR POSITIVE (A) NEGATIVE Final    Comment:        The GeneXpert MRSA Assay (FDA approved for NASAL specimens only), is one component of a comprehensive MRSA colonization surveillance program. It is not intended to diagnose MRSA infection nor to guide or monitor treatment for MRSA infections. RESULT CALLED TO, READ BACK BY AND VERIFIED WITH: NAKAY PATRICK AT 0148 12/30/20 MF Performed at [facility], [address] [place], [place] [address]   Resp Panel by RT-PCR (Flu A&B, Covid) Nasopharyngeal Swab     Status: None   Collection Time: 01/01/21  9:14 PM   Specimen: Nasopharyngeal Swab; Nasopharyngeal(NP) swabs in vial transport medium  Result Value Ref Range Status   SARS Coronavirus 2 by RT PCR NEGATIVE NEGATIVE Final    Comment: (NOTE) SARS-CoV-2 target nucleic acids are NOT DETECTED.  The SARS-CoV-2 RNA is generally detectable in upper respiratory specimens during the acute phase of infection. The lowest concentration of SARS-CoV-2 viral copies this assay can detect is 138 copies/mL. A negative result does not preclude SARS-Cov-2 infection and should not be used as the sole basis for treatment or other patient management decisions. A negative result may occur with  improper specimen collection/handling, submission of specimen other than nasopharyngeal swab, presence of viral mutation(s) within the areas targeted by this assay, and inadequate number of viral copies(<138 copies/mL). A negative result must be combined with clinical observations, patient history, and epidemiological information. The expected result is Negative.  Fact Sheet for Patients:  BloggerCourse.com  Fact Sheet for Healthcare Providers:   SeriousBroker.it  This test is no t yet approved or cleared by the [place] FDA and  has been authorized for detection and/or diagnosis of SARS-CoV-2 by FDA under an Emergency Use Authorization (EUA). This EUA will remain  in effect (meaning this test can be used) for the duration of the COVID-19 declaration under Section 564(b)(1) of the Act, 21 U.S.C.section 360bbb-3(b)(1), unless the authorization is terminated  or revoked sooner.  Influenza A by PCR NEGATIVE NEGATIVE Final   Influenza B by PCR NEGATIVE NEGATIVE Final    Comment: (NOTE) The Xpert Xpress SARS-CoV-2/FLU/RSV plus assay is intended as an aid in the diagnosis of influenza from Nasopharyngeal swab specimens and should not be used as a sole basis for treatment. Nasal washings and aspirates are unacceptable for Xpert Xpress SARS-CoV-2/FLU/RSV testing.  Fact Sheet for Patients: EntrepreneurPulse.com.au  Fact Sheet for Healthcare Providers: IncredibleEmployment.be  This test is not yet approved or cleared by the [place] FDA and has been authorized for detection and/or diagnosis of SARS-CoV-2 by FDA under an Emergency Use Authorization (EUA). This EUA will remain in effect (meaning this test can be used) for the duration of the COVID-19 declaration under Section 564(b)(1) of the Act, 21 U.S.C. section 360bbb-3(b)(1), unless the authorization is terminated or revoked.  Performed at [facility], [address] [place], [place] [address]          Radiology Studies: DG Ankle Complete Left  Result Date: 01/01/2021 CLINICAL DATA:  Left ankle pain, redness and swelling. EXAM: LEFT ANKLE COMPLETE - 3+ VIEW COMPARISON:  None. FINDINGS: Soft tissues appear diffusely swollen. No soft tissue gas or radiopaque foreign body. No bony or joint abnormality. IMPRESSION: Soft tissue swelling.  Otherwise negative. Electronically Signed   By:  Inge Rise M.D.   On: 01/01/2021 19:35        Scheduled Meds: . amphetamine-dextroamphetamine  20 mg Oral BID  . DULoxetine  60 mg Oral QPC lunch  . enoxaparin (LOVENOX) injection  40 mg Subcutaneous Q24H  . levothyroxine  50 mcg Oral Daily  . metoCLOPramide  5 mg Oral TID AC  . mometasone-formoterol  2 puff Inhalation BID  . pantoprazole  40 mg Oral Daily  . pravastatin  20 mg Oral Daily  . pregabalin  100 mg Oral TID  . QUEtiapine  800 mg Oral QHS  . rOPINIRole  2 mg Oral QHS  . ziprasidone  60 mg Oral q AM   Continuous Infusions: . sodium chloride Stopped (01/02/21 0445)  . ceFEPime (MAXIPIME) IV Stopped (01/01/21 1954)  . vancomycin 1,000 mg (01/01/21 2003)     LOS: 1 day    Time spent: 33 mins    Wyvonnia Dusky, MD [facility] Pager [phone]  If 7PM-7AM, please contact night-coverage 01/02/2021, 7:44 AM
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
## 2021-01-02 NOTE — Progress Notes (Signed)
Per lab urine sample label missing two identifiers. Will need to resend another specimen.   Fuller Mandril, RN

## 2021-01-02 NOTE — Progress Notes (Signed)
Contacted by patient's husband, [name] 336 [phone]. Stated he was upset because he couldn't talk with his wife or find out if she was having surgery tomorrow. Called pt on room phone. She explained situation about her foot. With patients okay, I explained to her husband she was waiting to have an MRI done tomorrow(5/6) and if there was gas formation, they would have to do surgery and potential amputation of her foot, if the infection was that advanced. Patient was tearful and saying she had enough on her with worrying about her foot, and how her life would be impacted if she did lose her foot and was upset with worrying how to explain the situation to her son, and just frustrated getting a tattoo resulted in all this infection, as it seemed like a safe procedure, and she was the only one that got an infection. Attempted to reassure patient, but she remained upset and tearful. I also told her husband she did not feel like talking with anybody right now. Pt stated she thought she would need something to help her to sleep tonight. I told her I would contact MD.

## 2021-01-02 NOTE — TOC Initial Note (Signed)
Transition of Care [facility]) - Initial/Assessment Note    Patient Details  Name: Jamie Schultz MRN: [id number] Date of Birth: 22-Jan-1961  [facility]) CM/SW Contact:    Candie Chroman, LCSW Phone Number: 01/02/2021, 11:23 AM  Clinical Narrative:   Readmission prevention screen complete. CSW met with patient. No supports at bedside. CSW introduced role and explained that discharge planning would be discussed. Patient lives home with her husband. PCP is Simona Huh, NP at [facility] in [place]. Her husband takes her to appointments. [facility]acy [facility] [place]. No issues obtaining medications. No home health or DME use prior to admission. No further concerns. CSW encouraged patient to contact CSW as needed. CSW will continue to follow patient for support and facilitate return home when stable.               Expected Discharge Plan: Home/Self Care Barriers to Discharge: Continued Medical Work up   Patient Goals and CMS Choice        Expected Discharge Plan and Services Expected Discharge Plan: Home/Self Care     Post Acute Care Choice: NA Living arrangements for the past 2 months: Single Family Home                                      Prior Living Arrangements/Services Living arrangements for the past 2 months: Single Family Home Lives with:: Spouse Patient language and need for interpreter reviewed:: Yes Do you feel safe going back to the place where you live?: Yes      Need for Family Participation in Patient Care: Yes (Comment) Care giver support system in place?: Yes (comment)   Criminal Activity/Legal Involvement Pertinent to Current Situation/Hospitalization: No - Comment as needed  Activities of Daily Living Home Assistive Devices/Equipment: None ADL Screening (condition at time of admission) Patient's cognitive ability adequate to safely complete daily activities?: Yes Is the patient deaf or have difficulty hearing?: No Does the  patient have difficulty seeing, even when wearing glasses/contacts?: No Does the patient have difficulty concentrating, remembering, or making decisions?: No Patient able to express need for assistance with ADLs?: Yes Does the patient have difficulty dressing or bathing?: No Independently performs ADLs?: Yes (appropriate for developmental age) Does the patient have difficulty walking or climbing stairs?: No Weakness of Legs: None Weakness of Arms/Hands: None  Permission Sought/Granted                  Emotional Assessment Appearance:: Appears stated age Attitude/Demeanor/Rapport: Engaged,Gracious Affect (typically observed): Accepting,Appropriate,Calm,Pleasant Orientation: : Oriented to Self,Oriented to Place,Oriented to  Time,Oriented to Situation Alcohol / Substance Use: Not Applicable Psych Involvement: No (comment)  Admission diagnosis:  Infection [B99.9] Cellulitis and abscess of left leg [L03.116, L02.416] Patient Active Problem List   Diagnosis Date Noted  . Cellulitis and abscess of left leg 01/01/2021  . Cellulitis and abscess of foot 12/29/2020  . Encephalopathy 11/13/2019  . History of colonic polyps   . Polyp of sigmoid colon   . Rectal polyp   . S/P total knee arthroplasty 02/23/2018  . Primary osteoarthritis of left knee 12/19/2017  . Smoker 09/21/2017  . Aortic atherosclerosis ([place]) 09/18/2017  . Disorder of bone, unspecified 07/27/2017  . Other long term (current) drug therapy 07/27/2017  . Other specified health status 07/27/2017  . Chronic pain syndrome 07/27/2017  . Chronic low back pain [facility] Area of Pain) (B (  L>R) 07/27/2017  . Chronic neck pain (Primary Area of Pain) (Bilateral) (L>R) 07/27/2017  . Chronic bilateral thoracic back pain (Secondary Area of Pain) (B (L>R) 07/27/2017  . Chronic left shoulder pain (Fourth Area of Pain) 07/27/2017  . Chronic pain of lower extremity (Bilateral) (L>R) 07/27/2017  . Bilateral chronic knee pain (B (L>R)  07/27/2017  . LAD (lymphadenopathy) of right cervical region 12/16/2016  . Skin lesions 12/16/2016  . Family history of melanoma 12/16/2016  . Swelling of both lower extremities 10/07/2016  . S/P shoulder replacement 09/29/2016  . Abnormality of gait 07/28/2016  . Paresthesia 07/28/2016  . Bilateral primary osteoarthritis of knee 07/07/2016  . Prediabetes 04/24/2016  . Spinal stenosis of thoracolumbar region 03/24/2016  . Elevated LFTs 09/04/2015  . Spondylolisthesis of lumbar region 07/31/2015  . Cervical stenosis of spinal canal 07/31/2015  . Low libido 06/26/2015  . Muscle wasting 06/14/2015  . Nausea in adult patient 06/14/2015  . Therapeutic opioid induced constipation 06/14/2015  . Blood in stool   . Benign neoplasm of descending colon   . Rectal prolapse 04/20/2015  . Chronic pain of multiple joints 04/19/2015  . Left rotator cuff tear arthropathy 03/28/2015  . Complete tear of left rotator cuff 01/31/2015  . False positive serological test for hepatitis C 03/10/2012  . Hypothyroidism 02/29/2012  . Vitamin D deficiency 02/29/2012  . Atrophic vaginitis 02/29/2012  . Hyperlipidemia 02/11/2012  . 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 Major depressive disorder, recurrent episode, 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[place]) 01/29/2012  . Chronic back pain    PCP:  Simona Huh, NP Pharmacy:   [facility] [phone] - [address], [address] [place] [address] Phone: [phone] Fax: [phone]     Social Determinants of Health (SDOH) Interventions    Readmission Risk Interventions Readmission Risk Prevention Plan 01/02/2021 10/27/2019  Transportation Screening Complete Complete  PCP or Specialist Appt within 3-5 Days Complete Not Complete  Not Complete comments - Got first available appointment with specialist  [place] or [place] - Complete  Social Work Consult for [place]  Planning/Counseling Complete Complete  Palliative Care Screening Not Applicable Not Applicable  Medication Review Press photographer) Complete Complete  Some recent data might be hidden

## 2021-01-03 ENCOUNTER — Inpatient Hospital Stay: Payer: Medicaid Other

## 2021-01-03 ENCOUNTER — Encounter: Payer: Self-pay | Admitting: Internal Medicine

## 2021-01-03 DIAGNOSIS — B999 Unspecified infectious disease: Secondary | ICD-10-CM

## 2021-01-03 DIAGNOSIS — D72828 Other elevated white blood cell count: Secondary | ICD-10-CM | POA: Diagnosis not present

## 2021-01-03 DIAGNOSIS — L02416 Cutaneous abscess of left lower limb: Secondary | ICD-10-CM

## 2021-01-03 DIAGNOSIS — D75839 Thrombocytosis, unspecified: Secondary | ICD-10-CM | POA: Diagnosis not present

## 2021-01-03 DIAGNOSIS — M25511 Pain in right shoulder: Secondary | ICD-10-CM

## 2021-01-03 DIAGNOSIS — L03116 Cellulitis of left lower limb: Secondary | ICD-10-CM | POA: Diagnosis not present

## 2021-01-03 LAB — CBC
HCT: 27.9 % — ABNORMAL LOW (ref 36.0–46.0)
Hemoglobin: 9.3 g/dL — ABNORMAL LOW (ref 12.0–15.0)
MCH: 29.8 pg (ref 26.0–34.0)
MCHC: 33.3 g/dL (ref 30.0–36.0)
MCV: 89.4 fL (ref 80.0–100.0)
Platelets: 527 10*3/uL — ABNORMAL HIGH (ref 150–400)
RBC: 3.12 MIL/uL — ABNORMAL LOW (ref 3.87–5.11)
RDW: 14.8 % (ref 11.5–15.5)
WBC: 16.3 10*3/uL — ABNORMAL HIGH (ref 4.0–10.5)
nRBC: 0 % (ref 0.0–0.2)

## 2021-01-03 LAB — BASIC METABOLIC PANEL
Anion gap: 6 (ref 5–15)
BUN: 9 mg/dL (ref 8–23)
CO2: 25 mmol/L (ref 22–32)
Calcium: 8.3 mg/dL — ABNORMAL LOW (ref 8.9–10.3)
Chloride: 105 mmol/L (ref 98–111)
Creatinine, Ser: 0.53 mg/dL (ref 0.44–1.00)
GFR, Estimated: >60 mL/min (ref >60)
Glucose, Bld: 91 mg/dL (ref 70–99)
Potassium: 4.1 mmol/L (ref 3.5–5.1)
Sodium: 136 mmol/L (ref 135–145)

## 2021-01-03 LAB — CULTURE, BLOOD (ROUTINE X 2)
Culture: NO GROWTH
Culture: NO GROWTH

## 2021-01-03 LAB — HIGH SENSITIVITY CRP: CRP, High Sensitivity: 142.96 mg/L — ABNORMAL HIGH (ref 0.00–3.00)

## 2021-01-03 LAB — VANCOMYCIN, PEAK: Vancomycin Pk: 24 ug/mL — ABNORMAL LOW (ref 30–40)

## 2021-01-03 MED ORDER — IOHEXOL 300 MG/ML  SOLN
100.0000 mL | Freq: Once | INTRAMUSCULAR | Status: AC | PRN
  Administered 2021-01-03: 100 mL via INTRAVENOUS

## 2021-01-03 MED ORDER — MORPHINE SULFATE (PF) 2 MG/ML IV SOLN
1.0000 mg | INTRAVENOUS | Status: DC | PRN
  Administered 2021-01-03 – 2021-01-05 (×8): 1 mg via INTRAVENOUS
  Filled 2021-01-03 (×10): qty 1

## 2021-01-03 MED ORDER — DIPHENHYDRAMINE HCL 50 MG/ML IJ SOLN: 50.0000 mg | Freq: Once | INTRAMUSCULAR | Status: AC

## 2021-01-03 MED ORDER — OXYCODONE HCL 5 MG PO TABS
15.0000 mg | ORAL_TABLET | ORAL | Status: DC | PRN
  Administered 2021-01-03 – 2021-01-07 (×13): 15 mg via ORAL
  Filled 2021-01-03 (×13): qty 3

## 2021-01-03 MED ORDER — SODIUM CHLORIDE 0.9 % IV SOLN
INTRAVENOUS | Status: DC | PRN
  Administered 2021-01-03 – 2021-01-04 (×3): 1000 mL via INTRAVENOUS

## 2021-01-03 MED ORDER — HYDROCORTISONE NA SUCCINATE PF 250 MG IJ SOLR
200.0000 mg | Freq: Once | INTRAMUSCULAR | Status: AC
  Administered 2021-01-03: 200 mg via INTRAVENOUS
  Filled 2021-01-03: qty 200

## 2021-01-03 MED ORDER — DOCUSATE SODIUM 100 MG PO CAPS
200.0000 mg | ORAL_CAPSULE | Freq: Two times a day (BID) | ORAL | Status: DC
  Administered 2021-01-03 – 2021-01-07 (×7): 200 mg via ORAL
  Filled 2021-01-03 (×7): qty 2

## 2021-01-03 MED ORDER — MELATONIN 3 MG PO TABS
3.0000 mg | ORAL_TABLET | Freq: Every evening | ORAL | Status: DC | PRN
  Filled 2021-01-03: qty 1

## 2021-01-03 MED ORDER — GADOBUTROL 1 MMOL/ML IV SOLN
5.0000 mL | Freq: Once | INTRAVENOUS | Status: AC | PRN
  Administered 2021-01-03: 5 mL via INTRAVENOUS

## 2021-01-03 MED ORDER — DIPHENHYDRAMINE HCL 25 MG PO CAPS
50.0000 mg | ORAL_CAPSULE | Freq: Once | ORAL | Status: AC
  Administered 2021-01-03: 50 mg via ORAL
  Filled 2021-01-03: qty 2
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
## 2021-01-03 NOTE — Anesthesia Preprocedure Evaluation (Addendum)
Anesthesia Evaluation  Patient identified by MRN, date of birth, ID band Patient awake    Reviewed: Allergy & Precautions, NPO status , Patient's Chart, lab work & pertinent test results  Airway Mallampati: III  TM Distance: <3 FB     Dental  (+) Upper Dentures, Lower Dentures   Pulmonary COPD, former smoker,    Pulmonary exam normal        Cardiovascular negative cardio ROS Normal cardiovascular exam     Neuro/Psych PSYCHIATRIC DISORDERS Anxiety Depression negative neurological ROS     GI/Hepatic Neg liver ROS,   Endo/Other  Hypothyroidism   Renal/GU Renal disease     Musculoskeletal  (+) Arthritis , Osteoarthritis,    Abdominal Normal abdominal exam  (+)   Peds negative pediatric ROS (+)  Hematology negative hematology ROS (+)   Anesthesia Other Findings Past Medical History: No date: Abscess No date: ADD (attention deficit disorder) No date: ADHD     Comment:  patient denies No date: Allergy No date: Anxiety No date: Arthritis No date: Chronic back pain 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 No date: COPD (chronic obstructive pulmonary disease) (HCC) No date: Fibromyalgia No date: History of kidney stones No date: Kidney failure     Comment:  acute - reaction to sulfa drugs No date: MRSA (methicillin resistant staph aureus) culture positive     Comment:  Hx: of No date: Muscle spasm     Comment:  arms No date: Muscle spasms of both lower extremities No date: Numbness No date: Panic attack No date: Pneumonia     Comment:  2009 No date: Wears dentures     Comment:  full upper and lower   Reproductive/Obstetrics                            Anesthesia Physical Anesthesia Plan  ASA: III and emergent  Anesthesia Plan: General   Post-op Pain Management:    Induction: Intravenous  PONV Risk Score and Plan:   Airway Management Planned: Oral ETT  Additional Equipment:    Intra-op Plan:   Post-operative Plan: Extubation in OR  Informed Consent: I have reviewed the patients History and Physical, chart, labs and discussed the procedure including the risks, benefits and alternatives for the proposed anesthesia with the patient or authorized representative who has indicated his/her understanding and acceptance.     Dental advisory given  Plan Discussed with: CRNA and Surgeon  Anesthesia Plan Comments:         Anesthesia Quick Evaluation                                   Anesthesia Evaluation  Patient identified by MRN, date of birth, ID band Patient awake    Reviewed: Allergy & Precautions, NPO status , Patient's Chart, lab work & pertinent test results  Airway Mallampati: II  TM Distance: >3 FB Neck ROM: Full    Dental  (+) Edentulous Upper, Edentulous Lower   Pulmonary COPD,  COPD inhaler, Patient abstained from smoking., former smoker,    Pulmonary exam normal breath sounds clear to auscultation       Cardiovascular Exercise Tolerance: Good negative cardio ROS Normal cardiovascular exam Rhythm:Regular Rate:Normal     Neuro/Psych PSYCHIATRIC DISORDERS Anxiety Depression  Neuromuscular disease    GI/Hepatic Neg liver ROS,  RECTAL PROLAPSE   Endo/Other  Hypothyroidism   Renal/GU negative Renal ROS  Musculoskeletal  (+) Arthritis , Fibromyalgia -  Abdominal   Peds  (+) ADHD Hematology negative hematology ROS (+)   Anesthesia Other Findings Day of surgery medications reviewed with the patient.  Reproductive/Obstetrics                             Lab Results  Component Value Date   WBC 16.1 (H) 01/04/2021   HGB 10.6 (L) 01/04/2021   HCT 31.5 (L) 01/04/2021   MCV 88.2 01/04/2021   PLT 620 (H) 01/04/2021   Lab Results  Component Value Date   CREATININE 0.62 01/04/2021   BUN 10 01/04/2021   NA 138 01/04/2021   K 4.1 01/04/2021   CL 104 01/04/2021   CO2 26 01/04/2021     Anesthesia Physical  Anesthesia Plan  ASA: III  Anesthesia Plan: General   Post-op Pain Management:    Induction: Intravenous  PONV Risk Score and Plan: 4 or greater and Diphenhydramine, Scopolamine patch - Pre-op, Midazolam, Dexamethasone and Ondansetron  Airway Management Planned: Oral ETT  Additional Equipment:   Intra-op Plan:   Post-operative Plan: Extubation in OR  Informed Consent: I have reviewed the patients History and Physical, chart, labs and discussed the procedure including the risks, benefits and alternatives for the proposed anesthesia with the patient or authorized representative who has indicated his/her understanding and acceptance.     Dental advisory given  Plan Discussed with: CRNA  Anesthesia Plan Comments:         Anesthesia Quick Evaluation

## 2021-01-03 NOTE — Progress Notes (Signed)
PROGRESS NOTE    Jamie Schultz  [id number] DOB: 1961-04-18 DOA: 01/01/2021 PCP: Simona Huh, NP    Assessment & Plan:   Principal Problem:   Cellulitis and abscess of left leg Active Problems:   Major depressive disorder, recurrent episode, 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[facility])   Hyperlipidemia   Hypothyroidism   Benign neoplasm of descending colon   Bilateral primary osteoarthritis of knee   Chronic pain syndrome   Bilateral chronic knee pain (B (L>R)  LLE abscess & cellulitis: now w/ abscess as per MRI. Continue on IV flagyl, cefepime & vanco as per ID. Angiogram will be 01/06/21 as per vascular surg. Will go for I&D tomorrow w/ general surg. NPO after midnight.  Leukocytosis: secondary to above infection. Continue on IV abxs   Normocytic anemia: H&H are labile. No need for a transfusion currently   Thrombocytosis: etiology unclear, likely secondary to infection   COPD: w/o exacerbation. Continue on bronchodilators and encourage incentive spirometry  Non-compliance: pt left AMA and came back to the hospital a couple hours later on 01/01/21. Discussed the importance of completing treatment   Chronic pain: continue on oxycodone   Depression: severity unknown. Continue on home dose of seroquel, duloxetine, zyprexa  Anxiety: severity unknown. Xanax prn   Peripheral neuropathy: continue on home dose of pregabalin   Insomnia: continue on home dose of melatonin   HLD: continue on statin   Hypothyroidism: continue on home dose of levothyroxine   GERD: continue on PPI   DVT prophylaxis: lovenox  Code Status: full  Family Communication:  Disposition Plan: likely d/c back home   Level of care: Med-Surg   Status is: Inpatient  Remains inpatient appropriate because:Ongoing diagnostic testing needed not appropriate for outpatient work up, IV treatments appropriate due to intensity of illness or inability to take PO and Inpatient level of care appropriate  due to severity of illness   Dispo: The patient is from: Home              Anticipated d/c is to: Home              Patient currently is not medically stable to d/c.   Difficult to place patient Yes    Consultants:   ID   Procedures:    Antimicrobials: vanco, cefepime     Subjective: Pt c/o left foot pain   Objective: Vitals:   01/02/21 1113 01/02/21 1507 01/02/21 2027 01/03/21 0548  BP: 108/76 (!) 169/89 107/84 101/69  Pulse: 95 86 89 82  Resp: 18 18 16 18   Temp: 99 F (37.2 C) 98 F (36.7 C) 98.7 F (37.1 C) 98.2 F (36.8 C)  TempSrc: Oral Oral Oral Oral  SpO2: 94% 100% 93% 95%  Weight:      Height:        Intake/Output Summary (Last 24 hours) at 01/03/2021 0744 Last data filed at 01/03/2021 0429 Gross per 24 hour  Intake 1355.07 ml  Output --  Net 1355.07 ml   Filed Weights   01/01/21 1634  Weight: 51.3 kg    Examination:  General exam: Appears have pressured speech.  Respiratory system: decreased breath sounds b/l  Cardiovascular system: S1/S2+. No clicks or rubs  Gastrointestinal system: Abd is soft, NT, ND & hypoactive bowel sounds  Central nervous system: Alert and oriented. Moves all extremities  Extremities: LLE is edematous, erythematous, warmth & tenderness to palpation  Psychiatry:  Judgement and insight appear abnormal. Pressured speech & tangential thinking  Data Reviewed: I have personally reviewed following labs and imaging studies  CBC: Recent Labs  Lab 12/29/20 1738 12/30/20 0518 12/31/20 0544 01/01/21 1652 01/02/21 0407  WBC 21.5* 19.1* 15.6* 27.3* 19.0*  NEUTROABS 17.6*  --   --  19.5*  --   HGB 11.1* 11.5* 10.7* 10.9* 8.8*  HCT 33.3* 34.3* 32.5* 32.8* 26.4*  MCV 90.0 90.0 91.8 89.6 89.8  PLT 363 367 397 532* 409*   Basic Metabolic Panel: Recent Labs  Lab 12/29/20 1738 12/30/20 0518 12/31/20 0544 01/01/21 1652 01/02/21 0407  NA 135 136  --  132* 138  K 4.2 4.4  --  4.0 3.9  CL 97* 105  --  98 102  CO2 30  24  --  22 29  GLUCOSE 121* 151*  --  110* 127*  BUN 15 11  --  9 15  CREATININE 0.62 0.61 0.64 0.65 0.76  CALCIUM 8.6* 8.5*  --  8.6* 8.6*   GFR: Estimated Creatinine Clearance: 53 mL/min (by C-G formula based on SCr of 0.76 mg/dL). Liver Function Tests: Recent Labs  Lab 12/29/20 1738 01/01/21 1652  AST 30 25  ALT 42 36  ALKPHOS 167* 199*  BILITOT 0.5 0.5  PROT 5.9* <3.0*  ALBUMIN 2.4* <1.0*   No results for input(s): LIPASE, AMYLASE in the last 168 hours. No results for input(s): AMMONIA in the last 168 hours. Coagulation Profile: Recent Labs  Lab 12/29/20 1738  INR 1.0   Cardiac Enzymes: Recent Labs  Lab 12/30/20 0518 01/02/21 0407  CKTOTAL 155 82   BNP (last 3 results) No results for input(s): PROBNP in the last 8760 hours. HbA1C: No results for input(s): HGBA1C in the last 72 hours. CBG: No results for input(s): GLUCAP in the last 168 hours. Lipid Profile: No results for input(s): CHOL, HDL, LDLCALC, TRIG, CHOLHDL, LDLDIRECT in the last 72 hours. Thyroid Function Tests: No results for input(s): TSH, T4TOTAL, FREET4, T3FREE, THYROIDAB in the last 72 hours. Anemia Panel: No results for input(s): VITAMINB12, FOLATE, FERRITIN, TIBC, IRON, RETICCTPCT in the last 72 hours. Sepsis Labs: Recent Labs  Lab 12/29/20 1738 12/29/20 2147 12/30/20 0518  PROCALCITON  --   --  0.35  LATICACIDVEN 1.1 1.0  --     Recent Results (from the past 240 hour(s))  Culture, blood (Routine x 2)     Status: None   Collection Time: 12/29/20  5:38 PM   Specimen: BLOOD  Result Value Ref Range Status   Specimen Description BLOOD BLOOD LEFT FOREARM  Final   Special Requests   Final    BOTTLES DRAWN AEROBIC AND ANAEROBIC Blood Culture results may not be optimal due to an inadequate volume of blood received in culture bottles   Culture   Final    NO GROWTH 5 DAYS Performed at [facility], [address] [place], [place] [address]    Report Status 01/03/2021 FINAL   Final  Culture, blood (Routine x 2)     Status: None   Collection Time: 12/29/20  5:42 PM   Specimen: BLOOD  Result Value Ref Range Status   Specimen Description BLOOD BLOOD RIGHT FOREARM  Final   Special Requests   Final    BOTTLES DRAWN AEROBIC AND ANAEROBIC Blood Culture results may not be optimal due to an inadequate volume of blood received in culture bottles   Culture   Final    NO GROWTH 5 DAYS Performed at [facility], [address] [place], [place] [address]  Report Status 01/03/2021 FINAL  Final  SARS CORONAVIRUS 2 (TAT 6-24 HRS) Nasopharyngeal Nasopharyngeal Swab     Status: None   Collection Time: 12/29/20  8:46 PM   Specimen: Nasopharyngeal Swab  Result Value Ref Range Status   SARS Coronavirus 2 NEGATIVE NEGATIVE Final    Comment: (NOTE) SARS-CoV-2 target nucleic acids are NOT DETECTED.  The SARS-CoV-2 RNA is generally detectable in upper and lower respiratory specimens during the acute phase of infection. Negative results do not preclude SARS-CoV-2 infection, do not rule out co-infections with other pathogens, and should not be used as the sole basis for treatment or other patient management decisions. Negative results must be combined with clinical observations, patient history, and epidemiological information. The expected result is Negative.  Fact Sheet for Patients: SugarRoll.be  Fact Sheet for Healthcare Providers: https://www.woods-mathews.com/  This test is not yet approved or cleared by the [place] FDA and  has been authorized for detection and/or diagnosis of SARS-CoV-2 by FDA under an Emergency Use Authorization (EUA). This EUA will remain  in effect (meaning this test can be used) for the duration of the COVID-19 declaration under Se ction 564(b)(1) of the Act, 21 U.S.C. section 360bbb-3(b)(1), unless the authorization is terminated or revoked sooner.  Performed at [facility], [place] [address], [place], [place] [address]   MRSA PCR Screening     Status: Abnormal   Collection Time: 12/29/20 11:45 PM   Specimen: Nasopharyngeal  Result Value Ref Range Status   MRSA by PCR POSITIVE (A) NEGATIVE Final    Comment:        The GeneXpert MRSA Assay (FDA approved for NASAL specimens only), is one component of a comprehensive MRSA colonization surveillance program. It is not intended to diagnose MRSA infection nor to guide or monitor treatment for MRSA infections. RESULT CALLED TO, READ BACK BY AND VERIFIED WITH: NAKAY PATRICK AT 0148 12/30/20 MF Performed at [facility], [address] [place], [place] [address]   Resp Panel by RT-PCR (Flu A&B, Covid) Nasopharyngeal Swab     Status: None   Collection Time: 01/01/21  9:14 PM   Specimen: Nasopharyngeal Swab; Nasopharyngeal(NP) swabs in vial transport medium  Result Value Ref Range Status   SARS Coronavirus 2 by RT PCR NEGATIVE NEGATIVE Final    Comment: (NOTE) SARS-CoV-2 target nucleic acids are NOT DETECTED.  The SARS-CoV-2 RNA is generally detectable in upper respiratory specimens during the acute phase of infection. The lowest concentration of SARS-CoV-2 viral copies this assay can detect is 138 copies/mL. A negative result does not preclude SARS-Cov-2 infection and should not be used as the sole basis for treatment or other patient management decisions. A negative result may occur with  improper specimen collection/handling, submission of specimen other than nasopharyngeal swab, presence of viral mutation(s) within the areas targeted by this assay, and inadequate number of viral copies(<138 copies/mL). A negative result must be combined with clinical observations, patient history, and epidemiological information. The expected result is Negative.  Fact Sheet for Patients:  EntrepreneurPulse.com.au  Fact Sheet for Healthcare Providers:   IncredibleEmployment.be  This test is no t yet approved or cleared by the [place] FDA and  has been authorized for detection and/or diagnosis of SARS-CoV-2 by FDA under an Emergency Use Authorization (EUA). This EUA will remain  in effect (meaning this test can be used) for the duration of the COVID-19 declaration under Section 564(b)(1) of the Act, 21 U.S.C.section 360bbb-3(b)(1), unless the authorization is terminated  or revoked sooner.       Influenza A by PCR NEGATIVE NEGATIVE Final   Influenza B by PCR NEGATIVE NEGATIVE Final    Comment: (NOTE) The Xpert Xpress SARS-CoV-2/FLU/RSV plus assay is intended as an aid in the diagnosis of influenza from Nasopharyngeal swab specimens and should not be used as a sole basis for treatment. Nasal washings and aspirates are unacceptable for Xpert Xpress SARS-CoV-2/FLU/RSV testing.  Fact Sheet for Patients: EntrepreneurPulse.com.au  Fact Sheet for Healthcare Providers: IncredibleEmployment.be  This test is not yet approved or cleared by the [place] FDA and has been authorized for detection and/or diagnosis of SARS-CoV-2 by FDA under an Emergency Use Authorization (EUA). This EUA will remain in effect (meaning this test can be used) for the duration of the COVID-19 declaration under Section 564(b)(1) of the Act, 21 U.S.C. section 360bbb-3(b)(1), unless the authorization is terminated or revoked.  Performed at [facility], [place], [place] [address]   Aerobic Culture w Gram Stain (superficial specimen)     Status: None (Preliminary result)   Collection Time: 01/02/21  2:15 PM   Specimen: Wound  Result Value Ref Range Status   Specimen Description   Final    WOUND Performed at [facility], [address], [place], [place] [address]    Special Requests   Final    LL Performed at [facility], [address]  [place], [place] [address]    Gram Stain   Final    FEW WBC PRESENT, PREDOMINANTLY PMN NO ORGANISMS SEEN Performed at [facility], [place] [address], [place], [place] [address]    Culture PENDING  Incomplete   Report Status PENDING  Incomplete         Radiology Studies: DG Ankle Complete Left  Result Date: 01/01/2021 CLINICAL DATA:  Left ankle pain, redness and swelling. EXAM: LEFT ANKLE COMPLETE - 3+ VIEW COMPARISON:  None. FINDINGS: Soft tissues appear diffusely swollen. No soft tissue gas or radiopaque foreign body. No bony or joint abnormality. IMPRESSION: Soft tissue swelling.  Otherwise negative. Electronically Signed   By: Inge Rise M.D.   On: 01/01/2021 19:35        Scheduled Meds: . amphetamine-dextroamphetamine  20 mg Oral BID  . DULoxetine  60 mg Oral QPC lunch  . enoxaparin (LOVENOX) injection  40 mg Subcutaneous Q24H  . levothyroxine  50 mcg Oral Daily  . metoCLOPramide  5 mg Oral TID AC  . mometasone-formoterol  2 puff Inhalation BID  . pantoprazole  40 mg Oral Daily  . pravastatin  20 mg Oral Daily  . pregabalin  100 mg Oral TID  . QUEtiapine  800 mg Oral QHS  . rOPINIRole  2 mg Oral QHS  . ziprasidone  60 mg Oral QPM   Continuous Infusions: . ceFEPime (MAXIPIME) IV 2 g (01/03/21 0545)  . metronidazole 500 mg (01/03/21 0640)  . vancomycin Stopped (01/02/21 2202)     LOS: 2 days    Time spent: 36 mins    Wyvonnia Dusky, MD [facility] Pager [phone]  If 7PM-7AM, please contact night-coverage 01/03/2021, 7:44 AM

## 2021-01-03 NOTE — Consult Note (Signed)
Pharmacy Antibiotic Note  Jamie Schultz is a 60 y.o. female admitted on 12/29/2020 with cellulitis. Patient was admitted on 5/1 and left AMA earlier today.  Pharmacy has been consulted for vancomycin and cefepime dosing.  WBC elevated at 27.3, pt afebrile. Patient has documented penicillin allergy but has taken cephalosporins in the past without incident.  Plan: Continue vancomycin 1000mg  IV q24h (estAUC 559, goal AUC 400-550)  Continue cefepime 2g IV q12h  Monitor clinical picture, renal function, vanc levels tonight (6th dose)  Pt still in need of surgical debridement - wound cultures pending  Height: 5' (152.4 cm) Weight: 51.3 kg (113 lb) IBW/kg (Calculated) : 45.5  Temp (24hrs), Avg:98.6 F (37 C), Min:98 F (36.7 C), Max:99.5 F (37.5 C)  Recent Labs  Lab 12/29/20 1738 12/29/20 2147 12/30/20 0518 12/31/20 0544 01/01/21 1652 01/02/21 0407 01/03/21 0801  WBC 21.5*  --  19.1* 15.6* 27.3* 19.0* 16.3*  CREATININE 0.62  --  0.61 0.64 0.65 0.76 0.53  LATICACIDVEN 1.1 1.0  --   --   --   --   --     Estimated Creatinine Clearance: 53 mL/min (by C-G formula based on SCr of 0.53 mg/dL).    Allergies  Allergen Reactions  . Bee Venom Anaphylaxis and Other (See Comments)    Bees/wasps/yellow jackets  . Keflex [Cephalexin] Anaphylaxis  . Penicillins Anaphylaxis and Other (See Comments)    Has patient had a PCN reaction causing immediate rash, facial/tongue/throat swelling, SOB or lightheadedness with hypotension: Yes Has patient had a PCN reaction causing severe rash involving mucus membranes or skin necrosis: No Has patient had a PCN reaction that required hospitalization Yes, in the hospital already Has patient had a PCN reaction occurring within the last 10 years: Yes If all of the above answers are "NO", then may proceed with Cephalosporin use.   . Sulfa Antibiotics Other (See Comments)    Renal failure  . Hydrocodone Rash and Other (See Comments)    "I couldn't get  my breath"  . Ibuprofen Other (See Comments)    Vomiting blood  . Contrast Media [Iodinated Diagnostic Agents] Other (See Comments)    Patient states she had a previous reaction to iodinated contrast media agents. Premedicate.  . Nsaids Itching  . Codeine Itching and Rash  . Cyclobenzaprine Other (See Comments)    Severe constipation  . Fentanyl Nausea And Vomiting and Other (See Comments)    Severe vomiting  . Methadone Other (See Comments)    Change in mental status  . Neurontin [Gabapentin] Hives  . Tape Itching, Rash and Other (See Comments)    Blisters skin, Please use "paper" tape  . Tegretol [Carbamazepine] Hives and Rash  . Toradol [Ketorolac Tromethamine] Hives and Rash  . Tramadol Hives and Rash    Antimicrobials this admission: Aztreonam 5/1 x1 Vanc 5/1> Ceftriaxone 5/1>5/3 Cefepime 5/3 >>  Metronidazole 5/5 >>  Dose adjustments this admission: none  Microbiology results: 5/1 BCx: NGTD  Thank you for allowing pharmacy to be a part of this patient's care.  Lu Duffel, PharmD, BCPS Clinical Pharmacist 01/03/2021 1:53 PM
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
## 2021-01-03 NOTE — Progress Notes (Signed)
Patient to MRI without Solu-Cortef being given 4 hours in advance per instructions. Dr. Jimmye Norman notified via secure chat and stated to hold Solu-Cortef at this time and alert her to any reactions from contrast.

## 2021-01-03 NOTE — Progress Notes (Signed)
Subjective/Chief Complaint: Seen.  Still having severe pain in her left foot and leg.   Objective: Vital signs in last 24 hours: Temp:  [98 F (36.7 C)-99.5 F (37.5 C)] 99.5 F (37.5 C) (05/06 1245) Pulse Rate:  [82-91] 91 (05/06 1245) Resp:  [15-20] 15 (05/06 1245) BP: (101-169)/(67-89) 138/83 (05/06 1245) SpO2:  [92 %-100 %] 96 % (05/06 1245) Last BM Date: 01/01/21  Intake/Output from previous day: 05/05 0701 - 05/06 0700 In: 1355.1 [P.O.:600; I.V.:155; IV Piggyback:600] Out: -  Intake/Output this shift: No intake/output data recorded.  Foot and leg remains pretty much the same as yesterday.  Currently unwrapped.  Lab Results:  Recent Labs    01/02/21 0407 01/03/21 0801  WBC 19.0* 16.3*  HGB 8.8* 9.3*  HCT 26.4* 27.9*  PLT 428* 527*   BMET Recent Labs    01/02/21 0407 01/03/21 0801  NA 138 136  K 3.9 4.1  CL 102 105  CO2 29 25  GLUCOSE 127* 91  BUN 15 9  CREATININE 0.76 0.53  CALCIUM 8.6* 8.3*   PT/INR No results for input(s): LABPROT, INR in the last 72 hours. ABG No results for input(s): PHART, HCO3 in the last 72 hours.  Invalid input(s): PCO2, PO2  Studies/Results: DG Ankle Complete Left  Result Date: 01/01/2021 CLINICAL DATA:  Left ankle pain, redness and swelling. EXAM: LEFT ANKLE COMPLETE - 3+ VIEW COMPARISON:  None. FINDINGS: Soft tissues appear diffusely swollen. No soft tissue gas or radiopaque foreign body. No bony or joint abnormality. IMPRESSION: Soft tissue swelling.  Otherwise negative. Electronically Signed   By: Inge Rise M.D.   On: 01/01/2021 19:35   MR ANKLE LEFT WO CONTRAST  Result Date: 01/03/2021 CLINICAL DATA:  Left ankle cellulitis EXAM: MRI OF THE LEFT ANKLE WITHOUT CONTRAST TECHNIQUE: Multiplanar, multisequence MR imaging of the ankle was performed. No intravenous contrast was administered. COMPARISON:  X-ray 01/01/2021, CT 12/30/2020 FINDINGS: TENDONS Peroneal: Tendinosis with partial interstitial tear of the  retro-malleolar and proximal infra-malleolar portions of the peroneus longus tendon (series 4, images 16-21). Peroneus brevis tendon appears intact without evidence of tear. Trace tenosynovial fluid. Posteromedial: Intact tibialis posterior, flexor hallucis longus and flexor digitorum longus tendons. Anterior: Intact tibialis anterior, extensor hallucis longus and extensor digitorum longus tendons. Achilles: Intact. Plantar Fascia: Intact. LIGAMENTS Lateral: Anterior talofibular ligament is attenuated, likely reflecting sequela of remote trauma. Posterior talofibular and calcaneofibular ligaments appear grossly intact. Intact anterior and posterior tibiofibular ligaments. Medial: Deltoid ligament and spring ligament complex intact. CARTILAGE Ankle Joint: No joint effusion or chondral defect. Subtalar Joints/Sinus Tarsi: No joint effusion or chondral defect. Preservation of the anatomic fat within the sinus tarsi. Bones: No acute fracture. No dislocation. No cortical destruction or periostitis. No bone marrow edema. No marrow replacing bone lesion. Other: Marked circumferential subcutaneous edema and fluid throughout the visualized lower leg, ankle, and foot. Developing, partially defined lobulated fluid collection within the subcutaneous soft tissues overlying the distal fibula in total measuring approximately 7.5 x 1.0 x 6.5 cm (series 7, image 25; series 5, image 5). Deep fascial edema, not out of proportion to the degree of subcutaneous edema. No deep fascial fluid collections are seen. No intramuscular fluid collections. IMPRESSION: 1. Marked circumferential subcutaneous edema and fluid throughout the visualized lower leg, ankle, and foot compatible with cellulitis. Partially defined lobulated fluid collection within the subcutaneous soft tissues laterally overlying the distal fibula in total measuring 7.5 x 1.0 x 6.5 cm compatible with phlegmon/developing abscess. 2. No evidence of  osteomyelitis. 3. 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 Electronically Signed   By: Davina Poke D.O.   On: 01/03/2021 08:20    Anti-infectives: Anti-infectives (From admission, onward)   Start     Dose/Rate Route Frequency Ordered Stop   01/02/21 2000  vancomycin (VANCOREADY) IVPB 1000 mg/200 mL        1,000 mg 200 mL/hr over 60 Minutes Intravenous Every 24 hours 01/02/21 1831     01/02/21 1500  metroNIDAZOLE (FLAGYL) IVPB 500 mg        500 mg 100 mL/hr over 60 Minutes Intravenous Every 8 hours 01/02/21 1409     01/01/21 1845  vancomycin (VANCOCIN) IVPB 1000 mg/200 mL premix  Status:  Discontinued        1,000 mg 200 mL/hr over 60 Minutes Intravenous Every 24 hours 01/01/21 1830 01/02/21 1830   01/01/21 1845  ceFEPIme (MAXIPIME) 2 g in sodium chloride 0.9 % 100 mL IVPB        2 g 200 mL/hr over 30 Minutes Intravenous Every 12 hours 01/01/21 1836        Assessment/Plan: s/p Procedure(s): Lower Extremity Angiography (Left) Assessment: Cellulitis with abscess left leg.   Plan: Discussed with the patient that the MRI of her ankle showed abscess extending to the upper limit of the exam.  Spoke with Dr. Posey Pronto in radiology who recommended a leg MRI with contrast for further evaluation.  Discussed with the patient that the extent of the infection goes higher than my scope of practice.  Also discussed with Dr. Jimmye Norman and recommended evaluation by general surgery or orthopedics for debridement.  At this point podiatry will sign off.  LOS: 2 days    Durward Fortes 01/03/2021

## 2021-01-04 ENCOUNTER — Encounter: Payer: Self-pay | Admitting: Internal Medicine

## 2021-01-04 ENCOUNTER — Inpatient Hospital Stay: Payer: Medicaid Other | Admitting: Anesthesiology

## 2021-01-04 ENCOUNTER — Encounter
Admission: EM | Disposition: A | Discharge status: Home / Self Care | Payer: Self-pay | Source: Home / Self Care | Attending: Internal Medicine

## 2021-01-04 DIAGNOSIS — D75839 Thrombocytosis, unspecified: Secondary | ICD-10-CM | POA: Diagnosis not present

## 2021-01-04 DIAGNOSIS — L03116 Cellulitis of left lower limb: Secondary | ICD-10-CM | POA: Diagnosis not present

## 2021-01-04 DIAGNOSIS — L02416 Cutaneous abscess of left lower limb: Secondary | ICD-10-CM | POA: Diagnosis not present

## 2021-01-04 DIAGNOSIS — B9689 Other specified bacterial agents as the cause of diseases classified elsewhere: Secondary | ICD-10-CM

## 2021-01-04 DIAGNOSIS — M71072 Abscess of bursa, left ankle and foot: Secondary | ICD-10-CM | POA: Diagnosis not present

## 2021-01-04 DIAGNOSIS — D72828 Other elevated white blood cell count: Secondary | ICD-10-CM | POA: Diagnosis not present

## 2021-01-04 HISTORY — PX: INCISION AND DRAINAGE ABSCESS: SHX5864

## 2021-01-04 LAB — BASIC METABOLIC PANEL
Anion gap: 8 (ref 5–15)
BUN: 10 mg/dL (ref 8–23)
CO2: 26 mmol/L (ref 22–32)
Calcium: 8.6 mg/dL — ABNORMAL LOW (ref 8.9–10.3)
Chloride: 104 mmol/L (ref 98–111)
Creatinine, Ser: 0.62 mg/dL (ref 0.44–1.00)
GFR, Estimated: >60 mL/min (ref >60)
Glucose, Bld: 160 mg/dL — ABNORMAL HIGH (ref 70–99)
Potassium: 4.1 mmol/L (ref 3.5–5.1)
Sodium: 138 mmol/L (ref 135–145)

## 2021-01-04 LAB — CBC
HCT: 31.5 % — ABNORMAL LOW (ref 36.0–46.0)
Hemoglobin: 10.6 g/dL — ABNORMAL LOW (ref 12.0–15.0)
MCH: 29.7 pg (ref 26.0–34.0)
MCHC: 33.7 g/dL (ref 30.0–36.0)
MCV: 88.2 fL (ref 80.0–100.0)
Platelets: 620 10*3/uL — ABNORMAL HIGH (ref 150–400)
RBC: 3.57 MIL/uL — ABNORMAL LOW (ref 3.87–5.11)
RDW: 14.7 % (ref 11.5–15.5)
WBC: 16.1 10*3/uL — ABNORMAL HIGH (ref 4.0–10.5)
nRBC: 0 % (ref 0.0–0.2)

## 2021-01-04 LAB — VANCOMYCIN, TROUGH: Vancomycin Tr: 7 ug/mL — ABNORMAL LOW (ref 15–20)

## 2021-01-04 SURGERY — INCISION AND DRAINAGE, ABSCESS
Anesthesia: General | Site: Leg Lower | Laterality: Left

## 2021-01-04 MED ORDER — PREGABALIN 50 MG PO CAPS
100.0000 mg | ORAL_CAPSULE | Freq: Three times a day (TID) | ORAL | Status: DC
  Administered 2021-01-04 – 2021-01-07 (×8): 100 mg via ORAL
  Filled 2021-01-04 (×8): qty 2

## 2021-01-04 MED ORDER — PROPOFOL 10 MG/ML IV BOLUS
INTRAVENOUS | Status: DC | PRN
  Administered 2021-01-04: 200 mg via INTRAVENOUS

## 2021-01-04 MED ORDER — PHENYLEPHRINE HCL (PRESSORS) 10 MG/ML IV SOLN
INTRAVENOUS | Status: AC
  Filled 2021-01-04: qty 1

## 2021-01-04 MED ORDER — ONDANSETRON HCL 4 MG/2ML IJ SOLN
INTRAMUSCULAR | Status: DC | PRN
  Administered 2021-01-04: 4 mg via INTRAVENOUS

## 2021-01-04 MED ORDER — FENTANYL CITRATE (PF) 250 MCG/5ML IJ SOLN
INTRAMUSCULAR | Status: AC
  Filled 2021-01-04: qty 5

## 2021-01-04 MED ORDER — MORPHINE SULFATE (PF) 4 MG/ML IV SOLN
INTRAVENOUS | Status: AC
  Filled 2021-01-04: qty 1

## 2021-01-04 MED ORDER — EPHEDRINE 5 MG/ML INJ
INTRAVENOUS | Status: AC
  Filled 2021-01-04: qty 10

## 2021-01-04 MED ORDER — ACETAMINOPHEN 10 MG/ML IV SOLN
INTRAVENOUS | Status: AC
  Filled 2021-01-04: qty 100

## 2021-01-04 MED ORDER — MEPERIDINE HCL 50 MG/ML IJ SOLN
INTRAMUSCULAR | Status: AC
  Administered 2021-01-04 (×2): 12.5 mg
  Filled 2021-01-04: qty 1

## 2021-01-04 MED ORDER — MIDAZOLAM HCL 2 MG/2ML IJ SOLN
INTRAMUSCULAR | Status: AC
  Filled 2021-01-04: qty 2

## 2021-01-04 MED ORDER — SODIUM CHLORIDE 0.9 % IV SOLN: INTRAVENOUS | Status: DC

## 2021-01-04 MED ORDER — VANCOMYCIN HCL 500 MG/100ML IV SOLN
500.0000 mg | Freq: Once | INTRAVENOUS | Status: AC
  Administered 2021-01-04: 500 mg via INTRAVENOUS
  Filled 2021-01-04: qty 100

## 2021-01-04 MED ORDER — ONDANSETRON HCL 4 MG/2ML IJ SOLN
INTRAMUSCULAR | Status: AC
  Filled 2021-01-04: qty 2

## 2021-01-04 MED ORDER — ONDANSETRON HCL 4 MG/2ML IJ SOLN: 4.0000 mg | Freq: Once | INTRAMUSCULAR | Status: DC | PRN

## 2021-01-04 MED ORDER — LIDOCAINE HCL (PF) 2 % IJ SOLN
INTRAMUSCULAR | Status: AC
  Filled 2021-01-04: qty 5

## 2021-01-04 MED ORDER — VANCOMYCIN HCL 1500 MG/300ML IV SOLN
1500.0000 mg | INTRAVENOUS | Status: DC
  Administered 2021-01-05 – 2021-01-06 (×2): 1500 mg via INTRAVENOUS
  Filled 2021-01-04 (×3): qty 300

## 2021-01-04 MED ORDER — MIDAZOLAM HCL 2 MG/2ML IJ SOLN
INTRAMUSCULAR | Status: DC | PRN
  Administered 2021-01-04: 2 mg via INTRAVENOUS

## 2021-01-04 MED ORDER — MORPHINE SULFATE (PF) 4 MG/ML IV SOLN
2.0000 mg | INTRAVENOUS | Status: DC | PRN
  Administered 2021-01-04 (×3): 2 mg via INTRAVENOUS

## 2021-01-04 MED ORDER — SODIUM CHLORIDE 0.9 % IV SOLN
INTRAVENOUS | Status: DC | PRN
  Administered 2021-01-04 (×4): 300 ug via INTRAVENOUS

## 2021-01-04 MED ORDER — LIDOCAINE HCL (CARDIAC) PF 100 MG/5ML IV SOSY
PREFILLED_SYRINGE | INTRAVENOUS | Status: DC | PRN
  Administered 2021-01-04: 100 mg via INTRAVENOUS

## 2021-01-04 MED ORDER — SUCCINYLCHOLINE CHLORIDE 20 MG/ML IJ SOLN
INTRAMUSCULAR | Status: DC | PRN
  Administered 2021-01-04: 100 mg via INTRAVENOUS

## 2021-01-04 MED ORDER — EPHEDRINE SULFATE 50 MG/ML IJ SOLN
INTRAMUSCULAR | Status: DC | PRN
  Administered 2021-01-04 (×2): 15 mg via INTRAVENOUS

## 2021-01-04 MED ORDER — DEXMEDETOMIDINE (PRECEDEX) IN NS 20 MCG/5ML (4 MCG/ML) IV SYRINGE
PREFILLED_SYRINGE | INTRAVENOUS | Status: DC | PRN
  Administered 2021-01-04: 12 ug via INTRAVENOUS
  Administered 2021-01-04 (×2): 8 ug via INTRAVENOUS
  Administered 2021-01-04: 12 ug via INTRAVENOUS

## 2021-01-04 MED ORDER — DEXAMETHASONE SODIUM PHOSPHATE 10 MG/ML IJ SOLN
INTRAMUSCULAR | Status: AC
  Filled 2021-01-04: qty 1

## 2021-01-04 MED ORDER — ACETAMINOPHEN 10 MG/ML IV SOLN
INTRAVENOUS | Status: DC | PRN
  Administered 2021-01-04: 1000 mg via INTRAVENOUS

## 2021-01-04 MED ORDER — MORPHINE SULFATE (PF) 4 MG/ML IV SOLN
INTRAVENOUS | Status: AC
  Administered 2021-01-04: 2 mg via INTRAVENOUS
  Filled 2021-01-04: qty 1

## 2021-01-04 MED ORDER — LACTATED RINGERS IV SOLN: INTRAVENOUS | Status: DC | PRN

## 2021-01-04 MED ORDER — SUCCINYLCHOLINE CHLORIDE 200 MG/10ML IV SOSY
PREFILLED_SYRINGE | INTRAVENOUS | Status: AC
  Filled 2021-01-04: qty 10

## 2021-01-04 MED ORDER — FENTANYL CITRATE (PF) 100 MCG/2ML IJ SOLN
INTRAMUSCULAR | Status: DC | PRN
  Administered 2021-01-04 (×2): 25 ug via INTRAVENOUS
  Administered 2021-01-04: 100 ug via INTRAVENOUS
  Administered 2021-01-04: 25 ug via INTRAVENOUS
  Administered 2021-01-04 (×2): 50 ug via INTRAVENOUS
  Administered 2021-01-04 (×3): 25 ug via INTRAVENOUS

## 2021-01-04 MED ORDER — PROPOFOL 10 MG/ML IV BOLUS
INTRAVENOUS | Status: AC
  Filled 2021-01-04: qty 20

## 2021-01-04 MED ORDER — FENTANYL CITRATE (PF) 100 MCG/2ML IJ SOLN
INTRAMUSCULAR | Status: AC
  Filled 2021-01-04: qty 2

## 2021-01-04 MED ORDER — DEXAMETHASONE SODIUM PHOSPHATE 10 MG/ML IJ SOLN
INTRAMUSCULAR | Status: DC | PRN
  Administered 2021-01-04: 4 mg via INTRAVENOUS

## 2021-01-04 SURGICAL SUPPLY — 27 items
BLADE CLIPPER SURG (BLADE) ×1 IMPLANT
BLADE SURG 15 STRL LF DISP TIS (BLADE) ×1 IMPLANT
BLADE SURG 15 STRL SS (BLADE) ×2
BRUSH SCRUB EZ  4% CHG (MISCELLANEOUS)
BRUSH SCRUB EZ 4% CHG (MISCELLANEOUS) ×1 IMPLANT
CANISTER WOUND CARE 500ML ATS (WOUND CARE) ×1 IMPLANT
COVER WAND RF STERILE (DRAPES) ×2 IMPLANT
DRAPE EXTREMITY 106X87X128.5 (DRAPES) ×1 IMPLANT
DRAPE LAPAROTOMY 77X122 PED (DRAPES) ×2 IMPLANT
ELECT CAUTERY BLADE 6.4 (BLADE) ×1 IMPLANT
ELECT REM PT RETURN 9FT ADLT (ELECTROSURGICAL) ×2
ELECTRODE REM PT RTRN 9FT ADLT (ELECTROSURGICAL) ×1 IMPLANT
GAUZE SPONGE 4X4 12PLY STRL (GAUZE/BANDAGES/DRESSINGS) ×1 IMPLANT
GLOVE SURG ENC MOIS LTX SZ7 (GLOVE) ×3 IMPLANT
GOWN STRL REUS W/ TWL LRG LVL3 (GOWN DISPOSABLE) ×2 IMPLANT
GOWN STRL REUS W/TWL LRG LVL3 (GOWN DISPOSABLE) ×4
KIT DRSG VAC SLVR GRANUFM (MISCELLANEOUS) ×1 IMPLANT
MANIFOLD NEPTUNE II (INSTRUMENTS) ×2 IMPLANT
NEEDLE HYPO 22GX1.5 SAFETY (NEEDLE) ×2 IMPLANT
NS IRRIG 1000ML POUR BTL (IV SOLUTION) ×2 IMPLANT
PACK BASIN MINOR ARMC (MISCELLANEOUS) ×2 IMPLANT
SOL PREP PVP 2OZ (MISCELLANEOUS) ×2
SOLUTION PREP PVP 2OZ (MISCELLANEOUS) ×1 IMPLANT
SPONGE LAP 18X18 RF (DISPOSABLE) ×4 IMPLANT
STOCKINETTE STRL 4IN 9604848 (GAUZE/BANDAGES/DRESSINGS) ×2 IMPLANT
SWAB CULTURE AMIES ANAERIB BLU (MISCELLANEOUS) ×2 IMPLANT
SYR BULB IRRIG 60ML STRL (SYRINGE) ×2 IMPLANT

## 2021-01-04 NOTE — Transfer of Care (Signed)
Immediate Anesthesia Transfer of Care Note  Patient: Jamie Schultz  Procedure(s) Performed: INCISION AND DRAINAGE ABSCESS (Left Leg Lower)  Patient Location: PACU  Anesthesia Type:General  Level of Consciousness: awake, alert  and oriented  Airway & Oxygen Therapy: Patient Spontanous Breathing and Patient connected to face mask oxygen  Post-op Assessment: Report given to RN and Post -op Vital signs reviewed and stable  Post vital signs: Reviewed and stable  Last Vitals:  Vitals Value Taken Time  BP 161/93 01/04/21 1539  Temp    Pulse 83 01/04/21 1539  Resp 24 01/04/21 1542  SpO2 98 % 01/04/21 1539  Vitals shown include unvalidated device data.  Last Pain:  Vitals:   01/04/21 1205  TempSrc:   PainSc: 5       Patients Stated Pain Goal: 5 (86/75/44 9201)  Complications: No complications documented.

## 2021-01-04 NOTE — Anesthesia Postprocedure Evaluation (Signed)
Anesthesia Post Note  Patient: Jamie Schultz  Procedure(s) Performed: INCISION AND DRAINAGE ABSCESS (Left Leg Lower)  Patient location during evaluation: PACU Anesthesia Type: General Level of consciousness: awake and alert and oriented Pain management: pain level controlled Vital Signs Assessment: post-procedure vital signs reviewed and stable Respiratory status: spontaneous breathing Cardiovascular status: blood pressure returned to baseline Anesthetic complications: no   No complications documented.   Last Vitals:  Vitals:   01/04/21 1703 01/04/21 1717  BP: (!) 141/87 (!) 146/79  Pulse: 92 93  Resp: 18 20  Temp: 37 C 37.1 C  SpO2: (!) 89% 93%    Last Pain:  Vitals:   01/04/21 1717  TempSrc: Oral  PainSc:                  [name]

## 2021-01-04 NOTE — Consult Note (Addendum)
Pharmacy Antibiotic Note  Jamie Schultz is a 60 y.o. female admitted on 12/29/2020 with cellulitis. Patient was admitted on 5/1 and left AMA earlier today.  Pharmacy has been consulted for vancomycin and cefepime dosing.  WBC elevated at 27.3, pt afebrile. Patient has documented penicillin allergy but has taken cephalosporins in the past without incident.  0506 @ 2159 Vanc Peak: 24 0507 @ 1919 Vanc Trough: 7 AUC: 336  Ke: 0.0578  Vd: 51.4  Plan: Will adjust dose to  vancomycin 1500mg  IV q24h Calculated AUC: 505 (goal AUC 400-550)   Continue cefepime 2g IV q12h  Monitor clinical picture, renal function, vanc levels tonight (6th dose)  Pt still in need of surgical debridement - wound cultures pending  Height: 5' (152.4 cm) Weight: 51.3 kg (113 lb) IBW/kg (Calculated) : 45.5  Temp (24hrs), Avg:98.3 F (36.8 C), Min:97.8 F (36.6 C), Max:98.7 F (37.1 C)  Recent Labs  Lab 12/29/20 1738 12/29/20 2147 12/30/20 0518 12/31/20 0544 01/01/21 1652 01/02/21 0407 01/03/21 0801 01/03/21 2159 01/04/21 0404 01/04/21 1919  WBC 21.5*  --    < > 15.6* 27.3* 19.0* 16.3*  --  16.1*  --   CREATININE 0.62  --    < > 0.64 0.65 0.76 0.53  --  0.62  --   LATICACIDVEN 1.1 1.0  --   --   --   --   --   --   --   --   VANCOTROUGH  --   --   --   --   --   --   --   --   --  7*  VANCOPEAK  --   --   --   --   --   --   --  24*  --   --    < > = values in this interval not displayed.    Estimated Creatinine Clearance: 53 mL/min (by C-G formula based on SCr of 0.62 mg/dL).    Allergies  Allergen Reactions  . Bee Venom Anaphylaxis and Other (See Comments)    Bees/wasps/yellow jackets  . Keflex [Cephalexin] Anaphylaxis  . Penicillins Anaphylaxis and Other (See Comments)    Has patient had a PCN reaction causing immediate rash, facial/tongue/throat swelling, SOB or lightheadedness with hypotension: Yes Has patient had a PCN reaction causing severe rash involving mucus membranes or skin  necrosis: No Has patient had a PCN reaction that required hospitalization Yes, in the hospital already Has patient had a PCN reaction occurring within the last 10 years: Yes If all of the above answers are "NO", then may proceed with Cephalosporin use.   . Sulfa Antibiotics Other (See Comments)    Renal failure  . Hydrocodone Rash and Other (See Comments)    "I couldn't get my breath"  . Ibuprofen Other (See Comments)    Vomiting blood  . Contrast Media [Iodinated Diagnostic Agents] Other (See Comments)    Patient states she had a previous reaction to iodinated contrast media agents. Premedicate.  . Nsaids Itching  . Codeine Itching and Rash  . Cyclobenzaprine Other (See Comments)    Severe constipation  . Fentanyl Nausea And Vomiting and Other (See Comments)    Severe vomiting  . Methadone Other (See Comments)    Change in mental status  . Neurontin [Gabapentin] Hives  . Tape Itching, Rash and Other (See Comments)    Blisters skin, Please use "paper" tape  . Tegretol [Carbamazepine] Hives and Rash  . Toradol [Ketorolac Tromethamine]  Hives and Rash  . Tramadol Hives and Rash    Antimicrobials this admission: Aztreonam 5/1 x1 Vanc 5/1> Ceftriaxone 5/1>5/3 Cefepime 5/3 >>  Metronidazole 5/5 >>   Microbiology results: 5/1 BCx: NGTD  Thank you for allowing pharmacy to be a part of this patient's care.  Pernell Dupre, PharmD, BCPS Clinical Pharmacist 01/04/2021 8:33 PM

## 2021-01-04 NOTE — OR Nursing (Signed)
1 piece of foam used in wound vac

## 2021-01-04 NOTE — Op Note (Addendum)
  01/04/2021  3:27 PM  PATIENT:  Jamie Schultz  60 y.o. female  PRE-OPERATIVE DIAGNOSIS:   Left leg abscess with questionable necrotizing soft tissue infection  POST-OPERATIVE DIAGNOSIS:  Same  PROCEDURE:   1. Incision and drainage of complex Left leg deep abscess 2. Excisional debridement of skin subcutaneous tissue and fascia  measuring 44 square centimeters ( 8x5.5cm) 3. Placement of wound vac left leg    SURGEON:  Surgeon(s) and Role:    * Mischa Brittingham F, MD - Primary   FINDINGS: complex abscess left leg and necrotizing infection involving skin, sub q tissue and superficial fascia. Muscle was intact  ANESTHESIA: GETA  EBL: 20cc  INDICATIONS FOR PROCEDURE  abscess  DICTATION:  Patient was explained about the  Procedure in detail. Risks, benefits,  possible complications and a consent was obtained. The patient was taken to the operating room and placed in the supine position.  incision was created lateral aspect of the leg, superior and posterior to the lateral malleoli pus was drained and cultured. There was complex luculations that we were able to lyse with a combination of finger fracture and suction device. All the loculations were broken down.  There was evidence of skin, subq tissue and fascia necrosis. Excisional debridement performed with cautery. Muscle was not necrotic.  Hemostasis was obtained with electrocautery. Irrigation with normal saline performed with 4 lts. I did not use pressure lavage due to the friability of the tissues. A silver sponge was selected and wound vac placed in the standard fashion. No evidence of leaks observed.    Needle and laparotomy counts were correct and there were no immediate complications  Jules Husbands, MD

## 2021-01-04 NOTE — Progress Notes (Signed)
PROGRESS NOTE    Jamie Schultz  WEX:[id number] DOB: May 22, 1961 DOA: 01/01/2021 PCP: Simona Huh, NP    Assessment & Plan:   Principal Problem:   Cellulitis and abscess of left leg Active Problems:   Major depressive disorder, recurrent episode, 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[place])   Hyperlipidemia   Hypothyroidism   Benign neoplasm of descending colon   Bilateral primary osteoarthritis of knee   Chronic pain syndrome   Bilateral chronic knee pain (B (L>R)  LLE abscess & cellulitis: now w/ abscess as per MRI. Continue on IV flagyl, cefepime & vanco as per ID. Angiogram will be 01/06/21 as per vascular surg. Will go for I&D today as per general surg   Leukocytosis: continue on IV abxs. Secondary to above infection   Normocytic anemia: H&H are stable. Will transfuse if Hb <7.0  Thrombocytosis: etiology unclear, likely secondary to infection  COPD: w/o exacerbation. Continue on bronchodilators and encourage incentive spirometry  Non-compliance: pt left AMA and came back to the hospital a couple hours later on 01/01/21. Discussed the importance of completing treatment   Chronic pain: continue on oxycodone   Depression: severity unknown. Continue on home dose of duloxetine, seroquel, zyprexa   Anxiety: severity unknown. Xanax prn   Peripheral neuropathy: continue on home dose of pregabalin   Insomnia: continue on melatonin   HLD: continue on statin   Hypothyroidism: continue on home dose of levothyroxine   GERD: continue on PPI   DVT prophylaxis: lovenox  Code Status: full  Family Communication:  Disposition Plan: likely d/c back home   Level of care: Med-Surg   Status is: Inpatient  Remains inpatient appropriate because:Ongoing diagnostic testing needed not appropriate for outpatient work up, IV treatments appropriate due to intensity of illness or inability to take PO and Inpatient level of care appropriate due to severity of illness   Dispo: The  patient is from: Home              Anticipated d/c is to: Home              Patient currently is not medically stable to d/c.   Difficult to place patient Yes    Consultants:   ID   Procedures:    Antimicrobials: vanco, cefepime, flagyl     Subjective: Pt still c/o left foot & leg pain.   Objective: Vitals:   01/03/21 1641 01/03/21 2029 01/03/21 2321 01/04/21 0613  BP: (!) 175/111 121/75 115/76 98/67  Pulse: 88 79 84 79  Resp: 18 18 19 19   Temp: 98.5 F (36.9 C) 98.4 F (36.9 C) 98.1 F (36.7 C) 98.2 F (36.8 C)  TempSrc: Oral Oral Oral Oral  SpO2: 93% 93% 91% 95%  Weight:      Height:        Intake/Output Summary (Last 24 hours) at 01/04/2021 0758 Last data filed at 01/04/2021 9381 Gross per 24 hour  Intake 730.64 ml  Output --  Net 730.64 ml   Filed Weights   01/01/21 1634  Weight: 51.3 kg    Examination:  General exam: Appears comfortable.  Respiratory system: diminished breath sounds b/l. No rales  Cardiovascular system:  S1/S2+. No rubs or clicks  Gastrointestinal system: Abd is soft, NT, ND & hypoactive bowel sounds  Central nervous system: Alert and oriented. Moves all extremities  Extremities: LLE is dressed and dressing is C/D/I  Psychiatry: Judgement and insight appear normal. Appropriate mood and affect     Data Reviewed: I  have personally reviewed following labs and imaging studies  CBC: Recent Labs  Lab 12/29/20 1738 12/30/20 0518 12/31/20 0544 01/01/21 1652 01/02/21 0407 01/03/21 0801 01/04/21 0404  WBC 21.5*   < > 15.6* 27.3* 19.0* 16.3* 16.1*  NEUTROABS 17.6*  --   --  19.5*  --   --   --   HGB 11.1*   < > 10.7* 10.9* 8.8* 9.3* 10.6*  HCT 33.3*   < > 32.5* 32.8* 26.4* 27.9* 31.5*  MCV 90.0   < > 91.8 89.6 89.8 89.4 88.2  PLT 363   < > 397 532* 428* 527* 620*   < > = values in this interval not displayed.   Basic Metabolic Panel: Recent Labs  Lab 12/30/20 0518 12/31/20 0544 01/01/21 1652 01/02/21 0407 01/03/21 0801  01/04/21 0404  NA 136  --  132* 138 136 138  K 4.4  --  4.0 3.9 4.1 4.1  CL 105  --  98 102 105 104  CO2 24  --  22 29 25 26   GLUCOSE 151*  --  110* 127* 91 160*  BUN 11  --  9 15 9 10   CREATININE 0.61 0.64 0.65 0.76 0.53 0.62  CALCIUM 8.5*  --  8.6* 8.6* 8.3* 8.6*   GFR: Estimated Creatinine Clearance: 53 mL/min (by C-G formula based on SCr of 0.62 mg/dL). Liver Function Tests: Recent Labs  Lab 12/29/20 1738 01/01/21 1652  AST 30 25  ALT 42 36  ALKPHOS 167* 199*  BILITOT 0.5 0.5  PROT 5.9* <3.0*  ALBUMIN 2.4* <1.0*   No results for input(s): LIPASE, AMYLASE in the last 168 hours. No results for input(s): AMMONIA in the last 168 hours. Coagulation Profile: Recent Labs  Lab 12/29/20 1738  INR 1.0   Cardiac Enzymes: Recent Labs  Lab 12/30/20 0518 01/02/21 0407  CKTOTAL 155 82   BNP (last 3 results) No results for input(s): PROBNP in the last 8760 hours. HbA1C: No results for input(s): HGBA1C in the last 72 hours. CBG: No results for input(s): GLUCAP in the last 168 hours. Lipid Profile: No results for input(s): CHOL, HDL, LDLCALC, TRIG, CHOLHDL, LDLDIRECT in the last 72 hours. Thyroid Function Tests: No results for input(s): TSH, T4TOTAL, FREET4, T3FREE, THYROIDAB in the last 72 hours. Anemia Panel: No results for input(s): VITAMINB12, FOLATE, FERRITIN, TIBC, IRON, RETICCTPCT in the last 72 hours. Sepsis Labs: Recent Labs  Lab 12/29/20 1738 12/29/20 2147 12/30/20 0518  PROCALCITON  --   --  0.35  LATICACIDVEN 1.1 1.0  --     Recent Results (from the past 240 hour(s))  Culture, blood (Routine x 2)     Status: None   Collection Time: 12/29/20  5:38 PM   Specimen: BLOOD  Result Value Ref Range Status   Specimen Description BLOOD BLOOD LEFT FOREARM  Final   Special Requests   Final    BOTTLES DRAWN AEROBIC AND ANAEROBIC Blood Culture results may not be optimal due to an inadequate volume of blood received in culture bottles   Culture   Final    NO  GROWTH 5 DAYS Performed at [facility], [address], [place], [place] [address]    Report Status 01/03/2021 FINAL  Final  Culture, blood (Routine x 2)     Status: None   Collection Time: 12/29/20  5:42 PM   Specimen: BLOOD  Result Value Ref Range Status   Specimen Description BLOOD BLOOD RIGHT FOREARM  Final   Special Requests  Final    BOTTLES DRAWN AEROBIC AND ANAEROBIC Blood Culture results may not be optimal due to an inadequate volume of blood received in culture bottles   Culture   Final    NO GROWTH 5 DAYS Performed at [facility], [address] [place], [place] [address]    Report Status 01/03/2021 FINAL  Final  SARS CORONAVIRUS 2 (TAT 6-24 HRS) Nasopharyngeal Nasopharyngeal Swab     Status: None   Collection Time: 12/29/20  8:46 PM   Specimen: Nasopharyngeal Swab  Result Value Ref Range Status   SARS Coronavirus 2 NEGATIVE NEGATIVE Final    Comment: (NOTE) SARS-CoV-2 target nucleic acids are NOT DETECTED.  The SARS-CoV-2 RNA is generally detectable in upper and lower respiratory specimens during the acute phase of infection. Negative results do not preclude SARS-CoV-2 infection, do not rule out co-infections with other pathogens, and should not be used as the sole basis for treatment or other patient management decisions. Negative results must be combined with clinical observations, patient history, and epidemiological information. The expected result is Negative.  Fact Sheet for Patients: SugarRoll.be  Fact Sheet for Healthcare Providers: https://www.woods-mathews.com/  This test is not yet approved or cleared by the [place] FDA and  has been authorized for detection and/or diagnosis of SARS-CoV-2 by FDA under an Emergency Use Authorization (EUA). This EUA will remain  in effect (meaning this test can be used) for the duration of the COVID-19 declaration under Se ction 564(b)(1) of the Act,  21 U.S.C. section 360bbb-3(b)(1), unless the authorization is terminated or revoked sooner.  Performed at [facility], [place] [address] [place], [place] [address]   MRSA PCR Screening     Status: Abnormal   Collection Time: 12/29/20 11:45 PM   Specimen: Nasopharyngeal  Result Value Ref Range Status   MRSA by PCR POSITIVE (A) NEGATIVE Final    Comment:        The GeneXpert MRSA Assay (FDA approved for NASAL specimens only), is one component of a comprehensive MRSA colonization surveillance program. It is not intended to diagnose MRSA infection nor to guide or monitor treatment for MRSA infections. RESULT CALLED TO, READ BACK BY AND VERIFIED WITH: NAKAY PATRICK AT 0148 12/30/20 MF Performed at [facility], [address] [place], [place] [address]   Resp Panel by RT-PCR (Flu A&B, Covid) Nasopharyngeal Swab     Status: None   Collection Time: 01/01/21  9:14 PM   Specimen: Nasopharyngeal Swab; Nasopharyngeal(NP) swabs in vial transport medium  Result Value Ref Range Status   SARS Coronavirus 2 by RT PCR NEGATIVE NEGATIVE Final    Comment: (NOTE) SARS-CoV-2 target nucleic acids are NOT DETECTED.  The SARS-CoV-2 RNA is generally detectable in upper respiratory specimens during the acute phase of infection. The lowest concentration of SARS-CoV-2 viral copies this assay can detect is 138 copies/mL. A negative result does not preclude SARS-Cov-2 infection and should not be used as the sole basis for treatment or other patient management decisions. A negative result may occur with  improper specimen collection/handling, submission of specimen other than nasopharyngeal swab, presence of viral mutation(s) within the areas targeted by this assay, and inadequate number of viral copies(<138 copies/mL). A negative result must be combined with clinical observations, patient history, and epidemiological information. The expected result is Negative.  Fact Sheet for  Patients:  EntrepreneurPulse.com.au  Fact Sheet for Healthcare Providers:  IncredibleEmployment.be  This test is no t yet approved or cleared by the [place]  FDA and  has been authorized for detection and/or diagnosis of SARS-CoV-2 by FDA under an Emergency Use Authorization (EUA). This EUA will remain  in effect (meaning this test can be used) for the duration of the COVID-19 declaration under Section 564(b)(1) of the Act, 21 U.S.C.section 360bbb-3(b)(1), unless the authorization is terminated  or revoked sooner.       Influenza A by PCR NEGATIVE NEGATIVE Final   Influenza B by PCR NEGATIVE NEGATIVE Final    Comment: (NOTE) The Xpert Xpress SARS-CoV-2/FLU/RSV plus assay is intended as an aid in the diagnosis of influenza from Nasopharyngeal swab specimens and should not be used as a sole basis for treatment. Nasal washings and aspirates are unacceptable for Xpert Xpress SARS-CoV-2/FLU/RSV testing.  Fact Sheet for Patients: EntrepreneurPulse.com.au  Fact Sheet for Healthcare Providers: IncredibleEmployment.be  This test is not yet approved or cleared by the [place] FDA and has been authorized for detection and/or diagnosis of SARS-CoV-2 by FDA under an Emergency Use Authorization (EUA). This EUA will remain in effect (meaning this test can be used) for the duration of the COVID-19 declaration under Section 564(b)(1) of the Act, 21 U.S.C. section 360bbb-3(b)(1), unless the authorization is terminated or revoked.  Performed at [facility], [place], [place] [address]   Aerobic Culture w Gram Stain (superficial specimen)     Status: None (Preliminary result)   Collection Time: 01/02/21  2:15 PM   Specimen: Wound  Result Value Ref Range Status   Specimen Description   Final    WOUND Performed at [facility], [address], [place], [place] [address]     Special Requests   Final    LL Performed at [facility], [address] [place], [place] [address]    Gram Stain   Final    FEW WBC PRESENT, PREDOMINANTLY PMN NO ORGANISMS SEEN    Culture   Final    CULTURE REINCUBATED FOR BETTER GROWTH Performed at [facility], [place] [address] [place],  [address]    Report Status PENDING  Incomplete         Radiology Studies: MR TIBIA FIBULA LEFT W WO CONTRAST  Result Date: 01/03/2021 CLINICAL DATA:  Left lower extremity cellulitis EXAM: MRI OF LOWER LEFT EXTREMITY WITHOUT AND WITH CONTRAST TECHNIQUE: Multiplanar, multisequence MR imaging of the left tibia and fibula was performed both before and after administration of intravenous contrast. CONTRAST:  59mL GADAVIST GADOBUTROL 1 MMOL/ML IV SOLN COMPARISON:  Left ankle MRI 01/02/2021. 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 No evidence of acute fracture. No dislocation. No cortical destruction or periosteal elevation. No evidence of osteomyelitis. Please refer to MRI report 01/02/2021 for detailed evaluation of the ankle. Ligaments No acute ligamentous injury. Muscles and Tendons Mild lower leg intramuscular edema most pronounced within the medial head of the gastrocnemius muscle. No intramuscular fluid collection. No new tendinous abnormality. Soft tissues Lobulated thick walled, peripherally enhancing abscess overlying the lateral aspect of the lower leg measuring approximately 13.0 x 1.3 x 6.4 cm (series 29, image 34; series 35, image 21). Collection closely approximates the overlying skin surface at the level of the distal fibular diaphysis (series 29, image 30). Marked surrounding soft tissue edema and enhancement compatible with cellulitis. Circumferential edema and fluid are seen throughout the left lower extremity which extends into the distal thigh to a lesser extent (series 11, image 9). There is  mild deep fascial edema and  enhancement. No deep fascial fluid collections. IMPRESSION: 1. Elongated complex peripherally enhancing abscess overlying the lateral aspect of the lower leg measuring approximately 13.0 x 1.3 x 6.4 cm. Marked surrounding soft tissue edema and enhancement compatible with cellulitis. Mild deep fascial edema and enhancement suggesting fasciitis. 2. No evidence of acute osteomyelitis. Electronically Signed   By: Davina Poke D.O.   On: 01/03/2021 15:00   MR ANKLE LEFT WO CONTRAST  Result Date: 01/03/2021 CLINICAL DATA:  Left ankle cellulitis EXAM: MRI OF THE LEFT ANKLE WITHOUT CONTRAST TECHNIQUE: Multiplanar, multisequence MR imaging of the ankle was performed. No intravenous contrast was administered. COMPARISON:  X-ray 01/01/2021, CT 12/30/2020 FINDINGS: TENDONS Peroneal: Tendinosis with partial interstitial tear of the retro-malleolar and proximal infra-malleolar portions of the peroneus longus tendon (series 4, images 16-21). Peroneus brevis tendon appears intact without evidence of tear. Trace tenosynovial fluid. Posteromedial: Intact tibialis posterior, flexor hallucis longus and flexor digitorum longus tendons. Anterior: Intact tibialis anterior, extensor hallucis longus and extensor digitorum longus tendons. Achilles: Intact. Plantar Fascia: Intact. LIGAMENTS Lateral: Anterior talofibular ligament is attenuated, likely reflecting sequela of remote trauma. Posterior talofibular and calcaneofibular ligaments appear grossly intact. Intact anterior and posterior tibiofibular ligaments. Medial: Deltoid ligament and spring ligament complex intact. CARTILAGE Ankle Joint: No joint effusion or chondral defect. Subtalar Joints/Sinus Tarsi: No joint effusion or chondral defect. Preservation of the anatomic fat within the sinus tarsi. Bones: No acute fracture. No dislocation. No cortical destruction or periostitis. No bone marrow edema. No marrow replacing bone lesion. Other: Marked  circumferential subcutaneous edema and fluid throughout the visualized lower leg, ankle, and foot. Developing, partially defined lobulated fluid collection within the subcutaneous soft tissues overlying the distal fibula in total measuring approximately 7.5 x 1.0 x 6.5 cm (series 7, image 25; series 5, image 5). Deep fascial edema, not out of proportion to the degree of subcutaneous edema. No deep fascial fluid collections are seen. No intramuscular fluid collections. IMPRESSION: 1. Marked circumferential subcutaneous edema and fluid throughout the visualized lower leg, ankle, and foot compatible with cellulitis. Partially defined lobulated fluid collection within the subcutaneous soft tissues laterally overlying the distal fibula in total measuring 7.5 x 1.0 x 6.5 cm compatible with phlegmon/developing abscess. 2. No evidence of osteomyelitis. 3. 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 Electronically Signed   By: Davina Poke D.O.   On: 01/03/2021 08:20        Scheduled Meds: . amphetamine-dextroamphetamine  20 mg Oral BID  . docusate sodium  200 mg Oral BID  . DULoxetine  60 mg Oral QPC lunch  . enoxaparin (LOVENOX) injection  40 mg Subcutaneous Q24H  . levothyroxine  50 mcg Oral Daily  . metoCLOPramide  5 mg Oral TID AC  . mometasone-formoterol  2 puff Inhalation BID  . pantoprazole  40 mg Oral Daily  . pravastatin  20 mg Oral Daily  . pregabalin  100 mg Oral TID  . QUEtiapine  800 mg Oral QHS  . rOPINIRole  2 mg Oral QHS  . ziprasidone  60 mg Oral QPM   Continuous Infusions: . sodium chloride Stopped (01/04/21 0616)  . ceFEPime (MAXIPIME) IV 200 mL/hr at 01/04/21 6720  . metronidazole 500 mg (01/04/21 0743)  . vancomycin Stopped (01/03/21 2110)     LOS: 3 days    Time spent: 32 mins    Wyvonnia Dusky, MD [facility] Pager [phone]  If 7PM-7AM, please contact night-coverage 01/04/2021, 7:58 AM

## 2021-01-04 NOTE — Progress Notes (Signed)
Day of Surgery   Subjective/Chief Complaint: Patient with left distal lateral leg skin infection post tattoo procedure.  She is complaining of pain in the leg and is scheduled for surgery today.    Objective: Vital signs in last 24 hours: Temp:  [98.1 F (36.7 C)-99.5 F (37.5 C)] 98.2 F (36.8 C) (05/07 1049) Pulse Rate:  [79-91] 87 (05/07 1049) Resp:  [15-19] 18 (05/07 1049) BP: (98-175)/(67-111) 159/86 (05/07 1049) SpO2:  [91 %-96 %] 96 % (05/07 1049) Last BM Date: 01/01/21  Intake/Output from previous day: 05/06 0701 - 05/07 0700 In: 730.6 [I.V.:15.9; IV Piggyback:714.7] Out: -  Intake/Output this shift: No intake/output data recorded.  General appearance: alert, cooperative, appears stated age and no distress Extremities: Left leg with swelling and dessing around distal leg/ankle.  She has some erythema noted above and below the dressing.  Incision/Wound: dressing intact, no drainge   Lab Results:  Recent Labs    01/03/21 0801 01/04/21 0404  WBC 16.3* 16.1*  HGB 9.3* 10.6*  HCT 27.9* 31.5*  PLT 527* 620*   BMET Recent Labs    01/03/21 0801 01/04/21 0404  NA 136 138  K 4.1 4.1  CL 105 104  CO2 25 26  GLUCOSE 91 160*  BUN 9 10  CREATININE 0.53 0.62  CALCIUM 8.3* 8.6*   PT/INR No results for input(s): LABPROT, INR in the last 72 hours. ABG No results for input(s): PHART, HCO3 in the last 72 hours.  Invalid input(s): PCO2, PO2  Studies/Results: CT TIBIA FIBULA LEFT W CONTRAST  Result Date: 01/04/2021 CLINICAL DATA:  Left lower leg soft tissue infection suspected. EXAM: CT OF THE LOWER LEFT EXTREMITY WITH CONTRAST TECHNIQUE: Multidetector CT imaging of the left lower leg was performed according to the standard protocol following intravenous contrast administration. CONTRAST:  114mL OMNIPAQUE IOHEXOL 300 MG/ML  SOLN COMPARISON:  MRI 01/03/2021 and 01/02/2021. Ankle radiographs 01/01/2021 and CT 12/30/2020. FINDINGS: Bones/Joint/Cartilage Images extend  from the distal left femur through the left ankle. Patient is status post left total knee arthroplasty. No evidence of hardware loosening, acute fracture or dislocation. There is no bone destruction. No significant knee or ankle joint effusion or abnormal enhancement. Ligaments Suboptimally assessed by CT. Muscles and Tendons No intramuscular fluid collection or abnormal enhancement identified. The visualized ankle tendons appear intact. The extensor mechanism at the knee appears intact, although is partly obscured by artifact from the total knee arthroplasty. Soft tissues Again demonstrated is diffuse edema and ill-defined fluid throughout the subcutaneous fat of the left lower leg. Overall, this has mildly worsened compared with the prior CT of 4 days prior. There is ill-defined peripherally enhancing collection within the subcutaneous fat of the distal lower leg anterolaterally which measures approximately 6.1 x 1.1 cm transverse on image 244/7. This is unchanged from the MRI performed earlier the same date. No associated foreign body or soft tissue emphysema. No other focal fluid collections are identified. IMPRESSION: 1. Again demonstrated is marked circumferential subcutaneous edema and ill-defined fluid throughout the left lower leg consistent with soft tissue infection (cellulitis). This has mildly worsened from previous CT of 4 days prior. 2. An ill-defined peripherally enhancing fluid collection anterolaterally in the distal left lower leg is better seen on MRI of the same date, and remains suspicious for an abscess. 3. No evidence of osteomyelitis or septic joint. Electronically Signed   By: Richardean Sale M.D.   On: 01/04/2021 09:15   MR TIBIA FIBULA LEFT W WO CONTRAST  Result Date: 01/03/2021  CLINICAL DATA:  Left lower extremity cellulitis EXAM: MRI OF LOWER LEFT EXTREMITY WITHOUT AND WITH CONTRAST TECHNIQUE: Multiplanar, multisequence MR imaging of the left tibia and fibula was performed both  before and after administration of intravenous contrast. CONTRAST:  57mL GADAVIST GADOBUTROL 1 MMOL/ML IV SOLN COMPARISON:  Left ankle MRI 01/02/2021. 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 No evidence of acute fracture. No dislocation. No cortical destruction or periosteal elevation. No evidence of osteomyelitis. Please refer to MRI report 01/02/2021 for detailed evaluation of the ankle. Ligaments No acute ligamentous injury. Muscles and Tendons Mild lower leg intramuscular edema most pronounced within the medial head of the gastrocnemius muscle. No intramuscular fluid collection. No new tendinous abnormality. Soft tissues Lobulated thick walled, peripherally enhancing abscess overlying the lateral aspect of the lower leg measuring approximately 13.0 x 1.3 x 6.4 cm (series 29, image 34; series 35, image 21). Collection closely approximates the overlying skin surface at the level of the distal fibular diaphysis (series 29, image 30). Marked surrounding soft tissue edema and enhancement compatible with cellulitis. Circumferential edema and fluid are seen throughout the left lower extremity which extends into the distal thigh to a lesser extent (series 11, image 9). There is mild deep fascial edema and enhancement. No deep fascial fluid collections. IMPRESSION: 1. Elongated complex peripherally enhancing abscess overlying the lateral aspect of the lower leg measuring approximately 13.0 x 1.3 x 6.4 cm. Marked surrounding soft tissue edema and enhancement compatible with cellulitis. Mild deep fascial edema and enhancement suggesting fasciitis. 2. No evidence of acute osteomyelitis. Electronically Signed   By: Davina Poke D.O.   On: 01/03/2021 15:00   MR ANKLE LEFT WO CONTRAST  Result Date: 01/03/2021 CLINICAL DATA:  Left ankle cellulitis EXAM: MRI OF THE LEFT ANKLE WITHOUT CONTRAST TECHNIQUE: Multiplanar, multisequence MR imaging of  the ankle was performed. No intravenous contrast was administered. COMPARISON:  X-ray 01/01/2021, CT 12/30/2020 FINDINGS: TENDONS Peroneal: Tendinosis with partial interstitial tear of the retro-malleolar and proximal infra-malleolar portions of the peroneus longus tendon (series 4, images 16-21). Peroneus brevis tendon appears intact without evidence of tear. Trace tenosynovial fluid. Posteromedial: Intact tibialis posterior, flexor hallucis longus and flexor digitorum longus tendons. Anterior: Intact tibialis anterior, extensor hallucis longus and extensor digitorum longus tendons. Achilles: Intact. Plantar Fascia: Intact. LIGAMENTS Lateral: Anterior talofibular ligament is attenuated, likely reflecting sequela of remote trauma. Posterior talofibular and calcaneofibular ligaments appear grossly intact. Intact anterior and posterior tibiofibular ligaments. Medial: Deltoid ligament and spring ligament complex intact. CARTILAGE Ankle Joint: No joint effusion or chondral defect. Subtalar Joints/Sinus Tarsi: No joint effusion or chondral defect. Preservation of the anatomic fat within the sinus tarsi. Bones: No acute fracture. No dislocation. No cortical destruction or periostitis. No bone marrow edema. No marrow replacing bone lesion. Other: Marked circumferential subcutaneous edema and fluid throughout the visualized lower leg, ankle, and foot. Developing, partially defined lobulated fluid collection within the subcutaneous soft tissues overlying the distal fibula in total measuring approximately 7.5 x 1.0 x 6.5 cm (series 7, image 25; series 5, image 5). Deep fascial edema, not out of proportion to the degree of subcutaneous edema. No deep fascial fluid collections are seen. No intramuscular fluid collections. IMPRESSION: 1. Marked circumferential subcutaneous edema and fluid throughout the visualized lower leg, ankle, and foot compatible with cellulitis. Partially defined lobulated fluid collection within the  subcutaneous soft tissues laterally overlying the distal fibula in total measuring 7.5 x 1.0 x 6.5 cm compatible with phlegmon/developing  abscess. 2. No evidence of osteomyelitis. 3. 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 Electronically Signed   By: Davina Poke D.O.   On: 01/03/2021 08:20    Anti-infectives: Anti-infectives (From admission, onward)   Start     Dose/Rate Route Frequency Ordered Stop   01/02/21 2000  vancomycin (VANCOREADY) IVPB 1000 mg/200 mL        1,000 mg 200 mL/hr over 60 Minutes Intravenous Every 24 hours 01/02/21 1831     01/02/21 1500  metroNIDAZOLE (FLAGYL) IVPB 500 mg        500 mg 100 mL/hr over 60 Minutes Intravenous Every 8 hours 01/02/21 1409     01/01/21 1845  vancomycin (VANCOCIN) IVPB 1000 mg/200 mL premix  Status:  Discontinued        1,000 mg 200 mL/hr over 60 Minutes Intravenous Every 24 hours 01/01/21 1830 01/02/21 1830   01/01/21 1845  ceFEPIme (MAXIPIME) 2 g in sodium chloride 0.9 % 100 mL IVPB        2 g 200 mL/hr over 30 Minutes Intravenous Every 12 hours 01/01/21 1836        Assessment/Plan: s/p Procedure(s): INCISION AND DRAINAGE ABSCESS (Left) Will continue to be on standby  LOS: 3 days    Elmore Guise 01/04/2021

## 2021-01-04 NOTE — Progress Notes (Signed)
Preoperative Review   Patient is met preoperatively. The history is reviewed in the chart and with the patient. I personally reviewed the options and rationale as well as the risks of this procedure that have been previously discussed with the patient. All questions asked by the patient and/or family were answered to their satisfaction.  Patient agrees to proceed with this procedure at this time.  Caroleen Hamman M.D. FACS

## 2021-01-04 NOTE — Anesthesia Procedure Notes (Signed)
Procedure Name: Intubation Date/Time: 01/04/2021 2:14 PM Performed by: Clinton Sawyer, CRNA Pre-anesthesia Checklist: Patient identified, Emergency Drugs available, Suction available and Patient being monitored Patient Re-evaluated:Patient Re-evaluated prior to induction Oxygen Delivery Method: Circle system utilized Preoxygenation: Pre-oxygenation with 100% oxygen Induction Type: Rapid sequence and Cricoid Pressure applied Laryngoscope Size: Mac and 4 Grade View: Grade I Tube type: Oral Tube size: 6.5 mm Number of attempts: 1 Airway Equipment and Method: Stylet Placement Confirmation: ETT inserted through vocal cords under direct vision,  positive ETCO2 and breath sounds checked- equal and bilateral Secured at: 22 cm Tube secured with: Tape Dental Injury: Teeth and Oropharynx as per pre-operative assessment

## 2021-01-05 ENCOUNTER — Inpatient Hospital Stay: Payer: Medicaid Other

## 2021-01-05 ENCOUNTER — Encounter: Payer: Self-pay | Admitting: Surgery

## 2021-01-05 DIAGNOSIS — L02416 Cutaneous abscess of left lower limb: Secondary | ICD-10-CM | POA: Diagnosis not present

## 2021-01-05 DIAGNOSIS — D649 Anemia, unspecified: Secondary | ICD-10-CM | POA: Diagnosis not present

## 2021-01-05 DIAGNOSIS — L03116 Cellulitis of left lower limb: Secondary | ICD-10-CM | POA: Diagnosis not present

## 2021-01-05 DIAGNOSIS — D75839 Thrombocytosis, unspecified: Secondary | ICD-10-CM | POA: Diagnosis not present

## 2021-01-05 LAB — AEROBIC CULTURE W GRAM STAIN (SUPERFICIAL SPECIMEN)

## 2021-01-05 LAB — CBC
HCT: 24.8 % — ABNORMAL LOW (ref 36.0–46.0)
Hemoglobin: 8.1 g/dL — ABNORMAL LOW (ref 12.0–15.0)
MCH: 29.9 pg (ref 26.0–34.0)
MCHC: 32.7 g/dL (ref 30.0–36.0)
MCV: 91.5 fL (ref 80.0–100.0)
Platelets: 529 10*3/uL — ABNORMAL HIGH (ref 150–400)
RBC: 2.71 MIL/uL — ABNORMAL LOW (ref 3.87–5.11)
RDW: 15 % (ref 11.5–15.5)
WBC: 15.7 10*3/uL — ABNORMAL HIGH (ref 4.0–10.5)
nRBC: 0 % (ref 0.0–0.2)

## 2021-01-05 LAB — BASIC METABOLIC PANEL
Anion gap: 6 (ref 5–15)
BUN: 20 mg/dL (ref 8–23)
CO2: 26 mmol/L (ref 22–32)
Calcium: 8.1 mg/dL — ABNORMAL LOW (ref 8.9–10.3)
Chloride: 106 mmol/L (ref 98–111)
Creatinine, Ser: 0.8 mg/dL (ref 0.44–1.00)
GFR, Estimated: >60 mL/min (ref >60)
Glucose, Bld: 150 mg/dL — ABNORMAL HIGH (ref 70–99)
Potassium: 4.1 mmol/L (ref 3.5–5.1)
Sodium: 138 mmol/L (ref 135–145)

## 2021-01-05 MED ORDER — MORPHINE SULFATE (PF) 2 MG/ML IV SOLN
2.0000 mg | INTRAVENOUS | Status: DC | PRN
  Administered 2021-01-05 (×2): 2 mg via INTRAVENOUS
  Filled 2021-01-05 (×2): qty 1

## 2021-01-05 MED ORDER — MORPHINE SULFATE (PF) 2 MG/ML IV SOLN
2.0000 mg | INTRAVENOUS | Status: DC | PRN
  Administered 2021-01-05 – 2021-01-07 (×9): 2 mg via INTRAVENOUS
  Filled 2021-01-05 (×10): qty 1

## 2021-01-05 NOTE — Progress Notes (Signed)
PROGRESS NOTE    Jamie Schultz  JXB:[id number] DOB: 1960/12/22 DOA: 01/01/2021 PCP: Simona Huh, NP    Assessment & Plan:   Principal Problem:   Cellulitis and abscess of left leg Active Problems:   Major depressive disorder, recurrent episode, 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[place])   Hyperlipidemia   Hypothyroidism   Benign neoplasm of descending colon   Bilateral primary osteoarthritis of knee   Chronic pain syndrome   Bilateral chronic knee pain (B (L>R)  LLE abscess & cellulitis: now w/ abscess as per MRI. Continue on IV flagyl, cefepime & vanco as per ID. Angiogram will be 01/06/21 as per vascular surg. S/p I&D, excisional debridement of subcue tissue & fascia & placement of wound vac on 01/04/21 as per general surg. Oxycodone, morphine prn for pain   Leukocytosis: secondary to above infection. Continue on IV abxs   Normocytic anemia: H&H are labile. No need for a transfusion at this time   Thrombocytosis: etiology unclear, likely secondary to infection   COPD: w/o exacerbation. Continue on bronchodilators and encourage incentive spirometry  Non-compliance: pt left AMA and came back to the hospital a couple hours later on 01/01/21. Discussed the importance of completing treatment   Chronic pain: continue on oxycodone   Depression: severity unknown. Continue on home dose of duloxetine, seroquel, zyprexa   Anxiety: severity unknown. Xanax prn   Peripheral neuropathy: continue on home dose of pregabalin   Insomnia: continue on melatonin   HLD: continue on statin   Hypothyroidism: continue on home dose of levothyroxine    GERD: continue on pantoprazole   DVT prophylaxis: lovenox  Code Status: full  Family Communication:  Disposition Plan: likely d/c back home   Level of care: Med-Surg   Status is: Inpatient  Remains inpatient appropriate because:Ongoing diagnostic testing needed not appropriate for outpatient work up, IV treatments appropriate due to  intensity of illness or inability to take PO and Inpatient level of care appropriate due to severity of illness   Dispo: The patient is from: Home              Anticipated d/c is to: Home              Patient currently is not medically stable to d/c.   Difficult to place patient Yes    Consultants:   ID   Procedures:    Antimicrobials: vanco, cefepime, flagyl     Subjective: Pt c/o LLE pain   Objective: Vitals:   01/04/21 1703 01/04/21 1717 01/04/21 2031 01/05/21 0413  BP: (!) 141/87 (!) 146/79 100/66 101/60  Pulse: 92 93 84 85  Resp: 18 20 18 18   Temp: 98.6 F (37 C) 98.7 F (37.1 C) 98.4 F (36.9 C) 98.5 F (36.9 C)  TempSrc: Oral Oral Oral Oral  SpO2: (!) 89% 93% 93% 96%  Weight:      Height:        Intake/Output Summary (Last 24 hours) at 01/05/2021 0752 Last data filed at 01/05/2021 0300 Gross per 24 hour  Intake 1401.93 ml  Output 45 ml  Net 1356.93 ml   Filed Weights   01/01/21 1634  Weight: 51.3 kg    Examination:  General exam: Appears uncomfortable  Respiratory system: decreased breath sounds b/l  Cardiovascular system:  S1 & S2+. No rubs or clicks   Gastrointestinal system: Abd is soft, NT,ND & hypoactive bowel sounds  Central nervous system: Alert and oriented. Moves all extremities  Extremities: decreased erythema, decreased edema &  wound vac of LLE  Psychiatry: judgement and insight appear normal. Appropriate mood and affect    Data Reviewed: I have personally reviewed following labs and imaging studies  CBC: Recent Labs  Lab 12/29/20 1738 12/30/20 0518 01/01/21 1652 01/02/21 0407 01/03/21 0801 01/04/21 0404 01/05/21 0353  WBC 21.5*   < > 27.3* 19.0* 16.3* 16.1* 15.7*  NEUTROABS 17.6*  --  19.5*  --   --   --   --   HGB 11.1*   < > 10.9* 8.8* 9.3* 10.6* 8.1*  HCT 33.3*   < > 32.8* 26.4* 27.9* 31.5* 24.8*  MCV 90.0   < > 89.6 89.8 89.4 88.2 91.5  PLT 363   < > 532* 428* 527* 620* 529*   < > = values in this interval not  displayed.   Basic Metabolic Panel: Recent Labs  Lab 01/01/21 1652 01/02/21 0407 01/03/21 0801 01/04/21 0404 01/05/21 0353  NA 132* 138 136 138 138  K 4.0 3.9 4.1 4.1 4.1  CL 98 102 105 104 106  CO2 22 29 25 26 26   GLUCOSE 110* 127* 91 160* 150*  BUN 9 15 9 10 20   CREATININE 0.65 0.76 0.53 0.62 0.80  CALCIUM 8.6* 8.6* 8.3* 8.6* 8.1*   GFR: Estimated Creatinine Clearance: 53 mL/min (by C-G formula based on SCr of 0.8 mg/dL). Liver Function Tests: Recent Labs  Lab 12/29/20 1738 01/01/21 1652  AST 30 25  ALT 42 36  ALKPHOS 167* 199*  BILITOT 0.5 0.5  PROT 5.9* <3.0*  ALBUMIN 2.4* <1.0*   No results for input(s): LIPASE, AMYLASE in the last 168 hours. No results for input(s): AMMONIA in the last 168 hours. Coagulation Profile: Recent Labs  Lab 12/29/20 1738  INR 1.0   Cardiac Enzymes: Recent Labs  Lab 12/30/20 0518 01/02/21 0407  CKTOTAL 155 82   BNP (last 3 results) No results for input(s): PROBNP in the last 8760 hours. HbA1C: No results for input(s): HGBA1C in the last 72 hours. CBG: No results for input(s): GLUCAP in the last 168 hours. Lipid Profile: No results for input(s): CHOL, HDL, LDLCALC, TRIG, CHOLHDL, LDLDIRECT in the last 72 hours. Thyroid Function Tests: No results for input(s): TSH, T4TOTAL, FREET4, T3FREE, THYROIDAB in the last 72 hours. Anemia Panel: No results for input(s): VITAMINB12, FOLATE, FERRITIN, TIBC, IRON, RETICCTPCT in the last 72 hours. Sepsis Labs: Recent Labs  Lab 12/29/20 1738 12/29/20 2147 12/30/20 0518  PROCALCITON  --   --  0.35  LATICACIDVEN 1.1 1.0  --     Recent Results (from the past 240 hour(s))  Culture, blood (Routine x 2)     Status: None   Collection Time: 12/29/20  5:38 PM   Specimen: BLOOD  Result Value Ref Range Status   Specimen Description BLOOD BLOOD LEFT FOREARM  Final   Special Requests   Final    BOTTLES DRAWN AEROBIC AND ANAEROBIC Blood Culture results may not be optimal due to an  inadequate volume of blood received in culture bottles   Culture   Final    NO GROWTH 5 DAYS Performed at [facility], [address] [place], [place] [address]    Report Status 01/03/2021 FINAL  Final  Culture, blood (Routine x 2)     Status: None   Collection Time: 12/29/20  5:42 PM   Specimen: BLOOD  Result Value Ref Range Status   Specimen Description BLOOD BLOOD RIGHT FOREARM  Final   Special Requests   Final  BOTTLES DRAWN AEROBIC AND ANAEROBIC Blood Culture results may not be optimal due to an inadequate volume of blood received in culture bottles   Culture   Final    NO GROWTH 5 DAYS Performed at [facility], [address] [place], [place] [address]    Report Status 01/03/2021 FINAL  Final  SARS CORONAVIRUS 2 (TAT 6-24 HRS) Nasopharyngeal Nasopharyngeal Swab     Status: None   Collection Time: 12/29/20  8:46 PM   Specimen: Nasopharyngeal Swab  Result Value Ref Range Status   SARS Coronavirus 2 NEGATIVE NEGATIVE Final    Comment: (NOTE) SARS-CoV-2 target nucleic acids are NOT DETECTED.  The SARS-CoV-2 RNA is generally detectable in upper and lower respiratory specimens during the acute phase of infection. Negative results do not preclude SARS-CoV-2 infection, do not rule out co-infections with other pathogens, and should not be used as the sole basis for treatment or other patient management decisions. Negative results must be combined with clinical observations, patient history, and epidemiological information. The expected result is Negative.  Fact Sheet for Patients: SugarRoll.be  Fact Sheet for Healthcare Providers: https://www.woods-mathews.com/  This test is not yet approved or cleared by the [place] FDA and  has been authorized for detection and/or diagnosis of SARS-CoV-2 by FDA under an Emergency Use Authorization (EUA). This EUA will remain  in effect (meaning this test can be used)  for the duration of the COVID-19 declaration under Se ction 564(b)(1) of the Act, 21 U.S.C. section 360bbb-3(b)(1), unless the authorization is terminated or revoked sooner.  Performed at [facility], [place] [address] [place], [place] [address]   MRSA PCR Screening     Status: Abnormal   Collection Time: 12/29/20 11:45 PM   Specimen: Nasopharyngeal  Result Value Ref Range Status   MRSA by PCR POSITIVE (A) NEGATIVE Final    Comment:        The GeneXpert MRSA Assay (FDA approved for NASAL specimens only), is one component of a comprehensive MRSA colonization surveillance program. It is not intended to diagnose MRSA infection nor to guide or monitor treatment for MRSA infections. RESULT CALLED TO, READ BACK BY AND VERIFIED WITH: NAKAY PATRICK AT 0148 12/30/20 MF Performed at [facility], [address] [place], [place] [address]   Resp Panel by RT-PCR (Flu A&B, Covid) Nasopharyngeal Swab     Status: None   Collection Time: 01/01/21  9:14 PM   Specimen: Nasopharyngeal Swab; Nasopharyngeal(NP) swabs in vial transport medium  Result Value Ref Range Status   SARS Coronavirus 2 by RT PCR NEGATIVE NEGATIVE Final    Comment: (NOTE) SARS-CoV-2 target nucleic acids are NOT DETECTED.  The SARS-CoV-2 RNA is generally detectable in upper respiratory specimens during the acute phase of infection. The lowest concentration of SARS-CoV-2 viral copies this assay can detect is 138 copies/mL. A negative result does not preclude SARS-Cov-2 infection and should not be used as the sole basis for treatment or other patient management decisions. A negative result may occur with  improper specimen collection/handling, submission of specimen other than nasopharyngeal swab, presence of viral mutation(s) within the areas targeted by this assay, and inadequate number of viral copies(<138 copies/mL). A negative result must be combined with clinical observations, patient history, and  epidemiological information. The expected result is Negative.  Fact Sheet for Patients:  EntrepreneurPulse.com.au  Fact Sheet for Healthcare Providers:  IncredibleEmployment.be  This test is no t yet approved or cleared by the [place] FDA and  has  been authorized for detection and/or diagnosis of SARS-CoV-2 by FDA under an Emergency Use Authorization (EUA). This EUA will remain  in effect (meaning this test can be used) for the duration of the COVID-19 declaration under Section 564(b)(1) of the Act, 21 U.S.C.section 360bbb-3(b)(1), unless the authorization is terminated  or revoked sooner.       Influenza A by PCR NEGATIVE NEGATIVE Final   Influenza B by PCR NEGATIVE NEGATIVE Final    Comment: (NOTE) The Xpert Xpress SARS-CoV-2/FLU/RSV plus assay is intended as an aid in the diagnosis of influenza from Nasopharyngeal swab specimens and should not be used as a sole basis for treatment. Nasal washings and aspirates are unacceptable for Xpert Xpress SARS-CoV-2/FLU/RSV testing.  Fact Sheet for Patients: EntrepreneurPulse.com.au  Fact Sheet for Healthcare Providers: IncredibleEmployment.be  This test is not yet approved or cleared by the [place] FDA and has been authorized for detection and/or diagnosis of SARS-CoV-2 by FDA under an Emergency Use Authorization (EUA). This EUA will remain in effect (meaning this test can be used) for the duration of the COVID-19 declaration under Section 564(b)(1) of the Act, 21 U.S.C. section 360bbb-3(b)(1), unless the authorization is terminated or revoked.  Performed at [facility], [place], [place] [address]   Aerobic Culture w Gram Stain (superficial specimen)     Status: None (Preliminary result)   Collection Time: 01/02/21  2:15 PM   Specimen: Wound  Result Value Ref Range Status   Specimen Description   Final     WOUND Performed at [facility], [address], [place], [place] [address]    Special Requests   Final    LL Performed at [facility], [address] [place], [place] [address]    Gram Stain   Final    FEW WBC PRESENT, PREDOMINANTLY PMN NO ORGANISMS SEEN    Culture   Final    RARE STAPHYLOCOCCUS AUREUS SUSCEPTIBILITIES TO FOLLOW Performed at [facility], [place] [address], [place], [place] [address]    Report Status PENDING  Incomplete  Aerobic/Anaerobic Culture w Gram Stain (surgical/deep wound)     Status: None (Preliminary result)   Collection Time: 01/04/21  2:37 PM   Specimen: PATH Other; Tissue  Result Value Ref Range Status   Specimen Description   Final    ABSCESS LEFT LEG Performed at [facility], [address], [place], [place] [address]    Special Requests   Final    LEFT LEG Performed at [facility], [place], [place] [address]    Gram Stain   Final    FEW WBC PRESENT, PREDOMINANTLY PMN ABUNDANT GRAM POSITIVE COCCI IN PAIRS Performed at [facility], [place] [address], [place], [place] [address]    Culture PENDING  Incomplete   Report Status PENDING  Incomplete  Aerobic/Anaerobic Culture w Gram Stain (surgical/deep wound)     Status: None (Preliminary result)   Collection Time: 01/04/21  2:38 PM   Specimen: PATH Other; Tissue  Result Value Ref Range Status   Specimen Description   Final    ABSCESS LEFT LEG Performed at [facility], [place] [address] [place], [place] [address]    Special Requests   Final    LEFT LEG Performed at [facility], [place], [place] [address]    Gram Stain   Final    FEW WBC PRESENT, PREDOMINANTLY PMN ABUNDANT GRAM POSITIVE COCCI IN  PAIRS Performed at [facility], [place] [address] [place], [place] [address]    Culture PENDING  Incomplete   Report Status PENDING  Incomplete         Radiology Studies: CT TIBIA FIBULA  LEFT W CONTRAST  Result Date: 01/04/2021 CLINICAL DATA:  Left lower leg soft tissue infection suspected. EXAM: CT OF THE LOWER LEFT EXTREMITY WITH CONTRAST TECHNIQUE: Multidetector CT imaging of the left lower leg was performed according to the standard protocol following intravenous contrast administration. CONTRAST:  157mL OMNIPAQUE IOHEXOL 300 MG/ML  SOLN COMPARISON:  MRI 01/03/2021 and 01/02/2021. Ankle radiographs 01/01/2021 and CT 12/30/2020. FINDINGS: Bones/Joint/Cartilage Images extend from the distal left femur through the left ankle. Patient is status post left total knee arthroplasty. No evidence of hardware loosening, acute fracture or dislocation. There is no bone destruction. No significant knee or ankle joint effusion or abnormal enhancement. Ligaments Suboptimally assessed by CT. Muscles and Tendons No intramuscular fluid collection or abnormal enhancement identified. The visualized ankle tendons appear intact. The extensor mechanism at the knee appears intact, although is partly obscured by artifact from the total knee arthroplasty. Soft tissues Again demonstrated is diffuse edema and ill-defined fluid throughout the subcutaneous fat of the left lower leg. Overall, this has mildly worsened compared with the prior CT of 4 days prior. There is ill-defined peripherally enhancing collection within the subcutaneous fat of the distal lower leg anterolaterally which measures approximately 6.1 x 1.1 cm transverse on image 244/7. This is unchanged from the MRI performed earlier the same date. No associated foreign body or soft tissue emphysema. No other focal fluid collections are identified. IMPRESSION: 1. Again demonstrated is marked circumferential subcutaneous edema and ill-defined fluid throughout the left lower leg consistent with soft tissue infection (cellulitis). This has mildly worsened from previous CT of 4 days prior. 2. An ill-defined peripherally enhancing fluid collection anterolaterally in  the distal left lower leg is better seen on MRI of the same date, and remains suspicious for an abscess. 3. No evidence of osteomyelitis or septic joint. Electronically Signed   By: Richardean Sale M.D.   On: 01/04/2021 09:15   MR TIBIA FIBULA LEFT W WO CONTRAST  Result Date: 01/03/2021 CLINICAL DATA:  Left lower extremity cellulitis EXAM: MRI OF LOWER LEFT EXTREMITY WITHOUT AND WITH CONTRAST TECHNIQUE: Multiplanar, multisequence MR imaging of the left tibia and fibula was performed both before and after administration of intravenous contrast. CONTRAST:  36mL GADAVIST GADOBUTROL 1 MMOL/ML IV SOLN COMPARISON:  Left ankle MRI 01/02/2021. 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 No evidence of acute fracture. No dislocation. No cortical destruction or periosteal elevation. No evidence of osteomyelitis. Please refer to MRI report 01/02/2021 for detailed evaluation of the ankle. Ligaments No acute ligamentous injury. Muscles and Tendons Mild lower leg intramuscular edema most pronounced within the medial head of the gastrocnemius muscle. No intramuscular fluid collection. No new tendinous abnormality. Soft tissues Lobulated thick walled, peripherally enhancing abscess overlying the lateral aspect of the lower leg measuring approximately 13.0 x 1.3 x 6.4 cm (series 29, image 34; series 35, image 21). Collection closely approximates the overlying skin surface at the level of the distal fibular diaphysis (series 29, image 30). Marked surrounding soft tissue edema and enhancement compatible with cellulitis. Circumferential edema and fluid are seen throughout the left lower extremity which extends into the distal thigh to a lesser extent (series 11, image 9). There is mild deep fascial edema and enhancement.  No deep fascial fluid collections. IMPRESSION: 1. Elongated complex peripherally enhancing abscess overlying the lateral aspect of the  lower leg measuring approximately 13.0 x 1.3 x 6.4 cm. Marked surrounding soft tissue edema and enhancement compatible with cellulitis. Mild deep fascial edema and enhancement suggesting fasciitis. 2. No evidence of acute osteomyelitis. Electronically Signed   By: Davina Poke D.O.   On: 01/03/2021 15:00        Scheduled Meds: . amphetamine-dextroamphetamine  20 mg Oral BID  . docusate sodium  200 mg Oral BID  . DULoxetine  60 mg Oral QPC lunch  . enoxaparin (LOVENOX) injection  40 mg Subcutaneous Q24H  . levothyroxine  50 mcg Oral Daily  . metoCLOPramide  5 mg Oral TID AC  . mometasone-formoterol  2 puff Inhalation BID  . pantoprazole  40 mg Oral Daily  . pravastatin  20 mg Oral Daily  . pregabalin  100 mg Oral TID  . QUEtiapine  800 mg Oral QHS  . rOPINIRole  2 mg Oral QHS  . ziprasidone  60 mg Oral QPM   Continuous Infusions: . sodium chloride Stopped (01/04/21 0616)  . sodium chloride 75 mL/hr at 01/04/21 1139  . ceFEPime (MAXIPIME) IV 2 g (01/05/21 0610)  . metronidazole 500 mg (01/05/21 0655)  . vancomycin       LOS: 4 days    Time spent: 33 mins    Wyvonnia Dusky, MD [facility] Pager [phone]  If 7PM-7AM, please contact night-coverage 01/05/2021, 7:52 AM

## 2021-01-05 NOTE — Progress Notes (Signed)
POD # 1debridement and wound VAC of necrotizing infection in the left leg Patient feels better.  Afebrile.Vss Gram gram + cocci  PE NAD Wound vac in place, persistent cellulitis on ankle and foot but improved as compared to yesterday am.  A/p  Doing better Next wound vac change Tuesday. continue current a/bs No need for further debridements at this [facility]

## 2021-01-06 ENCOUNTER — Encounter
Admission: EM | Disposition: A | Discharge status: Home / Self Care | Payer: Self-pay | Source: Home / Self Care | Attending: Internal Medicine

## 2021-01-06 ENCOUNTER — Encounter: Payer: Self-pay | Admitting: Vascular Surgery

## 2021-01-06 ENCOUNTER — Other Ambulatory Visit (INDEPENDENT_AMBULATORY_CARE_PROVIDER_SITE_OTHER): Payer: Self-pay | Admitting: Vascular Surgery

## 2021-01-06 DIAGNOSIS — D75839 Thrombocytosis, unspecified: Secondary | ICD-10-CM | POA: Diagnosis not present

## 2021-01-06 DIAGNOSIS — L02416 Cutaneous abscess of left lower limb: Secondary | ICD-10-CM | POA: Diagnosis not present

## 2021-01-06 DIAGNOSIS — L089 Local infection of the skin and subcutaneous tissue, unspecified: Secondary | ICD-10-CM

## 2021-01-06 DIAGNOSIS — L97829 Non-pressure chronic ulcer of other part of left lower leg with unspecified severity: Secondary | ICD-10-CM

## 2021-01-06 DIAGNOSIS — L03116 Cellulitis of left lower limb: Secondary | ICD-10-CM | POA: Diagnosis not present

## 2021-01-06 DIAGNOSIS — D649 Anemia, unspecified: Secondary | ICD-10-CM | POA: Diagnosis not present

## 2021-01-06 HISTORY — PX: LOWER EXTREMITY ANGIOGRAPHY: CATH118251

## 2021-01-06 LAB — BASIC METABOLIC PANEL
Anion gap: 8 (ref 5–15)
BUN: 13 mg/dL (ref 8–23)
CO2: 23 mmol/L (ref 22–32)
Calcium: 8.4 mg/dL — ABNORMAL LOW (ref 8.9–10.3)
Chloride: 105 mmol/L (ref 98–111)
Creatinine, Ser: 0.68 mg/dL (ref 0.44–1.00)
GFR, Estimated: >60 mL/min (ref >60)
Glucose, Bld: 113 mg/dL — ABNORMAL HIGH (ref 70–99)
Potassium: 4.2 mmol/L (ref 3.5–5.1)
Sodium: 136 mmol/L (ref 135–145)

## 2021-01-06 LAB — CBC
HCT: 31.9 % — ABNORMAL LOW (ref 36.0–46.0)
Hemoglobin: 9.9 g/dL — ABNORMAL LOW (ref 12.0–15.0)
MCH: 29.5 pg (ref 26.0–34.0)
MCHC: 31 g/dL (ref 30.0–36.0)
MCV: 94.9 fL (ref 80.0–100.0)
Platelets: 580 10*3/uL — ABNORMAL HIGH (ref 150–400)
RBC: 3.36 MIL/uL — ABNORMAL LOW (ref 3.87–5.11)
RDW: 15.5 % (ref 11.5–15.5)
WBC: 9.3 10*3/uL (ref 4.0–10.5)
nRBC: 0 % (ref 0.0–0.2)

## 2021-01-06 SURGERY — LOWER EXTREMITY ANGIOGRAPHY
Anesthesia: Moderate Sedation | Laterality: Left

## 2021-01-06 MED ORDER — IODIXANOL 320 MG/ML IV SOLN
INTRAVENOUS | Status: DC | PRN
  Administered 2021-01-06: 30 mL via INTRA_ARTERIAL

## 2021-01-06 MED ORDER — METRONIDAZOLE 500 MG PO TABS
500.0000 mg | ORAL_TABLET | ORAL | Status: DC
  Administered 2021-01-06 – 2021-01-07 (×2): 500 mg via ORAL
  Filled 2021-01-06 (×4): qty 1

## 2021-01-06 MED ORDER — MIDAZOLAM HCL 2 MG/2ML IJ SOLN
INTRAMUSCULAR | Status: DC | PRN
  Administered 2021-01-06: 2 mg via INTRAVENOUS

## 2021-01-06 MED ORDER — SODIUM CHLORIDE FLUSH 0.9 % IV SOLN
INTRAVENOUS | Status: AC
  Filled 2021-01-06: qty 10

## 2021-01-06 MED ORDER — FAMOTIDINE 20 MG PO TABS: 40.0000 mg | ORAL_TABLET | Freq: Once | ORAL | Status: AC | PRN

## 2021-01-06 MED ORDER — METHYLPREDNISOLONE SODIUM SUCC 125 MG IJ SOLR
INTRAMUSCULAR | Status: AC
  Administered 2021-01-06: 125 mg via INTRAVENOUS
  Filled 2021-01-06: qty 2

## 2021-01-06 MED ORDER — METHYLPREDNISOLONE SODIUM SUCC 125 MG IJ SOLR: 125.0000 mg | Freq: Once | INTRAMUSCULAR | Status: AC | PRN

## 2021-01-06 MED ORDER — HYDROMORPHONE HCL 1 MG/ML IJ SOLN
INTRAMUSCULAR | Status: AC
  Administered 2021-01-06: 1 mg via INTRAVENOUS
  Filled 2021-01-06: qty 1

## 2021-01-06 MED ORDER — LINACLOTIDE 72 MCG PO CAPS
72.0000 ug | ORAL_CAPSULE | Freq: Two times a day (BID) | ORAL | Status: DC
  Administered 2021-01-06 – 2021-01-07 (×2): 72 ug via ORAL
  Filled 2021-01-06 (×3): qty 1

## 2021-01-06 MED ORDER — DIPHENHYDRAMINE HCL 50 MG/ML IJ SOLN: 50.0000 mg | Freq: Once | INTRAMUSCULAR | Status: AC | PRN

## 2021-01-06 MED ORDER — SODIUM CHLORIDE 0.9 % IV SOLN: INTRAVENOUS | Status: DC

## 2021-01-06 MED ORDER — HYDROMORPHONE HCL 1 MG/ML IJ SOLN: 1.0000 mg | Freq: Once | INTRAMUSCULAR | Status: AC

## 2021-01-06 MED ORDER — HEPARIN SODIUM (PORCINE) 1000 UNIT/ML IJ SOLN
INTRAMUSCULAR | Status: AC
  Filled 2021-01-06: qty 1

## 2021-01-06 MED ORDER — MIDAZOLAM HCL 2 MG/ML PO SYRP
8.0000 mg | ORAL_SOLUTION | Freq: Once | ORAL | Status: AC | PRN
  Administered 2021-01-06: 8 mg via ORAL
  Filled 2021-01-06: qty 4

## 2021-01-06 MED ORDER — MIDAZOLAM HCL 2 MG/ML PO SYRP
ORAL_SOLUTION | ORAL | Status: AC
  Filled 2021-01-06: qty 4

## 2021-01-06 MED ORDER — ONDANSETRON HCL 4 MG/2ML IJ SOLN
4.0000 mg | Freq: Four times a day (QID) | INTRAMUSCULAR | Status: DC | PRN
Start: 2021-01-06 — End: 2021-01-07

## 2021-01-06 MED ORDER — MIDAZOLAM HCL 5 MG/5ML IJ SOLN
INTRAMUSCULAR | Status: AC
  Filled 2021-01-06: qty 5

## 2021-01-06 MED ORDER — FAMOTIDINE 20 MG PO TABS
ORAL_TABLET | ORAL | Status: AC
  Administered 2021-01-06: 40 mg via ORAL
  Filled 2021-01-06: qty 2

## 2021-01-06 MED ORDER — DIPHENHYDRAMINE HCL 50 MG/ML IJ SOLN
INTRAMUSCULAR | Status: AC
  Administered 2021-01-06: 50 mg via INTRAVENOUS
  Filled 2021-01-06: qty 1

## 2021-01-06 SURGICAL SUPPLY — 9 items
CATH ANGIO 5F PIGTAIL 65CM (CATHETERS) ×2 IMPLANT
COVER PROBE U/S 5X48 (MISCELLANEOUS) ×2 IMPLANT
DEVICE STARCLOSE SE CLOSURE (Vascular Products) ×2 IMPLANT
GLIDEWIRE ADV .035X260CM (WIRE) ×1 IMPLANT
PACK ANGIOGRAPHY (CUSTOM PROCEDURE TRAY) ×2 IMPLANT
SHEATH BRITE TIP 5FRX11 (SHEATH) ×1 IMPLANT
SYR MEDRAD MARK 7 150ML (SYRINGE) ×1 IMPLANT
TUBING CONTRAST HIGH PRESS 48 (TUBING) ×4 IMPLANT
WIRE GUIDERIGHT .035X150 (WIRE) ×2 IMPLANT
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
## 2021-01-06 NOTE — Op Note (Signed)
Jamie Schultz  Percutaneous Study/Intervention Procedural Note   Date of Surgery: 01/01/2021 - 01/06/2021  Surgeon(s):Edwardine Deschepper    Assistants:none  Pre-operative Diagnosis: Infected ulceration left lower extremity  Post-operative diagnosis:  Same  Procedure(s) Performed:             1.  Ultrasound guidance for vascular access right femoral artery             2.  Catheter placement into left SFA from right femoral approach             3.  Aortogram and selective left lower extremity angiogram             4.  StarClose closure device right femoral artery  EBL: 5 cc  Contrast: 30 cc  Fluoro Time: 2.6 minutes  Moderate Conscious Sedation Time: approximately 15 minutes using 1 mg of Versed and 1 mg of Dilaudid              Indications:  Patient is a 60 y.o.female with an infected ulceration of the left lower extremity. The patient is brought in for angiography for further evaluation and potential treatment.  Due to the limb threatening nature of the situation, angiogram was performed for attempted limb salvage. The patient is aware that if the procedure fails, amputation would be expected.  The patient also understands that even with successful revascularization, amputation may still be required due to the severity of the situation.  Risks and benefits are discussed and informed consent is obtained.   Procedure:  The patient was identified and appropriate procedural time out was performed.  The patient was then placed supine on the table and prepped and draped in the usual sterile fashion. Moderate conscious sedation was administered during a face to face encounter with the patient throughout the procedure with my supervision of the RN administering medicines and monitoring the patient's vital signs, pulse oximetry, telemetry and mental status throughout from the start of the procedure until the patient was taken to the recovery room. Ultrasound was used to evaluate the  right common femoral artery.  It was patent .  A digital ultrasound image was acquired.  A Seldinger needle was used to access the right common femoral artery under direct ultrasound guidance and a permanent image was performed.  A 0.035 J wire was advanced without resistance and a 5Fr sheath was placed.  Pigtail catheter was placed into the aorta and an AP aortogram was performed. This demonstrated normal renal arteries and normal aorta and iliac segments without significant stenosis. I then crossed the aortic bifurcation and advanced to the left femoral head and then into the proximal left superficial femoral artery. Selective left lower extremity angiogram was then performed. This demonstrated normal common femoral artery, profunda femoris artery, superficial femoral artery, and popliteal artery.  There is a normal tibial trifurcation with three-vessel runoff without focal stenosis.  Her arterial perfusion was normal and adequate for wound healing and no revascularization was required.  I elected to terminate the procedure. The sheath was removed and StarClose closure device was deployed in the right femoral artery with excellent hemostatic result. The patient was taken to the recovery room in stable condition having tolerated the procedure well.  Findings:               Aortogram:  Normal renal arteries, normal aorta and iliac arteries without significant stenosis             Left lower Extremity:  Normal  common femoral artery, profunda femoris artery, superficial femoral artery, and popliteal artery.  There is a normal tibial trifurcation with three-vessel runoff without focal stenosis.   Disposition: Patient was taken to the recovery room in stable condition having tolerated the procedure well.  Complications: None  Jamie Schultz 01/06/2021 2:45 PM   This note was created with Dragon Medical transcription system. Any errors in dictation are purely unintentional.

## 2021-01-06 NOTE — Progress Notes (Addendum)
[place] Hospital Day(s): 5.   Post op day(s): 2 Days Post-Op.   Interval History:  Patient seen and examined No acute events or new complaints overnight.  Patient reports she is feeling better, erythema and swelling to the LLE are improved. She continues to complain of pain and asking for IV narcotics. I did explain to her that pain control will be challenging given her history of PO narcotic use daily at home Her leukocytosis is now resolved; WBC 9.3K; no fever She is maintaining her renal function; sCr - 0.68; OU - 1500 ccs + unmeasured No electrolyte derangements Wound vac in place; serosanguinous  Cx from OR growing gram + cocci in pairs; on Cefepime + Vancomycin    Vital signs in last 24 hours: [min-max] current  Temp:  [97.9 F (36.6 C)-99.2 F (37.3 C)] 98.2 F (36.8 C) (05/09 0629) Pulse Rate:  [73-86] 77 (05/09 0629) Resp:  [17-19] 19 (05/09 0629) BP: (97-148)/(65-99) 123/79 (05/09 0629) SpO2:  [92 %-96 %] 96 % (05/09 0629)     Height: 5' (152.4 cm) Weight: 51.3 kg BMI (Calculated): 22.07   Intake/Output last 2 shifts:  05/08 0701 - 05/09 0700 In: 2403.8 [P.O.:500; I.V.:1103.7; IV Piggyback:800.1] Out: 1500 [Urine:1500]   Physical Exam:  Constitutional: alert, cooperative and no distress  Respiratory: breathing non-labored at rest  Cardiovascular: regular rate and sinus rhythm  Integumentary: Wound vac to the left lower lateral leg, surrounding erythema and edema improved, this is still tender  Labs:  CBC Latest Ref Rng & Units 01/06/2021 01/05/2021 01/04/2021  WBC 4.0 - 10.5 K/uL 9.3 15.7(H) 16.1(H)  Hemoglobin 12.0 - 15.0 g/dL 9.9(L) 8.1(L) 10.6(L)  Hematocrit 36.0 - 46.0 % 31.9(L) 24.8(L) 31.5(L)  Platelets 150 - 400 K/uL 580(H) 529(H) 620(H)   CMP Latest Ref Rng & Units 01/06/2021 01/05/2021 01/04/2021  Glucose 70 - 99 mg/dL 113(H) 150(H) 160(H)  BUN 8 - 23 mg/dL 13 20 10   Creatinine 0.44 - 1.00 mg/dL 0.68 0.80 0.62   Sodium 135 - 145 mmol/L 136 138 138  Potassium 3.5 - 5.1 mmol/L 4.2 4.1 4.1  Chloride 98 - 111 mmol/L 105 106 104  CO2 22 - 32 mmol/L 23 26 26   Calcium 8.9 - 10.3 mg/dL 8.4(L) 8.1(L) 8.6(L)  Total Protein 6.5 - 8.1 g/dL - - -  Total Bilirubin 0.3 - 1.2 mg/dL - - -  Alkaline Phos 38 - 126 U/L - - -  AST 15 - 41 U/L - - -  ALT 0 - 44 U/L - - -     Imaging studies: No new pertinent imaging studies   Assessment/Plan:  60 y.o. female 2 Days Post-Op s/p incision and drainage with wound vac placement for left leg abscess with possible soft tissue necrosis   - Will plan on wound vac change tomorrow at bedside; will discuss with CSW to see if she will qualify for home health; orders for this placed  - Continue IV ABx (Cefepime + Vancomycin); follow up Cx from OR  - Pain control prn; I did explain to her that pain control will be challenging given her history of narcotic use a baseline   - Monitor leukocytosis; resolved  - Okay to ambulate as tolerated; engage PT if needed  - Further management per primary team; we will follow     - Discharge Planning; Potentially home tomorrow pending home wound vac   All of the above findings and recommendations were discussed with the patient, and the  medical team, and all of patient's questions were answered to her expressed satisfaction.  -- Edison Simon, PA-C [facility] 01/06/2021, 7:31 AM [phone] M-F: 7am - 4pm
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
## 2021-01-06 NOTE — Progress Notes (Signed)
PHARMACIST - PHYSICIAN COMMUNICATION  DR:  Hospitalist  CONCERNING: IV to Oral Route Change Policy  RECOMMENDATION: This patient is receiving metronidazole by the intravenous route.  Based on criteria approved by the Pharmacy and Therapeutics Committee, the intravenous medication(s) is/are being converted to the equivalent oral dose form(s).   DESCRIPTION: These criteria include:  The patient is eating (either orally or via tube) and/or has been taking other orally administered medications for a least 24 hours  The patient has no evidence of active gastrointestinal bleeding or impaired GI absorption (gastrectomy, short bowel, patient on TNA or NPO).  If you have questions about this conversion, please contact the Pharmacy Department  []   [phone] )  [facility] [x]   [phone] )  [facility] []   [phone] )  [facility] []   [phone] )  [facility] []   [phone] )  [place], PharmD, BCPS.   Work Cell: [phone] 01/06/2021 4:31 PM
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
## 2021-01-06 NOTE — Plan of Care (Signed)
  Problem: Activity: Goal: Risk for activity intolerance will decrease Outcome: Progressing   Problem: Nutrition: Goal: Adequate nutrition will be maintained Outcome: Progressing   Problem: Coping: Goal: Level of anxiety will decrease Outcome: Progressing   Problem: Safety: Goal: Ability to remain free from injury will improve Outcome: Progressing   Problem: Skin Integrity: Goal: Risk for impaired skin integrity will decrease Outcome: Progressing   

## 2021-01-07 ENCOUNTER — Other Ambulatory Visit (HOSPITAL_COMMUNITY): Payer: Self-pay

## 2021-01-07 DIAGNOSIS — L03116 Cellulitis of left lower limb: Secondary | ICD-10-CM | POA: Diagnosis not present

## 2021-01-07 DIAGNOSIS — L02416 Cutaneous abscess of left lower limb: Secondary | ICD-10-CM | POA: Diagnosis not present

## 2021-01-07 DIAGNOSIS — D75839 Thrombocytosis, unspecified: Secondary | ICD-10-CM | POA: Diagnosis not present

## 2021-01-07 DIAGNOSIS — G8929 Other chronic pain: Secondary | ICD-10-CM | POA: Diagnosis not present

## 2021-01-07 LAB — CBC
HCT: 26.7 % — ABNORMAL LOW (ref 36.0–46.0)
Hemoglobin: 8.8 g/dL — ABNORMAL LOW (ref 12.0–15.0)
MCH: 29.7 pg (ref 26.0–34.0)
MCHC: 33 g/dL (ref 30.0–36.0)
MCV: 90.2 fL (ref 80.0–100.0)
Platelets: 613 10*3/uL — ABNORMAL HIGH (ref 150–400)
RBC: 2.96 MIL/uL — ABNORMAL LOW (ref 3.87–5.11)
RDW: 14.9 % (ref 11.5–15.5)
WBC: 9.4 10*3/uL (ref 4.0–10.5)
nRBC: 0 % (ref 0.0–0.2)

## 2021-01-07 LAB — BASIC METABOLIC PANEL
Anion gap: 9 (ref 5–15)
BUN: 18 mg/dL (ref 8–23)
CO2: 21 mmol/L — ABNORMAL LOW (ref 22–32)
Calcium: 8.2 mg/dL — ABNORMAL LOW (ref 8.9–10.3)
Chloride: 107 mmol/L (ref 98–111)
Creatinine, Ser: 0.71 mg/dL (ref 0.44–1.00)
GFR, Estimated: >60 mL/min (ref >60)
Glucose, Bld: 152 mg/dL — ABNORMAL HIGH (ref 70–99)
Potassium: 4 mmol/L (ref 3.5–5.1)
Sodium: 137 mmol/L (ref 135–145)

## 2021-01-07 LAB — AEROBIC/ANAEROBIC CULTURE W GRAM STAIN (SURGICAL/DEEP WOUND)

## 2021-01-07 MED ORDER — OXYCODONE HCL 15 MG PO TABS: 15.0000 mg | ORAL_TABLET | Freq: Four times a day (QID) | ORAL | 0 refills | Status: DC | PRN

## 2021-01-07 MED ORDER — OXYCODONE HCL 15 MG PO TABS: 15.0000 mg | ORAL_TABLET | Freq: Four times a day (QID) | ORAL | 0 refills | Status: AC | PRN

## 2021-01-07 MED ORDER — DOXYCYCLINE MONOHYDRATE 150 MG PO CAPS: 150.0000 mg | ORAL_CAPSULE | Freq: Two times a day (BID) | ORAL | 0 refills | Status: AC

## 2021-01-07 MED ORDER — METRONIDAZOLE 500 MG PO TABS: 500.0000 mg | ORAL_TABLET | Freq: Three times a day (TID) | ORAL | 0 refills | Status: AC

## 2021-01-07 MED ORDER — DOXYCYCLINE HYCLATE 100 MG PO TBEC: 100.0000 mg | DELAYED_RELEASE_TABLET | Freq: Two times a day (BID) | ORAL | 0 refills | Status: DC

## 2021-01-07 NOTE — Discharge Summary (Signed)
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 metatarsal-phalangeal joint, intertarsal joints, and ankle joint. There is an acute nondisplaced fracture at the base of the fifth metatarsal with extension to the articular surface. No other fractures are identified. Mild soft tissue swelling about the lateral malleolus of the ankle. 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 IMPRESSION: 1. Acute nondisplaced fracture at the base of the right fifth metatarsal with extension to the articular surface. 2. Degenerative changes in the right foot and ankle. Electronically Signed   By: Lucienne Capers M.D.   On: 12/22/2020 19:16       Subjective: Pt c/o LLE pain   Discharge Exam: Vitals:   01/07/21 0334 01/07/21 0859  BP: 137/84   Pulse: 85 86  Resp: 20 19  Temp: 97.9 F (36.6 C) 98 F (36.7 C)  SpO2: 95% 96%   Vitals:   01/06/21 2349 01/07/21 0118 01/07/21 0334 01/07/21 0859  BP: 94/61 (!) 86/57 137/84   Pulse: 83 77 85 86  Resp:  20 20 19   Temp:  97.7 F (36.5 C) 97.9 F (36.6 C) 98 F (36.7 C)   TempSrc:  Oral Oral Oral  SpO2:  93% 95% 96%  Weight:      Height:        General: Pt is alert, awake, not in acute distress Cardiovascular: S1/S2 +, no rubs, no gallops Respiratory: CTA bilaterally Abdominal: Soft, NT, ND,  bowel sounds + Extremities: LLE is slight edematous & tender palpation. No cyanosis     The results of significant diagnostics from this hospitalization (including imaging, microbiology, ancillary and laboratory) are listed below for reference.     Microbiology: Recent Results (from the past 240 hour(s))  Culture, blood (Routine x 2)     Status: None   Collection Time: 12/29/20  5:38 PM   Specimen: BLOOD  Result Value Ref Range Status   Specimen Description BLOOD BLOOD LEFT FOREARM  Final   Special Requests   Final    BOTTLES DRAWN AEROBIC AND ANAEROBIC Blood Culture results may not be optimal due to an inadequate volume of blood received in culture bottles   Culture   Final    NO GROWTH 5 DAYS Performed at [facility], [address] [place], [place] [address]    Report Status 01/03/2021 FINAL  Final  Culture, blood (Routine x 2)     Status: None   Collection Time: 12/29/20  5:42 PM   Specimen: BLOOD  Result Value Ref Range Status   Specimen Description BLOOD BLOOD RIGHT FOREARM  Final   Special Requests   Final    BOTTLES DRAWN AEROBIC AND ANAEROBIC Blood Culture results may not be optimal due to an inadequate volume of blood received in culture bottles   Culture   Final    NO GROWTH 5 DAYS Performed at [facility], [address] [place], [place] [address]    Report Status 01/03/2021 FINAL  Final  SARS CORONAVIRUS 2 (TAT 6-24 HRS) Nasopharyngeal Nasopharyngeal Swab     Status: None   Collection Time: 12/29/20  8:46 PM   Specimen: Nasopharyngeal Swab  Result Value Ref Range Status   SARS Coronavirus 2 NEGATIVE NEGATIVE Final    Comment: (NOTE) SARS-CoV-2 target nucleic acids are NOT DETECTED.  The SARS-CoV-2 RNA is  generally detectable in upper and lower respiratory specimens during the acute phase of infection. Negative results do not preclude SARS-CoV-2 infection, do not rule out co-infections with other pathogens, and should not be used as the sole basis for treatment or other patient management decisions. Negative results must be combined with clinical observations, patient history, and epidemiological information. The expected result is Negative.  Fact Sheet for Patients: SugarRoll.be  Fact Sheet for Healthcare Providers: https://www.woods-mathews.com/  This test is not yet approved or cleared by the [place] FDA and  has been authorized for detection and/or diagnosis of SARS-CoV-2 by FDA under an Emergency Use Authorization (EUA). This EUA will remain  in effect (meaning this test can be used) for the duration of the COVID-19 declaration under Se ction 564(b)(1) of the Act, 21 U.S.C. section 360bbb-3(b)(1), unless the authorization is terminated or revoked sooner.  Performed at [facility], [place] [address] [place], [place] [address]   MRSA PCR Screening     Status: Abnormal   Collection Time: 12/29/20 11:45 PM   Specimen: Nasopharyngeal  Result Value Ref Range Status   MRSA by PCR POSITIVE (A) NEGATIVE Final    Comment:        The GeneXpert MRSA Assay (FDA approved for NASAL specimens only), is one component of a comprehensive MRSA colonization surveillance program. It is not intended to diagnose MRSA infection nor to guide or monitor treatment for MRSA infections. RESULT CALLED TO, READ BACK BY AND VERIFIED WITH: [facility] PATRICK AT 0148 12/30/20 MF Performed at [facility], [address] [place], [place] [address]   Resp  Panel by RT-PCR (Flu A&B, Covid) Nasopharyngeal Swab     Status: None   Collection Time: 01/01/21  9:14 PM   Specimen: Nasopharyngeal Swab; Nasopharyngeal(NP) swabs in vial transport medium   Result Value Ref Range Status   SARS Coronavirus 2 by RT PCR NEGATIVE NEGATIVE Final    Comment: (NOTE) SARS-CoV-2 target nucleic acids are NOT DETECTED.  The SARS-CoV-2 RNA is generally detectable in upper respiratory specimens during the acute phase of infection. The lowest concentration of SARS-CoV-2 viral copies this assay can detect is 138 copies/mL. A negative result does not preclude SARS-Cov-2 infection and should not be used as the sole basis for treatment or other patient management decisions. A negative result may occur with  improper specimen collection/handling, submission of specimen other than nasopharyngeal swab, presence of viral mutation(s) within the areas targeted by this assay, and inadequate number of viral copies(<138 copies/mL). A negative result must be combined with clinical observations, patient history, and epidemiological information. The expected result is Negative.  Fact Sheet for Patients:  EntrepreneurPulse.com.au  Fact Sheet for Healthcare Providers:  IncredibleEmployment.be  This test is no t yet approved or cleared by the [place] FDA and  has been authorized for detection and/or diagnosis of SARS-CoV-2 by FDA under an Emergency Use Authorization (EUA). This EUA will remain  in effect (meaning this test can be used) for the duration of the COVID-19 declaration under Section 564(b)(1) of the Act, 21 U.S.C.section 360bbb-3(b)(1), unless the authorization is terminated  or revoked sooner.       Influenza A by PCR NEGATIVE NEGATIVE Final   Influenza B by PCR NEGATIVE NEGATIVE Final    Comment: (NOTE) The Xpert Xpress SARS-CoV-2/FLU/RSV plus assay is intended as an aid in the diagnosis of influenza from Nasopharyngeal swab specimens and should not be used as a sole basis for treatment. Nasal washings and aspirates are unacceptable for Xpert Xpress SARS-CoV-2/FLU/RSV testing.  Fact Sheet for  Patients: EntrepreneurPulse.com.au  Fact Sheet for Healthcare Providers: IncredibleEmployment.be  This test is not yet approved or cleared by the [place] FDA and has been authorized for detection and/or diagnosis of SARS-CoV-2 by FDA under an Emergency Use Authorization (EUA). This EUA will remain in effect (meaning this test can be used) for the duration of the COVID-19 declaration under Section 564(b)(1) of the Act, 21 U.S.C. section 360bbb-3(b)(1), unless the authorization is terminated or revoked.  Performed at [facility], [place], [place] [address]   Aerobic Culture w Gram Stain (superficial specimen)     Status: None   Collection Time: 01/02/21  2:15 PM   Specimen: Wound  Result Value Ref Range Status   Specimen Description   Final    WOUND Performed at [facility], [address], [place], [place] [address]    Special Requests   Final    LL Performed at [facility], [address] [place], [place] [address]    Gram Stain   Final    FEW WBC PRESENT, PREDOMINANTLY PMN NO ORGANISMS SEEN Performed at [facility], [place] [address] [place],  [address]    Culture RARE METHICILLIN RESISTANT STAPHYLOCOCCUS AUREUS  Final   Report Status 01/05/2021 FINAL  Final   Organism ID, Bacteria METHICILLIN RESISTANT STAPHYLOCOCCUS AUREUS  Final      Susceptibility   Methicillin resistant staphylococcus aureus - MIC*    CIPROFLOXACIN >=8 RESISTANT Resistant     ERYTHROMYCIN >=8 RESISTANT Resistant     GENTAMICIN <=0.5  SENSITIVE Sensitive     OXACILLIN >=4 RESISTANT Resistant     TETRACYCLINE <=1 SENSITIVE Sensitive     VANCOMYCIN 1 SENSITIVE Sensitive     TRIMETH/SULFA >=320 RESISTANT Resistant     CLINDAMYCIN >=8 RESISTANT Resistant     RIFAMPIN <=0.5 SENSITIVE Sensitive     Inducible Clindamycin NEGATIVE Sensitive     * RARE METHICILLIN RESISTANT STAPHYLOCOCCUS AUREUS   Aerobic/Anaerobic Culture w Gram Stain (surgical/deep wound)     Status: None (Preliminary result)   Collection Time: 01/04/21  2:37 PM   Specimen: PATH Other; Tissue  Result Value Ref Range Status   Specimen Description   Final    ABSCESS LEFT LEG Performed at [facility], [place] [address], [place], [place] [address]    Special Requests   Final    LEFT LEG Performed at [facility], [address] [place], [place] [address]    Gram Stain   Final    FEW WBC PRESENT, PREDOMINANTLY PMN ABUNDANT GRAM POSITIVE COCCI IN PAIRS    Culture   Final    NO GROWTH 3 DAYS NO ANAEROBES ISOLATED; CULTURE IN PROGRESS FOR 5 DAYS Performed at [place] [address] [place], [place] [address]    Report Status PENDING  Incomplete  Aerobic/Anaerobic Culture w Gram Stain (surgical/deep wound)     Status: None   Collection Time: 01/04/21  2:38 PM   Specimen: PATH Other; Tissue  Result Value Ref Range Status   Specimen Description   Final    ABSCESS LEFT LEG Performed at [facility], [place] [address] [place], [place] [address]    Special Requests   Final    LEFT LEG Performed at [facility], [address] [place], [place] [address]    Gram Stain   Final    FEW WBC PRESENT, PREDOMINANTLY PMN ABUNDANT GRAM POSITIVE COCCI IN PAIRS    Culture   Final    RARE GROUP A STREP (S.PYOGENES) ISOLATED Beta hemolytic streptococci are predictably susceptible to penicillin and other beta lactams. Susceptibility testing not routinely performed. Performed at [facility], [place] [address], [place], [place] [address]    Report Status 01/07/2021 FINAL  Final     Labs: BNP (last 3 results) No results for input(s): BNP in the last 8760 hours. Basic Metabolic Panel: Recent Labs  Lab 01/03/21 0801 01/04/21 0404 01/05/21 0353 01/06/21 0406 01/07/21 0323  NA 136 138 138 136 137  K 4.1 4.1 4.1 4.2 4.0  CL 105 104 106 105 107  CO2 25 26 26 23  21*   GLUCOSE 91 160* 150* 113* 152*  BUN 9 10 20 13 18   CREATININE 0.53 0.62 0.80 0.68 0.71  CALCIUM 8.3* 8.6* 8.1* 8.4* 8.2*   Liver Function Tests: Recent Labs  Lab 01/01/21 1652  AST 25  ALT 36  ALKPHOS 199*  BILITOT 0.5  PROT <3.0*  ALBUMIN <1.0*   No results for input(s): LIPASE, AMYLASE in the last 168 hours. No results for input(s): AMMONIA in the last 168 hours. CBC: Recent Labs  Lab 01/01/21 1652 01/02/21 0407 01/03/21 0801 01/04/21 0404 01/05/21 0353 01/06/21 0406 01/07/21 0323  WBC 27.3*   < > 16.3* 16.1* 15.7* 9.3 9.4  NEUTROABS 19.5*  --   --   --   --   --   --   HGB 10.9*   < > 9.3* 10.6* 8.1* 9.9* 8.8*  HCT 32.8*   < >  27.9* 31.5* 24.8* 31.9* 26.7*  MCV 89.6   < > 89.4 88.2 91.5 94.9 90.2  PLT 532*   < > 527* 620* 529* 580* 613*   < > = values in this interval not displayed.   Cardiac Enzymes: Recent Labs  Lab 01/02/21 0407  CKTOTAL 82   BNP: Invalid input(s): POCBNP CBG: No results for input(s): GLUCAP in the last 168 hours. D-Dimer No results for input(s): DDIMER in the last 72 hours. Hgb A1c No results for input(s): HGBA1C in the last 72 hours. Lipid Profile No results for input(s): CHOL, HDL, LDLCALC, TRIG, CHOLHDL, LDLDIRECT in the last 72 hours. Thyroid function studies No results for input(s): TSH, T4TOTAL, T3FREE, THYROIDAB in the last 72 hours.  Invalid input(s): FREET3 Anemia work up No results for input(s): VITAMINB12, FOLATE, FERRITIN, TIBC, IRON, RETICCTPCT in the last 72 hours. Urinalysis    Component Value Date/Time   COLORURINE YELLOW 10/27/2019 [place] 10/27/2019 1134   APPEARANCEUR Clear 02/14/2014 2007   LABSPEC 1.011 10/27/2019 1134   LABSPEC 1.021 02/14/2014 2007   PHURINE 7.0 10/27/2019 1134   GLUCOSEU NEGATIVE 10/27/2019 1134   GLUCOSEU Negative 02/14/2014 2007   HGBUR NEGATIVE 10/27/2019 1134   BILIRUBINUR NEGATIVE 10/27/2019 1134   BILIRUBINUR NEGATIVE 06/14/2015 1419   BILIRUBINUR Negative  02/14/2014 2007   KETONESUR NEGATIVE 10/27/2019 1134   PROTEINUR NEGATIVE 10/27/2019 1134   UROBILINOGEN 0.2 06/14/2015 1419   UROBILINOGEN 0.2 03/10/2012 1900   NITRITE NEGATIVE 10/27/2019 1134   LEUKOCYTESUR NEGATIVE 10/27/2019 1134   LEUKOCYTESUR Negative 02/14/2014 2007   Sepsis Labs Invalid input(s): PROCALCITONIN,  WBC,  LACTICIDVEN Microbiology Recent Results (from the past 240 hour(s))  Culture, blood (Routine x 2)     Status: None   Collection Time: 12/29/20  5:38 PM   Specimen: BLOOD  Result Value Ref Range Status   Specimen Description BLOOD BLOOD LEFT FOREARM  Final   Special Requests   Final    BOTTLES DRAWN AEROBIC AND ANAEROBIC Blood Culture results may not be optimal due to an inadequate volume of blood received in culture bottles   Culture   Final    NO GROWTH 5 DAYS Performed at [facility], [address] [place], [place] [address]    Report Status 01/03/2021 FINAL  Final  Culture, blood (Routine x 2)     Status: None   Collection Time: 12/29/20  5:42 PM   Specimen: BLOOD  Result Value Ref Range Status   Specimen Description BLOOD BLOOD RIGHT FOREARM  Final   Special Requests   Final    BOTTLES DRAWN AEROBIC AND ANAEROBIC Blood Culture results may not be optimal due to an inadequate volume of blood received in culture bottles   Culture   Final    NO GROWTH 5 DAYS Performed at [facility], [address] [place], [place] [address]    Report Status 01/03/2021 FINAL  Final  SARS CORONAVIRUS 2 (TAT 6-24 HRS) Nasopharyngeal Nasopharyngeal Swab     Status: None   Collection Time: 12/29/20  8:46 PM   Specimen: Nasopharyngeal Swab  Result Value Ref Range Status   SARS Coronavirus 2 NEGATIVE NEGATIVE Final    Comment: (NOTE) SARS-CoV-2 target nucleic acids are NOT DETECTED.  The SARS-CoV-2 RNA is generally detectable in upper and lower respiratory specimens during the acute phase of infection. Negative results do not preclude SARS-CoV-2  infection, do not rule out co-infections with other pathogens, and should not be used as the  sole basis for treatment or other patient management decisions. Negative results must be combined with clinical observations, patient history, and epidemiological information. The expected result is Negative.  Fact Sheet for Patients: SugarRoll.be  Fact Sheet for Healthcare Providers: https://www.woods-mathews.com/  This test is not yet approved or cleared by the [place] FDA and  has been authorized for detection and/or diagnosis of SARS-CoV-2 by FDA under an Emergency Use Authorization (EUA). This EUA will remain  in effect (meaning this test can be used) for the duration of the COVID-19 declaration under Se ction 564(b)(1) of the Act, 21 U.S.C. section 360bbb-3(b)(1), unless the authorization is terminated or revoked sooner.  Performed at [facility], [place] [address], [place], [place] [address]   MRSA PCR Screening     Status: Abnormal   Collection Time: 12/29/20 11:45 PM   Specimen: Nasopharyngeal  Result Value Ref Range Status   MRSA by PCR POSITIVE (A) NEGATIVE Final    Comment:        The GeneXpert MRSA Assay (FDA approved for NASAL specimens only), is one component of a comprehensive MRSA colonization surveillance program. It is not intended to diagnose MRSA infection nor to guide or monitor treatment for MRSA infections. RESULT CALLED TO, READ BACK BY AND VERIFIED WITH: NAKAY PATRICK AT 0148 12/30/20 MF Performed at [facility], [address] [place], [place] [address]   Resp Panel by RT-PCR (Flu A&B, Covid) Nasopharyngeal Swab     Status: None   Collection Time: 01/01/21  9:14 PM   Specimen: Nasopharyngeal Swab; Nasopharyngeal(NP) swabs in vial transport medium  Result Value Ref Range Status   SARS Coronavirus 2 by RT PCR NEGATIVE NEGATIVE Final    Comment: (NOTE) SARS-CoV-2 target nucleic acids are NOT  DETECTED.  The SARS-CoV-2 RNA is generally detectable in upper respiratory specimens during the acute phase of infection. The lowest concentration of SARS-CoV-2 viral copies this assay can detect is 138 copies/mL. A negative result does not preclude SARS-Cov-2 infection and should not be used as the sole basis for treatment or other patient management decisions. A negative result may occur with  improper specimen collection/handling, submission of specimen other than nasopharyngeal swab, presence of viral mutation(s) within the areas targeted by this assay, and inadequate number of viral copies(<138 copies/mL). A negative result must be combined with clinical observations, patient history, and epidemiological information. The expected result is Negative.  Fact Sheet for Patients:  EntrepreneurPulse.com.au  Fact Sheet for Healthcare Providers:  IncredibleEmployment.be  This test is no t yet approved or cleared by the [place] FDA and  has been authorized for detection and/or diagnosis of SARS-CoV-2 by FDA under an Emergency Use Authorization (EUA). This EUA will remain  in effect (meaning this test can be used) for the duration of the COVID-19 declaration under Section 564(b)(1) of the Act, 21 U.S.C.section 360bbb-3(b)(1), unless the authorization is terminated  or revoked sooner.       Influenza A by PCR NEGATIVE NEGATIVE Final   Influenza B by PCR NEGATIVE NEGATIVE Final    Comment: (NOTE) The Xpert Xpress SARS-CoV-2/FLU/RSV plus assay is intended as an aid in the diagnosis of influenza from Nasopharyngeal swab specimens and should not be used as a sole basis for treatment. Nasal washings and aspirates are unacceptable for Xpert Xpress SARS-CoV-2/FLU/RSV testing.  Fact Sheet for Patients: EntrepreneurPulse.com.au  Fact Sheet for Healthcare Providers: IncredibleEmployment.be  This test is not yet  approved or cleared by the [place] FDA and has been  authorized for detection and/or diagnosis of SARS-CoV-2 by FDA under an Emergency Use Authorization (EUA). This EUA will remain in effect (meaning this test can be used) for the duration of the COVID-19 declaration under Section 564(b)(1) of the Act, 21 U.S.C. section 360bbb-3(b)(1), unless the authorization is terminated or revoked.  Performed at [facility], [place], [place] [address]   Aerobic Culture w Gram Stain (superficial specimen)     Status: None   Collection Time: 01/02/21  2:15 PM   Specimen: Wound  Result Value Ref Range Status   Specimen Description   Final    WOUND Performed at [facility], [address] [place], [place] [address]    Special Requests   Final    LL Performed at [facility], [address] [place], [place] [address]    Gram Stain   Final    FEW WBC PRESENT, PREDOMINANTLY PMN NO ORGANISMS SEEN Performed at [facility], [place] [address] [place], [place] [address]    Culture RARE METHICILLIN RESISTANT STAPHYLOCOCCUS AUREUS  Final   Report Status 01/05/2021 FINAL  Final   Organism ID, Bacteria METHICILLIN RESISTANT STAPHYLOCOCCUS AUREUS  Final      Susceptibility   Methicillin resistant staphylococcus aureus - MIC*    CIPROFLOXACIN >=8 RESISTANT Resistant     ERYTHROMYCIN >=8 RESISTANT Resistant     GENTAMICIN <=0.5 SENSITIVE Sensitive     OXACILLIN >=4 RESISTANT Resistant     TETRACYCLINE <=1 SENSITIVE Sensitive     VANCOMYCIN 1 SENSITIVE Sensitive     TRIMETH/SULFA >=320 RESISTANT Resistant     CLINDAMYCIN >=8 RESISTANT Resistant     RIFAMPIN <=0.5 SENSITIVE Sensitive     Inducible Clindamycin NEGATIVE Sensitive     * RARE METHICILLIN RESISTANT STAPHYLOCOCCUS AUREUS  Aerobic/Anaerobic Culture w Gram Stain (surgical/deep wound)     Status: None (Preliminary result)   Collection Time: 01/04/21  2:37 PM   Specimen: PATH Other;  Tissue  Result Value Ref Range Status   Specimen Description   Final    ABSCESS LEFT LEG Performed at [facility], [address], [place], [place] [address]    Special Requests   Final    LEFT LEG Performed at [facility], [address] [place], [place] [address]    Gram Stain   Final    FEW WBC PRESENT, PREDOMINANTLY PMN ABUNDANT GRAM POSITIVE COCCI IN PAIRS    Culture   Final    NO GROWTH 3 DAYS NO ANAEROBES ISOLATED; CULTURE IN PROGRESS FOR 5 DAYS Performed at [place] [address], [place], [place] [address]    Report Status PENDING  Incomplete  Aerobic/Anaerobic Culture w Gram Stain (surgical/deep wound)     Status: None   Collection Time: 01/04/21  2:38 PM   Specimen: PATH Other; Tissue  Result Value Ref Range Status   Specimen Description   Final    ABSCESS LEFT LEG Performed at [facility], [place] [address], [place], [place] [address]    Special Requests   Final    LEFT LEG Performed at [facility], [address] [place], [place] [address]    Gram Stain   Final    FEW WBC PRESENT, PREDOMINANTLY PMN ABUNDANT GRAM POSITIVE COCCI IN PAIRS    Culture   Final    RARE GROUP A STREP (S.PYOGENES) ISOLATED Beta hemolytic streptococci are predictably susceptible to penicillin and other beta lactams. Susceptibility testing  not routinely performed. Performed at [facility], [place] [address], [place], [place] [address]    Report Status 01/07/2021 FINAL  Final     Time coordinating discharge: Over 30 minutes  SIGNED:   Wyvonnia Dusky, MD  [facility] 01/07/2021, 2:31 PM Pager   If 7PM-7AM, please contact night-coverage [email or web address]

## 2021-01-07 NOTE — Progress Notes (Signed)
Patient refusing home wound vac. Surgical team advised wet to dry dressing with education for patient to change dressings at home. Bedside RN demonstrated steps of proper wound cleaning and dressing. Extra wound supplies gathered for patient to take home. Patient states she understands the process and has no questions at this time.   Fuller Mandril, RN
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
## 2021-01-07 NOTE — Progress Notes (Signed)
 Vein & Vascular Surgery Daily Progress Note  Subjective: 01/06/21 1. Ultrasound guidance for vascular access right femoral artery 2. Catheter placement into left SFA from right femoral approach 3. Aortogram and selective left lower extremity angiogram 4. StarClose closure device right femoral artery  Patient without complaint this AM. No acute issues overnight.  Denies any groin or lower extremity discomfort.  Objective: Vitals:   01/06/21 2349 01/07/21 0118 01/07/21 0334 01/07/21 0859  BP: 94/61 (!) 86/57 137/84   Pulse: 83 77 85 86  Resp:  20 20 19   Temp:  97.7 F (36.5 C) 97.9 F (36.6 C) 98 F (36.7 C)  TempSrc:  Oral Oral Oral  SpO2:  93% 95% 96%  Weight:      Height:        Intake/Output Summary (Last 24 hours) at 01/07/2021 1257 Last data filed at 01/07/2021 0944 Gross per 24 hour  Intake 2636.31 ml  Output 0 ml  Net 2636.31 ml   Physical Exam: A&Ox3, NAD CV: RRR Pulmonary: CTA Bilaterally Abdomen: Soft, Nontender, Nondistended Right groin:  Access site: Clean dry and intact Vascular:  Left lower extremity: Thigh soft.  Calf soft.  Extremities warm distally toes.  Good capillary refill.  Motor/sensory is intact.  There is no acute vascular compromise noted to extremity at this time.   Laboratory: CBC    Component Value Date/Time   WBC 9.4 01/07/2021 0323   HGB 8.8 (L) 01/07/2021 0323   HGB 14.8 09/04/2015 1130   HCT 26.7 (L) 01/07/2021 0323   HCT 43.5 09/04/2015 1130   PLT 613 (H) 01/07/2021 0323   PLT 286 09/04/2015 1130   BMET    Component Value Date/Time   NA 137 01/07/2021 0323   NA 141 07/27/2017 1319   NA 142 06/13/2014 2036   K 4.0 01/07/2021 0323   K 4.2 06/13/2014 2036   CL 107 01/07/2021 0323   CL 110 (H) 06/13/2014 2036   CO2 21 (L) 01/07/2021 0323   CO2 27 06/13/2014 2036   GLUCOSE 152 (H) 01/07/2021 0323   GLUCOSE 114 (H) 06/13/2014 2036   BUN 18 01/07/2021 0323   BUN  14 07/27/2017 1319   BUN 8 06/13/2014 2036   CREATININE 0.71 01/07/2021 0323   CREATININE 0.99 04/24/2016 1043   CALCIUM 8.2 (L) 01/07/2021 0323   CALCIUM 9.5 06/13/2014 2036   GFRNONAA >60 01/07/2021 0323   GFRNONAA 63 04/24/2016 1043   GFRAA >60 11/13/2019 1943   GFRAA 73 04/24/2016 1043   Assessment/Planning: The patient is a 60 year old female with multiple medical issues including 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 Patient with three-vessel runoff.  2) No further commendations from vascular surgery at this time.   3) Patient does not need to follow-up.  Discussed with Dr. Ellis Parents Breniya Goertzen PA-C 01/07/2021 12:57 PM

## 2021-01-07 NOTE — Progress Notes (Signed)
Mobility Specialist - Progress Note   01/07/21 1100  Mobility  Activity Ambulated in hall  Level of Assistance Independent  Assistive Device None  Distance Ambulated (ft) 300 ft  Mobility Ambulated independently in hallway  Mobility Response Tolerated well  Mobility performed by Mobility specialist  $Mobility charge 1 Mobility    Pt ambulated in hallway without AD. No LOB. Independent. Pt reports feeling at/close to baseline.    Kathee Delton Mobility Specialist 01/07/21, 11:24 AM

## 2021-01-07 NOTE — Discharge Instructions (Signed)
In addition to included general post-operative instructions,  Wound care: Pack wound with saline moistened gauze daily, cover with dry gauze, and secure with tape. This needs to be done daily. Okay to remove dressing to shower. No swimming or bathing.   Call office [phone] / [phone]) at any time if any questions, worsening pain, fevers/chills, bleeding, drainage from incision site, or other concerns.

## 2021-01-07 NOTE — Progress Notes (Signed)
Jamie Schultz to be D/C'd Home per MD order.  Discussed prescriptions and follow up appointments with the patient. Prescriptions given to patient, medication list explained in detail. Pt verbalized understanding.  Allergies as of 01/07/2021      Reactions   Bee Venom Anaphylaxis, Other (See Comments)   Bees/wasps/yellow jackets   Keflex [cephalexin] Anaphylaxis   Penicillins Anaphylaxis, Other (See Comments)   Has patient had a PCN reaction causing immediate rash, facial/tongue/throat swelling, SOB or lightheadedness with hypotension: Yes Has patient had a PCN reaction causing severe rash involving mucus membranes or skin necrosis: No Has patient had a PCN reaction that required hospitalization Yes, in the hospital already Has patient had a PCN reaction occurring within the last 10 years: Yes If all of the above answers are "NO", then may proceed with Cephalosporin use.   Sulfa Antibiotics Other (See Comments)   Renal failure   Hydrocodone Rash, Other (See Comments)   "I couldn't get my breath"   Ibuprofen Other (See Comments)   Vomiting blood   Contrast Media [iodinated Diagnostic Agents] Other (See Comments)   Patient states she had a previous reaction to iodinated contrast media agents. Premedicate.   Nsaids Itching   Codeine Itching, Rash   Cyclobenzaprine Other (See Comments)   Severe constipation   Fentanyl Nausea And Vomiting, Other (See Comments)   Severe vomiting As of 01/06/21 pt states she is not allergic   Methadone Other (See Comments)   Change in mental status   Neurontin [gabapentin] Hives   Tape Itching, Rash, Other (See Comments)   Blisters skin, Please use "paper" tape   Tegretol [carbamazepine] Hives, Rash   Toradol [ketorolac Tromethamine] Hives, Rash   Tramadol Hives, Rash      Medication List    TAKE these medications   ALPRAZolam 1 MG tablet Commonly known as: XANAX Take 1 mg by mouth 4 (four) times daily.   amphetamine-dextroamphetamine 20 MG  tablet Commonly known as: ADDERALL Take 20 mg by mouth 2 (two) times daily.   Belsomra 20 MG Tabs Generic drug: Suvorexant Take 20 mg by mouth at bedtime.   carisoprodol 350 MG tablet Commonly known as: SOMA Take 350 mg by mouth 2 (two) times daily as needed.   diazepam 5 MG tablet Commonly known as: VALIUM Take 5 mg by mouth 3 (three) times daily as needed (spasms.).   Doxycycline Monohydrate 150 MG Caps Take 1 capsule (150 mg total) by mouth 2 (two) times daily for 21 days.   Dulera 200-5 MCG/ACT Aero Generic drug: mometasone-formoterol TAKE 2 PUFFS BY MOUTH TWICE A DAY What changed: See the new instructions.   DULoxetine 60 MG capsule Commonly known as: CYMBALTA Take 60 mg by mouth daily after lunch.   EpiPen 2-Pak 0.3 mg/0.3 mL Soaj injection Generic drug: EPINEPHrine USE AS DIRECTED FOR ANAPYLACTIC REACTION (TO BEE STINGS)   hydrOXYzine 10 MG tablet Commonly known as: ATARAX/VISTARIL Take 10 mg by mouth 3 (three) times daily as needed for itching.   levothyroxine 50 MCG tablet Commonly known as: SYNTHROID Take 50 mcg by mouth daily.   linaclotide 72 MCG capsule Commonly known as: LINZESS Take 72 mcg by mouth 2 (two) times daily.   Lyrica 100 MG capsule Generic drug: pregabalin TAKE 1 TABLET BY MOUTH 3 TIMES A DAY What changed:   how much to take  how to take this   metoCLOPramide 5 MG tablet Commonly known as: Reglan Take 1 tablet (5 mg total) by mouth 3 (three) times  daily before meals.   metroNIDAZOLE 500 MG tablet Commonly known as: Flagyl Take 1 tablet (500 mg total) by mouth 3 (three) times daily for 7 days.   omeprazole 40 MG capsule Commonly known as: PRILOSEC Take 1 capsule by mouth daily.   ondansetron 4 MG tablet Commonly known as: ZOFRAN Take 4 mg by mouth every 8 (eight) hours as needed for nausea or vomiting.   oxyCODONE 15 MG immediate release tablet Commonly known as: ROXICODONE Take 1 tablet (15 mg total) by mouth every 6  (six) hours as needed for up to 5 days. What changed:   medication strength  how much to take  when to take this  reasons to take this  Another medication with the same name was removed. Continue taking this medication, and follow the directions you see here.   pravastatin 20 MG tablet Commonly known as: PRAVACHOL Take 20 mg by mouth daily.   prazosin 1 MG capsule Commonly known as: MINIPRESS Take 1 mg by mouth at bedtime.   ProAir HFA 108 (90 Base) MCG/ACT inhaler Generic drug: albuterol TAKE 2 PUFFS BY MOUTH EVERY 6 HOURS AS NEEDED FOR WHEEZE OR SHORTNESS OF BREATH What changed: See the new instructions.   QUEtiapine 400 MG tablet Commonly known as: SEROQUEL Take 800 mg by mouth at bedtime.   rOPINIRole 2 MG 24 hr tablet Commonly known as: REQUIP XL Take 2 mg by mouth at bedtime.   temazepam 15 MG capsule Commonly known as: RESTORIL Take 15 mg by mouth at bedtime as needed.   Vitamin D (Ergocalciferol) 1.25 MG (50000 UNIT) Caps capsule Commonly known as: DRISDOL Take 1 capsule by mouth once a week.   ziprasidone 60 MG capsule Commonly known as: GEODON SMARTSIG:1 Capsule(s) By Mouth Every Evening            Discharge Care Instructions  (From admission, onward)         Start     Ordered   01/07/21 0000  Discharge wound care:       Comments: wet-to-dry dressing changes   01/07/21 1219          Vitals:   01/07/21 0334 01/07/21 0859  BP: 137/84   Pulse: 85 86  Resp: 20 19  Temp: 97.9 F (36.6 C) 98 F (36.7 C)  SpO2: 95% 96%    Skin clean, dry and intact without evidence of skin break down, no evidence of skin tears noted. IV catheter discontinued intact. Site without signs and symptoms of complications. Dressing and pressure applied. Pt denies pain at this time. No complaints noted.  An After Visit Summary was printed and given to the patient. Patient escorted via [place], and D/C home via private auto.  Fuller Mandril, RN

## 2021-01-07 NOTE — Plan of Care (Signed)
  Problem: Health Behavior/Discharge Planning: Goal: Ability to manage health-related needs will improve Outcome: Progressing   Problem: Clinical Measurements: Goal: Respiratory complications will improve Outcome: Progressing   Problem: Activity: Goal: Risk for activity intolerance will decrease Outcome: Progressing   Problem: Nutrition: Goal: Adequate nutrition will be maintained Outcome: Progressing   Problem: Coping: Goal: Level of anxiety will decrease Outcome: Progressing   Problem: Safety: Goal: Ability to remain free from injury will improve Outcome: Progressing   Problem: Skin Integrity: Goal: Risk for impaired skin integrity will decrease Outcome: Progressing

## 2021-01-07 NOTE — Progress Notes (Addendum)
[facility] Day(s): 6.   Post op day(s): 1 Day Post-Op.   Interval History: Patient seen and examined, no acute events or new complaints overnight. Patient reports she is feeling better. She is incredibly anxious to go home. Her pain, redness, and swelling continue to improve. She did remove her wound vac this morning.   Vital signs in last 24 hours: [min-max] current  Temp:  [97.7 F (36.5 C)-99.2 F (37.3 C)] 97.9 F (36.6 C) (05/10 0334) Pulse Rate:  [72-90] 85 (05/10 0334) Resp:  [14-20] 20 (05/10 0334) BP: (80-161)/(57-99) 137/84 (05/10 0334) SpO2:  [90 %-98 %] 95 % (05/10 0334)     Height: 5' (152.4 cm) Weight: 51.3 kg BMI (Calculated): 22.07   Intake/Output last 2 shifts:  05/09 0701 - 05/10 0700 In: 6962 [P.O.:720; I.V.:356.7; IV Piggyback:254.3] Out: 125 [Drains:125]   Physical Exam:  Constitutional: alert, cooperative and no distress  Respiratory: breathing non-labored at rest  Cardiovascular: regular rate and sinus rhythm  Integumentary: Wound vac disconnected from left lower lateral leg, surrounding erythema and edema improved, tenderness improved   Labs:  CBC Latest Ref Rng & Units 01/07/2021 01/06/2021 01/05/2021  WBC 4.0 - 10.5 K/uL 9.4 9.3 15.7(H)  Hemoglobin 12.0 - 15.0 g/dL 8.8(L) 9.9(L) 8.1(L)  Hematocrit 36.0 - 46.0 % 26.7(L) 31.9(L) 24.8(L)  Platelets 150 - 400 K/uL 613(H) 580(H) 529(H)   CMP Latest Ref Rng & Units 01/07/2021 01/06/2021 01/05/2021  Glucose 70 - 99 mg/dL 152(H) 113(H) 150(H)  BUN 8 - 23 mg/dL 18 13 20   Creatinine 0.44 - 1.00 mg/dL 0.71 0.68 0.80  Sodium 135 - 145 mmol/L 137 136 138  Potassium 3.5 - 5.1 mmol/L 4.0 4.2 4.1  Chloride 98 - 111 mmol/L 107 105 106  CO2 22 - 32 mmol/L 21(L) 23 26  Calcium 8.9 - 10.3 mg/dL 8.2(L) 8.4(L) 8.1(L)  Total Protein 6.5 - 8.1 g/dL - - -  Total Bilirubin 0.3 - 1.2 mg/dL - - -  Alkaline Phos 38 - 126 U/L - - -  AST 15 - 41 U/L - - -  ALT 0 - 44 U/L - - -      Imaging studies: No new pertinent imaging studies   Assessment/Plan:  60 y.o. female 1 Day Post-Op s/p incision and drainage with wound vac placement for left leg abscess with possible soft tissue necrosis   - She is extremely adamant about going home today and is willing to go home with wet-to-dry dressing changes. 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 RN staff comfortable with change and teaching.   - Continue IV ABx here; transition to PO for home; ID following  - Okay to ambulate as tolerated             - Further management per primary team      - Discharge Planning: okay for discharge from surgical standpoint, patient willing to do wet to dry dressing changes or home, Abx per ID. I will be happy to se her in 2-3 weeks for wound check.    All of the above findings and recommendations were discussed with the patient, and the medical team, and all of patient's questions were answered to her expressed satisfaction.  -- Edison Simon, PA-C [facility] 01/07/2021, 7:31 AM [phone] M-F: 7am - 4pm  The patient had departed the facility prior to my evaluation. I discussed her care with Mr. Olean Ree and concur with the above documentation.

## 2021-01-09 LAB — AEROBIC/ANAEROBIC CULTURE W GRAM STAIN (SURGICAL/DEEP WOUND): Culture: NO GROWTH

## 2021-01-10 ENCOUNTER — Encounter: Payer: Self-pay | Admitting: Physician Assistant

## 2021-01-13 ENCOUNTER — Telehealth: Payer: Self-pay | Admitting: Surgery

## 2021-01-13 NOTE — Telephone Encounter (Signed)
Phone # has been updated after making direct contact w/the pt regarding her appt w/Zach, PA on 01/28/21.  The pt is aware that [place] IN ANY PAIN MEDICATION due to her existing controlled substance contract w/pain management.  The hospitalist, Dr. Jimmye Norman, sent the rx for pain medication to [facility] & not [facility] but he is not able to send anything @ this time regarding pain management.  Plz contact the pt @ [phone] to discuss further.  Thank you

## 2021-01-13 NOTE — Telephone Encounter (Signed)
Left message to return call to office regarding message she left earlier.

## 2021-01-13 NOTE — Telephone Encounter (Signed)
Left message for patient to return call to office. 

## 2021-01-13 NOTE — Telephone Encounter (Signed)
Patient is calling and is asking if one of the nurses would give her a call, patient has some questions and concerns said her urine was a brown color. Patient had incision and drainage abscess done on 01/04/21 with Dr. Dahlia Byes. Please call patient and advise.

## 2021-01-22 DIAGNOSIS — Z96653 Presence of artificial knee joint, bilateral: Secondary | ICD-10-CM | POA: Diagnosis not present

## 2021-01-22 DIAGNOSIS — E039 Hypothyroidism, unspecified: Secondary | ICD-10-CM | POA: Insufficient documentation

## 2021-01-22 DIAGNOSIS — Z96612 Presence of left artificial shoulder joint: Secondary | ICD-10-CM | POA: Diagnosis not present

## 2021-01-22 DIAGNOSIS — J449 Chronic obstructive pulmonary disease, unspecified: Secondary | ICD-10-CM | POA: Diagnosis not present

## 2021-01-22 DIAGNOSIS — Z87891 Personal history of nicotine dependence: Secondary | ICD-10-CM | POA: Insufficient documentation

## 2021-01-22 DIAGNOSIS — L7622 Postprocedural hemorrhage and hematoma of skin and subcutaneous tissue following other procedure: Secondary | ICD-10-CM | POA: Diagnosis present

## 2021-01-22 DIAGNOSIS — Z79899 Other long term (current) drug therapy: Secondary | ICD-10-CM | POA: Diagnosis not present

## 2021-01-22 DIAGNOSIS — Z85038 Personal history of other malignant neoplasm of large intestine: Secondary | ICD-10-CM | POA: Insufficient documentation

## 2021-01-23 ENCOUNTER — Emergency Department
Admission: EM | Admit: 2021-01-23 | Discharge: 2021-01-23 | Discharge status: Against Medical Advice | Payer: Medicaid Other | Attending: Emergency Medicine | Admitting: Emergency Medicine

## 2021-01-23 ENCOUNTER — Other Ambulatory Visit: Payer: Self-pay

## 2021-01-23 DIAGNOSIS — L089 Local infection of the skin and subcutaneous tissue, unspecified: Secondary | ICD-10-CM

## 2021-01-23 LAB — CBC WITH DIFFERENTIAL/PLATELET
Abs Immature Granulocytes: 0.03 10*3/uL (ref 0.00–0.07)
Basophils Absolute: 0.1 10*3/uL (ref 0.0–0.1)
Basophils Relative: 1 %
Eosinophils Absolute: 0.3 10*3/uL (ref 0.0–0.5)
Eosinophils Relative: 4 %
HCT: 38.3 % (ref 36.0–46.0)
Hemoglobin: 12.2 g/dL (ref 12.0–15.0)
Immature Granulocytes: 0 %
Lymphocytes Relative: 33 %
Lymphs Abs: 2.5 10*3/uL (ref 0.7–4.0)
MCH: 30 pg (ref 26.0–34.0)
MCHC: 31.9 g/dL (ref 30.0–36.0)
MCV: 94.1 fL (ref 80.0–100.0)
Monocytes Absolute: 0.8 10*3/uL (ref 0.1–1.0)
Monocytes Relative: 10 %
Neutro Abs: 4 10*3/uL (ref 1.7–7.7)
Neutrophils Relative %: 52 %
Platelets: 377 10*3/uL (ref 150–400)
RBC: 4.07 MIL/uL (ref 3.87–5.11)
RDW: 15.5 % (ref 11.5–15.5)
WBC: 7.6 10*3/uL (ref 4.0–10.5)
nRBC: 0 % (ref 0.0–0.2)

## 2021-01-23 LAB — COMPREHENSIVE METABOLIC PANEL
ALT: 14 U/L (ref 0–44)
AST: 20 U/L (ref 15–41)
Albumin: 3.5 g/dL (ref 3.5–5.0)
Alkaline Phosphatase: 97 U/L (ref 38–126)
Anion gap: 9 (ref 5–15)
BUN: 12 mg/dL (ref 8–23)
CO2: 25 mmol/L (ref 22–32)
Calcium: 9.3 mg/dL (ref 8.9–10.3)
Chloride: 104 mmol/L (ref 98–111)
Creatinine, Ser: 0.9 mg/dL (ref 0.44–1.00)
GFR, Estimated: >60 mL/min (ref >60)
Glucose, Bld: 113 mg/dL — ABNORMAL HIGH (ref 70–99)
Potassium: 4.4 mmol/L (ref 3.5–5.1)
Sodium: 138 mmol/L (ref 135–145)
Total Bilirubin: 0.4 mg/dL (ref 0.3–1.2)
Total Protein: 7.3 g/dL (ref 6.5–8.1)

## 2021-01-23 MED ORDER — HYDROMORPHONE HCL 1 MG/ML IJ SOLN
1.0000 mg | Freq: Once | INTRAMUSCULAR | Status: AC
  Administered 2021-01-23: 1 mg via INTRAVENOUS
  Filled 2021-01-23: qty 1
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
## 2021-01-23 NOTE — ED Notes (Addendum)
Wet to dry dressing applied to left leg wound per Dr Jari Pigg.  Pt walking with unsteady gait, falling asleep quickly. Dr Jari Pigg reassessed before d/c, ok for d/c, reports at baseline. Refused d/c vitals Pt understands leaving AMA.  Walked to ride for discharge.

## 2021-01-23 NOTE — Discharge Instructions (Addendum)
We recommended a repeat MRI to make sure things are healing well but you have opted to want to leave.  Should call Dr. Dahlia Byes office and see if he get seen earlier and return to the ER if your symptoms are worsening, fevers or any other concerns

## 2021-01-23 NOTE — ED Triage Notes (Addendum)
Pt arrived via [facility] from home with reports of L lateral ankle wound. Pt is s/p I&D of wound from infected tattoo earlier this month. Pt states she is currently taking doxycycline. Pt c/o pain to the area and also c/o redness and swelling.  Pt due to follow up with Dr. Dahlia Byes on 5/31.  Pt states she has not missed any doses of atbx.   Wound is packed at this time, I re-wrapped with gauze in triage.   Pt taking oxycodone 10mg  at home last dose around 830pm. Pt states the pain meds aren't helping.

## 2021-01-28 ENCOUNTER — Encounter: Payer: Medicaid Other | Admitting: Physician Assistant

## 2021-01-29 ENCOUNTER — Encounter: Payer: Self-pay | Admitting: Surgery

## 2021-01-29 ENCOUNTER — Ambulatory Visit (INDEPENDENT_AMBULATORY_CARE_PROVIDER_SITE_OTHER): Payer: Medicaid Other | Admitting: Surgery

## 2021-01-29 ENCOUNTER — Other Ambulatory Visit: Payer: Self-pay

## 2021-01-29 VITALS — BP 120/83 | HR 94 | Temp 98.4°F | Ht 60.0 in | Wt 109.6 lb

## 2021-01-29 DIAGNOSIS — Z09 Encounter for follow-up examination after completed treatment for conditions other than malignant neoplasm: Secondary | ICD-10-CM

## 2021-01-29 MED ORDER — CIPROFLOXACIN HCL 500 MG PO TABS: 500.0000 mg | ORAL_TABLET | Freq: Two times a day (BID) | ORAL | 0 refills | Status: AC

## 2021-01-29 MED ORDER — KETOROLAC TROMETHAMINE 10 MG PO TABS: 10.0000 mg | ORAL_TABLET | Freq: Four times a day (QID) | ORAL | 0 refills | Status: DC | PRN

## 2021-01-29 NOTE — Progress Notes (Signed)
S/p debridement left leg Non compliance and chronic use of narcotics Recently had ER visit and Left AMA She has been changing the dressing q  3 days or 2 days at best. No fever or chills  PE:  LE: there is some edema ulcer measured 9 x 3 cms, good granulation tissue. She had an old bandage that I replaced with wet/dry and kerlix  A/ pDoing ok but I am concerned about her compliance. I will add cipro for 2 weeks I will not prescribe any narcotics but will do short course of Toradol PO We will see her 2-3 weeks Reinforced her about the importance of dressing changes daily.

## 2021-01-29 NOTE — Patient Instructions (Addendum)
Change your dressing once a day. Use plan gauze, not non stick.   We are going to send you in a prescription for Cipro antibiotic.  We will give you a prescription for Toradol.  Follow up in 2 weeks.   You may shower after removing your dressing. Rinse well and pat dry and redress the area.

## 2021-02-03 ENCOUNTER — Encounter: Payer: Medicaid Other | Admitting: Physician Assistant

## 2021-02-10 ENCOUNTER — Encounter: Payer: Medicaid Other | Admitting: Surgery

## 2021-06-07 ENCOUNTER — Other Ambulatory Visit: Payer: Self-pay

## 2021-06-07 ENCOUNTER — Emergency Department
Admission: EM | Admit: 2021-06-07 | Discharge: 2021-06-07 | Disposition: A | Discharge status: Home / Self Care | Payer: Medicaid Other | Attending: Emergency Medicine | Admitting: Emergency Medicine

## 2021-06-07 ENCOUNTER — Emergency Department: Payer: Medicaid Other

## 2021-06-07 DIAGNOSIS — Z96653 Presence of artificial knee joint, bilateral: Secondary | ICD-10-CM | POA: Diagnosis not present

## 2021-06-07 DIAGNOSIS — T424X4A Poisoning by benzodiazepines, undetermined, initial encounter: Secondary | ICD-10-CM | POA: Insufficient documentation

## 2021-06-07 DIAGNOSIS — Z79899 Other long term (current) drug therapy: Secondary | ICD-10-CM | POA: Diagnosis not present

## 2021-06-07 DIAGNOSIS — Z87891 Personal history of nicotine dependence: Secondary | ICD-10-CM | POA: Diagnosis not present

## 2021-06-07 DIAGNOSIS — E039 Hypothyroidism, unspecified: Secondary | ICD-10-CM | POA: Diagnosis not present

## 2021-06-07 DIAGNOSIS — J449 Chronic obstructive pulmonary disease, unspecified: Secondary | ICD-10-CM | POA: Diagnosis not present

## 2021-06-07 DIAGNOSIS — T50901A Poisoning by unspecified drugs, medicaments and biological substances, accidental (unintentional), initial encounter: Secondary | ICD-10-CM

## 2021-06-07 LAB — CBC WITH DIFFERENTIAL/PLATELET
Abs Immature Granulocytes: 0.24 10*3/uL — ABNORMAL HIGH (ref 0.00–0.07)
Basophils Absolute: 0.1 10*3/uL (ref 0.0–0.1)
Basophils Relative: 1 %
Eosinophils Absolute: 0.5 10*3/uL (ref 0.0–0.5)
Eosinophils Relative: 4 %
HCT: 33.1 % — ABNORMAL LOW (ref 36.0–46.0)
Hemoglobin: 10.8 g/dL — ABNORMAL LOW (ref 12.0–15.0)
Immature Granulocytes: 2 %
Lymphocytes Relative: 23 %
Lymphs Abs: 2.8 10*3/uL (ref 0.7–4.0)
MCH: 29 pg (ref 26.0–34.0)
MCHC: 32.6 g/dL (ref 30.0–36.0)
MCV: 88.7 fL (ref 80.0–100.0)
Monocytes Absolute: 0.7 10*3/uL (ref 0.1–1.0)
Monocytes Relative: 6 %
Neutro Abs: 7.8 10*3/uL — ABNORMAL HIGH (ref 1.7–7.7)
Neutrophils Relative %: 64 %
Platelets: 472 10*3/uL — ABNORMAL HIGH (ref 150–400)
RBC: 3.73 MIL/uL — ABNORMAL LOW (ref 3.87–5.11)
RDW: 17.5 % — ABNORMAL HIGH (ref 11.5–15.5)
WBC: 12.1 10*3/uL — ABNORMAL HIGH (ref 4.0–10.5)
nRBC: 0 % (ref 0.0–0.2)

## 2021-06-07 LAB — URINE DRUG SCREEN, QUALITATIVE (ARMC ONLY)
Amphetamines, Ur Screen: POSITIVE — AB
Barbiturates, Ur Screen: NOT DETECTED
Benzodiazepine, Ur Scrn: POSITIVE — AB
Cannabinoid 50 Ng, Ur ~~LOC~~: NOT DETECTED
Cocaine Metabolite,Ur ~~LOC~~: NOT DETECTED
MDMA (Ecstasy)Ur Screen: NOT DETECTED
Methadone Scn, Ur: NOT DETECTED
Opiate, Ur Screen: NOT DETECTED
Phencyclidine (PCP) Ur S: NOT DETECTED
Tricyclic, Ur Screen: POSITIVE — AB

## 2021-06-07 LAB — COMPREHENSIVE METABOLIC PANEL
ALT: 20 U/L (ref 0–44)
AST: 20 U/L (ref 15–41)
Albumin: 3.4 g/dL — ABNORMAL LOW (ref 3.5–5.0)
Alkaline Phosphatase: 98 U/L (ref 38–126)
Anion gap: 11 (ref 5–15)
BUN: 16 mg/dL (ref 8–23)
CO2: 29 mmol/L (ref 22–32)
Calcium: 9.2 mg/dL (ref 8.9–10.3)
Chloride: 95 mmol/L — ABNORMAL LOW (ref 98–111)
Creatinine, Ser: 0.82 mg/dL (ref 0.44–1.00)
GFR, Estimated: >60 mL/min (ref >60)
Glucose, Bld: 174 mg/dL — ABNORMAL HIGH (ref 70–99)
Potassium: 3.6 mmol/L (ref 3.5–5.1)
Sodium: 135 mmol/L (ref 135–145)
Total Bilirubin: 0.4 mg/dL (ref 0.3–1.2)
Total Protein: 8.2 g/dL — ABNORMAL HIGH (ref 6.5–8.1)

## 2021-06-07 LAB — ETHANOL: Alcohol, Ethyl (B): <10 mg/dL (ref <10)

## 2021-06-07 MED ORDER — LACTATED RINGERS IV BOLUS
1000.0000 mL | Freq: Once | INTRAVENOUS | Status: AC
  Administered 2021-06-07: 1000 mL via INTRAVENOUS

## 2021-06-07 NOTE — ED Notes (Signed)
Signature pad in room not working- pt unable to sign for discharge

## 2021-06-07 NOTE — ED Notes (Signed)
Pt noted to be responding to internal stimuli.
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
## 2021-06-07 NOTE — ED Triage Notes (Signed)
60 y/o female who was found by family unresponsive. Family started CPR. When EMS arrived she did have a strong radial pulse but was breathing 2 times a minute. 1mg  of Narcan was administered and then a King airway, she removed the [place] airway on her own. A second 1mg  of Narcan was given. She was awake and breathing on her own and placed on 15L NRB thereafter. Family unable to provide more information but EMS brought home medication and 45 tablets of Valium 5mg  was filled on 05/27/21 and bottle is empty. She additionally has a prescription for Temazepam 15mg  with 15 tablets filled on 05/27/21 and 8 tablets remaining.

## 2021-06-07 NOTE — ED Notes (Signed)
Patient is refusing CT head, MD notified and returned to bedside to discuss with patient the need for imaging.

## 2021-06-11 NOTE — TOC Progression Note (Signed)
Transition of Care [facility]) - Progression Note    Patient Details  Name: Jamie Schultz MRN: [id number] Date of Birth: [birth year]  Transition of Care [facility]) CM/SW [place], [place] Phone Number: 06/11/2021, 8:46 AM  Clinical Narrative:     CSW spoke with Milus Glazier  APS/DSS who stated she is unable to find the patient after her discharge from the ED on 06/07/2021.       Expected Discharge Plan and Services                                                 Social Determinants of Health (SDOH) Interventions    Readmission Risk Interventions Readmission Risk Prevention Plan 01/02/2021 10/27/2019  Transportation Screening Complete Complete  PCP or Specialist Appt within 3-5 Days Complete Not Complete  Not Complete comments - Got first available appointment with specialist  [place] or [place] - Complete  Social Work Consult for [place] Planning/Counseling Complete Complete  Palliative Care Screening Not Applicable Not Applicable  Medication Review [employer] photographer) Complete Complete  Some recent data might be hidden

## 2021-09-01 ENCOUNTER — Observation Stay
Admission: EM | Admit: 2021-09-01 | Discharge: 2021-09-01 | Discharge status: Against Medical Advice | Payer: Medicaid Other | Attending: Emergency Medicine | Admitting: Emergency Medicine

## 2021-09-01 ENCOUNTER — Other Ambulatory Visit: Payer: Self-pay

## 2021-09-01 DIAGNOSIS — J439 Emphysema, unspecified: Secondary | ICD-10-CM | POA: Diagnosis not present

## 2021-09-01 DIAGNOSIS — E039 Hypothyroidism, unspecified: Secondary | ICD-10-CM | POA: Diagnosis present

## 2021-09-01 DIAGNOSIS — Z87891 Personal history of nicotine dependence: Secondary | ICD-10-CM | POA: Insufficient documentation

## 2021-09-01 DIAGNOSIS — F988 Other specified behavioral and emotional disorders with onset usually occurring in childhood and adolescence: Secondary | ICD-10-CM | POA: Diagnosis present

## 2021-09-01 DIAGNOSIS — J449 Chronic obstructive pulmonary disease, unspecified: Secondary | ICD-10-CM | POA: Diagnosis not present

## 2021-09-01 DIAGNOSIS — H029 Unspecified disorder of eyelid: Secondary | ICD-10-CM | POA: Diagnosis not present

## 2021-09-01 DIAGNOSIS — S91312A Laceration without foreign body, left foot, initial encounter: Secondary | ICD-10-CM

## 2021-09-01 DIAGNOSIS — L0291 Cutaneous abscess, unspecified: Secondary | ICD-10-CM

## 2021-09-01 DIAGNOSIS — E785 Hyperlipidemia, unspecified: Secondary | ICD-10-CM | POA: Diagnosis present

## 2021-09-01 DIAGNOSIS — H02843 Edema of right eye, unspecified eyelid: Secondary | ICD-10-CM | POA: Diagnosis present

## 2021-09-01 DIAGNOSIS — G894 Chronic pain syndrome: Secondary | ICD-10-CM | POA: Diagnosis present

## 2021-09-01 DIAGNOSIS — L03311 Cellulitis of abdominal wall: Secondary | ICD-10-CM | POA: Diagnosis not present

## 2021-09-01 DIAGNOSIS — L02211 Cutaneous abscess of abdominal wall: Secondary | ICD-10-CM | POA: Diagnosis not present

## 2021-09-01 DIAGNOSIS — F419 Anxiety disorder, unspecified: Secondary | ICD-10-CM | POA: Diagnosis present

## 2021-09-01 DIAGNOSIS — Z20822 Contact with and (suspected) exposure to covid-19: Secondary | ICD-10-CM | POA: Diagnosis not present

## 2021-09-01 DIAGNOSIS — Z96643 Presence of artificial hip joint, bilateral: Secondary | ICD-10-CM | POA: Insufficient documentation

## 2021-09-01 DIAGNOSIS — Z79899 Other long term (current) drug therapy: Secondary | ICD-10-CM | POA: Diagnosis not present

## 2021-09-01 DIAGNOSIS — Z96612 Presence of left artificial shoulder joint: Secondary | ICD-10-CM | POA: Insufficient documentation

## 2021-09-01 LAB — CBC WITH DIFFERENTIAL/PLATELET
Abs Immature Granulocytes: 0.04 10*3/uL (ref 0.00–0.07)
Basophils Absolute: 0.1 10*3/uL (ref 0.0–0.1)
Basophils Relative: 1 %
Eosinophils Absolute: 0.4 10*3/uL (ref 0.0–0.5)
Eosinophils Relative: 4 %
HCT: 34.5 % — ABNORMAL LOW (ref 36.0–46.0)
Hemoglobin: 11.1 g/dL — ABNORMAL LOW (ref 12.0–15.0)
Immature Granulocytes: 0 %
Lymphocytes Relative: 17 %
Lymphs Abs: 1.9 10*3/uL (ref 0.7–4.0)
MCH: 29.8 pg (ref 26.0–34.0)
MCHC: 32.2 g/dL (ref 30.0–36.0)
MCV: 92.7 fL (ref 80.0–100.0)
Monocytes Absolute: 0.8 10*3/uL (ref 0.1–1.0)
Monocytes Relative: 7 %
Neutro Abs: 8.1 10*3/uL — ABNORMAL HIGH (ref 1.7–7.7)
Neutrophils Relative %: 71 %
Platelets: 270 10*3/uL (ref 150–400)
RBC: 3.72 MIL/uL — ABNORMAL LOW (ref 3.87–5.11)
RDW: 16.1 % — ABNORMAL HIGH (ref 11.5–15.5)
WBC: 11.4 10*3/uL — ABNORMAL HIGH (ref 4.0–10.5)
nRBC: 0 % (ref 0.0–0.2)

## 2021-09-01 LAB — COMPREHENSIVE METABOLIC PANEL
ALT: 12 U/L (ref 0–44)
AST: 16 U/L (ref 15–41)
Albumin: 3.4 g/dL — ABNORMAL LOW (ref 3.5–5.0)
Alkaline Phosphatase: 89 U/L (ref 38–126)
Anion gap: 7 (ref 5–15)
BUN: 16 mg/dL (ref 8–23)
CO2: 26 mmol/L (ref 22–32)
Calcium: 9 mg/dL (ref 8.9–10.3)
Chloride: 106 mmol/L (ref 98–111)
Creatinine, Ser: 0.7 mg/dL (ref 0.44–1.00)
GFR, Estimated: >60 mL/min (ref >60)
Glucose, Bld: 79 mg/dL (ref 70–99)
Potassium: 4.1 mmol/L (ref 3.5–5.1)
Sodium: 139 mmol/L (ref 135–145)
Total Bilirubin: 0.5 mg/dL (ref 0.3–1.2)
Total Protein: 7.3 g/dL (ref 6.5–8.1)

## 2021-09-01 LAB — RESP PANEL BY RT-PCR (FLU A&B, COVID) ARPGX2
Influenza A by PCR: NEGATIVE
Influenza B by PCR: NEGATIVE
SARS Coronavirus 2 by RT PCR: NEGATIVE

## 2021-09-01 LAB — LACTIC ACID, PLASMA
Lactic Acid, Venous: 1.5 mmol/L (ref 0.5–1.9)
Lactic Acid, Venous: 1 mmol/L (ref 0.5–1.9)

## 2021-09-01 LAB — SEDIMENTATION RATE: Sed Rate: 42 mm/hr — ABNORMAL HIGH (ref 0–30)

## 2021-09-01 MED ORDER — DOXYCYCLINE HYCLATE 100 MG PO TABS: 100.0000 mg | ORAL_TABLET | Freq: Two times a day (BID) | ORAL | 0 refills | Status: AC

## 2021-09-01 MED ORDER — ONDANSETRON HCL 4 MG/2ML IJ SOLN
4.0000 mg | Freq: Once | INTRAMUSCULAR | Status: AC
Start: 2021-09-01 — End: 2021-09-01
  Administered 2021-09-01: 4 mg via INTRAVENOUS
  Filled 2021-09-01: qty 2

## 2021-09-01 MED ORDER — MORPHINE SULFATE (PF) 4 MG/ML IV SOLN
4.0000 mg | Freq: Once | INTRAVENOUS | Status: AC
  Administered 2021-09-01: 4 mg via INTRAVENOUS
  Filled 2021-09-01: qty 1

## 2021-09-01 MED ORDER — VANCOMYCIN HCL 1250 MG/250ML IV SOLN
1250.0000 mg | Freq: Once | INTRAVENOUS | Status: AC
  Administered 2021-09-01: 1250 mg via INTRAVENOUS
  Filled 2021-09-01: qty 250

## 2021-09-01 MED ORDER — HYDROMORPHONE HCL 1 MG/ML IJ SOLN
1.0000 mg | Freq: Once | INTRAMUSCULAR | Status: AC
  Administered 2021-09-01: 1 mg via INTRAVENOUS
  Filled 2021-09-01: qty 1

## 2021-09-01 MED ORDER — HYDROMORPHONE HCL 1 MG/ML IJ SOLN
1.0000 mg | INTRAMUSCULAR | Status: DC | PRN
  Administered 2021-09-01: 1 mg via INTRAVENOUS
  Filled 2021-09-01: qty 1

## 2021-09-01 MED ORDER — DM-GUAIFENESIN ER 30-600 MG PO TB12: 1.0000 | ORAL_TABLET | Freq: Two times a day (BID) | ORAL | Status: DC | PRN

## 2021-09-01 MED ORDER — LIDOCAINE HCL (PF) 1 % IJ SOLN
5.0000 mL | Freq: Once | INTRAMUSCULAR | Status: AC
  Administered 2021-09-01: 5 mL via INTRADERMAL
  Filled 2021-09-01: qty 5

## 2021-09-01 MED ORDER — VANCOMYCIN HCL 750 MG/150ML IV SOLN: 750.0000 mg | INTRAVENOUS | Status: DC

## 2021-09-01 MED ORDER — ONDANSETRON HCL 4 MG/2ML IJ SOLN: 4.0000 mg | Freq: Three times a day (TID) | INTRAMUSCULAR | Status: DC | PRN

## 2021-09-01 MED ORDER — LIDOCAINE-EPINEPHRINE-TETRACAINE (LET) TOPICAL GEL
3.0000 mL | Freq: Once | TOPICAL | Status: AC
  Administered 2021-09-01: 3 mL via TOPICAL
  Filled 2021-09-01: qty 3

## 2021-09-01 MED ORDER — QUETIAPINE FUMARATE 200 MG PO TABS: 400.0000 mg | ORAL_TABLET | Freq: Every day | ORAL | Status: DC

## 2021-09-01 MED ORDER — ACETAMINOPHEN 325 MG PO TABS: 650.0000 mg | ORAL_TABLET | Freq: Four times a day (QID) | ORAL | Status: DC | PRN

## 2021-09-01 MED ORDER — PREGABALIN 50 MG PO CAPS: 100.0000 mg | ORAL_CAPSULE | Freq: Three times a day (TID) | ORAL | Status: DC

## 2021-09-01 MED ORDER — LIDOCAINE HCL (PF) 1 % IJ SOLN
INTRAMUSCULAR | Status: AC
  Filled 2021-09-01: qty 5

## 2021-09-01 MED ORDER — OXYCODONE HCL 5 MG PO TABS: 5.0000 mg | ORAL_TABLET | Freq: Four times a day (QID) | ORAL | Status: DC | PRN

## 2021-09-01 MED ORDER — ALBUTEROL SULFATE (2.5 MG/3ML) 0.083% IN NEBU: 2.5000 mg | INHALATION_SOLUTION | RESPIRATORY_TRACT | Status: DC | PRN

## 2021-09-01 MED ORDER — DULOXETINE HCL 60 MG PO CPEP: 60.0000 mg | ORAL_CAPSULE | Freq: Two times a day (BID) | ORAL | Status: DC
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
## 2021-09-01 NOTE — ED Provider Notes (Signed)
[facility] Provider Note    Event Date/Time   First MD Initiated Contact with Patient 09/01/21 1153     (approximate)   History   Abscess   HPI  Jamie Schultz is a 61 y.o. female with history of sepsis and surgical incision and drainage of an abscess on the leg presents with new onset of abscess to the right upper quadrant for 4 days, some fever and chills.  Also has a bump on her right upper eyelid, laceration on the plantar surface of her foot which is healing and she states it is not infected.  Patient states she has a history of kidney failure and elevated liver enzymes.      Physical Exam   Triage Vital Signs: ED Triage Vitals [09/01/21 1157]  Enc Vitals Group     BP 125/85     Pulse Rate 85     Resp 18     Temp 98.1 F (36.7 C)     Temp Source Oral     SpO2 100 %     Weight 115 lb (52.2 kg)     Height 5' (1.524 m)     Head Circumference      Peak Flow      Pain Score      Pain Loc      Pain Edu?      Excl. in [place]?     Most recent vital signs: Vitals:   09/01/21 1157  BP: 125/85  Pulse: 85  Resp: 18  Temp: 98.1 F (36.7 C)  SpO2: 100%     General: Awake, no distress.   CV:  Good peripheral perfusion. Resp:  Normal effort.  Lungs CTA Abd:  No distention.  Large abscess noted on the right upper quadrant, measures about 6 to 7 inches x 6 to 7 inches with hard center raised with black center measuring about 2 inches x 2 inches.  No active drainage is noted Other:  Small bump noted on the right upper eyelid medially, laceration on the plantar surface of the right foot is healing and does not appear to be infected   ED Results / Procedures / Treatments   Labs (all labs ordered are listed, but only abnormal results are displayed) Labs Reviewed  CBC WITH DIFFERENTIAL/PLATELET - Abnormal; Notable for the following components:      Result Value   WBC 11.4 (*)    RBC 3.72 (*)    Hemoglobin 11.1 (*)    HCT 34.5 (*)    RDW  16.1 (*)    Neutro Abs 8.1 (*)    All other components within normal limits  COMPREHENSIVE METABOLIC PANEL - Abnormal; Notable for the following components:   Albumin 3.4 (*)    All other components within normal limits  RESP PANEL BY RT-PCR (FLU A&B, COVID) ARPGX2  CULTURE, BLOOD (ROUTINE X 2)  CULTURE, BLOOD (ROUTINE X 2)  AEROBIC CULTURE W GRAM STAIN (SUPERFICIAL SPECIMEN)  LACTIC ACID, PLASMA  LACTIC ACID, PLASMA  SEDIMENTATION RATE  C-REACTIVE PROTEIN     EKG     RADIOLOGY     PROCEDURES:  Critical Care performed: No  Procedures   MEDICATIONS ORDERED IN ED: Medications  vancomycin (VANCOREADY) IVPB 1250 mg/250 mL (1,250 mg Intravenous New Bag/Given 09/01/21 1335)  HYDROmorphone (DILAUDID) injection 1 mg (has no administration in time range)  ondansetron (ZOFRAN) injection 4 mg (has no administration in time range)  acetaminophen (TYLENOL) tablet 650 mg (has no administration  in time range)  albuterol (PROVENTIL) (2.5 MG/3ML) 0.083% nebulizer solution 2.5 mg (has no administration in time range)  dextromethorphan-guaiFENesin (MUCINEX DM) 30-600 MG per 12 hr tablet 1 tablet (has no administration in time range)  vancomycin (VANCOREADY) IVPB 750 mg/150 mL (has no administration in time range)  morphine 4 MG/ML injection 4 mg (4 mg Intravenous Given 09/01/21 1230)  ondansetron (ZOFRAN) injection 4 mg (4 mg Intravenous Given 09/01/21 1230)  lidocaine (PF) (XYLOCAINE) 1 % injection 5 mL (5 mLs Intradermal Given 09/01/21 1327)  lidocaine-EPINEPHrine-tetracaine (LET) topical gel (3 mLs Topical Given 09/01/21 1328)  HYDROmorphone (DILAUDID) injection 1 mg (1 mg Intravenous Given 09/01/21 1327)     IMPRESSION / MDM / ASSESSMENT AND PLAN / ED COURSE  I reviewed the triage vital signs and the nursing notes.                              Differential diagnosis includes, but is not limited to, sepsis, AKI, abscess, cellulitis, MRSA  The patient is a 61 year old female presents  emergency department with history of sepsis, cellulitis which had to be IDed surgically in the OR, AKI, and elevated liver enzymes.  Patient is concerned that she has a worsening abscess to the right upper quadrant. Physical exam does show a large area of cellulitis with a abscess in the center, no drainage is noted  Ordered labs and will do IV antibiotics along with pain medication for the patient.  CBC does show an elevated WBC of 11.4, comprehensive metabolic panel is normal  See procedure note by Dr. Ellender Hose for incision and drainage, aerobic wound culture was ordered  Due to the amount of cellulitis surrounding the abscess and the patient's history of sepsis, Dr. Joellen Jersey I feel that be more important for her to be admitted at least for observation to get IV antibiotics.  Patient is in agreement treatment plan.  She will be admitted.  We did start vancomycin IV for the patient.       FINAL CLINICAL IMPRESSION(S) / ED DIAGNOSES   Final diagnoses:  Abscess  Cellulitis of abdominal wall     Rx / DC Orders   ED Discharge Orders     None        Note:  This document was prepared using Dragon voice recognition software and may include unintentional dictation errors.    Versie Starks, PA-C 09/01/21 1433    Duffy Bruce, MD 09/01/21 1535

## 2021-09-01 NOTE — ED Triage Notes (Signed)
Pt c/o red raised area to the right side of abd for the past 4 days

## 2021-09-01 NOTE — Discharge Summary (Signed)
Physician Discharge Summary  Jamie Schultz RSW:[id number] DOB: Jan 13, 1961 DOA: 09/01/2021  PCP: Simona Huh, NP  Admit date: 09/01/2021 Discharge date: 09/01/2021  Recommendations for Outpatient Follow-up:  -left on [place]: none Equipment/Devices: none  Discharge Condition: stable CODE STATUS: full code Diet recommendation:   Brief/Interim Summary (HPI)  Jamie Schultz is a 61 y.o. female with medical history significant of hyperlipidemia, COPD, GERD, hypothyroidism, anxiety, panic attack, kidney stone, chronic pain syndrome, fibromyalgia, former smoker, history of abscess with drainage, who presents with abdominal wall abscess.   Pt states that she had history of leg abscess requiring surgical I&D.  She states that she noticed a large new abscess in the right upper quadrant abdominal wall, which has been going on for 4 days.  She has pain, which is constant, sharp, moderate, nonradiating. Patient has subjective fever and chills.  Her body temperature is 98.1 in emergency room.  Patient denies chest pain, shortness breath, cough.  No nausea, vomiting, diarrhea or abdominal pain.  No symptoms of UTI. She also has right upper eyelid swelling with erythema. No eye pain or vision change.  She has laceration on the left plantar surface of foot which is healing and she states it is not infected. Patient states that currently she is only taking 3 medications, duloxetine, Lyrica and Seroquel.  She is not taking her thyroid medication and cholesterol medication.   ED Course: pt was found to have WBC 11.4, lactic acid 1.5, negative COVID PCR, GFR > 60, temperature normal, blood pressure 125/85, heart rate 85, RR 18, oxygen saturation 100% on room air.  Patient is placed on MedSurg bed for observation.  ED physician performed I&D of her abdominal wall abscess.   Subjective  - abscess in right upper quadrant abdominal wall  Discharge Diagnoses and Hospital Course:   Principal Problem:    Abscess of abdominal wall Active Problems:   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HCC)   Hyperlipidemia   Hypothyroidism   Chronic pain syndrome   Swelling of eyelid, right   Anxiety   ADD (attention deficit disorder)   Laceration of plantar aspect of foot, left, initial encounter  Abscess of abdominal wall: Patient has a large abscess in right upper quadrant abdominal wall.  ED physician did I&D.  Plan is to continue IV vancomycin and observe patient closely.  If patient does not improve ,will get surgical consult.  Patien strongly wants to go home, I spent a lengthy time with her, and explained the necessity of being treated in the hospital.  She still wants to go home, finally decided to leave hospital AMA.  She fully understand the risk of leaving hospital now, including worsening infection, developing sepsis, septic shock, and even death.  I told her that I will send doxycycline prescription to her pharmacy.  She agreed to pick up doxycycline and take it.  I prescribed 100 mg of doxycycline twice daily for 10 days. I told her to come back to hospital if she gets worse.   -Patient was initially placed on MedSurg bed for position -Started on vancomycin -As needed oxycodone (patient states that she cannot tolerate oxycodone) in hospital -Blood cultures collected   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[place]): Stable -As needed albuterol   Hyperlipidemia: Not taking medications currently -Follow-up with PCP   Hypothyroidism: Patient not taking her Synthroid currently -Follow-up with PCP   Chronic pain syndrome -Continue home Lyrica   Swelling of eyelid, right: Etiology is not clear. -Patient will be on antibiotics   Anxiety  and ADD (attention deficit disorder) -Seroquel, duloxetine   Laceration of plantar aspect of foot, left, initial encounter: No signs of infection. -Self wound care    Discharge Instructions   Allergies as of 09/01/2021       Reactions   Bee Venom Anaphylaxis, Other (See Comments)    Bees/wasps/yellow jackets   Keflex [cephalexin] Anaphylaxis   Penicillins Anaphylaxis, Other (See Comments)   Has patient had a PCN reaction causing immediate rash, facial/tongue/throat swelling, SOB or lightheadedness with hypotension: Yes Has patient had a PCN reaction causing severe rash involving mucus membranes or skin necrosis: No Has patient had a PCN reaction that required hospitalization Yes, in the hospital already Has patient had a PCN reaction occurring within the last 10 years: Yes If all of the above answers are "NO", then may proceed with Cephalosporin use.   Sulfa Antibiotics Other (See Comments)   Renal failure   Hydrocodone Rash, Other (See Comments)   "I couldn't get my breath"   Ibuprofen Other (See Comments)   Vomiting blood   Contrast Media [iodinated Contrast Media] Other (See Comments)   Patient states she had a previous reaction to iodinated contrast media agents. Premedicate.   Nsaids Itching   Codeine Itching, Rash   Cyclobenzaprine Other (See Comments)   Severe constipation   Fentanyl Nausea And Vomiting, Other (See Comments)   Severe vomiting As of 01/06/21 pt states she is not allergic   Methadone Other (See Comments)   Change in mental status   Neurontin [gabapentin] Hives   Tape Itching, Rash, Other (See Comments)   Blisters skin, Please use "paper" tape   Tegretol [carbamazepine] Hives, Rash   Toradol [ketorolac Tromethamine] Hives, Rash   Tramadol Hives, Rash     Med Rec must be completed prior to using this [facility]       Allergies  Allergen Reactions   Bee Venom Anaphylaxis and Other (See Comments)    Bees/wasps/yellow jackets   Keflex [Cephalexin] Anaphylaxis   Penicillins Anaphylaxis and Other (See Comments)    Has patient had a PCN reaction causing immediate rash, facial/tongue/throat swelling, SOB or lightheadedness with hypotension: Yes Has patient had a PCN reaction causing severe rash involving mucus membranes or skin necrosis:  No Has patient had a PCN reaction that required hospitalization Yes, in the hospital already Has patient had a PCN reaction occurring within the last 10 years: Yes If all of the above answers are "NO", then may proceed with Cephalosporin use.    Sulfa Antibiotics Other (See Comments)    Renal failure   Hydrocodone Rash and Other (See Comments)    "I couldn't get my breath"   Ibuprofen Other (See Comments)    Vomiting blood   Contrast Media [Iodinated Contrast Media] Other (See Comments)    Patient states she had a previous reaction to iodinated contrast media agents. Premedicate.   Nsaids Itching   Codeine Itching and Rash   Cyclobenzaprine Other (See Comments)    Severe constipation   Fentanyl Nausea And Vomiting and Other (See Comments)    Severe vomiting As of 01/06/21 pt states she is not allergic   Methadone Other (See Comments)    Change in mental status   Neurontin [Gabapentin] Hives   Tape Itching, Rash and Other (See Comments)    Blisters skin, Please use "paper" tape   Tegretol [Carbamazepine] Hives and Rash   Toradol [Ketorolac Tromethamine] Hives and Rash   Tramadol Hives and Rash    Consultations: none  Procedures/Studies:  S/p of I&D   Discharge Exam: Vitals:   09/01/21 1157 09/01/21 1530  BP: 125/85 110/81  Pulse: 85 87  Resp: 18   Temp: 98.1 F (36.7 C)   SpO2: 100% 93%   Vitals:   09/01/21 1157 09/01/21 1530  BP: 125/85 110/81  Pulse: 85 87  Resp: 18   Temp: 98.1 F (36.7 C)   TempSrc: Oral   SpO2: 100% 93%  Weight: 52.2 kg   Height: 5' (1.524 m)    Physical examination was not performed since patient left hospital on AMA in absence of my presence    The results of significant diagnostics from this hospitalization (including imaging, microbiology, ancillary and laboratory) are listed below for reference.     Microbiology: Recent Results (from the past 240 hour(s))  Resp Panel by RT-PCR (Flu A&B, Covid) Nasopharyngeal Swab     Status:  None   Collection Time: 09/01/21  2:47 PM   Specimen: Nasopharyngeal Swab; Nasopharyngeal(NP) swabs in vial transport medium  Result Value Ref Range Status   SARS Coronavirus 2 by RT PCR NEGATIVE NEGATIVE Final    Comment: (NOTE) SARS-CoV-2 target nucleic acids are NOT DETECTED.  The SARS-CoV-2 RNA is generally detectable in upper respiratory specimens during the acute phase of infection. The lowest concentration of SARS-CoV-2 viral copies this assay can detect is 138 copies/mL. A negative result does not preclude SARS-Cov-2 infection and should not be used as the sole basis for treatment or other patient management decisions. A negative result may occur with  improper specimen collection/handling, submission of specimen other than nasopharyngeal swab, presence of viral mutation(s) within the areas targeted by this assay, and inadequate number of viral copies(<138 copies/mL). A negative result must be combined with clinical observations, patient history, and epidemiological information. The expected result is Negative.  Fact Sheet for Patients:  EntrepreneurPulse.com.au  Fact Sheet for Healthcare Providers:  IncredibleEmployment.be  This test is no t yet approved or cleared by the [place] FDA and  has been authorized for detection and/or diagnosis of SARS-CoV-2 by FDA under an Emergency Use Authorization (EUA). This EUA will remain  in effect (meaning this test can be used) for the duration of the COVID-19 declaration under Section 564(b)(1) of the Act, 21 U.S.C.section 360bbb-3(b)(1), unless the authorization is terminated  or revoked sooner.       Influenza A by PCR NEGATIVE NEGATIVE Final   Influenza B by PCR NEGATIVE NEGATIVE Final    Comment: (NOTE) The Xpert Xpress SARS-CoV-2/FLU/RSV plus assay is intended as an aid in the diagnosis of influenza from Nasopharyngeal swab specimens and should not be used as a sole basis for  treatment. Nasal washings and aspirates are unacceptable for Xpert Xpress SARS-CoV-2/FLU/RSV testing.  Fact Sheet for Patients: EntrepreneurPulse.com.au  Fact Sheet for Healthcare Providers: IncredibleEmployment.be  This test is not yet approved or cleared by the [place] FDA and has been authorized for detection and/or diagnosis of SARS-CoV-2 by FDA under an Emergency Use Authorization (EUA). This EUA will remain in effect (meaning this test can be used) for the duration of the COVID-19 declaration under Section 564(b)(1) of the Act, 21 U.S.C. section 360bbb-3(b)(1), unless the authorization is terminated or revoked.  Performed at [facility], [address] [place], [place] [address]      Labs: BNP (last 3 results) No results for input(s): BNP in the last 8760 hours. Basic Metabolic Panel: Recent Labs  Lab 09/01/21 1231  NA 139  K 4.1  CL 106  CO2 26  GLUCOSE 79  BUN 16  CREATININE 0.70  CALCIUM 9.0   Liver Function Tests: Recent Labs  Lab 09/01/21 1231  AST 16  ALT 12  ALKPHOS 89  BILITOT 0.5  PROT 7.3  ALBUMIN 3.4*   No results for input(s): LIPASE, AMYLASE in the last 168 hours. No results for input(s): AMMONIA in the last 168 hours. CBC: Recent Labs  Lab 09/01/21 1231  WBC 11.4*  NEUTROABS 8.1*  HGB 11.1*  HCT 34.5*  MCV 92.7  PLT 270   Cardiac Enzymes: No results for input(s): CKTOTAL, CKMB, CKMBINDEX, TROPONINI in the last 168 hours. BNP: Invalid input(s): POCBNP CBG: No results for input(s): GLUCAP in the last 168 hours. D-Dimer No results for input(s): DDIMER in the last 72 hours. Hgb A1c No results for input(s): HGBA1C in the last 72 hours. Lipid Profile No results for input(s): CHOL, HDL, LDLCALC, TRIG, CHOLHDL, LDLDIRECT in the last 72 hours. Thyroid function studies No results for input(s): TSH, T4TOTAL, T3FREE, THYROIDAB in the last 72 hours.  Invalid input(s):  FREET3 Anemia work up No results for input(s): VITAMINB12, FOLATE, FERRITIN, TIBC, IRON, RETICCTPCT in the last 72 hours. Urinalysis    Component Value Date/Time   COLORURINE YELLOW 10/27/2019 [place] 10/27/2019 1134   APPEARANCEUR Clear 02/14/2014 2007   LABSPEC 1.011 10/27/2019 1134   LABSPEC 1.021 02/14/2014 2007   PHURINE 7.0 10/27/2019 1134   GLUCOSEU NEGATIVE 10/27/2019 1134   GLUCOSEU Negative 02/14/2014 2007   HGBUR NEGATIVE 10/27/2019 1134   BILIRUBINUR NEGATIVE 10/27/2019 1134   BILIRUBINUR NEGATIVE 06/14/2015 1419   BILIRUBINUR Negative 02/14/2014 2007   KETONESUR NEGATIVE 10/27/2019 1134   PROTEINUR NEGATIVE 10/27/2019 1134   UROBILINOGEN 0.2 06/14/2015 1419   UROBILINOGEN 0.2 03/10/2012 1900   NITRITE NEGATIVE 10/27/2019 [place] 10/27/2019 1134   LEUKOCYTESUR Negative 02/14/2014 2007   Sepsis Labs Invalid input(s): PROCALCITONIN,  WBC,  LACTICIDVEN Microbiology Recent Results (from the past 240 hour(s))  Resp Panel by RT-PCR (Flu A&B, Covid) Nasopharyngeal Swab     Status: None   Collection Time: 09/01/21  2:47 PM   Specimen: Nasopharyngeal Swab; Nasopharyngeal(NP) swabs in vial transport medium  Result Value Ref Range Status   SARS Coronavirus 2 by RT PCR NEGATIVE NEGATIVE Final    Comment: (NOTE) SARS-CoV-2 target nucleic acids are NOT DETECTED.  The SARS-CoV-2 RNA is generally detectable in upper respiratory specimens during the acute phase of infection. The lowest concentration of SARS-CoV-2 viral copies this assay can detect is 138 copies/mL. A negative result does not preclude SARS-Cov-2 infection and should not be used as the sole basis for treatment or other patient management decisions. A negative result may occur with  improper specimen collection/handling, submission of specimen other than nasopharyngeal swab, presence of viral mutation(s) within the areas targeted by this assay, and inadequate number of  viral copies(<138 copies/mL). A negative result must be combined with clinical observations, patient history, and epidemiological information. The expected result is Negative.  Fact Sheet for Patients:  EntrepreneurPulse.com.au  Fact Sheet for Healthcare Providers:  IncredibleEmployment.be  This test is no t yet approved or cleared by the [place] FDA and  has been authorized for detection and/or diagnosis of SARS-CoV-2 by FDA under an Emergency Use Authorization (EUA). This EUA will remain  in effect (meaning this test can be used) for the duration of the COVID-19 declaration under Section 564(b)(1) of the Act, 21 U.S.C.section 360bbb-3(b)(1), unless the authorization is terminated  or revoked sooner.       Influenza A by PCR NEGATIVE NEGATIVE Final   Influenza B by PCR NEGATIVE NEGATIVE Final    Comment: (NOTE) The Xpert Xpress SARS-CoV-2/FLU/RSV plus assay is intended as an aid in the diagnosis of influenza from Nasopharyngeal swab specimens and should not be used as a sole basis for treatment. Nasal washings and aspirates are unacceptable for Xpert Xpress SARS-CoV-2/FLU/RSV testing.  Fact Sheet for Patients: EntrepreneurPulse.com.au  Fact Sheet for Healthcare Providers: IncredibleEmployment.be  This test is not yet approved or cleared by the [place] FDA and has been authorized for detection and/or diagnosis of SARS-CoV-2 by FDA under an Emergency Use Authorization (EUA). This EUA will remain in effect (meaning this test can be used) for the duration of the COVID-19 declaration under Section 564(b)(1) of the Act, 21 U.S.C. section 360bbb-3(b)(1), unless the authorization is terminated or revoked.  Performed at [facility], [address] [place], [place] [address]    SIGNED:  Ivor Costa, MD [facility] 09/01/2021, 5:09 PM   If 7PM-7AM, please contact  night-coverage [email or web address]

## 2021-09-01 NOTE — ED Notes (Signed)
Pt insisting on leaving AMA. Admit MD informed and states if she doesn't want to stay she can sign paperwork and leave. Pt states she will leave bc she is hurting too bad to stay in hallway. Pt calling for ride at this time.

## 2021-09-01 NOTE — Progress Notes (Signed)
Pharmacy Antibiotic Note  Jamie Schultz is a 61 y.o. female admitted on 09/01/2021. Pharmacy has been consulted for vancomycin dosing for abdominal wall abscess with h/o MRSA.  Plan: Vancomycin 1250 mg given in the ED. Follow with vancomycin 750 mg IV q24h.  Goal AUC 400-550 Expected AUC: 416 SCr used: 0.8   Height: 5' (152.4 cm) Weight: 52.2 kg (115 lb) IBW/kg (Calculated) : 45.5  Temp (24hrs), Avg:98.1 F (36.7 C), Min:98.1 F (36.7 C), Max:98.1 F (36.7 C)  Recent Labs  Lab 09/01/21 1231  WBC 11.4*  CREATININE 0.70  LATICACIDVEN 1.5    Estimated Creatinine Clearance: 52.4 mL/min (by C-G formula based on SCr of 0.7 mg/dL).    Allergies  Allergen Reactions   Bee Venom Anaphylaxis and Other (See Comments)    Bees/wasps/yellow jackets   Keflex [Cephalexin] Anaphylaxis   Penicillins Anaphylaxis and Other (See Comments)    Has patient had a PCN reaction causing immediate rash, facial/tongue/throat swelling, SOB or lightheadedness with hypotension: Yes Has patient had a PCN reaction causing severe rash involving mucus membranes or skin necrosis: No Has patient had a PCN reaction that required hospitalization Yes, in the hospital already Has patient had a PCN reaction occurring within the last 10 years: Yes If all of the above answers are "NO", then may proceed with Cephalosporin use.    Sulfa Antibiotics Other (See Comments)    Renal failure   Hydrocodone Rash and Other (See Comments)    "I couldn't get my breath"   Ibuprofen Other (See Comments)    Vomiting blood   Contrast Media [Iodinated Contrast Media] Other (See Comments)    Patient states she had a previous reaction to iodinated contrast media agents. Premedicate.   Nsaids Itching   Codeine Itching and Rash   Cyclobenzaprine Other (See Comments)    Severe constipation   Fentanyl Nausea And Vomiting and Other (See Comments)    Severe vomiting As of 01/06/21 pt states she is not allergic   Methadone Other (See  Comments)    Change in mental status   Neurontin [Gabapentin] Hives   Tape Itching, Rash and Other (See Comments)    Blisters skin, Please use "paper" tape   Tegretol [Carbamazepine] Hives and Rash   Toradol [Ketorolac Tromethamine] Hives and Rash   Tramadol Hives and Rash    Antimicrobials this admission: Vancomycin 1/2 >>   Microbiology results: 1/2 BCx: pending 1/2 Abscess: pending  Thank you for allowing pharmacy to be a part of this patients care.  Tawnya Crook, PharmD, BCPS Clinical Pharmacist 09/01/2021 2:18 PM

## 2021-09-01 NOTE — ED Provider Notes (Signed)
..  Incision and Drainage  Date/Time: 09/01/2021 3:37 PM Performed by: Duffy Bruce, MD Authorized by: Duffy Bruce, MD   Consent:    Consent obtained:  Verbal   Consent given by:  Patient   Risks discussed:  Bleeding, damage to other organs, incomplete drainage, infection and pain   Alternatives discussed:  Alternative treatment and delayed treatment Universal protocol:    Patient identity confirmed:  Verbally with patient Location:    Type:  Abscess   Location:  Trunk   Trunk location:  Abdomen Pre-procedure details:    Skin preparation:  Betadine Anesthesia:    Anesthesia method:  Local infiltration   Local anesthetic:  Lidocaine 1% WITH epi Procedure type:    Complexity:  Simple Procedure details:    Incision types:  Single straight   Incision depth:  Dermal   Wound management:  Probed and deloculated and irrigated with saline   Drainage:  Purulent   Drainage amount:  Copious   Wound treatment:  Drain placed Post-procedure details:    Procedure completion:  Tolerated well, no immediate complications Comments:     Asked to assist with I&D. Pt has large area of skin breakdown just overlying the abscess, which was noted to have purulent drainage. This was extended and connected to a second incision made among the margin. Penrose drain placed. Recommend surgical consultation if induration/fluctuance persists after ABX.    Duffy Bruce, MD 09/01/21 1540

## 2021-09-02 LAB — C-REACTIVE PROTEIN: CRP: 0.9 mg/dL (ref <1.0)

## 2021-09-04 LAB — AEROBIC CULTURE W GRAM STAIN (SUPERFICIAL SPECIMEN): Gram Stain: NONE SEEN

## 2021-09-05 ENCOUNTER — Other Ambulatory Visit: Payer: Self-pay

## 2021-09-05 DIAGNOSIS — S3991XA Unspecified injury of abdomen, initial encounter: Secondary | ICD-10-CM | POA: Diagnosis present

## 2021-09-05 DIAGNOSIS — Z5321 Procedure and treatment not carried out due to patient leaving prior to being seen by health care provider: Secondary | ICD-10-CM | POA: Insufficient documentation

## 2021-09-05 DIAGNOSIS — X58XXXA Exposure to other specified factors, initial encounter: Secondary | ICD-10-CM | POA: Insufficient documentation

## 2021-09-05 DIAGNOSIS — S31109A Unspecified open wound of abdominal wall, unspecified quadrant without penetration into peritoneal cavity, initial encounter: Secondary | ICD-10-CM | POA: Diagnosis not present

## 2021-09-05 LAB — URINALYSIS, ROUTINE W REFLEX MICROSCOPIC
Bacteria, UA: NONE SEEN
Bilirubin Urine: NEGATIVE
Glucose, UA: NEGATIVE mg/dL
Hgb urine dipstick: NEGATIVE
Ketones, ur: NEGATIVE mg/dL
Leukocytes,Ua: NEGATIVE
Nitrite: NEGATIVE
Protein, ur: NEGATIVE mg/dL
Specific Gravity, Urine: 1.026 (ref 1.005–1.030)
pH: 5 (ref 5.0–8.0)

## 2021-09-05 LAB — COMPREHENSIVE METABOLIC PANEL
ALT: 17 U/L (ref 0–44)
AST: 19 U/L (ref 15–41)
Albumin: 3.4 g/dL — ABNORMAL LOW (ref 3.5–5.0)
Alkaline Phosphatase: 83 U/L (ref 38–126)
Anion gap: 4 — ABNORMAL LOW (ref 5–15)
BUN: 22 mg/dL (ref 8–23)
CO2: 26 mmol/L (ref 22–32)
Calcium: 8.8 mg/dL — ABNORMAL LOW (ref 8.9–10.3)
Chloride: 107 mmol/L (ref 98–111)
Creatinine, Ser: 0.59 mg/dL (ref 0.44–1.00)
GFR, Estimated: >60 mL/min (ref >60)
Glucose, Bld: 96 mg/dL (ref 70–99)
Potassium: 4.4 mmol/L (ref 3.5–5.1)
Sodium: 137 mmol/L (ref 135–145)
Total Bilirubin: 0.4 mg/dL (ref 0.3–1.2)
Total Protein: 7.2 g/dL (ref 6.5–8.1)

## 2021-09-05 LAB — CBC WITH DIFFERENTIAL/PLATELET
Abs Immature Granulocytes: 0.04 10*3/uL (ref 0.00–0.07)
Basophils Absolute: 0.1 10*3/uL (ref 0.0–0.1)
Basophils Relative: 1 %
Eosinophils Absolute: 0.4 10*3/uL (ref 0.0–0.5)
Eosinophils Relative: 5 %
HCT: 34.4 % — ABNORMAL LOW (ref 36.0–46.0)
Hemoglobin: 10.9 g/dL — ABNORMAL LOW (ref 12.0–15.0)
Immature Granulocytes: 1 %
Lymphocytes Relative: 25 %
Lymphs Abs: 1.9 10*3/uL (ref 0.7–4.0)
MCH: 29.3 pg (ref 26.0–34.0)
MCHC: 31.7 g/dL (ref 30.0–36.0)
MCV: 92.5 fL (ref 80.0–100.0)
Monocytes Absolute: 0.7 10*3/uL (ref 0.1–1.0)
Monocytes Relative: 8 %
Neutro Abs: 4.7 10*3/uL (ref 1.7–7.7)
Neutrophils Relative %: 60 %
Platelets: 309 10*3/uL (ref 150–400)
RBC: 3.72 MIL/uL — ABNORMAL LOW (ref 3.87–5.11)
RDW: 16.1 % — ABNORMAL HIGH (ref 11.5–15.5)
WBC: 7.8 10*3/uL (ref 4.0–10.5)
nRBC: 0 % (ref 0.0–0.2)

## 2021-09-05 LAB — LACTIC ACID, PLASMA
Lactic Acid, Venous: 1 mmol/L (ref 0.5–1.9)
Lactic Acid, Venous: 1.8 mmol/L (ref 0.5–1.9)

## 2021-09-05 NOTE — ED Notes (Signed)
Patient to front desk asking about wait time. Pt informed that we cannot tell her how long the wait is. Pt states that she will come back tomorrow when the wait is not as long. Pt informed that we cannot tell her that wait will be shorter tomorrow. Pt states "just forget it, I'm done with yall".

## 2021-09-05 NOTE — ED Notes (Signed)
Pt visualized walking out of ED

## 2021-09-05 NOTE — ED Notes (Signed)
Pt on phone calling her ride

## 2021-09-05 NOTE — ED Triage Notes (Signed)
Pt to ER via POV with complaints of wound present to right upper quadrant of abdominal wall. Patient with penrose drain present inside, reports constant drainage. Reports drain was placed 09/01/21. Reports increased pain to area.

## 2021-09-06 ENCOUNTER — Emergency Department
Admission: EM | Admit: 2021-09-06 | Discharge: 2021-09-06 | Disposition: A | Discharge status: Home / Self Care | Payer: Medicaid Other | Attending: Emergency Medicine | Admitting: Emergency Medicine

## 2021-09-06 LAB — CULTURE, BLOOD (ROUTINE X 2)
Culture: NO GROWTH
Culture: NO GROWTH
Special Requests: ADEQUATE
Special Requests: ADEQUATE

## 2021-09-06 NOTE — ED Notes (Signed)
Called x3 

## 2021-09-14 IMAGING — DX DG FOOT COMPLETE 3+V*R*
3 series · 3 of 3 positions shown · non-contrast
Comparison: None.

CLINICAL DATA: Trip and fall injury, twisting the right ankle.

EXAM:
RIGHT ANKLE - COMPLETE 3+ VIEW; RIGHT FOOT COMPLETE - 3+ VIEW

[foot ap]
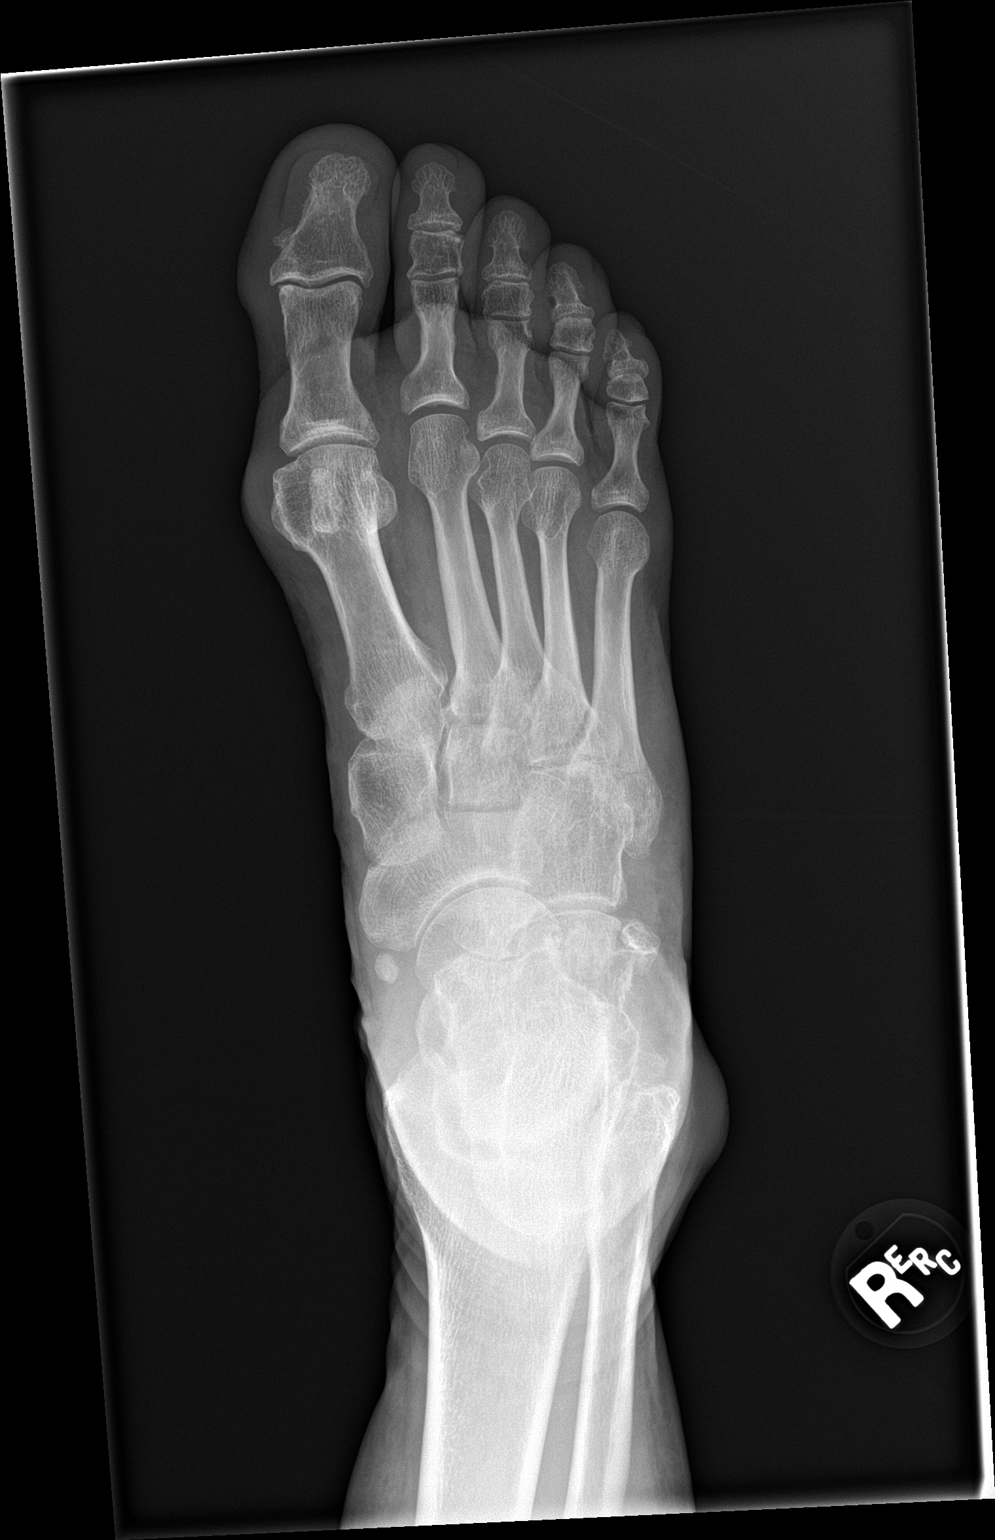

[foot obl]
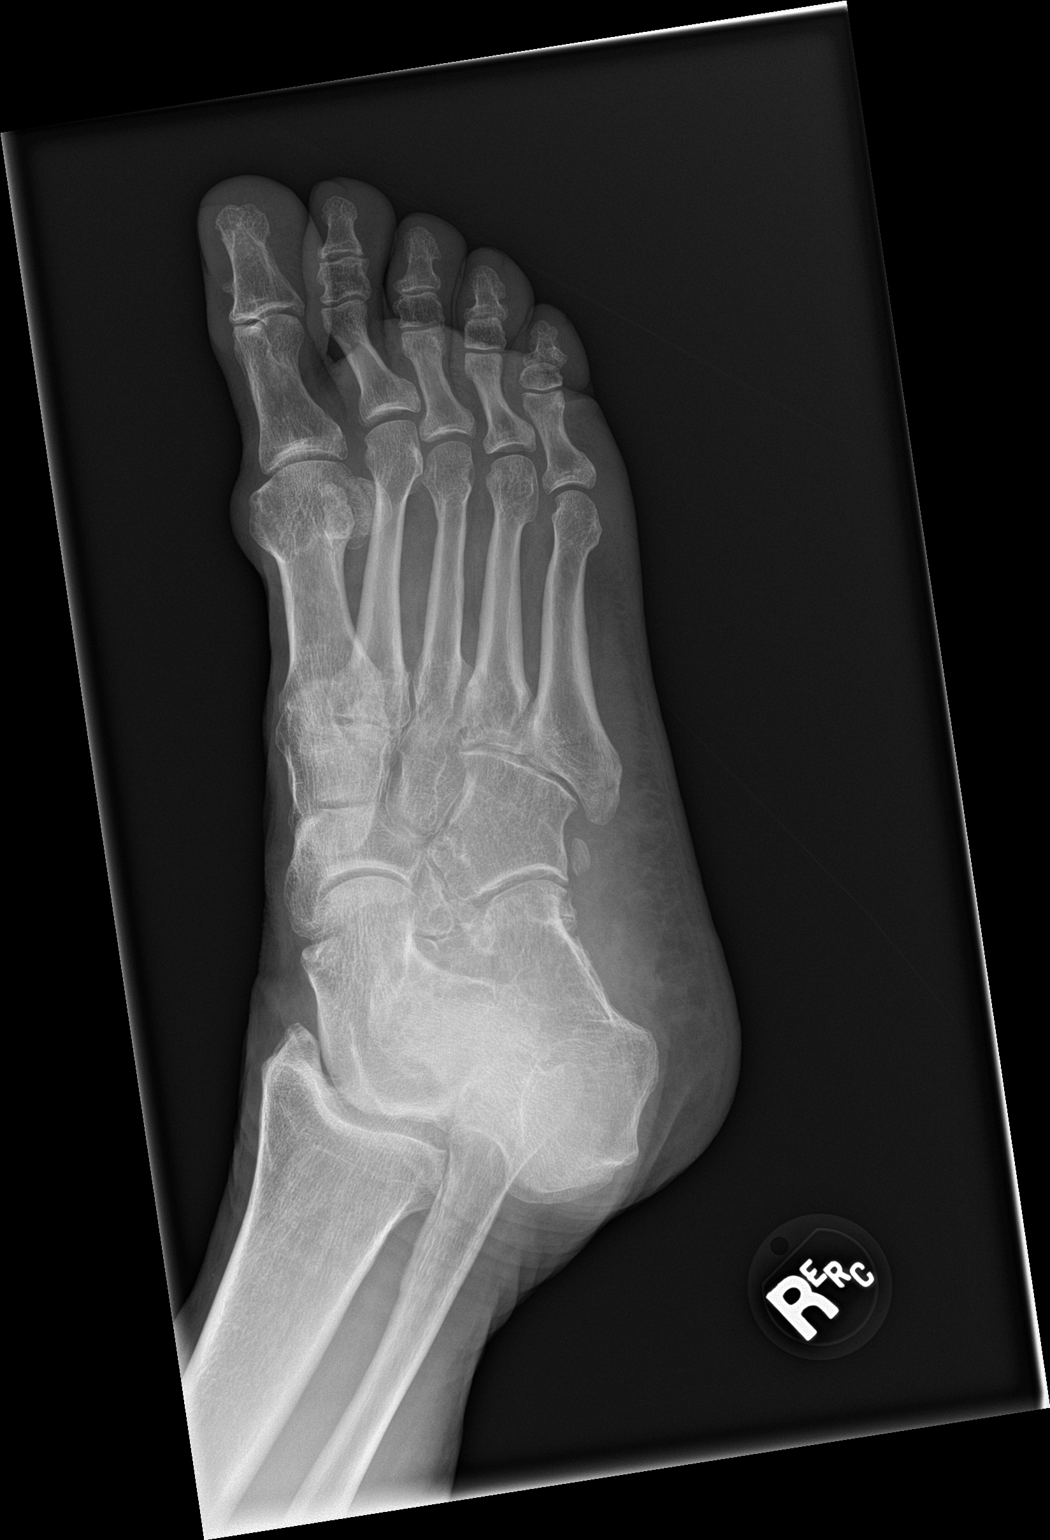

[foot lat]
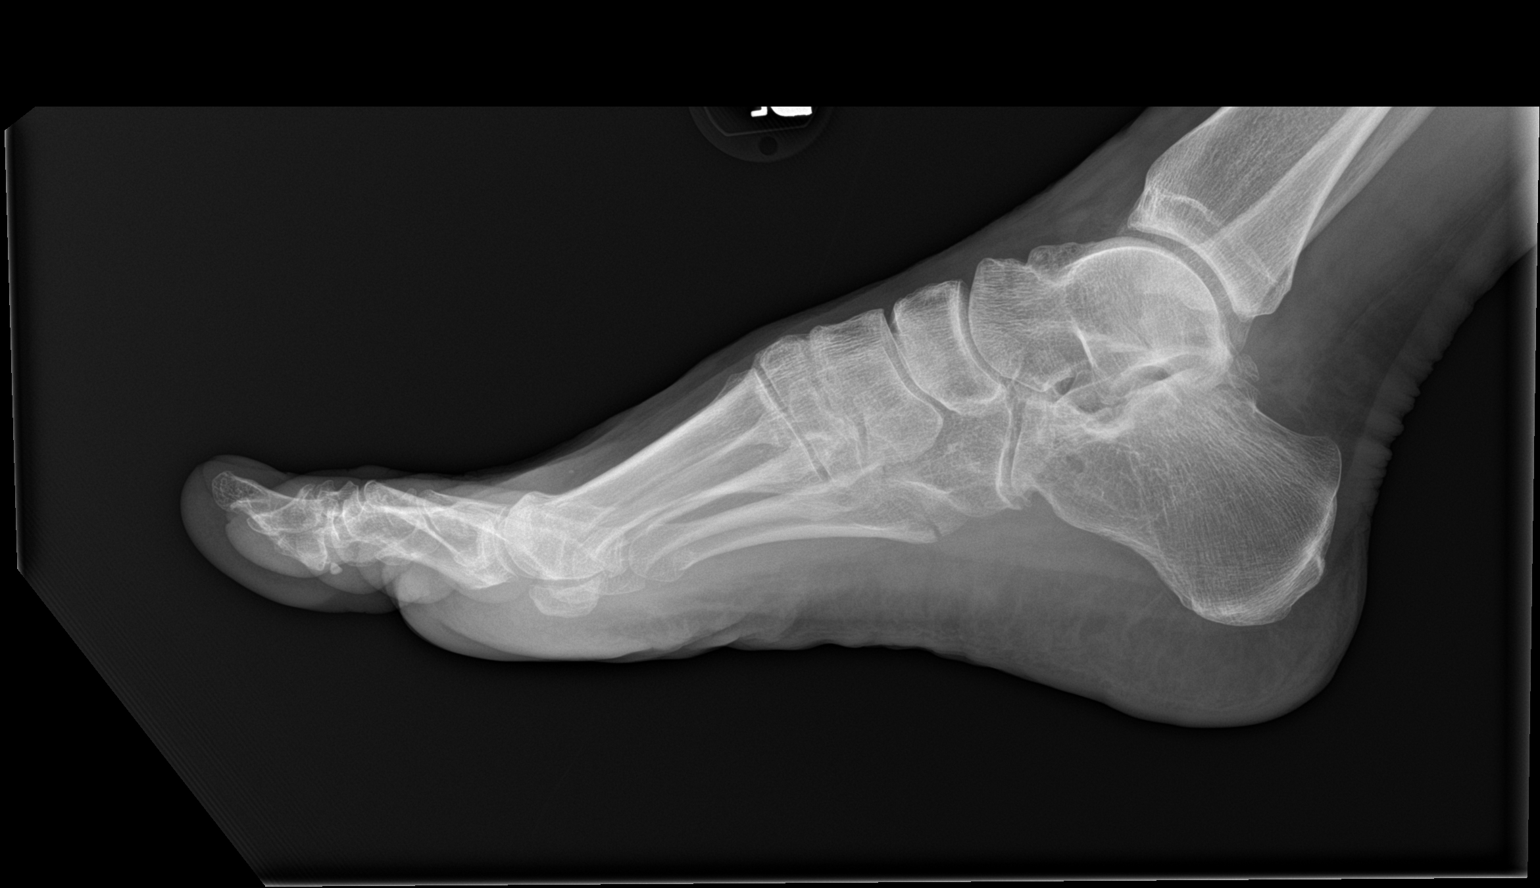

[3 of 3 positions shown; findings below may reference images not displayed]

FINDINGS: Three views of the right foot and three views of the right ankle are
obtained.

Degenerative changes in the interphalangeal joints, first
metatarsal-phalangeal joint, intertarsal joints, and ankle joint.
There is an acute nondisplaced fracture at the base of the fifth
metatarsal with extension to the articular surface. No other
fractures are identified. Mild soft tissue swelling about the
lateral malleolus of the ankle. Old ununited ossicles adjacent to
the navicular and cuboidal bones.
IMPRESSION: 1. Acute nondisplaced fracture at the base of the right fifth
metatarsal with extension to the articular surface.
2. Degenerative changes in the right foot and ankle.

## 2021-09-14 IMAGING — DX DG ANKLE COMPLETE 3+V*R*
3 series · 3 of 3 positions shown · non-contrast
Comparison: None.

CLINICAL DATA: Trip and fall injury, twisting the right ankle.

EXAM:
RIGHT ANKLE - COMPLETE 3+ VIEW; RIGHT FOOT COMPLETE - 3+ VIEW

[ankle obl]
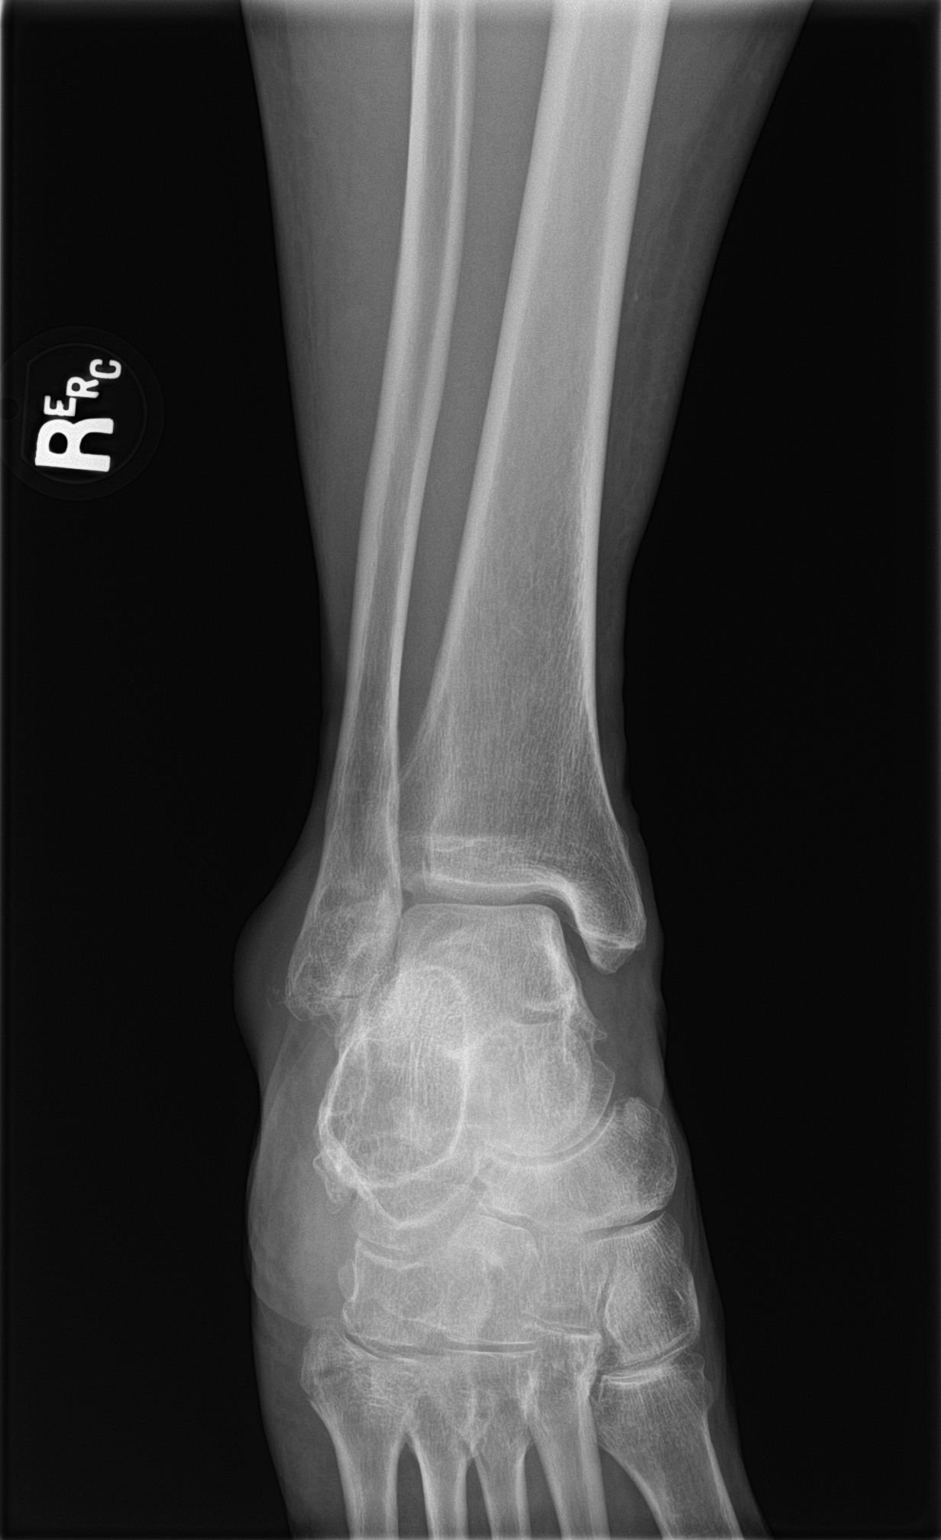

[ankle lat]
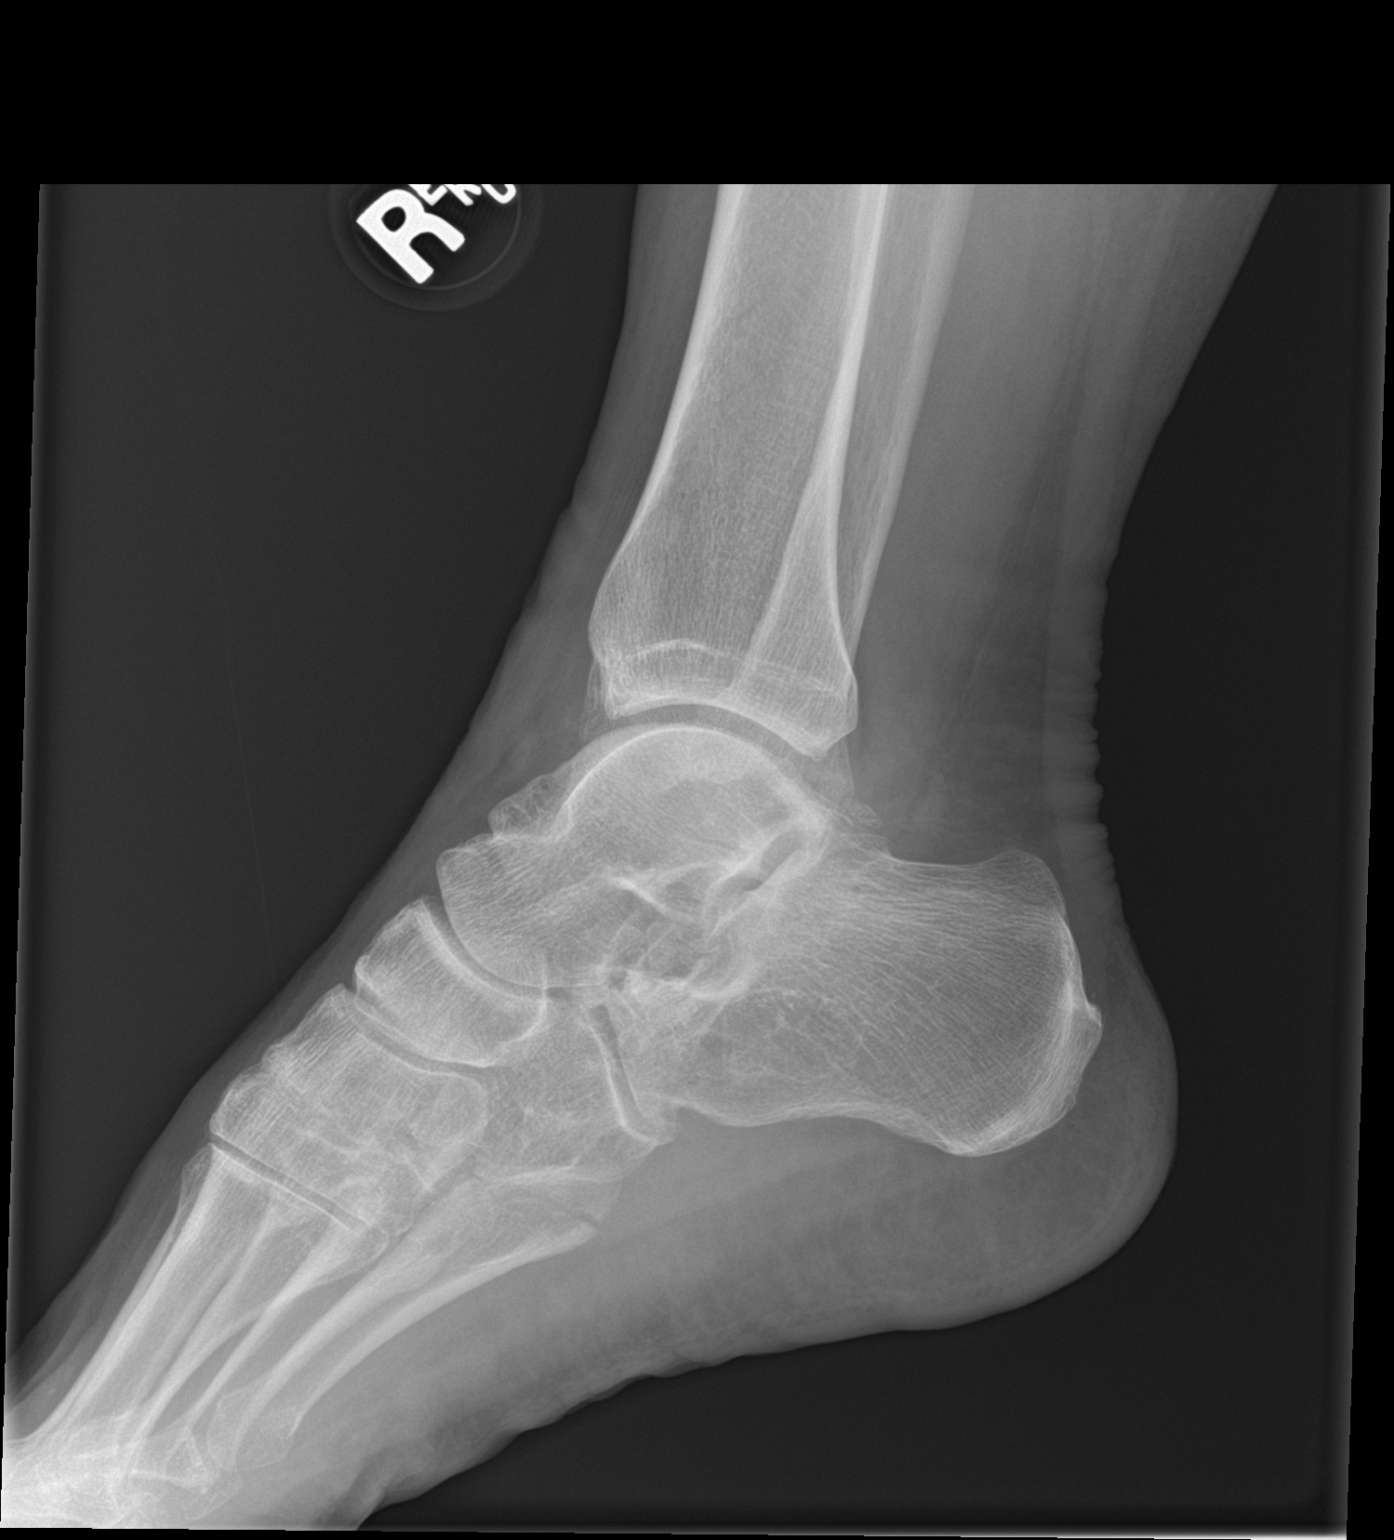

[ankle ap]
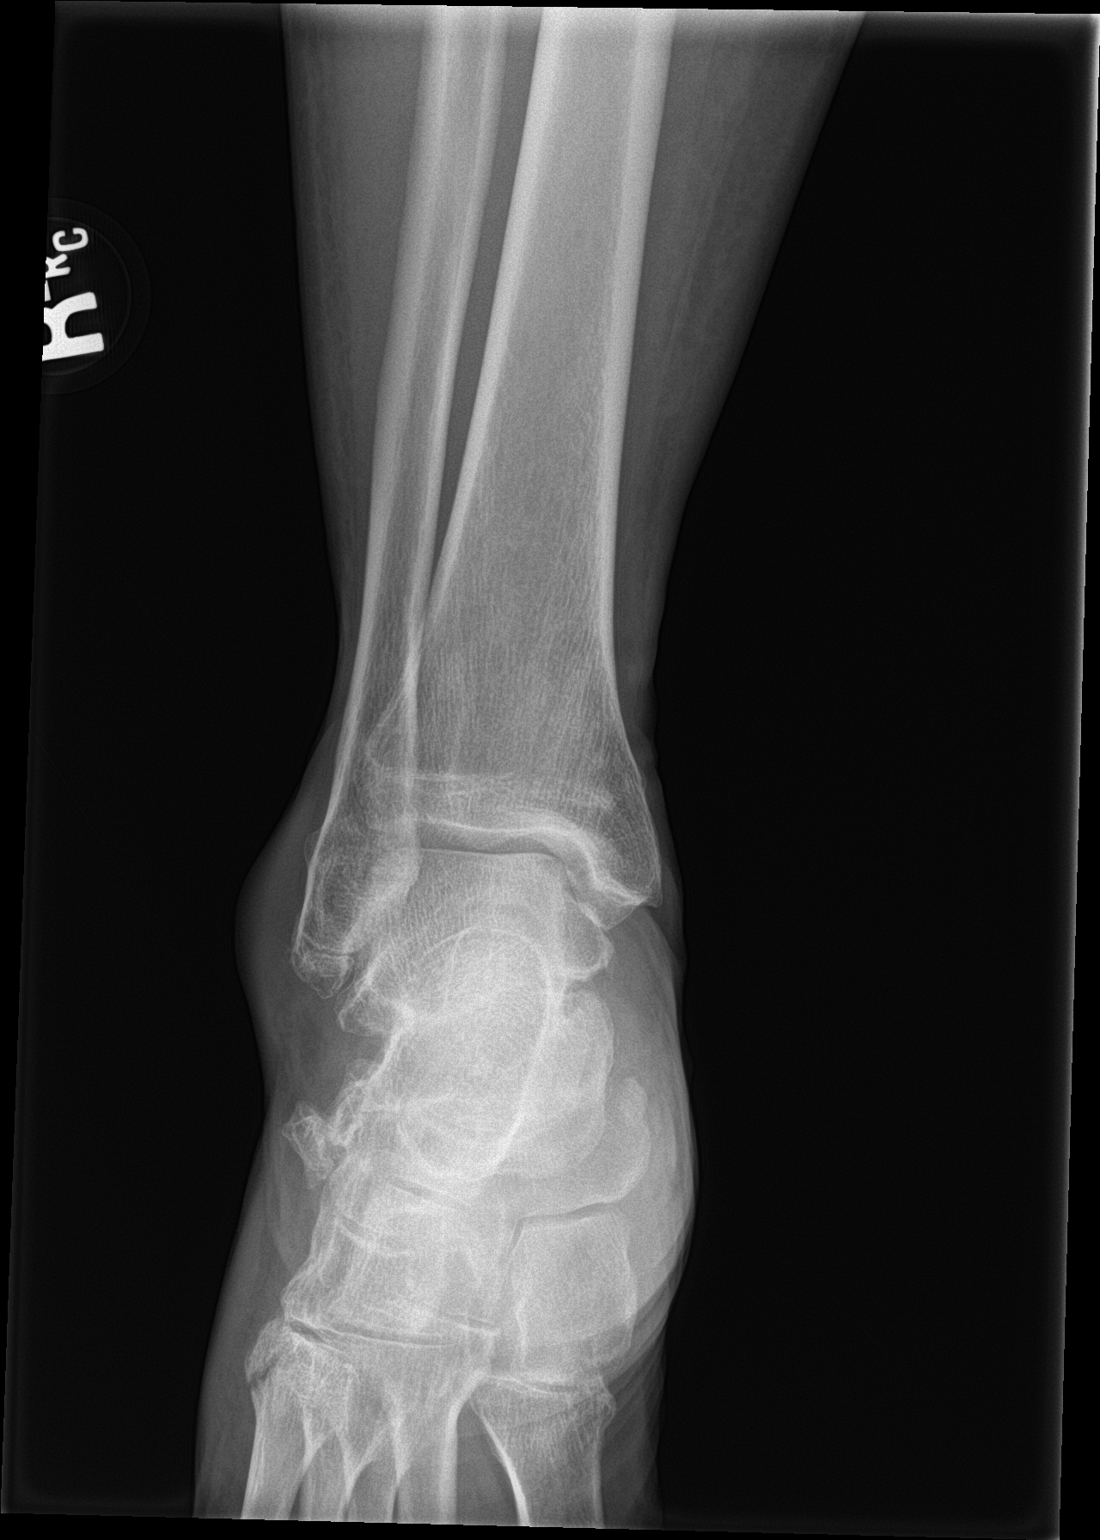

[3 of 3 positions shown; findings below may reference images not displayed]

FINDINGS: Three views of the right foot and three views of the right ankle are
obtained.

Degenerative changes in the interphalangeal joints, first
metatarsal-phalangeal joint, intertarsal joints, and ankle joint.
There is an acute nondisplaced fracture at the base of the fifth
metatarsal with extension to the articular surface. No other
fractures are identified. Mild soft tissue swelling about the
lateral malleolus of the ankle. Old ununited ossicles adjacent to
the navicular and cuboidal bones.
IMPRESSION: 1. Acute nondisplaced fracture at the base of the right fifth
metatarsal with extension to the articular surface.
2. Degenerative changes in the right foot and ankle.

## 2021-09-21 IMAGING — DX DG TIBIA/FIBULA 2V*L*
4 series · 4 of 4 positions shown · non-contrast
Comparison: Plain film the LEFT knee dated 04/18/2018.

CLINICAL DATA: Cellulitis, infection from tattoo.

EXAM:
LEFT TIBIA AND FIBULA - 2 VIEW

[tibia ap (1 of 2)]
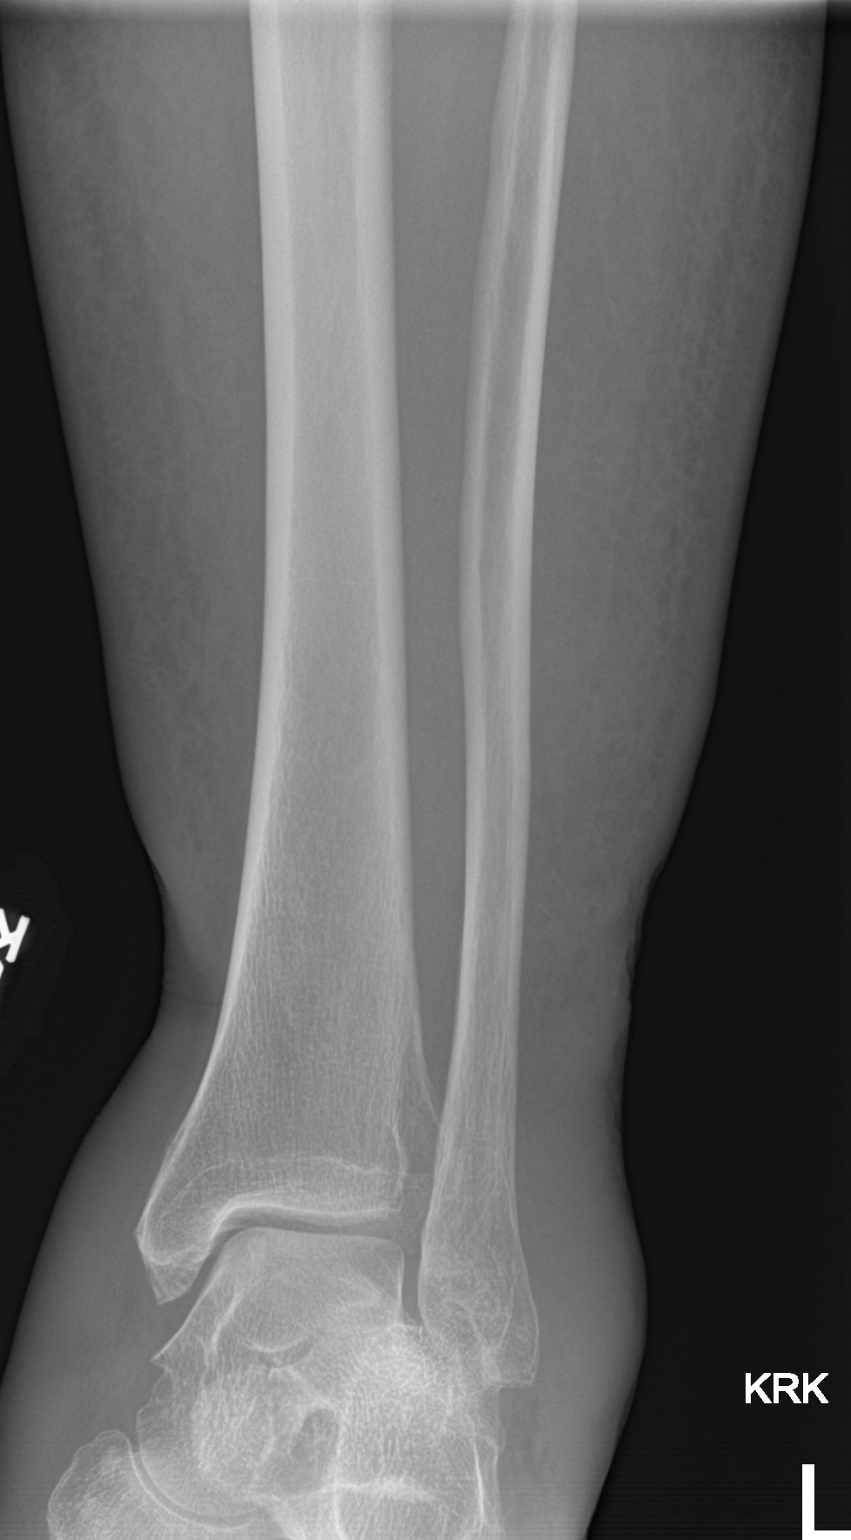

[tibia ap (2 of 2)]
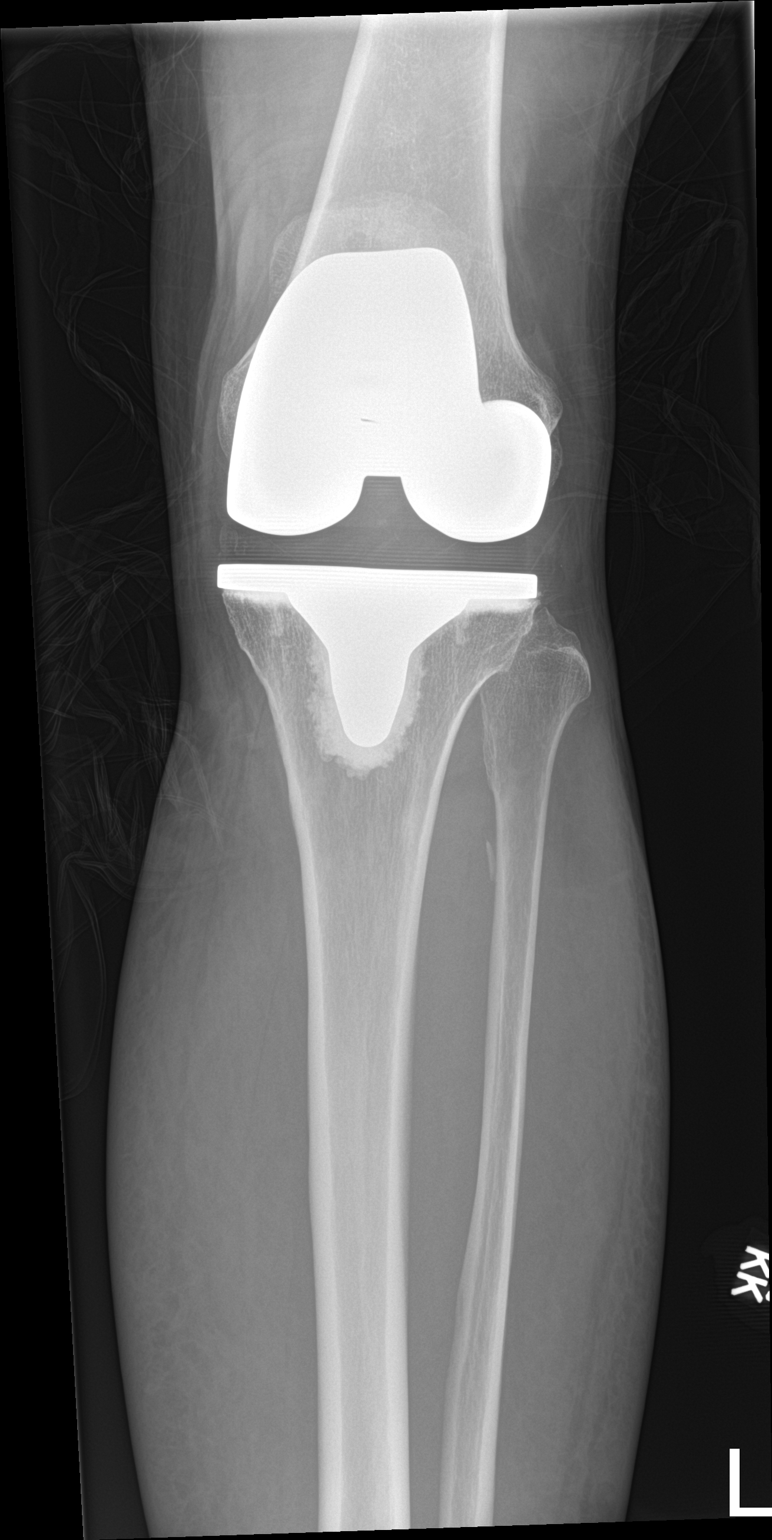

[tibia lat (1 of 2)]
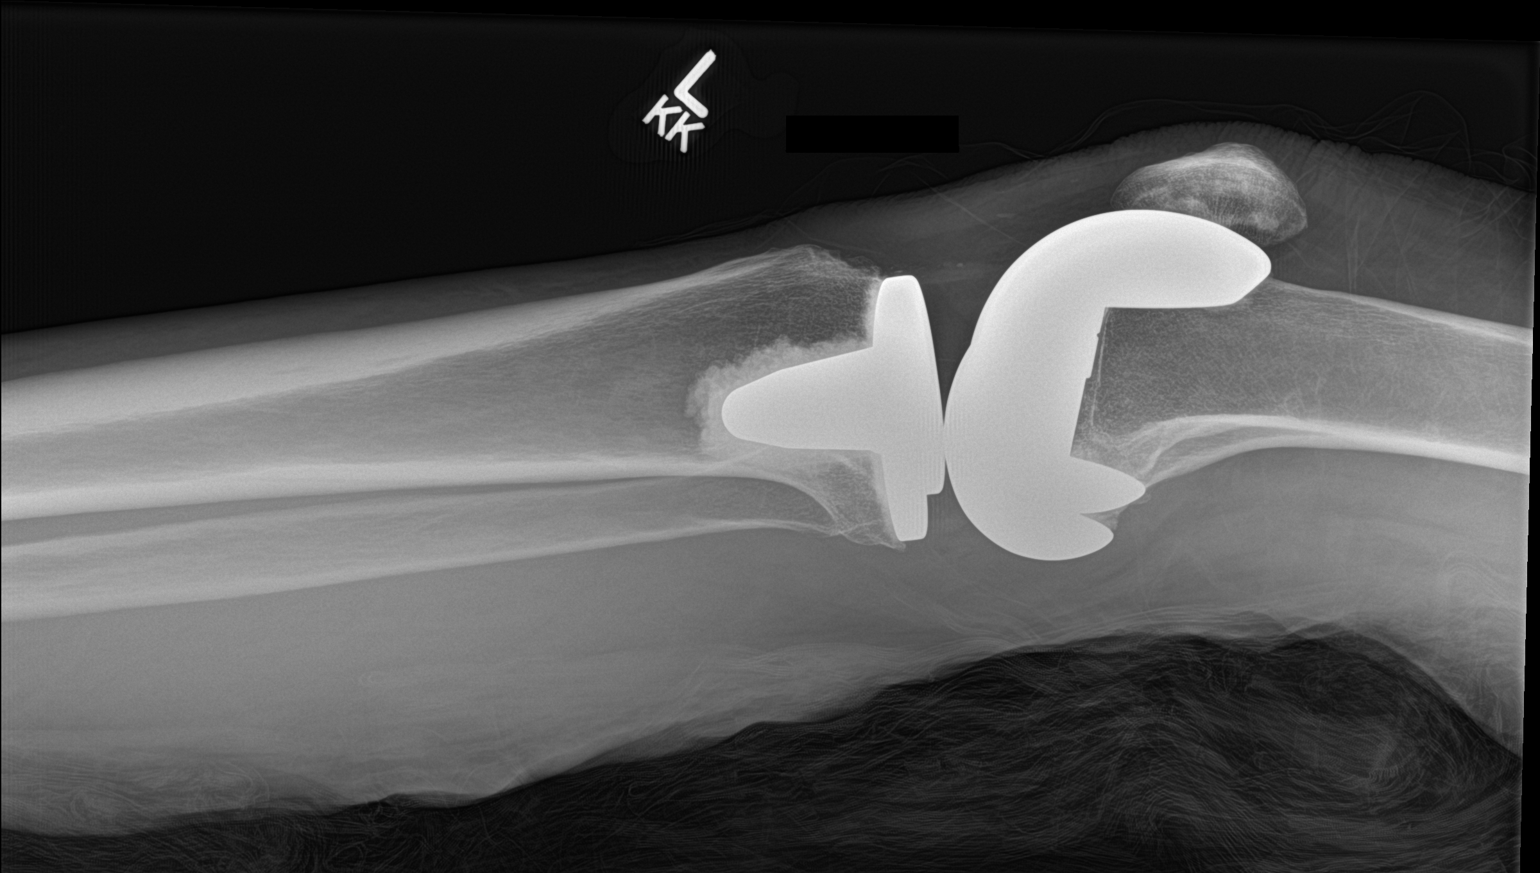

[tibia lat (2 of 2)]
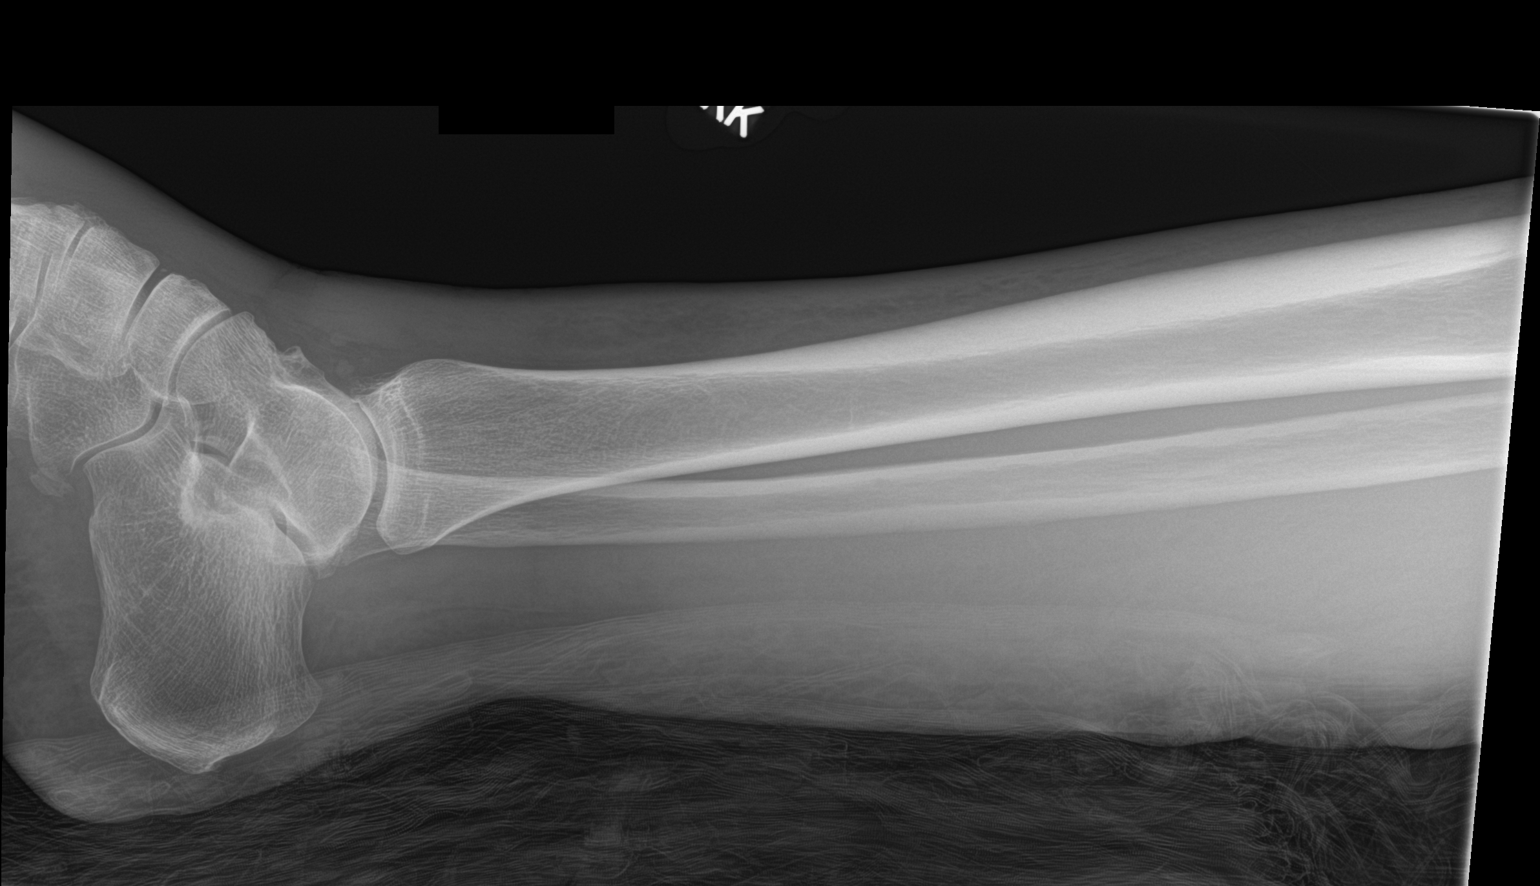

[4 of 4 positions shown; findings below may reference images not displayed]

FINDINGS: Osseous alignment is normal. Bone mineralization is normal. No
fracture line or displaced fracture fragment. No acute-appearing
cortical irregularity or osseous lesion. No evidence of
osteomyelitis.

Apparent soft tissue swelling/edema. No soft tissue gas or foreign
body is seen. No appreciable joint effusion at the LEFT knee. LEFT
knee arthroplasty hardware appears intact and appropriately
positioned.
IMPRESSION: 1. Apparent soft tissue swelling/edema. No soft tissue gas or
foreign body.
2. No osseous abnormality.
3. LEFT knee arthroplasty hardware appears intact and appropriately
positioned.

## 2021-09-22 ENCOUNTER — Telehealth: Payer: Self-pay | Admitting: Surgery

## 2021-09-22 ENCOUNTER — Ambulatory Visit: Payer: Medicaid Other | Admitting: Physician Assistant

## 2021-09-22 IMAGING — CT CT TIBIA FIBULA *L* W/ CM
3 series · 9 of 33 positions shown, 10 images · IV contrast (omnipaque)
Comparison: Left tib fib series 12/29/2020.

CLINICAL DATA: 59-year-old female with soft tissue infection.

EXAM:
CT OF THE LOWER LEFT EXTREMITY WITH CONTRAST
TECHNIQUE: Multidetector CT imaging of the lower right extremity was performed
according to the standard protocol following intravenous contrast
administration.
CONTRAST:  75mL OMNIPAQUE IOHEXOL 300 MG/ML  SOLN

[Series 7: axial st · axial · 0.54mm/px · z∈[+606,+606]mm · 1 of 361 slices shown, 2 images]
[im 194/361  soft-tissue]
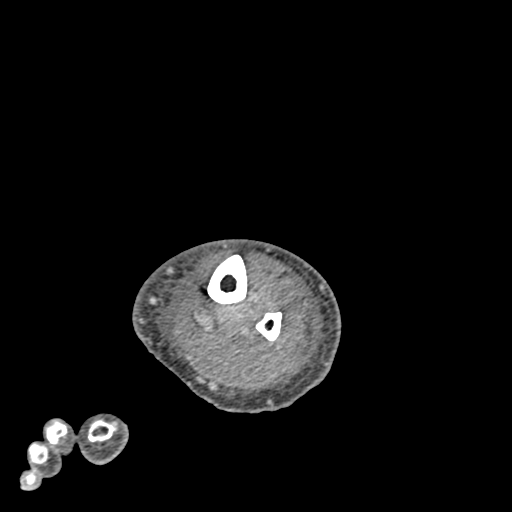
[im 194/361  bone]
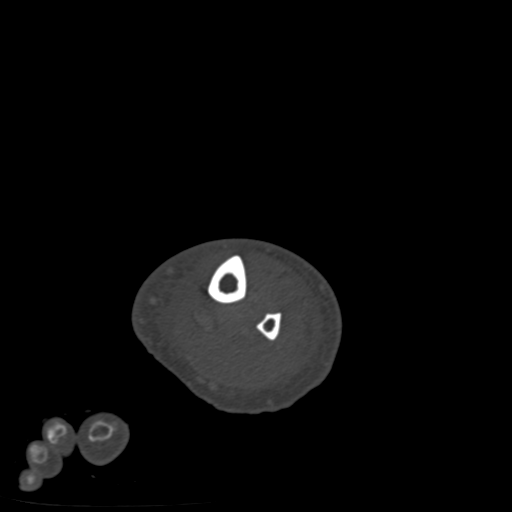

[Series 8: coronal st · coronal · 0.52mm/px · 3 of 111 slices shown]
[im 23/111  bone]
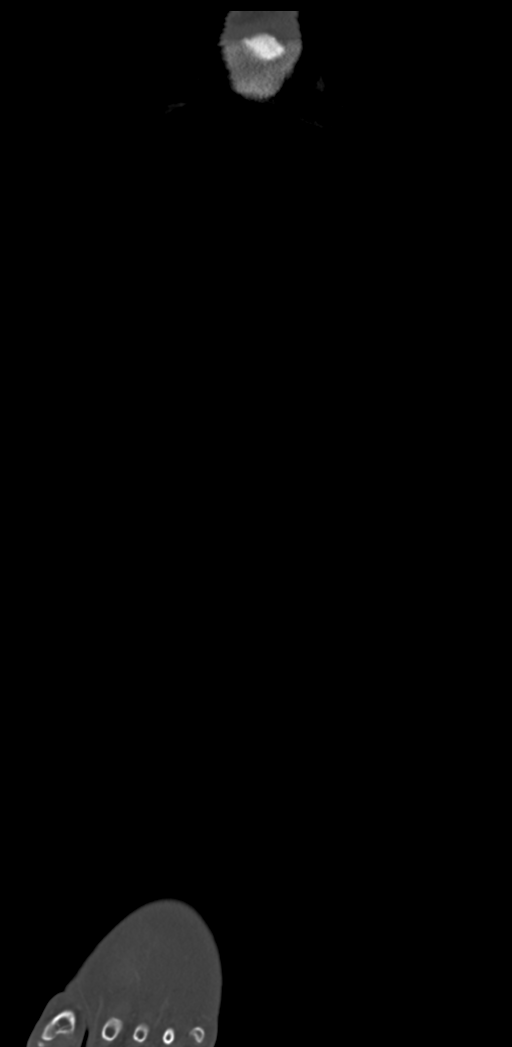
[im 45/111  bone]
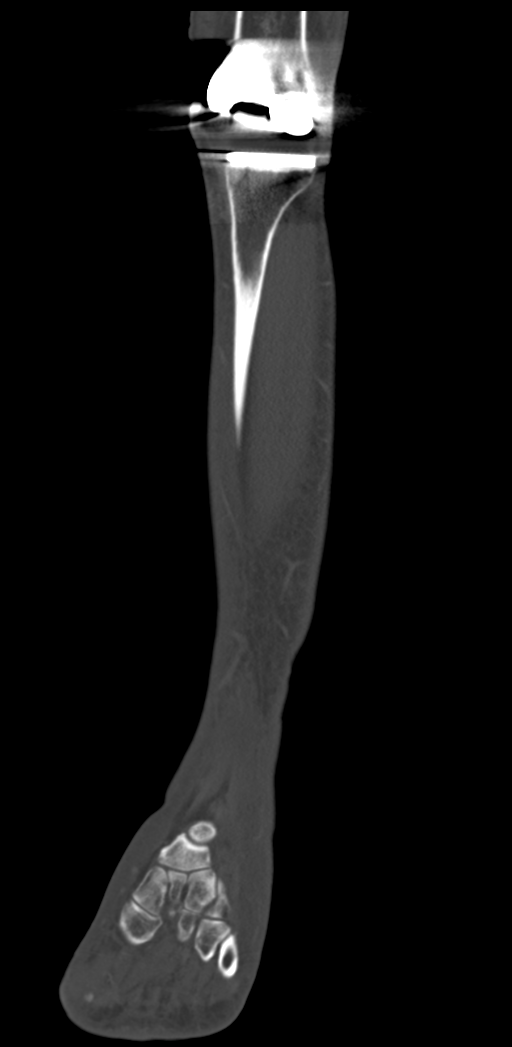
[im 67/111  bone]
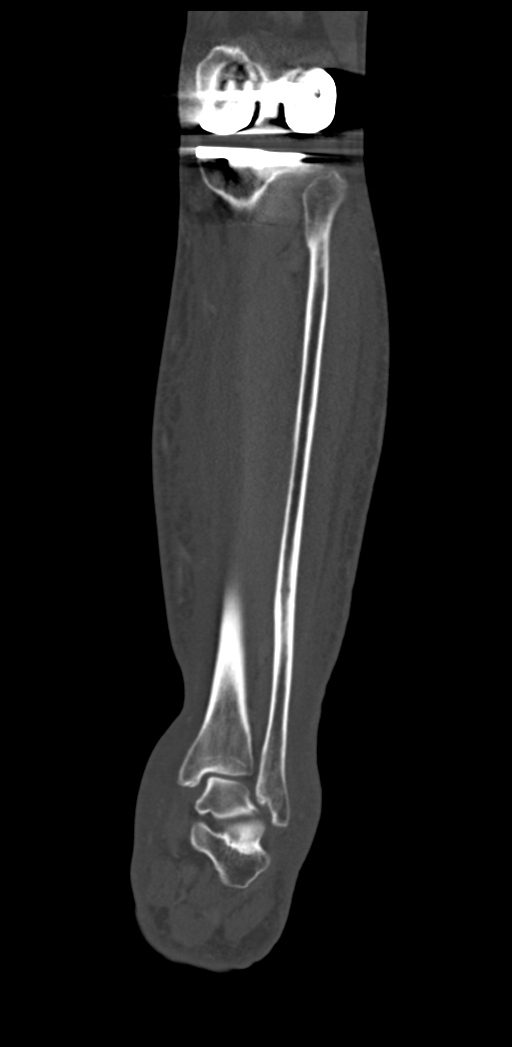

[Series 9: sagittal st · sagittal · 0.45mm/px · 5 of 102 slices shown]
[im 34/102  bone]
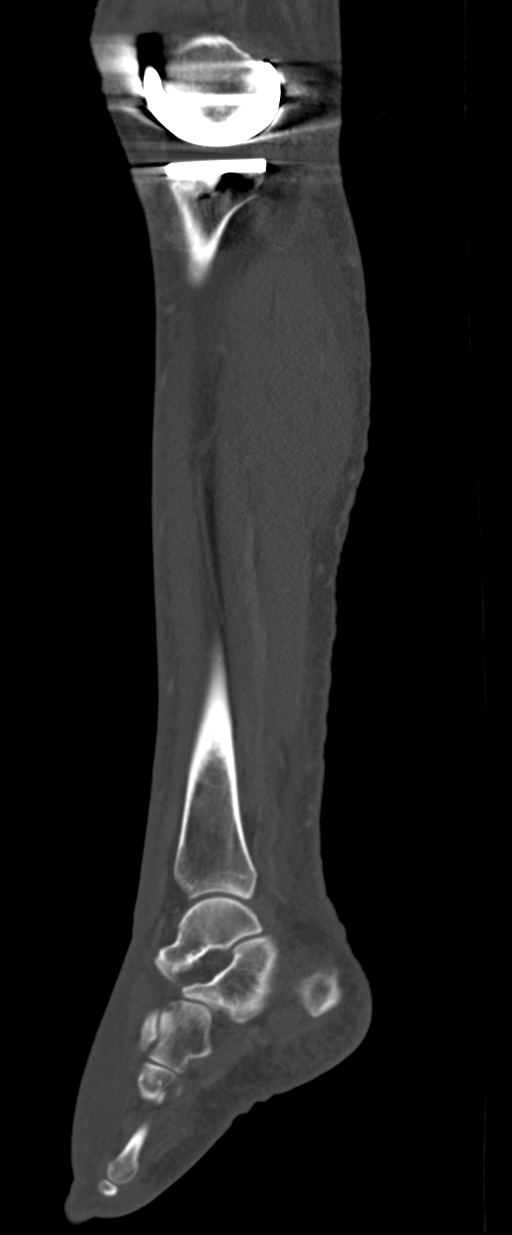
[im 43/102  bone]
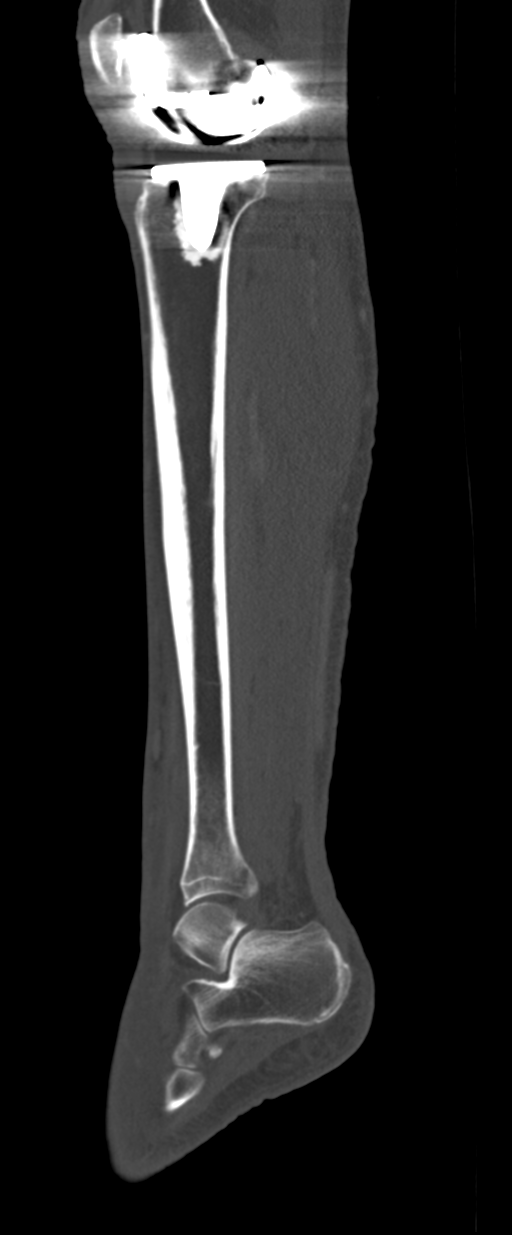
[im 51/102  bone]
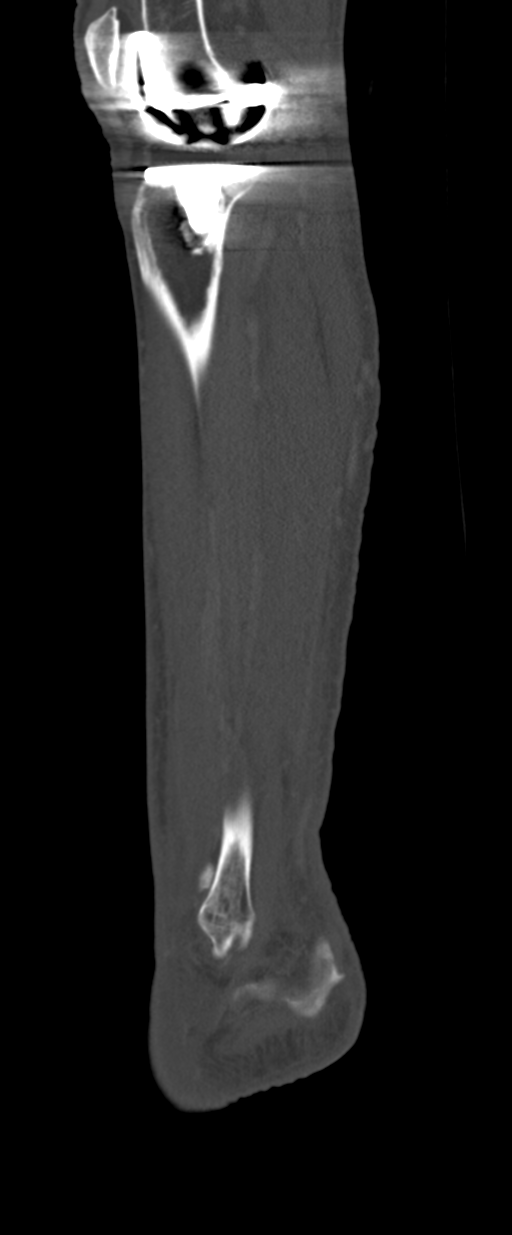
[im 59/102  bone]
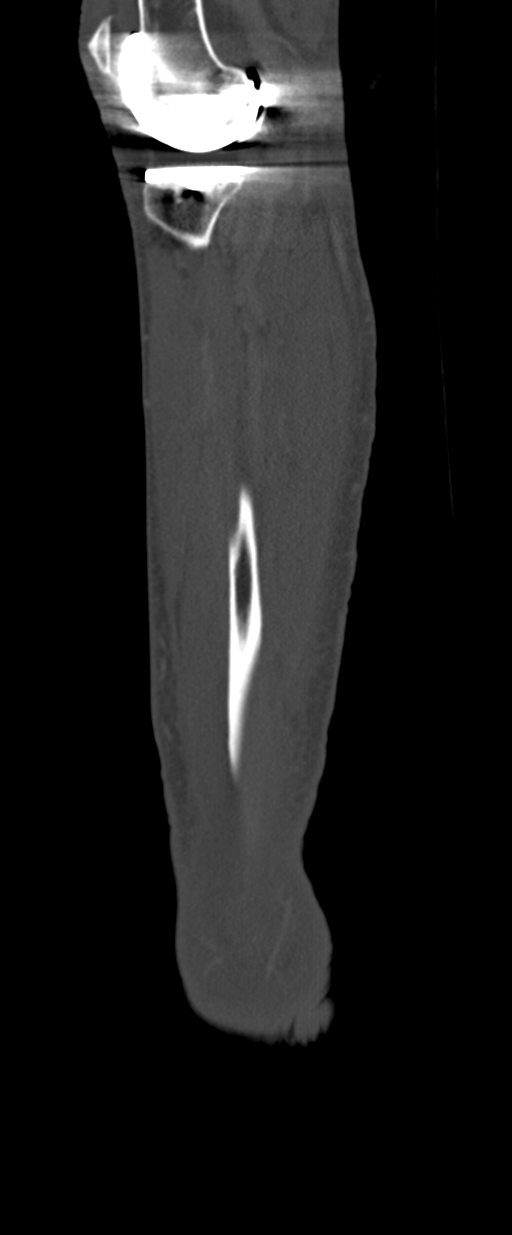
[im 68/102  bone]
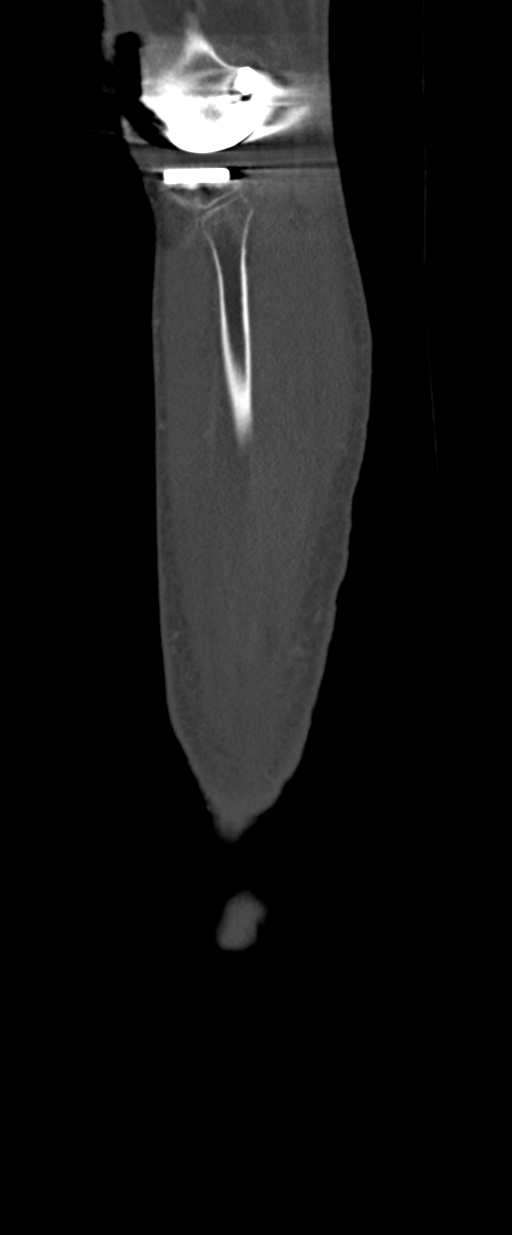

[9 of 33 positions shown; findings below may reference images not displayed]

FINDINGS: Imaging from the distal femur metadiaphysis through most of the left
foot.

Left total knee arthroplasty redemonstrated. No adverse hardware
features. No knee joint effusion is evident.

No acute osseous abnormality identified. There is a small chronic
appearing calcific fragment just proximal to the medial navicular.
Preserved alignment at the left ankle. No ankle joint effusion.

Subcutaneous soft tissues appear normal above the knee, but in the
left tib fib and especially along the medial ankle and dorsal foot
there is moderate to severe generalized subcutaneous edema and
stranding. No soft tissue gas. No discrete or drainable fluid
collection. Mild muscle involvement in the calf is suspected. The
visible major vascular structures including the popliteal artery,
runoff vessels, and veins are enhancing and appear to be patent.
IMPRESSION: 1. Widespread soft tissue edema compatible with cellulitis. Possible
associated myositis.
2. But no acute osseous abnormality. No associated abscess or other
complicating features.
3. Total knee arthroplasty.

## 2021-09-22 NOTE — Telephone Encounter (Signed)
Pt called in requesting an appt w/Dr. Dahlia Byes or Thedore Mins, PA to address a wound on the bottom of her L foot (previously seen/tx'd by Dr. Dahlia Byes & Thedore Mins in June 2022, for L leg wound).  She is states she injured to the bottom of her L foot  (sliced on flooring & then walked on broken glass), however after reviewing her chart the pt has not f/u w/anyone post [facility] ED (09/01/21) where she had an I&D abscess abdomen where she still has a drain in place.  After speaking w/the PA, the pt has been placed on his schedule for today @ 10:30 for the abdominal wound, as she needs to see podiatry for her foot.

## 2021-09-22 NOTE — Telephone Encounter (Signed)
Per Thedore Mins, [place] - The pt has been rescheduled to see him tomorrow (1/24) @ 3:45, aware she needs to arrive by 3:30 pm.

## 2021-09-22 NOTE — Telephone Encounter (Signed)
Patient @ 11:38 am for her 10:30 am work-in appointment.  She is aware that both Dr. Dahlia Byes & Thedore Mins, PA are no longer in the office; she was encouraged to go to the ED for her ongoing pain & drain concerns (placed by the ED on 09/01/21).   The patient was dropped off for her appt & has no way of contacting any friends or family for pick-up.  She continues to decline going to the ED for direct treatment.

## 2021-09-23 ENCOUNTER — Ambulatory Visit: Payer: Medicaid Other | Admitting: Physician Assistant

## 2021-09-24 ENCOUNTER — Other Ambulatory Visit: Payer: Self-pay

## 2021-09-24 ENCOUNTER — Ambulatory Visit (INDEPENDENT_AMBULATORY_CARE_PROVIDER_SITE_OTHER): Payer: Medicaid Other | Admitting: Physician Assistant

## 2021-09-24 ENCOUNTER — Encounter: Payer: Self-pay | Admitting: Physician Assistant

## 2021-09-24 ENCOUNTER — Telehealth: Payer: Self-pay

## 2021-09-24 VITALS — BP 144/83 | HR 84 | Temp 98.7°F | Wt 122.2 lb

## 2021-09-24 DIAGNOSIS — L02211 Cutaneous abscess of abdominal wall: Secondary | ICD-10-CM | POA: Diagnosis not present

## 2021-09-24 IMAGING — DX DG ANKLE COMPLETE 3+V*L*
3 series · 3 of 3 positions shown · non-contrast
Comparison: None.

CLINICAL DATA: Left ankle pain, redness and swelling.

EXAM:
LEFT ANKLE COMPLETE - 3+ VIEW

[ankle ap]
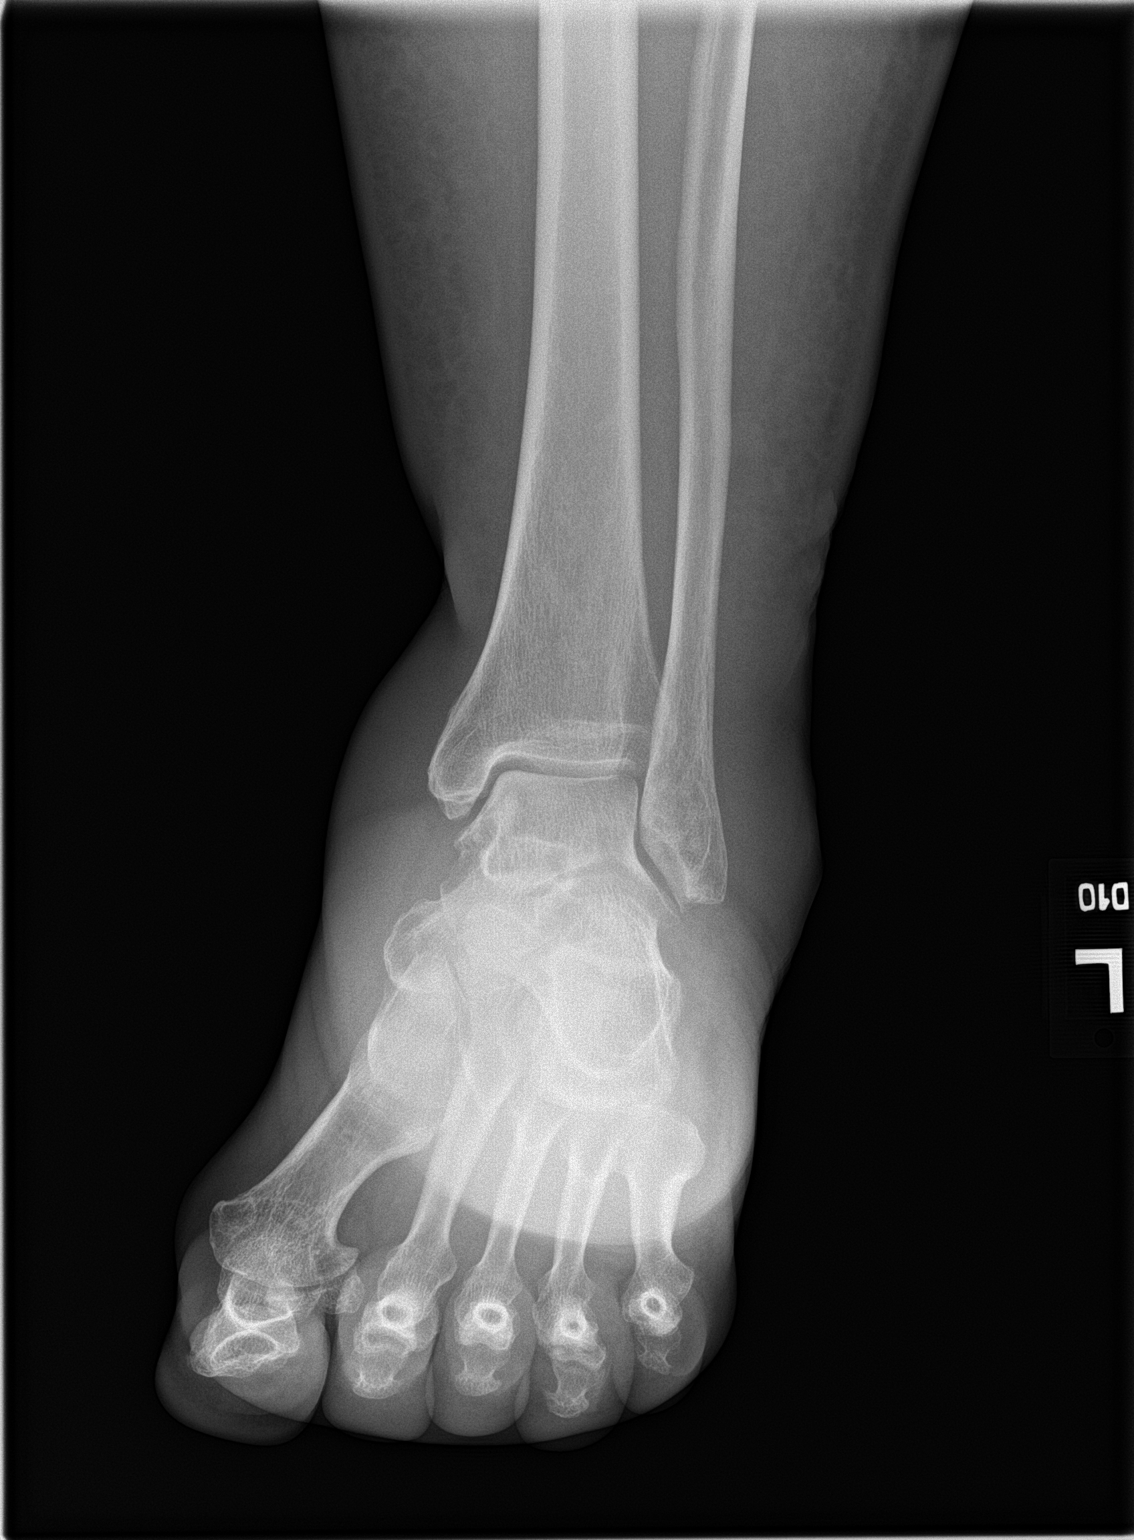

[ankle obl]
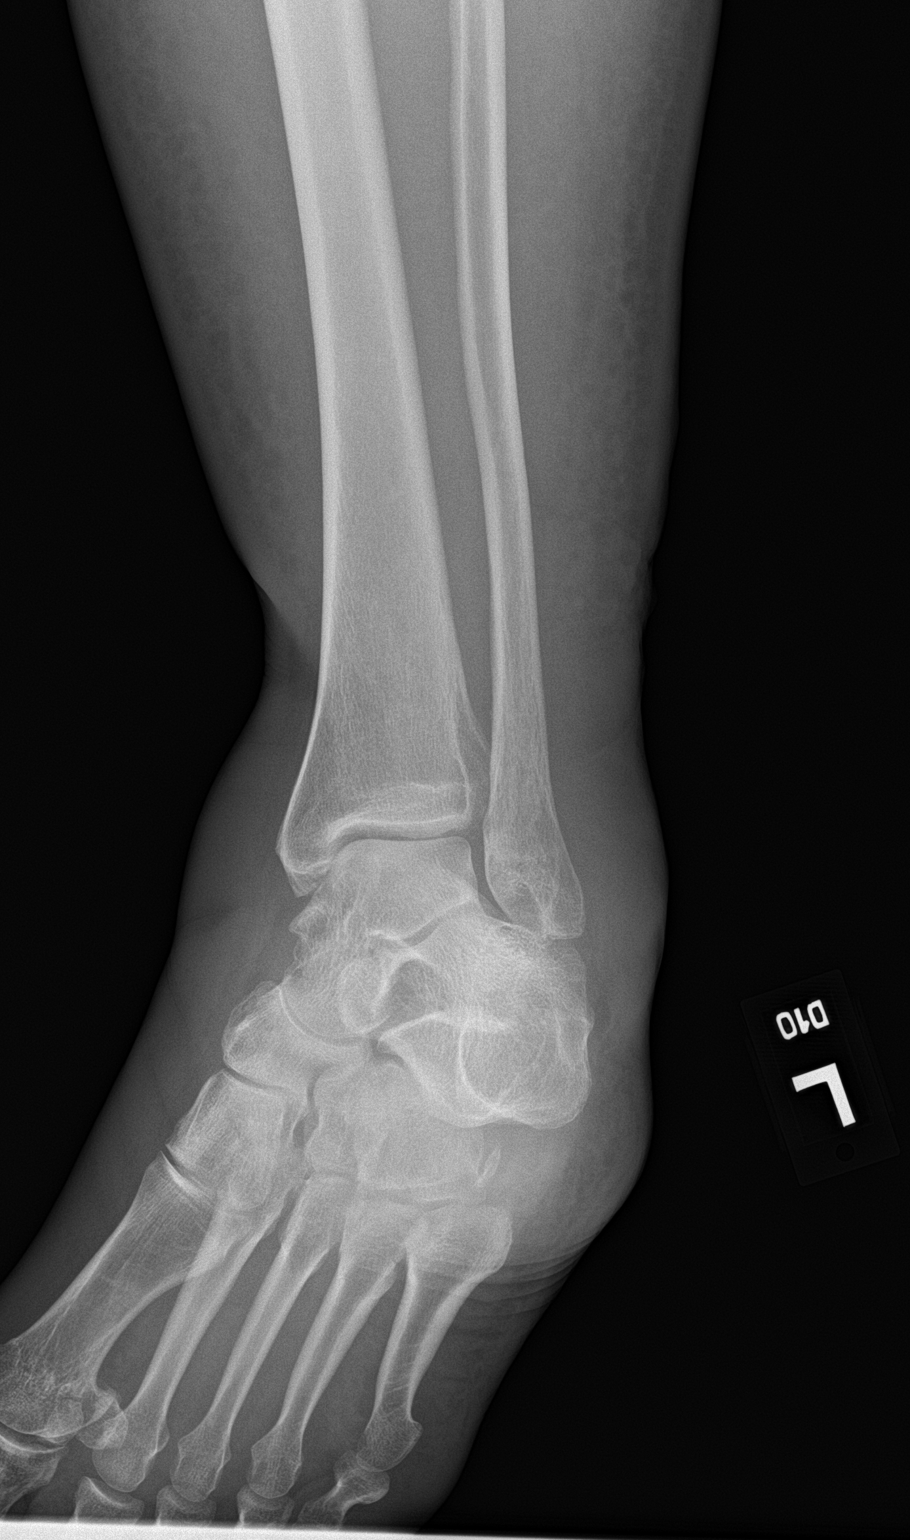

[ankle lat]
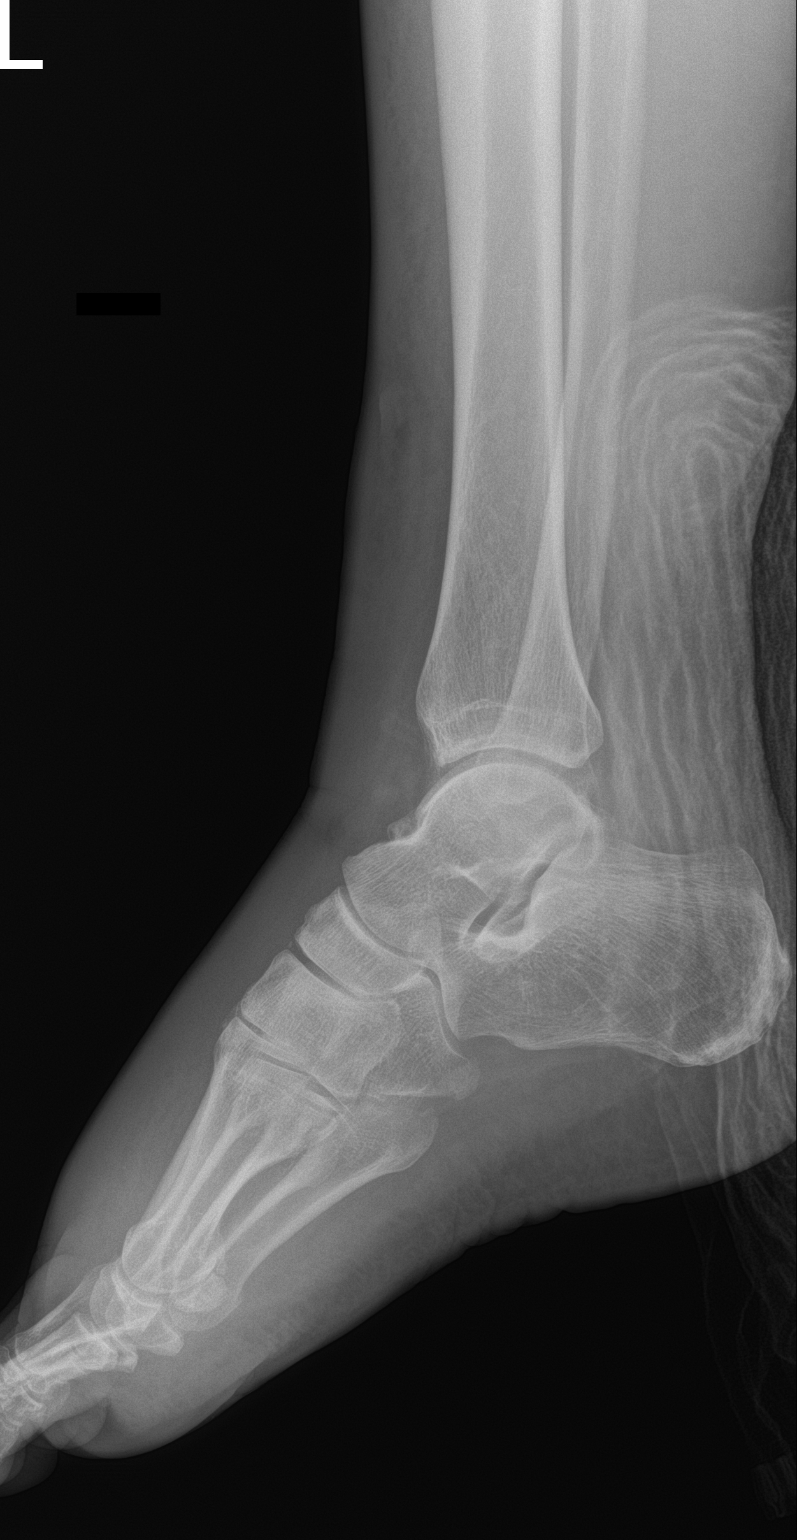

[3 of 3 positions shown; findings below may reference images not displayed]

FINDINGS: Soft tissues appear diffusely swollen. No soft tissue gas or
radiopaque foreign body. No bony or joint abnormality.
IMPRESSION: Soft tissue swelling.  Otherwise negative.

## 2021-09-24 MED ORDER — OXYCODONE HCL 5 MG PO CAPS: 5.0000 mg | ORAL_CAPSULE | Freq: Four times a day (QID) | ORAL | 0 refills | Status: DC | PRN

## 2021-09-24 MED ORDER — OXYCODONE HCL 5 MG PO TABS: 5.0000 mg | ORAL_TABLET | Freq: Four times a day (QID) | ORAL | 0 refills | Status: DC | PRN

## 2021-09-24 NOTE — Telephone Encounter (Signed)
Patient called office at this time- She was seen in office a few minutes ago and went to pharmacy to pick up her prescription it was Oxycodone IR which requires prior authorization. She is requesting new prescription of regular Oxycodone 5 mg . Spoke with Thedore Mins and he will resend new prescription.

## 2021-09-24 NOTE — Patient Instructions (Signed)
Keep dressing on wound for 2 days and then you may take it off.  Please pick up your prescription at your pharmacy.   If you have any concerns or questions, please feel free to call our office. Follow up as needed.   Skin Abscess A skin abscess is an infected area on or under your skin that contains a collection of pus and other material. An abscess may also be called a furuncle, carbuncle, or boil. An abscess can occur in or on almost any part of your body. Some abscesses break open (rupture) on their own. Most continue to get worse unless they are treated. The infection can spread deeper into the body and eventually into your blood, which can make you feel ill. Treatment usually involves draining the abscess. What are the causes? An abscess occurs when germs, like bacteria, pass through your skin and cause an infection. This may be caused by: A scrape or cut on your skin. A puncture wound through your skin, including a needle injection or insect bite. Blocked oil or sweat glands. Blocked and infected hair follicles. A cyst that forms beneath your skin (sebaceous cyst) and becomes infected. What increases the risk? This condition is more likely to develop in people who: Have a weak body defense system (immune system). Have diabetes. Have dry and irritated skin. Get frequent injections or use illegal IV drugs. Have a foreign body in a wound, such as a splinter. Have problems with their lymph system or veins. What are the signs or symptoms? Symptoms of this condition include: A painful, firm bump under the skin. A bump with pus at the top. This may break through the skin and drain. Other symptoms include: Redness surrounding the abscess site. Warmth. Swelling of the lymph nodes (glands) near the abscess. Tenderness. A sore on the skin. How is this diagnosed? This condition may be diagnosed based on: A physical exam. Your medical history. A sample of pus. This may be used to find  out what is causing the infection. Blood tests. Imaging tests, such as an ultrasound, CT scan, or MRI. How is this treated? A small abscess that drains on its own may not need treatment. Treatment for larger abscesses may include: Moist heat or heat pack applied to the area several times a day. A procedure to drain the abscess (incision and drainage). Antibiotic medicines. For a severe abscess, you may first get antibiotics through an IV and then change to antibiotics by mouth. Follow these instructions at home: Medicines  Take over-the-counter and prescription medicines only as told by your health care provider. If you were prescribed an antibiotic medicine, take it as told by your health care provider. Do not stop taking the antibiotic even if you start to feel better. Abscess care  If you have an abscess that has not drained, apply heat to the affected area. Use the heat source that your health care provider recommends, such as a moist heat pack or a heating pad. Place a towel between your skin and the heat source. Leave the heat on for 20-30 minutes. Remove the heat if your skin turns bright red. This is especially important if you are unable to feel pain, heat, or cold. You may have a greater risk of getting burned. Follow instructions from your health care provider about how to take care of your abscess. Make sure you: Cover the abscess with a bandage (dressing). Change your dressing or gauze as told by your health care provider. Wash your hands  with soap and water before you change the dressing or gauze. If soap and water are not available, use hand sanitizer. Check your abscess every day for signs of a worsening infection. Check for: More redness, swelling, or pain. More fluid or blood. Warmth. More pus or a bad smell. General instructions To avoid spreading the infection: Do not share personal care items, towels, or hot tubs with others. Avoid making skin contact with other  people. Keep all follow-up visits as told by your health care provider. This is important. Contact a health care provider if you have: More redness, swelling, or pain around your abscess. More fluid or blood coming from your abscess. Warm skin around your abscess. More pus or a bad smell coming from your abscess. A fever. Muscle aches. Chills or a general ill feeling. Get help right away if you: Have severe pain. See red streaks on your skin spreading away from the abscess. Summary A skin abscess is an infected area on or under your skin that contains a collection of pus and other material. A small abscess that drains on its own may not need treatment. Treatment for larger abscesses may include having a procedure to drain the abscess and taking an antibiotic. This information is not intended to replace advice given to you by your health care provider. Make sure you discuss any questions you have with your health care provider. Document Revised: 12/08/2018 Document Reviewed: 09/30/2017 Elsevier Patient Education  2022 [employer].
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
## 2021-09-24 NOTE — Addendum Note (Signed)
Addended by: Edison Simon R on: 09/24/2021 02:25 PM   Modules accepted: Orders

## 2021-09-25 IMAGING — MR MR ANKLE*L* W/O CM
5 series · 40 of 40 positions shown · non-contrast
Comparison: X-ray 01/01/2021, CT 12/30/2020

CLINICAL DATA: Left ankle cellulitis

EXAM:
MRI OF THE LEFT ANKLE WITHOUT CONTRAST
TECHNIQUE: Multiplanar, multisequence MR imaging of the ankle was performed. No
intravenous contrast was administered.

[Series 4: PD fat-sat · axial · left · 3.0mm · 0.50mm/px · z∈[-54,+86]mm · 9 of 36 slices shown]
[im 1/36]
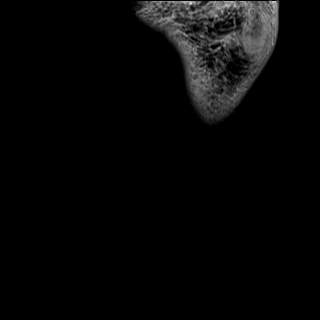
[im 5/36]
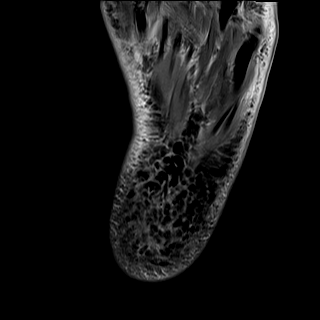
[im 9/36]
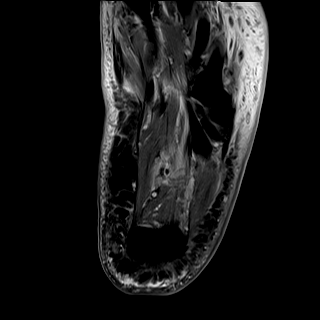
[im 14/36]
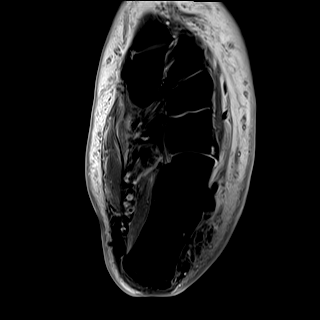
[im 18/36]
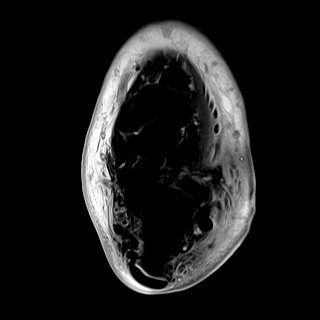
[im 22/36]
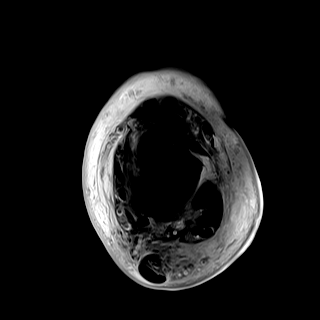
[im 27/36]
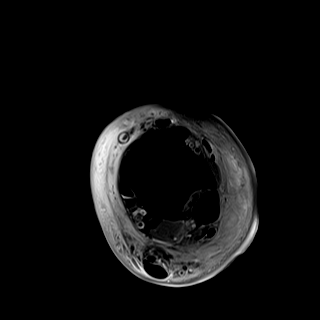
[im 31/36]
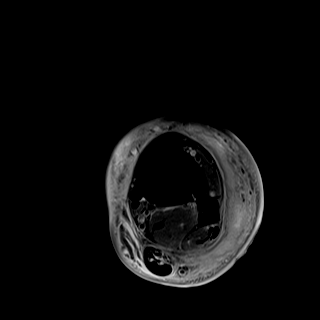
[im 36/36]
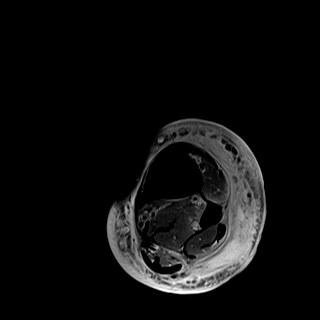

[Series 5: T2 fat-sat · axial · left · 3.0mm · 0.50mm/px · z∈[-54,+86]mm · 9 of 36 slices shown]
[im 1/36]
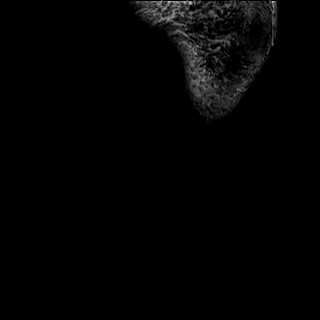
[im 5/36]
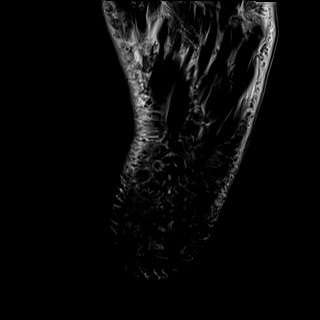
[im 9/36]
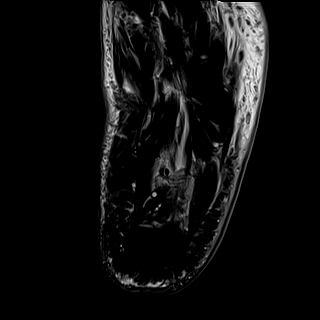
[im 14/36]
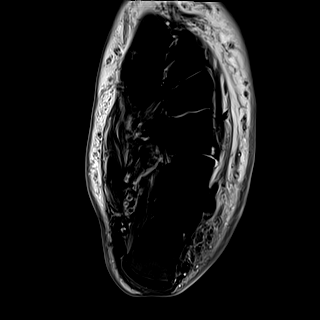
[im 18/36]
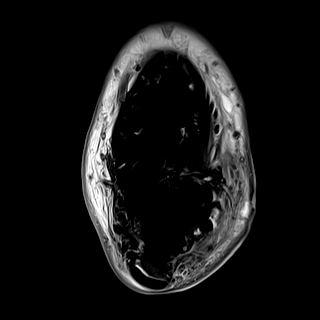
[im 22/36]
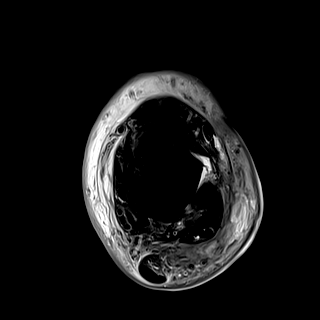
[im 27/36]
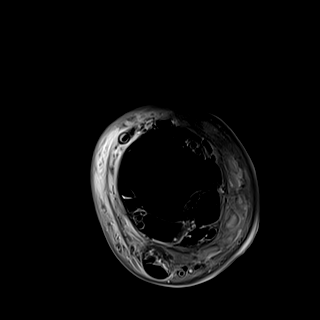
[im 31/36]
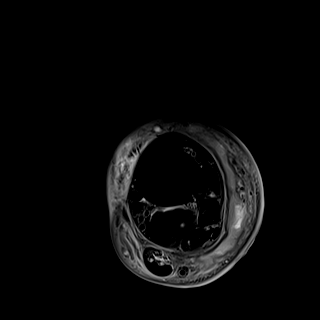
[im 36/36]
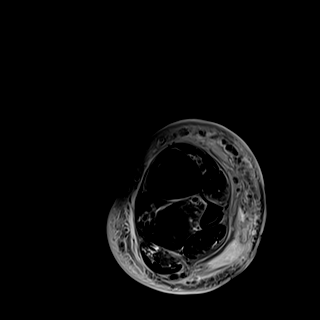

[Series 7: T2 · coronal · left · 3.0mm · 0.62mm/px · 10 of 40 slices shown]
[im 1/40]
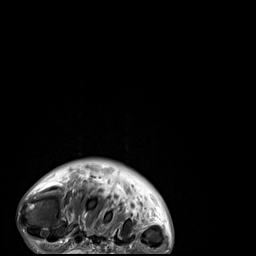
[im 5/40]
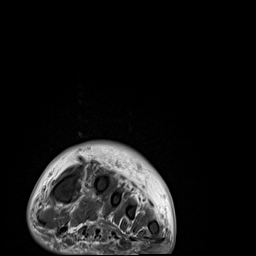
[im 9/40]
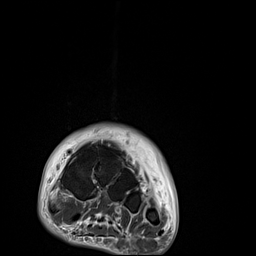
[im 14/40]
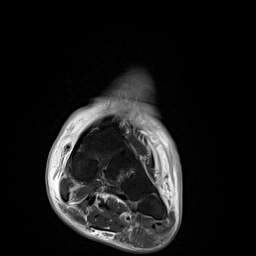
[im 18/40]
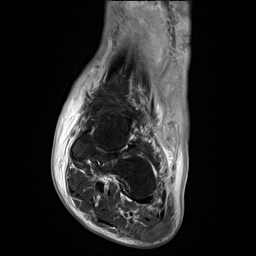
[im 22/40]
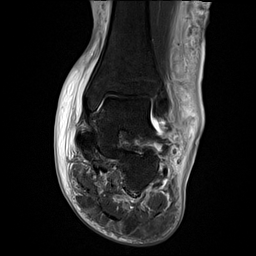
[im 27/40]
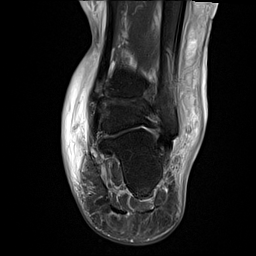
[im 31/40]
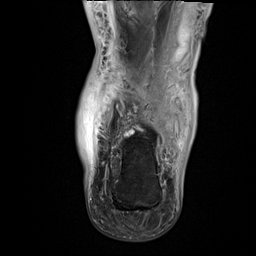
[im 35/40]
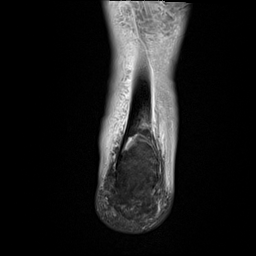
[im 40/40]
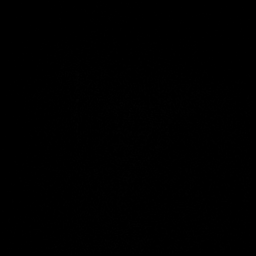

[Series 8: T1 · sagittal · left · 4.0mm · 0.70mm/px · 6 of 23 slices shown]
[im 1/23]
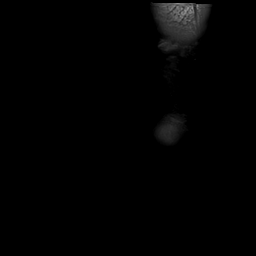
[im 5/23]
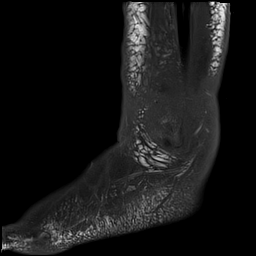
[im 9/23]
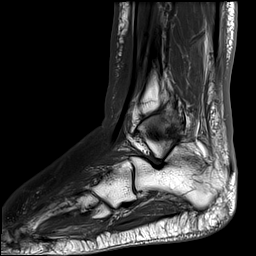
[im 14/23]
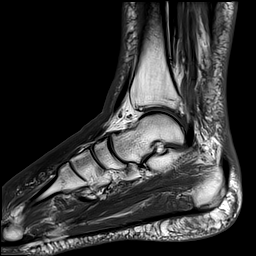
[im 18/23]
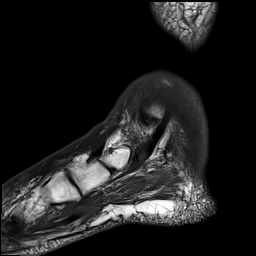
[im 23/23]
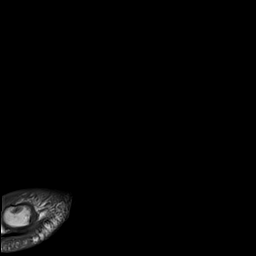

[Series 9: STIR · sagittal · left · 4.0mm · 0.35mm/px · 6 of 23 slices shown]
[im 1/23]
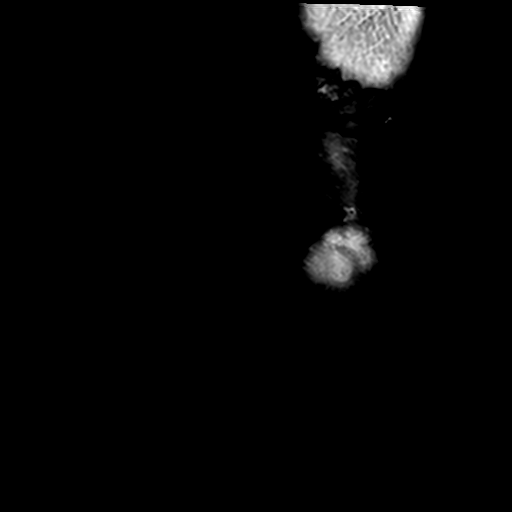
[im 5/23]
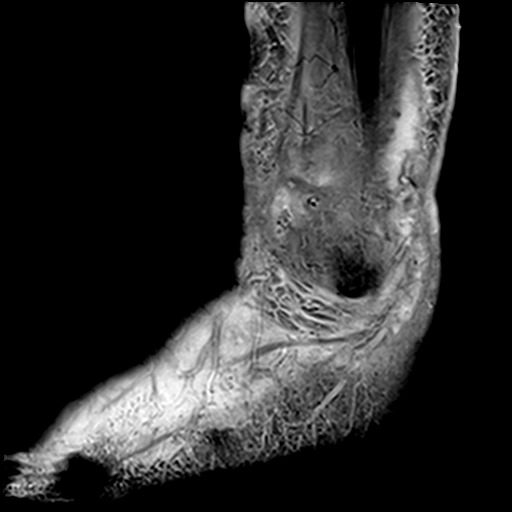
[im 9/23]
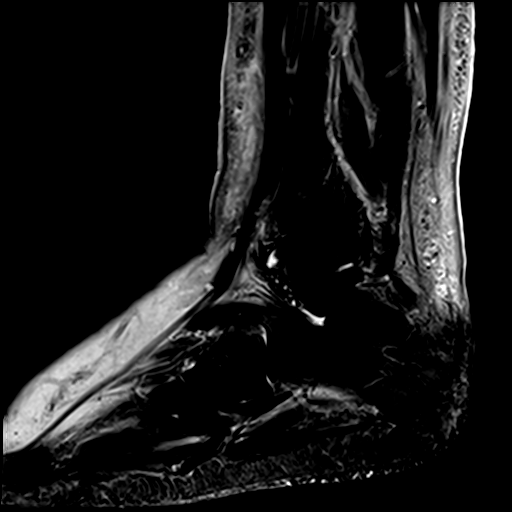
[im 14/23]
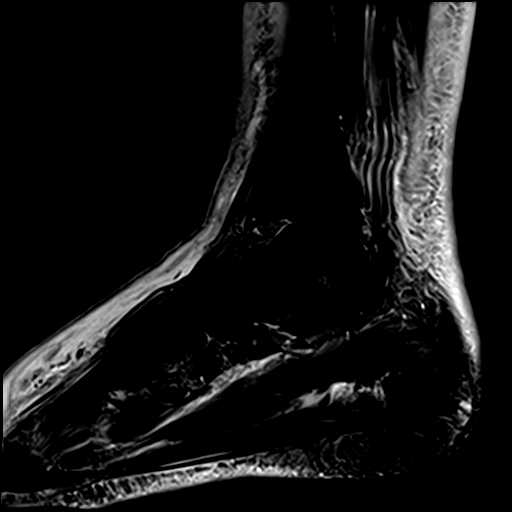
[im 18/23]
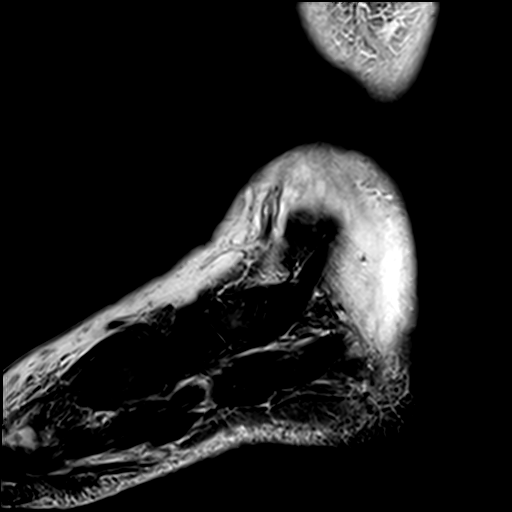
[im 23/23]
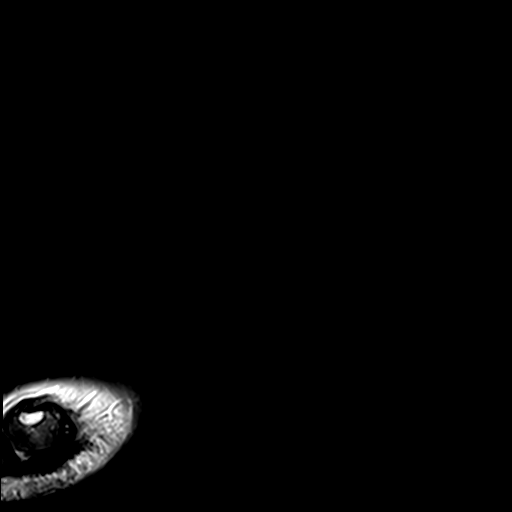

[40 of 40 positions shown; findings below may reference images not displayed]

FINDINGS: TENDONS

Peroneal: Tendinosis with partial interstitial tear of the
retro-malleolar and proximal infra-malleolar portions of the
peroneus longus tendon (series 4, images 16-21). Peroneus brevis
tendon appears intact without evidence of tear. Trace tenosynovial
fluid.

Posteromedial: Intact tibialis posterior, flexor hallucis longus and
flexor digitorum longus tendons.

Anterior: Intact tibialis anterior, extensor hallucis longus and
extensor digitorum longus tendons.

Achilles: Intact.

Plantar Fascia: Intact.

LIGAMENTS

Lateral: Anterior talofibular ligament is attenuated, likely
reflecting sequela of remote trauma. Posterior talofibular and
calcaneofibular ligaments appear grossly intact. Intact anterior and
posterior tibiofibular ligaments.

Medial: Deltoid ligament and spring ligament complex intact.

CARTILAGE

Ankle Joint: No joint effusion or chondral defect.

Subtalar Joints/Sinus Tarsi: No joint effusion or chondral defect.
Preservation of the anatomic fat within the sinus tarsi.

Bones: No acute fracture. No dislocation. No cortical destruction or
periostitis. No bone marrow edema. No marrow replacing bone lesion.

Other: Marked circumferential subcutaneous edema and fluid
throughout the visualized lower leg, ankle, and foot. Developing,
partially defined lobulated fluid collection within the subcutaneous
soft tissues overlying the distal fibula in total measuring
approximately 7.5 x 1.0 x 6.5 cm (series 7, image 25; series 5,
image 5). Deep fascial edema, not out of proportion to the degree of
subcutaneous edema. No deep fascial fluid collections are seen. No
intramuscular fluid collections.
IMPRESSION: 1. Marked circumferential subcutaneous edema and fluid throughout
the visualized lower leg, ankle, and foot compatible with
cellulitis. Partially defined lobulated fluid collection within the
subcutaneous soft tissues laterally overlying the distal fibula in
total measuring 7.5 x 1.0 x 6.5 cm compatible with
phlegmon/developing abscess.
2. No evidence of osteomyelitis.
3. Tendinosis with partial interstitial tear of the peroneus longus
tendon.

## 2021-09-26 IMAGING — CT CT TIBIA FIBULA *L* W/ CM
3 of 5 series · 9 of 33 positions shown, 10 images · IV contrast (omnipaque)
Comparison: MRI 01/03/2021 and 01/02/2021. Ankle radiographs
01/01/2021 and CT 12/30/2020.

CLINICAL DATA: Left lower leg soft tissue infection suspected.

EXAM:
CT OF THE LOWER LEFT EXTREMITY WITH CONTRAST
TECHNIQUE: Multidetector CT imaging of the left lower leg was performed
according to the standard protocol following intravenous contrast
administration.
CONTRAST:  100mL OMNIPAQUE IOHEXOL 300 MG/ML  SOLN

[Series 5: coronal bone · coronal · 0.29mm/px · 1 of 101 slices shown]
[im 51/101  bone]
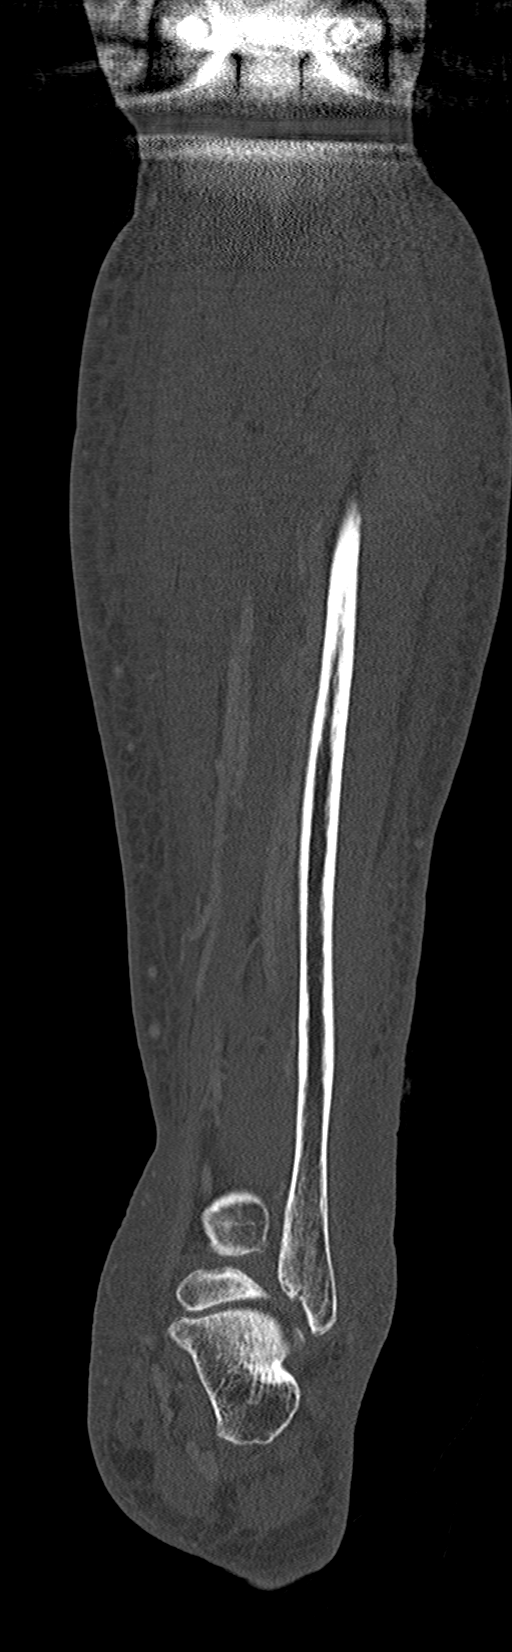

[Series 6: sagittal bone · sagittal · 0.46mm/px · 5 of 98 slices shown]
[im 17/98  bone]
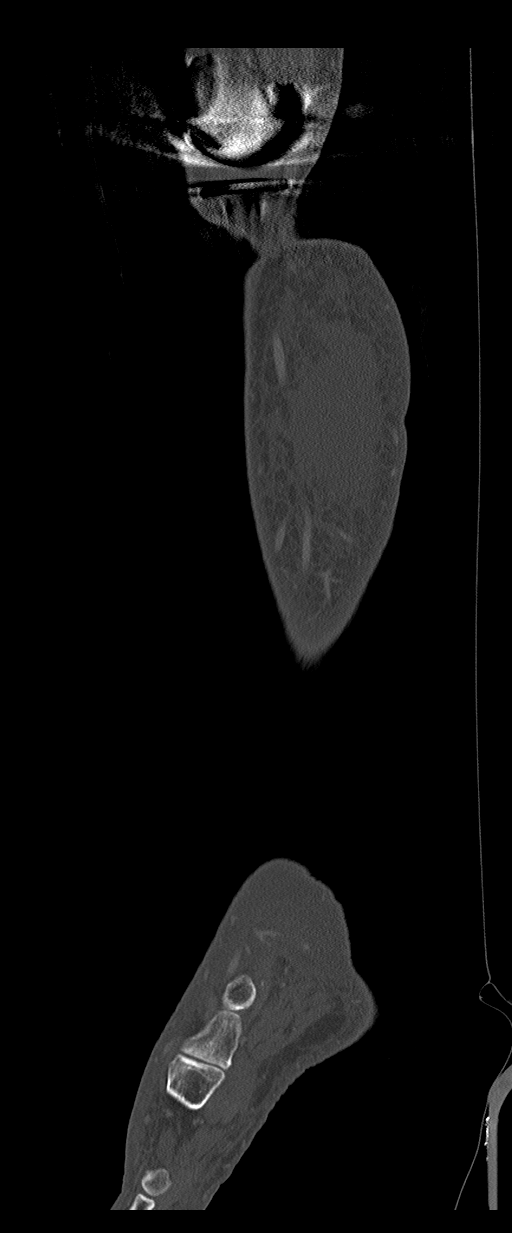
[im 33/98  bone]
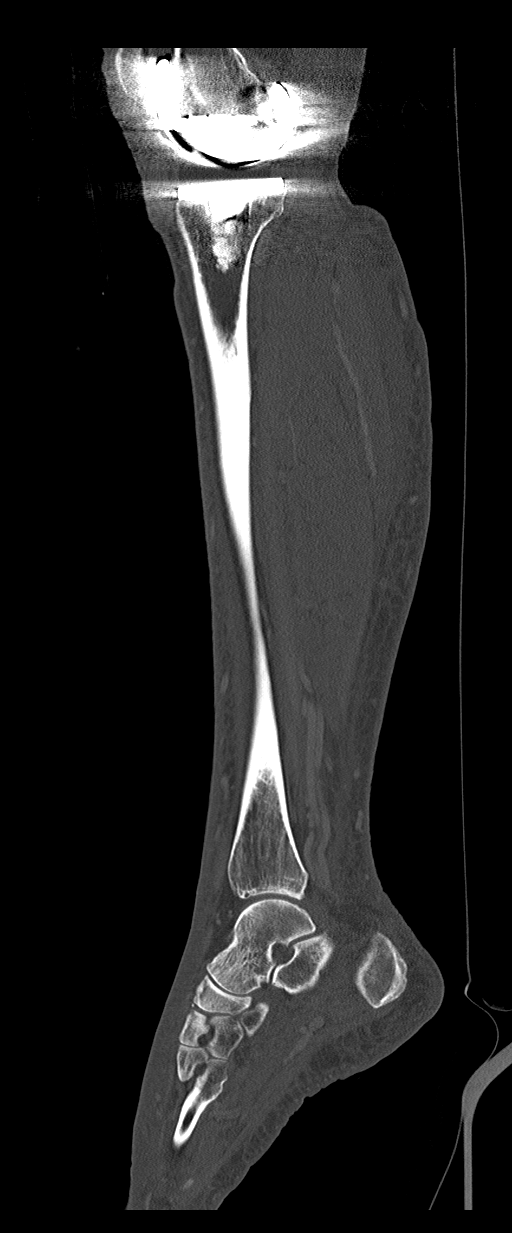
[im 49/98  bone]
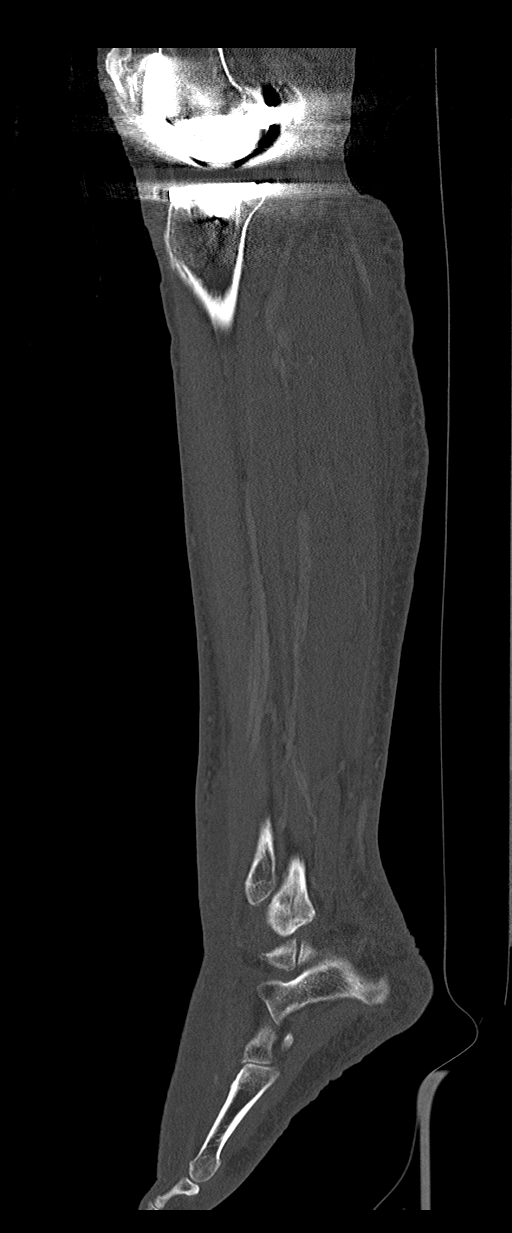
[im 65/98  bone]
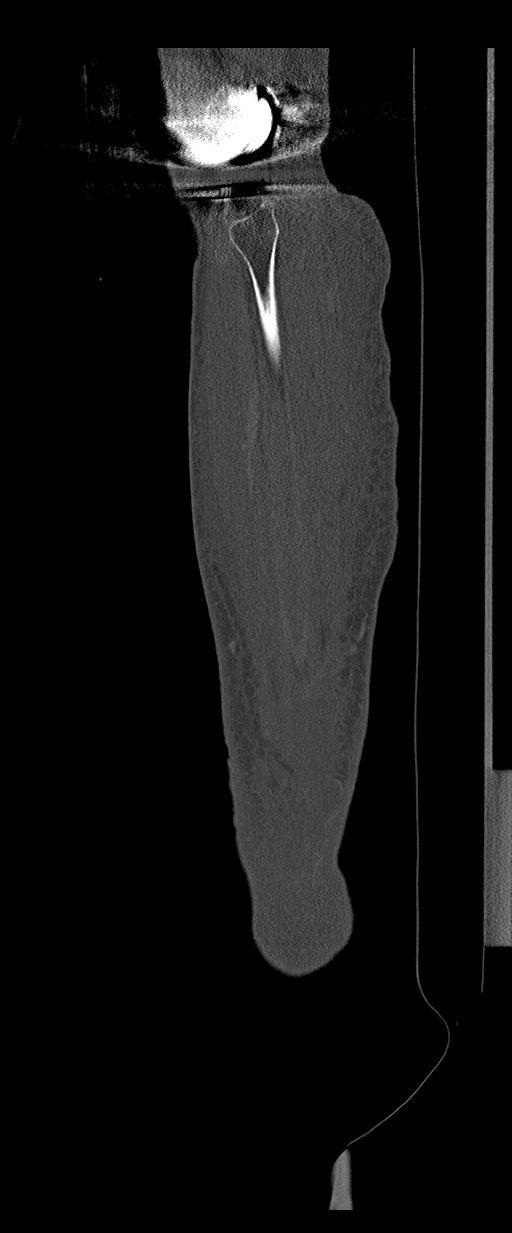
[im 81/98  bone]
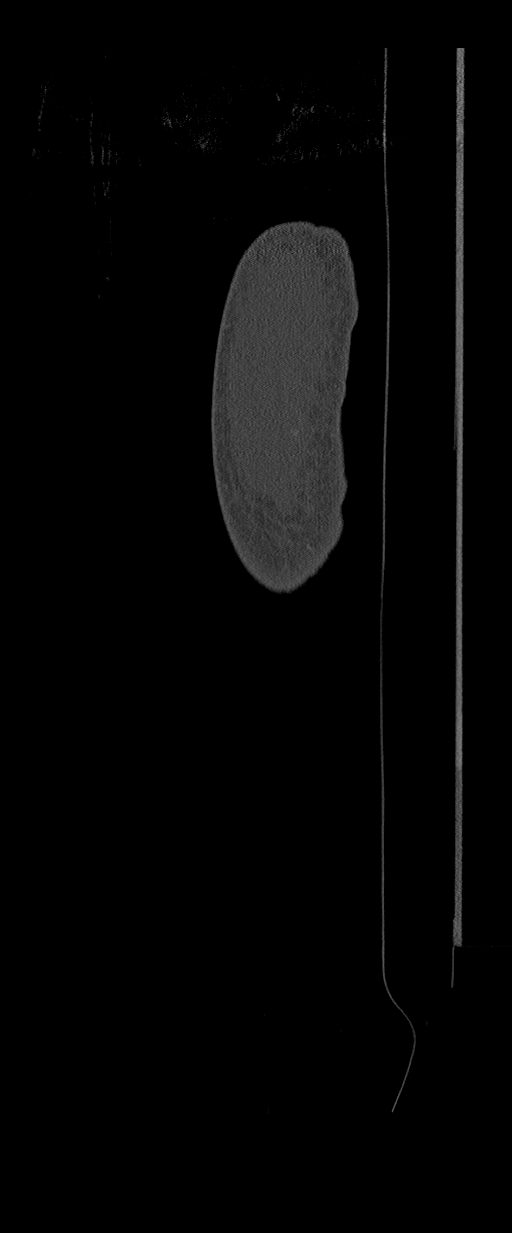

[Series 7: axial st · axial · 0.45mm/px · z∈[+312,+636]mm · 3 of 353 slices shown, 4 images]
[im 82/353  soft-tissue]
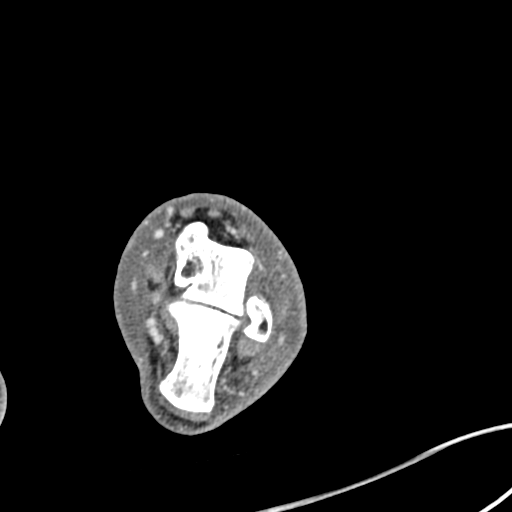
[im 82/353  bone]
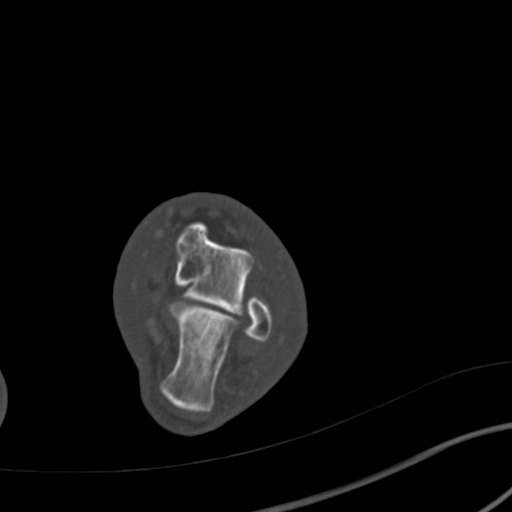
[im 190/353  bone]
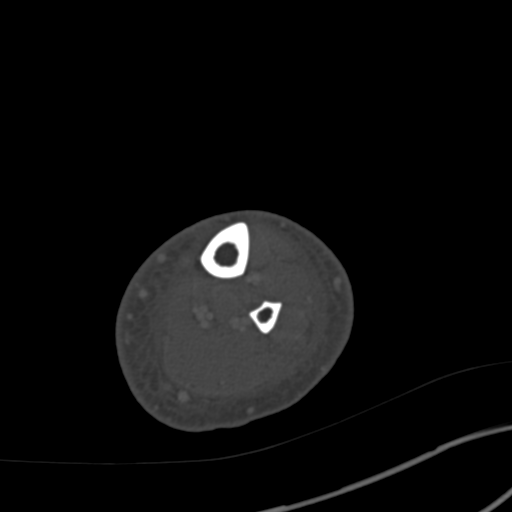
[im 298/353  bone]
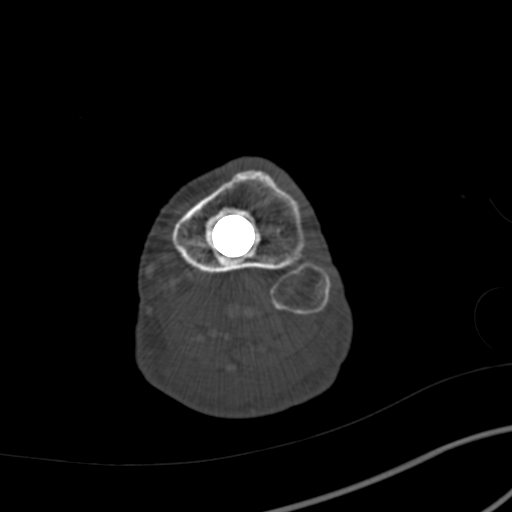

[9 of 33 positions shown; findings below may reference images not displayed]

FINDINGS: Bones/Joint/Cartilage

Images extend from the distal left femur through the left ankle.
Patient is status post left total knee arthroplasty. No evidence of
hardware loosening, acute fracture or dislocation. There is no bone
destruction. No significant knee or ankle joint effusion or abnormal
enhancement.

Ligaments

Suboptimally assessed by CT.

Muscles and Tendons

No intramuscular fluid collection or abnormal enhancement
identified. The visualized ankle tendons appear intact. The extensor
mechanism at the knee appears intact, although is partly obscured by
artifact from the total knee arthroplasty.

Soft tissues

Again demonstrated is diffuse edema and ill-defined fluid throughout
the subcutaneous fat of the left lower leg. Overall, this has mildly
worsened compared with the prior CT of 4 days prior. There is
ill-defined peripherally enhancing collection within the
subcutaneous fat of the distal lower leg anterolaterally which
measures approximately 6.1 x 1.1 cm transverse on image 244/7. This
is unchanged from the MRI performed earlier the same date. No
associated foreign body or soft tissue emphysema. No other focal
fluid collections are identified.
IMPRESSION: 1. Again demonstrated is marked circumferential subcutaneous edema
and ill-defined fluid throughout the left lower leg consistent with
soft tissue infection (cellulitis). This has mildly worsened from
previous CT of 4 days prior.
2. An ill-defined peripherally enhancing fluid collection
anterolaterally in the distal left lower leg is better seen on MRI
of the same date, and remains suspicious for an abscess.
3. No evidence of osteomyelitis or septic joint.

## 2021-09-26 IMAGING — MR MR [PERSON_NAME] LOW WO/W CM*L*
11 series · 40 of 40 positions shown · IV contrast (gadavist)
Comparison: Left ankle MRI 01/02/2021. CT lower extremity
12/30/2020

CLINICAL DATA: Left lower extremity cellulitis

EXAM:
MRI OF LOWER LEFT EXTREMITY WITHOUT AND WITH CONTRAST
TECHNIQUE: Multiplanar, multisequence MR imaging of the left tibia and fibula
was performed both before and after administration of intravenous
contrast.
CONTRAST:  5mL GADAVIST GADOBUTROL 1 MMOL/ML IV SOLN

[Series 7: composed cor t1_comp_filt · coronal · left · 5.6mm · 1.08mm/px · 2 of 16 slices shown]
[im 1/16]
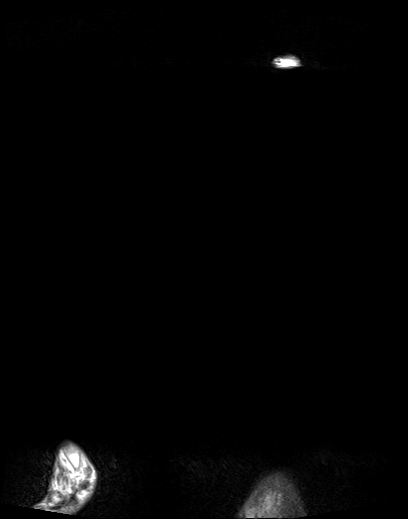
[im 16/16]
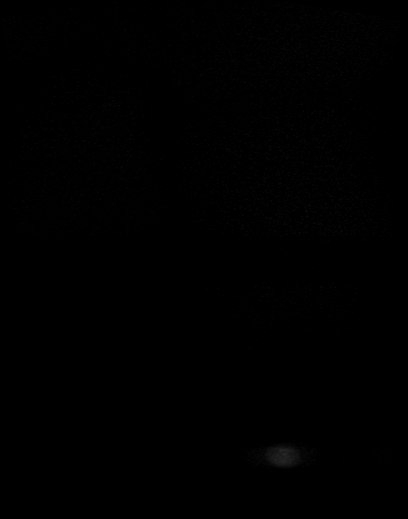

[Series 11: composed cor stir_comp_filt · coronal · left · 5.6mm · 1.08mm/px · 3 of 21 slices shown]
[im 1/21]
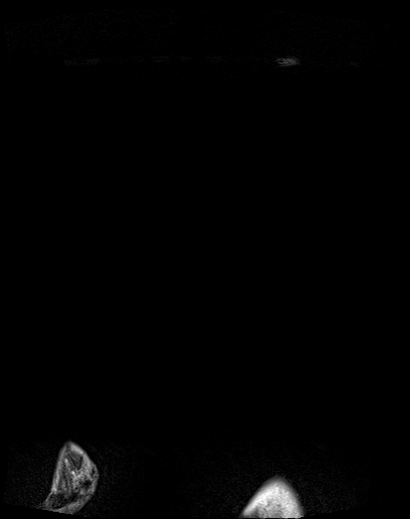
[im 11/21]
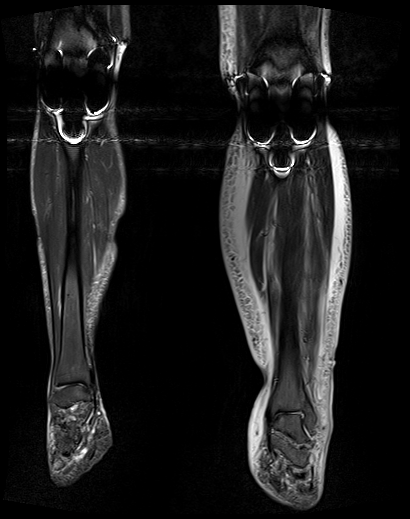
[im 21/21]
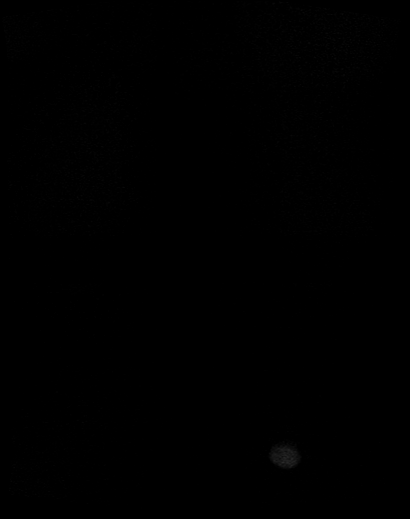

[Series 14: T2 · axial · left · 5.0mm · 0.86mm/px · z∈[-262,+254]mm · 7 of 87 slices shown]
[im 1/87]
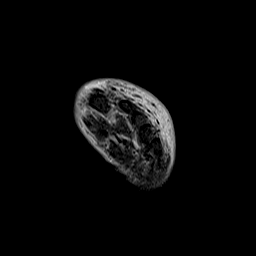
[im 15/87]
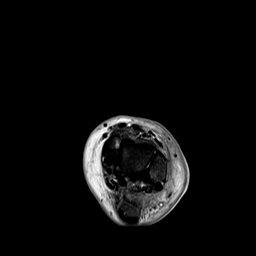
[im 29/87]
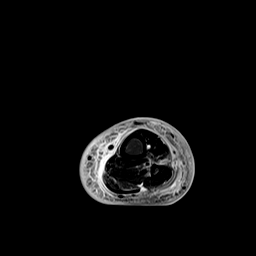
[im 44/87]
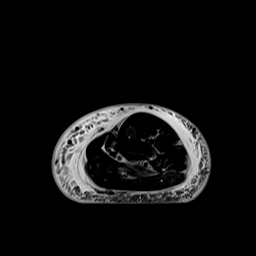
[im 58/87]
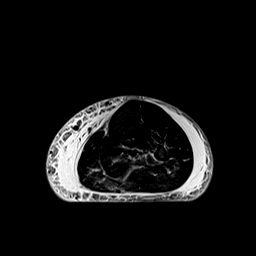
[im 72/87]
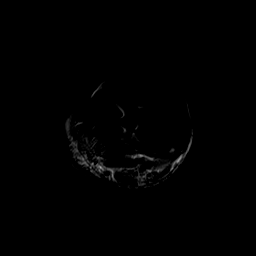
[im 87/87]
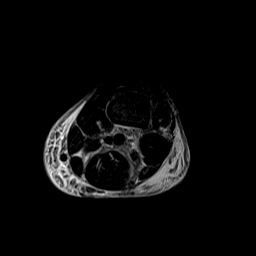

[Series 15: T1 · axial · left · 5.0mm · 0.86mm/px · z∈[-28,+254]mm · 4 of 48 slices shown (1 of 4)]
[im 1/48]
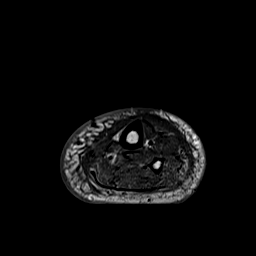
[im 16/48]
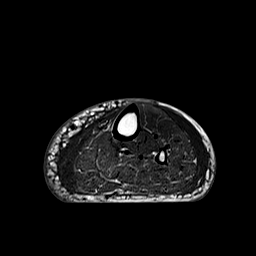
[im 32/48]
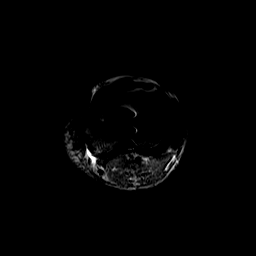
[im 48/48]
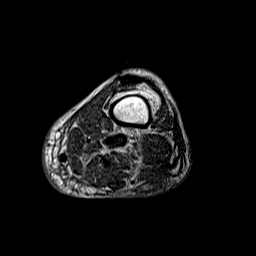

[Series 16: T1 · axial · left · 5.0mm · 0.86mm/px · z∈[-28,+254]mm · 4 of 48 slices shown (2 of 4)]
[im 1/48]
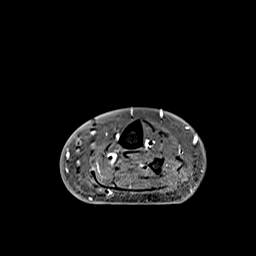
[im 16/48]
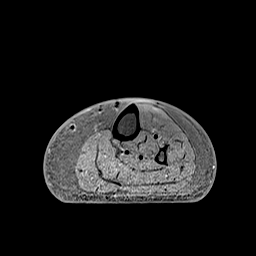
[im 32/48]
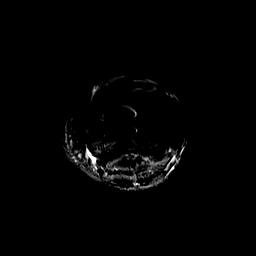
[im 48/48]
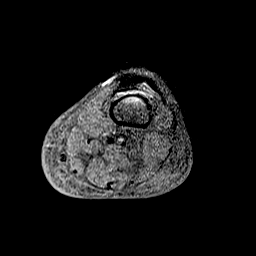

[Series 17: T1 · axial · left · 5.0mm · 0.86mm/px · z∈[-28,+254]mm · 4 of 48 slices shown (3 of 4)]
[im 1/48]
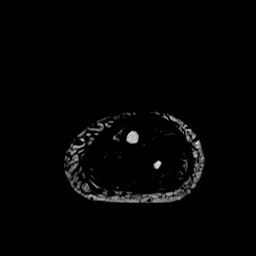
[im 16/48]
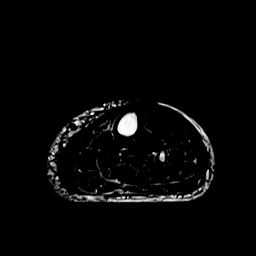
[im 32/48]
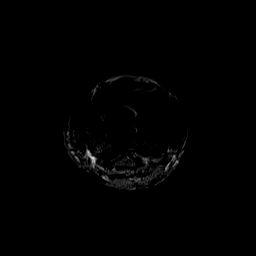
[im 48/48]
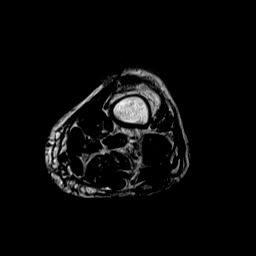

[Series 20: T1 · axial · left · 5.0mm · 0.86mm/px · z∈[-262,+20]mm · 4 of 48 slices shown (4 of 4)]
[im 1/48]
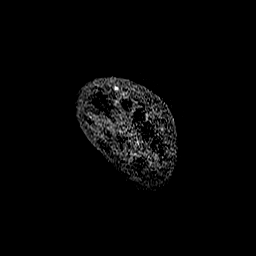
[im 16/48]
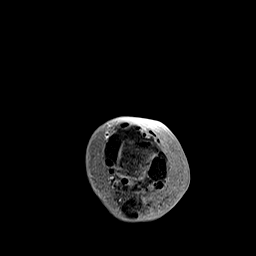
[im 32/48]
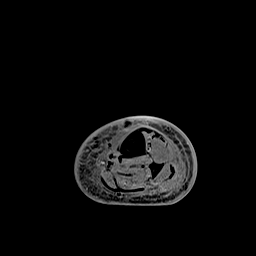
[im 48/48]
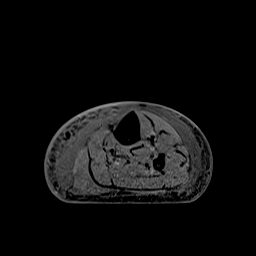

[Series 23: t2_tse_sag fs_comp · sagittal · left · 5.6mm · 0.94mm/px · 2 of 29 slices shown]
[im 1/29]
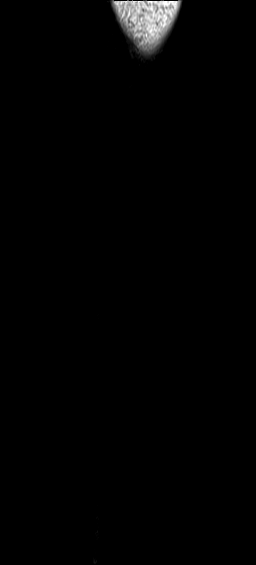
[im 29/29]
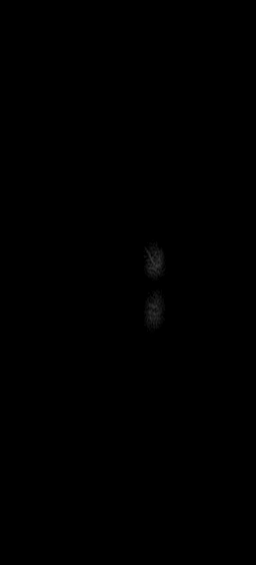

[Series 27: T1 fat-sat · axial · left · 5.0mm · 0.86mm/px · z∈[-164,+118]mm · 4 of 48 slices shown (1 of 3)]
[im 1/48]
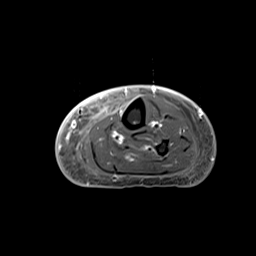
[im 16/48]
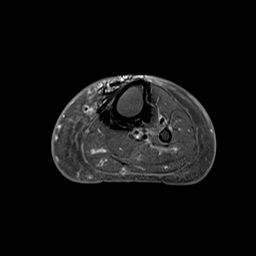
[im 32/48]
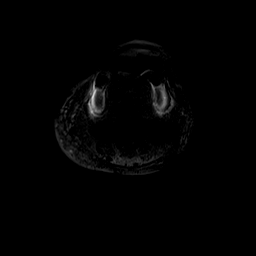
[im 48/48]
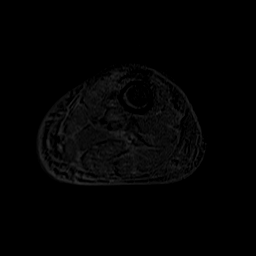

[Series 29: T1 fat-sat · axial · left · 5.0mm · 0.86mm/px · z∈[-398,-116]mm · 4 of 48 slices shown (2 of 3)]
[im 1/48]
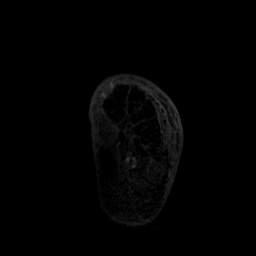
[im 16/48]
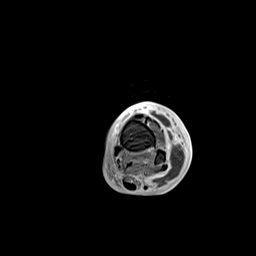
[im 32/48]
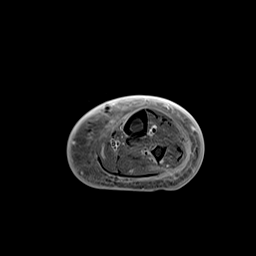
[im 48/48]
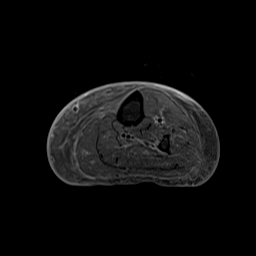

[Series 35: T1 fat-sat · sagittal · left · 5.6mm · 0.94mm/px · 2 of 24 slices shown (3 of 3)]
[im 1/24]
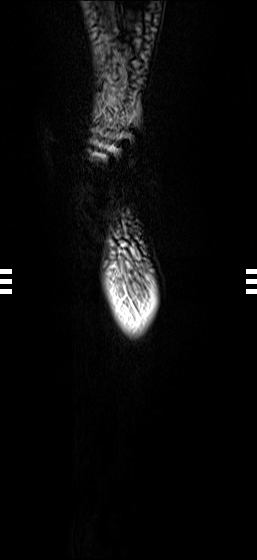
[im 24/24]
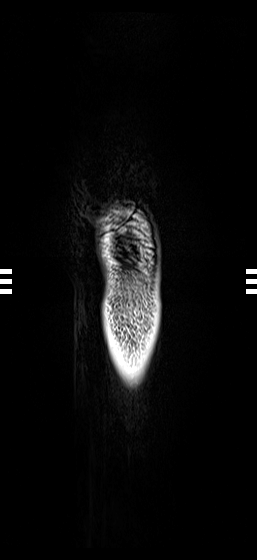

[40 of 40 positions shown; findings below may reference images not displayed]

FINDINGS: Bones/Joint/Cartilage

Susceptibility artifact from left total knee arthroplasty. No
evidence of acute fracture. No dislocation. No cortical destruction
or periosteal elevation. No evidence of osteomyelitis. Please refer
to MRI report 01/02/2021 for detailed evaluation of the ankle.

Ligaments

No acute ligamentous injury.

Muscles and Tendons

Mild lower leg intramuscular edema most pronounced within the medial
head of the gastrocnemius muscle. No intramuscular fluid collection.
No new tendinous abnormality.

Soft tissues

Lobulated thick walled, peripherally enhancing abscess overlying the
lateral aspect of the lower leg measuring approximately 13.0 x 1.3 x
6.4 cm (series 29, image 34; series 35, image 21). Collection
closely approximates the overlying skin surface at the level of the
distal fibular diaphysis (series 29, image 30). Marked surrounding
soft tissue edema and enhancement compatible with cellulitis.
Circumferential edema and fluid are seen throughout the left lower
extremity which extends into the distal thigh to a lesser extent
(series 11, image 9). There is mild deep fascial edema and
enhancement. No deep fascial fluid collections.
IMPRESSION: 1. Elongated complex peripherally enhancing abscess overlying the
lateral aspect of the lower leg measuring approximately 13.0 x 1.3 x
6.4 cm. Marked surrounding soft tissue edema and enhancement
compatible with cellulitis. Mild deep fascial edema and enhancement
suggesting fasciitis.
2. No evidence of acute osteomyelitis.

## 2021-09-28 IMAGING — DX DG SHOULDER 2+V PORT*R*
2 series · 2 of 2 positions shown · non-contrast
Comparison: Humerus radiographs dated 02/18/2012.

CLINICAL DATA: Right shoulder pain.

EXAM:
PORTABLE RIGHT SHOULDER

[shoulder ap]
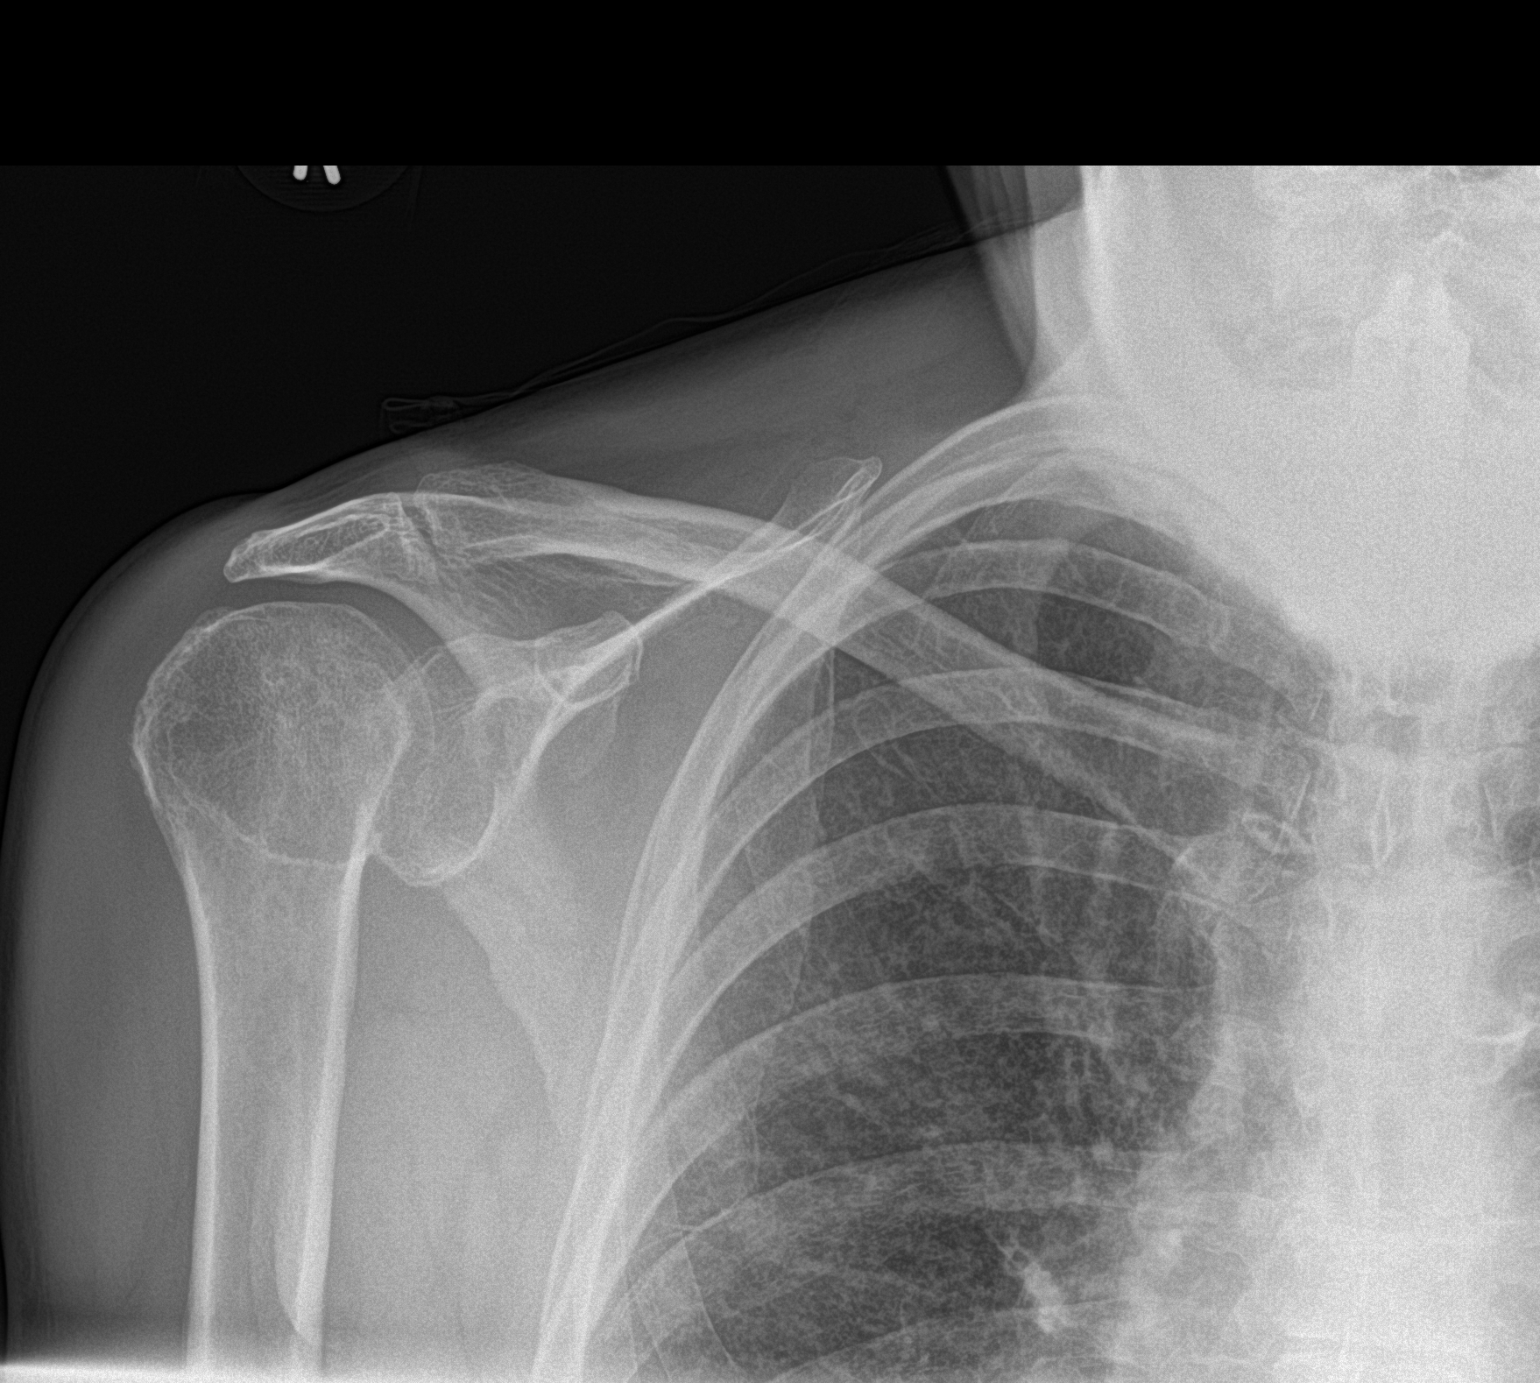

[shoulder axial]
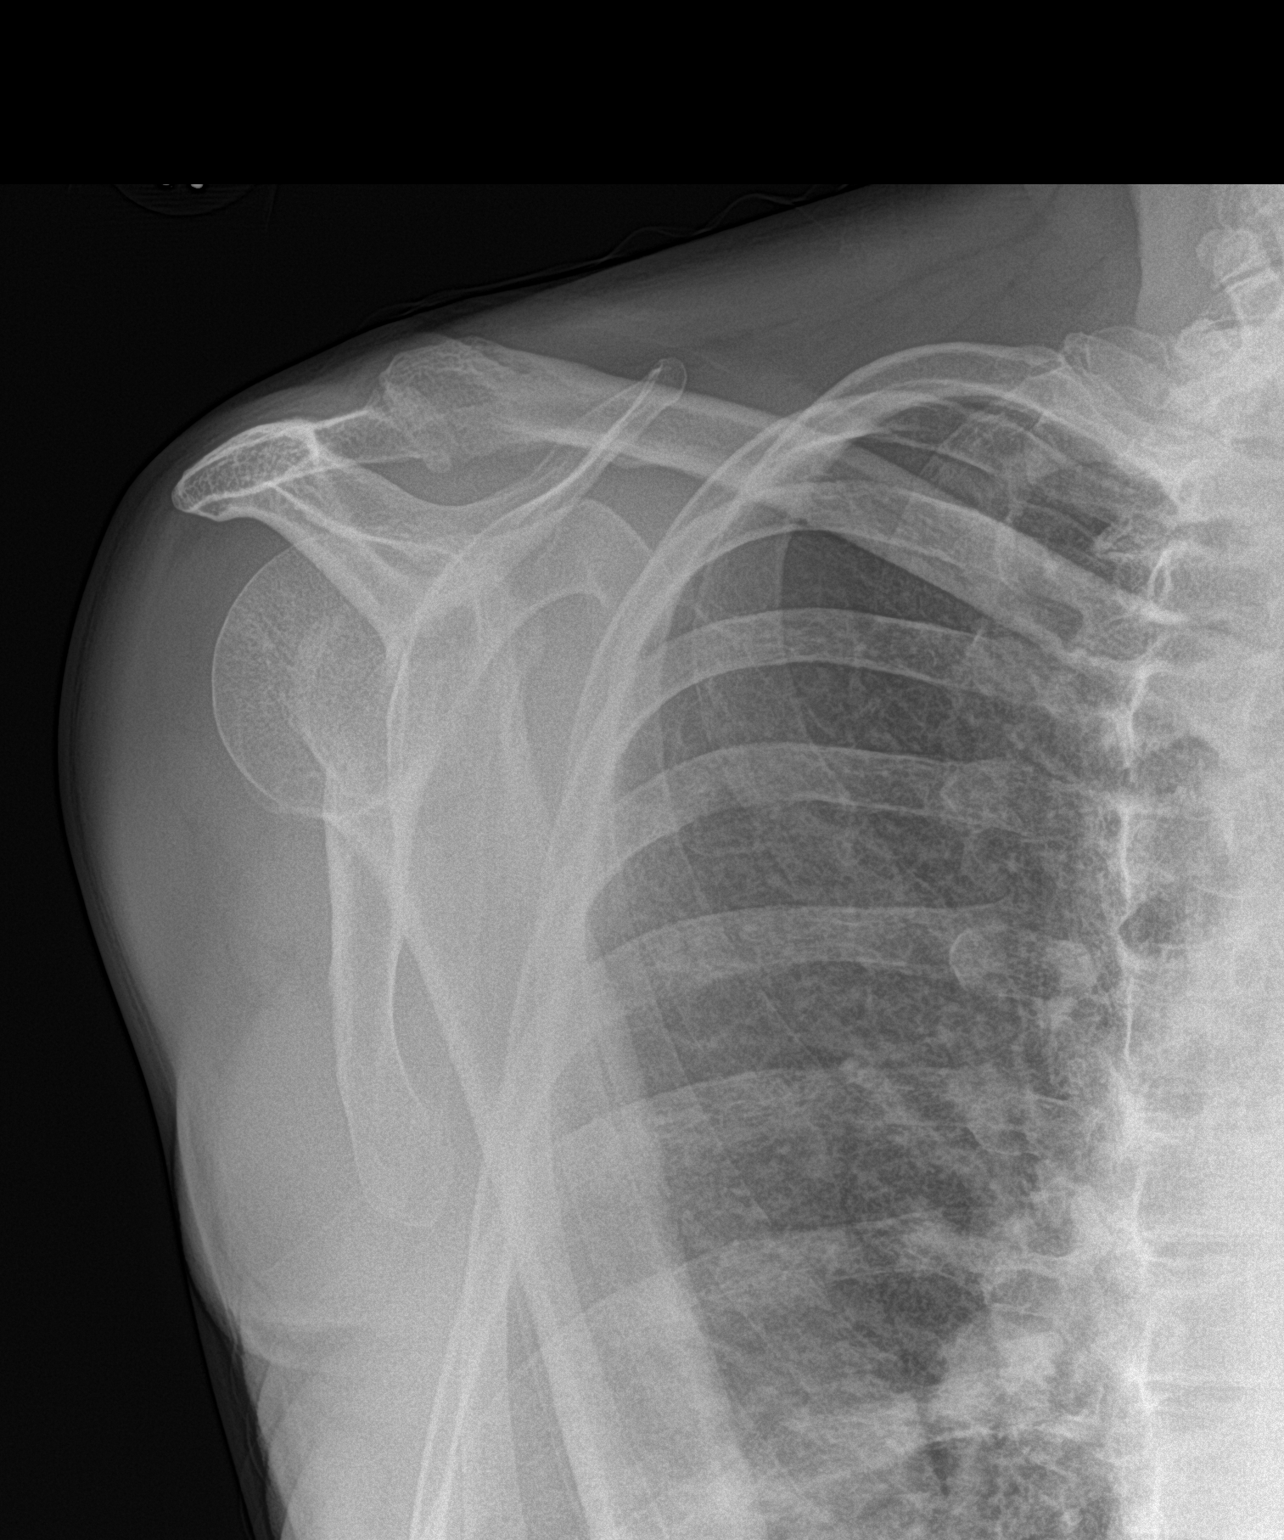

[2 of 2 positions shown; findings below may reference images not displayed]

FINDINGS: There is no evidence of fracture or dislocation. There are mild
degenerative changes of the acromioclavicular joint. Soft tissues
are unremarkable.
IMPRESSION: No acute osseous injury.
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
## 2022-03-16 ENCOUNTER — Emergency Department: Payer: Medicaid Other

## 2022-03-16 ENCOUNTER — Other Ambulatory Visit: Payer: Self-pay

## 2022-03-16 ENCOUNTER — Emergency Department
Admission: EM | Admit: 2022-03-16 | Discharge: 2022-03-16 | Disposition: A | Discharge status: Home / Self Care | Payer: Medicaid Other | Attending: Emergency Medicine | Admitting: Emergency Medicine

## 2022-03-16 DIAGNOSIS — R519 Headache, unspecified: Secondary | ICD-10-CM | POA: Diagnosis present

## 2022-03-16 DIAGNOSIS — G44319 Acute post-traumatic headache, not intractable: Secondary | ICD-10-CM | POA: Insufficient documentation

## 2022-03-16 DIAGNOSIS — Z23 Encounter for immunization: Secondary | ICD-10-CM | POA: Diagnosis not present

## 2022-03-16 MED ORDER — PROCHLORPERAZINE MALEATE 10 MG PO TABS
10.0000 mg | ORAL_TABLET | Freq: Once | ORAL | Status: AC
  Administered 2022-03-16: 10 mg via ORAL
  Filled 2022-03-16: qty 1

## 2022-03-16 MED ORDER — PROCHLORPERAZINE MALEATE 10 MG PO TABS: 10.0000 mg | ORAL_TABLET | Freq: Four times a day (QID) | ORAL | 0 refills | Status: DC | PRN

## 2022-03-16 MED ORDER — TETANUS-DIPHTH-ACELL PERTUSSIS 5-2.5-18.5 LF-MCG/0.5 IM SUSY
0.5000 mL | PREFILLED_SYRINGE | Freq: Once | INTRAMUSCULAR | Status: AC
  Administered 2022-03-16: 0.5 mL via INTRAMUSCULAR
  Filled 2022-03-16: qty 0.5
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
## 2022-03-16 NOTE — ED Provider Notes (Signed)
-----------------------------------------   11:01 AM on 03/16/2022 -----------------------------------------  Blood pressure (!) 141/96, pulse 71, temperature 98.7 F (37.1 C), temperature source Oral, resp. rate 15, height 5' (1.524 m), weight 55.4 kg, SpO2 94 %.  Assuming care from [facility]  In short, Jamie Schultz is a 61 y.o. female with a chief complaint of Headache .  Refer to the original H&P for additional details.  The current plan of care is to follow-up CT head for ongoing headache following recent trauma.  ----------------------------------------- 12:06 PM on 03/16/2022 ----------------------------------------- CT head is negative for acute process, no signs of infection on exam and areas of trauma appear to be healing well.  Patient is appropriate for discharge home with PCP follow-up, will be prescribed Compazine for use as needed for headache.  She was counseled to return to the ED for new or worsening symptoms, patient agrees with plan.    Blake Divine, MD 03/16/22 [phone]

## 2022-06-01 ENCOUNTER — Other Ambulatory Visit: Payer: Self-pay

## 2022-06-01 ENCOUNTER — Emergency Department: Payer: Medicaid Other

## 2022-06-01 ENCOUNTER — Emergency Department
Admission: EM | Admit: 2022-06-01 | Discharge: 2022-06-01 | Disposition: A | Discharge status: Home / Self Care | Payer: Medicaid Other | Attending: Emergency Medicine | Admitting: Emergency Medicine

## 2022-06-01 DIAGNOSIS — E039 Hypothyroidism, unspecified: Secondary | ICD-10-CM | POA: Diagnosis not present

## 2022-06-01 DIAGNOSIS — W1789XA Other fall from one level to another, initial encounter: Secondary | ICD-10-CM | POA: Insufficient documentation

## 2022-06-01 DIAGNOSIS — J449 Chronic obstructive pulmonary disease, unspecified: Secondary | ICD-10-CM | POA: Insufficient documentation

## 2022-06-01 DIAGNOSIS — S6991XA Unspecified injury of right wrist, hand and finger(s), initial encounter: Secondary | ICD-10-CM

## 2022-06-01 DIAGNOSIS — S60931A Unspecified superficial injury of right thumb, initial encounter: Secondary | ICD-10-CM | POA: Diagnosis not present

## 2022-06-01 DIAGNOSIS — S4991XA Unspecified injury of right shoulder and upper arm, initial encounter: Secondary | ICD-10-CM | POA: Diagnosis present

## 2022-06-01 DIAGNOSIS — W19XXXA Unspecified fall, initial encounter: Secondary | ICD-10-CM

## 2022-06-01 MED ORDER — OXYCODONE HCL 10 MG PO TABS: 10.0000 mg | ORAL_TABLET | Freq: Four times a day (QID) | ORAL | 0 refills | Status: AC | PRN

## 2022-06-01 MED ORDER — MORPHINE SULFATE (PF) 4 MG/ML IV SOLN
4.0000 mg | Freq: Once | INTRAVENOUS | Status: AC
  Administered 2022-06-01: 4 mg via INTRAMUSCULAR
  Filled 2022-06-01: qty 1

## 2022-06-01 MED ORDER — DIAZEPAM 5 MG PO TABS: 5.0000 mg | ORAL_TABLET | Freq: Three times a day (TID) | ORAL | 0 refills | Status: AC | PRN
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
## 2022-06-01 NOTE — ED Triage Notes (Addendum)
Patient states three days ago she slipped and fell down four steps and now with right thumb pain right shoulder pain and back pain. States today she tripped up the stairs today, denies injuries from fall today. Reports history of multiple back surgeries with hardware. AOX4. Resp even, unlabored on RA.

## 2022-06-01 NOTE — Discharge Instructions (Signed)
You likely have a sprain or tear in the right thumb.  Keep the splint on until you follow-up with an orthopedist.  We have given you referral to a hand surgeon.  You also may have injured the rotator cuff in your right shoulder.  There are no signs of glass in the x-rays of the foot.  Take the Percocet only if needed for severe pain and the Valium as a muscle relaxant over the next couple of days.  Return to the ER for new, worsening, or persistent severe pain, weakness or numbness, increasing cramping, swelling, or any other new or worsening symptoms that concern you.

## 2022-12-29 ENCOUNTER — Emergency Department: Payer: Medicaid Other

## 2022-12-29 ENCOUNTER — Inpatient Hospital Stay
Admission: EM | Admit: 2022-12-29 | Discharge: 2023-01-05 | DRG: 177 | Disposition: A | Discharge status: Home / Self Care | Payer: Medicaid Other | Attending: Osteopathic Medicine | Admitting: Osteopathic Medicine

## 2022-12-29 ENCOUNTER — Other Ambulatory Visit: Payer: Self-pay

## 2022-12-29 ENCOUNTER — Other Ambulatory Visit: Payer: Medicaid Other

## 2022-12-29 DIAGNOSIS — Z885 Allergy status to narcotic agent status: Secondary | ICD-10-CM

## 2022-12-29 DIAGNOSIS — J432 Centrilobular emphysema: Secondary | ICD-10-CM

## 2022-12-29 DIAGNOSIS — Z886 Allergy status to analgesic agent status: Secondary | ICD-10-CM

## 2022-12-29 DIAGNOSIS — R0902 Hypoxemia: Secondary | ICD-10-CM | POA: Diagnosis present

## 2022-12-29 DIAGNOSIS — Z801 Family history of malignant neoplasm of trachea, bronchus and lung: Secondary | ICD-10-CM

## 2022-12-29 DIAGNOSIS — Z825 Family history of asthma and other chronic lower respiratory diseases: Secondary | ICD-10-CM

## 2022-12-29 DIAGNOSIS — Z8249 Family history of ischemic heart disease and other diseases of the circulatory system: Secondary | ICD-10-CM

## 2022-12-29 DIAGNOSIS — U071 COVID-19: Secondary | ICD-10-CM | POA: Diagnosis present

## 2022-12-29 DIAGNOSIS — Z888 Allergy status to other drugs, medicaments and biological substances status: Secondary | ICD-10-CM

## 2022-12-29 DIAGNOSIS — Z880 Allergy status to penicillin: Secondary | ICD-10-CM

## 2022-12-29 DIAGNOSIS — F419 Anxiety disorder, unspecified: Secondary | ICD-10-CM | POA: Diagnosis present

## 2022-12-29 DIAGNOSIS — J069 Acute upper respiratory infection, unspecified: Secondary | ICD-10-CM | POA: Diagnosis present

## 2022-12-29 DIAGNOSIS — Z87891 Personal history of nicotine dependence: Secondary | ICD-10-CM

## 2022-12-29 DIAGNOSIS — Z823 Family history of stroke: Secondary | ICD-10-CM

## 2022-12-29 DIAGNOSIS — Z981 Arthrodesis status: Secondary | ICD-10-CM

## 2022-12-29 DIAGNOSIS — F22 Delusional disorders: Secondary | ICD-10-CM | POA: Diagnosis present

## 2022-12-29 DIAGNOSIS — J441 Chronic obstructive pulmonary disease with (acute) exacerbation: Secondary | ICD-10-CM | POA: Diagnosis present

## 2022-12-29 DIAGNOSIS — F603 Borderline personality disorder: Secondary | ICD-10-CM | POA: Diagnosis present

## 2022-12-29 DIAGNOSIS — Z96653 Presence of artificial knee joint, bilateral: Secondary | ICD-10-CM | POA: Diagnosis present

## 2022-12-29 DIAGNOSIS — M542 Cervicalgia: Secondary | ICD-10-CM | POA: Diagnosis present

## 2022-12-29 DIAGNOSIS — Z7951 Long term (current) use of inhaled steroids: Secondary | ICD-10-CM

## 2022-12-29 DIAGNOSIS — Z96612 Presence of left artificial shoulder joint: Secondary | ICD-10-CM | POA: Diagnosis present

## 2022-12-29 DIAGNOSIS — Z8614 Personal history of Methicillin resistant Staphylococcus aureus infection: Secondary | ICD-10-CM

## 2022-12-29 DIAGNOSIS — Z882 Allergy status to sulfonamides status: Secondary | ICD-10-CM

## 2022-12-29 DIAGNOSIS — Z881 Allergy status to other antibiotic agents status: Secondary | ICD-10-CM

## 2022-12-29 DIAGNOSIS — Z91030 Bee allergy status: Secondary | ICD-10-CM

## 2022-12-29 DIAGNOSIS — E039 Hypothyroidism, unspecified: Secondary | ICD-10-CM | POA: Diagnosis present

## 2022-12-29 DIAGNOSIS — R462 Strange and inexplicable behavior: Secondary | ICD-10-CM | POA: Diagnosis present

## 2022-12-29 DIAGNOSIS — J439 Emphysema, unspecified: Secondary | ICD-10-CM | POA: Diagnosis present

## 2022-12-29 DIAGNOSIS — Z91041 Radiographic dye allergy status: Secondary | ICD-10-CM

## 2022-12-29 DIAGNOSIS — R451 Restlessness and agitation: Secondary | ICD-10-CM

## 2022-12-29 DIAGNOSIS — F3341 Major depressive disorder, recurrent, in partial remission: Secondary | ICD-10-CM | POA: Diagnosis present

## 2022-12-29 DIAGNOSIS — Z79890 Hormone replacement therapy (postmenopausal): Secondary | ICD-10-CM

## 2022-12-29 DIAGNOSIS — F17200 Nicotine dependence, unspecified, uncomplicated: Secondary | ICD-10-CM | POA: Diagnosis present

## 2022-12-29 DIAGNOSIS — Z79899 Other long term (current) drug therapy: Secondary | ICD-10-CM

## 2022-12-29 DIAGNOSIS — F418 Other specified anxiety disorders: Secondary | ICD-10-CM | POA: Diagnosis present

## 2022-12-29 DIAGNOSIS — Z91048 Other nonmedicinal substance allergy status: Secondary | ICD-10-CM

## 2022-12-29 DIAGNOSIS — F988 Other specified behavioral and emotional disorders with onset usually occurring in childhood and adolescence: Secondary | ICD-10-CM | POA: Diagnosis present

## 2022-12-29 DIAGNOSIS — G8929 Other chronic pain: Secondary | ICD-10-CM | POA: Diagnosis present

## 2022-12-29 DIAGNOSIS — G929 Unspecified toxic encephalopathy: Secondary | ICD-10-CM | POA: Diagnosis present

## 2022-12-29 DIAGNOSIS — M797 Fibromyalgia: Secondary | ICD-10-CM | POA: Diagnosis present

## 2022-12-29 DIAGNOSIS — J449 Chronic obstructive pulmonary disease, unspecified: Secondary | ICD-10-CM | POA: Diagnosis present

## 2022-12-29 LAB — CBC
HCT: 37.8 % (ref 36.0–46.0)
Hemoglobin: 12.2 g/dL (ref 12.0–15.0)
MCH: 30.2 pg (ref 26.0–34.0)
MCHC: 32.3 g/dL (ref 30.0–36.0)
MCV: 93.6 fL (ref 80.0–100.0)
Platelets: 364 10*3/uL (ref 150–400)
RBC: 4.04 MIL/uL (ref 3.87–5.11)
RDW: 14.7 % (ref 11.5–15.5)
WBC: 8.3 10*3/uL (ref 4.0–10.5)
nRBC: 0 % (ref 0.0–0.2)

## 2022-12-29 LAB — COMPREHENSIVE METABOLIC PANEL
ALT: 23 U/L (ref 0–44)
AST: 35 U/L (ref 15–41)
Albumin: 4 g/dL (ref 3.5–5.0)
Alkaline Phosphatase: 81 U/L (ref 38–126)
Anion gap: 9 (ref 5–15)
BUN: 25 mg/dL — ABNORMAL HIGH (ref 8–23)
CO2: 24 mmol/L (ref 22–32)
Calcium: 9.7 mg/dL (ref 8.9–10.3)
Chloride: 109 mmol/L (ref 98–111)
Creatinine, Ser: 0.86 mg/dL (ref 0.44–1.00)
GFR, Estimated: >60 mL/min (ref >60)
Glucose, Bld: 112 mg/dL — ABNORMAL HIGH (ref 70–99)
Potassium: 4 mmol/L (ref 3.5–5.1)
Sodium: 142 mmol/L (ref 135–145)
Total Bilirubin: 0.7 mg/dL (ref 0.3–1.2)
Total Protein: 8 g/dL (ref 6.5–8.1)

## 2022-12-29 LAB — TSH: TSH: 6.962 u[IU]/mL — ABNORMAL HIGH (ref 0.350–4.500)

## 2022-12-29 LAB — ACETAMINOPHEN LEVEL: Acetaminophen (Tylenol), Serum: <10 ug/mL — ABNORMAL LOW (ref 10–30)

## 2022-12-29 LAB — SALICYLATE LEVEL: Salicylate Lvl: <7 mg/dL — ABNORMAL LOW (ref 7.0–30.0)

## 2022-12-29 LAB — ETHANOL: Alcohol, Ethyl (B): <10 mg/dL (ref <10)

## 2022-12-29 MED ORDER — ALBUTEROL SULFATE (2.5 MG/3ML) 0.083% IN NEBU
2.5000 mg | INHALATION_SOLUTION | Freq: Four times a day (QID) | RESPIRATORY_TRACT | Status: DC | PRN
  Administered 2022-12-30: 2.5 mg via RESPIRATORY_TRACT
  Filled 2022-12-29: qty 3

## 2022-12-29 MED ORDER — ALPRAZOLAM 0.5 MG PO TABS
1.0000 mg | ORAL_TABLET | Freq: Once | ORAL | Status: AC
  Administered 2022-12-29: 1 mg via ORAL
  Filled 2022-12-29: qty 2

## 2022-12-29 MED ORDER — LEVOTHYROXINE SODIUM 50 MCG PO TABS
50.0000 ug | ORAL_TABLET | Freq: Every day | ORAL | Status: DC
  Administered 2022-12-29 – 2022-12-31 (×3): 50 ug via ORAL
  Filled 2022-12-29 (×3): qty 1

## 2022-12-29 MED ORDER — TEMAZEPAM 7.5 MG PO CAPS
15.0000 mg | ORAL_CAPSULE | Freq: Every evening | ORAL | Status: DC | PRN
  Administered 2023-01-03 – 2023-01-04 (×2): 15 mg via ORAL
  Filled 2022-12-29 (×2): qty 2

## 2022-12-29 NOTE — ED Notes (Signed)
TTS with pt now.  

## 2022-12-29 NOTE — ED Notes (Signed)
Pt was given PM snack by this NT. 
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
## 2022-12-29 NOTE — ED Triage Notes (Signed)
Pt to ED [employer] IVC, pt was speaking incoherently to police, living in home that has needles and dog poop in it.  Pt noted to have pressured speech, has to be asked questions multiple times d/t pt changing topic.  Pt noted to have wheezing, reports COPD. Denies O2 use. Reports has been out of inhalers for months.

## 2022-12-29 NOTE — ED Notes (Addendum)
Pt dressed out into hospital scrubs. Pt belongings to include: 1 red black dress 1 white panties  2 black socks 1 gray bracelet 1 yellow ring 1 gray necklace

## 2022-12-29 NOTE — ED Notes (Signed)
Pt is IVC.  Pt brought in by [employer].  Pt states she was dragged out of her home, handcuffed to come to the hospital for eval.  Pt reports she has a dog in the house and has not cleaned up.  Pt also reports drugs in the house but they are not hers and that she doesn't use drugs. Pt restless and moving about on the stretcher.  Pt cooperative.

## 2022-12-29 NOTE — ED Provider Notes (Signed)
[facility] Provider Note    Event Date/Time   First MD Initiated Contact with Patient 12/29/22 1913     (approximate)   History   No chief complaint on file.   Level Vcaveat:  AMS  HPI  Jamie Schultz is a 62 y.o. female with a history of chronic pain, major depression, emphysema presents to the ER under IVC due to bizarre behavior or agitation brought in by police.  Patient speaking in uninterpretable sentences appears agitated and hypervigilant.  Reportedly Dr. Paraphernalia found at.     Physical Exam   Triage Vital Signs: ED Triage Vitals  Enc Vitals Group     BP 12/29/22 1816 (!) 136/99     Pulse Rate 12/29/22 1816 82     Resp 12/29/22 1816 (!) 26     Temp 12/29/22 1816 98.4 F (36.9 C)     Temp src --      SpO2 12/29/22 1816 94 %     Weight 12/29/22 1818 120 lb (54.4 kg)     Height 12/29/22 1818 5' (1.524 m)     Head Circumference --      Peak Flow --      Pain Score 12/29/22 1817 0     Pain Loc --      Pain Edu? --      Excl. in GC? --     Most recent vital signs: Vitals:   12/29/22 1816  BP: (!) 136/99  Pulse: 82  Resp: (!) 26  Temp: 98.4 F (36.9 C)  SpO2: 94%     Constitutional: Alert disheveled Eyes: Conjunctivae are normal.  Head: Atraumatic. Nose: No congestion/rhinnorhea. Mouth/Throat: Mucous membranes are moist.   Neck: Painless ROM.  Cardiovascular:   Good peripheral circulation. Respiratory: Normal respiratory effort.  No retractions.  Gastrointestinal: Soft and nontender.  Musculoskeletal:  no deformity Neurologic:  MAE spontaneously. No gross focal neurologic deficits are appreciated.  Skin:  Skin is warm, dry and intact. No rash noted. Psychiatric: Anxious and acutely agitated    ED Results / Procedures / Treatments   Labs (all labs ordered are listed, but only abnormal results are displayed) Labs Reviewed  COMPREHENSIVE METABOLIC PANEL - Abnormal; Notable for the following components:       Result Value   Glucose, Bld 112 (*)    BUN 25 (*)    All other components within normal limits  SALICYLATE LEVEL - Abnormal; Notable for the following components:   Salicylate Lvl <7.0 (*)    All other components within normal limits  ACETAMINOPHEN LEVEL - Abnormal; Notable for the following components:   Acetaminophen (Tylenol), Serum <10 (*)    All other components within normal limits  TSH - Abnormal; Notable for the following components:   TSH 6.962 (*)    All other components within normal limits  ETHANOL  CBC  URINE DRUG SCREEN, QUALITATIVE ([facility] ONLY)     EKG  ED ECG REPORT I, Willy Eddy, the attending physician, personally viewed and interpreted this ECG.   Date: 12/29/2022  EKG Time: 18:25  Rate: 80  Rhythm: sinus  Axis: normal  Intervals: normal  ST&T Change: no stemi, non specific st abn    RADIOLOGY    PROCEDURES:  Critical Care performed: No  Procedures   MEDICATIONS ORDERED IN ED: Medications  levothyroxine (SYNTHROID) tablet 50 mcg (50 mcg Oral Given 12/29/22 2033)  albuterol (PROVENTIL) (2.5 MG/3ML) 0.083% nebulizer solution 2.5 mg (has no administration in time range)  temazepam (RESTORIL) capsule 15 mg (has no administration in time range)  ALPRAZolam (XANAX) tablet 1 mg (1 mg Oral Given 12/29/22 1937)     IMPRESSION / MDM / ASSESSMENT AND PLAN / ED COURSE  I reviewed the triage vital signs and the nursing notes.                              Differential diagnosis includes, but is not limited to, Psychosis, delirium, medication effect, noncompliance, polysubstance abuse, Si, Hi, depression   Pt for evaluation of agitation and presentation as described above under IVC for police..  Patient has psych history of .  Laboratory testing was ordered to evaluation for underlying electrolyte derangement or signs of underlying organic pathology to explain today's presentation.  Based on history and physical and laboratory evaluation, it  appears that the patient's presentation is 2/2 underlying psychiatric disorder and will require further evaluation and management by inpatient psychiatry.  Patient was  made an IVC PTA.  Disposition pending psychiatric evaluation.  The patient has been placed in psychiatric observation due to the need to provide a safe environment for the patient while obtaining psychiatric consultation and evaluation, as well as ongoing medical and medication management to treat the patient's condition.  The patient has been placed under full IVC at this time.       FINAL CLINICAL IMPRESSION(S) / ED DIAGNOSES   Final diagnoses:  Agitation     Rx / DC Orders   ED Discharge Orders     None        Note:  This document was prepared using Dragon voice recognition software and may include unintentional dictation errors.    Willy Eddy, MD 12/29/22 2037

## 2022-12-30 DIAGNOSIS — F3341 Major depressive disorder, recurrent, in partial remission: Secondary | ICD-10-CM

## 2022-12-30 LAB — URINE DRUG SCREEN, QUALITATIVE (ARMC ONLY)
Amphetamines, Ur Screen: POSITIVE — AB
Barbiturates, Ur Screen: NOT DETECTED
Benzodiazepine, Ur Scrn: POSITIVE — AB
Cannabinoid 50 Ng, Ur ~~LOC~~: POSITIVE — AB
Cocaine Metabolite,Ur ~~LOC~~: NOT DETECTED
MDMA (Ecstasy)Ur Screen: NOT DETECTED
Methadone Scn, Ur: NOT DETECTED
Opiate, Ur Screen: NOT DETECTED
Phencyclidine (PCP) Ur S: NOT DETECTED
Tricyclic, Ur Screen: POSITIVE — AB

## 2022-12-30 LAB — SARS CORONAVIRUS 2 BY RT PCR: SARS Coronavirus 2 by RT PCR: POSITIVE — AB

## 2022-12-30 MED ORDER — NIRMATRELVIR/RITONAVIR (PAXLOVID)TABLET
3.0000 | ORAL_TABLET | Freq: Two times a day (BID) | ORAL | Status: AC
  Administered 2022-12-30 – 2023-01-03 (×10): 3 via ORAL
  Filled 2022-12-30: qty 30

## 2022-12-30 MED ORDER — ALPRAZOLAM 0.5 MG PO TABS
2.0000 mg | ORAL_TABLET | Freq: Two times a day (BID) | ORAL | Status: DC | PRN
  Administered 2022-12-30 – 2023-01-04 (×9): 2 mg via ORAL
  Filled 2022-12-30 (×9): qty 4

## 2022-12-30 MED ORDER — ACETAMINOPHEN 325 MG PO TABS
650.0000 mg | ORAL_TABLET | ORAL | Status: DC | PRN
  Administered 2022-12-30: 650 mg via ORAL
  Filled 2022-12-30: qty 2

## 2022-12-30 MED ORDER — QUETIAPINE FUMARATE 200 MG PO TABS
400.0000 mg | ORAL_TABLET | Freq: Every day | ORAL | Status: DC
  Administered 2022-12-30 – 2023-01-04 (×6): 400 mg via ORAL
  Filled 2022-12-30 (×6): qty 2

## 2022-12-30 NOTE — ED Notes (Signed)
Report off to alex rn

## 2022-12-30 NOTE — ED Notes (Signed)
Report given to Amy, RN.

## 2022-12-30 NOTE — ED Notes (Signed)
IVC/in-pt psych admit when medically cleared.

## 2022-12-30 NOTE — ED Notes (Signed)
Patient given graham crackers and apple juice 

## 2022-12-30 NOTE — BH Assessment (Signed)
Comprehensive Clinical Assessment (CCA) Note  12/30/2022 ELANTRA CAPRARA [id number] Recommendations for Services/Supports/Treatments: Consulted with Jamie Schultz., NP, who determined pt. meets inpatient criteria. Jamie Schultz is a 62 year old, English speaking, Caucasian female with a hx of depression and anxiety. Per triage note: Pt to ED [facility] IVC, pt. was speaking incoherently to police, living in home that has needles and dog poop in it.    On assessment, pt. was visibly in distressed, reporting that she was having muscle spasms. Pt had poor concentration and restless motor activity. pt. presented with slurred, hoarse unintelligible speech. Pt's thoughts were somewhat relevant and pt. had tangential, pressured speech. Pt was attempting to speak at a rapid rate. Pt was unable to answer any assessment questions due to losing her voice. When asked what brought pt. to the ED the pt. mumbled disjointed statements, incoherently. Pt described having grief emotions due to losing her mother 17 years ago. Pt also identified that her husband selling/using drugs in the home as a stressor. Pt had limited insight and impaired judgment. Pt had a labile affect, and a dysphoric mood. Pt denied current SI/HI or AV/H.   Chief Complaint: No chief complaint on file.  Visit Diagnosis: Chronic neck pain (Primary Area of Pain) (Bilateral) (L>R)   Chronic left shoulder pain (Fourth Area of Pain)   Smoker   Anxiety   ADD (attention deficit disorder)    CCA Screening, Triage and Referral (STR)  Patient Reported Information How did you hear about [place]? Family/Friend  Referral name: No data recorded Referral phone number: No data recorded  Whom do you see for routine medical problems? No data recorded Practice/Facility Name: No data recorded Practice/Facility Phone Number: No data recorded Name of Contact: No data recorded Contact Number: No data recorded Contact Fax Number: No data recorded Prescriber Name: No data  recorded Prescriber Address (if known): No data recorded  What Is the Reason for Your Visit/Call Today? Pt to ED BPD IVC, pt was speaking incoherently to police, living in home that has needles and dog poop in it.   Pt noted to have pressured speech, has to be asked questions multiple times d/t pt changing topic.  How Long Has This Been Causing You Problems? > than 6 months  What Do You Feel Would Help You the Most Today? Treatment for Depression or other mood problem   Have You Recently Been in Any Inpatient Treatment (Hospital/Detox/Crisis Center/28-Day Program)? No data recorded Name/Location of Program/Hospital:No data recorded How Long Were You There? No data recorded When Were You Discharged? No data recorded  Have You Ever Received Services From [facility] Before? No data recorded Who Do You See at [facility]? No data recorded  Have You Recently Had Any Thoughts About Hurting Yourself? No  Are You Planning to Commit Suicide/Harm Yourself At This time? No   Have you Recently Had Thoughts About Hurting Someone [name]? No  Explanation: No data recorded  Have You Used Any Alcohol or Drugs in the Past 24 Hours? No  How Long Ago Did You Use Drugs or Alcohol? No data recorded What Did You Use and How Much? Pt denied substance use.   Do You Currently Have a Therapist/Psychiatrist? -- ([facility])  Name of Therapist/Psychiatrist: [facility]   Have You Been Recently Discharged From Any Office Practice or Programs? No  Explanation of Discharge From Practice/Program: N/A     CCA Screening Triage Referral Assessment Type of Contact: Face-to-Face  Is this Initial or Reassessment? No data recorded Date  Telepsych consult ordered in [facility]:  No data recorded Time Telepsych consult ordered in [facility]:  No data recorded  Patient Reported Information Reviewed? No data recorded Patient Left Without Being Seen? No data recorded Reason for Not Completing Assessment: No data recorded  Collateral  Involvement: None provided   Does Patient Have a Court Appointed Legal Guardian? No data recorded Name and Contact of Legal Guardian: No data recorded If Minor and Not Living with Parent(s), Who has Custody? n/a  Is CPS involved or ever been involved? Never  Is APS involved or ever been involved? Currently   Patient Determined To Be At Risk for Harm To Self or Others Based on Review of Patient Reported Information or Presenting Complaint? No  Method: No Plan  Availability of Means: No access or NA  Intent: Vague intent or NA  Notification Required: No need or identified person  Additional Information for Danger to Others Potential: Active psychosis  Additional Comments for Danger to Others Potential: n/a  Are There Guns or Other Weapons in Your Home? No  Types of Guns/Weapons: n/a  Are These Weapons Safely Secured?                            -- (n/a)  Who Could Verify You Are Able To Have These Secured: n/a  Do You Have any Outstanding Charges, Pending Court Dates, Parole/Probation? n/a  Contacted To Inform of Risk of Harm To Self or Others: Other: Comment   Location of Assessment: [facility] ED   Does Patient Present under Involuntary Commitment? Yes  IVC Papers Initial File Date: No data recorded  [place] of Residence: [place]   Patient Currently Receiving the Following Services: Medication Management   Determination of Need: Emergent (2 hours)   Options For Referral: Inpatient Hospitalization     CCA Biopsychosocial Intake/Chief Complaint:  No data recorded Current Symptoms/Problems: No data recorded  Patient Reported Schizophrenia/Schizoaffective Diagnosis in Past: No data recorded  Strengths: No data recorded Preferences: No data recorded Abilities: No data recorded  Type of Services Patient Feels are Needed: No data recorded  Initial Clinical Notes/Concerns: No data recorded  Mental Health Symptoms Depression:  No data recorded  Duration of  Depressive symptoms: No data recorded  Mania:  No data recorded  Anxiety:   No data recorded  Psychosis:  No data recorded  Duration of Psychotic symptoms: No data recorded  Trauma:  No data recorded  Obsessions:  No data recorded  Compulsions:  No data recorded  Inattention:  No data recorded  Hyperactivity/Impulsivity:  No data recorded  Oppositional/Defiant Behaviors:  No data recorded  Emotional Irregularity:  No data recorded  Other Mood/Personality Symptoms:  No data recorded   Mental Status Exam Appearance and self-care  Stature:  No data recorded  Weight:  No data recorded  Clothing:  No data recorded  Grooming:  No data recorded  Cosmetic use:  No data recorded  Posture/gait:  No data recorded  Motor activity:  No data recorded  Sensorium  Attention:  No data recorded  Concentration:  No data recorded  Orientation:  No data recorded  Recall/memory:  No data recorded  Affect and Mood  Affect:  No data recorded  Mood:  No data recorded  Relating  Eye contact:  No data recorded  Facial expression:  No data recorded  Attitude toward examiner:  No data recorded  Thought and Language  Speech flow: No data recorded  Thought content:  No data recorded  Preoccupation:  No data recorded  Hallucinations:  No data recorded  Organization:  No data recorded  [employer] of Knowledge:  No data recorded  Intelligence:  No data recorded  Abstraction:  No data recorded  Judgement:  No data recorded  Reality Testing:  No data recorded  Insight:  No data recorded  Decision Making:  No data recorded  Social Functioning  Social Maturity:  No data recorded  Social Judgement:  No data recorded  Stress  Stressors:  No data recorded  Coping Ability:  No data recorded  Skill Deficits:  No data recorded  Supports:  No data recorded    Religion:    Leisure/Recreation:    Exercise/Diet:     CCA Employment/Education Employment/Work Situation:     Education:     CCA Family/Childhood History Family and Relationship History:    Childhood History:     Child/Adolescent Assessment:     CCA Substance Use Alcohol/Drug Use:                           ASAM's:  Six Dimensions of Multidimensional Assessment  Dimension 1:  Acute Intoxication and/or Withdrawal Potential:      Dimension 2:  Biomedical Conditions and Complications:      Dimension 3:  Emotional, Behavioral, or Cognitive Conditions and Complications:     Dimension 4:  Readiness to Change:     Dimension 5:  Relapse, Continued use, or Continued Problem Potential:     Dimension 6:  Recovery/Living Environment:     ASAM Severity Score:    ASAM Recommended Level of Treatment:     Substance use Disorder (SUD)    Recommendations for Services/Supports/Treatments:    DSM5 Diagnoses: Patient Active Problem List   Diagnosis Date Noted   Abscess of abdominal wall 09/01/2021   Swelling of eyelid, right 09/01/2021   Anxiety 09/01/2021   ADD (attention deficit disorder) 09/01/2021   Laceration of plantar aspect of foot, left, initial encounter 09/01/2021   Cellulitis and abscess of left leg 01/01/2021   Cellulitis and abscess of foot 12/29/2020   Encephalopathy 11/13/2019   History of colonic polyps    Polyp of sigmoid colon    Rectal polyp    S/P total knee arthroplasty 02/23/2018   Primary osteoarthritis of left knee 12/19/2017   Smoker 09/21/2017   Aortic atherosclerosis (HCC) 09/18/2017   Disorder of bone, unspecified 07/27/2017   Other long term (current) drug therapy 07/27/2017   Other specified health status 07/27/2017   Chronic pain syndrome 07/27/2017   Chronic low back pain [facility] Area of Pain) (B (L>R) 07/27/2017   Chronic neck pain (Primary Area of Pain) (Bilateral) (L>R) 07/27/2017   Chronic bilateral thoracic back pain (Secondary Area of Pain) (B (L>R) 07/27/2017   Chronic left shoulder pain (Fourth Area of Pain) 07/27/2017    Chronic pain of lower extremity (Bilateral) (L>R) 07/27/2017   Bilateral chronic knee pain (B (L>R) 07/27/2017   LAD (lymphadenopathy) of right cervical region 12/16/2016   Skin lesions 12/16/2016   Family history of melanoma 12/16/2016   Swelling of both lower extremities 10/07/2016   S/P shoulder replacement 09/29/2016   Abnormality of gait 07/28/2016   Paresthesia 07/28/2016   Bilateral primary osteoarthritis of knee 07/07/2016   Prediabetes 04/24/2016   Spinal stenosis of thoracolumbar region 03/24/2016   Elevated LFTs 09/04/2015   Spondylolisthesis of lumbar region 07/31/2015   Cervical stenosis of spinal  canal 07/31/2015   Low libido 06/26/2015   Muscle wasting 06/14/2015   Nausea in adult patient 06/14/2015   Therapeutic opioid induced constipation 06/14/2015   Blood in stool    Benign neoplasm of descending colon    Rectal prolapse 04/20/2015   Chronic pain of multiple joints 04/19/2015   Left rotator cuff tear arthropathy 03/28/2015   Complete tear of left rotator cuff 01/31/2015   False positive serological test for hepatitis C 03/10/2012   Hypothyroidism 02/29/2012   Vitamin D deficiency 02/29/2012   Atrophic vaginitis 02/29/2012   Hyperlipidemia 02/11/2012   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

## 2022-12-30 NOTE — BH Assessment (Signed)
Patient is COVID +.Jamie KitchenMarland KitchenPatient was accepted to [facility] on today 12/30/22 but will have to wait 5 days and re-send referral at that time.

## 2022-12-30 NOTE — ED Notes (Signed)
Pt to [facility] with assistance   ua to lab.

## 2022-12-30 NOTE — ED Provider Notes (Signed)
Patient tested positive for covid. Unclear when symptoms started. Will start paxlovid given copd hx. No significant change in her pulse oximetry. Will continue to monitor.    Jene Every, MD 12/30/22 [phone]

## 2022-12-30 NOTE — ED Notes (Signed)
Pt denies SI/HI/AVH on assessment. Pt says she wants to go home because she has bills due today and she doesn't want to get locked out of her house

## 2022-12-30 NOTE — ED Notes (Signed)
Notified EDP Dr Cyril Loosen of Sats or 89% RA and positive covid result.

## 2022-12-30 NOTE — ED Notes (Addendum)
Patient accepted to [facility]. Report given to Thayer Ohm, [place].

## 2022-12-30 NOTE — ED Notes (Signed)
Dinner tray provided

## 2022-12-30 NOTE — BH Assessment (Signed)
Per [facility] ([place]), patient to be referred out of system.  Referral information for Psychiatric Hospitalization faxed to;   [facility] [phone]),   [facility] [phone]),  [address] [phone]),   [facility] [phone] -or- [phone]),   [facility] [phone]),  [place] [phone])  [place] [phone] or [phone]),

## 2022-12-31 ENCOUNTER — Emergency Department: Payer: Medicaid Other

## 2022-12-31 DIAGNOSIS — J069 Acute upper respiratory infection, unspecified: Secondary | ICD-10-CM

## 2022-12-31 DIAGNOSIS — G929 Unspecified toxic encephalopathy: Secondary | ICD-10-CM | POA: Diagnosis present

## 2022-12-31 DIAGNOSIS — Z79890 Hormone replacement therapy (postmenopausal): Secondary | ICD-10-CM | POA: Diagnosis not present

## 2022-12-31 DIAGNOSIS — Z882 Allergy status to sulfonamides status: Secondary | ICD-10-CM | POA: Diagnosis not present

## 2022-12-31 DIAGNOSIS — E039 Hypothyroidism, unspecified: Secondary | ICD-10-CM | POA: Diagnosis present

## 2022-12-31 DIAGNOSIS — Z888 Allergy status to other drugs, medicaments and biological substances status: Secondary | ICD-10-CM | POA: Diagnosis not present

## 2022-12-31 DIAGNOSIS — J439 Emphysema, unspecified: Secondary | ICD-10-CM

## 2022-12-31 DIAGNOSIS — R462 Strange and inexplicable behavior: Secondary | ICD-10-CM

## 2022-12-31 DIAGNOSIS — Z881 Allergy status to other antibiotic agents status: Secondary | ICD-10-CM | POA: Diagnosis not present

## 2022-12-31 DIAGNOSIS — Z8249 Family history of ischemic heart disease and other diseases of the circulatory system: Secondary | ICD-10-CM | POA: Diagnosis not present

## 2022-12-31 DIAGNOSIS — Z91041 Radiographic dye allergy status: Secondary | ICD-10-CM | POA: Diagnosis not present

## 2022-12-31 DIAGNOSIS — F22 Delusional disorders: Secondary | ICD-10-CM | POA: Diagnosis present

## 2022-12-31 DIAGNOSIS — M797 Fibromyalgia: Secondary | ICD-10-CM | POA: Diagnosis present

## 2022-12-31 DIAGNOSIS — F603 Borderline personality disorder: Secondary | ICD-10-CM | POA: Diagnosis present

## 2022-12-31 DIAGNOSIS — G8929 Other chronic pain: Secondary | ICD-10-CM | POA: Diagnosis present

## 2022-12-31 DIAGNOSIS — F418 Other specified anxiety disorders: Secondary | ICD-10-CM | POA: Diagnosis not present

## 2022-12-31 DIAGNOSIS — J441 Chronic obstructive pulmonary disease with (acute) exacerbation: Secondary | ICD-10-CM | POA: Diagnosis present

## 2022-12-31 DIAGNOSIS — Z886 Allergy status to analgesic agent status: Secondary | ICD-10-CM | POA: Diagnosis not present

## 2022-12-31 DIAGNOSIS — F419 Anxiety disorder, unspecified: Secondary | ICD-10-CM | POA: Diagnosis present

## 2022-12-31 DIAGNOSIS — Z880 Allergy status to penicillin: Secondary | ICD-10-CM | POA: Diagnosis not present

## 2022-12-31 DIAGNOSIS — F3341 Major depressive disorder, recurrent, in partial remission: Secondary | ICD-10-CM | POA: Diagnosis present

## 2022-12-31 DIAGNOSIS — U071 COVID-19: Secondary | ICD-10-CM

## 2022-12-31 DIAGNOSIS — Z91030 Bee allergy status: Secondary | ICD-10-CM | POA: Diagnosis not present

## 2022-12-31 DIAGNOSIS — Z885 Allergy status to narcotic agent status: Secondary | ICD-10-CM | POA: Diagnosis not present

## 2022-12-31 DIAGNOSIS — F988 Other specified behavioral and emotional disorders with onset usually occurring in childhood and adolescence: Secondary | ICD-10-CM | POA: Diagnosis present

## 2022-12-31 DIAGNOSIS — R451 Restlessness and agitation: Secondary | ICD-10-CM | POA: Diagnosis present

## 2022-12-31 DIAGNOSIS — Z91048 Other nonmedicinal substance allergy status: Secondary | ICD-10-CM | POA: Diagnosis not present

## 2022-12-31 DIAGNOSIS — R0902 Hypoxemia: Secondary | ICD-10-CM | POA: Diagnosis present

## 2022-12-31 LAB — CBC WITH DIFFERENTIAL/PLATELET
Abs Immature Granulocytes: 0.03 10*3/uL (ref 0.00–0.07)
Basophils Absolute: 0 10*3/uL (ref 0.0–0.1)
Basophils Relative: 1 %
Eosinophils Absolute: 0.3 10*3/uL (ref 0.0–0.5)
Eosinophils Relative: 4 %
HCT: 38.4 % (ref 36.0–46.0)
Hemoglobin: 12.2 g/dL (ref 12.0–15.0)
Immature Granulocytes: 0 %
Lymphocytes Relative: 22 %
Lymphs Abs: 1.8 10*3/uL (ref 0.7–4.0)
MCH: 29.8 pg (ref 26.0–34.0)
MCHC: 31.8 g/dL (ref 30.0–36.0)
MCV: 93.7 fL (ref 80.0–100.0)
Monocytes Absolute: 0.5 10*3/uL (ref 0.1–1.0)
Monocytes Relative: 6 %
Neutro Abs: 5.6 10*3/uL (ref 1.7–7.7)
Neutrophils Relative %: 67 %
Platelets: 322 10*3/uL (ref 150–400)
RBC: 4.1 MIL/uL (ref 3.87–5.11)
RDW: 15 % (ref 11.5–15.5)
WBC: 8.3 10*3/uL (ref 4.0–10.5)
nRBC: 0 % (ref 0.0–0.2)

## 2022-12-31 LAB — COMPREHENSIVE METABOLIC PANEL
ALT: 19 U/L (ref 0–44)
AST: 27 U/L (ref 15–41)
Albumin: 3.5 g/dL (ref 3.5–5.0)
Alkaline Phosphatase: 66 U/L (ref 38–126)
Anion gap: 8 (ref 5–15)
BUN: 19 mg/dL (ref 8–23)
CO2: 25 mmol/L (ref 22–32)
Calcium: 9.3 mg/dL (ref 8.9–10.3)
Chloride: 107 mmol/L (ref 98–111)
Creatinine, Ser: 0.81 mg/dL (ref 0.44–1.00)
GFR, Estimated: >60 mL/min (ref >60)
Glucose, Bld: 155 mg/dL — ABNORMAL HIGH (ref 70–99)
Potassium: 3.8 mmol/L (ref 3.5–5.1)
Sodium: 140 mmol/L (ref 135–145)
Total Bilirubin: 0.5 mg/dL (ref 0.3–1.2)
Total Protein: 7.2 g/dL (ref 6.5–8.1)

## 2022-12-31 LAB — TROPONIN I (HIGH SENSITIVITY): Troponin I (High Sensitivity): 6 ng/L (ref <18)

## 2022-12-31 LAB — PROCALCITONIN: Procalcitonin: <0.1 ng/mL

## 2022-12-31 LAB — BRAIN NATRIURETIC PEPTIDE: B Natriuretic Peptide: 36.2 pg/mL (ref 0.0–100.0)

## 2022-12-31 MED ORDER — ENOXAPARIN SODIUM 40 MG/0.4ML IJ SOSY
40.0000 mg | PREFILLED_SYRINGE | INTRAMUSCULAR | Status: DC
  Administered 2022-12-31 – 2023-01-04 (×5): 40 mg via SUBCUTANEOUS
  Filled 2022-12-31 (×5): qty 0.4

## 2022-12-31 MED ORDER — IPRATROPIUM-ALBUTEROL 20-100 MCG/ACT IN AERS
1.0000 | INHALATION_SPRAY | Freq: Four times a day (QID) | RESPIRATORY_TRACT | Status: DC
  Administered 2023-01-01 – 2023-01-05 (×15): 1 via RESPIRATORY_TRACT
  Filled 2022-12-31: qty 4

## 2022-12-31 MED ORDER — ACETAMINOPHEN 325 MG PO TABS
650.0000 mg | ORAL_TABLET | Freq: Four times a day (QID) | ORAL | Status: DC | PRN
  Administered 2023-01-01 – 2023-01-02 (×2): 650 mg via ORAL
  Filled 2022-12-31 (×2): qty 2

## 2022-12-31 MED ORDER — GUAIFENESIN 100 MG/5ML PO LIQD
200.0000 mg | ORAL | Status: DC | PRN
  Administered 2023-01-02 – 2023-01-04 (×4): 200 mg via ORAL
  Filled 2022-12-31 (×4): qty 10

## 2022-12-31 MED ORDER — LEVOTHYROXINE SODIUM 50 MCG PO TABS
75.0000 ug | ORAL_TABLET | Freq: Every day | ORAL | Status: DC
  Administered 2023-01-01 – 2023-01-05 (×5): 75 ug via ORAL
  Filled 2022-12-31 (×5): qty 2

## 2022-12-31 MED ORDER — IPRATROPIUM-ALBUTEROL 0.5-2.5 (3) MG/3ML IN SOLN
3.0000 mL | Freq: Four times a day (QID) | RESPIRATORY_TRACT | Status: DC
  Administered 2022-12-31: 3 mL via RESPIRATORY_TRACT
  Filled 2022-12-31 (×2): qty 3

## 2022-12-31 MED ORDER — ONDANSETRON HCL 4 MG/2ML IJ SOLN: 4.0000 mg | Freq: Three times a day (TID) | INTRAMUSCULAR | Status: DC | PRN

## 2022-12-31 MED ORDER — METHYLPREDNISOLONE SODIUM SUCC 40 MG IJ SOLR
40.0000 mg | Freq: Two times a day (BID) | INTRAMUSCULAR | Status: DC
  Administered 2022-12-31 – 2023-01-02 (×4): 40 mg via INTRAVENOUS
  Filled 2022-12-31 (×4): qty 1

## 2022-12-31 NOTE — ED Notes (Signed)
ivc/ COVID +.Marland KitchenMarland KitchenPatient was accepted to [facility] on 12/30/22 but will have to wait 5 days and re-send referral at that time.[name]

## 2022-12-31 NOTE — ED Notes (Signed)
IVC/Covid +/Medical Admit

## 2022-12-31 NOTE — ED Notes (Signed)
Upon obtaining vital signs, patient was found to have incomprehensible speech and patient was having increased breathing effort with any exertion. Dr. Erma Heritage notified and asked to assess patient since previous RN assessment showed patient had clear speech on most recent charted assessment.
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
## 2022-12-31 NOTE — ED Notes (Signed)
Pt given lunch tray at this time

## 2022-12-31 NOTE — ED Notes (Signed)
Hospital meal provided, pt tolerated w/o complaints.  Waste discarded appropriately.  

## 2022-12-31 NOTE — ED Notes (Signed)
Pt given milk and apple juice at this time.

## 2022-12-31 NOTE — ED Provider Notes (Signed)
7:28 AM Assumed care for off going team.   Blood pressure (!) 165/88, pulse 70, temperature 98.2 F (36.8 C), temperature source Oral, resp. rate 20, height 5' (1.524 m), weight 54.4 kg, SpO2 92 %.  See their HPI for full report but in brief pending re-eval of oxygen level.   Per nursing documenting " Upon obtaining vital signs, patient was found to have incomprehensible speech and patient was having increased breathing effort with any exertion. Dr. Erma Heritage notified and asked to assess patient since previous RN assessment showed patient had clear speech on most recent charted assessment. "   7:50 AM I reevaluated patient.  She is got some garbled speech noted but she is alert and oriented x 3 moving all extremities.  She is able to stand up and ambulate.  I reviewed the note from 4/30 where patient was seen by psychiatry and was noted to have garbled speech at this time.  I also did discuss with the provider Janee Morn who had seen her 2 nights ago and stated that she had had the garbled speech at that point therefore do not feel like this is a new finding.  Patient otherwise is answering all questions.  We can get a CT scan of her head just to ensure no evidence of intracranial hemorrhage but she would be out of the window for any stroke code given it seems like last known normal was a few days ago and was thought to more likely be related to psychiatric versus medication.  I reviewed the x-ray personally interpreted and patient has increased interstitial prominence MPRESSION: Increased interstitial prominence throughout both lungs, slightly greater on the left compared to the right. Findings are likely consistent with interstitial edema and also interstitial pneumonia.   CT head negative   Ambulatory sat patient desatted down to 88 to 89% with just a few steps.  I feel that patient will require admission for her new hypoxia secondary to COVID.  Will get a repeat blood work prior to  admission  Labs re-asssuring.       Concha Se, MD 12/31/22 [phone]

## 2022-12-31 NOTE — ED Notes (Signed)
Walked pt to the bathroom and back to her room. Her O2 was at 88-89.

## 2022-12-31 NOTE — ED Notes (Signed)
Attempts made by writer and NT to obtain temperature. Pt uncooperative with obtaining temperature.

## 2022-12-31 NOTE — ED Notes (Signed)
Dinner tray given at this time.  

## 2023-01-01 ENCOUNTER — Encounter: Payer: Self-pay | Admitting: Internal Medicine

## 2023-01-01 DIAGNOSIS — U071 COVID-19: Secondary | ICD-10-CM | POA: Diagnosis not present

## 2023-01-01 DIAGNOSIS — J069 Acute upper respiratory infection, unspecified: Secondary | ICD-10-CM | POA: Diagnosis not present

## 2023-01-01 LAB — BASIC METABOLIC PANEL
Anion gap: 7 (ref 5–15)
BUN: 24 mg/dL — ABNORMAL HIGH (ref 8–23)
CO2: 25 mmol/L (ref 22–32)
Calcium: 9.1 mg/dL (ref 8.9–10.3)
Chloride: 106 mmol/L (ref 98–111)
Creatinine, Ser: 0.67 mg/dL (ref 0.44–1.00)
GFR, Estimated: >60 mL/min (ref >60)
Glucose, Bld: 145 mg/dL — ABNORMAL HIGH (ref 70–99)
Potassium: 3.9 mmol/L (ref 3.5–5.1)
Sodium: 138 mmol/L (ref 135–145)

## 2023-01-01 LAB — CBC
HCT: 35.8 % — ABNORMAL LOW (ref 36.0–46.0)
Hemoglobin: 11.7 g/dL — ABNORMAL LOW (ref 12.0–15.0)
MCH: 29.3 pg (ref 26.0–34.0)
MCHC: 32.7 g/dL (ref 30.0–36.0)
MCV: 89.7 fL (ref 80.0–100.0)
Platelets: 354 10*3/uL (ref 150–400)
RBC: 3.99 MIL/uL (ref 3.87–5.11)
RDW: 15 % (ref 11.5–15.5)
WBC: 8.1 10*3/uL (ref 4.0–10.5)
nRBC: 0 % (ref 0.0–0.2)

## 2023-01-01 LAB — HIV ANTIBODY (ROUTINE TESTING W REFLEX): HIV Screen 4th Generation wRfx: NONREACTIVE

## 2023-01-01 NOTE — Hospital Course (Addendum)
Jamie Schultz is a 62 y.o. female with medical history significant of depression with anxiety, panic attack, ADHD, COPD, hypothyroidism, fibromyalgia and chronic pain, kidney stone, who presents to ED on 12/31/2022 from home with confusion, agitation, bizarre behavior.  04/30: brought to ED by law enforcement. Speaking incoherently to police, living in home that has needles and dog poop in it.  05/01: Psychiatrist is consulted in ED. Per psych NP Gillermo Murdoch, patient does meet criteria for geriatric-psychiatric inpatient admission, but pt is found to have positive COVID infection  05/02: oxygen desaturated to 89%. Admitted to hospitalist service given hypoxia. On evaluation by admitting hospitalist, she was confused and mildly agitated.  She was still orientated x 3, but cannot focus in answering questions. (+)UDS amphetamine, benzo, cannabinoid and TCA. PDMP reviewed - Rx for alprazolam, dextroamphetamine-amphetamine, Belsomra.  05/03: she is oriented but seems to have paranoia, pressured speech, disorganized/tangential thought process - describes people breaking into her house, leaving things that aren't hers, cops coming in to her house and not caring she was naked, she has a fixation on new dog she recently adopted, etc. Awaiting psychiatric placement but can't get this until 05/06 d/t COVID, becomes tearful when informed she needs to stay here and can't go home.  05/04: stable, still on IVC   Consultants:  Psychiatry   Procedures: none      ASSESSMENT & PLAN:   Principal Problem:   Acute respiratory disease due to COVID-19 virus Active Problems:   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HCC)   Hypothyroidism   ADD (attention deficit disorder)   Depression with anxiety   Bizarre behavior  Acute metabolic / toxic encephalopathy d/t COVID infection, possible psychoactive medication polypharmacy along with THC - acute encephalopathy/delirium resolved but remains w/ psychiatric delusional d/o  See  below re: psychiatric plan  Delirium and fall precautions   Acute respiratory disease due to COVID-19 virus:  Patient has oxygen desaturation to 89% on RA.   Chest showed interstitial prominence, indicating possible interstitial pneumonia.  BNP 36.2, less likely to have CHF.  Bronchodilators Solu-Medrol 40 mg twice daily --> po prednisone  As needed Robitussin Paxlovid to complete course    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HCC):  Wheezing, indicating COVID infection induced COPD exacerbation. Bronchodilators and steroids as above Incentive spirometry   Hypothyroidism:  TSH is elevated at 6.962 Increase Synthroid from 50 to 75 mcg daily Follow outpatient    ADD (attention deficit disorder), Depression with anxiety and Bizarre behavior: Consulted psychiatrist.  Patient meets criteria for psych inpatient treatment. As needed Xanax Seroquel Patient will be admitted to behavioral health unit 5 days after COVID dx          DVT prophylaxis: lovenox  Pertinent IV fluids/nutrition: no continuous IV fluids  Central lines / invasive devices: none  Code Status: FULL CODE  ACP documentation reviewed: 05/03 none on file in [facility]  Current Admission Status: inpatient   TOC needs / Dispo plan: will go to psychiatry unit when medically stable  Barriers to discharge / significant pending items: COVID, encephalopathy, anticipate medical readiness in couple days, will need to know protocol for COVID patients in psychiatric unit

## 2023-01-01 NOTE — Progress Notes (Addendum)
PROGRESS NOTE    Jamie Schultz   ZOX:[id number] DOB: [birth year]  DOA: 12/29/2022 Date of Service: 01/01/23 PCP: Oneita Hurt, No     Brief Narrative / Hospital Course:  Jamie Schultz is a 62 y.o. female with medical history significant of depression with anxiety, panic attack, ADHD, COPD, hypothyroidism, fibromyalgia and chronic pain, kidney stone, who presents to ED on 12/31/2022 from home with confusion, agitation, bizarre behavior.  04/30: brought to ED by law enforcement. Speaking incoherently to police, living in home that has needles and dog poop in it.  05/01: Psychiatrist is consulted in ED. Per psych NP Gillermo Murdoch, patient does meet criteria for geriatric-psychiatric inpatient admission, but pt is found to have positive COVID infection  05/02: oxygen desaturated to 89%. Admitted to hospitalist service given hypoxia. On evaluation by admitting hospitalist, she was confused and mildly agitated.  She was still orientated x 3, but cannot focus in answering questions. (+)UDS amphetamine, benzo, cannabinoid and TCA. PDMP reviewed - Rx for alprazolam, dextroamphetamine-amphetamine, Belsomra.  05/03: she is oriented but seems to have paranoia, pressured speech, disorganized/tangential thought process - describes people breaking into her house, leaving things that aren't hers, cops coming in to her house and not caring she was naked, she has a fixation on new dog she recently adopted, etc. Awaiting psychiatric placement but can't get this until 05/06 d/t COVID, becomes tearful when informed she needs to stay here and can't go home.   Consultants:  Psychiatry   Procedures: none      ASSESSMENT & PLAN:   Principal Problem:   Acute respiratory disease due to COVID-19 virus Active Problems:   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HCC)   Hypothyroidism   ADD (attention deficit disorder)   Depression with anxiety   Bizarre behavior  Acute metabolis / toxic encephalopathy d/t COVID infection,  possible psychoactive medication polypharmacy along with THC - acute encephalopathy/delirium resolved but remains w/ psychiatric delusional d/o  See below re: psychiatric plan  Delirium and fall precautions   Acute respiratory disease due to COVID-19 virus:  Patient has oxygen desaturation to 89% on RA.   Chest showed interstitial prominence, indicating possible interstitial pneumonia.  BNP 36.2, less likely to have CHF.  Bronchodilators Solu-Medrol 40 mg twice daily As needed Robitussin Paxlovid is ordered by EDP   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HCC):  Wheezing, indicating COVID infection induced COPD exacerbation. Bronchodilators and Solu-Medrol as above Incentive spirometry   Hypothyroidism:  TSH is elevated at 6.962 Increase Synthroid from 50 to 75 mcg daily Follow outpatient    ADD (attention deficit disorder), Depression with anxiety and Bizarre behavior: Consulted psychiatrist.  Patient meets criteria for psych inpatient treatment. As needed Xanax Seroquel Patient will be admitted to behavioral health unit  5 days following COVID dx          DVT prophylaxis: lovenox  Pertinent IV fluids/nutrition: no continuous IV fluids  Central lines / invasive devices: none  Code Status: FULL CODE  ACP documentation reviewed: 05/03 none on file in [facility]  Current Admission Status: inpatient   TOC needs / Dispo plan: will go to psychiatry unit when medically stable  Barriers to discharge / significant pending items: COVID, encephalopathy, anticipate medical readiness in couple days, will need to know protocol for COVID patients in psychiatric unit             Subjective / Brief ROS:  Denies CP/SOB.  Pain controlled.  Denies new weakness.  Tolerating diet.  Reports no concerns w/ urination/defecation.   Family  Communication: none at this time     Objective Findings:  Vitals:   01/01/23 0602 01/01/23 0850 01/01/23 1042 01/01/23 1258  BP: (!) 144/93 (!) 139/95  128/76   Pulse: 73 98  91  Resp: 20 18  18   Temp: 98.8 F (37.1 C) 98 F (36.7 C)  (!) 97.5 F (36.4 C)  TempSrc: Oral Oral  Oral  SpO2:  92% 94% 93%  Weight: 54.4 kg     Height: 5' (1.524 m)      No intake or output data in the 24 hours ending 01/01/23 1455 Filed Weights   12/29/22 1818 01/01/23 0602  Weight: 54.4 kg 54.4 kg    Examination:  Physical Exam Constitutional:      General: She is not in acute distress. Cardiovascular:     Rate and Rhythm: Normal rate and regular rhythm.     Heart sounds: Normal heart sounds.  Pulmonary:     Effort: Pulmonary effort is normal.     Breath sounds: Normal breath sounds.  Neurological:     General: No focal deficit present.     Mental Status: She is alert and oriented to person, place, and time.  Psychiatric:        Mood and Affect: Mood is anxious. Affect is labile.        Speech: Speech is rapid and pressured and tangential.        Behavior: Behavior is hyperactive. Behavior is not aggressive.        Thought Content: Thought content is paranoid and delusional.        Judgment: Judgment is inappropriate.          Scheduled Medications:   enoxaparin (LOVENOX) injection  40 mg Subcutaneous Q24H   Ipratropium-Albuterol  1 puff Inhalation Q6H WA   levothyroxine  75 mcg Oral Daily   methylPREDNISolone (SOLU-MEDROL) injection  40 mg Intravenous Q12H   nirmatrelvir/ritonavir  3 tablet Oral BID   QUEtiapine  400 mg Oral QHS    Continuous Infusions:   PRN Medications:  acetaminophen, ALPRAZolam, guaiFENesin, ondansetron (ZOFRAN) IV, temazepam  Antimicrobials from admission:  Anti-infectives (From admission, onward)    Start     Dose/Rate Route Frequency Ordered Stop   12/30/22 1000  nirmatrelvir/ritonavir (PAXLOVID) 3 tablet        3 tablet Oral 2 times daily 12/30/22 0909 01/04/23 0959           Data Reviewed:  I have personally reviewed the following...  CBC: Recent Labs  Lab 12/29/22 1822 12/31/22 1006  01/01/23 0500  WBC 8.3 8.3 8.1  NEUTROABS  --  5.6  --   HGB 12.2 12.2 11.7*  HCT 37.8 38.4 35.8*  MCV 93.6 93.7 89.7  PLT 364 322 354   Basic Metabolic Panel: Recent Labs  Lab 12/29/22 1822 12/31/22 1006 01/01/23 0500  NA 142 140 138  K 4.0 3.8 3.9  CL 109 107 106  CO2 24 25 25   GLUCOSE 112* 155* 145*  BUN 25* 19 24*  CREATININE 0.86 0.81 0.67  CALCIUM 9.7 9.3 9.1   GFR: Estimated Creatinine Clearance: 51.7 mL/min (by C-G formula based on SCr of 0.67 mg/dL). Liver Function Tests: Recent Labs  Lab 12/29/22 1822 12/31/22 1006  AST 35 27  ALT 23 19  ALKPHOS 81 66  BILITOT 0.7 0.5  PROT 8.0 7.2  ALBUMIN 4.0 3.5   No results for input(s): "LIPASE", "AMYLASE" in the last 168 hours. No results for input(s): "AMMONIA" in the  last 168 hours. Coagulation Profile: No results for input(s): "INR", "PROTIME" in the last 168 hours. Cardiac Enzymes: No results for input(s): "CKTOTAL", "CKMB", "CKMBINDEX", "TROPONINI" in the last 168 hours. BNP (last 3 results) No results for input(s): "PROBNP" in the last 8760 hours. HbA1C: No results for input(s): "HGBA1C" in the last 72 hours. CBG: No results for input(s): "GLUCAP" in the last 168 hours. Lipid Profile: No results for input(s): "CHOL", "HDL", "LDLCALC", "TRIG", "CHOLHDL", "LDLDIRECT" in the last 72 hours. Thyroid Function Tests: Recent Labs    12/29/22 1822  TSH 6.962*   Anemia Panel: No results for input(s): "VITAMINB12", "FOLATE", "FERRITIN", "TIBC", "IRON", "RETICCTPCT" in the last 72 hours. Most Recent Urinalysis On File:     Component Value Date/Time   COLORURINE YELLOW (A) 09/05/2021 1324   APPEARANCEUR CLEAR (A) 09/05/2021 1324   APPEARANCEUR Clear 02/14/2014 2007   LABSPEC 1.026 09/05/2021 1324   LABSPEC 1.021 02/14/2014 2007   PHURINE 5.0 09/05/2021 1324   GLUCOSEU NEGATIVE 09/05/2021 1324   GLUCOSEU Negative 02/14/2014 2007   HGBUR NEGATIVE 09/05/2021 1324   BILIRUBINUR NEGATIVE 09/05/2021 1324    BILIRUBINUR NEGATIVE 06/14/2015 1419   BILIRUBINUR Negative 02/14/2014 2007   KETONESUR NEGATIVE 09/05/2021 1324   PROTEINUR NEGATIVE 09/05/2021 1324   UROBILINOGEN 0.2 06/14/2015 1419   UROBILINOGEN 0.2 03/10/2012 1900   NITRITE NEGATIVE 09/05/2021 1324   LEUKOCYTESUR NEGATIVE 09/05/2021 1324   LEUKOCYTESUR Negative 02/14/2014 2007   Sepsis Labs: @LABRCNTIP (procalcitonin:4,lacticidven:4) Microbiology: Recent Results (from the past 240 hour(s))  SARS Coronavirus 2 by RT PCR (hospital order, performed in [facility] hospital lab) *cepheid single result test* Anterior Nasal Swab     Status: Abnormal   Collection Time: 12/30/22  7:48 AM   Specimen: Anterior Nasal Swab  Result Value Ref Range Status   SARS Coronavirus 2 by RT PCR POSITIVE (A) NEGATIVE Final    Comment: (NOTE) SARS-CoV-2 target nucleic acids are DETECTED  SARS-CoV-2 RNA is generally detectable in upper respiratory specimens  during the acute phase of infection.  Positive results are indicative  of the presence of the identified virus, but do not rule out bacterial infection or co-infection with other pathogens not detected by the test.  Clinical correlation with patient history and  other diagnostic information is necessary to determine patient infection status.  The expected result is negative.  Fact Sheet for Patients:   RoadLapTop.co.za   Fact Sheet for Healthcare Providers:   http://kim-miller.com/    This test is not yet approved or cleared by the [place] FDA and  has been authorized for detection and/or diagnosis of SARS-CoV-2 by FDA under an Emergency Use Authorization (EUA).  This EUA will remain in effect (meaning this test can be used) for the duration of  the COVID-19 declaration under Section 564(b)(1)  of the Act, 21 U.S.C. section 360-bbb-3(b)(1), unless the authorization is terminated or revoked sooner.   Performed at [facility], [address] [place], [place] [address]       Radiology Studies last 3 days: CT HEAD WO CONTRAST ( )  Result Date: 12/31/2022 CLINICAL DATA:  Headache, neuro deficit. EXAM: CT HEAD WITHOUT CONTRAST TECHNIQUE: Contiguous axial images were obtained from the base of the skull through the vertex without intravenous contrast. RADIATION DOSE REDUCTION: This exam was performed according to the departmental dose-optimization program which includes automated exposure control, adjustment of the mA and/or kV according to patient size and/or use of iterative reconstruction technique. COMPARISON:  Head CT 03/16/2022. FINDINGS: Brain: No acute  intracranial hemorrhage. Gray-white differentiation is preserved. No hydrocephalus or extra-axial collection. No mass effect or midline shift. Vascular: No hyperdense vessel or unexpected calcification. Skull: No calvarial fracture or suspicious bone lesion. Skull base is unremarkable. Sinuses/Orbits: Moderate pansinus disease. Orbits are unremarkable. Mastoids are well aerated. Other: None. IMPRESSION: 1. No acute intracranial abnormality. 2. Moderate pansinus disease. Electronically Signed   By: Orvan Falconer M.D.   On: 12/31/2022 08:43   DG Chest Portable 1 View  Result Date: 12/31/2022 CLINICAL DATA:  Shortness of breath and COVID infection. EXAM: PORTABLE CHEST 1 VIEW COMPARISON:  12/29/2022 FINDINGS: The heart size and mediastinal contours are within normal limits. Increased interstitial prominence throughout both lungs, slightly greater on the left compared to the right. Findings are likely consistent with some degree of interstitial edema and also interstitial pneumonia. No focal airspace consolidation, pleural fluid or pneumothorax identified. The visualized skeletal structures are unremarkable. IMPRESSION: Increased interstitial prominence throughout both lungs, slightly greater on the left compared to the right. Findings are likely consistent with interstitial edema  and also interstitial pneumonia. Electronically Signed   By: Irish Lack M.D.   On: 12/31/2022 07:55   DG Chest Port 1 View  Result Date: 12/29/2022 CLINICAL DATA:  10028 Wheezing 16109 EXAM: PORTABLE CHEST - 1 VIEW COMPARISON:  11/13/2019 FINDINGS: Cardiac silhouette is unremarkable. No pneumothorax or pleural effusion. The lungs are clear. Aorta is calcified. The visualized skeletal structures are unremarkable. IMPRESSION: No acute cardiopulmonary process. Electronically Signed   By: Layla Maw M.D.   On: 12/29/2022 20:17             LOS: 1 day      Sunnie Nielsen, DO [facility] 01/01/2023, 2:55 PM    Dictation software may have been used to generate the above note. Typos may occur and escape review in typed/dictated notes. Please contact Dr Lyn Hollingshead directly for clarity if needed.  Staff may message me via secure chat in Epic  but this may not receive an immediate response,  please page me for urgent matters!  If 7PM-7AM, please contact night coverage [email or web address]

## 2023-01-02 DIAGNOSIS — J069 Acute upper respiratory infection, unspecified: Secondary | ICD-10-CM | POA: Diagnosis not present

## 2023-01-02 DIAGNOSIS — U071 COVID-19: Secondary | ICD-10-CM | POA: Diagnosis not present

## 2023-01-02 MED ORDER — PREDNISONE 20 MG PO TABS
40.0000 mg | ORAL_TABLET | Freq: Every day | ORAL | Status: AC
  Administered 2023-01-02 – 2023-01-04 (×3): 40 mg via ORAL
  Filled 2023-01-02 (×3): qty 2

## 2023-01-02 MED ORDER — OXYCODONE HCL 5 MG PO TABS
5.0000 mg | ORAL_TABLET | Freq: Two times a day (BID) | ORAL | Status: DC | PRN
  Administered 2023-01-02 – 2023-01-05 (×5): 5 mg via ORAL
  Filled 2023-01-02 (×5): qty 1

## 2023-01-02 MED ORDER — ACETAMINOPHEN 325 MG PO TABS
650.0000 mg | ORAL_TABLET | Freq: Four times a day (QID) | ORAL | Status: DC | PRN
  Administered 2023-01-03 – 2023-01-05 (×4): 650 mg via ORAL
  Filled 2023-01-02 (×4): qty 2

## 2023-01-02 NOTE — Progress Notes (Signed)
PROGRESS NOTE    Jamie Schultz   ZOX:[id number] DOB: 19-Dec-1960  DOA: 12/29/2022 Date of Service: 01/02/23 PCP: Oneita Hurt, No     Brief Narrative / Hospital Course:  Jamie Schultz is a 62 y.o. female with medical history significant of depression with anxiety, panic attack, ADHD, COPD, hypothyroidism, fibromyalgia and chronic pain, kidney stone, who presents to ED on 12/31/2022 from home with confusion, agitation, bizarre behavior.  04/30: brought to ED by law enforcement. Speaking incoherently to police, living in home that has needles and dog poop in it.  05/01: Psychiatrist is consulted in ED. Per psych NP Gillermo Murdoch, patient does meet criteria for geriatric-psychiatric inpatient admission, but pt is found to have positive COVID infection  05/02: oxygen desaturated to 89%. Admitted to hospitalist service given hypoxia. On evaluation by admitting hospitalist, she was confused and mildly agitated.  She was still orientated x 3, but cannot focus in answering questions. (+)UDS amphetamine, benzo, cannabinoid and TCA. PDMP reviewed - Rx for alprazolam, dextroamphetamine-amphetamine, Belsomra.  05/03: she is oriented but seems to have paranoia, pressured speech, disorganized/tangential thought process - describes people breaking into her house, leaving things that aren't hers, cops coming in to her house and not caring she was naked, she has a fixation on new dog she recently adopted, etc. Awaiting psychiatric placement but can't get this until 05/06 d/t COVID, becomes tearful when informed she needs to stay here and can't go home.  05/04: stable, still on IVC   Consultants:  Psychiatry   Procedures: none      ASSESSMENT & PLAN:   Principal Problem:   Acute respiratory disease due to COVID-19 virus Active Problems:   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HCC)   Hypothyroidism   ADD (attention deficit disorder)   Depression with anxiety   Bizarre behavior  Acute metabolic / toxic  encephalopathy d/t COVID infection, possible psychoactive medication polypharmacy along with THC - acute encephalopathy/delirium resolved but remains w/ psychiatric delusional d/o  See below re: psychiatric plan  Delirium and fall precautions   Acute respiratory disease due to COVID-19 virus:  Patient has oxygen desaturation to 89% on RA.   Chest showed interstitial prominence, indicating possible interstitial pneumonia.  BNP 36.2, less likely to have CHF.  Bronchodilators Solu-Medrol 40 mg twice daily --> po prednisone  As needed Robitussin Paxlovid to complete course    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HCC):  Wheezing, indicating COVID infection induced COPD exacerbation. Bronchodilators and steroids as above Incentive spirometry   Hypothyroidism:  TSH is elevated at 6.962 Increase Synthroid from 50 to 75 mcg daily Follow outpatient    ADD (attention deficit disorder), Depression with anxiety and Bizarre behavior: Consulted psychiatrist.  Patient meets criteria for psych inpatient treatment. As needed Xanax Seroquel Patient will be admitted to behavioral health unit 5 days after COVID dx          DVT prophylaxis: lovenox  Pertinent IV fluids/nutrition: no continuous IV fluids  Central lines / invasive devices: none  Code Status: FULL CODE  ACP documentation reviewed: 05/03 none on file in [facility]  Current Admission Status: inpatient   TOC needs / Dispo plan: will go to psychiatry unit when medically stable  Barriers to discharge / significant pending items: COVID, encephalopathy, anticipate medical readiness in couple days, will need to know protocol for COVID patients in psychiatric unit             Subjective / Brief ROS:  Denies CP/SOB.  Pain controlled.  Tolerating diet.  Upset she can't  go home.   Family Communication: none at this time     Objective Findings:  Vitals:   01/01/23 1258 01/01/23 2057 01/02/23 0414 01/02/23 1202  BP: 128/76 (!) 164/90 (!)  163/89 129/86  Pulse: 91 86 81 (!) 105  Resp: 18 18 17 18   Temp: (!) 97.5 F (36.4 C) 97.7 F (36.5 C) 97.6 F (36.4 C) 98 F (36.7 C)  TempSrc: Oral Oral Oral Oral  SpO2: 93% 94% 93% 91%  Weight:      Height:        Intake/Output Summary (Last 24 hours) at 01/02/2023 1222 Last data filed at 01/01/2023 2134 Gross per 24 hour  Intake 600 ml  Output --  Net 600 ml   Filed Weights   12/29/22 1818 01/01/23 0602  Weight: 54.4 kg 54.4 kg    Examination:  Physical Exam Constitutional:      General: She is not in acute distress. Cardiovascular:     Rate and Rhythm: Normal rate and regular rhythm.     Heart sounds: Normal heart sounds.  Pulmonary:     Effort: Pulmonary effort is normal.     Breath sounds: Normal breath sounds.  Neurological:     General: No focal deficit present.     Mental Status: She is alert and oriented to person, place, and time.  Psychiatric:        Mood and Affect: Mood is anxious. Affect is labile.        Speech: Speech is rapid and pressured and tangential.        Behavior: Behavior is hyperactive. Behavior is not aggressive.        Thought Content: Thought content is paranoid and delusional.        Judgment: Judgment is inappropriate.          Scheduled Medications:   enoxaparin (LOVENOX) injection  40 mg Subcutaneous Q24H   Ipratropium-Albuterol  1 puff Inhalation Q6H WA   levothyroxine  75 mcg Oral Daily   nirmatrelvir/ritonavir  3 tablet Oral BID   predniSONE  40 mg Oral Q breakfast   QUEtiapine  400 mg Oral QHS    Continuous Infusions:   PRN Medications:  acetaminophen, ALPRAZolam, guaiFENesin, ondansetron (ZOFRAN) IV, oxyCODONE, temazepam  Antimicrobials from admission:  Anti-infectives (From admission, onward)    Start     Dose/Rate Route Frequency Ordered Stop   12/30/22 1000  nirmatrelvir/ritonavir (PAXLOVID) 3 tablet        3 tablet Oral 2 times daily 12/30/22 0909 01/04/23 0959           Data Reviewed:  I have  personally reviewed the following...  CBC: Recent Labs  Lab 12/29/22 1822 12/31/22 1006 01/01/23 0500  WBC 8.3 8.3 8.1  NEUTROABS  --  5.6  --   HGB 12.2 12.2 11.7*  HCT 37.8 38.4 35.8*  MCV 93.6 93.7 89.7  PLT 364 322 354   Basic Metabolic Panel: Recent Labs  Lab 12/29/22 1822 12/31/22 1006 01/01/23 0500  NA 142 140 138  K 4.0 3.8 3.9  CL 109 107 106  CO2 24 25 25   GLUCOSE 112* 155* 145*  BUN 25* 19 24*  CREATININE 0.86 0.81 0.67  CALCIUM 9.7 9.3 9.1   GFR: Estimated Creatinine Clearance: 51.7 mL/min (by C-G formula based on SCr of 0.67 mg/dL). Liver Function Tests: Recent Labs  Lab 12/29/22 1822 12/31/22 1006  AST 35 27  ALT 23 19  ALKPHOS 81 66  BILITOT 0.7 0.5  PROT  8.0 7.2  ALBUMIN 4.0 3.5   No results for input(s): "LIPASE", "AMYLASE" in the last 168 hours. No results for input(s): "AMMONIA" in the last 168 hours. Coagulation Profile: No results for input(s): "INR", "PROTIME" in the last 168 hours. Cardiac Enzymes: No results for input(s): "CKTOTAL", "CKMB", "CKMBINDEX", "TROPONINI" in the last 168 hours. BNP (last 3 results) No results for input(s): "PROBNP" in the last 8760 hours. HbA1C: No results for input(s): "HGBA1C" in the last 72 hours. CBG: No results for input(s): "GLUCAP" in the last 168 hours. Lipid Profile: No results for input(s): "CHOL", "HDL", "LDLCALC", "TRIG", "CHOLHDL", "LDLDIRECT" in the last 72 hours. Thyroid Function Tests: No results for input(s): "TSH", "T4TOTAL", "FREET4", "T3FREE", "THYROIDAB" in the last 72 hours.  Anemia Panel: No results for input(s): "VITAMINB12", "FOLATE", "FERRITIN", "TIBC", "IRON", "RETICCTPCT" in the last 72 hours. Most Recent Urinalysis On File:     Component Value Date/Time   COLORURINE YELLOW (A) 09/05/2021 1324   APPEARANCEUR CLEAR (A) 09/05/2021 1324   APPEARANCEUR Clear 02/14/2014 2007   LABSPEC 1.026 09/05/2021 1324   LABSPEC 1.021 02/14/2014 2007   PHURINE 5.0 09/05/2021 1324    GLUCOSEU NEGATIVE 09/05/2021 1324   GLUCOSEU Negative 02/14/2014 2007   HGBUR NEGATIVE 09/05/2021 1324   BILIRUBINUR NEGATIVE 09/05/2021 1324   BILIRUBINUR NEGATIVE 06/14/2015 1419   BILIRUBINUR Negative 02/14/2014 2007   KETONESUR NEGATIVE 09/05/2021 1324   PROTEINUR NEGATIVE 09/05/2021 1324   UROBILINOGEN 0.2 06/14/2015 1419   UROBILINOGEN 0.2 03/10/2012 1900   NITRITE NEGATIVE 09/05/2021 1324   LEUKOCYTESUR NEGATIVE 09/05/2021 1324   LEUKOCYTESUR Negative 02/14/2014 2007   Sepsis Labs: @LABRCNTIP (procalcitonin:4,lacticidven:4) Microbiology: Recent Results (from the past 240 hour(s))  SARS Coronavirus 2 by RT PCR (hospital order, performed in [facility] lab) *cepheid single result test* Anterior Nasal Swab     Status: Abnormal   Collection Time: 12/30/22  7:48 AM   Specimen: Anterior Nasal Swab  Result Value Ref Range Status   SARS Coronavirus 2 by RT PCR POSITIVE (A) NEGATIVE Final    Comment: (NOTE) SARS-CoV-2 target nucleic acids are DETECTED  SARS-CoV-2 RNA is generally detectable in upper respiratory specimens  during the acute phase of infection.  Positive results are indicative  of the presence of the identified virus, but do not rule out bacterial infection or co-infection with other pathogens not detected by the test.  Clinical correlation with patient history and  other diagnostic information is necessary to determine patient infection status.  The expected result is negative.  Fact Sheet for Patients:   RoadLapTop.co.za   Fact Sheet for Healthcare Providers:   http://kim-miller.com/    This test is not yet approved or cleared by the [place] FDA and  has been authorized for detection and/or diagnosis of SARS-CoV-2 by FDA under an Emergency Use Authorization (EUA).  This EUA will remain in effect (meaning this test can be used) for the duration of  the COVID-19 declaration under Section 564(b)(1)  of  the Act, 21 U.S.C. section 360-bbb-3(b)(1), unless the authorization is terminated or revoked sooner.   Performed at [facility], [address] [place], [place] [address]       Radiology Studies last 3 days: CT HEAD WO CONTRAST ( )  Result Date: 12/31/2022 CLINICAL DATA:  Headache, neuro deficit. EXAM: CT HEAD WITHOUT CONTRAST TECHNIQUE: Contiguous axial images were obtained from the base of the skull through the vertex without intravenous contrast. RADIATION DOSE REDUCTION: This exam was performed according to the departmental dose-optimization program which  includes automated exposure control, adjustment of the mA and/or kV according to patient size and/or use of iterative reconstruction technique. COMPARISON:  Head CT 03/16/2022. FINDINGS: Brain: No acute intracranial hemorrhage. Gray-white differentiation is preserved. No hydrocephalus or extra-axial collection. No mass effect or midline shift. Vascular: No hyperdense vessel or unexpected calcification. Skull: No calvarial fracture or suspicious bone lesion. Skull base is unremarkable. Sinuses/Orbits: Moderate pansinus disease. Orbits are unremarkable. Mastoids are well aerated. Other: None. IMPRESSION: 1. No acute intracranial abnormality. 2. Moderate pansinus disease. Electronically Signed   By: Orvan Falconer M.D.   On: 12/31/2022 08:43   DG Chest Portable 1 View  Result Date: 12/31/2022 CLINICAL DATA:  Shortness of breath and COVID infection. EXAM: PORTABLE CHEST 1 VIEW COMPARISON:  12/29/2022 FINDINGS: The heart size and mediastinal contours are within normal limits. Increased interstitial prominence throughout both lungs, slightly greater on the left compared to the right. Findings are likely consistent with some degree of interstitial edema and also interstitial pneumonia. No focal airspace consolidation, pleural fluid or pneumothorax identified. The visualized skeletal structures are unremarkable. IMPRESSION: Increased  interstitial prominence throughout both lungs, slightly greater on the left compared to the right. Findings are likely consistent with interstitial edema and also interstitial pneumonia. Electronically Signed   By: Irish Lack M.D.   On: 12/31/2022 07:55   DG Chest Port 1 View  Result Date: 12/29/2022 CLINICAL DATA:  10028 Wheezing [id number] EXAM: PORTABLE CHEST - 1 VIEW COMPARISON:  11/13/2019 FINDINGS: Cardiac silhouette is unremarkable. No pneumothorax or pleural effusion. The lungs are clear. Aorta is calcified. The visualized skeletal structures are unremarkable. IMPRESSION: No acute cardiopulmonary process. Electronically Signed   By: Layla Maw M.D.   On: 12/29/2022 20:17             LOS: 2 days      Sunnie Nielsen, DO [facility] 01/02/2023, 12:22 PM    Dictation software may have been used to generate the above note. Typos may occur and escape review in typed/dictated notes. Please contact Dr Lyn Hollingshead directly for clarity if needed.  Staff may message me via secure chat in Epic  but this may not receive an immediate response,  please page me for urgent matters!  If 7PM-7AM, please contact night coverage [email or web address]

## 2023-01-03 DIAGNOSIS — U071 COVID-19: Secondary | ICD-10-CM | POA: Diagnosis not present

## 2023-01-03 DIAGNOSIS — J069 Acute upper respiratory infection, unspecified: Secondary | ICD-10-CM | POA: Diagnosis not present

## 2023-01-03 NOTE — Progress Notes (Signed)
PROGRESS NOTE    Jamie Schultz   ZOX:[id number] DOB: Oct 31, 1960  DOA: 12/29/2022 Date of Service: 01/03/23 PCP: Oneita Hurt, No     Brief Narrative / Hospital Course:  Jamie Schultz is a 62 y.o. female with medical history significant of depression with anxiety, panic attack, ADHD, COPD, hypothyroidism, fibromyalgia and chronic pain, kidney stone, who presents to ED on 12/31/2022 from home with confusion, agitation, bizarre behavior.  04/30: brought to ED by law enforcement. Speaking incoherently to police, living in home that has needles and dog poop in it.  05/01: Psychiatrist is consulted in ED. Per psych NP Gillermo Murdoch, patient does meet criteria for geriatric-psychiatric inpatient admission, but pt is found to have positive COVID infection  05/02: oxygen desaturated to 89%. Admitted to hospitalist service given hypoxia. On evaluation by admitting hospitalist, she was confused and mildly agitated.  She was still orientated x 3, but cannot focus in answering questions. (+)UDS amphetamine, benzo, cannabinoid and TCA. PDMP reviewed - Rx for alprazolam, dextroamphetamine-amphetamine, Belsomra.  05/03: she is oriented but seems to have paranoia, pressured speech, disorganized/tangential thought process - describes people breaking into her house, leaving things that aren't hers, cops coming in to her house and not caring she was naked, she has a fixation on new dog she recently adopted, etc. Awaiting psychiatric placement but can't get this until 05/06 d/t COVID, becomes tearful when informed she needs to stay here and can't go home.  05/04-05/05 (weekend): stable, still on IVC await psychiatry placement    Consultants:  Psychiatry   Procedures: none      ASSESSMENT & PLAN:   Principal Problem:   Acute respiratory disease due to COVID-19 virus Active Problems:   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HCC)   Hypothyroidism   ADD (attention deficit disorder)   Depression with anxiety   Bizarre  behavior  Acute metabolic / toxic encephalopathy d/t COVID infection, possible psychoactive medication polypharmacy along with THC - acute encephalopathy/delirium resolved but remains w/ psychiatric delusional d/o  See below re: psychiatric plan  Delirium and fall precautions   Acute respiratory disease due to COVID-19 virus:  Patient has oxygen desaturation to 89% on RA.   Chest showed interstitial prominence, indicating possible interstitial pneumonia.  BNP 36.2, less likely to have CHF.  Bronchodilators Solu-Medrol 40 mg twice daily --> po prednisone  As needed Robitussin Paxlovid to complete course    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HCC):  Wheezing, indicating COVID infection induced COPD exacerbation. Bronchodilators and steroids as above Incentive spirometry   Hypothyroidism:  TSH is elevated at 6.962 Increase Synthroid from 50 to 75 mcg daily Follow outpatient    ADD (attention deficit disorder), Depression with anxiety and Bizarre behavior: Consulted psychiatrist.  Patient meets criteria for psych inpatient treatment. As needed Xanax Seroquel Patient will be admitted to behavioral health unit 5 days after COVID dx          DVT prophylaxis: lovenox  Pertinent IV fluids/nutrition: no continuous IV fluids  Central lines / invasive devices: none  Code Status: FULL CODE  ACP documentation reviewed: 05/03 none on file in [facility]  Current Admission Status: inpatient   TOC needs / Dispo plan: will go to psychiatry unit when medically stable  Barriers to discharge / significant pending items: COVID, encephalopathy, she is medially stable, will need to know protocol for COVID patients in psychiatric unit             Subjective / Brief ROS:  Resting, no concerns from pt or RN  Family  Communication: none at this time     Objective Findings:  Vitals:   01/02/23 1554 01/02/23 1912 01/03/23 0316 01/03/23 0738  BP: (!) 150/88 (!) 169/98 (!) 144/81 (!) 142/89  Pulse: 94  95 87 95  Resp: 18 18 16 19   Temp: 98.2 F (36.8 C) 98.1 F (36.7 C) 97.8 F (36.6 C) 97.9 F (36.6 C)  TempSrc: Oral Oral Oral Oral  SpO2: 94% 94% 95% 94%  Weight:      Height:        Intake/Output Summary (Last 24 hours) at 01/03/2023 1301 Last data filed at 01/03/2023 0553 Gross per 24 hour  Intake 960 ml  Output 0 ml  Net 960 ml   Filed Weights   12/29/22 1818 01/01/23 0602  Weight: 54.4 kg 54.4 kg    Examination:  Physical Exam Constitutional:      General: She is not in acute distress. Cardiovascular:     Rate and Rhythm: Normal rate and regular rhythm.     Heart sounds: Normal heart sounds.  Pulmonary:     Effort: Pulmonary effort is normal.     Breath sounds: Normal breath sounds.          Scheduled Medications:   enoxaparin (LOVENOX) injection  40 mg Subcutaneous Q24H   Ipratropium-Albuterol  1 puff Inhalation Q6H WA   levothyroxine  75 mcg Oral Daily   nirmatrelvir/ritonavir  3 tablet Oral BID   predniSONE  40 mg Oral Q breakfast   QUEtiapine  400 mg Oral QHS    Continuous Infusions:   PRN Medications:  acetaminophen, ALPRAZolam, guaiFENesin, ondansetron (ZOFRAN) IV, oxyCODONE, temazepam  Antimicrobials from admission:  Anti-infectives (From admission, onward)    Start     Dose/Rate Route Frequency Ordered Stop   12/30/22 1000  nirmatrelvir/ritonavir (PAXLOVID) 3 tablet        3 tablet Oral 2 times daily 12/30/22 0909 01/04/23 0959           Data Reviewed:  I have personally reviewed the following...  CBC: Recent Labs  Lab 12/29/22 1822 12/31/22 1006 01/01/23 0500  WBC 8.3 8.3 8.1  NEUTROABS  --  5.6  --   HGB 12.2 12.2 11.7*  HCT 37.8 38.4 35.8*  MCV 93.6 93.7 89.7  PLT 364 322 354   Basic Metabolic Panel: Recent Labs  Lab 12/29/22 1822 12/31/22 1006 01/01/23 0500  NA 142 140 138  K 4.0 3.8 3.9  CL 109 107 106  CO2 24 25 25   GLUCOSE 112* 155* 145*  BUN 25* 19 24*  CREATININE 0.86 0.81 0.67  CALCIUM 9.7 9.3 9.1    GFR: Estimated Creatinine Clearance: 51.7 mL/min (by C-G formula based on SCr of 0.67 mg/dL). Liver Function Tests: Recent Labs  Lab 12/29/22 1822 12/31/22 1006  AST 35 27  ALT 23 19  ALKPHOS 81 66  BILITOT 0.7 0.5  PROT 8.0 7.2  ALBUMIN 4.0 3.5   No results for input(s): "LIPASE", "AMYLASE" in the last 168 hours. No results for input(s): "AMMONIA" in the last 168 hours. Coagulation Profile: No results for input(s): "INR", "PROTIME" in the last 168 hours. Cardiac Enzymes: No results for input(s): "CKTOTAL", "CKMB", "CKMBINDEX", "TROPONINI" in the last 168 hours. BNP (last 3 results) No results for input(s): "PROBNP" in the last 8760 hours. HbA1C: No results for input(s): "HGBA1C" in the last 72 hours. CBG: No results for input(s): "GLUCAP" in the last 168 hours. Lipid Profile: No results for input(s): "CHOL", "HDL", "LDLCALC", "TRIG", "CHOLHDL", "LDLDIRECT"  in the last 72 hours. Thyroid Function Tests: No results for input(s): "TSH", "T4TOTAL", "FREET4", "T3FREE", "THYROIDAB" in the last 72 hours.  Anemia Panel: No results for input(s): "VITAMINB12", "FOLATE", "FERRITIN", "TIBC", "IRON", "RETICCTPCT" in the last 72 hours. Most Recent Urinalysis On File:     Component Value Date/Time   COLORURINE YELLOW (A) 09/05/2021 1324   APPEARANCEUR CLEAR (A) 09/05/2021 1324   APPEARANCEUR Clear 02/14/2014 2007   LABSPEC 1.026 09/05/2021 1324   LABSPEC 1.021 02/14/2014 2007   PHURINE 5.0 09/05/2021 1324   GLUCOSEU NEGATIVE 09/05/2021 1324   GLUCOSEU Negative 02/14/2014 2007   HGBUR NEGATIVE 09/05/2021 1324   BILIRUBINUR NEGATIVE 09/05/2021 1324   BILIRUBINUR NEGATIVE 06/14/2015 1419   BILIRUBINUR Negative 02/14/2014 2007   KETONESUR NEGATIVE 09/05/2021 1324   PROTEINUR NEGATIVE 09/05/2021 1324   UROBILINOGEN 0.2 06/14/2015 1419   UROBILINOGEN 0.2 03/10/2012 1900   NITRITE NEGATIVE 09/05/2021 1324   LEUKOCYTESUR NEGATIVE 09/05/2021 1324   LEUKOCYTESUR Negative 02/14/2014  2007   Sepsis Labs: @LABRCNTIP (procalcitonin:4,lacticidven:4) Microbiology: Recent Results (from the past 240 hour(s))  SARS Coronavirus 2 by RT PCR (hospital order, performed in [facility] hospital lab) *cepheid single result test* Anterior Nasal Swab     Status: Abnormal   Collection Time: 12/30/22  7:48 AM   Specimen: Anterior Nasal Swab  Result Value Ref Range Status   SARS Coronavirus 2 by RT PCR POSITIVE (A) NEGATIVE Final    Comment: (NOTE) SARS-CoV-2 target nucleic acids are DETECTED  SARS-CoV-2 RNA is generally detectable in upper respiratory specimens  during the acute phase of infection.  Positive results are indicative  of the presence of the identified virus, but do not rule out bacterial infection or co-infection with other pathogens not detected by the test.  Clinical correlation with patient history and  other diagnostic information is necessary to determine patient infection status.  The expected result is negative.  Fact Sheet for Patients:   RoadLapTop.co.za   Fact Sheet for Healthcare Providers:   http://kim-miller.com/    This test is not yet approved or cleared by the [place] FDA and  has been authorized for detection and/or diagnosis of SARS-CoV-2 by FDA under an Emergency Use Authorization (EUA).  This EUA will remain in effect (meaning this test can be used) for the duration of  the COVID-19 declaration under Section 564(b)(1)  of the Act, 21 U.S.C. section 360-bbb-3(b)(1), unless the authorization is terminated or revoked sooner.   Performed at [facility], [address], [place], [place] [address]       Radiology Studies last 3 days: CT HEAD WO CONTRAST ( )  Result Date: 12/31/2022 CLINICAL DATA:  Headache, neuro deficit. EXAM: CT HEAD WITHOUT CONTRAST TECHNIQUE: Contiguous axial images were obtained from the base of the skull through the vertex without intravenous contrast.  RADIATION DOSE REDUCTION: This exam was performed according to the departmental dose-optimization program which includes automated exposure control, adjustment of the mA and/or kV according to patient size and/or use of iterative reconstruction technique. COMPARISON:  Head CT 03/16/2022. FINDINGS: Brain: No acute intracranial hemorrhage. Gray-white differentiation is preserved. No hydrocephalus or extra-axial collection. No mass effect or midline shift. Vascular: No hyperdense vessel or unexpected calcification. Skull: No calvarial fracture or suspicious bone lesion. Skull base is unremarkable. Sinuses/Orbits: Moderate pansinus disease. Orbits are unremarkable. Mastoids are well aerated. Other: None. IMPRESSION: 1. No acute intracranial abnormality. 2. Moderate pansinus disease. Electronically Signed   By: Orvan Falconer M.D.   On: 12/31/2022 08:43   DG  Chest Portable 1 View  Result Date: 12/31/2022 CLINICAL DATA:  Shortness of breath and COVID infection. EXAM: PORTABLE CHEST 1 VIEW COMPARISON:  12/29/2022 FINDINGS: The heart size and mediastinal contours are within normal limits. Increased interstitial prominence throughout both lungs, slightly greater on the left compared to the right. Findings are likely consistent with some degree of interstitial edema and also interstitial pneumonia. No focal airspace consolidation, pleural fluid or pneumothorax identified. The visualized skeletal structures are unremarkable. IMPRESSION: Increased interstitial prominence throughout both lungs, slightly greater on the left compared to the right. Findings are likely consistent with interstitial edema and also interstitial pneumonia. Electronically Signed   By: Irish Lack M.D.   On: 12/31/2022 07:55             LOS: 3 days      Sunnie Nielsen, DO [facility] 01/03/2023, 1:01 PM    Dictation software may have been used to generate the above note. Typos may occur and escape review in  typed/dictated notes. Please contact Dr Lyn Hollingshead directly for clarity if needed.  Staff may message me via secure chat in Epic  but this may not receive an immediate response,  please page me for urgent matters!  If 7PM-7AM, please contact night coverage [email or web address]

## 2023-01-04 DIAGNOSIS — J069 Acute upper respiratory infection, unspecified: Secondary | ICD-10-CM | POA: Diagnosis not present

## 2023-01-04 DIAGNOSIS — U071 COVID-19: Secondary | ICD-10-CM | POA: Diagnosis not present

## 2023-01-04 NOTE — TOC Progression Note (Signed)
Transition of Care [facility]) - Progression Note    Patient Details  Name: Jamie Schultz MRN: [id number] Date of Birth: 1960-10-09  [facility]) CM/SW Contact  Chapman Fitch, RN Phone Number: 01/04/2023, 4:30 PM  Clinical Narrative:     Per [facility] geripsych patient would need to complete 10 day covid quarantine   Have not heard back from referral sent to [facility]        Expected Discharge Plan and Services                                               Social Determinants of Health (SDOH) Interventions SDOH Screenings   Food Insecurity: No Food Insecurity (01/01/2023)  Housing: Low Risk  (01/01/2023)  Transportation Needs: No Transportation Needs (01/01/2023)  Utilities: Not At Risk (01/01/2023)  Financial Resource Strain: Low Risk  (07/27/2017)  Physical Activity: Unknown (07/27/2017)  Social Connections: Unknown (07/27/2017)  Stress: Stress Concern Present (07/27/2017)  Tobacco Use: Medium Risk (01/01/2023)    Readmission Risk Interventions    01/02/2021   11:22 AM  Readmission Risk Prevention Plan  Transportation Screening Complete  PCP or Specialist Appt within 3-5 Days Complete  Social Work Consult for Recovery Care Planning/Counseling Complete  Palliative Care Screening Not Applicable  Medication Review Oceanographer) Complete

## 2023-01-04 NOTE — Plan of Care (Signed)
  Problem: Respiratory: Goal: Will maintain a patent airway Outcome: Progressing   Problem: Safety: Goal: Ability to remain free from injury will improve Outcome: Progressing   

## 2023-01-04 NOTE — TOC Initial Note (Signed)
Transition of Care [facility]) - Progression Note    Patient Details  Name: Jamie Schultz MRN: [id number] Date of Birth: [birth year]  Transition of Care [facility]) CM/SW Contact  Chapman Fitch, RN Phone Number: 01/04/2023, 9:41 AM  Clinical Narrative:     Sherron Monday with Thayer Ohm at [facility] [phone].  Refaxed referral to [phone].  She states if patient is accepted she will call me back        Expected Discharge Plan and Services                                               Social Determinants of Health (SDOH) Interventions SDOH Screenings   Food Insecurity: No Food Insecurity (01/01/2023)  Housing: Low Risk  (01/01/2023)  Transportation Needs: No Transportation Needs (01/01/2023)  Utilities: Not At Risk (01/01/2023)  Financial Resource Strain: Low Risk  (07/27/2017)  Physical Activity: Unknown (07/27/2017)  Social Connections: Unknown (07/27/2017)  Stress: Stress Concern Present (07/27/2017)  Tobacco Use: Medium Risk (01/01/2023)    Readmission Risk Interventions    01/02/2021   11:22 AM  Readmission Risk Prevention Plan  Transportation Screening Complete  PCP or Specialist Appt within 3-5 Days Complete  Social Work Consult for Recovery Care Planning/Counseling Complete  Palliative Care Screening Not Applicable  Medication Review Oceanographer) Complete

## 2023-01-04 NOTE — Progress Notes (Signed)
PROGRESS NOTE    Jamie Schultz   ZOX:[id number] DOB: 22-Oct-1960  DOA: 12/29/2022 Date of Service: 01/04/23 PCP: Oneita Hurt, No     Brief Narrative / Hospital Course:  Jamie Schultz is a 62 y.o. female with medical history significant of depression with anxiety, panic attack, ADHD, COPD, hypothyroidism, fibromyalgia and chronic pain, kidney stone, who presents to ED on 12/31/2022 from home with confusion, agitation, bizarre behavior.  04/30: brought to ED by law enforcement. Speaking incoherently to police, living in home that has needles and dog poop in it.  05/01: Psychiatrist is consulted in ED. Per psych NP Gillermo Murdoch, patient does meet criteria for geriatric-psychiatric inpatient admission, but pt is found to have positive COVID infection  05/02: oxygen desaturated to 89%. Admitted to hospitalist service given hypoxia. On evaluation by admitting hospitalist, she was confused and mildly agitated.  She was still orientated x 3, but cannot focus in answering questions. (+)UDS amphetamine, benzo, cannabinoid and TCA. PDMP reviewed - Rx for alprazolam, dextroamphetamine-amphetamine, Belsomra.  05/03: she is oriented but seems to have paranoia, pressured speech, disorganized/tangential thought process - describes people breaking into her house, leaving things that aren't hers, cops coming in to her house and not caring she was naked, she has a fixation on new dog she recently adopted, etc. Awaiting psychiatric placement but can't get this until 05/06 d/t COVID, becomes tearful when informed she needs to stay here and can't go home.  05/04-05/05 (weekend): stable, still on IVC await psychiatry placement  05/06: 5 days post-COVID(+), working on placement. IV due to renew tomorrow    Consultants:  Psychiatry   Procedures: none      ASSESSMENT & PLAN:   Principal Problem:   Acute respiratory disease due to COVID-19 virus Active Problems:   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HCC)    Hypothyroidism   ADD (attention deficit disorder)   Depression with anxiety   Bizarre behavior  Acute metabolic / toxic encephalopathy d/t COVID infection, possible psychoactive medication polypharmacy along with THC - acute encephalopathy/delirium resolved but remains w/ psychiatric delusional d/o  See below re: psychiatric plan  Delirium and fall precautions   Acute respiratory disease due to COVID-19 virus:  Patient has oxygen desaturation to 89% on RA.   Chest showed interstitial prominence, indicating possible interstitial pneumonia.  BNP 36.2, less likely to have CHF.  Bronchodilators Solu-Medrol 40 mg twice daily --> po prednisone  As needed Robitussin Paxlovid to complete course    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HCC):  Wheezing, indicating COVID infection induced COPD exacerbation. Bronchodilators and steroids as above Incentive spirometry   Hypothyroidism:  TSH is elevated at 6.962 Increase Synthroid from 50 to 75 mcg daily Follow outpatient    ADD (attention deficit disorder), Depression with anxiety and Bizarre behavior: Consulted psychiatrist.  Patient meets criteria for psych inpatient treatment. As needed Xanax Seroquel Initial plan was to get her admitted to behavioral health unit 5 days after COVID dx but they cannot take her until 10 days. TOC and psychiatry tams working on alternative placement          DVT prophylaxis: lovenox  Pertinent IV fluids/nutrition: no continuous IV fluids  Central lines / invasive devices: none  Code Status: FULL CODE  ACP documentation reviewed: 05/03 none on file in [facility]  Current Admission Status: inpatient   TOC needs / Dispo plan: will go to psychiatry unit when medically stable  Barriers to discharge / significant pending items: COVID, encephalopathy, she is medially stable, will need to know protocol  for COVID patients in psychiatric unit             Subjective / Brief ROS:  Pt concerned about discharging today, no  physical concerns from pt or RN  Family Communication: none at this time     Objective Findings:  Vitals:   01/03/23 1905 01/04/23 0623 01/04/23 0756 01/04/23 1509  BP: (!) 168/97 (!) 159/92 134/86 (!) 151/98  Pulse: 86 92 91 92  Resp: 18 20 18 18   Temp: 98.3 F (36.8 C) 97.8 F (36.6 C)    TempSrc: Oral Oral    SpO2: 97% 91% 93% 95%  Weight:      Height:        Intake/Output Summary (Last 24 hours) at 01/04/2023 1510 Last data filed at 01/04/2023 1000 Gross per 24 hour  Intake 1110 ml  Output 0 ml  Net 1110 ml   Filed Weights   12/29/22 1818 01/01/23 0602  Weight: 54.4 kg 54.4 kg    Examination:  Physical Exam Constitutional:      General: She is not in acute distress. Cardiovascular:     Rate and Rhythm: Normal rate and regular rhythm.     Heart sounds: Normal heart sounds.  Pulmonary:     Effort: Pulmonary effort is normal.     Breath sounds: Normal breath sounds.  Psychiatric:        Speech: Speech is rapid and pressured.        Behavior: Behavior is hyperactive. Behavior is cooperative.          Scheduled Medications:   enoxaparin (LOVENOX) injection  40 mg Subcutaneous Q24H   Ipratropium-Albuterol  1 puff Inhalation Q6H WA   levothyroxine  75 mcg Oral Daily   QUEtiapine  400 mg Oral QHS    Continuous Infusions:   PRN Medications:  acetaminophen, ALPRAZolam, guaiFENesin, ondansetron (ZOFRAN) IV, oxyCODONE, temazepam  Antimicrobials from admission:  Anti-infectives (From admission, onward)    Start     Dose/Rate Route Frequency Ordered Stop   12/30/22 1000  nirmatrelvir/ritonavir (PAXLOVID) 3 tablet        3 tablet Oral 2 times daily 12/30/22 0909 01/03/23 2051           Data Reviewed:  I have personally reviewed the following...  CBC: Recent Labs  Lab 12/29/22 1822 12/31/22 1006 01/01/23 0500  WBC 8.3 8.3 8.1  NEUTROABS  --  5.6  --   HGB 12.2 12.2 11.7*  HCT 37.8 38.4 35.8*  MCV 93.6 93.7 89.7  PLT 364 322 354   Basic  Metabolic Panel: Recent Labs  Lab 12/29/22 1822 12/31/22 1006 01/01/23 0500  NA 142 140 138  K 4.0 3.8 3.9  CL 109 107 106  CO2 24 25 25   GLUCOSE 112* 155* 145*  BUN 25* 19 24*  CREATININE 0.86 0.81 0.67  CALCIUM 9.7 9.3 9.1   GFR: Estimated Creatinine Clearance: 51.7 mL/min (by C-G formula based on SCr of 0.67 mg/dL). Liver Function Tests: Recent Labs  Lab 12/29/22 1822 12/31/22 1006  AST 35 27  ALT 23 19  ALKPHOS 81 66  BILITOT 0.7 0.5  PROT 8.0 7.2  ALBUMIN 4.0 3.5   No results for input(s): "LIPASE", "AMYLASE" in the last 168 hours. No results for input(s): "AMMONIA" in the last 168 hours. Coagulation Profile: No results for input(s): "INR", "PROTIME" in the last 168 hours. Cardiac Enzymes: No results for input(s): "CKTOTAL", "CKMB", "CKMBINDEX", "TROPONINI" in the last 168 hours. BNP (last 3 results) No results  for input(s): "PROBNP" in the last 8760 hours. HbA1C: No results for input(s): "HGBA1C" in the last 72 hours. CBG: No results for input(s): "GLUCAP" in the last 168 hours. Lipid Profile: No results for input(s): "CHOL", "HDL", "LDLCALC", "TRIG", "CHOLHDL", "LDLDIRECT" in the last 72 hours. Thyroid Function Tests: No results for input(s): "TSH", "T4TOTAL", "FREET4", "T3FREE", "THYROIDAB" in the last 72 hours.  Anemia Panel: No results for input(s): "VITAMINB12", "FOLATE", "FERRITIN", "TIBC", "IRON", "RETICCTPCT" in the last 72 hours. Most Recent Urinalysis On File:     Component Value Date/Time   COLORURINE YELLOW (A) 09/05/2021 1324   APPEARANCEUR CLEAR (A) 09/05/2021 1324   APPEARANCEUR Clear 02/14/2014 2007   LABSPEC 1.026 09/05/2021 1324   LABSPEC 1.021 02/14/2014 2007   PHURINE 5.0 09/05/2021 1324   GLUCOSEU NEGATIVE 09/05/2021 1324   GLUCOSEU Negative 02/14/2014 2007   HGBUR NEGATIVE 09/05/2021 1324   BILIRUBINUR NEGATIVE 09/05/2021 1324   BILIRUBINUR NEGATIVE 06/14/2015 1419   BILIRUBINUR Negative 02/14/2014 2007   KETONESUR NEGATIVE  09/05/2021 1324   PROTEINUR NEGATIVE 09/05/2021 1324   UROBILINOGEN 0.2 06/14/2015 1419   UROBILINOGEN 0.2 03/10/2012 1900   NITRITE NEGATIVE 09/05/2021 1324   LEUKOCYTESUR NEGATIVE 09/05/2021 1324   LEUKOCYTESUR Negative 02/14/2014 2007   Sepsis Labs: @LABRCNTIP (procalcitonin:4,lacticidven:4) Microbiology: Recent Results (from the past 240 hour(s))  SARS Coronavirus 2 by RT PCR (hospital order, performed in [facility] hospital lab) *cepheid single result test* Anterior Nasal Swab     Status: Abnormal   Collection Time: 12/30/22  7:48 AM   Specimen: Anterior Nasal Swab  Result Value Ref Range Status   SARS Coronavirus 2 by RT PCR POSITIVE (A) NEGATIVE Final    Comment: (NOTE) SARS-CoV-2 target nucleic acids are DETECTED  SARS-CoV-2 RNA is generally detectable in upper respiratory specimens  during the acute phase of infection.  Positive results are indicative  of the presence of the identified virus, but do not rule out bacterial infection or co-infection with other pathogens not detected by the test.  Clinical correlation with patient history and  other diagnostic information is necessary to determine patient infection status.  The expected result is negative.  Fact Sheet for Patients:   RoadLapTop.co.za   Fact Sheet for Healthcare Providers:   http://kim-miller.com/    This test is not yet approved or cleared by the [place] FDA and  has been authorized for detection and/or diagnosis of SARS-CoV-2 by FDA under an Emergency Use Authorization (EUA).  This EUA will remain in effect (meaning this test can be used) for the duration of  the COVID-19 declaration under Section 564(b)(1)  of the Act, 21 U.S.C. section 360-bbb-3(b)(1), unless the authorization is terminated or revoked sooner.   Performed at [facility], [address] [place], [place] [address]       Radiology Studies last 3 days: No results  found.           LOS: 4 days      Sunnie Nielsen, DO [facility] 01/04/2023, 3:10 PM    Dictation software may have been used to generate the above note. Typos may occur and escape review in typed/dictated notes. Please contact Dr Lyn Hollingshead directly for clarity if needed.  Staff may message me via secure chat in Epic  but this may not receive an immediate response,  please page me for urgent matters!  If 7PM-7AM, please contact night coverage [email or web address]

## 2023-01-05 DIAGNOSIS — J069 Acute upper respiratory infection, unspecified: Secondary | ICD-10-CM

## 2023-01-05 DIAGNOSIS — U071 COVID-19: Principal | ICD-10-CM

## 2023-01-05 MED ORDER — ONDANSETRON HCL 4 MG PO TABS: 4.0000 mg | ORAL_TABLET | Freq: Three times a day (TID) | ORAL | 0 refills | Status: DC | PRN

## 2023-01-05 MED ORDER — LEVOTHYROXINE SODIUM 50 MCG PO TABS: 50.0000 ug | ORAL_TABLET | Freq: Every day | ORAL | 0 refills | Status: DC

## 2023-01-05 MED ORDER — VITAMIN D (ERGOCALCIFEROL) 1.25 MG (50000 UNIT) PO CAPS: 50000.0000 [IU] | ORAL_CAPSULE | ORAL | 0 refills | Status: DC

## 2023-01-05 MED ORDER — ALBUTEROL SULFATE HFA 108 (90 BASE) MCG/ACT IN AERS
INHALATION_SPRAY | RESPIRATORY_TRACT | 0 refills | Status: DC
Start: 2023-01-05 — End: 2023-04-12

## 2023-01-05 MED ORDER — ONDANSETRON HCL 4 MG PO TABS
4.0000 mg | ORAL_TABLET | Freq: Three times a day (TID) | ORAL | Status: DC | PRN
  Administered 2023-01-05: 4 mg via ORAL
  Filled 2023-01-05: qty 1

## 2023-01-05 MED ORDER — GUAIFENESIN 100 MG/5ML PO LIQD: 200.0000 mg | ORAL | 0 refills | Status: DC | PRN

## 2023-01-05 MED ORDER — QUETIAPINE FUMARATE 400 MG PO TABS: 400.0000 mg | ORAL_TABLET | Freq: Every day | ORAL | 0 refills | Status: AC

## 2023-01-05 NOTE — BH Assessment (Signed)
Writer re-faxed patient referral to [facility], correct fax# [phone].
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
## 2023-01-05 NOTE — Discharge Summary (Signed)
Physician Discharge Summary   Patient: Jamie Schultz MRN: [id number]  DOB: [birth year]   Admit:     Date of Admission: 12/29/2022 Admitted from: home   Discharge: Date of discharge: 01/05/23 Disposition: Home Condition at discharge: good  CODE STATUS: FULL CODE     Discharge Physician: Sunnie Nielsen, DO [facility]     PCP: Pcp, No  Recommendations for Outpatient Follow-up:  Follow up with PCP Pcp, No in 1-2 weeks Please obtain labs/tests: none needed Please follow up on the following pending results: none PCP AND OTHER OUTPATIENT PROVIDERS: SEE BELOW FOR SPECIFIC DISCHARGE INSTRUCTIONS PRINTED FOR PATIENT IN ADDITION TO GENERIC AVS PATIENT INFO  Needs psychiatric follow up    Discharge Instructions     Diet - low sodium heart healthy   Complete by: As directed    Increase activity slowly   Complete by: As directed          Discharge Diagnoses: Principal Problem:   Acute respiratory disease due to COVID-19 virus Active Problems:   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HCC)   Hypothyroidism   ADD (attention deficit disorder)   Depression with anxiety   Bizarre behavior       Hospital Course: Jamie Schultz is a 62 y.o. female with medical history significant of depression with anxiety, panic attack, ADHD, COPD, hypothyroidism, fibromyalgia and chronic pain, kidney stone, who presents to ED on 12/31/2022 from home with confusion, agitation, bizarre behavior.  04/30: brought to ED by law enforcement. Speaking incoherently to police, living in home that has needles and dog poop in it.  05/01: Psychiatrist is consulted in ED. Per psych NP Gillermo Murdoch, patient does meet criteria for geriatric-psychiatric inpatient admission, but pt is found to have positive COVID infection  05/02: oxygen desaturated to 89%. Admitted to hospitalist service given hypoxia. On evaluation by admitting hospitalist, she was confused and mildly agitated.  She was still orientated  x 3, but cannot focus in answering questions. (+)UDS amphetamine, benzo, cannabinoid and TCA. PDMP reviewed - Rx for alprazolam, dextroamphetamine-amphetamine, Belsomra.  05/03: she is oriented but seems to have paranoia, pressured speech, disorganized/tangential thought process - describes people breaking into her house, leaving things that aren't hers, cops coming in to her house and not caring she was naked, she has a fixation on new dog she recently adopted, etc. Awaiting psychiatric placement but can't get this until 05/06 d/t COVID, becomes tearful when informed she needs to stay here and can't go home.  05/04-05/05 (weekend): stable, still on IVC await psychiatry placement  05/06: 5 days post-COVID(+), working on placement. IV due to renew tomorrow  05/07: reevaluation w/ psychiatry, IVC lifted/discontinued and pt clear for discharge home    Consultants:  Psychiatry   Procedures: none      ASSESSMENT & PLAN:   Acute metabolic / toxic encephalopathy d/t COVID infection, possible psychoactive medication polypharmacy along with THC - acute encephalopathy/delirium resolved but remains w/ psychiatric delusional d/o - resolved See below re: psychiatric plan  Delirium and fall precautions   Acute respiratory disease due to COVID-19 virus:  Bronchodilators Solu-Medrol 40 mg twice daily --> po prednisone  As needed Robitussin Paxlovid to complete course    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HCC):  Wheezing, indicating COVID infection induced COPD exacerbation. Bronchodilators and steroids as above Incentive spirometry Follow outpatient    Hypothyroidism:  TSH is elevated at 6.962 Increase Synthroid from 50 to 75 mcg daily Follow outpatient    ADD (attention deficit disorder), Depression with anxiety  and Bizarre behavior: Consulted psychiatrist.  Patient meets criteria for psych inpatient treatment. As needed Xanax Seroquel Initial plan was to get her admitted to behavioral health unit 5  days after COVID dx but they cannot take her until 10 days. TOC and psychiatry tams working on alternative placement but then psychiatry reevaluated her today and lifted IVC                  Discharge Instructions  Allergies as of 01/05/2023       Reactions   Bee Venom Anaphylaxis, Other (See Comments)   Bees/wasps/yellow jackets   Keflex [cephalexin] Anaphylaxis   Penicillins Anaphylaxis, Other (See Comments)   Has patient had a PCN reaction causing immediate rash, facial/tongue/throat swelling, SOB or lightheadedness with hypotension: Yes Has patient had a PCN reaction causing severe rash involving mucus membranes or skin necrosis: No Has patient had a PCN reaction that required hospitalization Yes, in the hospital already Has patient had a PCN reaction occurring within the last 10 years: Yes If all of the above answers are "NO", then may proceed with Cephalosporin use.   Sulfa Antibiotics Other (See Comments)   Renal failure   Sulfur Other (See Comments)   "total renal failure" per pt   Hydrocodone Rash, Other (See Comments)   "I couldn't get my breath"   Ibuprofen Other (See Comments)   Vomiting blood   Sulfur Dioxide Hives   Contrast Media [iodinated Contrast Media] Other (See Comments)   Patient states she had a previous reaction to iodinated contrast media agents. Premedicate.   Nsaids Itching   Codeine Itching, Rash   Cyclobenzaprine Other (See Comments)   Severe constipation   Fentanyl Nausea And Vomiting, Other (See Comments)   Severe vomiting As of 01/06/21 pt states she is not allergic   Methadone Other (See Comments)   Change in mental status   Neurontin [gabapentin] Hives   Tape Itching, Rash, Other (See Comments)   Blisters skin, Please use "paper" tape   Tegretol [carbamazepine] Hives, Rash   Toradol [ketorolac Tromethamine] Hives, Rash   Tramadol Hives, Rash        Medication List     STOP taking these medications    Belsomra 20 MG  Tabs Generic drug: Suvorexant   Dulera 200-5 MCG/ACT Aero Generic drug: mometasone-formoterol   DULoxetine 60 MG capsule Commonly known as: CYMBALTA   linaclotide 145 MCG Caps capsule Commonly known as: LINZESS   Lyrica 100 MG capsule Generic drug: pregabalin   prochlorperazine 10 MG tablet Commonly known as: COMPAZINE   temazepam 15 MG capsule Commonly known as: RESTORIL       TAKE these medications    albuterol 108 (90 Base) MCG/ACT inhaler Commonly known as: ProAir HFA TAKE 2 PUFFS BY MOUTH EVERY 6 HOURS AS NEEDED FOR WHEEZE OR SHORTNESS OF BREATH   ALPRAZolam 1 MG tablet Commonly known as: XANAX Take 2 mg by mouth 2 (two) times daily as needed for anxiety.   amphetamine-dextroamphetamine 20 MG tablet Commonly known as: ADDERALL Take 20 mg by mouth daily.   EpiPen 2-Pak 0.3 mg/0.3 mL Soaj injection Generic drug: EPINEPHrine USE AS DIRECTED FOR ANAPYLACTIC REACTION (TO BEE STINGS)   guaiFENesin 100 MG/5ML liquid Commonly known as: ROBITUSSIN Take 10 mLs (200 mg total) by mouth every 4 (four) hours as needed for cough.   levothyroxine 50 MCG tablet Commonly known as: SYNTHROID Take 1 tablet (50 mcg total) by mouth daily.   ondansetron 4 MG tablet Commonly known as:  ZOFRAN Take 1 tablet (4 mg total) by mouth every 8 (eight) hours as needed for nausea or vomiting.   QUEtiapine 400 MG tablet Commonly known as: SEROQUEL Take 1 tablet (400 mg total) by mouth at bedtime.   Vitamin D (Ergocalciferol) 1.25 MG (50000 UNIT) Caps capsule Commonly known as: DRISDOL Take 1 capsule (50,000 Units total) by mouth once a week.          Allergies  Allergen Reactions   Bee Venom Anaphylaxis and Other (See Comments)    Bees/wasps/yellow jackets   Keflex [Cephalexin] Anaphylaxis   Penicillins Anaphylaxis and Other (See Comments)    Has patient had a PCN reaction causing immediate rash, facial/tongue/throat swelling, SOB or lightheadedness with hypotension:  Yes Has patient had a PCN reaction causing severe rash involving mucus membranes or skin necrosis: No Has patient had a PCN reaction that required hospitalization Yes, in the hospital already Has patient had a PCN reaction occurring within the last 10 years: Yes If all of the above answers are "NO", then may proceed with Cephalosporin use.    Sulfa Antibiotics Other (See Comments)    Renal failure   Sulfur Other (See Comments)    "total renal failure" per pt   Hydrocodone Rash and Other (See Comments)    "I couldn't get my breath"   Ibuprofen Other (See Comments)    Vomiting blood   Sulfur Dioxide Hives   Contrast Media [Iodinated Contrast Media] Other (See Comments)    Patient states she had a previous reaction to iodinated contrast media agents. Premedicate.   Nsaids Itching   Codeine Itching and Rash   Cyclobenzaprine Other (See Comments)    Severe constipation   Fentanyl Nausea And Vomiting and Other (See Comments)    Severe vomiting As of 01/06/21 pt states she is not allergic   Methadone Other (See Comments)    Change in mental status   Neurontin [Gabapentin] Hives   Tape Itching, Rash and Other (See Comments)    Blisters skin, Please use "paper" tape   Tegretol [Carbamazepine] Hives and Rash   Toradol [Ketorolac Tromethamine] Hives and Rash   Tramadol Hives and Rash     Subjective: pt has no cough, no fever/chills, she is feeling in good spirits since IVC has been d/c and she can go home   Discharge Exam: BP (!) 128/98 (BP Location: Right Arm)   Pulse 87   Temp 98.5 F (36.9 C) (Oral)   Resp 20   Ht 5' (1.524 m)   Wt 54.4 kg   SpO2 95%   BMI 23.44 kg/m  General: Pt is alert, awake, not in acute distress Cardiovascular: RRR, S1/S2 WNL, no rubs, no gallops Respiratory: CTA bilaterally, no wheezing, no rhonchi Abdominal: Soft, NT, ND, bowel sounds +WNL Extremities: no edema, no cyanosis     The results of significant diagnostics from this hospitalization  (including imaging, microbiology, ancillary and laboratory) are listed below for reference.     Microbiology: Recent Results (from the past 240 hour(s))  SARS Coronavirus 2 by RT PCR (hospital order, performed in [facility] hospital lab) *cepheid single result test* Anterior Nasal Swab     Status: Abnormal   Collection Time: 12/30/22  7:48 AM   Specimen: Anterior Nasal Swab  Result Value Ref Range Status   SARS Coronavirus 2 by RT PCR POSITIVE (A) NEGATIVE Final    Comment: (NOTE) SARS-CoV-2 target nucleic acids are DETECTED  SARS-CoV-2 RNA is generally detectable in upper respiratory specimens  during  the acute phase of infection.  Positive results are indicative  of the presence of the identified virus, but do not rule out bacterial infection or co-infection with other pathogens not detected by the test.  Clinical correlation with patient history and  other diagnostic information is necessary to determine patient infection status.  The expected result is negative.  Fact Sheet for Patients:   RoadLapTop.co.za   Fact Sheet for Healthcare Providers:   http://kim-miller.com/    This test is not yet approved or cleared by the [place] FDA and  has been authorized for detection and/or diagnosis of SARS-CoV-2 by FDA under an Emergency Use Authorization (EUA).  This EUA will remain in effect (meaning this test can be used) for the duration of  the COVID-19 declaration under Section 564(b)(1)  of the Act, 21 U.S.C. section 360-bbb-3(b)(1), unless the authorization is terminated or revoked sooner.   Performed at [facility], [address] [place], [place] [address]      Labs: BNP (last 3 results) Recent Labs    12/31/22 1006  BNP 36.2   Basic Metabolic Panel: Recent Labs  Lab 12/29/22 1822 12/31/22 1006 01/01/23 0500  NA 142 140 138  K 4.0 3.8 3.9  CL 109 107 106  CO2 24 25 25   GLUCOSE 112* 155* 145*   BUN 25* 19 24*  CREATININE 0.86 0.81 0.67  CALCIUM 9.7 9.3 9.1   Liver Function Tests: Recent Labs  Lab 12/29/22 1822 12/31/22 1006  AST 35 27  ALT 23 19  ALKPHOS 81 66  BILITOT 0.7 0.5  PROT 8.0 7.2  ALBUMIN 4.0 3.5   No results for input(s): "LIPASE", "AMYLASE" in the last 168 hours. No results for input(s): "AMMONIA" in the last 168 hours. CBC: Recent Labs  Lab 12/29/22 1822 12/31/22 1006 01/01/23 0500  WBC 8.3 8.3 8.1  NEUTROABS  --  5.6  --   HGB 12.2 12.2 11.7*  HCT 37.8 38.4 35.8*  MCV 93.6 93.7 89.7  PLT 364 322 354   Cardiac Enzymes: No results for input(s): "CKTOTAL", "CKMB", "CKMBINDEX", "TROPONINI" in the last 168 hours. BNP: Invalid input(s): "POCBNP" CBG: No results for input(s): "GLUCAP" in the last 168 hours. D-Dimer No results for input(s): "DDIMER" in the last 72 hours. Hgb A1c No results for input(s): "HGBA1C" in the last 72 hours. Lipid Profile No results for input(s): "CHOL", "HDL", "LDLCALC", "TRIG", "CHOLHDL", "LDLDIRECT" in the last 72 hours. Thyroid function studies No results for input(s): "TSH", "T4TOTAL", "T3FREE", "THYROIDAB" in the last 72 hours.  Invalid input(s): "FREET3" Anemia work up No results for input(s): "VITAMINB12", "FOLATE", "FERRITIN", "TIBC", "IRON", "RETICCTPCT" in the last 72 hours. Urinalysis    Component Value Date/Time   COLORURINE YELLOW (A) 09/05/2021 1324   APPEARANCEUR CLEAR (A) 09/05/2021 1324   APPEARANCEUR Clear 02/14/2014 2007   LABSPEC 1.026 09/05/2021 1324   LABSPEC 1.021 02/14/2014 2007   PHURINE 5.0 09/05/2021 1324   GLUCOSEU NEGATIVE 09/05/2021 1324   GLUCOSEU Negative 02/14/2014 2007   HGBUR NEGATIVE 09/05/2021 1324   BILIRUBINUR NEGATIVE 09/05/2021 1324   BILIRUBINUR NEGATIVE 06/14/2015 1419   BILIRUBINUR Negative 02/14/2014 2007   KETONESUR NEGATIVE 09/05/2021 1324   PROTEINUR NEGATIVE 09/05/2021 1324   UROBILINOGEN 0.2 06/14/2015 1419   UROBILINOGEN 0.2 03/10/2012 1900   NITRITE  NEGATIVE 09/05/2021 1324   LEUKOCYTESUR NEGATIVE 09/05/2021 1324   LEUKOCYTESUR Negative 02/14/2014 2007   Sepsis Labs Recent Labs  Lab 12/29/22 1822 12/31/22 1006 01/01/23 0500  WBC 8.3 8.3 8.1  Microbiology Recent Results (from the past 240 hour(s))  SARS Coronavirus 2 by RT PCR (hospital order, performed in [facility] hospital lab) *cepheid single result test* Anterior Nasal Swab     Status: Abnormal   Collection Time: 12/30/22  7:48 AM   Specimen: Anterior Nasal Swab  Result Value Ref Range Status   SARS Coronavirus 2 by RT PCR POSITIVE (A) NEGATIVE Final    Comment: (NOTE) SARS-CoV-2 target nucleic acids are DETECTED  SARS-CoV-2 RNA is generally detectable in upper respiratory specimens  during the acute phase of infection.  Positive results are indicative  of the presence of the identified virus, but do not rule out bacterial infection or co-infection with other pathogens not detected by the test.  Clinical correlation with patient history and  other diagnostic information is necessary to determine patient infection status.  The expected result is negative.  Fact Sheet for Patients:   RoadLapTop.co.za   Fact Sheet for Healthcare Providers:   http://kim-miller.com/    This test is not yet approved or cleared by the [place] FDA and  has been authorized for detection and/or diagnosis of SARS-CoV-2 by FDA under an Emergency Use Authorization (EUA).  This EUA will remain in effect (meaning this test can be used) for the duration of  the COVID-19 declaration under Section 564(b)(1)  of the Act, 21 U.S.C. section 360-bbb-3(b)(1), unless the authorization is terminated or revoked sooner.   Performed at [facility], [address] [place], [place] [address]    Imaging DG Chest [place] 1 View  Result Date: 12/29/2022 CLINICAL DATA:  10028 Wheezing 10028 EXAM: PORTABLE CHEST - 1 VIEW COMPARISON:  11/13/2019  FINDINGS: Cardiac silhouette is unremarkable. No pneumothorax or pleural effusion. The lungs are clear. Aorta is calcified. The visualized skeletal structures are unremarkable. IMPRESSION: No acute cardiopulmonary process. Electronically Signed   By: Layla Maw M.D.   On: 12/29/2022 20:17      Time coordinating discharge: over 30 minutes  SIGNED:  Sunnie Nielsen DO [facility]

## 2023-01-05 NOTE — Plan of Care (Signed)

## 2023-01-05 NOTE — TOC Initial Note (Signed)
Transition of Care [facility]) - Initial/Assessment Note    Patient Details  Name: Jamie Schultz MRN: [id number] Date of Birth: Mar 05, 1961  Transition of [facility]) CM/SW Contact:    Chapman Fitch, RN Phone Number: 01/05/2023, 12:52 PM  Clinical Narrative:                  Notified my psych that patient has been cleared for discharge home IVC to be rescinded Met with patient at bedside Patient states that she lives at home alone, and son lives next door Patient to follow up with Medicaid about assigning her a PCP  Patient states that she will be calling a cab through [place] to get home today.  MD and Bedside RN notified    Call placed to APS to notify of discharge.  Was unable to reach anyone or leave a message.  Email notification sent to [facility] with [facility]PS Expected Discharge Plan: Home/Self Care Barriers to Discharge: Barriers Resolved   Patient Goals and CMS Choice            Expected Discharge Plan and Services         Expected Discharge Date: 01/05/23                                    Prior Living Arrangements/Services   Lives with:: Self                   Activities of Daily Living Home Assistive Devices/Equipment: None ADL Screening (condition at time of admission) Patient's cognitive ability adequate to safely complete daily activities?: Yes Is the patient deaf or have difficulty hearing?: No Does the patient have difficulty seeing, even when wearing glasses/contacts?: No Does the patient have difficulty concentrating, remembering, or making decisions?: No Patient able to express need for assistance with ADLs?: Yes Does the patient have difficulty dressing or bathing?: No Independently performs ADLs?: Yes (appropriate for developmental age) Does the patient have difficulty walking or climbing stairs?: No Weakness of Legs: None Weakness of Arms/Hands: None  Permission Sought/Granted                  Emotional Assessment        Orientation: : Oriented to Self, Oriented to Place, Oriented to  Time, Oriented to Situation   Psych Involvement: Yes (comment)  Admission diagnosis:  Agitation [R45.1] Acute respiratory disease due to COVID-19 virus [U07.1, J06.9] Patient Active Problem List   Diagnosis Date Noted   Acute respiratory disease due to COVID-19 virus 12/31/2022   Depression with anxiety 12/31/2022   Bizarre behavior 12/31/2022   Abscess of abdominal wall 09/01/2021   Swelling of eyelid, right 09/01/2021   Anxiety 09/01/2021   ADD (attention deficit disorder) 09/01/2021   Laceration of plantar aspect of foot, left, initial encounter 09/01/2021   Cellulitis and abscess of left leg 01/01/2021   Cellulitis and abscess of foot 12/29/2020   Encephalopathy 11/13/2019   History of colonic polyps    Polyp of sigmoid colon    Rectal polyp    S/P total knee arthroplasty 02/23/2018   Primary osteoarthritis of left knee 12/19/2017   Smoker 09/21/2017   Aortic atherosclerosis (HCC) 09/18/2017   Disorder of bone, unspecified 07/27/2017   Other long term (current) drug therapy 07/27/2017   Other specified health status 07/27/2017   Chronic pain syndrome 07/27/2017   Chronic low back pain [facility] Area of  Pain) (B (L>R) 07/27/2017   Chronic neck pain (Primary Area of Pain) (Bilateral) (L>R) 07/27/2017   Chronic bilateral thoracic back pain (Secondary Area of Pain) (B (L>R) 07/27/2017   Chronic left shoulder pain (Fourth Area of Pain) 07/27/2017   Chronic pain of lower extremity (Bilateral) (L>R) 07/27/2017   Bilateral chronic knee pain (B (L>R) 07/27/2017   LAD (lymphadenopathy) of right cervical region 12/16/2016   Skin lesions 12/16/2016   Family history of melanoma 12/16/2016   Swelling of both lower extremities 10/07/2016   S/P shoulder replacement 09/29/2016   Abnormality of gait 07/28/2016   Paresthesia 07/28/2016   Bilateral primary osteoarthritis of knee 07/07/2016   Prediabetes 04/24/2016    Spinal stenosis of thoracolumbar region 03/24/2016   Elevated LFTs 09/04/2015   Spondylolisthesis of lumbar region 07/31/2015   Cervical stenosis of spinal canal 07/31/2015   Low libido 06/26/2015   Muscle wasting 06/14/2015   Nausea in adult patient 06/14/2015   Therapeutic opioid induced constipation 06/14/2015   Blood in stool    Benign neoplasm of descending colon    Rectal prolapse 04/20/2015   Chronic pain of multiple joints 04/19/2015   Left rotator cuff tear arthropathy 03/28/2015   Complete tear of left rotator cuff 01/31/2015   False positive serological test for hepatitis C 03/10/2012   Hypothyroidism 02/29/2012   Vitamin D deficiency 02/29/2012   Atrophic vaginitis 02/29/2012   Hyperlipidemia 02/11/2012   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   Major depressive disorder, recurrent episode, 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HCC) 01/29/2012   Chronic back pain    PCP:  Pcp, No Pharmacy:   [id number] - [place], [place] - [address] [place] [address] Phone: [phone] Fax: [phone]     Social Determinants of Health (SDOH) Social History: SDOH Screenings   Food Insecurity: No Food Insecurity (01/01/2023)  Housing: Low Risk  (01/01/2023)  Transportation Needs: No Transportation Needs (01/01/2023)  Utilities: Not At Risk (01/01/2023)  Financial Resource Strain: Low Risk  (07/27/2017)  Physical Activity: Unknown (07/27/2017)  Social Connections: Unknown (07/27/2017)  Stress: Stress Concern Present (07/27/2017)  Tobacco Use: Medium Risk (01/01/2023)   SDOH Interventions:     Readmission Risk Interventions    01/02/2021   11:22 AM  Readmission Risk Prevention Plan  Transportation Screening Complete  PCP or Specialist Appt within 3-5 Days Complete  Social Work Consult for Recovery Care Planning/Counseling Complete  Palliative Care Screening Not Applicable  Medication Review Oceanographer) Complete

## 2023-02-09 ENCOUNTER — Ambulatory Visit: Payer: Medicaid Other | Admitting: Internal Medicine

## 2023-02-09 ENCOUNTER — Encounter: Payer: Self-pay | Admitting: Internal Medicine

## 2023-02-09 VITALS — BP 142/60 | HR 92 | Ht 60.0 in | Wt 125.0 lb

## 2023-02-09 DIAGNOSIS — F17200 Nicotine dependence, unspecified, uncomplicated: Secondary | ICD-10-CM

## 2023-02-09 DIAGNOSIS — E782 Mixed hyperlipidemia: Secondary | ICD-10-CM | POA: Diagnosis not present

## 2023-02-09 DIAGNOSIS — E038 Other specified hypothyroidism: Secondary | ICD-10-CM

## 2023-02-09 DIAGNOSIS — R739 Hyperglycemia, unspecified: Secondary | ICD-10-CM | POA: Diagnosis not present

## 2023-02-09 DIAGNOSIS — I10 Essential (primary) hypertension: Secondary | ICD-10-CM | POA: Diagnosis not present

## 2023-02-09 DIAGNOSIS — K219 Gastro-esophageal reflux disease without esophagitis: Secondary | ICD-10-CM

## 2023-02-09 DIAGNOSIS — J438 Other emphysema: Secondary | ICD-10-CM

## 2023-02-09 DIAGNOSIS — M797 Fibromyalgia: Secondary | ICD-10-CM | POA: Insufficient documentation

## 2023-02-09 MED ORDER — BREZTRI AEROSPHERE 160-9-4.8 MCG/ACT IN AERO
2.0000 | INHALATION_SPRAY | Freq: Two times a day (BID) | RESPIRATORY_TRACT | 2 refills | Status: DC
Start: 2023-02-09 — End: 2023-12-16

## 2023-02-09 MED ORDER — GABAPENTIN 100 MG PO CAPS
100.0000 mg | ORAL_CAPSULE | Freq: Every day | ORAL | 2 refills | Status: DC
Start: 2023-02-09 — End: 2023-02-19

## 2023-02-09 MED ORDER — SPIRONOLACTONE 25 MG PO TABS
25.0000 mg | ORAL_TABLET | Freq: Two times a day (BID) | ORAL | 11 refills | Status: DC
Start: 2023-02-09 — End: 2023-02-19
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
## 2023-02-19 ENCOUNTER — Encounter: Payer: Self-pay | Admitting: Internal Medicine

## 2023-02-19 ENCOUNTER — Ambulatory Visit: Payer: Medicaid Other | Admitting: Internal Medicine

## 2023-02-19 VITALS — BP 108/50 | HR 93 | Ht 60.0 in | Wt 127.4 lb

## 2023-02-19 DIAGNOSIS — J438 Other emphysema: Secondary | ICD-10-CM

## 2023-02-19 DIAGNOSIS — I10 Essential (primary) hypertension: Secondary | ICD-10-CM | POA: Diagnosis not present

## 2023-02-19 DIAGNOSIS — K219 Gastro-esophageal reflux disease without esophagitis: Secondary | ICD-10-CM

## 2023-02-19 DIAGNOSIS — F17200 Nicotine dependence, unspecified, uncomplicated: Secondary | ICD-10-CM

## 2023-02-19 DIAGNOSIS — G8929 Other chronic pain: Secondary | ICD-10-CM

## 2023-02-19 DIAGNOSIS — E782 Mixed hyperlipidemia: Secondary | ICD-10-CM | POA: Diagnosis not present

## 2023-02-19 DIAGNOSIS — M79672 Pain in left foot: Secondary | ICD-10-CM

## 2023-02-19 DIAGNOSIS — M79671 Pain in right foot: Secondary | ICD-10-CM

## 2023-02-19 DIAGNOSIS — Z1231 Encounter for screening mammogram for malignant neoplasm of breast: Secondary | ICD-10-CM

## 2023-02-19 DIAGNOSIS — M2550 Pain in unspecified joint: Secondary | ICD-10-CM

## 2023-02-19 DIAGNOSIS — M4316 Spondylolisthesis, lumbar region: Secondary | ICD-10-CM

## 2023-02-19 DIAGNOSIS — E038 Other specified hypothyroidism: Secondary | ICD-10-CM

## 2023-02-19 DIAGNOSIS — Z13820 Encounter for screening for osteoporosis: Secondary | ICD-10-CM | POA: Insufficient documentation

## 2023-02-19 DIAGNOSIS — M797 Fibromyalgia: Secondary | ICD-10-CM

## 2023-02-19 MED ORDER — LEVOTHYROXINE SODIUM 50 MCG PO TABS: 50.0000 ug | ORAL_TABLET | Freq: Every day | ORAL | 3 refills | Status: DC

## 2023-02-19 MED ORDER — IPRATROPIUM-ALBUTEROL 0.5-2.5 (3) MG/3ML IN SOLN
3.0000 mL | Freq: Four times a day (QID) | RESPIRATORY_TRACT | 3 refills | Status: DC | PRN
Start: 2023-02-19 — End: 2023-12-16

## 2023-02-19 MED ORDER — METHYLPREDNISOLONE 4 MG PO TBPK
ORAL_TABLET | ORAL | 0 refills | Status: DC
Start: 2023-02-19 — End: 2023-04-09

## 2023-02-19 MED ORDER — PREGABALIN 25 MG PO CAPS
25.0000 mg | ORAL_CAPSULE | Freq: Two times a day (BID) | ORAL | 2 refills | Status: DC
Start: 2023-02-19 — End: 2023-04-09
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
## 2023-02-22 ENCOUNTER — Telehealth: Payer: Self-pay
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
## 2023-02-23 ENCOUNTER — Ambulatory Visit: Payer: Medicaid Other | Admitting: Internal Medicine

## 2023-02-25 ENCOUNTER — Encounter: Payer: Self-pay | Admitting: Internal Medicine

## 2023-02-25 ENCOUNTER — Ambulatory Visit: Payer: Medicaid Other | Admitting: Internal Medicine

## 2023-02-25 VITALS — BP 110/75 | HR 99 | Ht 60.0 in | Wt 127.0 lb

## 2023-02-25 DIAGNOSIS — M797 Fibromyalgia: Secondary | ICD-10-CM | POA: Diagnosis not present

## 2023-02-25 DIAGNOSIS — Z8619 Personal history of other infectious and parasitic diseases: Secondary | ICD-10-CM

## 2023-02-25 DIAGNOSIS — R8290 Unspecified abnormal findings in urine: Secondary | ICD-10-CM | POA: Insufficient documentation

## 2023-02-25 DIAGNOSIS — G894 Chronic pain syndrome: Secondary | ICD-10-CM

## 2023-02-25 DIAGNOSIS — Z13820 Encounter for screening for osteoporosis: Secondary | ICD-10-CM

## 2023-02-25 LAB — POCT URINALYSIS DIPSTICK
Blood, UA: NEGATIVE
Glucose, UA: NEGATIVE
Leukocytes, UA: NEGATIVE
Nitrite, UA: NEGATIVE
Protein, UA: POSITIVE — AB
Spec Grav, UA: >=1.03 — AB (ref 1.010–1.025)
Urobilinogen, UA: 0.2 E.U./dL
pH, UA: 6 (ref 5.0–8.0)

## 2023-02-25 MED ORDER — ALENDRONATE SODIUM 70 MG PO TABS: 70.0000 mg | ORAL_TABLET | ORAL | 11 refills | Status: DC
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
## 2023-02-26 ENCOUNTER — Other Ambulatory Visit: Payer: Self-pay | Admitting: Internal Medicine

## 2023-02-26 DIAGNOSIS — E038 Other specified hypothyroidism: Secondary | ICD-10-CM

## 2023-02-26 LAB — CMP14+EGFR
ALT: 23 IU/L (ref 0–32)
AST: 24 IU/L (ref 0–40)
Albumin: 4.2 g/dL (ref 3.9–4.9)
Alkaline Phosphatase: 95 IU/L (ref 44–121)
BUN/Creatinine Ratio: 20 (ref 12–28)
BUN: 26 mg/dL (ref 8–27)
Bilirubin Total: <0.2 mg/dL (ref 0.0–1.2)
CO2: 25 mmol/L (ref 20–29)
Calcium: 10.2 mg/dL (ref 8.7–10.3)
Chloride: 98 mmol/L (ref 96–106)
Creatinine, Ser: 1.28 mg/dL — ABNORMAL HIGH (ref 0.57–1.00)
Globulin, Total: 3.1 g/dL (ref 1.5–4.5)
Glucose: 111 mg/dL — ABNORMAL HIGH (ref 70–99)
Potassium: 4.7 mmol/L (ref 3.5–5.2)
Sodium: 139 mmol/L (ref 134–144)
Total Protein: 7.3 g/dL (ref 6.0–8.5)
eGFR: 47 mL/min/{1.73_m2} — ABNORMAL LOW (ref >59)

## 2023-02-26 LAB — CBC WITH DIFFERENTIAL
Basophils Absolute: 0.1 10*3/uL (ref 0.0–0.2)
Basos: 1 %
EOS (ABSOLUTE): 1.1 10*3/uL — ABNORMAL HIGH (ref 0.0–0.4)
Eos: 9 %
Hematocrit: 38 % (ref 34.0–46.6)
Hemoglobin: 12.7 g/dL (ref 11.1–15.9)
Immature Grans (Abs): 0.1 10*3/uL (ref 0.0–0.1)
Immature Granulocytes: 1 %
Lymphocytes Absolute: 3.3 10*3/uL — ABNORMAL HIGH (ref 0.7–3.1)
Lymphs: 26 %
MCH: 29.3 pg (ref 26.6–33.0)
MCHC: 33.4 g/dL (ref 31.5–35.7)
MCV: 88 fL (ref 79–97)
Monocytes Absolute: 1 10*3/uL — ABNORMAL HIGH (ref 0.1–0.9)
Monocytes: 8 %
Neutrophils Absolute: 7.2 10*3/uL — ABNORMAL HIGH (ref 1.4–7.0)
Neutrophils: 55 %
RBC: 4.34 x10E6/uL (ref 3.77–5.28)
RDW: 15.3 % (ref 11.7–15.4)
WBC: 12.8 10*3/uL — ABNORMAL HIGH (ref 3.4–10.8)

## 2023-02-26 LAB — TSH+T4F+T3FREE
Free T4: 0.68 ng/dL — ABNORMAL LOW (ref 0.82–1.77)
T3, Free: 2.3 pg/mL (ref 2.0–4.4)
TSH: 10.6 u[IU]/mL — ABNORMAL HIGH (ref 0.450–4.500)

## 2023-02-26 LAB — HEMOGLOBIN A1C
Est. average glucose Bld gHb Est-mCnc: 131 mg/dL
Hgb A1c MFr Bld: 6.2 % — ABNORMAL HIGH (ref 4.8–5.6)

## 2023-02-26 LAB — LIPID PANEL
Chol/HDL Ratio: 2.8 ratio (ref 0.0–4.4)
Cholesterol, Total: 231 mg/dL — ABNORMAL HIGH (ref 100–199)
HDL: 84 mg/dL (ref >39)
LDL Chol Calc (NIH): 127 mg/dL — ABNORMAL HIGH (ref 0–99)
Triglycerides: 115 mg/dL (ref 0–149)
VLDL Cholesterol Cal: 20 mg/dL (ref 5–40)

## 2023-02-26 MED ORDER — LEVOTHYROXINE SODIUM 88 MCG PO TABS
88.0000 ug | ORAL_TABLET | Freq: Every day | ORAL | 11 refills | Status: DC
Start: 2023-02-26 — End: 2023-12-16

## 2023-03-01 NOTE — Progress Notes (Signed)
LVM requesting call back in reference to patients labs.

## 2023-03-02 ENCOUNTER — Other Ambulatory Visit: Payer: Medicaid Other

## 2023-03-03 ENCOUNTER — Telehealth: Payer: Self-pay

## 2023-03-03 NOTE — Progress Notes (Signed)
Spoke with patient about labs and  who has verbalized understanding; will schedule F/U labs before ending call.

## 2023-03-08 ENCOUNTER — Telehealth: Payer: Self-pay | Admitting: Internal Medicine

## 2023-03-08 NOTE — Telephone Encounter (Signed)
Patient left VM wanting to know why she was being referred to [facility] in [place] - states she does not have transportation to [place]. Also wants to know about being prescribed the one month treatment for Hep C. Please advise.

## 2023-03-18 NOTE — Telephone Encounter (Signed)
Pt.notified

## 2023-03-19 ENCOUNTER — Other Ambulatory Visit: Payer: Self-pay | Admitting: Orthopedic Surgery

## 2023-03-19 DIAGNOSIS — Z01818 Encounter for other preprocedural examination: Secondary | ICD-10-CM

## 2023-03-22 ENCOUNTER — Telehealth: Payer: Self-pay | Admitting: Internal Medicine

## 2023-03-22 ENCOUNTER — Other Ambulatory Visit: Payer: Medicaid Other

## 2023-03-22 NOTE — Telephone Encounter (Signed)
Patient left VM that she needs a surgery clearance that was sent to [place] from ortho completed for a shoulder replacement surgery. Please advise.

## 2023-03-31 ENCOUNTER — Telehealth: Payer: Self-pay | Admitting: Internal Medicine

## 2023-03-31 ENCOUNTER — Other Ambulatory Visit: Payer: Medicaid Other

## 2023-03-31 NOTE — Telephone Encounter (Signed)
Patient called in and she is experiencing nausea and unable to keep food on her stomach. She is requesting that we send her in some Phenergen. Please advise.  [facility]

## 2023-04-01 ENCOUNTER — Other Ambulatory Visit: Payer: Self-pay

## 2023-04-01 ENCOUNTER — Other Ambulatory Visit: Payer: Self-pay | Admitting: Internal Medicine

## 2023-04-01 DIAGNOSIS — E038 Other specified hypothyroidism: Secondary | ICD-10-CM

## 2023-04-01 MED ORDER — PROMETHAZINE HCL 25 MG PO TABS: 25.0000 mg | ORAL_TABLET | Freq: Four times a day (QID) | ORAL | 0 refills | Status: DC | PRN

## 2023-04-02 ENCOUNTER — Ambulatory Visit: Payer: Medicaid Other | Admitting: Internal Medicine

## 2023-04-05 ENCOUNTER — Telehealth: Payer: Self-pay | Admitting: Internal Medicine

## 2023-04-05 NOTE — Telephone Encounter (Signed)
Patient called stating we should have received a surgery clearance from the ortho doctor. I seen we received notes but nothing requiring a signature that I saw. She will ask them to refax the paperwork.  She also needs a new albuterol inhaler. Is out of pregabalin, tizanidine and an EpiPen. Also states she lost her BP meds and needs those.  [facility]

## 2023-04-09 ENCOUNTER — Other Ambulatory Visit: Payer: Self-pay | Admitting: Internal Medicine

## 2023-04-09 ENCOUNTER — Ambulatory Visit (INDEPENDENT_AMBULATORY_CARE_PROVIDER_SITE_OTHER): Payer: Medicaid Other | Admitting: Internal Medicine

## 2023-04-09 ENCOUNTER — Encounter: Payer: Self-pay | Admitting: Internal Medicine

## 2023-04-09 VITALS — BP 118/68 | HR 72 | Ht 60.0 in | Wt 127.6 lb

## 2023-04-09 DIAGNOSIS — J40 Bronchitis, not specified as acute or chronic: Secondary | ICD-10-CM

## 2023-04-09 DIAGNOSIS — R300 Dysuria: Secondary | ICD-10-CM | POA: Diagnosis not present

## 2023-04-09 DIAGNOSIS — R053 Chronic cough: Secondary | ICD-10-CM

## 2023-04-09 DIAGNOSIS — E038 Other specified hypothyroidism: Secondary | ICD-10-CM | POA: Diagnosis not present

## 2023-04-09 DIAGNOSIS — Z91030 Bee allergy status: Secondary | ICD-10-CM | POA: Diagnosis not present

## 2023-04-09 DIAGNOSIS — M797 Fibromyalgia: Secondary | ICD-10-CM

## 2023-04-09 LAB — POCT URINALYSIS DIPSTICK
Bilirubin, UA: NEGATIVE
Blood, UA: NEGATIVE
Glucose, UA: NEGATIVE
Ketones, UA: NEGATIVE
Leukocytes, UA: NEGATIVE
Nitrite, UA: NEGATIVE
Protein, UA: NEGATIVE
Spec Grav, UA: 1.025 (ref 1.010–1.025)
Urobilinogen, UA: 0.2 E.U./dL
pH, UA: 6.5 (ref 5.0–8.0)

## 2023-04-09 MED ORDER — PREGABALIN 50 MG PO CAPS
50.0000 mg | ORAL_CAPSULE | Freq: Three times a day (TID) | ORAL | 2 refills | Status: DC
Start: 2023-04-09 — End: 2023-09-10

## 2023-04-09 MED ORDER — LEVOFLOXACIN 500 MG PO TABS
500.0000 mg | ORAL_TABLET | Freq: Every day | ORAL | 0 refills | Status: AC
Start: 2023-04-09 — End: 2023-04-16

## 2023-04-09 MED ORDER — EPINEPHRINE 0.3 MG/0.3ML IJ SOAJ
0.3000 mg | INTRAMUSCULAR | 3 refills | Status: AC | PRN
Start: 2023-04-09 — End: ?
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
## 2023-04-10 ENCOUNTER — Other Ambulatory Visit: Payer: Self-pay | Admitting: Internal Medicine

## 2023-04-10 DIAGNOSIS — E038 Other specified hypothyroidism: Secondary | ICD-10-CM

## 2023-04-12 ENCOUNTER — Other Ambulatory Visit: Payer: Self-pay | Admitting: Internal Medicine

## 2023-04-12 DIAGNOSIS — J432 Centrilobular emphysema: Secondary | ICD-10-CM

## 2023-04-12 MED ORDER — ALBUTEROL SULFATE HFA 108 (90 BASE) MCG/ACT IN AERS
INHALATION_SPRAY | RESPIRATORY_TRACT | 0 refills | Status: AC
Start: 2023-04-12 — End: ?

## 2023-04-16 ENCOUNTER — Telehealth: Payer: Self-pay

## 2023-04-16 NOTE — Telephone Encounter (Signed)
Spoke to pt who verbalized understanding.  

## 2023-04-19 DIAGNOSIS — M899 Disorder of bone, unspecified: Secondary | ICD-10-CM | POA: Insufficient documentation

## 2023-04-19 DIAGNOSIS — Z79899 Other long term (current) drug therapy: Secondary | ICD-10-CM | POA: Insufficient documentation

## 2023-04-19 DIAGNOSIS — Z789 Other specified health status: Secondary | ICD-10-CM | POA: Insufficient documentation

## 2023-04-19 NOTE — Progress Notes (Unsigned)
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 technology used. I apologize for any typographical errors that were not detected and corrected.) Date: 04/21/2023; Time: 6:59 AM

## 2023-04-19 NOTE — Patient Instructions (Signed)

## 2023-04-21 ENCOUNTER — Other Ambulatory Visit: Payer: Medicaid Other

## 2023-04-21 ENCOUNTER — Ambulatory Visit (HOSPITAL_BASED_OUTPATIENT_CLINIC_OR_DEPARTMENT_OTHER): Payer: Medicaid Other | Admitting: Pain Medicine

## 2023-04-21 ENCOUNTER — Encounter: Payer: Self-pay | Admitting: Orthopedic Surgery

## 2023-04-21 DIAGNOSIS — Z96652 Presence of left artificial knee joint: Secondary | ICD-10-CM | POA: Insufficient documentation

## 2023-04-21 DIAGNOSIS — Z91199 Patient's noncompliance with other medical treatment and regimen due to unspecified reason: Secondary | ICD-10-CM

## 2023-04-21 DIAGNOSIS — Z79899 Other long term (current) drug therapy: Secondary | ICD-10-CM

## 2023-04-21 DIAGNOSIS — Z789 Other specified health status: Secondary | ICD-10-CM

## 2023-04-21 DIAGNOSIS — G8929 Other chronic pain: Secondary | ICD-10-CM

## 2023-04-21 DIAGNOSIS — M961 Postlaminectomy syndrome, not elsewhere classified: Secondary | ICD-10-CM | POA: Insufficient documentation

## 2023-04-21 DIAGNOSIS — G894 Chronic pain syndrome: Secondary | ICD-10-CM

## 2023-04-21 DIAGNOSIS — E559 Vitamin D deficiency, unspecified: Secondary | ICD-10-CM

## 2023-04-21 DIAGNOSIS — M899 Disorder of bone, unspecified: Secondary | ICD-10-CM

## 2023-04-22 ENCOUNTER — Ambulatory Visit: Payer: Medicaid Other | Admitting: Internal Medicine

## 2023-04-23 ENCOUNTER — Ambulatory Visit
Admission: RE | Admit: 2023-04-23 | Discharge: 2023-04-23 | Disposition: A | Discharge status: Home / Self Care | Payer: Medicaid Other | Source: Ambulatory Visit | Attending: Orthopedic Surgery | Admitting: Orthopedic Surgery

## 2023-04-23 ENCOUNTER — Ambulatory Visit: Payer: Medicaid Other | Attending: Internal Medicine

## 2023-04-23 DIAGNOSIS — Z01818 Encounter for other preprocedural examination: Secondary | ICD-10-CM

## 2023-04-23 NOTE — Progress Notes (Signed)
Spoke with pt who verbalized understanding.
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
## 2023-05-05 ENCOUNTER — Ambulatory Visit: Payer: Medicaid Other | Admitting: Physician Assistant

## 2023-05-05 DIAGNOSIS — Z7689 Persons encountering health services in other specified circumstances: Secondary | ICD-10-CM

## 2023-05-05 DIAGNOSIS — F418 Other specified anxiety disorders: Secondary | ICD-10-CM

## 2023-05-05 DIAGNOSIS — E559 Vitamin D deficiency, unspecified: Secondary | ICD-10-CM

## 2023-05-05 DIAGNOSIS — I10 Essential (primary) hypertension: Secondary | ICD-10-CM

## 2023-05-05 DIAGNOSIS — E039 Hypothyroidism, unspecified: Secondary | ICD-10-CM

## 2023-05-19 ENCOUNTER — Telehealth: Payer: Self-pay

## 2023-05-21 ENCOUNTER — Telehealth: Payer: Self-pay

## 2023-05-21 NOTE — Telephone Encounter (Signed)
Patient called about a surgery clearance form that she said wasn't faxed to her surgeon, do you know anything about this

## 2023-05-25 ENCOUNTER — Telehealth: Payer: Self-pay | Admitting: Internal Medicine

## 2023-05-25 NOTE — Telephone Encounter (Signed)
Pt needs surgery clearance sent to DR. Dion Saucier

## 2023-05-26 NOTE — Telephone Encounter (Signed)
Addressed.

## 2023-06-01 ENCOUNTER — Other Ambulatory Visit: Payer: Self-pay | Admitting: Internal Medicine

## 2023-06-01 DIAGNOSIS — J432 Centrilobular emphysema: Secondary | ICD-10-CM

## 2023-06-10 ENCOUNTER — Ambulatory Visit: Payer: Medicaid Other | Admitting: Internal Medicine

## 2023-06-15 NOTE — Progress Notes (Addendum)
Anesthesia Review:  PCP: DR Marlin Canary LOV 04/09/23 \\Clearance  05/20/23 on chart  Cardiologist : Chest x-ray : 12/31/22- 1 view  EKG : 12/30/22  Echo : Stress test: Cardiac Cath :  Activity level:  Sleep Study/ CPAP : Fasting Blood Sugar :      / Checks Blood Sugar -- times a day:   Blood Thinner/ Instructions /Last Dose: ASA / Instructions/ Last Dose :    06/17/23- LVMM at 1304pm 06/17/23- LVMM at 1316pm .  06/17/23- Mailbox full at 1330pm .  Never shown in Admitting.

## 2023-06-15 NOTE — Patient Instructions (Signed)
SURGICAL WAITING ROOM VISITATION  Patients having surgery or a procedure may have no more than 2 support people in the waiting area - these visitors may rotate.    Children under the age of 49 must have an adult with them who is not the patient.  Due to an increase in RSV and influenza rates and associated hospitalizations, children ages 89 and under may not visit patients in [facility] hospitals.  If the patient needs to stay at the hospital during part of their recovery, the visitor guidelines for inpatient rooms apply. Pre-op nurse will coordinate an appropriate time for 1 support person to accompany patient in pre-op.  This support person may not rotate.    Please refer to the [facility] website for the visitor guidelines for Inpatients (after your surgery is over and you are in a regular room).       Your procedure is scheduled on:  06/29/23    Report to [facility] Main Entrance    Report to admitting at   0800AM   Call this number if you have problems the morning of surgery [phone]   Do not eat food :After Midnight.   After Midnight you may have the following liquids until __ 0730____ AM/  DAY OF SURGERY  Water Non-Citrus Juices (without pulp, NO RED-Apple, White grape, White cranberry) Black Coffee (NO MILK/CREAM OR CREAMERS, sugar ok)  Clear Tea (NO MILK/CREAM OR CREAMERS, sugar ok) regular and decaf                             Plain Jell-O (NO RED)                                           Fruit ices (not with fruit pulp, NO RED)                                     Popsicles (NO RED)                                                               Sports drinks like Gatorade (NO RED)                    Drink ONE (1) Pre-Surgery Clear Ensure or G2 at  0730 AM ( have completed by )  the morning of surgery. Drink in one sitting. Do not sip.  This drink was given to you during your hospital  pre-op appointment visit. Nothing else to drink after completing  the  Pre-Surgery Clear Ensure or G2.          If you have questions, please contact your surgeon's office.      Oral Hygiene is also important to reduce your risk of infection.                                    Remember - BRUSH YOUR TEETH THE MORNING OF SURGERY WITH YOUR REGULAR TOOTHPASTE  DENTURES  WILL BE REMOVED PRIOR TO SURGERY PLEASE DO NOT APPLY "Poly grip" OR ADHESIVES!!!   Do NOT smoke after Midnight   Stop all vitamins and herbal supplements 7 days before surgery.   Take these medicines the morning of surgery with A SIP OF WATER:  inhalers as usual and bring, synthroid., lyrica., gabapentin, nebulizer if needed   DO NOT TAKE ANY ORAL DIABETIC MEDICATIONS DAY OF YOUR SURGERY  Bring CPAP mask and tubing day of surgery.                              You may not have any metal on your body including hair pins, jewelry, and body piercing             Do not wear make-up, lotions, powders, perfumes/cologne, or deodorant  Do not wear nail polish including gel and S&S, artificial/acrylic nails, or any other type of covering on natural nails including finger and toenails. If you have artificial nails, gel coating, etc. that needs to be removed by a nail salon please have this removed prior to surgery or surgery may need to be canceled/ delayed if the surgeon/ anesthesia feels like they are unable to be safely monitored.   Do not shave  48 hours prior to surgery.               Men may shave face and neck.   Do not bring valuables to the hospital. [facility] IS NOT             RESPONSIBLE   FOR VALUABLES.   Contacts, glasses, dentures or bridgework may not be worn into surgery.   Bring small overnight bag day of surgery.   DO NOT BRING YOUR HOME MEDICATIONS TO THE HOSPITAL. PHARMACY WILL DISPENSE MEDICATIONS LISTED ON YOUR MEDICATION LIST TO YOU DURING YOUR ADMISSION IN THE HOSPITAL!    Patients discharged on the day of surgery will not be allowed to drive home.  Someone NEEDS  to stay with you for the first 24 hours after anesthesia.   Special Instructions: Bring a copy of your healthcare power of attorney and living will documents the day of surgery if you haven't scanned them before.              Please read over the following fact sheets you were given: IF YOU HAVE QUESTIONS ABOUT YOUR PRE-OP INSTRUCTIONS PLEASE CALL [phone]   If you received a COVID test during your pre-op visit  it is requested that you wear a mask when out in public, stay away from anyone that may not be feeling well and notify your surgeon if you develop symptoms. If you test positive for Covid or have been in contact with anyone that has tested positive in the last 10 days please notify you surgeon.      Pre-operative 5 CHG Bath Instructions   You can play a key role in reducing the risk of infection after surgery. Your skin needs to be as free of germs as possible. You can reduce the number of germs on your skin by washing with CHG (chlorhexidine gluconate) soap before surgery. CHG is an antiseptic soap that kills germs and continues to kill germs even after washing.   DO NOT use if you have an allergy to chlorhexidine/CHG or antibacterial soaps. If your skin becomes reddened or irritated, stop using the CHG and notify one of our RNs at [phone].   Please shower  with the CHG soap starting 4 days before surgery using the following schedule:     Please keep in mind the following:  DO NOT shave, including legs and underarms, starting the day of your first shower.   You may shave your face at any point before/day of surgery.  Place clean sheets on your bed the day you start using CHG soap. Use a clean washcloth (not used since being washed) for each shower. DO NOT sleep with pets once you start using the CHG.   CHG Shower Instructions:  If you choose to wash your hair and private area, wash first with your normal shampoo/soap.  After you use shampoo/soap, rinse your hair and  body thoroughly to remove shampoo/soap residue.  Turn the water OFF and apply about 3 tablespoons (45 ml) of CHG soap to a CLEAN washcloth.  Apply CHG soap ONLY FROM YOUR NECK DOWN TO YOUR TOES (washing for 3-5 minutes)  DO NOT use CHG soap on face, private areas, open wounds, or sores.  Pay special attention to the area where your surgery is being performed.  If you are having back surgery, having someone wash your back for you may be helpful. Wait 2 minutes after CHG soap is applied, then you may rinse off the CHG soap.  Pat dry with a clean towel  Put on clean clothes/pajamas   If you choose to wear lotion, please use ONLY the CHG-compatible lotions on the back of this paper.     Additional instructions for the day of surgery: DO NOT APPLY any lotions, deodorants, cologne, or perfumes.   Put on clean/comfortable clothes.  Brush your teeth.  Ask your nurse before applying any prescription medications to the skin.      CHG Compatible Lotions   Aveeno Moisturizing lotion  Cetaphil Moisturizing Cream  Cetaphil Moisturizing Lotion  Clairol Herbal Essence Moisturizing Lotion, Dry Skin  Clairol Herbal Essence Moisturizing Lotion, Extra Dry Skin  Clairol Herbal Essence Moisturizing Lotion, Normal Skin  Curel Age Defying Therapeutic Moisturizing Lotion with Alpha Hydroxy  Curel Extreme Care Body Lotion  Curel Soothing Hands Moisturizing Hand Lotion  Curel Therapeutic Moisturizing Cream, Fragrance-Free  Curel Therapeutic Moisturizing Lotion, Fragrance-Free  Curel Therapeutic Moisturizing Lotion, Original Formula  Eucerin Daily Replenishing Lotion  Eucerin Dry Skin Therapy Plus Alpha Hydroxy Crme  Eucerin Dry Skin Therapy Plus Alpha Hydroxy Lotion  Eucerin Original Crme  Eucerin Original Lotion  Eucerin Plus Crme Eucerin Plus Lotion  Eucerin TriLipid Replenishing Lotion  Keri Anti-Bacterial Hand Lotion  Keri Deep Conditioning Original Lotion Dry Skin Formula Softly Scented   Keri Deep Conditioning Original Lotion, Fragrance Free Sensitive Skin Formula  Keri Lotion Fast Absorbing Fragrance Free Sensitive Skin Formula  Keri Lotion Fast Absorbing Softly Scented Dry Skin Formula  Keri Original Lotion  Keri Skin Renewal Lotion Keri Silky Smooth Lotion  Keri Silky Smooth Sensitive Skin Lotion  Nivea Body Creamy Conditioning Oil  Nivea Body Extra Enriched Lotion  Nivea Body Original Lotion  Nivea Body Sheer Moisturizing Lotion Nivea Crme  Nivea Skin Firming Lotion  NutraDerm 30 Skin Lotion  NutraDerm Skin Lotion  NutraDerm Therapeutic Skin Cream  NutraDerm Therapeutic Skin Lotion  ProShield Protective Hand Cream  Provon moisturizing lotion   Marie- Preparing for Total Shoulder Arthroplasty    Before surgery, you can play an important role. Because skin is not sterile, your skin needs to be as free of germs as possible. You can reduce the number of germs on your skin by using  the following products. Benzoyl Peroxide Gel Reduces the number of germs present on the skin Applied twice a day to shoulder area starting two days before surgery    ==================================================================  Please follow these instructions carefully:  BENZOYL PEROXIDE 5% GEL  Please do not use if you have an allergy to benzoyl peroxide.   If your skin becomes reddened/irritated stop using the benzoyl peroxide.  Starting two days before surgery, apply as follows: Apply benzoyl peroxide in the morning and at night. Apply after taking a shower. If you are not taking a shower clean entire shoulder front, back, and side along with the armpit with a clean wet washcloth.  Place a quarter-sized dollop on your shoulder and rub in thoroughly, making sure to cover the front, back, and side of your shoulder, along with the armpit.   2 days before ____ AM   ____ PM              1 day before ____ AM   ____ PM                         Do this twice a day for two  days.  (Last application is the night before surgery, AFTER using the CHG soap as described below).  Do NOT apply benzoyl peroxide gel on the day of surgery.

## 2023-06-17 ENCOUNTER — Encounter (HOSPITAL_COMMUNITY)
Admission: RE | Admit: 2023-06-17 | Discharge: 2023-06-17 | Disposition: A | Discharge status: Home / Self Care | Payer: Medicaid Other | Source: Ambulatory Visit | Attending: Internal Medicine | Admitting: Internal Medicine

## 2023-06-21 ENCOUNTER — Encounter (HOSPITAL_COMMUNITY): Payer: Self-pay

## 2023-06-21 ENCOUNTER — Encounter (HOSPITAL_COMMUNITY)
Admission: RE | Admit: 2023-06-21 | Discharge: 2023-06-21 | Disposition: A | Discharge status: Home / Self Care | Payer: Medicaid Other | Source: Ambulatory Visit | Attending: Orthopedic Surgery

## 2023-06-21 ENCOUNTER — Other Ambulatory Visit: Payer: Self-pay

## 2023-06-21 VITALS — BP 101/83 | HR 87 | Temp 98.0°F | Resp 16 | Ht 60.0 in | Wt 127.0 lb

## 2023-06-21 DIAGNOSIS — Z01812 Encounter for preprocedural laboratory examination: Secondary | ICD-10-CM | POA: Insufficient documentation

## 2023-06-21 DIAGNOSIS — Z01818 Encounter for other preprocedural examination: Secondary | ICD-10-CM

## 2023-06-21 LAB — CBC
HCT: 36.6 % (ref 36.0–46.0)
Hemoglobin: 12 g/dL (ref 12.0–15.0)
MCH: 30.5 pg (ref 26.0–34.0)
MCHC: 32.8 g/dL (ref 30.0–36.0)
MCV: 92.9 fL (ref 80.0–100.0)
Platelets: 384 10*3/uL (ref 150–400)
RBC: 3.94 MIL/uL (ref 3.87–5.11)
RDW: 15.9 % — ABNORMAL HIGH (ref 11.5–15.5)
WBC: 11.4 10*3/uL — ABNORMAL HIGH (ref 4.0–10.5)
nRBC: 0 % (ref 0.0–0.2)

## 2023-06-21 LAB — SURGICAL PCR SCREEN
MRSA, PCR: POSITIVE — AB
Staphylococcus aureus: POSITIVE — AB

## 2023-06-21 NOTE — Progress Notes (Addendum)
COVID Vaccine received:  []  No [x]  Yes Date of any COVID positive Test in last 90 days: no PCP - Dr. Burnell Blanks Cardiologist - no  Chest x-ray -  EKG -   Stress Test - no ECHO - no Cardiac Cath - no  Bowel Prep - [x]  No  []   Yes ______  Pacemaker / ICD device [x]  No []  Yes   Spinal Cord Stimulator:[x]  No []  Yes       History of Sleep Apnea? [x]  No []  Yes   CPAP used?- [x]  No []  Yes    Does the patient monitor blood sugar?          [x]  No []  Yes  []  N/A  Patient has: [x]  NO Hx DM   []  Pre-DM                 []  DM1  []   DM2 Does patient have a [employer] or Dexacom? []  No []  Yes   Fasting Blood Sugar Ranges-  Checks Blood Sugar _____ times a day  GLP1 agonist / usual dose - no GLP1 instructions:  SGLT-2 inhibitors / usual dose - no SGLT-2 instructions:   Blood Thinner / Instructions:no Aspirin Instructions:no  Comments:   Activity level: Patient is able / to climb a flight of stairs without difficulty; [x]  No CP  [x]  No SOB ___   Patient can perform ADLs without assistance.   Anesthesia review: COPD,HTN, Aortic atherosclerosis  Patient denies shortness of breath, fever, cough and chest pain at PAT appointment.  Patient verbalized understanding and agreement to the Pre-Surgical Instructions that were given to them at this PAT appointment. Patient was also educated of the need to review these PAT instructions again prior to his/her surgery.I reviewed the appropriate phone numbers to call if they have any and questions or concerns.

## 2023-06-21 NOTE — Progress Notes (Signed)
Please review preop PCR results from 06/21/23.

## 2023-06-21 NOTE — Patient Instructions (Addendum)
SURGICAL WAITING ROOM VISITATION Patients having surgery or a procedure may have no more than 2 support people in the waiting area - these visitors may rotate.    Children under the age of 30 must have an adult with them who is not the patient.  If the patient needs to stay at the hospital during part of their recovery, the visitor guidelines for inpatient rooms apply. Pre-op nurse will coordinate an appropriate time for 1 support person to accompany patient in pre-op.  This support person may not rotate.    Please refer to the [facility] website for the visitor guidelines for Inpatients (after your surgery is over and you are in a regular room).       Your procedure is scheduled on: 06/29/23   Report to [facility] Main Entrance    Report to admitting at 8 AM   Call this number if you have problems the morning of surgery [phone]   Do not eat food :After Midnight.   After Midnight you may have the following liquids until 7:30 AM DAY OF SURGERY  Water Non-Citrus Juices (without pulp, NO RED-Apple, White grape, White cranberry) Black Coffee (NO MILK/CREAM OR CREAMERS, sugar ok)  Clear Tea (NO MILK/CREAM OR CREAMERS, sugar ok) regular and decaf                             Plain Jell-O (NO RED)                                           Fruit ices (not with fruit pulp, NO RED)                                     Popsicles (NO RED)                                                               Sports drinks like Gatorade (NO RED)                   The day of surgery:  Drink ONE (1) Pre-Surgery Clear Ensure 7:30 AM the morning of surgery. Drink in one sitting. Do not sip.  This drink was given to you during your hospital  pre-op appointment visit. Nothing else to drink after completing the Pre-Surgery Clear Ensure           If you have questions, please contact your surgeon's office.   FOLLOW BOWEL PREP AND ANY ADDITIONAL PRE OP INSTRUCTIONS YOU RECEIVED FROM YOUR  SURGEON'S OFFICE!!!     Oral Hygiene is also important to reduce your risk of infection.                                    Remember - BRUSH YOUR TEETH THE MORNING OF SURGERY WITH YOUR REGULAR TOOTHPASTE   Do NOT smoke after Midnight   Take these medicines the morning of surgery with A SIP OF WATER Xanax if needed, Adderall, Gabapentin,  Synthroid, Lyrica, Inhaler:   Stop all vitamins and herbal supplements 7 days before surgery  DO NOT TAKE ANY ORAL DIABETIC MEDICATIONS DAY OF YOUR SURGERY  Bring CPAP mask and tubing day of surgery.                              You may not have any metal on your body including hair pins, jewelry, and body piercing             Do not wear make-up, lotions, powders, perfumes/cologne, or deodorant  Do not wear nail polish including gel and S&S, artificial/acrylic nails, or any other type of covering on natural nails including finger and toenails. If you have artificial nails, gel coating, etc. that needs to be removed by a nail salon please have this removed prior to surgery or surgery may need to be canceled/ delayed if the surgeon/ anesthesia feels like they are unable to be safely monitored.   Do not shave  48 hours prior to surgery.               Men may shave face and neck.   Do not bring valuables to the hospital. [facility] IS NOT RESPONSIBLE   FOR VALUABLES.   Contacts, dentures or bridgework may not be worn into surgery.   Bring small overnight bag day of surgery.   DO NOT BRING YOUR HOME MEDICATIONS TO THE HOSPITAL. PHARMACY WILL DISPENSE MEDICATIONS LISTED ON YOUR MEDICATION LIST TO YOU DURING YOUR ADMISSION IN THE HOSPITAL!    Patients discharged on the day of surgery will not be allowed to drive home.  Someone NEEDS to stay with you for the first 24 hours after anesthesia.   Special Instructions: Bring a copy of your healthcare power of attorney and living will documents the day of surgery if you haven't scanned them before.               Please read over the following fact sheets you were given: IF YOU HAVE QUESTIONS ABOUT YOUR PRE-OP INSTRUCTIONS PLEASE CALL [phone] Jamie Schultz  If you received a COVID test during your pre-op visit  it is requested that you wear a mask when out in public, stay away from anyone that may not be feeling well and notify your surgeon if you develop symptoms. If you test positive for Covid or have been in contact with anyone that has tested positive in the last 10 days please notify you surgeon.  [facility] - Preparing for Surgery Before surgery, you can play an important role.  Because skin is not sterile, your skin needs to be as free of germs as possible.  You can reduce the number of germs on your skin by washing with CHG (chlorahexidine gluconate) soap before surgery.  CHG is an antiseptic cleaner which kills germs and bonds with the skin to continue killing germs even after washing. Please DO NOT use if you have an allergy to CHG or antibacterial soaps.  If your skin becomes reddened/irritated stop using the CHG and inform your nurse when you arrive at Short Stay. Do not shave (including legs and underarms) for at least 48 hours prior to the first CHG shower.  You may shave your face/neck.  Please follow these instructions carefully:  1.  Shower with CHG Soap the night before surgery and the  morning of surgery.  2.  If you choose to wash your hair, wash your hair first  as usual with your normal  shampoo.  3.  After you shampoo, rinse your hair and body thoroughly to remove the shampoo.                             4.  Use CHG as you would any other liquid soap.  You can apply chg directly to the skin and wash.  Gently with a scrungie or clean washcloth.  5.  Apply the CHG Soap to your body ONLY FROM THE NECK DOWN.   Do   not use on face/ open                           Wound or open sores. Avoid contact with eyes, ears mouth and   genitals (private parts).                       Wash face,   Genitals (private parts) with your normal soap.             6.  Wash thoroughly, paying special attention to the area where your    surgery  will be performed.  7.  Thoroughly rinse your body with warm water from the neck down.  8.  DO NOT shower/wash with your normal soap after using and rinsing off the CHG Soap.                9.  Pat yourself dry with a clean towel.            10.  Wear clean pajamas.            11.  Place clean sheets on your bed the night of your first shower and do not  sleep with pets. Day of Surgery : Do not apply any lotions/deodorants the morning of surgery.  Please wear clean clothes to the hospital/surgery center.  FAILURE TO FOLLOW THESE INSTRUCTIONS MAY RESULT IN THE CANCELLATION OF YOUR SURGERY  PATIENT SIGNATURE_________________________________  NURSE SIGNATURE__________________________________   Pre-operative 5 CHG Schultz Instructions   You can play a key role in reducing the risk of infection after surgery. Your skin needs to be as free of germs as possible. You can reduce the number of germs on your skin by washing with CHG (chlorhexidine gluconate) soap before surgery. CHG is an antiseptic soap that kills germs and continues to kill germs even after washing.   DO NOT use if you have an allergy to chlorhexidine/CHG or antibacterial soaps. If your skin becomes reddened or irritated, stop using the CHG and notify one of our RNs at [phone].   Please shower with the CHG soap starting 4 days before surgery using the following schedule:     Please keep in mind the following:  DO NOT shave, including legs and underarms, starting the day of your first shower.   You may shave your face at any point before/day of surgery.  Place clean sheets on your bed the day you start using CHG soap. Use a clean washcloth (not used since being washed) for each shower. DO NOT sleep with pets once you start using the CHG.   CHG Shower Instructions:  If you choose to  wash your hair and private area, wash first with your normal shampoo/soap.  After you use shampoo/soap, rinse your hair and body thoroughly to remove shampoo/soap residue.  Turn the water OFF and apply about  3 tablespoons (45 ml) of CHG soap to a CLEAN washcloth.  Apply CHG soap ONLY FROM YOUR NECK DOWN TO YOUR TOES (washing for 3-5 minutes)  DO NOT use CHG soap on face, private areas, open wounds, or sores.  Pay special attention to the area where your surgery is being performed.  If you are having back surgery, having someone wash your back for you may be helpful. Wait 2 minutes after CHG soap is applied, then you may rinse off the CHG soap.  Pat dry with a clean towel  Put on clean clothes/pajamas   If you choose to wear lotion, please use ONLY the CHG-compatible lotions on the back of this paper.     Additional instructions for the day of surgery: DO NOT APPLY any lotions, deodorants, cologne, or perfumes.   Put on clean/comfortable clothes.  Brush your teeth.  Ask your nurse before applying any prescription medications to the skin.      CHG Compatible Lotions   Aveeno Moisturizing lotion  Cetaphil Moisturizing Cream  Cetaphil Moisturizing Lotion  Clairol Herbal Essence Moisturizing Lotion, Dry Skin  Clairol Herbal Essence Moisturizing Lotion, Extra Dry Skin  Clairol Herbal Essence Moisturizing Lotion, Normal Skin  Curel Age Defying Therapeutic Moisturizing Lotion with Alpha Hydroxy  Curel Extreme Care Body Lotion  Curel Soothing Hands Moisturizing Hand Lotion  Curel Therapeutic Moisturizing Cream, Fragrance-Free  Curel Therapeutic Moisturizing Lotion, Fragrance-Free  Curel Therapeutic Moisturizing Lotion, Original Formula  Eucerin Daily Replenishing Lotion  Eucerin Dry Skin Therapy Plus Alpha Hydroxy Crme  Eucerin Dry Skin Therapy Plus Alpha Hydroxy Lotion  Eucerin Original Crme  Eucerin Original Lotion  Eucerin Plus Crme Eucerin Plus Lotion  Eucerin TriLipid  Replenishing Lotion  Keri Anti-Bacterial Hand Lotion  Keri Deep Conditioning Original Lotion Dry Skin Formula Softly Scented  Keri Deep Conditioning Original Lotion, Fragrance Free Sensitive Skin Formula  Keri Lotion Fast Absorbing Fragrance Free Sensitive Skin Formula  Keri Lotion Fast Absorbing Softly Scented Dry Skin Formula  Keri Original Lotion  Keri Skin Renewal Lotion Keri Silky Smooth Lotion  Keri Silky Smooth Sensitive Skin Lotion  Nivea Body Creamy Conditioning Oil  Nivea Body Extra Enriched Lotion  Nivea Body Original Lotion  Nivea Body Sheer Moisturizing Lotion Nivea Crme  Nivea Skin Firming Lotion  NutraDerm 30 Skin Lotion  NutraDerm Skin Lotion  NutraDerm Therapeutic Skin Cream  NutraDerm Therapeutic Skin Lotion  ProShield Protective Hand Cream  Provon moisturizing lotion [place]- Preparing for Total Shoulder Arthroplasty    Before surgery, you can play an important role. Because skin is not sterile, your skin needs to be as free of germs as possible. You can reduce the number of germs on your skin by using the following products. Benzoyl Peroxide Gel Reduces the number of germs present on the skin Applied twice a day to shoulder area starting two days before surgery    ==================================================================  Please follow these instructions carefully:  BENZOYL PEROXIDE 5% GEL  Please do not use if you have an allergy to benzoyl peroxide.   If your skin becomes reddened/irritated stop using the benzoyl peroxide.  Starting two days before surgery, apply as follows: Apply benzoyl peroxide in the morning and at night. Apply after taking a shower. If you are not taking a shower clean entire shoulder front, back, and side along with the armpit with a clean wet washcloth.  Place a quarter-sized dollop on your shoulder and rub in thoroughly, making sure to cover the front, back, and side of your  shoulder, along with the armpit.   2 days  before ____ AM   ____ PM              1 day before ____ AM   ____ PM                         Do this twice a day for two days.  (Last application is the night before surgery, AFTER using the CHG soap as described below).  Do NOT apply benzoyl peroxide gel on the day of surgery. ________________________________________________________________________

## 2023-06-23 ENCOUNTER — Encounter (HOSPITAL_COMMUNITY): Payer: Medicaid Other
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
## 2023-06-29 ENCOUNTER — Observation Stay (HOSPITAL_COMMUNITY)
Admission: RE | Admit: 2023-06-29 | Discharge: 2023-06-30 | Disposition: A | Discharge status: Home / Self Care | Payer: Medicaid Other | Source: Ambulatory Visit | Attending: Orthopedic Surgery | Admitting: Orthopedic Surgery

## 2023-06-29 ENCOUNTER — Encounter (HOSPITAL_COMMUNITY)
Admission: RE | Disposition: A | Discharge status: Home / Self Care | Payer: Self-pay | Source: Ambulatory Visit | Attending: Orthopedic Surgery

## 2023-06-29 ENCOUNTER — Observation Stay (HOSPITAL_COMMUNITY): Payer: Medicaid Other

## 2023-06-29 ENCOUNTER — Other Ambulatory Visit: Payer: Self-pay

## 2023-06-29 ENCOUNTER — Encounter (HOSPITAL_COMMUNITY): Payer: Self-pay | Admitting: Orthopedic Surgery

## 2023-06-29 ENCOUNTER — Ambulatory Visit (HOSPITAL_COMMUNITY): Payer: Medicaid Other | Admitting: Certified Registered Nurse Anesthetist

## 2023-06-29 ENCOUNTER — Ambulatory Visit (HOSPITAL_COMMUNITY): Payer: Medicaid Other | Admitting: Physician Assistant

## 2023-06-29 DIAGNOSIS — Z96612 Presence of left artificial shoulder joint: Secondary | ICD-10-CM | POA: Diagnosis not present

## 2023-06-29 DIAGNOSIS — Z96611 Presence of right artificial shoulder joint: Principal | ICD-10-CM

## 2023-06-29 DIAGNOSIS — F17210 Nicotine dependence, cigarettes, uncomplicated: Secondary | ICD-10-CM | POA: Diagnosis not present

## 2023-06-29 DIAGNOSIS — Z79899 Other long term (current) drug therapy: Secondary | ICD-10-CM | POA: Diagnosis not present

## 2023-06-29 DIAGNOSIS — M1811 Unilateral primary osteoarthritis of first carpometacarpal joint, right hand: Secondary | ICD-10-CM | POA: Insufficient documentation

## 2023-06-29 DIAGNOSIS — M12811 Other specific arthropathies, not elsewhere classified, right shoulder: Principal | ICD-10-CM | POA: Insufficient documentation

## 2023-06-29 DIAGNOSIS — Z96653 Presence of artificial knee joint, bilateral: Secondary | ICD-10-CM | POA: Diagnosis not present

## 2023-06-29 DIAGNOSIS — J449 Chronic obstructive pulmonary disease, unspecified: Secondary | ICD-10-CM

## 2023-06-29 DIAGNOSIS — M19011 Primary osteoarthritis, right shoulder: Secondary | ICD-10-CM | POA: Diagnosis not present

## 2023-06-29 HISTORY — PX: REVERSE SHOULDER ARTHROPLASTY: SHX5054

## 2023-06-29 SURGERY — ARTHROPLASTY, SHOULDER, TOTAL, REVERSE
Anesthesia: Regional | Site: Shoulder | Laterality: Right

## 2023-06-29 MED ORDER — FENTANYL CITRATE PF 50 MCG/ML IJ SOSY: 25.0000 ug | PREFILLED_SYRINGE | INTRAMUSCULAR | Status: DC | PRN

## 2023-06-29 MED ORDER — ALBUTEROL SULFATE HFA 108 (90 BASE) MCG/ACT IN AERS
INHALATION_SPRAY | RESPIRATORY_TRACT | Status: DC | PRN
  Administered 2023-06-29: 5 via RESPIRATORY_TRACT

## 2023-06-29 MED ORDER — ALBUTEROL SULFATE HFA 108 (90 BASE) MCG/ACT IN AERS
INHALATION_SPRAY | RESPIRATORY_TRACT | Status: AC
  Filled 2023-06-29: qty 6.7

## 2023-06-29 MED ORDER — PHENYLEPHRINE HCL-NACL 20-0.9 MG/250ML-% IV SOLN
INTRAVENOUS | Status: DC | PRN
  Administered 2023-06-29: 35 ug/min via INTRAVENOUS

## 2023-06-29 MED ORDER — PROPOFOL 10 MG/ML IV BOLUS
INTRAVENOUS | Status: AC
  Filled 2023-06-29: qty 20

## 2023-06-29 MED ORDER — CHLORHEXIDINE GLUCONATE CLOTH 2 % EX PADS
6.0000 | MEDICATED_PAD | Freq: Every day | CUTANEOUS | Status: DC
  Administered 2023-06-30: 6 via TOPICAL

## 2023-06-29 MED ORDER — ACETAMINOPHEN 325 MG PO TABS: 325.0000 mg | ORAL_TABLET | Freq: Four times a day (QID) | ORAL | Status: DC | PRN

## 2023-06-29 MED ORDER — METHOCARBAMOL 500 MG PO TABS
500.0000 mg | ORAL_TABLET | Freq: Four times a day (QID) | ORAL | Status: DC | PRN
  Administered 2023-06-29 – 2023-06-30 (×3): 500 mg via ORAL
  Filled 2023-06-29 (×2): qty 1

## 2023-06-29 MED ORDER — FENTANYL CITRATE (PF) 100 MCG/2ML IJ SOLN
INTRAMUSCULAR | Status: DC | PRN
  Administered 2023-06-29: 25 ug via INTRAVENOUS
  Administered 2023-06-29: 50 ug via INTRAVENOUS
  Administered 2023-06-29: 25 ug via INTRAVENOUS

## 2023-06-29 MED ORDER — ROCURONIUM BROMIDE 10 MG/ML (PF) SYRINGE
PREFILLED_SYRINGE | INTRAVENOUS | Status: AC
  Filled 2023-06-29: qty 10

## 2023-06-29 MED ORDER — HYDROMORPHONE HCL 1 MG/ML IJ SOLN
0.5000 mg | INTRAMUSCULAR | Status: DC | PRN
Start: 2023-06-29 — End: 2023-06-30

## 2023-06-29 MED ORDER — MIDAZOLAM HCL 2 MG/2ML IJ SOLN
2.0000 mg | Freq: Once | INTRAMUSCULAR | Status: AC
  Administered 2023-06-29: 2 mg via INTRAVENOUS
  Filled 2023-06-29: qty 2

## 2023-06-29 MED ORDER — DEXAMETHASONE SODIUM PHOSPHATE 10 MG/ML IJ SOLN
INTRAMUSCULAR | Status: AC
  Filled 2023-06-29: qty 1

## 2023-06-29 MED ORDER — VANCOMYCIN HCL IN DEXTROSE 1-5 GM/200ML-% IV SOLN
1000.0000 mg | INTRAVENOUS | Status: AC
  Administered 2023-06-29: 1000 mg via INTRAVENOUS
  Filled 2023-06-29: qty 200

## 2023-06-29 MED ORDER — PROPOFOL 10 MG/ML IV BOLUS
INTRAVENOUS | Status: DC | PRN
  Administered 2023-06-29: 70 mg via INTRAVENOUS

## 2023-06-29 MED ORDER — ALBUTEROL SULFATE (2.5 MG/3ML) 0.083% IN NEBU
3.0000 mL | INHALATION_SOLUTION | Freq: Four times a day (QID) | RESPIRATORY_TRACT | Status: DC | PRN
  Administered 2023-06-29: 3 mL via RESPIRATORY_TRACT
  Filled 2023-06-29: qty 3

## 2023-06-29 MED ORDER — EPHEDRINE 5 MG/ML INJ
INTRAVENOUS | Status: AC
  Filled 2023-06-29: qty 5

## 2023-06-29 MED ORDER — QUETIAPINE FUMARATE 50 MG PO TABS
400.0000 mg | ORAL_TABLET | Freq: Every day | ORAL | Status: DC
  Administered 2023-06-29: 400 mg via ORAL
  Filled 2023-06-29: qty 8

## 2023-06-29 MED ORDER — LIDOCAINE HCL (PF) 2 % IJ SOLN
INTRAMUSCULAR | Status: AC
  Filled 2023-06-29: qty 5

## 2023-06-29 MED ORDER — TRANEXAMIC ACID-NACL 1000-0.7 MG/100ML-% IV SOLN
1000.0000 mg | INTRAVENOUS | Status: AC
  Administered 2023-06-29: 1000 mg via INTRAVENOUS
  Filled 2023-06-29: qty 100

## 2023-06-29 MED ORDER — DEXAMETHASONE SODIUM PHOSPHATE 10 MG/ML IJ SOLN
INTRAMUSCULAR | Status: DC | PRN
  Administered 2023-06-29: 5 mg via INTRAVENOUS

## 2023-06-29 MED ORDER — ONDANSETRON HCL 4 MG/2ML IJ SOLN
INTRAMUSCULAR | Status: AC
  Filled 2023-06-29: qty 2

## 2023-06-29 MED ORDER — OXYCODONE HCL 5 MG PO TABS
ORAL_TABLET | ORAL | Status: AC
  Filled 2023-06-29: qty 2

## 2023-06-29 MED ORDER — EPHEDRINE SULFATE-NACL 50-0.9 MG/10ML-% IV SOSY
PREFILLED_SYRINGE | INTRAVENOUS | Status: DC | PRN
  Administered 2023-06-29: 10 mg via INTRAVENOUS

## 2023-06-29 MED ORDER — VANCOMYCIN HCL 1 G IV SOLR
INTRAVENOUS | Status: DC | PRN
  Administered 2023-06-29: 1 g via TOPICAL

## 2023-06-29 MED ORDER — MENTHOL 3 MG MT LOZG: 1.0000 | LOZENGE | OROMUCOSAL | Status: DC | PRN

## 2023-06-29 MED ORDER — ACETAMINOPHEN 500 MG PO TABS
ORAL_TABLET | ORAL | Status: AC
  Filled 2023-06-29: qty 2

## 2023-06-29 MED ORDER — VANCOMYCIN HCL IN DEXTROSE 1-5 GM/200ML-% IV SOLN
1000.0000 mg | Freq: Two times a day (BID) | INTRAVENOUS | Status: AC
  Administered 2023-06-29: 1000 mg via INTRAVENOUS
  Filled 2023-06-29: qty 200

## 2023-06-29 MED ORDER — BUPIVACAINE HCL (PF) 0.5 % IJ SOLN
INTRAMUSCULAR | Status: DC | PRN
  Administered 2023-06-29: 15 mL via PERINEURAL

## 2023-06-29 MED ORDER — ONDANSETRON HCL 4 MG/2ML IJ SOLN
INTRAMUSCULAR | Status: DC | PRN
  Administered 2023-06-29: 4 mg via INTRAVENOUS

## 2023-06-29 MED ORDER — LIDOCAINE 2% (20 MG/ML) 5 ML SYRINGE
INTRAMUSCULAR | Status: DC | PRN
  Administered 2023-06-29: 20 mg via INTRAVENOUS

## 2023-06-29 MED ORDER — POLYETHYLENE GLYCOL 3350 17 G PO PACK: 17.0000 g | PACK | Freq: Every day | ORAL | Status: DC | PRN

## 2023-06-29 MED ORDER — OXYCODONE HCL 5 MG PO TABS
10.0000 mg | ORAL_TABLET | ORAL | Status: DC | PRN
  Administered 2023-06-29 – 2023-06-30 (×2): 15 mg via ORAL
  Filled 2023-06-29 (×2): qty 3

## 2023-06-29 MED ORDER — BUPIVACAINE LIPOSOME 1.3 % IJ SUSP
INTRAMUSCULAR | Status: DC | PRN
  Administered 2023-06-29: 10 mL via PERINEURAL

## 2023-06-29 MED ORDER — PHENOL 1.4 % MT LIQD: 1.0000 | OROMUCOSAL | Status: DC | PRN

## 2023-06-29 MED ORDER — BISACODYL 10 MG RE SUPP: 10.0000 mg | Freq: Every day | RECTAL | Status: DC | PRN

## 2023-06-29 MED ORDER — OXYCODONE HCL 5 MG PO TABS
5.0000 mg | ORAL_TABLET | ORAL | Status: DC | PRN
  Administered 2023-06-29 – 2023-06-30 (×2): 10 mg via ORAL
  Filled 2023-06-29: qty 2

## 2023-06-29 MED ORDER — VANCOMYCIN HCL 1000 MG IV SOLR
INTRAVENOUS | Status: AC
  Filled 2023-06-29: qty 20

## 2023-06-29 MED ORDER — ACETAMINOPHEN 500 MG PO TABS
1000.0000 mg | ORAL_TABLET | Freq: Once | ORAL | Status: AC
  Administered 2023-06-29: 1000 mg via ORAL
  Filled 2023-06-29: qty 2

## 2023-06-29 MED ORDER — PHENYLEPHRINE 80 MCG/ML (10ML) SYRINGE FOR IV PUSH (FOR BLOOD PRESSURE SUPPORT)
PREFILLED_SYRINGE | INTRAVENOUS | Status: AC
  Filled 2023-06-29: qty 10

## 2023-06-29 MED ORDER — GABAPENTIN 100 MG PO CAPS
100.0000 mg | ORAL_CAPSULE | Freq: Every day | ORAL | Status: DC
  Administered 2023-06-29: 100 mg via ORAL
  Filled 2023-06-29: qty 1

## 2023-06-29 MED ORDER — LACTATED RINGERS IV SOLN: INTRAVENOUS | Status: DC

## 2023-06-29 MED ORDER — STERILE WATER FOR IRRIGATION IR SOLN
Status: DC | PRN
  Administered 2023-06-29: 1000 mL

## 2023-06-29 MED ORDER — ACETAMINOPHEN 500 MG PO TABS
1000.0000 mg | ORAL_TABLET | Freq: Four times a day (QID) | ORAL | Status: DC
  Administered 2023-06-29 – 2023-06-30 (×3): 1000 mg via ORAL
  Filled 2023-06-29 (×2): qty 2

## 2023-06-29 MED ORDER — DOCUSATE SODIUM 100 MG PO CAPS
100.0000 mg | ORAL_CAPSULE | Freq: Two times a day (BID) | ORAL | Status: DC
  Administered 2023-06-29 – 2023-06-30 (×2): 100 mg via ORAL
  Filled 2023-06-29 (×2): qty 1

## 2023-06-29 MED ORDER — METHOCARBAMOL 500 MG PO TABS
ORAL_TABLET | ORAL | Status: AC
  Filled 2023-06-29: qty 1

## 2023-06-29 MED ORDER — PHENYLEPHRINE 80 MCG/ML (10ML) SYRINGE FOR IV PUSH (FOR BLOOD PRESSURE SUPPORT)
PREFILLED_SYRINGE | INTRAVENOUS | Status: DC | PRN
  Administered 2023-06-29: 80 ug via INTRAVENOUS

## 2023-06-29 MED ORDER — ONDANSETRON HCL 4 MG/2ML IJ SOLN
4.0000 mg | Freq: Four times a day (QID) | INTRAMUSCULAR | Status: DC | PRN
  Administered 2023-06-29: 4 mg via INTRAVENOUS
  Filled 2023-06-29: qty 2

## 2023-06-29 MED ORDER — CHLORHEXIDINE GLUCONATE 0.12 % MT SOLN
15.0000 mL | Freq: Once | OROMUCOSAL | Status: AC
  Administered 2023-06-29: 15 mL via OROMUCOSAL

## 2023-06-29 MED ORDER — AMPHETAMINE-DEXTROAMPHETAMINE 10 MG PO TABS
30.0000 mg | ORAL_TABLET | Freq: Two times a day (BID) | ORAL | Status: DC
  Administered 2023-06-29 – 2023-06-30 (×2): 30 mg via ORAL
  Filled 2023-06-29 (×2): qty 3

## 2023-06-29 MED ORDER — ALUM & MAG HYDROXIDE-SIMETH 200-200-20 MG/5ML PO SUSP
30.0000 mL | ORAL | Status: DC | PRN
  Administered 2023-06-29 – 2023-06-30 (×2): 30 mL via ORAL
  Filled 2023-06-29 (×2): qty 30

## 2023-06-29 MED ORDER — MAGNESIUM CITRATE PO SOLN: 1.0000 | Freq: Once | ORAL | Status: DC | PRN

## 2023-06-29 MED ORDER — ROCURONIUM BROMIDE 10 MG/ML (PF) SYRINGE
PREFILLED_SYRINGE | INTRAVENOUS | Status: DC | PRN
  Administered 2023-06-29: 50 mg via INTRAVENOUS
  Administered 2023-06-29: 10 mg via INTRAVENOUS

## 2023-06-29 MED ORDER — CHLORHEXIDINE GLUCONATE CLOTH 2 % EX PADS: 6.0000 | MEDICATED_PAD | Freq: Every day | CUTANEOUS | Status: DC

## 2023-06-29 MED ORDER — MUPIROCIN 2 % EX OINT
1.0000 | TOPICAL_OINTMENT | Freq: Two times a day (BID) | CUTANEOUS | Status: DC
  Administered 2023-06-29 – 2023-06-30 (×2): 1 via NASAL
  Filled 2023-06-29: qty 22

## 2023-06-29 MED ORDER — 0.9 % SODIUM CHLORIDE (POUR BTL) OPTIME
TOPICAL | Status: DC | PRN
  Administered 2023-06-29: 1000 mL

## 2023-06-29 MED ORDER — LEVOFLOXACIN IN D5W 500 MG/100ML IV SOLN
500.0000 mg | INTRAVENOUS | Status: AC
  Administered 2023-06-29: 500 mg via INTRAVENOUS
  Filled 2023-06-29: qty 100

## 2023-06-29 MED ORDER — FENTANYL CITRATE (PF) 100 MCG/2ML IJ SOLN
INTRAMUSCULAR | Status: AC
  Filled 2023-06-29: qty 2

## 2023-06-29 MED ORDER — LACTATED RINGERS IV SOLN
INTRAVENOUS | Status: DC
Start: 2023-06-29 — End: 2023-06-30

## 2023-06-29 MED ORDER — LEVOTHYROXINE SODIUM 88 MCG PO TABS
88.0000 ug | ORAL_TABLET | Freq: Every day | ORAL | Status: DC
  Administered 2023-06-30: 88 ug via ORAL
  Filled 2023-06-29: qty 1

## 2023-06-29 MED ORDER — PROMETHAZINE HCL 25 MG PO TABS: 25.0000 mg | ORAL_TABLET | Freq: Four times a day (QID) | ORAL | Status: DC | PRN

## 2023-06-29 MED ORDER — DIPHENHYDRAMINE HCL 12.5 MG/5ML PO ELIX
12.5000 mg | ORAL_SOLUTION | ORAL | Status: DC | PRN
Start: 2023-06-29 — End: 2023-06-30

## 2023-06-29 MED ORDER — POVIDONE-IODINE 10 % EX SWAB: 2.0000 | Freq: Once | CUTANEOUS | Status: DC

## 2023-06-29 MED ORDER — ACETAMINOPHEN 500 MG PO TABS: 1000.0000 mg | ORAL_TABLET | Freq: Once | ORAL | Status: DC

## 2023-06-29 MED ORDER — TRANEXAMIC ACID-NACL 1000-0.7 MG/100ML-% IV SOLN
1000.0000 mg | Freq: Once | INTRAVENOUS | Status: AC
  Administered 2023-06-29: 1000 mg via INTRAVENOUS
  Filled 2023-06-29: qty 100

## 2023-06-29 MED ORDER — ALPRAZOLAM 0.5 MG PO TABS
1.0000 mg | ORAL_TABLET | Freq: Four times a day (QID) | ORAL | Status: DC | PRN
  Administered 2023-06-29 – 2023-06-30 (×2): 1 mg via ORAL
  Filled 2023-06-29 (×2): qty 2

## 2023-06-29 MED ORDER — FENTANYL CITRATE PF 50 MCG/ML IJ SOSY
100.0000 ug | PREFILLED_SYRINGE | Freq: Once | INTRAMUSCULAR | Status: DC
Start: 2023-06-29 — End: 2023-06-29
  Filled 2023-06-29: qty 2

## 2023-06-29 MED ORDER — METHOCARBAMOL 1000 MG/10ML IJ SOLN: 500.0000 mg | Freq: Four times a day (QID) | INTRAMUSCULAR | Status: DC | PRN

## 2023-06-29 MED ORDER — ONDANSETRON HCL 4 MG PO TABS: 4.0000 mg | ORAL_TABLET | Freq: Four times a day (QID) | ORAL | Status: DC | PRN

## 2023-06-29 MED ORDER — PREGABALIN 50 MG PO CAPS
50.0000 mg | ORAL_CAPSULE | Freq: Three times a day (TID) | ORAL | Status: DC
  Administered 2023-06-29 – 2023-06-30 (×3): 50 mg via ORAL
  Filled 2023-06-29 (×3): qty 1

## 2023-06-29 SURGICAL SUPPLY — 59 items
AID PSTN UNV HD RSTRNT DISP (MISCELLANEOUS) ×1
BAG COUNTER SPONGE SURGICOUNT (BAG) IMPLANT
BAG SPEC THK2 15X12 ZIP CLS (MISCELLANEOUS) ×1
BAG SPNG CNTER NS LX DISP (BAG)
BAG ZIPLOCK 12X15 (MISCELLANEOUS) ×1 IMPLANT
BASEPLATE GLENOSPHERE 25 (Plate) IMPLANT
BEARING HUMERAL SHLDER 36M STD (Shoulder) IMPLANT
BIT DRILL TWIST 2.7 (BIT) IMPLANT
BLADE SAW SGTL 73X25 THK (BLADE) ×1 IMPLANT
BRNG HUM STD 36 RVRS SHLDR (Shoulder) ×1 IMPLANT
CEMENT BONE DEPUY (Cement) IMPLANT
CLSR STERI-STRIP ANTIMIC 1/2X4 (GAUZE/BANDAGES/DRESSINGS) ×1 IMPLANT
COOLER ICEMAN CLASSIC (MISCELLANEOUS) IMPLANT
COVER BACK TABLE 60X90IN (DRAPES) ×1 IMPLANT
COVER SURGICAL LIGHT HANDLE (MISCELLANEOUS) ×1 IMPLANT
DRAPE SHEET LG 3/4 BI-LAMINATE (DRAPES) ×2 IMPLANT
DRAPE SURG 17X11 SM STRL (DRAPES) ×1 IMPLANT
DRAPE SURG ORHT 6 SPLT 77X108 (DRAPES) ×2 IMPLANT
DRAPE TOP 10253 STERILE (DRAPES) ×1 IMPLANT
DRAPE U-SHAPE 47X51 STRL (DRAPES) ×1 IMPLANT
DRSG MEPILEX POST OP 4X8 (GAUZE/BANDAGES/DRESSINGS) ×1 IMPLANT
DURAPREP 26ML APPLICATOR (WOUND CARE) ×2 IMPLANT
ELECT REM PT RETURN 15FT ADLT (MISCELLANEOUS) ×1 IMPLANT
FACESHIELD WRAPAROUND (MASK) ×1 IMPLANT
FACESHIELD WRAPAROUND OR TEAM (MASK) ×1 IMPLANT
GLENOID SPHERE STD STRL 36MM (Orthopedic Implant) IMPLANT
GLOVE BIO SURGEON STRL SZ 6.5 (GLOVE) ×1 IMPLANT
GLOVE BIOGEL PI IND STRL 7.0 (GLOVE) ×1 IMPLANT
GLOVE BIOGEL PI IND STRL 8 (GLOVE) ×1 IMPLANT
GLOVE ORTHO TXT STRL SZ7.5 (GLOVE) ×1 IMPLANT
GOWN STRL SURGICAL XL XLNG (GOWN DISPOSABLE) ×2 IMPLANT
HOOD PEEL AWAY T7 (MISCELLANEOUS) ×2 IMPLANT
KIT BASIN OR (CUSTOM PROCEDURE TRAY) ×1 IMPLANT
KIT TURNOVER KIT A (KITS) IMPLANT
PACK SHOULDER (CUSTOM PROCEDURE TRAY) ×1 IMPLANT
PIN STEINMANN THREADED TIP (PIN) IMPLANT
PIN THREADED REVERSE (PIN) IMPLANT
RESTRAINT HEAD UNIVERSAL NS (MISCELLANEOUS) ×1 IMPLANT
SCREW BONE LOCKING 4.75X30X3.5 (Screw) IMPLANT
SCREW BONE STRL 6.5MMX25MM (Screw) IMPLANT
SCREW LOCKING STRL 4.75X25X3.5 (Screw) IMPLANT
SHOULDER HUMERAL BEAR 36M STD (Shoulder) ×1 IMPLANT
SLING ARM IMMOBILIZER LRG (SOFTGOODS) ×1 IMPLANT
SMARTMIX MINI TOWER (MISCELLANEOUS)
SPIKE FLUID TRANSFER (MISCELLANEOUS) IMPLANT
SPONGE T-LAP 4X18 ~~LOC~~+RFID (SPONGE) IMPLANT
STEM HUMERAL STRL 12MMX14MM (Stem) IMPLANT
SUCTION TUBE FRAZIER 12FR DISP (SUCTIONS) ×1 IMPLANT
SUPPORT WRAP ARM LG (MISCELLANEOUS) ×1 IMPLANT
SUT MAXBRAID (SUTURE) IMPLANT
SUT MAXBRAID #5 CCS-NDL 2PK (SUTURE) IMPLANT
SUT MON AB 3-0 SH 27 (SUTURE) ×1
SUT MON AB 3-0 SH27 (SUTURE) IMPLANT
SUT VIC AB 1 CT1 36 (SUTURE) ×1 IMPLANT
SUT VIC AB 2-0 CT1 27 (SUTURE) ×2
SUT VIC AB 2-0 CT1 TAPERPNT 27 (SUTURE) ×1 IMPLANT
TOWEL OR 17X26 10 PK STRL BLUE (TOWEL DISPOSABLE) ×1 IMPLANT
TOWER SMARTMIX MINI (MISCELLANEOUS) IMPLANT
TRAY HUM MINI SHOULDER +3 40 (Joint) IMPLANT

## 2023-06-29 NOTE — Transfer of Care (Signed)
Immediate Anesthesia Transfer of Care Note  Patient: Jamie Schultz  Procedure(s) Performed: RIGHT REVERSE SHOULDER ARTHROPLASTY (Right: Shoulder)  Patient Location: PACU  Anesthesia Type:General and Regional  Level of Consciousness: awake, alert , and oriented  Airway & Oxygen Therapy: Patient Spontanous Breathing and Patient connected to face mask oxygen  Post-op Assessment: Report given to RN and Post -op Vital signs reviewed and stable  Post vital signs: Reviewed and stable  Last Vitals:  Vitals Value Taken Time  BP 150/93 06/29/23 1226  Temp    Pulse 77 06/29/23 1227  Resp 20 06/29/23 1227  SpO2 94 % 06/29/23 1227  Vitals shown include unfiled device data.  Last Pain:  Vitals:   06/29/23 0945  PainSc: 6          Complications: No notable events documented.

## 2023-06-29 NOTE — Plan of Care (Signed)
 [facility] Tonsillectomy/Adenoidectomy, Postoperative PEDS care plan entered in error.

## 2023-06-29 NOTE — Interval H&P Note (Signed)
History and Physical Interval Note:  06/29/2023 9:23 AM  Jamie Schultz  has presented today for surgery, with the diagnosis of right shoulder OA.  The various methods of treatment have been discussed with the patient and family. After consideration of risks, benefits and other options for treatment, the patient has consented to  Procedure(s): REVERSE SHOULDER ARTHROPLASTY (Right) as a surgical intervention.  The patient's history has been reviewed, patient examined, no change in status, stable for surgery.  I have reviewed the patient's chart and labs.  Questions were answered to the patient's satisfaction.    She also has asked I manipulate her right thumb, because it is "out of socket", will examine her thumb under anesthesia as well.     Eulas Post

## 2023-06-29 NOTE — Anesthesia Procedure Notes (Signed)
Anesthesia Regional Block: Interscalene brachial plexus block   Pre-Anesthetic Checklist: , timeout performed,  Correct Patient, Correct Site, Correct Laterality,  Correct Procedure, Correct Position, site marked,  Risks and benefits discussed,  Pre-op evaluation,  At surgeon's request and post-op pain management  Laterality: Right  Prep: Maximum Sterile Barrier Precautions used, chloraprep       Needles:  Injection technique: Single-shot  Needle Type: Echogenic Stimulator Needle     Needle Length: 5cm  Needle Gauge: 21     Additional Needles:   Procedures:,,,, ultrasound used (permanent image in chart),,    Narrative:  Start time: 06/29/2023 9:38 AM End time: 06/29/2023 9:42 AM Injection made incrementally with aspirations every 5 mL. Anesthesiologist: Elmer Picker, MD

## 2023-06-29 NOTE — Op Note (Signed)
06/29/2023  11:57 AM  PATIENT:  Jamie Schultz    PRE-OPERATIVE DIAGNOSIS: Right shoulder rotator cuff arthropathy with right thumb metacarpophalangeal joint osteoarthritis  POST-OPERATIVE DIAGNOSIS:  Same  PROCEDURE: Right reverse Total Shoulder Arthroplasty with examination under anesthesia right thumb metacarpal phalangeal joint  SURGEON:  Eulas Post, MD  PHYSICIAN ASSISTANT: Janine Ores, PA-C, present and scrubbed throughout the case, critical for completion in a timely fashion, and for retraction, instrumentation, and closure.  ANESTHESIA:   General with interscalene block using Exparel  ESTIMATED BLOOD LOSS: 150 mL  UNIQUE ASPECTS OF THE CASE: She had advanced rotator cuff arthropathy with a fairly poor quality subscapularis, flattening of the biceps tendon with dislocation, and a high riding humeral head with very little infraspinatus still attached, and absolutely no supraspinatus present.  I did get the poor quality subscapularis tissue repaired back to bone however, although I am not sure how much it functions but it is a tissue layer.  The glenoid cartilage was all intact, I reamed down to just to the subchondral bone, I had excellent fixation both inferiorly and superiorly.  I did not feel I needed additional anterior and posterior screws, as the baseplate had 100% contact.  PREOPERATIVE INDICATIONS:  [name] is a  62 y.o. female with a diagnosis of right shoulder rotator cuff arthropathy who failed conservative measures and elected for surgical management.  She also had thumb metacarpal phalangeal joint osteoarthritis with subluxation and explicitly requested that I manipulate her thumb in order to "put it back in place".  I counseled her preoperatively that I did not think that a reduction would be possible, but that I would try.  The risks benefits and alternatives were discussed with the patient preoperatively including but not limited to the risks of infection,  bleeding, nerve injury, cardiopulmonary complications, the need for revision surgery, dislocation, brachial plexus palsy, incomplete relief of pain, among others, and the patient was willing to proceed.  OPERATIVE IMPLANTS:   Implant Name Type Inv. Item Serial No. Manufacturer Lot No. LRB No. Used Action  BASEPLATE GLENOSPHERE 25 - [id number] Plate BASEPLATE GLENOSPHERE 25  [employer](ORTH,TRAU,BIO,SG) [id number] Right 1 Implanted  SCREW BONE STRL 6.0QMV78IO - [id number] Screw SCREW BONE STRL 6.5MMX25MM  [employer](ORTH,TRAU,BIO,SG) [id number] Right 1 Implanted  SCREW LOCKING STRL 2.53G64Q0.5 - [id number] Screw SCREW LOCKING STRL 4.75X25X3.5  [employer](ORTH,TRAU,BIO,SG) [id number] Right 1 Implanted  SCREW BONE LOCKING 9.51O84Z6.5 - [id number] Screw SCREW BONE LOCKING 4.75X30X3.5  [employer](ORTH,TRAU,BIO,SG) [id number] Right 1 Implanted  GLENOID SPHERE STD STRL - [id number] Orthopedic Implant GLENOID SPHERE STD STRL  [employer](ORTH,TRAU,BIO,SG) [id number] Right 1 Implanted  STEM HUMERAL STRL 12MMX14MM - [id number] Stem STEM HUMERAL STRL 12MMX14MM  [employer]ON(ORTH,TRAU,BIO,SG) [id number] Right 1 Implanted  TRAY HUM MINI SHOULDER +3 40 - [id number] Joint TRAY HUM MINI SHOULDER +3 40  [employer](ORTH,TRAU,BIO,SG) [id number] Right 1 Implanted  BRNG HUM STD 36 RVRS SHLDR - [id number] Shoulder BRNG HUM STD 36 RVRS SHLDR  [employer](ORTH,TRAU,BIO,SG) [id number] Right 1 Implanted    OPERATIVE FINDINGS: The manipulation of the thumb was not very eventful.  There was really no ability to "put it back into place".  She did have advanced rotator cuff arthropathy with poor quality subscapularis tissue.  I had excellent 2 finger tension with reduction, initially I thought I was going to need to take more proximal bone, but ultimately I countersunk the trial slightly, and then calcar planed, and then had appropriate soft tissue tension with a +3 offset.  OPERATIVE PROCEDURE: The patient was brought  to the operating room and placed in the supine position. General anesthesia was administered.  I manipulated her thumb after timeout was performed, but did not achieve any substantial change in her metacarpophalangeal joint relationship.  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  Time out was performed again. The upper extremity was prepped and draped in usual sterile fashion. The patient was in a beachchair position. Deltopectoral approach was carried out. The biceps was tenodesed to the pectoralis tendon with #2 Maxbraid. The subscapularis was released off of the bone.   I then performed circumferential releases of the humerus, and then dislocated the head, and then reamed with the reamer to the above named size.  I then applied the jig, and cut the humeral head in 30 of retroversion, and then turned my attention to the glenoid.  Deep retractors were placed, and I resected the labrum, and then placed a guidepin into the center position on the glenoid, with slight inferior inclination. I then reamed over the guidepin, and this created a small metaphyseal cancellus blush inferiorly, removing just the cartilage to the subchondral bone superiorly. The base plate was selected and impacted place, and then I secured it centrally with a nonlocking screw, and I had excellent purchase both inferiorly and superiorly.   I then turned my attention to the glenosphere, and impacted this into place, placing slight inferior offset (set on B).   The glenosphere was completely seated, and had engagement of the [facility] taper. I then turned my attention back to the humerus.  I sequentially broached, and then trialed, and was found to restore soft tissue tension, and it had 2 finger tightness.  Initially it was too tight, but the stem was still a  little bit loose, so I countersunk the stem and then calcar planed, and then had appropriate soft tissue tension.  Therefore the above named components were selected. The shoulder felt stable throughout functional motion.  Before I placed the real prosthesis I had also placed a total of 1 #2 and 2 #5 Maxbraid through the humerus for later subscapularis repair.  I then impacted the real prosthesis into place, as well as the real humeral tray, and reduced the shoulder. The shoulder had excellent motion, and was stable, and I irrigated the wounds copiously.    I then used the Maxbraid suture to repair the subscapularis. This came down to bone.   I then irrigated the shoulder copiously once more, I placed vancomycin powder, repaired the deltopectoral interval with Vicryl followed by subcutaneous Vicryl with Steri-Strips and sterile gauze for the skin. The patient was awakened and returned back in stable and satisfactory condition. There were no complications and She tolerated the procedure well.

## 2023-06-29 NOTE — Discharge Instructions (Addendum)
POST-OPERATIVE INSTRUCTIONS - TOTAL SHOULDER REPLACEMENT    WOUND CARE You may leave the operative dressing in place until your follow-up appointment. KEEP THE INCISIONS CLEAN AND DRY. There may be a small amount of fluid/bleeding leaking at the surgical site. This is normal after surgery.  If it fills with liquid or blood please call us immediately to change it for you. Use the provided ice machine or Ice packs as often as possible for the first 3-4 days, then as needed for pain relief.   Keep a layer of cloth or a shirt between your skin and the cooling unit to prevent frost bite as it can get very cold.  SHOWERING: - You may shower on Post-Op Day #3.  - The dressing is water resistant but not water proof, please keep covered while showering - You may remove the sling for showering - Gently pat the area dry.  - Do not soak the shoulder in water.  - Do not go swimming in the pool or ocean until your incision has completely healed (about 6 weeks after surgery) - KEEP THE INCISIONS CLEAN AND DRY.  EXERCISES Wear the sling at all times  You may remove the sling for showering, but keep the arm across the chest or in a secondary sling.    Accidental/Purposeful External Rotation and shoulder flexion (reaching behind you) is to be avoided at all costs for the first month. It is ok to come out of your sling if your are sitting and have assistance for eating.   Do not lift anything heavier than 1 pound until we discuss it further in clinic. It is normal for your fingers/hand to become more swollen after surgery and discolored from bruising.   This will resolve over the first few weeks usually after surgery. Please continue to ambulate and do not stay sitting or lying for too long.  Perform foot and wrist pumps to assist in circulation.  REGIONAL ANESTHESIA (NERVE BLOCKS) The anesthesia team may have performed a nerve block for you this is a great tool used to minimize pain.   The block may  start wearing off overnight (between 8-24 hours postop) When the block wears off, your pain may go from nearly zero to the pain you would have had postop without the block. This is an abrupt transition but nothing dangerous is happening.   This can be a challenging period but utilize your as needed pain medications to try and manage this period. We suggest you use the pain medication the first night prior to going to bed, to ease this transition.  You may take an extra dose of narcotic when this happens if needed  FOLLOW-UP If you develop a Fever (>101.5), Redness or Drainage from the surgical incision site, please call our office to arrange for an evaluation. Please call the office to schedule a follow-up appointment for a wound check, 7-10 days post-operatively.  IF YOU HAVE ANY QUESTIONS, PLEASE FEEL FREE TO CALL OUR OFFICE.  HELPFUL INFORMATION  Your arm will be in a sling following surgery. You will be in this sling for the next 6 weeks.   You may be more comfortable sleeping in a semi-seated position the first few nights following surgery.  Keep a pillow propped under the elbow and forearm for comfort.  If you have a recliner type of chair it might be beneficial.  If not that is fine too, but it would be helpful to sleep propped up with pillows behind your operated shoulder  as well under your elbow and forearm.  This will reduce pulling on the suture lines.  When dressing, put your operative arm in the sleeve first.  When getting undressed, take your operative arm out last.  Loose fitting, button-down shirts are recommended.  In most states it is against the law to drive while your arm is in a sling. And certainly against the law to drive while taking narcotics.  You may return to work/school in the next couple of days when you feel up to it. Desk work and typing in the sling is fine.  We suggest you use the pain medication the first night prior to going to bed, in order to ease any  pain when the anesthesia wears off. You should avoid taking pain medications on an empty stomach as it will make you nauseous.  You should wean off your narcotic medicines as soon as you are able.     Most patients will be off narcotics before their first postop appointment.   Do not drink alcoholic beverages or take illicit drugs when taking pain medications.  Pain medication may make you constipated.  Below are a few solutions to try in this order: Decrease the amount of pain medication if you aren't having pain. Drink lots of decaffeinated fluids. Drink prune juice and/or each dried prunes  If the first 3 don't work start with additional solutions Take Colace - an over-the-counter stool softener Take Senokot - an over-the-counter laxative Take Miralax - a stronger over-the-counter laxative   Dental Antibiotics:  In most cases prophylactic antibiotics for Dental procdeures after total joint surgery are not necessary.  Exceptions are as follows:  1. History of prior total joint infection  2. Severely immunocompromised (Organ Transplant, cancer chemotherapy, Rheumatoid biologic meds such as Humera)  3. Poorly controlled diabetes (A1C &gt; 8.0, blood glucose over 200)  If you have one of these conditions, contact your surgeon for an antibiotic prescription, prior to your dental procedure.

## 2023-06-29 NOTE — Anesthesia Procedure Notes (Signed)
Procedure Name: Intubation Date/Time: 06/29/2023 10:07 AM  Performed by: Orest Dikes, CRNAPre-anesthesia Checklist: Patient identified, Emergency Drugs available, Suction available and Patient being monitored Patient Re-evaluated:Patient Re-evaluated prior to induction Oxygen Delivery Method: Circle system utilized Preoxygenation: Pre-oxygenation with 100% oxygen Induction Type: IV induction Ventilation: Mask ventilation without difficulty Laryngoscope Size: Mac and 4 Grade View: Grade I Tube type: Oral Tube size: 7.0 mm Number of attempts: 1 Airway Equipment and Method: Stylet Placement Confirmation: ETT inserted through vocal cords under direct vision, positive ETCO2 and breath sounds checked- equal and bilateral Secured at: 21 cm Tube secured with: Tape Dental Injury: Teeth and Oropharynx as per pre-operative assessment

## 2023-06-29 NOTE — Anesthesia Preprocedure Evaluation (Addendum)
Anesthesia Evaluation  Patient identified by MRN, date of birth, ID band Patient awake    Reviewed: Allergy & Precautions, NPO status , Patient's Chart, lab work & pertinent test results  Airway Mallampati: II  TM Distance: >3 FB Neck ROM: Full    Dental  (+) Edentulous Upper, Edentulous Lower, Dental Advisory Given   Pulmonary COPD,  COPD inhaler, Current Smoker and Patient abstained from smoking.   Pulmonary exam normal breath sounds clear to auscultation       Cardiovascular hypertension, Normal cardiovascular exam Rhythm:Regular Rate:Normal     Neuro/Psych  PSYCHIATRIC DISORDERS Anxiety Depression    negative neurological ROS     GI/Hepatic ,GERD  ,,(+) Hepatitis -, C  Endo/Other  Hypothyroidism    Renal/GU negative Renal ROS  negative genitourinary   Musculoskeletal  (+) Arthritis ,  Fibromyalgia -Chronic pain   Abdominal   Peds  (+) ATTENTION DEFICIT DISORDER WITHOUT HYPERACTIVITY Hematology negative hematology ROS (+)   Anesthesia Other Findings   Reproductive/Obstetrics                             Anesthesia Physical Anesthesia Plan  ASA: 3  Anesthesia Plan: General and Regional   Post-op Pain Management: Regional block* and Tylenol PO (pre-op)*   Induction: Intravenous  PONV Risk Score and Plan: 2 and Midazolam, Dexamethasone and Ondansetron  Airway Management Planned: Oral ETT  Additional Equipment:   Intra-op Plan:   Post-operative Plan: Extubation in OR  Informed Consent: I have reviewed the patients History and Physical, chart, labs and discussed the procedure including the risks, benefits and alternatives for the proposed anesthesia with the patient or authorized representative who has indicated his/her understanding and acceptance.     Dental advisory given  Plan Discussed with: CRNA  Anesthesia Plan Comments:        Anesthesia Quick Evaluation

## 2023-06-29 NOTE — Anesthesia Postprocedure Evaluation (Signed)
Anesthesia Post Note  Patient: Jamie Schultz  Procedure(s) Performed: RIGHT REVERSE SHOULDER ARTHROPLASTY (Right: Shoulder)     Patient location during evaluation: PACU Anesthesia Type: Regional and General Level of consciousness: awake and alert Pain management: pain level controlled Vital Signs Assessment: post-procedure vital signs reviewed and stable Respiratory status: spontaneous breathing, nonlabored ventilation, respiratory function stable and patient connected to nasal cannula oxygen Cardiovascular status: blood pressure returned to baseline and stable Postop Assessment: no apparent nausea or vomiting Anesthetic complications: no  No notable events documented.  Last Vitals:  Vitals:   06/29/23 1245 06/29/23 1300  BP: (!) 149/76 (!) 143/95  Pulse: 79 74  Resp: 20 18  Temp:  36.6 C  SpO2: 92% 93%    Last Pain:  Vitals:   06/29/23 1300  PainSc: 0-No pain                 [employer] L Kandee Escalante

## 2023-06-30 ENCOUNTER — Encounter (HOSPITAL_COMMUNITY): Payer: Self-pay | Admitting: Orthopedic Surgery

## 2023-06-30 DIAGNOSIS — M12811 Other specific arthropathies, not elsewhere classified, right shoulder: Secondary | ICD-10-CM | POA: Diagnosis not present

## 2023-06-30 LAB — BASIC METABOLIC PANEL
Anion gap: 9 (ref 5–15)
BUN: 25 mg/dL — ABNORMAL HIGH (ref 8–23)
CO2: 24 mmol/L (ref 22–32)
Calcium: 9.7 mg/dL (ref 8.9–10.3)
Chloride: 103 mmol/L (ref 98–111)
Creatinine, Ser: 0.85 mg/dL (ref 0.44–1.00)
GFR, Estimated: >60 mL/min (ref >60)
Glucose, Bld: 87 mg/dL (ref 70–99)
Potassium: 4 mmol/L (ref 3.5–5.1)
Sodium: 136 mmol/L (ref 135–145)

## 2023-06-30 MED ORDER — TIZANIDINE HCL 4 MG PO TABS: 4.0000 mg | ORAL_TABLET | Freq: Three times a day (TID) | ORAL | 0 refills | Status: DC | PRN

## 2023-06-30 MED ORDER — SENNA-DOCUSATE SODIUM 8.6-50 MG PO TABS: 2.0000 | ORAL_TABLET | Freq: Every day | ORAL | 1 refills | Status: DC

## 2023-06-30 MED ORDER — ONDANSETRON HCL 4 MG PO TABS: 4.0000 mg | ORAL_TABLET | Freq: Three times a day (TID) | ORAL | 0 refills | Status: DC | PRN

## 2023-06-30 MED ORDER — OXYCODONE HCL 5 MG PO TABS: 5.0000 mg | ORAL_TABLET | ORAL | 0 refills | Status: DC | PRN

## 2023-06-30 NOTE — Evaluation (Signed)
Occupational Therapy Evaluation and Discharge Summary Patient Details Name: Jamie Schultz MRN: [id number] DOB: November 01, 1960 Today's Date: 06/30/2023   History of Present Illness PT is a 62 yo female admitted for a R reverse total shoulder suergery.  PMH: COPD, arthritis, fibromyalgia, B shoulder pain, B knee replacements, back surgery   Clinical Impression   Pt admitted with the above diagnosis and has the deficits outlined below. All education completed with pt and husband regarding sling wearing, dressing, bathing, toileting, exercises  and precautions.  Pt is very distracted and requires moderate reminders to keep R shoulder immobile when it is out of sling for exercises and for bathing/dressing. Spoke at length with pt and husband about the need to follow the precautions the MD has set.  Pt's husband will be with pt at all times and both understand the shoulder protocol and handouts given for reinforcement. No further acute OT needs at this time.       If plan is discharge home, recommend the following: A little help with bathing/dressing/bathroom;Assistance with cooking/housework;Assist for transportation    Functional Status Assessment  Patient has had a recent decline in their functional status and demonstrates the ability to make significant improvements in function in a reasonable and predictable amount of time.  Equipment Recommendations  None recommended by OT    Recommendations for Other Services       Precautions / Restrictions Precautions Precautions: Shoulder Type of Shoulder Precautions: no PROM/AROM of shoulder. sling all times, NWB Shoulder Interventions: Shoulder sling/immobilizer;Off for dressing/bathing/exercises Precaution Booklet Issued: Yes (comment) Required Braces or Orthoses: Sling Restrictions Weight Bearing Restrictions: Yes RUE Weight Bearing: Non weight bearing      Mobility Bed Mobility Overal bed mobility: Modified Independent                   Transfers Overall transfer level: Modified independent Equipment used: None               General transfer comment: no assist needed      Balance Overall balance assessment: Modified Independent                                         ADL either performed or assessed with clinical judgement   ADL Overall ADL's : Needs assistance/impaired Eating/Feeding: Set up;Sitting   Grooming: Minimal assistance;Sitting   Upper Body Bathing: Minimal assistance;Sitting   Lower Body Bathing: Supervison/ safety;Sit to/from stand;Cueing for compensatory techniques   Upper Body Dressing : Minimal assistance;Sitting Upper Body Dressing Details (indicate cue type and reason): instructed on sling donning and doffing as well as techinque to dress UE. Lower Body Dressing: Supervision/safety;Sit to/from stand;Cueing for compensatory techniques   Toilet Transfer: Supervision/safety;Ambulation;Comfort height toilet;Grab bars   Toileting- Clothing Manipulation and Hygiene: Supervision/safety;Sit to/from stand   Tub/ Shower Transfer: Supervision/safety;Tub transfer   Functional mobility during ADLs: Supervision/safety General ADL Comments: Pt requires overall min assist to supervision with adls due to sling and immobiization of RUE.     Vision Baseline Vision/History: 1 Wears glasses Ability to See in Adequate Light: 0 Adequate Patient Visual Report: No change from baseline Vision Assessment?: No apparent visual deficits     Perception Perception: Within Functional Limits       Praxis Praxis: WFL       Pertinent Vitals/Pain Pain Assessment Pain Assessment: Faces Faces Pain Scale: Hurts even more Pain Location: R shoulder with  movement Pain Descriptors / Indicators: Aching, Sore Pain Intervention(s): Limited activity within patient's tolerance, Monitored during session, Premedicated before session, Repositioned     Extremity/Trunk Assessment Upper Extremity  Assessment Upper Extremity Assessment: RUE deficits/detail RUE Deficits / Details: Elbow/wrist/hand WFL.  Pain in R thumb due to manipulation done during surgery to assist with arthritis.  Shoulder: no PROM or AROM at this time RUE: Unable to fully assess due to pain;Unable to fully assess due to immobilization RUE Sensation: WNL RUE Coordination: decreased gross motor   Lower Extremity Assessment Lower Extremity Assessment: Overall WFL for tasks assessed   Cervical / Trunk Assessment Cervical / Trunk Assessment: Back Surgery   Communication Communication Communication: No apparent difficulties   Cognition Arousal: Alert Behavior During Therapy: Anxious Overall Cognitive Status: Within Functional Limits for tasks assessed                                 General Comments: Pt very easily distracted. Pt wtih ADD and requires redirection with all tasks at baseline.     General Comments  assist only needed for adl tasks that would require RUE.  Husband there to assist as needed.  Pt requires moderate cues to not actively use arm when sling is off for bathing/dressing.    Exercises Exercises: Shoulder Shoulder Exercises Elbow Flexion: AROM, Right, 5 reps Elbow Extension: AROM, Right, 5 reps Wrist Flexion: AROM, Right, 5 reps Wrist Extension: AROM, Right, 5 reps Digit Composite Flexion: AROM, Right, 5 reps Composite Extension: AROM, Right, 5 reps Neck Flexion: AROM, 5 reps Neck Extension: AROM, 5 reps Neck Lateral Flexion - Right: AROM, 5 reps Neck Lateral Flexion - Left: AROM, 5 reps   Shoulder Instructions Shoulder Instructions Donning/doffing shirt without moving shoulder: Minimal assistance Method for sponge bathing under operated UE: Supervision/safety Donning/doffing sling/immobilizer: Minimal assistance Correct positioning of sling/immobilizer: Supervision/safety ROM for elbow, wrist and digits of operated UE: Modified independent Sling wearing schedule (on  at all times/off for ADL's): Supervision/safety Proper positioning of operated UE when showering: Supervision/safety Positioning of UE while sleeping: Supervision/safety    Home Living Family/patient expects to be discharged to:: Private residence Living Arrangements: Spouse/significant other Available Help at Discharge: Family;Available 24 hours/day Type of Home: House Home Access: Stairs to enter [employer] of Steps: 3 Entrance Stairs-Rails: None Home Layout: One level     Bathroom Shower/Tub: IT trainer: Standard     Home Equipment: Other (comment);Shower seat (ice machine for shoulder)          Prior Functioning/Environment Prior Level of Function : Independent/Modified Independent             Mobility Comments: independent ADLs Comments: previously independent, cooking, cleaning, driving        OT Problem List: Decreased strength;Decreased range of motion      OT Treatment/Interventions:      OT Goals(Current goals can be found in the care plan section) Acute Rehab OT Goals Patient Stated Goal: to go home soon OT Goal Formulation: All assessment and education complete, DC therapy  OT Frequency:      Co-evaluation              AM-PAC OT "6 Clicks" Daily Activity     Outcome Measure Help from another person eating meals?: A Little Help from another person taking care of personal grooming?: A Little Help from another person toileting, which includes using toliet, bedpan, or urinal?:  A Little Help from another person bathing (including washing, rinsing, drying)?: A Little Help from another person to put on and taking off regular upper body clothing?: A Little Help from another person to put on and taking off regular lower body clothing?: A Little 6 Click Score: 18   End of Session Nurse Communication: Mobility status  Activity Tolerance: Patient tolerated treatment well Patient left: in bed;with call  bell/phone within reach;with family/visitor present  OT Visit Diagnosis: Muscle weakness (generalized) (M62.81)                Time: 1610-9604 OT Time Calculation (min): 28 min Charges:  OT General Charges $OT Visit: 1 Visit OT Evaluation $OT Eval Low Complexity: 1 Low OT Treatments $Self Care/Home Management : 8-22 mins  Hope Budds 06/30/2023, 8:40 AM

## 2023-06-30 NOTE — Progress Notes (Signed)
Sling/immobilizer reapplied as patient had it partially removed, stressed importance of keeping it on and maintaining NWB status to right upper extremity.  Patient anxious for discharge home today.

## 2023-06-30 NOTE — Discharge Summary (Signed)
Discharge Summary  Patient ID: Jamie Schultz MRN: [id number] DOB/AGE: May 11, 1961 62 y.o.  Admit date: 06/29/2023 Discharge date: 06/30/2023  Admission Diagnoses:  S/P reverse total shoulder arthroplasty, right  Discharge Diagnoses:  Principal Problem:   S/P reverse total shoulder arthroplasty, right   Past Medical History:  Diagnosis Date   Abscess    ADD (attention deficit disorder)    ADHD    patient denies   Allergy    Anxiety    Arthritis    Chronic back pain    Congenital prolapsed rectum    COPD (chronic obstructive pulmonary disease) (HCC)    Fibromyalgia    History of kidney stones    Kidney failure    acute - reaction to sulfa drugs   MRSA (methicillin resistant staph aureus) culture positive    Hx: of   Muscle spasm    arms   Muscle spasms of both lower extremities    Numbness    Panic attack    Pneumonia    2009   Wears dentures    full upper and lower    Surgeries: Procedure(s): RIGHT REVERSE SHOULDER ARTHROPLASTY on 06/29/2023   Consultants (if any):   Discharged Condition: Improved  Hospital Course: AMIRRA BLODGETT is an 62 y.o. female who was admitted 06/29/2023 with a diagnosis of S/P reverse total shoulder arthroplasty, right and went to the operating room on 06/29/2023 and underwent the above named procedures.    She was given perioperative antibiotics:  Anti-infectives (From admission, onward)    Start     Dose/Rate Route Frequency Ordered Stop   06/29/23 2200  vancomycin (VANCOCIN) IVPB 1000 mg/200 mL premix        1,000 mg 200 mL/hr over 60 Minutes Intravenous Every 12 hours 06/29/23 1635 06/29/23 2110   06/29/23 1033  vancomycin (VANCOCIN) powder  Status:  Discontinued          As needed 06/29/23 1033 06/29/23 1632   06/29/23 0815  levofloxacin (LEVAQUIN) IVPB 500 mg        500 mg 100 mL/hr over 60 Minutes Intravenous On call to O.R. 06/29/23 0810 06/29/23 1010   06/29/23 0815  vancomycin (VANCOCIN) IVPB 1000 mg/200 mL premix         1,000 mg 200 mL/hr over 60 Minutes Intravenous On call to O.R. 06/29/23 7829 06/29/23 0943     .  She was given sequential compression devices, early ambulation for DVT prophylaxis.  She benefited maximally from the hospital stay and there were no complications.    Recent vital signs:  Vitals:   06/30/23 0138 06/30/23 0615  BP: (!) 121/97 103/65  Pulse: 80 82  Resp: 17 17  Temp: 98.1 F (36.7 C) 98.5 F (36.9 C)  SpO2: 94% 96%    Recent laboratory studies:  Lab Results  Component Value Date   HGB 12.0 06/21/2023   HGB 12.7 02/25/2023   HGB 11.7 (L) 01/01/2023   Lab Results  Component Value Date   WBC 11.4 (H) 06/21/2023   PLT 384 06/21/2023   Lab Results  Component Value Date   INR 1.0 12/29/2020   Lab Results  Component Value Date   NA 139 02/25/2023   K 4.7 02/25/2023   CL 98 02/25/2023   CO2 25 02/25/2023   BUN 26 02/25/2023   CREATININE 1.28 (H) 02/25/2023   GLUCOSE 111 (H) 02/25/2023    Discharge Medications:   Allergies as of 06/30/2023       Reactions  Bee Venom Anaphylaxis, Other (See Comments)   Bees/wasps/yellow jackets   Keflex [cephalexin] Anaphylaxis   Penicillins Anaphylaxis, Other (See Comments)   Has patient had a PCN reaction causing immediate rash, facial/tongue/throat swelling, SOB or lightheadedness with hypotension: Yes Has patient had a PCN reaction causing severe rash involving mucus membranes or skin necrosis: No Has patient had a PCN reaction that required hospitalization Yes, in the hospital already Has patient had a PCN reaction occurring within the last 10 years: Yes If all of the above answers are "NO", then may proceed with Cephalosporin use.   Sulfa Antibiotics Other (See Comments)   Renal failure   Hydrocodone Rash, Other (See Comments)   "I couldn't get my breath"   Ibuprofen Other (See Comments)   Vomiting blood   Sulfur Dioxide Hives   Contrast Media [iodinated Contrast Media] Other (See Comments)   Patient  states she had a previous reaction to iodinated contrast media agents. Premedicate.   Nsaids Itching   Codeine Itching, Rash   Cyclobenzaprine Other (See Comments)   Severe constipation   Fentanyl Nausea And Vomiting, Other (See Comments)   Severe vomiting As of 01/06/21 pt states she is not allergic   Methadone Other (See Comments)   Change in mental status   Neurontin [gabapentin] Hives   Tape Itching, Rash, Other (See Comments)   Blisters skin, Please use "paper" tape   Tegretol [carbamazepine] Hives, Rash   Toradol [ketorolac Tromethamine] Hives, Rash   Tramadol Hives, Rash        Medication List     TAKE these medications    albuterol 108 (90 Base) MCG/ACT inhaler Commonly known as: Ventolin HFA TAKE 2 PUFFS BY MOUTH EVERY 6 HOURS AS NEEDED FOR WHEEZE OR SHORTNESS OF BREATH   alendronate 70 MG tablet Commonly known as: Fosamax Take 1 tablet (70 mg total) by mouth every 7 (seven) days. Take with a full glass of water on an empty stomach.   ALPRAZolam 1 MG tablet Commonly known as: XANAX Take 1 mg by mouth 4 (four) times daily.   amphetamine-dextroamphetamine 30 MG tablet Commonly known as: ADDERALL Take 30 mg by mouth 2 (two) times daily.   b complex vitamins capsule Take 1 capsule by mouth daily.   Breztri Aerosphere 160-9-4.8 MCG/ACT Aero Generic drug: Budeson-Glycopyrrol-Formoterol Inhale 2 puffs into the lungs in the morning and at bedtime.   cholecalciferol 25 MCG (1000 UNIT) tablet Commonly known as: VITAMIN D3 Take 1,000 Units by mouth daily.   EPINEPHrine 0.3 mg/0.3 mL Soaj injection Commonly known as: EpiPen 2-Pak Inject 0.3 mg into the muscle as needed for anaphylaxis.   gabapentin 100 MG capsule Commonly known as: NEURONTIN Take 100 mg by mouth daily.   guaiFENesin 100 MG/5ML liquid Commonly known as: ROBITUSSIN Take 10 mLs (200 mg total) by mouth every 4 (four) hours as needed for cough.   ipratropium-albuterol 0.5-2.5 (3) MG/3ML  Soln Commonly known as: DUONEB Take 3 mLs by nebulization every 6 (six) hours as needed.   levothyroxine 88 MCG tablet Commonly known as: Synthroid Take 1 tablet (88 mcg total) by mouth daily.   magnesium oxide 400 (240 Mg) MG tablet Commonly known as: MAG-OX Take 400 mg by mouth daily.   ondansetron 4 MG tablet Commonly known as: Zofran Take 1 tablet (4 mg total) by mouth every 8 (eight) hours as needed for nausea or vomiting.   oxyCODONE 5 MG immediate release tablet Commonly known as: Roxicodone Take 1-2 tablets (5-10 mg total) by mouth  every 4 (four) hours as needed for severe pain (pain score 7-10).   pregabalin 50 MG capsule Commonly known as: Lyrica Take 1 capsule (50 mg total) by mouth 3 (three) times daily.   promethazine 25 MG tablet Commonly known as: PHENERGAN Take 1 tablet (25 mg total) by mouth every 6 (six) hours as needed for nausea or vomiting.   QUEtiapine 400 MG tablet Commonly known as: SEROQUEL Take 1 tablet (400 mg total) by mouth at bedtime.   sennosides-docusate sodium 8.6-50 MG tablet Commonly known as: SENOKOT-S Take 2 tablets by mouth daily.   tiZANidine 4 MG tablet Commonly known as: Zanaflex Take 1 tablet (4 mg total) by mouth 3 (three) times daily as needed for muscle spasms.   Vitamin D (Ergocalciferol) 1.25 MG (50000 UNIT) Caps capsule Commonly known as: DRISDOL Take 1 capsule (50,000 Units total) by mouth once a week.        Diagnostic Studies: DG Shoulder Right Port  Result Date: 06/29/2023 CLINICAL DATA:  Status post reverse total right shoulder arthroplasty. EXAM: RIGHT SHOULDER - 1 VIEW COMPARISON:  Right shoulder radiographs 06/01/2022 FINDINGS: Interval reverse total right shoulder arthroplasty. On limited single frontal view, no perihardware lucency is seen to indicate hardware failure or loosening. Expected postoperative subcutaneous and intra-articular air. Moderate acromioclavicular joint space narrowing and peripheral  osteophytosis. No acute fracture or dislocation. The visualized portion of the right lung is unremarkable. Moderate atherosclerotic calcification within the aortic arch. ACDF hardware is partially visualized overlying the cervical spine. IMPRESSION: Interval reverse total right shoulder arthroplasty without evidence of hardware failure. Electronically Signed   By: Neita Garnet M.D.   On: 06/29/2023 15:06    Disposition: Discharge disposition: 01-Home or Self Care          Follow-up Information     Teryl Lucy, MD. Schedule an appointment as soon as possible for a visit in 2 week(s).   Specialty: Orthopedic Surgery Contact information: [address] [place] [address] [phone]                  Signed: Annita Brod 06/30/2023, 8:17 AM

## 2023-06-30 NOTE — Care Management Important Message (Signed)
Important Message  Patient Details IM Letter given Name: Jamie Schultz MRN: [id number] Date of Birth: June 15, 1961   Important Message Given:  Yes - Medicare IM     Caren Macadam 06/30/2023, 9:07 AM

## 2023-06-30 NOTE — Plan of Care (Signed)
  Problem: Coping: Goal: Level of anxiety will decrease Outcome: Progressing   Problem: Pain Management: Goal: General experience of comfort will improve Outcome: Progressing   Problem: Safety: Goal: Ability to remain free from injury will improve Outcome: Progressing

## 2023-06-30 NOTE — Progress Notes (Signed)
   06/30/23 0842  TOC Brief Assessment  Insurance and Status Reviewed  Patient has primary care physician Yes  Home environment has been reviewed Resides with spouse  Prior level of function: Independent with ADLs at baseline  Prior/Current Home Services No current home services  Social Determinants of Health Reivew SDOH reviewed no interventions necessary  Readmission risk has been reviewed Yes  Transition of care needs no transition of care needs at this time

## 2023-06-30 NOTE — Plan of Care (Signed)
  Problem: Activity: Goal: Risk for activity intolerance will decrease Outcome: Progressing   Problem: Pain Management: Goal: General experience of comfort will improve Outcome: Progressing   Problem: Safety: Goal: Ability to remain free from injury will improve Outcome: Progressing

## 2023-06-30 NOTE — Progress Notes (Signed)
Discharge package printed and instruciton given to pt. Pt verbalizes understanding.

## 2023-07-03 ENCOUNTER — Other Ambulatory Visit: Payer: Self-pay | Admitting: Student

## 2023-09-10 ENCOUNTER — Other Ambulatory Visit: Payer: Self-pay

## 2023-09-10 ENCOUNTER — Other Ambulatory Visit: Payer: Self-pay | Admitting: Internal Medicine

## 2023-09-10 ENCOUNTER — Telehealth: Payer: Self-pay

## 2023-09-10 DIAGNOSIS — M797 Fibromyalgia: Secondary | ICD-10-CM

## 2023-09-10 MED ORDER — PREGABALIN 50 MG PO CAPS: 50.0000 mg | ORAL_CAPSULE | Freq: Three times a day (TID) | ORAL | 2 refills | Status: DC

## 2023-09-10 MED ORDER — TIZANIDINE HCL 4 MG PO TABS: 4.0000 mg | ORAL_TABLET | Freq: Three times a day (TID) | ORAL | 0 refills | Status: DC | PRN

## 2023-09-10 NOTE — Telephone Encounter (Signed)
 Patient informed.

## 2023-09-10 NOTE — Telephone Encounter (Signed)
 Patient is also requesting to get  rx called in to help her with not going to the bathroom as much at night pt states once she gets up to pee she can't go back to sleep

## 2023-09-16 ENCOUNTER — Ambulatory Visit: Payer: Medicaid Other | Admitting: Internal Medicine

## 2023-10-19 ENCOUNTER — Ambulatory Visit (INDEPENDENT_AMBULATORY_CARE_PROVIDER_SITE_OTHER): Payer: Medicaid Other | Admitting: Cardiology

## 2023-10-19 ENCOUNTER — Encounter: Payer: Self-pay | Admitting: Cardiology

## 2023-10-19 VITALS — BP 188/110 | HR 90 | Ht 60.0 in | Wt 128.0 lb

## 2023-10-19 DIAGNOSIS — R300 Dysuria: Secondary | ICD-10-CM | POA: Diagnosis not present

## 2023-10-19 DIAGNOSIS — J0140 Acute pansinusitis, unspecified: Secondary | ICD-10-CM | POA: Insufficient documentation

## 2023-10-19 DIAGNOSIS — I10 Essential (primary) hypertension: Secondary | ICD-10-CM

## 2023-10-19 DIAGNOSIS — R8290 Unspecified abnormal findings in urine: Secondary | ICD-10-CM

## 2023-10-19 LAB — POCT URINALYSIS DIPSTICK
Bilirubin, UA: NEGATIVE
Blood, UA: NEGATIVE
Glucose, UA: NEGATIVE
Ketones, UA: NEGATIVE
Leukocytes, UA: NEGATIVE
Nitrite, UA: NEGATIVE
Protein, UA: NEGATIVE
Spec Grav, UA: 1.02 (ref 1.010–1.025)
Urobilinogen, UA: 0.2 U/dL
pH, UA: 6 (ref 5.0–8.0)

## 2023-10-19 MED ORDER — LEVOFLOXACIN 500 MG PO TABS: 500.0000 mg | ORAL_TABLET | Freq: Every day | ORAL | 0 refills | Status: AC

## 2023-10-19 MED ORDER — BENZONATATE 100 MG PO CAPS: 100.0000 mg | ORAL_CAPSULE | Freq: Three times a day (TID) | ORAL | 1 refills | Status: DC | PRN

## 2023-10-19 MED ORDER — LEVOFLOXACIN 500 MG PO TABS: 500.0000 mg | ORAL_TABLET | Freq: Every day | ORAL | 0 refills | Status: DC

## 2023-10-19 MED ORDER — SOLIFENACIN SUCCINATE 10 MG PO TABS: 10.0000 mg | ORAL_TABLET | Freq: Every day | ORAL | 2 refills | Status: DC
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
## 2023-10-21 LAB — URINE CULTURE: Organism ID, Bacteria: NO GROWTH

## 2023-10-22 NOTE — Progress Notes (Signed)
 Patient notified

## 2023-11-02 ENCOUNTER — Ambulatory Visit: Payer: Medicaid Other | Admitting: Urology

## 2023-11-02 DIAGNOSIS — R300 Dysuria: Secondary | ICD-10-CM

## 2023-11-03 ENCOUNTER — Encounter: Payer: Self-pay | Admitting: Urology

## 2023-11-09 ENCOUNTER — Ambulatory Visit: Admitting: Urology

## 2023-12-07 ENCOUNTER — Other Ambulatory Visit: Payer: Self-pay | Admitting: Internal Medicine

## 2023-12-07 ENCOUNTER — Telehealth: Payer: Self-pay | Admitting: Internal Medicine

## 2023-12-07 DIAGNOSIS — Z91030 Bee allergy status: Secondary | ICD-10-CM

## 2023-12-07 MED ORDER — EPINEPHRINE 0.3 MG/0.3ML IJ SOAJ: 0.3000 mg | INTRAMUSCULAR | 2 refills | Status: AC | PRN

## 2023-12-07 NOTE — Telephone Encounter (Signed)
 Patient left VM that she needs an Rx sent in for a 2 pack of Epi pens. States she almost got stung by a bee yesterday and if she gets stung she has "about 3 minutes before throat starts swelling up". Please send Rx.

## 2023-12-07 NOTE — Telephone Encounter (Signed)
 Patient called again today and left VM that she is having issues with her urinary system. States that her urine is very hot. Said she does not having any pain when urinating or frequency. Wants to see NK only.

## 2023-12-10 ENCOUNTER — Other Ambulatory Visit: Payer: Self-pay

## 2023-12-10 ENCOUNTER — Ambulatory Visit: Admitting: Urology

## 2023-12-10 DIAGNOSIS — R300 Dysuria: Secondary | ICD-10-CM

## 2023-12-16 ENCOUNTER — Ambulatory Visit: Admitting: Urology

## 2023-12-16 ENCOUNTER — Encounter: Payer: Self-pay | Admitting: Urology

## 2023-12-16 VITALS — BP 158/86 | HR 68 | Ht 60.0 in | Wt 130.0 lb

## 2023-12-16 DIAGNOSIS — R300 Dysuria: Secondary | ICD-10-CM

## 2023-12-16 DIAGNOSIS — N3281 Overactive bladder: Secondary | ICD-10-CM

## 2023-12-16 LAB — MICROSCOPIC EXAMINATION

## 2023-12-16 LAB — URINALYSIS, COMPLETE
Bilirubin, UA: NEGATIVE
Glucose, UA: NEGATIVE
Ketones, UA: NEGATIVE
Leukocytes,UA: NEGATIVE
Nitrite, UA: NEGATIVE
Protein,UA: NEGATIVE
RBC, UA: NEGATIVE
Specific Gravity, UA: 1.02 (ref 1.005–1.030)
Urobilinogen, Ur: 0.2 mg/dL (ref 0.2–1.0)
pH, UA: 7 (ref 5.0–7.5)

## 2023-12-16 MED ORDER — GEMTESA 75 MG PO TABS: 75.0000 mg | ORAL_TABLET | Freq: Every day | ORAL | Status: DC
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
## 2024-01-30 ENCOUNTER — Other Ambulatory Visit: Payer: Self-pay | Admitting: Internal Medicine

## 2024-01-30 DIAGNOSIS — J432 Centrilobular emphysema: Secondary | ICD-10-CM

## 2024-02-03 ENCOUNTER — Telehealth: Payer: Self-pay | Admitting: Urology

## 2024-02-03 ENCOUNTER — Other Ambulatory Visit: Admitting: Urology

## 2024-02-03 ENCOUNTER — Other Ambulatory Visit: Payer: Self-pay

## 2024-02-03 ENCOUNTER — Other Ambulatory Visit: Payer: Self-pay | Admitting: *Deleted

## 2024-02-03 MED ORDER — LINACLOTIDE 145 MCG PO CAPS: 145.0000 ug | ORAL_CAPSULE | Freq: Two times a day (BID) | ORAL | 2 refills | Status: DC | PRN

## 2024-02-03 MED ORDER — ONDANSETRON HCL 4 MG PO TABS: 4.0000 mg | ORAL_TABLET | Freq: Three times a day (TID) | ORAL | 0 refills | Status: DC | PRN

## 2024-02-03 MED ORDER — GEMTESA 75 MG PO TABS: 75.0000 mg | ORAL_TABLET | Freq: Every day | ORAL | 11 refills | Status: DC

## 2024-02-03 NOTE — Telephone Encounter (Signed)
 Pt also wants to know if she can get any Toradol  for pain or something comparable.

## 2024-02-03 NOTE — Telephone Encounter (Signed)
 Please send Gemtesa  to [facility] in [place].  Cysto was r/s for 6/18.

## 2024-02-04 MED ORDER — GEMTESA 75 MG PO TABS: 75.0000 mg | ORAL_TABLET | Freq: Every day | ORAL | 11 refills | Status: DC

## 2024-02-07 ENCOUNTER — Telehealth: Payer: Self-pay | Admitting: Urology

## 2024-02-07 NOTE — Telephone Encounter (Signed)
 Patient called stating that insurance will not cover Gemtesa . Patient is requesting that a new medication that works just as well can be called into the Pharmacy. Please advise patient.

## 2024-02-07 NOTE — Telephone Encounter (Signed)
Will advise patient. °

## 2024-02-10 NOTE — Telephone Encounter (Signed)
 Pt LM on triage line stating that she cannot afford Gemtesa  and would like an alternative medication sent in.

## 2024-02-16 ENCOUNTER — Other Ambulatory Visit: Payer: Self-pay | Admitting: *Deleted

## 2024-02-16 ENCOUNTER — Other Ambulatory Visit

## 2024-02-16 ENCOUNTER — Other Ambulatory Visit: Admitting: Urology

## 2024-02-16 DIAGNOSIS — R300 Dysuria: Secondary | ICD-10-CM

## 2024-02-16 LAB — MICROSCOPIC EXAMINATION: Epithelial Cells (non renal): >10 /HPF — AB (ref 0–10)

## 2024-02-16 LAB — URINALYSIS, COMPLETE
Bilirubin, UA: NEGATIVE
Glucose, UA: NEGATIVE
Ketones, UA: NEGATIVE
Leukocytes,UA: NEGATIVE
Nitrite, UA: POSITIVE — AB
Protein,UA: NEGATIVE
RBC, UA: NEGATIVE
Specific Gravity, UA: 1.025 (ref 1.005–1.030)
Urobilinogen, Ur: 0.2 mg/dL (ref 0.2–1.0)
pH, UA: 6 (ref 5.0–7.5)

## 2024-02-17 ENCOUNTER — Other Ambulatory Visit: Admitting: Urology

## 2024-02-29 ENCOUNTER — Telehealth: Payer: Self-pay | Admitting: Urology

## 2024-02-29 DIAGNOSIS — R300 Dysuria: Secondary | ICD-10-CM

## 2024-02-29 DIAGNOSIS — N3281 Overactive bladder: Secondary | ICD-10-CM

## 2024-02-29 NOTE — Telephone Encounter (Signed)
 Patient called and stated she would like to have Cysto done in the hospital because she would like to be put to sleep during procedure. She didn't want to cancel her in office appointment until she knows when it can be done in the hospital. She doesn't want to delay having it done. She is currently scheduled for 03/31/24.

## 2024-03-08 ENCOUNTER — Other Ambulatory Visit: Payer: Self-pay

## 2024-03-08 DIAGNOSIS — R32 Unspecified urinary incontinence: Secondary | ICD-10-CM

## 2024-03-08 DIAGNOSIS — R300 Dysuria: Secondary | ICD-10-CM

## 2024-03-08 NOTE — Progress Notes (Unsigned)
 Surgical Physician Order Form [facility]  Dr. Glendia Barba, MD  * Scheduling expectation : Patient preference  *Length of Case: 20 minutes  *Clearance needed: no  *Anticoagulation Instructions: N/A  *Aspirin Instructions: N/A  *Post-op visit Date/Instructions: TBD  *Diagnosis: Dysuria, urinary incontinence  *Procedure: Cystoscopy; possible bladder biopsy   Additional orders: N/A  -Admit type: OUTpatient  -Anesthesia: MAC  -VTE Prophylaxis Standing Order SCD's       Other:   -Standing Lab Orders Per Anesthesia    Lab other: UA&Urine Culture  -Standing Test orders EKG/Chest x-ray per Anesthesia       Test other:   - Medications: Cipro  500 mg p.o. with a sip of water  prior to procedure  -Other orders:  N/A

## 2024-03-09 ENCOUNTER — Telehealth: Payer: Self-pay

## 2024-03-09 NOTE — Telephone Encounter (Signed)
 Per Dr. Twylla, Patient is to be scheduled for Diagnostic Cystoscopy with Possible Bladder Biopsy   Ms. Jamie Schultz was contacted and possible surgical dates were discussed, Tuesday July 22nd, 2025 was agreed upon for surgery.   Patient was instructed that Dr. Twylla will require them to provide a pre-op UA & CX prior to surgery. This was ordered and scheduled drop off appointment was made for 03/13/2024.    Patient was directed to call [phone] between 1-3pm the day before surgery to find out surgical arrival time.  Instructions were given not to eat or drink from midnight on the night before surgery and have a driver for the day of surgery. On the surgery day patient was instructed to enter through the Medical Mall entrance of [facility] report the Same Day Surgery desk.   Pre-Admit Testing will be in contact via phone to set up an interview with the anesthesia team to review your history and medications prior to surgery.   Reminder of this information was sent via MyChart to the patient.

## 2024-03-09 NOTE — Progress Notes (Signed)
   [facility] Surgical Posting Form  Surgery Date: Date: 03/21/2024  Surgeon: Dr. Glendia Barba, MD  Inpt ( No  )   Outpt (Yes)   Obs ( No  )   Diagnosis: R30.0 Dysuria, R32 Urinary Incontinence   -CPT: 52000, 47795  Surgery: Diagnostic Cystoscopy with Possible Bladder Biopsy  Stop Anticoagulations: N/A  Cardiac/Medical/Pulmonary Clearance needed: no  *Orders entered into EPIC  Date: 03/09/24   *Case booked in EPIC  Date: 03/09/24  *Notified pt of Surgery: Date: 03/09/24  PRE-OP UA & CX: yes, will obtain in clinic on 03/13/2024  *Placed into Prior Authorization Work [place] Date: 03/09/24  Assistant/laser/rep:No

## 2024-03-13 ENCOUNTER — Other Ambulatory Visit

## 2024-03-14 ENCOUNTER — Encounter
Admission: RE | Admit: 2024-03-14 | Discharge: 2024-03-14 | Disposition: A | Discharge status: Home / Self Care | Source: Ambulatory Visit | Attending: Urology | Admitting: Urology

## 2024-03-14 ENCOUNTER — Other Ambulatory Visit: Payer: Self-pay

## 2024-03-14 VITALS — Ht 60.0 in | Wt 130.0 lb

## 2024-03-14 DIAGNOSIS — U071 COVID-19: Secondary | ICD-10-CM

## 2024-03-14 DIAGNOSIS — I700 Atherosclerosis of aorta: Secondary | ICD-10-CM

## 2024-03-14 DIAGNOSIS — E785 Hyperlipidemia, unspecified: Secondary | ICD-10-CM

## 2024-03-14 DIAGNOSIS — I10 Essential (primary) hypertension: Secondary | ICD-10-CM

## 2024-03-14 DIAGNOSIS — R739 Hyperglycemia, unspecified: Secondary | ICD-10-CM

## 2024-03-14 NOTE — Patient Instructions (Addendum)
 Your procedure is scheduled on: TUESDAY 03/21/24 Report to the Registration Desk on the 1st floor of the Medical Mall. To find out your arrival time, please call [phone] between 1PM - 3PM on: MONDAY 03/20/24 If your arrival time is 6:00 am, do not arrive before that time as the Medical Mall entrance doors do not open until 6:00 am.  REMEMBER: Instructions that are not followed completely may result in serious medical risk, up to and including death; or upon the discretion of your surgeon and anesthesiologist your surgery may need to be rescheduled.  Do not eat food or drink any liquids after midnight the night before surgery.  No gum chewing or hard candies.  One week prior to surgery: Stop Anti-inflammatories (NSAIDS) such as Advil , Aleve, Ibuprofen , Motrin , Naproxen, Naprosyn and Aspirin based products such as Excedrin, Goody's Powder, BC Powder.  You may however, continue to take Tylenol  if needed for pain up until the day of surgery  Stop ALL OVER THE COUNTER supplements and vitamins until after surgery.  Continue taking all of your other prescription medications up until the day of surgery.  ON THE DAY OF SURGERY ONLY TAKE THESE MEDICATIONS WITH SIPS OF WATER :  ALPRAZolam  (XANAX ) 1 MG tablet   Use inhalers on the day of surgery and bring to the hospital.  No Alcohol for 24 hours before or after surgery.  No Smoking including e-cigarettes for 24 hours before surgery.  No chewable tobacco products for at least 6 hours before surgery.  No nicotine  patches on the day of surgery.  Do not use any recreational drugs for at least a week (preferably 2 weeks) before your surgery.  Please be advised that the combination of cocaine and anesthesia may have negative outcomes, up to and including death. If you test positive for cocaine, your surgery will be cancelled.  On the morning of surgery brush your teeth with toothpaste and water , you may rinse your mouth with mouthwash if you  wish. Do not swallow any toothpaste or mouthwash.  Shower the morning of surgery  Do not shave body hair from the neck down 48 hours before surgery.  Do not wear lotions, powders, or perfumes.  Do not wear jewelry, make-up, hairpins, clips or nail polish.  For welded (permanent) jewelry: bracelets, anklets, waist bands, etc.  Please have this removed prior to surgery.  If it is not removed, there is a chance that hospital personnel will need to cut it off on the day of surgery.  Wear comfortable clothing (specific to your surgery type) to the hospital.  Contact lenses, hearing aids and dentures may not be worn into surgery. Bring a case for glasses  Do not bring valuables to the hospital. [facility] is not responsible for any missing/lost belongings or valuables.   Notify your doctor if there is any change in your medical condition (cold, fever, infection).  After surgery, you can help prevent lung complications by doing breathing exercises.  Take deep breaths and cough every 1-2 hours. Your doctor may order a device called an Incentive Spirometer to help you take deep breaths.  If you are being discharged the day of surgery, you will not be allowed to drive home. You will need a responsible individual to drive you home and stay with you for 24 hours after surgery.   If you are taking public transportation, you will need to have a responsible individual with you.  Please call the Pre-admissions Testing Dept. at [phone] if you have  any questions about these instructions.  Surgery Visitation Policy:  Patients having surgery or a procedure may have two visitors.  Children under the age of 34 must have an adult with them who is not the patient.   Merchandiser, retail to address health-related social needs:  [email or web address]

## 2024-03-15 ENCOUNTER — Inpatient Hospital Stay: Admission: RE | Admit: 2024-03-15 | Source: Ambulatory Visit

## 2024-03-16 ENCOUNTER — Encounter
Admission: RE | Admit: 2024-03-16 | Discharge: 2024-03-16 | Disposition: A | Discharge status: Home / Self Care | Source: Ambulatory Visit | Attending: Urology | Admitting: Urology

## 2024-03-16 DIAGNOSIS — Z8619 Personal history of other infectious and parasitic diseases: Secondary | ICD-10-CM | POA: Diagnosis not present

## 2024-03-16 DIAGNOSIS — E785 Hyperlipidemia, unspecified: Secondary | ICD-10-CM | POA: Insufficient documentation

## 2024-03-16 DIAGNOSIS — R9431 Abnormal electrocardiogram [ECG] [EKG]: Secondary | ICD-10-CM | POA: Insufficient documentation

## 2024-03-16 DIAGNOSIS — R739 Hyperglycemia, unspecified: Secondary | ICD-10-CM | POA: Diagnosis not present

## 2024-03-16 DIAGNOSIS — Z01818 Encounter for other preprocedural examination: Secondary | ICD-10-CM | POA: Diagnosis present

## 2024-03-16 DIAGNOSIS — Z01810 Encounter for preprocedural cardiovascular examination: Secondary | ICD-10-CM | POA: Diagnosis not present

## 2024-03-16 DIAGNOSIS — J069 Acute upper respiratory infection, unspecified: Secondary | ICD-10-CM | POA: Insufficient documentation

## 2024-03-16 DIAGNOSIS — U071 COVID-19: Secondary | ICD-10-CM

## 2024-03-16 DIAGNOSIS — I10 Essential (primary) hypertension: Secondary | ICD-10-CM | POA: Insufficient documentation

## 2024-03-16 DIAGNOSIS — I700 Atherosclerosis of aorta: Secondary | ICD-10-CM | POA: Insufficient documentation

## 2024-03-16 LAB — CBC
HCT: 45.1 % (ref 36.0–46.0)
Hemoglobin: 14.8 g/dL (ref 12.0–15.0)
MCH: 30.8 pg (ref 26.0–34.0)
MCHC: 32.8 g/dL (ref 30.0–36.0)
MCV: 93.8 fL (ref 80.0–100.0)
Platelets: 378 K/uL (ref 150–400)
RBC: 4.81 MIL/uL (ref 3.87–5.11)
RDW: 13.9 % (ref 11.5–15.5)
WBC: 7.7 K/uL (ref 4.0–10.5)
nRBC: 0 % (ref 0.0–0.2)

## 2024-03-16 LAB — BASIC METABOLIC PANEL WITH GFR
Anion gap: 11 (ref 5–15)
BUN: 12 mg/dL (ref 8–23)
CO2: 24 mmol/L (ref 22–32)
Calcium: 9.7 mg/dL (ref 8.9–10.3)
Chloride: 103 mmol/L (ref 98–111)
Creatinine, Ser: 0.71 mg/dL (ref 0.44–1.00)
GFR, Estimated: >60 mL/min (ref >60)
Glucose, Bld: 81 mg/dL (ref 70–99)
Potassium: 3.9 mmol/L (ref 3.5–5.1)
Sodium: 138 mmol/L (ref 135–145)

## 2024-03-17 ENCOUNTER — Encounter: Payer: Self-pay | Admitting: Urgent Care

## 2024-03-17 ENCOUNTER — Inpatient Hospital Stay: Admission: RE | Admit: 2024-03-17 | Source: Ambulatory Visit

## 2024-03-21 ENCOUNTER — Encounter
Admission: RE | Disposition: A | Discharge status: Home / Self Care | Payer: Self-pay | Source: Home / Self Care | Attending: Urology

## 2024-03-21 ENCOUNTER — Ambulatory Visit
Admission: RE | Admit: 2024-03-21 | Discharge: 2024-03-21 | Disposition: A | Discharge status: Home / Self Care | Attending: Urology | Admitting: Urology

## 2024-03-21 ENCOUNTER — Ambulatory Visit

## 2024-03-21 ENCOUNTER — Other Ambulatory Visit: Payer: Self-pay

## 2024-03-21 ENCOUNTER — Encounter: Payer: Self-pay | Admitting: Urology

## 2024-03-21 DIAGNOSIS — R3989 Other symptoms and signs involving the genitourinary system: Secondary | ICD-10-CM | POA: Diagnosis not present

## 2024-03-21 DIAGNOSIS — R300 Dysuria: Secondary | ICD-10-CM | POA: Diagnosis present

## 2024-03-21 DIAGNOSIS — N3582 Other urethral stricture, female: Secondary | ICD-10-CM

## 2024-03-21 DIAGNOSIS — R32 Unspecified urinary incontinence: Secondary | ICD-10-CM

## 2024-03-21 HISTORY — PX: CYSTOSCOPY WITH URETHRAL DILATATION: SHX5125

## 2024-03-21 SURGERY — CYSTOSCOPY, WITH URETHRAL DILATION
Anesthesia: General | Site: Bladder

## 2024-03-21 MED ORDER — CHLORHEXIDINE GLUCONATE 0.12 % MT SOLN
15.0000 mL | Freq: Once | OROMUCOSAL | Status: AC
  Administered 2024-03-21: 15 mL via OROMUCOSAL

## 2024-03-21 MED ORDER — OXYCODONE HCL 5 MG PO TABS: 5.0000 mg | ORAL_TABLET | Freq: Four times a day (QID) | ORAL | 0 refills | Status: DC | PRN

## 2024-03-21 MED ORDER — OXYCODONE HCL 5 MG/5ML PO SOLN: 5.0000 mg | Freq: Once | ORAL | Status: AC | PRN

## 2024-03-21 MED ORDER — CHLORHEXIDINE GLUCONATE 0.12 % MT SOLN
OROMUCOSAL | Status: AC
  Filled 2024-03-21: qty 15

## 2024-03-21 MED ORDER — PHENYLEPHRINE 80 MCG/ML (10ML) SYRINGE FOR IV PUSH (FOR BLOOD PRESSURE SUPPORT)
PREFILLED_SYRINGE | INTRAVENOUS | Status: DC | PRN
  Administered 2024-03-21: 160 ug via INTRAVENOUS

## 2024-03-21 MED ORDER — MIDAZOLAM HCL 2 MG/2ML IJ SOLN
INTRAMUSCULAR | Status: DC | PRN
  Administered 2024-03-21: 2 mg via INTRAVENOUS

## 2024-03-21 MED ORDER — ORAL CARE MOUTH RINSE: 15.0000 mL | Freq: Once | OROMUCOSAL | Status: AC

## 2024-03-21 MED ORDER — OXYCODONE HCL 5 MG PO TABS
5.0000 mg | ORAL_TABLET | Freq: Once | ORAL | Status: AC | PRN
  Administered 2024-03-21: 5 mg via ORAL

## 2024-03-21 MED ORDER — FENTANYL CITRATE PF 50 MCG/ML IJ SOSY
50.0000 ug | PREFILLED_SYRINGE | Freq: Once | INTRAMUSCULAR | Status: AC
  Administered 2024-03-21: 50 ug via INTRAVENOUS

## 2024-03-21 MED ORDER — MIDAZOLAM HCL 2 MG/2ML IJ SOLN
INTRAMUSCULAR | Status: AC
  Filled 2024-03-21: qty 2

## 2024-03-21 MED ORDER — LACTATED RINGERS IV SOLN: INTRAVENOUS | Status: DC

## 2024-03-21 MED ORDER — PHENAZOPYRIDINE HCL 200 MG PO TABS: 200.0000 mg | ORAL_TABLET | Freq: Three times a day (TID) | ORAL | 0 refills | Status: DC | PRN

## 2024-03-21 MED ORDER — PROPOFOL 500 MG/50ML IV EMUL
INTRAVENOUS | Status: DC | PRN
  Administered 2024-03-21: 125 ug/kg/min via INTRAVENOUS
  Administered 2024-03-21: 50 mg via INTRAVENOUS

## 2024-03-21 MED ORDER — CIPROFLOXACIN IN D5W 400 MG/200ML IV SOLN
INTRAVENOUS | Status: AC
Start: 2024-03-21 — End: 2024-03-21
  Filled 2024-03-21: qty 200

## 2024-03-21 MED ORDER — FENTANYL CITRATE PF 50 MCG/ML IJ SOSY
PREFILLED_SYRINGE | INTRAMUSCULAR | Status: AC
  Filled 2024-03-21: qty 1

## 2024-03-21 MED ORDER — PHENYLEPHRINE 80 MCG/ML (10ML) SYRINGE FOR IV PUSH (FOR BLOOD PRESSURE SUPPORT)
PREFILLED_SYRINGE | INTRAVENOUS | Status: AC
  Filled 2024-03-21: qty 10

## 2024-03-21 MED ORDER — CIPROFLOXACIN HCL 500 MG PO TABS
500.0000 mg | ORAL_TABLET | Freq: Once | ORAL | Status: AC
  Administered 2024-03-21: 500 mg via ORAL
  Filled 2024-03-21: qty 1

## 2024-03-21 MED ORDER — STERILE WATER FOR IRRIGATION IR SOLN
Status: DC | PRN
Start: 2024-03-21 — End: 2024-03-21
  Administered 2024-03-21: 3000 mL

## 2024-03-21 MED ORDER — OXYCODONE HCL 5 MG PO TABS
ORAL_TABLET | ORAL | Status: AC
  Filled 2024-03-21: qty 1

## 2024-03-21 MED ORDER — PROPOFOL 1000 MG/100ML IV EMUL
INTRAVENOUS | Status: AC
  Filled 2024-03-21: qty 100

## 2024-03-21 SURGICAL SUPPLY — 16 items
BAG DRAIN SIEMENS DORNER NS (MISCELLANEOUS) ×2 IMPLANT
BRUSH SCRUB EZ 4% CHG (MISCELLANEOUS) ×1 IMPLANT
DRSG TELFA 3X4 N-ADH STERILE (GAUZE/BANDAGES/DRESSINGS) ×2 IMPLANT
ELECTRODE REM PT RTRN 9FT ADLT (ELECTROSURGICAL) ×2 IMPLANT
GLOVE BIOGEL PI IND STRL 7.5 (GLOVE) ×3 IMPLANT
GOWN STRL REUS W/ TWL LRG LVL3 (GOWN DISPOSABLE) ×3 IMPLANT
GOWN STRL REUS W/ TWL XL LVL3 (GOWN DISPOSABLE) ×2 IMPLANT
KIT TURNOVER CYSTO (KITS) ×2 IMPLANT
NDL SAFETY ECLIPSE 18X1.5 (NEEDLE) ×1 IMPLANT
PACK CYSTO AR (MISCELLANEOUS) ×2 IMPLANT
SET CYSTO W/LG BORE CLAMP LF (SET/KITS/TRAYS/PACK) ×2 IMPLANT
SURGILUBE 2OZ TUBE FLIPTOP (MISCELLANEOUS) ×2 IMPLANT
SYR 10ML LL (SYRINGE) ×1 IMPLANT
SYRINGE TOOMEY IRRIG 70ML (MISCELLANEOUS) ×2 IMPLANT
WATER STERILE IRR 3000ML UROMA (IV SOLUTION) ×2 IMPLANT
WATER STERILE IRR 500ML POUR (IV SOLUTION) ×2 IMPLANT

## 2024-03-21 NOTE — Op Note (Signed)
   Preoperative diagnosis:  Chronic dysuria Storage related voiding symptoms  Postoperative diagnosis:  Same Urethral stenosis  Procedure: Urethral dilation Cystoscopy  Surgeon: Glendia JAYSON Barba, MD  Anesthesia: MAC  Complications: None  Intraoperative findings:  Tight urethra calibrated to 24F Bladder mucosa without erythema, solid or papillary lesions.  UOs normal-appearing bilaterally  EBL: Minimal  Specimens: None  Indication: Jamie Schultz is a 63 y.o. patient with chronic dysuria and storage related voiding symptoms.  Refer to admission H&P for details after reviewing the management options for treatment, he elected to proceed with the above surgical procedure(s). We have discussed the potential benefits and risks of the procedure, side effects of the proposed treatment, the likelihood of the patient achieving the goals of the procedure, and any potential problems that might occur during the procedure or recuperation. Informed consent has been obtained.  Description of procedure:  The patient was taken to the operating room and general anesthesia was induced.  The patient was placed in the dorsal lithotomy position, prepped and draped in the usual sterile fashion, and preoperative antibiotics were administered. A preoperative time-out was performed.   The urethra would not except a 21 French cystoscope sheath with obturator.  An 36F Walther sound easily advanced in the urethra and the urethra was tight to a 24F sound.  The urethra was subsequently dilated with serial sounds to 85F.  The cystoscope was then easily passed into the bladder.  Panendoscopy was performed with findings as described above.  The bladder was emptied and cystoscope was removed.  She was transported to the PACU in stable condition.  Plan: Postop follow-up will be scheduled ~1 month   Glendia JAYSON Barba, M.D.

## 2024-03-21 NOTE — Transfer of Care (Signed)
 Immediate Anesthesia Transfer of Care Note  Patient: Jamie Schultz  Procedure(s) Performed: CYSTOSCOPY, WITH URETHRAL DILATION (Bladder)  Patient Location: PACU  Anesthesia Type:General  Level of Consciousness: awake  Airway & Oxygen Therapy: Patient Spontanous Breathing  Post-op Assessment: Report given to RN and Post -op Vital signs reviewed and stable  Post vital signs: Reviewed and stable  Last Vitals:  Vitals Value Taken Time  BP 132/82 03/21/24 13:26  Temp    Pulse 72 03/21/24 13:29  Resp 20 03/21/24 13:29  SpO2 97 % 03/21/24 13:29  Vitals shown include unfiled device data.  Last Pain:  Vitals:   03/21/24 1144  TempSrc:   PainSc: 7          Complications: There were no known notable events for this encounter.
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
## 2024-03-21 NOTE — Progress Notes (Signed)
 Ride voucher (2) given to patient and patient spouse Krystal Sake. [employer] taxi transport. Approved by house supervisor Lonell.
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
## 2024-03-21 NOTE — Interval H&P Note (Signed)
 History and Physical Interval Note:  03/21/2024 12:51 PM  Jamie Schultz  has presented today for surgery, with the diagnosis of Dysuria, Urinary Incontinence.  The various methods of treatment have been discussed with the patient and family. After consideration of risks, benefits and other options for treatment, the patient has consented to  Procedure(s) with comments: CYSTOSCOPY (N/A) CYSTOSCOPY, WITH BIOPSY (N/A) - BLADDER as a surgical intervention.  The patient's history has been reviewed, patient examined, no change in status, stable for surgery.  I have reviewed the patient's chart and labs.  Questions were answered to the patient's satisfaction.     Timica Marcom C Hermilo Dutter

## 2024-03-21 NOTE — Discharge Instructions (Signed)
 Cystoscopy patient instructions  You may have bloody urine for two to three days (Call your doctor if the amount of bleeding increases or does not subside).  You may pass blood clots in your urine, especially if you had a biopsy. It is not unusual to pass small blood clots and have some bloody urine a couple of weeks after your cystoscopy. Again, call your doctor if the bleeding does not subside. You may have: Dysuria (painful urination) Frequency (urinating often) Urgency (strong desire to urinate)  These symptoms are common especially if medicine is instilled into the bladder or a ureteral stent is placed. Avoiding alcohol and caffeine, such as coffee, tea, and chocolate, may help relieve these symptoms. Drink plenty of water, unless otherwise instructed. Your doctor may also prescribe an antibiotic or other medicine to reduce these symptoms.  Cystoscopy results are available soon after the procedure; biopsy results usually take two to four days. Your doctor will discuss the results of your exam with you. Before you go home, you will be given specific instructions for follow-up care. Special Instructions:   If you are going home with a catheter in place do not take a tub bath until removed by your doctor.   You may resume your normal activities.   Do not drive or operate machinery if you are taking narcotic pain medicine.   Be sure to keep all follow-up appointments with your doctor.   Call Your Doctor If: The catheter is not draining You have severe pain You are unable to urinate You have a fever over 101 You have severe bleeding

## 2024-03-21 NOTE — Anesthesia Postprocedure Evaluation (Signed)
 Anesthesia Post Note  Patient: VELERA LANSDALE  Procedure(s) Performed: CYSTOSCOPY, WITH URETHRAL DILATION (Bladder)  Patient location during evaluation: PACU Anesthesia Type: General Level of consciousness: awake and alert Pain management: pain level controlled Vital Signs Assessment: post-procedure vital signs reviewed and stable Respiratory status: spontaneous breathing, nonlabored ventilation, respiratory function stable and patient connected to nasal cannula oxygen Cardiovascular status: blood pressure returned to baseline and stable Postop Assessment: no apparent nausea or vomiting Anesthetic complications: no   There were no known notable events for this encounter.   Last Vitals:  Vitals:   03/21/24 1345 03/21/24 1430  BP: (!) 157/102 (!) 155/100  Pulse: 75 78  Resp: 14 16  Temp: 36.5 C (!) 36.4 C  SpO2: 94% 98%    Last Pain:  Vitals:   03/21/24 1430  TempSrc: Temporal  PainSc: 4                  Lendia LITTIE Mae

## 2024-03-21 NOTE — Progress Notes (Signed)
 Pt states pain is 7/10 in bladder. Pt requesting pain medication. Dr. Chesley notified. Acknowledged. Orders received. See MAR.

## 2024-03-22 ENCOUNTER — Encounter: Payer: Self-pay | Admitting: Urology

## 2024-03-28 ENCOUNTER — Telehealth: Payer: Self-pay

## 2024-03-28 DIAGNOSIS — N3281 Overactive bladder: Secondary | ICD-10-CM

## 2024-03-28 NOTE — Telephone Encounter (Signed)
 Pt called in stating she is in a lot of pain over the last few days. Pt had cysto and ureteral dilation on 7/22. Pt previously treated for OAB but states her insurance wouldn't cover the medications for that. Patient states she is having pain the radiates down from kidney to the bladder, she has frequency and urgency and states that her urine is hot and burns her. It has a strong smell and is dark in color. She requested pain management and I stated we might need to check her for a UTI and she states she doesn't have transportation at this time and limited funds until payday. We are able to do taxi voucher if we need to get her to clinic. Do you suggest a UA or does she need a office visit since she's post op? Please advice

## 2024-03-28 NOTE — Telephone Encounter (Signed)
 Recommend urinalysis with urine culture if UA abnormal

## 2024-03-29 ENCOUNTER — Other Ambulatory Visit: Payer: Self-pay | Admitting: Internal Medicine

## 2024-03-29 ENCOUNTER — Other Ambulatory Visit

## 2024-03-29 DIAGNOSIS — M797 Fibromyalgia: Secondary | ICD-10-CM

## 2024-03-29 NOTE — Telephone Encounter (Signed)
 Tried to call and mailbox is full. Will try to call back soon to schedule UA and culture

## 2024-03-29 NOTE — Telephone Encounter (Signed)
 Patient called in ani advised that she needed to have a UA and Culture to see if she has a UTI. I was able to schedule a lab appt for her today at 3 pm and she said she will try to get here to give a sample and we will call with results after.

## 2024-03-30 ENCOUNTER — Telehealth: Payer: Self-pay

## 2024-03-30 ENCOUNTER — Other Ambulatory Visit

## 2024-03-30 ENCOUNTER — Other Ambulatory Visit: Payer: Self-pay

## 2024-03-30 DIAGNOSIS — R32 Unspecified urinary incontinence: Secondary | ICD-10-CM

## 2024-03-30 DIAGNOSIS — N3281 Overactive bladder: Secondary | ICD-10-CM

## 2024-03-30 DIAGNOSIS — R300 Dysuria: Secondary | ICD-10-CM

## 2024-03-30 LAB — URINALYSIS, COMPLETE
Bilirubin, UA: NEGATIVE
Nitrite, UA: POSITIVE — AB
RBC, UA: NEGATIVE
Specific Gravity, UA: 1.025 (ref 1.005–1.030)
Urobilinogen, Ur: 4 mg/dL — ABNORMAL HIGH (ref 0.2–1.0)
pH, UA: 5 (ref 5.0–7.5)

## 2024-03-30 LAB — MICROSCOPIC EXAMINATION: Epithelial Cells (non renal): >10 /HPF — AB (ref 0–10)

## 2024-03-30 MED ORDER — ONDANSETRON HCL 4 MG PO TABS: 4.0000 mg | ORAL_TABLET | Freq: Three times a day (TID) | ORAL | 1 refills | Status: DC | PRN

## 2024-03-30 NOTE — Telephone Encounter (Signed)
 Explained to pateint that we need to have her come in to do a UTI to see if her pain is UTI symptoms and that we can't treat her pain without knowing if She has a UTI. Patient states she will try to come in today to do the UA.

## 2024-03-30 NOTE — Telephone Encounter (Signed)
 Patient called triage line and states she can't afford $20 medication that was call by Dr. Twylla and she's in pain and would like us  to refill her pain meds. Patient was scheduled for lab yesterday to see if she has an active UTI and patient didn't come to appointment.

## 2024-03-30 NOTE — Telephone Encounter (Signed)
 Patient called back and Gemtessa is the med that insurance wont cover. She has asked can we switch the OAB med to Oxybutin which she has been on in the past. Please advise.

## 2024-03-31 ENCOUNTER — Telehealth: Payer: Self-pay | Admitting: Family

## 2024-03-31 ENCOUNTER — Telehealth: Payer: Self-pay

## 2024-03-31 ENCOUNTER — Other Ambulatory Visit: Admitting: Urology

## 2024-03-31 NOTE — Telephone Encounter (Signed)
 Patient would like lab results and possible treatment of UA she completed yesterday for us . She has had ongoing UTI symptoms with Frequency, Urgency and pain and is now having pain in her kidneys she states. UA is back and was positive for nitrates and bacteria. Culture is till pending. Can you please advise on next step?

## 2024-03-31 NOTE — Telephone Encounter (Signed)
 Returned patients call and let her know that Sam  PA states the sample appeared contaminated but had a low suspicion for UTI. Patient was requesting pain management and I let her know that If pain was severe she needed to seek attention in ER or urgent care. Pt was informed culture has been sent off of urine and we are awaiting those results.

## 2024-03-31 NOTE — Telephone Encounter (Signed)
 UA appears contaminated, low suspicion for UTI. I recommend she keep her postop follow up with Dr. Twylla or go to the Emergency Room if her pain is severe.

## 2024-03-31 NOTE — Telephone Encounter (Signed)
 Patient called on call service:  Dr. Fernand sent her to urologist, she had a cystoscopy. Having severe pain from her kidneys, she was advised to call her primary care provider, please call.  Reviewing message sent to her Urologist today, they advised going to the ED or Urgent Care if she was having that severe pain.   She did not answer when I returned call, asked that on call service advise her that I also recommend ED or UC if she is having severe pain, as she could potentially have a kidney stone or infection that would require more acute treatment.

## 2024-04-04 ENCOUNTER — Ambulatory Visit: Payer: Self-pay | Admitting: Urology

## 2024-04-04 LAB — CULTURE, URINE COMPREHENSIVE

## 2024-04-05 NOTE — Telephone Encounter (Signed)
 Patient aware of results.

## 2024-04-05 NOTE — Telephone Encounter (Signed)
-----   Message from Glendia JAYSON Barba sent at 04/04/2024  3:23 PM EDT ----- Urine culture was negative for infection ----- Message ----- From: Rebecka Memos Lab Results In Sent: 03/30/2024   4:36 PM EDT To: Glendia JAYSON Barba, MD

## 2024-04-07 MED ORDER — OXYBUTYNIN CHLORIDE ER 10 MG PO TB24
10.0000 mg | ORAL_TABLET | Freq: Every day | ORAL | 3 refills | Status: AC
Start: 2024-04-07 — End: ?

## 2024-04-07 NOTE — Addendum Note (Signed)
 Addended by: CLEOTILDE HELLER L on: 04/07/2024 08:04 AM   Modules accepted: Orders

## 2024-04-07 NOTE — Telephone Encounter (Signed)
 Sent in Rx for Oxybutynin  XL 10 mg to patient pharmacy

## 2024-04-07 NOTE — Telephone Encounter (Signed)
 Okay to send in Rx oxybutynin  XL 10 mg; 1 tab daily, #90/3

## 2024-04-10 ENCOUNTER — Ambulatory Visit: Admitting: Cardiology

## 2024-04-11 ENCOUNTER — Encounter: Payer: Self-pay | Admitting: Cardiology

## 2024-04-11 ENCOUNTER — Ambulatory Visit: Admitting: Cardiology

## 2024-04-11 VITALS — BP 180/100 | HR 89 | Ht 60.0 in | Wt 129.0 lb

## 2024-04-11 DIAGNOSIS — G8929 Other chronic pain: Secondary | ICD-10-CM | POA: Diagnosis not present

## 2024-04-11 DIAGNOSIS — M546 Pain in thoracic spine: Secondary | ICD-10-CM

## 2024-04-11 DIAGNOSIS — M25551 Pain in right hip: Secondary | ICD-10-CM

## 2024-04-11 DIAGNOSIS — I700 Atherosclerosis of aorta: Secondary | ICD-10-CM | POA: Diagnosis not present

## 2024-04-11 DIAGNOSIS — I10 Essential (primary) hypertension: Secondary | ICD-10-CM

## 2024-04-11 MED ORDER — HYDROXYZINE HCL 25 MG PO TABS: 25.0000 mg | ORAL_TABLET | Freq: Three times a day (TID) | ORAL | 0 refills | Status: DC | PRN

## 2024-04-11 MED ORDER — CETIRIZINE HCL 10 MG PO TABS: 10.0000 mg | ORAL_TABLET | Freq: Every day | ORAL | 2 refills | Status: DC

## 2024-04-11 MED ORDER — ONDANSETRON HCL 4 MG PO TABS: 4.0000 mg | ORAL_TABLET | Freq: Three times a day (TID) | ORAL | 0 refills | Status: DC | PRN
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
## 2024-04-21 ENCOUNTER — Ambulatory Visit: Admitting: Urology

## 2024-04-24 ENCOUNTER — Other Ambulatory Visit: Payer: Self-pay

## 2024-04-26 ENCOUNTER — Other Ambulatory Visit: Payer: Self-pay | Admitting: Cardiology

## 2024-04-28 ENCOUNTER — Other Ambulatory Visit: Payer: Self-pay

## 2024-04-28 ENCOUNTER — Other Ambulatory Visit: Payer: Self-pay | Admitting: Cardiology

## 2024-04-28 MED ORDER — HYDROXYZINE HCL 25 MG PO TABS: 25.0000 mg | ORAL_TABLET | Freq: Three times a day (TID) | ORAL | 0 refills | Status: DC | PRN

## 2024-05-02 ENCOUNTER — Ambulatory Visit: Admitting: Internal Medicine

## 2024-05-08 ENCOUNTER — Other Ambulatory Visit: Payer: Self-pay | Admitting: Family

## 2024-05-08 DIAGNOSIS — M797 Fibromyalgia: Secondary | ICD-10-CM

## 2024-05-11 ENCOUNTER — Other Ambulatory Visit: Payer: Self-pay

## 2024-05-11 DIAGNOSIS — M797 Fibromyalgia: Secondary | ICD-10-CM

## 2024-05-11 MED ORDER — PREGABALIN 50 MG PO CAPS: 50.0000 mg | ORAL_CAPSULE | Freq: Three times a day (TID) | ORAL | 0 refills | Status: DC

## 2024-05-17 ENCOUNTER — Other Ambulatory Visit: Payer: Self-pay | Admitting: Cardiology

## 2024-05-23 ENCOUNTER — Ambulatory Visit (INDEPENDENT_AMBULATORY_CARE_PROVIDER_SITE_OTHER): Admitting: Internal Medicine

## 2024-05-23 ENCOUNTER — Encounter: Payer: Self-pay | Admitting: Internal Medicine

## 2024-05-23 VITALS — BP 116/80 | HR 84 | Ht 60.0 in | Wt 131.2 lb

## 2024-05-23 DIAGNOSIS — K219 Gastro-esophageal reflux disease without esophagitis: Secondary | ICD-10-CM | POA: Diagnosis not present

## 2024-05-23 DIAGNOSIS — M2550 Pain in unspecified joint: Secondary | ICD-10-CM

## 2024-05-23 DIAGNOSIS — Z0130 Encounter for examination of blood pressure without abnormal findings: Secondary | ICD-10-CM

## 2024-05-23 DIAGNOSIS — E782 Mixed hyperlipidemia: Secondary | ICD-10-CM

## 2024-05-23 DIAGNOSIS — E038 Other specified hypothyroidism: Secondary | ICD-10-CM | POA: Diagnosis not present

## 2024-05-23 DIAGNOSIS — M797 Fibromyalgia: Secondary | ICD-10-CM

## 2024-05-23 DIAGNOSIS — H9113 Presbycusis, bilateral: Secondary | ICD-10-CM

## 2024-05-23 DIAGNOSIS — M4807 Spinal stenosis, lumbosacral region: Secondary | ICD-10-CM

## 2024-05-23 DIAGNOSIS — G8929 Other chronic pain: Secondary | ICD-10-CM

## 2024-05-23 DIAGNOSIS — J432 Centrilobular emphysema: Secondary | ICD-10-CM | POA: Diagnosis not present

## 2024-05-23 DIAGNOSIS — E559 Vitamin D deficiency, unspecified: Secondary | ICD-10-CM

## 2024-05-23 MED ORDER — OXYCODONE HCL 5 MG PO TABS: 5.0000 mg | ORAL_TABLET | Freq: Four times a day (QID) | ORAL | 0 refills | Status: DC | PRN
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
## 2024-05-24 ENCOUNTER — Telehealth: Payer: Self-pay

## 2024-05-24 ENCOUNTER — Other Ambulatory Visit: Payer: Self-pay

## 2024-05-24 LAB — TSH+T4F+T3FREE
Free T4: 1.11 ng/dL (ref 0.82–1.77)
T3, Free: 3.7 pg/mL (ref 2.0–4.4)
TSH: 1.83 u[IU]/mL (ref 0.450–4.500)

## 2024-05-24 LAB — CBC WITH DIFFERENTIAL/PLATELET
Basophils Absolute: 0.1 x10E3/uL (ref 0.0–0.2)
Basos: 1 %
EOS (ABSOLUTE): 0.2 x10E3/uL (ref 0.0–0.4)
Eos: 2 %
Hematocrit: 40.6 % (ref 34.0–46.6)
Hemoglobin: 12.9 g/dL (ref 11.1–15.9)
Immature Grans (Abs): 0 x10E3/uL (ref 0.0–0.1)
Immature Granulocytes: 0 %
Lymphocytes Absolute: 3.5 x10E3/uL — ABNORMAL HIGH (ref 0.7–3.1)
Lymphs: 38 %
MCH: 26.6 pg (ref 26.6–33.0)
MCHC: 31.8 g/dL (ref 31.5–35.7)
MCV: 84 fL (ref 79–97)
Monocytes Absolute: 0.6 x10E3/uL (ref 0.1–0.9)
Monocytes: 7 %
Neutrophils Absolute: 4.9 x10E3/uL (ref 1.4–7.0)
Neutrophils: 52 %
Platelets: 347 x10E3/uL (ref 150–450)
RBC: 4.85 x10E6/uL (ref 3.77–5.28)
RDW: 13.5 % (ref 11.7–15.4)
WBC: 9.2 x10E3/uL (ref 3.4–10.8)

## 2024-05-24 LAB — LIPID PANEL W/O CHOL/HDL RATIO
Cholesterol, Total: 179 mg/dL (ref 100–199)
HDL: 50 mg/dL (ref >39)
LDL Chol Calc (NIH): 114 mg/dL — ABNORMAL HIGH (ref 0–99)
Triglycerides: 78 mg/dL (ref 0–149)
VLDL Cholesterol Cal: 15 mg/dL (ref 5–40)

## 2024-05-24 LAB — CMP14+EGFR
ALT: 113 IU/L — ABNORMAL HIGH (ref 0–32)
AST: 42 IU/L — ABNORMAL HIGH (ref 0–40)
Albumin: 4.3 g/dL (ref 3.9–4.9)
Alkaline Phosphatase: 79 IU/L (ref 49–135)
BUN/Creatinine Ratio: 10 — ABNORMAL LOW (ref 12–28)
BUN: 12 mg/dL (ref 8–27)
Bilirubin Total: 0.5 mg/dL (ref 0.0–1.2)
CO2: 23 mmol/L (ref 20–29)
Calcium: 8.9 mg/dL (ref 8.7–10.3)
Chloride: 104 mmol/L (ref 96–106)
Creatinine, Ser: 1.24 mg/dL — ABNORMAL HIGH (ref 0.57–1.00)
Globulin, Total: 2.3 g/dL (ref 1.5–4.5)
Glucose: 84 mg/dL (ref 70–99)
Potassium: 4.1 mmol/L (ref 3.5–5.2)
Sodium: 142 mmol/L (ref 134–144)
Total Protein: 6.6 g/dL (ref 6.0–8.5)
eGFR: 48 mL/min/1.73 — ABNORMAL LOW (ref >59)

## 2024-05-24 MED ORDER — MAGNESIUM OXIDE -MG SUPPLEMENT 400 (240 MG) MG PO TABS: 400.0000 mg | ORAL_TABLET | Freq: Every day | ORAL | 2 refills | Status: DC

## 2024-05-24 NOTE — Telephone Encounter (Signed)
 Pt stated she received her Zyrtec  at the pharmacy. I sent in a refill request for her magnesium  but pt still needs Vitamin D3 ordered.

## 2024-05-25 ENCOUNTER — Ambulatory Visit: Payer: Self-pay | Admitting: Internal Medicine

## 2024-05-25 ENCOUNTER — Other Ambulatory Visit: Payer: Self-pay | Admitting: Internal Medicine

## 2024-05-25 ENCOUNTER — Telehealth: Payer: Self-pay | Admitting: Internal Medicine

## 2024-05-25 DIAGNOSIS — E559 Vitamin D deficiency, unspecified: Secondary | ICD-10-CM

## 2024-05-25 MED ORDER — ONDANSETRON HCL 4 MG PO TABS: 4.0000 mg | ORAL_TABLET | Freq: Three times a day (TID) | ORAL | 0 refills | Status: DC | PRN

## 2024-05-25 MED ORDER — VITAMIN D3 1.25 MG (50000 UT) PO CAPS: 1.0000 | ORAL_CAPSULE | ORAL | 3 refills | Status: DC

## 2024-05-25 NOTE — Telephone Encounter (Signed)
 Patient left VM requesting Dr. Fernand send her in something for her cough. She was in the office earlier this week and said she is tired of the rattling in her chest. Please advise.

## 2024-05-28 ENCOUNTER — Other Ambulatory Visit: Payer: Self-pay | Admitting: Internal Medicine
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
## 2024-05-29 ENCOUNTER — Ambulatory Visit: Attending: Cardiovascular Disease | Admitting: Cardiovascular Disease

## 2024-05-29 DIAGNOSIS — F17200 Nicotine dependence, unspecified, uncomplicated: Secondary | ICD-10-CM

## 2024-05-29 DIAGNOSIS — J439 Emphysema, unspecified: Secondary | ICD-10-CM

## 2024-05-29 DIAGNOSIS — I10 Essential (primary) hypertension: Secondary | ICD-10-CM

## 2024-05-29 DIAGNOSIS — I700 Atherosclerosis of aorta: Secondary | ICD-10-CM

## 2024-05-29 DIAGNOSIS — E782 Mixed hyperlipidemia: Secondary | ICD-10-CM

## 2024-06-01 DIAGNOSIS — M7061 Trochanteric bursitis, right hip: Secondary | ICD-10-CM | POA: Diagnosis not present

## 2024-06-22 ENCOUNTER — Telehealth: Payer: Self-pay

## 2024-06-22 ENCOUNTER — Other Ambulatory Visit: Payer: Self-pay | Admitting: Internal Medicine

## 2024-06-22 DIAGNOSIS — M797 Fibromyalgia: Secondary | ICD-10-CM

## 2024-06-22 MED ORDER — LINACLOTIDE 145 MCG PO CAPS: 145.0000 ug | ORAL_CAPSULE | Freq: Every day | ORAL | 2 refills | Status: DC

## 2024-06-22 NOTE — Telephone Encounter (Signed)
 I was working on the PA for the patients linzess  and the rx is written for bid, is that correct? The form says once daily, I wasn't sure if there was a specific reason for it being bid?

## 2024-06-22 NOTE — Telephone Encounter (Signed)
 Reissued for the correct amount

## 2024-06-23 ENCOUNTER — Other Ambulatory Visit: Payer: Self-pay | Admitting: Internal Medicine

## 2024-06-26 ENCOUNTER — Ambulatory Visit: Admitting: Urology

## 2024-06-29 ENCOUNTER — Other Ambulatory Visit: Payer: Self-pay | Admitting: Internal Medicine

## 2024-06-30 ENCOUNTER — Other Ambulatory Visit: Payer: Self-pay | Admitting: Student

## 2024-06-30 DIAGNOSIS — M25551 Pain in right hip: Secondary | ICD-10-CM

## 2024-06-30 DIAGNOSIS — W19XXXA Unspecified fall, initial encounter: Secondary | ICD-10-CM

## 2024-06-30 DIAGNOSIS — S76011A Strain of muscle, fascia and tendon of right hip, initial encounter: Secondary | ICD-10-CM

## 2024-07-04 ENCOUNTER — Ambulatory Visit: Admitting: Internal Medicine

## 2024-07-06 ENCOUNTER — Other Ambulatory Visit: Payer: Self-pay | Admitting: Internal Medicine

## 2024-07-06 ENCOUNTER — Ambulatory Visit
Admission: RE | Admit: 2024-07-06 | Discharge: 2024-07-06 | Disposition: A | Discharge status: Home / Self Care | Source: Ambulatory Visit | Attending: Student | Admitting: Student

## 2024-07-06 DIAGNOSIS — I700 Atherosclerosis of aorta: Secondary | ICD-10-CM | POA: Diagnosis not present

## 2024-07-06 DIAGNOSIS — M25551 Pain in right hip: Secondary | ICD-10-CM

## 2024-07-06 DIAGNOSIS — S76011A Strain of muscle, fascia and tendon of right hip, initial encounter: Secondary | ICD-10-CM | POA: Insufficient documentation

## 2024-07-06 DIAGNOSIS — M160 Bilateral primary osteoarthritis of hip: Secondary | ICD-10-CM | POA: Diagnosis not present

## 2024-07-06 DIAGNOSIS — W19XXXA Unspecified fall, initial encounter: Secondary | ICD-10-CM | POA: Diagnosis not present

## 2024-07-11 ENCOUNTER — Other Ambulatory Visit: Payer: Self-pay | Admitting: Internal Medicine

## 2024-07-12 ENCOUNTER — Telehealth: Payer: Self-pay | Admitting: Internal Medicine

## 2024-07-12 ENCOUNTER — Ambulatory Visit: Admitting: Urology

## 2024-07-12 NOTE — Telephone Encounter (Signed)
 Patient left VM requesting a referral to [facility]. Please place referral if appropriate.

## 2024-07-13 ENCOUNTER — Other Ambulatory Visit: Payer: Self-pay | Admitting: Internal Medicine

## 2024-07-13 DIAGNOSIS — M797 Fibromyalgia: Secondary | ICD-10-CM

## 2024-07-17 ENCOUNTER — Ambulatory Visit: Admitting: Internal Medicine

## 2024-07-17 ENCOUNTER — Other Ambulatory Visit: Payer: Self-pay | Admitting: Cardiology

## 2024-07-18 ENCOUNTER — Ambulatory Visit: Admitting: Family Medicine

## 2024-07-18 ENCOUNTER — Other Ambulatory Visit: Payer: Self-pay

## 2024-07-24 ENCOUNTER — Other Ambulatory Visit: Payer: Self-pay | Admitting: Cardiology

## 2024-07-31 ENCOUNTER — Other Ambulatory Visit: Payer: Self-pay | Admitting: Internal Medicine

## 2024-07-31 DIAGNOSIS — M797 Fibromyalgia: Secondary | ICD-10-CM

## 2024-08-01 ENCOUNTER — Other Ambulatory Visit: Payer: Self-pay | Admitting: Cardiology

## 2024-08-10 ENCOUNTER — Other Ambulatory Visit: Payer: Self-pay | Admitting: Internal Medicine

## 2024-08-11 MED ORDER — CETIRIZINE HCL 10 MG PO TABS: 10.0000 mg | ORAL_TABLET | Freq: Every day | ORAL | 2 refills | Status: DC

## 2024-09-16 ENCOUNTER — Other Ambulatory Visit: Payer: Self-pay | Admitting: Internal Medicine

## 2024-09-20 ENCOUNTER — Encounter: Payer: Self-pay | Admitting: Cardiology

## 2024-09-20 ENCOUNTER — Ambulatory Visit (INDEPENDENT_AMBULATORY_CARE_PROVIDER_SITE_OTHER): Admitting: Cardiology

## 2024-09-20 ENCOUNTER — Ambulatory Visit: Admitting: Cardiology

## 2024-09-20 ENCOUNTER — Other Ambulatory Visit: Payer: Self-pay

## 2024-09-20 ENCOUNTER — Ambulatory Visit: Payer: Self-pay | Admitting: Cardiology

## 2024-09-20 VITALS — BP 140/72 | HR 92 | Ht 60.0 in | Wt 121.4 lb

## 2024-09-20 DIAGNOSIS — R300 Dysuria: Secondary | ICD-10-CM | POA: Diagnosis not present

## 2024-09-20 DIAGNOSIS — I10 Essential (primary) hypertension: Secondary | ICD-10-CM | POA: Diagnosis not present

## 2024-09-20 DIAGNOSIS — M797 Fibromyalgia: Secondary | ICD-10-CM | POA: Diagnosis not present

## 2024-09-20 DIAGNOSIS — R7303 Prediabetes: Secondary | ICD-10-CM | POA: Diagnosis not present

## 2024-09-20 DIAGNOSIS — E039 Hypothyroidism, unspecified: Secondary | ICD-10-CM | POA: Diagnosis not present

## 2024-09-20 DIAGNOSIS — I700 Atherosclerosis of aorta: Secondary | ICD-10-CM | POA: Diagnosis not present

## 2024-09-20 LAB — POCT URINALYSIS DIPSTICK
Bilirubin, UA: NEGATIVE
Blood, UA: NEGATIVE
Glucose, UA: NEGATIVE
Ketones, UA: NEGATIVE
Leukocytes, UA: NEGATIVE
Nitrite, UA: NEGATIVE
Protein, UA: POSITIVE — AB
Spec Grav, UA: 1.025
Urobilinogen, UA: 0.2 U/dL
pH, UA: 6

## 2024-09-20 MED ORDER — MAGNESIUM OXIDE -MG SUPPLEMENT 400 (240 MG) MG PO TABS: 400.0000 mg | ORAL_TABLET | Freq: Every day | ORAL | 2 refills | Status: AC

## 2024-09-20 MED ORDER — PREGABALIN 50 MG PO CAPS: 50.0000 mg | ORAL_CAPSULE | Freq: Three times a day (TID) | ORAL | 0 refills | Status: AC

## 2024-09-20 MED ORDER — HYDROXYZINE HCL 25 MG PO TABS: 25.0000 mg | ORAL_TABLET | Freq: Every day | ORAL | 0 refills | Status: AC
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
## 2024-09-22 ENCOUNTER — Telehealth: Payer: Self-pay | Admitting: Internal Medicine

## 2024-09-22 ENCOUNTER — Other Ambulatory Visit: Payer: Self-pay | Admitting: Internal Medicine

## 2024-09-22 LAB — URINE CULTURE

## 2024-09-22 NOTE — Telephone Encounter (Signed)
 PT has called 4-5 times since the first message was sent stating that someone from our office called her , she was not able to answer & has been calling back ever since. States she lvm on every line. Wants to know update on seizure meds

## 2024-09-22 NOTE — Progress Notes (Signed)
 Patient notified

## 2024-09-25 ENCOUNTER — Other Ambulatory Visit: Payer: Self-pay | Admitting: Internal Medicine

## 2024-09-26 ENCOUNTER — Telehealth: Payer: Self-pay | Admitting: Internal Medicine

## 2024-09-26 ENCOUNTER — Other Ambulatory Visit: Payer: Self-pay | Admitting: Internal Medicine

## 2024-09-26 DIAGNOSIS — M797 Fibromyalgia: Secondary | ICD-10-CM

## 2024-09-26 MED ORDER — TIZANIDINE HCL 2 MG PO TABS: 2.0000 mg | ORAL_TABLET | Freq: Three times a day (TID) | ORAL | 0 refills | Status: AC

## 2024-09-26 MED ORDER — TIZANIDINE HCL 2 MG PO TABS: 2.0000 mg | ORAL_TABLET | Freq: Three times a day (TID) | ORAL | 0 refills | Status: DC

## 2024-09-26 NOTE — Telephone Encounter (Signed)
 Patient called in requesting 3 days worth of pain medication to last her until she is seen on Monday morning. She says she has a lot of pain and her ortho sent her in tizanidine  but it is at [facility] and they are closed today.   [facility] - [place] Buba

## 2024-09-27 ENCOUNTER — Emergency Department

## 2024-09-27 ENCOUNTER — Encounter: Payer: Self-pay | Admitting: *Deleted

## 2024-09-27 ENCOUNTER — Inpatient Hospital Stay
Admission: EM | Admit: 2024-09-27 | Discharge: 2024-09-29 | DRG: 683 | Disposition: A | Discharge status: Home / Self Care | Attending: Student in an Organized Health Care Education/Training Program | Admitting: Student in an Organized Health Care Education/Training Program

## 2024-09-27 ENCOUNTER — Other Ambulatory Visit: Payer: Self-pay

## 2024-09-27 DIAGNOSIS — Z8614 Personal history of Methicillin resistant Staphylococcus aureus infection: Secondary | ICD-10-CM

## 2024-09-27 DIAGNOSIS — M797 Fibromyalgia: Secondary | ICD-10-CM | POA: Diagnosis present

## 2024-09-27 DIAGNOSIS — Z888 Allergy status to other drugs, medicaments and biological substances status: Secondary | ICD-10-CM

## 2024-09-27 DIAGNOSIS — Z79890 Hormone replacement therapy (postmenopausal): Secondary | ICD-10-CM

## 2024-09-27 DIAGNOSIS — M4807 Spinal stenosis, lumbosacral region: Secondary | ICD-10-CM

## 2024-09-27 DIAGNOSIS — E860 Dehydration: Secondary | ICD-10-CM | POA: Diagnosis present

## 2024-09-27 DIAGNOSIS — R6510 Systemic inflammatory response syndrome (SIRS) of non-infectious origin without acute organ dysfunction: Secondary | ICD-10-CM

## 2024-09-27 DIAGNOSIS — Z96653 Presence of artificial knee joint, bilateral: Secondary | ICD-10-CM | POA: Diagnosis present

## 2024-09-27 DIAGNOSIS — Z886 Allergy status to analgesic agent status: Secondary | ICD-10-CM

## 2024-09-27 DIAGNOSIS — Z7983 Long term (current) use of bisphosphonates: Secondary | ICD-10-CM

## 2024-09-27 DIAGNOSIS — M549 Dorsalgia, unspecified: Secondary | ICD-10-CM

## 2024-09-27 DIAGNOSIS — Z885 Allergy status to narcotic agent status: Secondary | ICD-10-CM

## 2024-09-27 DIAGNOSIS — Z79899 Other long term (current) drug therapy: Secondary | ICD-10-CM

## 2024-09-27 DIAGNOSIS — N179 Acute kidney failure, unspecified: Principal | ICD-10-CM | POA: Diagnosis present

## 2024-09-27 DIAGNOSIS — Z981 Arthrodesis status: Secondary | ICD-10-CM

## 2024-09-27 DIAGNOSIS — E875 Hyperkalemia: Secondary | ICD-10-CM | POA: Diagnosis present

## 2024-09-27 DIAGNOSIS — E039 Hypothyroidism, unspecified: Secondary | ICD-10-CM | POA: Diagnosis present

## 2024-09-27 DIAGNOSIS — Z90710 Acquired absence of both cervix and uterus: Secondary | ICD-10-CM

## 2024-09-27 DIAGNOSIS — Z91030 Bee allergy status: Secondary | ICD-10-CM

## 2024-09-27 DIAGNOSIS — Z5919 Other inadequate housing: Secondary | ICD-10-CM

## 2024-09-27 DIAGNOSIS — J449 Chronic obstructive pulmonary disease, unspecified: Secondary | ICD-10-CM | POA: Diagnosis present

## 2024-09-27 DIAGNOSIS — Z881 Allergy status to other antibiotic agents status: Secondary | ICD-10-CM

## 2024-09-27 DIAGNOSIS — Z882 Allergy status to sulfonamides status: Secondary | ICD-10-CM

## 2024-09-27 DIAGNOSIS — N390 Urinary tract infection, site not specified: Secondary | ICD-10-CM | POA: Diagnosis present

## 2024-09-27 DIAGNOSIS — Z8249 Family history of ischemic heart disease and other diseases of the circulatory system: Secondary | ICD-10-CM

## 2024-09-27 DIAGNOSIS — Z96612 Presence of left artificial shoulder joint: Secondary | ICD-10-CM | POA: Diagnosis present

## 2024-09-27 DIAGNOSIS — Z1152 Encounter for screening for COVID-19: Secondary | ICD-10-CM

## 2024-09-27 DIAGNOSIS — F17210 Nicotine dependence, cigarettes, uncomplicated: Secondary | ICD-10-CM | POA: Diagnosis present

## 2024-09-27 DIAGNOSIS — Z96611 Presence of right artificial shoulder joint: Secondary | ICD-10-CM | POA: Diagnosis present

## 2024-09-27 DIAGNOSIS — N3281 Overactive bladder: Secondary | ICD-10-CM

## 2024-09-27 DIAGNOSIS — E8729 Other acidosis: Secondary | ICD-10-CM

## 2024-09-27 DIAGNOSIS — Z91048 Other nonmedicinal substance allergy status: Secondary | ICD-10-CM

## 2024-09-27 DIAGNOSIS — Z23 Encounter for immunization: Secondary | ICD-10-CM

## 2024-09-27 DIAGNOSIS — F419 Anxiety disorder, unspecified: Secondary | ICD-10-CM | POA: Diagnosis present

## 2024-09-27 DIAGNOSIS — E872 Acidosis, unspecified: Secondary | ICD-10-CM | POA: Diagnosis present

## 2024-09-27 DIAGNOSIS — R300 Dysuria: Principal | ICD-10-CM | POA: Diagnosis present

## 2024-09-27 DIAGNOSIS — E871 Hypo-osmolality and hyponatremia: Secondary | ICD-10-CM | POA: Diagnosis present

## 2024-09-27 DIAGNOSIS — F909 Attention-deficit hyperactivity disorder, unspecified type: Secondary | ICD-10-CM | POA: Diagnosis present

## 2024-09-27 DIAGNOSIS — I10 Essential (primary) hypertension: Secondary | ICD-10-CM | POA: Diagnosis present

## 2024-09-27 DIAGNOSIS — G894 Chronic pain syndrome: Secondary | ICD-10-CM | POA: Diagnosis present

## 2024-09-27 DIAGNOSIS — J432 Centrilobular emphysema: Secondary | ICD-10-CM

## 2024-09-27 LAB — URINALYSIS, ROUTINE W REFLEX MICROSCOPIC
Bilirubin Urine: NEGATIVE
Glucose, UA: NEGATIVE mg/dL
Hgb urine dipstick: NEGATIVE
Ketones, ur: NEGATIVE mg/dL
Leukocytes,Ua: NEGATIVE
Nitrite: POSITIVE — AB
Protein, ur: 30 mg/dL — AB
Specific Gravity, Urine: 1.023 (ref 1.005–1.030)
pH: 5 (ref 5.0–8.0)

## 2024-09-27 MED ORDER — OXYCODONE-ACETAMINOPHEN 5-325 MG PO TABS
1.0000 | ORAL_TABLET | Freq: Once | ORAL | Status: AC
  Administered 2024-09-27: 1 via ORAL
  Filled 2024-09-27: qty 1

## 2024-09-27 MED ORDER — NITROFURANTOIN MONOHYD MACRO 100 MG PO CAPS
100.0000 mg | ORAL_CAPSULE | Freq: Once | ORAL | Status: AC
  Administered 2024-09-27: 100 mg via ORAL
  Filled 2024-09-27: qty 1

## 2024-09-27 NOTE — ED Notes (Signed)
 First nurse note-pt brought in via ems from home with pain all over.  Bp elevated on the scene per ems.  Pt in wheelchair in lobby.  Alert.

## 2024-09-27 NOTE — ED Provider Notes (Signed)
 "   [facility] Emergency Department Provider Note     Event Date/Time   First MD Initiated Contact with Patient 09/27/24 2205     (approximate)   History   Generalized Body Aches   HPI  Jamie Schultz is a 64 y.o. female multiple medical problems including fibromyalgia, chronic pain, HTN, otitis, ADHD, anxiety, and kidney failure, presents to the ED for evaluation of generalized pain as well as some left flank pain.  Patient reports she feels as though she is dehydrated, and wants to be checked out.  Patient denies any fever, chills, sweats.  She reports she takes Azo daily, for chronic dysuria.     Physical Exam   Triage Vital Signs: ED Triage Vitals  Encounter Vitals Group     BP 09/27/24 2059 100/77     Girls Systolic BP Percentile --      Girls Diastolic BP Percentile --      Boys Systolic BP Percentile --      Boys Diastolic BP Percentile --      Pulse Rate 09/27/24 2059 90     Resp 09/27/24 2059 20     Temp 09/27/24 2059 99 F (37.2 C)     Temp Source 09/27/24 2059 Oral     SpO2 09/27/24 2059 90 %     Weight 09/27/24 2056 121 lb 4.1 oz (55 kg)     Height 09/27/24 2056 5' (1.524 m)     Head Circumference --      Peak Flow --      Pain Score 09/27/24 2056 10     Pain Loc --      Pain Education --      Exclude from Growth Chart --     Most recent vital signs: Vitals:   09/27/24 2059  BP: 100/77  Pulse: 90  Resp: 20  Temp: 99 F (37.2 C)  SpO2: 90%    General Awake, no distress.  NAD HEENT NCAT. PERRL. EOMI. No rhinorrhea. Mucous membranes are moist.  CV:  Good peripheral perfusion. RRR RESP:  Normal effort.  ABD:  No distention.  MSK:  AROM of all extremities   ED Results / Procedures / Treatments   Labs (all labs ordered are listed, but only abnormal results are displayed) Labs Reviewed  URINALYSIS, ROUTINE W REFLEX MICROSCOPIC - Abnormal; Notable for the following components:      Result Value   Color, Urine AMBER  (*)    APPearance CLEAR (*)    Protein, ur 30 (*)    Nitrite POSITIVE (*)    Bacteria, UA RARE (*)    All other components within normal limits  COMPREHENSIVE METABOLIC PANEL WITH GFR - Abnormal; Notable for the following components:   Sodium 133 (*)    Potassium 5.2 (*)    CO2 18 (*)    BUN 33 (*)    Creatinine, Ser 1.52 (*)    GFR, Estimated 38 (*)    Anion gap 17 (*)    All other components within normal limits  URINE CULTURE  RESP PANEL BY RT-PCR (RSV, FLU A&B, COVID)  RVPGX2  CBC     EKG   RADIOLOGY  I personally viewed and evaluated these images as part of my medical decision making, as well as reviewing the written report by the radiologist.  ED Provider Interpretation: no acute findings  DG Chest Port 1 View Result Date: 09/27/2024 CLINICAL DATA:  Cough EXAM: PORTABLE CHEST 1 VIEW COMPARISON:  12/31/2022 FINDINGS: Bilateral shoulder replacements and hardware in the cervical spine. No acute airspace disease, pleural effusion or pneumothorax. Mild diffuse chronic interstitial opacity. Normal cardiac size with aortic atherosclerosis. IMPRESSION: No active disease. Mild diffuse chronic interstitial opacity. Electronically Signed   By: Luke Bun M.D.   On: 09/27/2024 22:11    PROCEDURES:  Critical Care performed: No  Procedures   MEDICATIONS ORDERED IN ED: Medications  sodium chloride  0.9 % bolus 1,000 mL (has no administration in time range)  oxyCODONE -acetaminophen  (PERCOCET/ROXICET) 5-325 MG per tablet 1 tablet (1 tablet Oral Given 09/27/24 2346)  nitrofurantoin  (macrocrystal-monohydrate) (MACROBID ) capsule 100 mg (100 mg Oral Given 09/27/24 2354)     IMPRESSION / MDM / ASSESSMENT AND PLAN / ED COURSE  I reviewed the triage vital signs and the nursing notes.                              Differential diagnosis includes, but is not limited to, ovarian cyst, ovarian torsion, acute appendicitis, diverticulitis, urinary tract infection/pyelonephritis,  endometriosis, bowel obstruction, colitis, renal colic, gastroenteritis, hernia, fibroids, nephrolithiasis, colitis, dehydration, electrolyte derangement etc.   Patient's presentation is most consistent with acute complicated illness / injury requiring diagnostic workup.  Patient's diagnosis is consistent with AKI and likely cystitis. Patient will be treated empirically for UTI.  Plan at this time is to await pending CT imaging and remaining labs, and admit the patient to the hospital service.  Patient is agreeable to the plan at this time.  Care transferred to my attending at the time of this update.  Clinical Course as of 09/28/24 0055  Thu Sep 28, 2024  0054 Patient with CBC noting mild hyponatremia, and AKI with an anion gap.  Unclear etiology at this time.  Patient urine also showing some nitrites. [JM]  0054 We will attempt to get an IV established to provide patient with IV hydration and pain medicine as needed. [JM]  [phone] Patient is agreeable to the plan of admission at this time.  Hospitalist has been consulted, but will await pending CT scanner remaining labs. [JM]    Clinical Course User Index [JM] Jesly Hartmann, Candida LULLA Kings, PA-C    FINAL CLINICAL IMPRESSION(S) / ED DIAGNOSES   Final diagnoses:  Dysuria  AKI (acute kidney injury)     Rx / DC Orders   ED Discharge Orders     None        Note:  This document was prepared using Dragon voice recognition software and may include unintentional dictation errors.    Loyd Candida LULLA Kings, PA-C 09/28/24 0056    Cyrena Mylar, MD 09/28/24 [phone]  "

## 2024-09-27 NOTE — ED Notes (Signed)
"  Lab called for labs   "

## 2024-09-27 NOTE — ED Triage Notes (Addendum)
 Pt to triage via wheelchair.  Pt brought in via ems from home.  Ems reports pt hurting all over.  Pt reports body hurts all over and just wants to be checked.  Pt alert.

## 2024-09-28 ENCOUNTER — Emergency Department

## 2024-09-28 DIAGNOSIS — E875 Hyperkalemia: Secondary | ICD-10-CM

## 2024-09-28 DIAGNOSIS — N3000 Acute cystitis without hematuria: Secondary | ICD-10-CM | POA: Diagnosis not present

## 2024-09-28 DIAGNOSIS — E8729 Other acidosis: Secondary | ICD-10-CM

## 2024-09-28 DIAGNOSIS — N390 Urinary tract infection, site not specified: Secondary | ICD-10-CM

## 2024-09-28 DIAGNOSIS — R6510 Systemic inflammatory response syndrome (SIRS) of non-infectious origin without acute organ dysfunction: Secondary | ICD-10-CM

## 2024-09-28 DIAGNOSIS — N179 Acute kidney failure, unspecified: Secondary | ICD-10-CM | POA: Diagnosis present

## 2024-09-28 LAB — BASIC METABOLIC PANEL WITH GFR
Anion gap: 10 (ref 5–15)
BUN: 32 mg/dL — ABNORMAL HIGH (ref 8–23)
CO2: 24 mmol/L (ref 22–32)
Calcium: 9 mg/dL (ref 8.9–10.3)
Chloride: 104 mmol/L (ref 98–111)
Creatinine, Ser: 1.27 mg/dL — ABNORMAL HIGH (ref 0.44–1.00)
GFR, Estimated: 47 mL/min — ABNORMAL LOW
Glucose, Bld: 133 mg/dL — ABNORMAL HIGH (ref 70–99)
Potassium: 4 mmol/L (ref 3.5–5.1)
Sodium: 137 mmol/L (ref 135–145)

## 2024-09-28 LAB — COMPREHENSIVE METABOLIC PANEL WITH GFR
ALT: 15 U/L (ref 0–44)
AST: 38 U/L (ref 15–41)
Albumin: 4.2 g/dL (ref 3.5–5.0)
Alkaline Phosphatase: 87 U/L (ref 38–126)
Anion gap: 17 — ABNORMAL HIGH (ref 5–15)
BUN: 33 mg/dL — ABNORMAL HIGH (ref 8–23)
CO2: 18 mmol/L — ABNORMAL LOW (ref 22–32)
Calcium: 9.9 mg/dL (ref 8.9–10.3)
Chloride: 99 mmol/L (ref 98–111)
Creatinine, Ser: 1.52 mg/dL — ABNORMAL HIGH (ref 0.44–1.00)
GFR, Estimated: 38 mL/min — ABNORMAL LOW
Glucose, Bld: 91 mg/dL (ref 70–99)
Potassium: 5.2 mmol/L — ABNORMAL HIGH (ref 3.5–5.1)
Sodium: 133 mmol/L — ABNORMAL LOW (ref 135–145)
Total Bilirubin: 0.3 mg/dL (ref 0.0–1.2)
Total Protein: 7.6 g/dL (ref 6.5–8.1)

## 2024-09-28 LAB — CBC
HCT: 40.6 % (ref 36.0–46.0)
HCT: 37.8 % (ref 36.0–46.0)
Hemoglobin: 13.2 g/dL (ref 12.0–15.0)
Hemoglobin: 12.3 g/dL (ref 12.0–15.0)
MCH: 29.3 pg (ref 26.0–34.0)
MCH: 29.8 pg (ref 26.0–34.0)
MCHC: 32.5 g/dL (ref 30.0–36.0)
MCHC: 32.5 g/dL (ref 30.0–36.0)
MCV: 90.2 fL (ref 80.0–100.0)
MCV: 91.5 fL (ref 80.0–100.0)
Platelets: 439 10*3/uL — ABNORMAL HIGH (ref 150–400)
Platelets: 390 10*3/uL (ref 150–400)
RBC: 4.5 MIL/uL (ref 3.87–5.11)
RBC: 4.13 MIL/uL (ref 3.87–5.11)
RDW: 15.3 % (ref 11.5–15.5)
RDW: 15.2 % (ref 11.5–15.5)
WBC: 11.4 10*3/uL — ABNORMAL HIGH (ref 4.0–10.5)
WBC: 10 10*3/uL (ref 4.0–10.5)
nRBC: 0 % (ref 0.0–0.2)
nRBC: 0 % (ref 0.0–0.2)

## 2024-09-28 LAB — MRSA NEXT GEN BY PCR, NASAL: MRSA by PCR Next Gen: NOT DETECTED

## 2024-09-28 LAB — RESP PANEL BY RT-PCR (RSV, FLU A&B, COVID)  RVPGX2
Influenza A by PCR: NEGATIVE
Influenza B by PCR: NEGATIVE
Resp Syncytial Virus by PCR: NEGATIVE
SARS Coronavirus 2 by RT PCR: NEGATIVE

## 2024-09-28 MED ORDER — PREGABALIN 50 MG PO CAPS
50.0000 mg | ORAL_CAPSULE | Freq: Three times a day (TID) | ORAL | Status: DC
  Administered 2024-09-28 – 2024-09-29 (×4): 50 mg via ORAL
  Filled 2024-09-28 (×4): qty 1

## 2024-09-28 MED ORDER — IPRATROPIUM-ALBUTEROL 0.5-2.5 (3) MG/3ML IN SOLN
3.0000 mL | Freq: Once | RESPIRATORY_TRACT | Status: AC
  Administered 2024-09-28: 3 mL via RESPIRATORY_TRACT
  Filled 2024-09-28: qty 3

## 2024-09-28 MED ORDER — SODIUM CHLORIDE 0.9 % IV BOLUS
1000.0000 mL | Freq: Once | INTRAVENOUS | Status: AC
  Administered 2024-09-28: 1000 mL via INTRAVENOUS

## 2024-09-28 MED ORDER — HYDROXYZINE HCL 25 MG PO TABS
25.0000 mg | ORAL_TABLET | Freq: Three times a day (TID) | ORAL | Status: DC | PRN
  Filled 2024-09-28: qty 1

## 2024-09-28 MED ORDER — ACETAMINOPHEN 650 MG RE SUPP: 650.0000 mg | Freq: Four times a day (QID) | RECTAL | Status: DC | PRN

## 2024-09-28 MED ORDER — IPRATROPIUM-ALBUTEROL 0.5-2.5 (3) MG/3ML IN SOLN
3.0000 mL | Freq: Four times a day (QID) | RESPIRATORY_TRACT | Status: DC
  Administered 2024-09-28: 3 mL via RESPIRATORY_TRACT
  Filled 2024-09-28 (×2): qty 3

## 2024-09-28 MED ORDER — ACETAMINOPHEN 325 MG PO TABS: 650.0000 mg | ORAL_TABLET | Freq: Four times a day (QID) | ORAL | Status: DC | PRN

## 2024-09-28 MED ORDER — LINACLOTIDE 145 MCG PO CAPS
145.0000 ug | ORAL_CAPSULE | Freq: Every day | ORAL | Status: DC
  Administered 2024-09-29: 145 ug via ORAL
  Filled 2024-09-28: qty 1

## 2024-09-28 MED ORDER — SODIUM CHLORIDE 0.9 % IV SOLN
1.0000 g | INTRAVENOUS | Status: DC
  Filled 2024-09-28: qty 10

## 2024-09-28 MED ORDER — ENOXAPARIN SODIUM 30 MG/0.3ML IJ SOSY
30.0000 mg | PREFILLED_SYRINGE | INTRAMUSCULAR | Status: DC
  Filled 2024-09-28: qty 0.3

## 2024-09-28 MED ORDER — ONDANSETRON HCL 4 MG/2ML IJ SOLN
4.0000 mg | Freq: Four times a day (QID) | INTRAMUSCULAR | Status: DC | PRN
  Filled 2024-09-28: qty 2

## 2024-09-28 MED ORDER — OXYCODONE HCL 5 MG PO TABS
5.0000 mg | ORAL_TABLET | ORAL | Status: DC | PRN
  Administered 2024-09-28 – 2024-09-29 (×3): 5 mg via ORAL
  Filled 2024-09-28 (×4): qty 1

## 2024-09-28 MED ORDER — MORPHINE SULFATE (PF) 2 MG/ML IV SOLN
2.0000 mg | INTRAVENOUS | Status: DC | PRN
  Administered 2024-09-28 – 2024-09-29 (×4): 2 mg via INTRAVENOUS
  Filled 2024-09-28 (×5): qty 1

## 2024-09-28 MED ORDER — QUETIAPINE FUMARATE 200 MG PO TABS
400.0000 mg | ORAL_TABLET | Freq: Every day | ORAL | Status: DC
  Administered 2024-09-28: 400 mg via ORAL
  Filled 2024-09-28 (×2): qty 2

## 2024-09-28 MED ORDER — OXYBUTYNIN CHLORIDE ER 5 MG PO TB24
10.0000 mg | ORAL_TABLET | Freq: Every day | ORAL | Status: DC
  Administered 2024-09-28 – 2024-09-29 (×2): 10 mg via ORAL
  Filled 2024-09-28: qty 1
  Filled 2024-09-28: qty 2

## 2024-09-28 MED ORDER — AMPHETAMINE-DEXTROAMPHETAMINE 5 MG PO TABS
15.0000 mg | ORAL_TABLET | Freq: Two times a day (BID) | ORAL | Status: DC
  Administered 2024-09-28: 15 mg via ORAL

## 2024-09-28 MED ORDER — ALPRAZOLAM 1 MG PO TABS
1.0000 mg | ORAL_TABLET | Freq: Three times a day (TID) | ORAL | Status: DC
  Administered 2024-09-28 – 2024-09-29 (×3): 1 mg via ORAL
  Filled 2024-09-28 (×3): qty 1

## 2024-09-28 MED ORDER — INFLUENZA VAC SPLIT HIGH-DOSE 0.5 ML IM SUSY
0.5000 mL | PREFILLED_SYRINGE | INTRAMUSCULAR | Status: AC
  Administered 2024-09-29: 0.5 mL via INTRAMUSCULAR
  Filled 2024-09-28: qty 0.5

## 2024-09-28 MED ORDER — SODIUM CHLORIDE 0.9 % IV SOLN: INTRAVENOUS | Status: AC

## 2024-09-28 MED ORDER — TIZANIDINE HCL 4 MG PO TABS
2.0000 mg | ORAL_TABLET | Freq: Three times a day (TID) | ORAL | Status: DC
  Administered 2024-09-28 – 2024-09-29 (×4): 2 mg via ORAL
  Filled 2024-09-28 (×4): qty 1

## 2024-09-28 MED ORDER — ENSURE PLUS HIGH PROTEIN PO LIQD
237.0000 mL | Freq: Two times a day (BID) | ORAL | Status: DC
  Administered 2024-09-28 – 2024-09-29 (×3): 237 mL via ORAL

## 2024-09-28 MED ORDER — LEVOTHYROXINE SODIUM 88 MCG PO TABS
88.0000 ug | ORAL_TABLET | Freq: Every day | ORAL | Status: DC
  Administered 2024-09-28 – 2024-09-29 (×2): 88 ug via ORAL
  Filled 2024-09-28 (×3): qty 1

## 2024-09-28 MED ORDER — MAGNESIUM OXIDE -MG SUPPLEMENT 400 (240 MG) MG PO TABS
400.0000 mg | ORAL_TABLET | Freq: Every day | ORAL | Status: DC
  Administered 2024-09-28 – 2024-09-29 (×2): 400 mg via ORAL
  Filled 2024-09-28 (×2): qty 1

## 2024-09-28 MED ORDER — SODIUM ZIRCONIUM CYCLOSILICATE 10 G PO PACK
10.0000 g | PACK | Freq: Once | ORAL | Status: AC
  Administered 2024-09-28: 10 g via ORAL
  Filled 2024-09-28: qty 1

## 2024-09-28 MED ORDER — INFLUENZA VAC SPLIT HIGH-DOSE 0.5 ML IM SUSY: 0.5000 mL | PREFILLED_SYRINGE | INTRAMUSCULAR | Status: DC

## 2024-09-28 MED ORDER — ONDANSETRON HCL 4 MG PO TABS: 4.0000 mg | ORAL_TABLET | Freq: Four times a day (QID) | ORAL | Status: DC | PRN

## 2024-09-28 MED ORDER — ALBUTEROL SULFATE (2.5 MG/3ML) 0.083% IN NEBU: 2.5000 mg | INHALATION_SOLUTION | RESPIRATORY_TRACT | Status: DC | PRN

## 2024-09-28 MED ORDER — ENOXAPARIN SODIUM 40 MG/0.4ML IJ SOSY
40.0000 mg | PREFILLED_SYRINGE | INTRAMUSCULAR | Status: DC
  Administered 2024-09-28: 40 mg via SUBCUTANEOUS
  Filled 2024-09-28: qty 0.4

## 2024-09-28 MED ORDER — ALPRAZOLAM 1 MG PO TABS
1.0000 mg | ORAL_TABLET | Freq: Four times a day (QID) | ORAL | Status: DC
  Administered 2024-09-28: 1 mg via ORAL
  Filled 2024-09-28: qty 2

## 2024-09-28 MED ORDER — NITROFURANTOIN MONOHYD MACRO 100 MG PO CAPS: 100.0000 mg | ORAL_CAPSULE | Freq: Two times a day (BID) | ORAL | Status: DC

## 2024-09-28 MED ORDER — SODIUM CHLORIDE 0.9 % IV SOLN
2.0000 g | Freq: Once | INTRAVENOUS | Status: AC
  Administered 2024-09-28: 2 g via INTRAVENOUS
  Filled 2024-09-28: qty 20

## 2024-09-28 NOTE — Assessment & Plan Note (Signed)
 Fibromyalgia History of multiple orthopedic procedures including laminectomy On chronic opiates Continue oxycodone , pregabalin  and tizanidine 

## 2024-09-28 NOTE — Plan of Care (Addendum)

## 2024-09-28 NOTE — Assessment & Plan Note (Signed)
 Not acutely exacerbated DuoNebs as needed

## 2024-09-28 NOTE — Progress Notes (Signed)
" °  INTERVAL PROGRESS NOTE Same day as admission note.  Jamie Schultz- 64 y.o. female  LOS: 0 __________________________________________________________________  SUBJECTIVE: Admitted 09/27/2024 with cc of  Chief Complaint  Patient presents with   Generalized Body Aches   Since admission, continues to have pain which is improving. Renal function improving.   OBJECTIVE: Blood pressure 125/71, pulse 87, temperature 98.3 F (36.8 C), temperature source Oral, resp. rate 18, height 5' (1.524 m), weight 55 kg, SpO2 95%.  General: restless. Appears older than stated age Cardiac: RRR, normal heart sounds, no murmurs. 2+ radial and PT pulses bilaterally Respiratory: CTAB, normal effort, No wheezes, rales or rhonchi Abdomen: soft, nontender, nondistended, no hepatic or splenomegaly, +BS Extremities: no edema. WWP. Skin: warm and dry, no rashes noted Neuro: alert and oriented, no focal deficits Psych: at times tearful.   ASSESSMENT/PLAN:  I have reviewed the full H&P by Dr. Cleatus, and I reviewed pertinent results. In addition: Restarting home anxiety medications.  Continue IVF and continue to monitor AKI.  Continue Abx for UTI which is changed to CTX.   Of note, patient's husband admits to bedbug infestation at home. Appropriate decontamination ordered.   Principal Problem:   Urinary tract infection Active Problems:   SIRS (systemic inflammatory response syndrome) (HCC)   AKI (acute kidney injury)   High anion gap metabolic acidosis   Hyperkalemia   COPD (chronic obstructive pulmonary disease) (HCC)   Chronic musculoskeletal pain   Anxiety   Fibromyalgia   Essential hypertension, benign   Dysuria   Jamie LITTIE Piety, DO [facility] 09/28/2024, 7:47 AM    [email or web address] Available by Epic secure chat 7AM-7PM. If 7PM-7AM, please contact night-coverage   No Charge   "

## 2024-09-28 NOTE — Progress Notes (Signed)
 Pt is not able to stay awake longer than to answer 1-2 questions. It was explained that this nurse would like the pt to wake up more before administering more pain medications. Pt is agreeable to this.

## 2024-09-28 NOTE — Discharge Instructions (Addendum)
 Your kidney function has returned to normal.  Your urinary tract infection treatment is completed. I have prescribed pain medications for a few days. Please follow up with your PCP if you need further treatment.    Food Resources  Agency Name: [facility] Agency Address: [address] [place], [place] [address] Phone: [phone] Website: [email or web address] Service(s) Offered: Housing services, self-sufficiency, congregate meal program, weatherization program, event organiser program, emergency food assistance,  housing counseling, home ownership program, wheels - to work program.  [employer] free for 60 and older at various locations from usaa, Monday-Friday:  [employer], [address] [place], [phone] -[facility], [address] [place] [phone]  -[facility], [address] [place] [phone]  -[address], [place], [phone]  Agency Name: [facility] Address: [phone] [address], [place], [place] [address] Phone: [phone] Website: [email or web address] Service(s) Offered: Home delivered hot, frozen, and emergency  meals. Grocery assistance program which matches  volunteers one-on-one with seniors unable to grocery shop  for themselves. Must be 60 years and older; less than 20  hours of in-home aide service, limited or no driving ability;  live alone or with someone with a disability; live in  [place].  Agency Name: [facility]) Address: [address] [place], [place] [address] Phone: [phone] Service(s) Offered: Food is served to shut-ins, homeless, elderly, and low income people in the community every Saturday (11:30 am-12:30 pm) and Sunday (12:30 pm-1:30pm). Volunteers also offer help and encouragement in seeking employment,  and spiritual guidance.  Agency Name: [facility] of Social  Services Address: [address] [place], [place] [address] Phone: [phone] Service(s) Offered: Child support services; child welfare services; food stamps; Medicaid; work first family assistance; and aid with fuel,  rent, food and medicine.  Agency Name: Dietitian Address: [address], [place], [place] Phone: [phone] Website: [email or web address] Services Offered: Monday 10:00am-12:00, 8:00pm-9:00pm, and Friday 10:00am-12:00.  Agency Name: [facility] Address: [address] [place], [place] [address] Phone: [phone] Website: www.[email or web address] Service(s) Offered: Serves weekday meals, open from 11:30 am- 1:00 pm., and 6:30-7:30pm, Monday-Wednesday-Friday distributes food 3:30-6pm, Monday-Wednesday-Friday.  Agency Name: [facility] Address: [address] [place], [place] Phone: [phone] Website: [email or web address] Services Offered: Distributes food the 4th Saturday of the month, starting at 8:00 am  Agency Name: [facility] Address: [phone] [address], [place], [place] [address] Phone: [phone] Website: [email or web address] Service(s) Offered: Bread of life, weekly food pantry. Open Wednesdays from 10:00am-noon.  Agency Name: [facility] [employer] Address: [address] [place], [place], [place] Phone: [phone] Services Offered: Distributes food 9am-1pm, Monday-Thursday. Call for details.  Agency Name: [facility] Address: [address] [place], [place] [address] Phone: [phone] Website: [email or web address] Service(s) Offered: Games Developer. Call for assistance.  Agency Name: [facility] Address: [address] [place], [place] [address] Phone: [phone] Service Offered: Emergency Food Pantry. Call for appointment.  Agency Name: [facility] Address: [address] [place], [place] [address] Phone: [phone] Website: [email or web address] Services Offered: Games Developer. Call for details  Agency Name: [facility] Address: [address] [place], [place] Phone: [phone] Website: [email or web address] Service(s) Offered: Emergency Food Pantry. Call for details.  Agency Name: Holiday Representative Address: [address], [place], [place] [address] Phone: [phone] or [phone] Website: [email or web address] Service(s) Offered: Distribute food 9am-11:30 am, Tuesday-Friday, and  1-3:30pm, Monday-Friday. Food pantry Monday-Friday 1pm-3pm, fresh items, Mon.-Wed.-Fri.  Agency Name: [facility] Address: [address] [place], [place] [address] Phone: [phone] Website: [email or web address] Services Offered: Distribute food Tues and Sats from 9:00am-noon. Closed 1st Saturday of each month. Call for details  Agency Name: [employer] Address: Fayrene Boatman [facility] [address] [place] [address] Phone: [phone]  Services Offered: Delivers meals every Thursday   Rent/Utility/Housing  Agency Name: [facility] Agency Address: [address] [place], [place] [address] Phone: [phone] Email: [email or web address] Website: [email or web address] Service(s) Offered: Housing services, self-sufficiency, congregate meal program, weatherization program, field seismologist program, emergency food assistance,  housing counseling, home ownership program, wheels -towork program.  Agency Name: [facility] Address: [address], [place], [place] [address] Phone: [phone] (8a-4p) [phone] (8p- 10p) Email: [email or web address] Website: [email or web address] Service(s) Offered: A program for homeless and/or needy men that includes one-on-one counseling, life skills training and  job rehabilitation.  Agency Name: [facility] [place] Address: [address], [place], [place] [address] Phone: [phone] Website: www.[email or web address] Service(s) Offered: Assistance to needy in emergency with utility bills, heating fuel, and prescriptions. Shelter for homeless 7pm-7am. December 24, 2016 15  Agency Name: [facility] [place] (Developmentally Disabled) Address: [address], [place], [place] [address] Phone: [phone] Contact Person: Lemond Cart Email: [email or web address] Website: [email or web address] Service(s) Offered: Helps individuals with developmental disabilities move from housing that is more restrictive to homes where they  can achieve greater independence and have more  opportunities.  Agency Name: [facility] Address: [address], [place], [place] [address] Phone: [phone] Email: [email or web address] Website: www.[email or web address].org Service(s) Offered: Provides affordable housing for low-income families, elderly, and disabled individuals. Offer a wide range of  programs and services, from financial planning to afterschool and summer programs.  Agency Name: Department of Social Services Address: [address] [place], [place] [address] Phone: [phone] Service(s) Offered: Child support services; child welfare services; food stamps; Medicaid; work first family assistance; and aid with fuel,  rent, food and medicine.  Agency Name: [facility] [place] [employer]. Address: [facility] [address], [place], [place]  [address] Phone: [phone] Website: www.familyabuseservices.org Service(s) Offered: 24 hour Crisis Line: [phone]; 24 hour Emergency Shelter; Transitional Housing; Support Groups; Scientist, Physiological; [employer]; Hispanic Outreach: [phone];  Visitation Center: [phone].  Agency Name: [facility] [place]. Address: [address] [place], [place] [address] Phone: [phone] Service(s) Offered: CAP Services; Home and [employer]; Individual or Group Supports; Respite Care Non-Institutional Nursing;  Residential Supports; Respite Care and Personal Care Services; Transportation; Family and Friends Night; Recreational Activities; Three Nutritious Meals/Snacks; Consultation with Registered Dietician; Twenty-four hour Registered Nurse Access; Daily and [employer]; Camp Green Leaves; Hosmer for the [employer] (During Summer Months) Bingo Night (Every  Wednesday Night); Special Populations Dance Night  (Every Tuesday Night); Professional Hair Care Services.  Agency Name: [facility] Address: [address], [place], [place] [address] Phone: [phone] Contact Person: [place] Website: [email or web address], Medical) Offered: Residential treatment facility for women; food and  clothing, educational & employment development and  transportation to work; counsellor of financial skills;  parenting and family reunification; emotional and spiritual  support; transitional housing for program graduates.  Agency Name: [facility] Address: [address], [place], [place] [address] Phone: [phone] Email: [email or web address] Website: [email or web address] Service(s) Offered: Public housing units for elderly, disabled, and low income people; housing  choice vouchers for income eligible  applicants; shelter plus care vouchers; and HOPWA voucher program.  Agency Name: [facility] [place] Address: [address], [place], [place] [address] Phone: [phone] Email: [email or web address] Website: www.habitatalamance.org Service(s) Offered: Build houses for families in need of decent housing. Each adult in the family must invest 200 hours of labor on  someone elses house, work with volunteers to build their own house, attend  classes on budgeting, home maintenance, yard care, and attend homeowner association meetings.  Agency Name: [facility] Address: [address] [place], [place] [address] Phone: [phone] Website: www.rsli.org Service(s) Offered: Intermediate care facilities for intellectually delayed, Supervised Living in group homes for adults with developmental disabilities, Supervised Living for people who have dual diagnoses (MRMI), Independent Living, Supported Living, respite and a variety of CAP services, pre-vocational services, day supports, and [employer].  Agency Name: [place] [facility] Phone: [phone] Website: www.NCForeclosurePrevention.gov Service(s) Offered: Zero-interest, deferred loans to homeowners struggling to pay their mortgage. Call for more information.   Shelters Resource List  [facility] PROVIDED BY: [facility] [address] [place], [place], [place] Offers a faith-based shelter for homeless men, usually with substance use disorders. Residents receive counseling, life skills training, and help finding a job.  HOMELESS SHELTER PROVIDED BY: ALLIED CHURCHES OF [facility] [address] [place], [place], [place] Offers a shelter for men, women, and families experiencing homelessness. Food, clothing and other items are available for residents. Also offers support and services to help residents become self-sufficient. Offers temporary emergency housing for 30 days. Additional shelter may be available when temperatures drop below freezing but is not guaranteed.  FAMILY ABUSE SERVICES OF [facility] COUNTY PROVIDED BY: FAMILY ABUSE SERVICES OF [facility] [address], [place], [place] Offers services for victims of domestic violence. Offers a 24-hour crisis line and emergency shelter. Offers information and referrals to other community resources. Also offers court advocacy and support  groups.  HOUSING CHOICE VOUCHER PROGRAM PROVIDED BY: [facility]OUSING AUTHORITY - [place] [address], [place], [place] Offers vouchers for approved Section 8 properties. Vouchers offer financial help with rent    FRUIT TREE MINISTRIES PROVIDED BY: [facility], [place], [place] Offers emergency shelter for victims of domestic violence. Also offers a 24-hour crisis hotline for victims of domestic violence, safety planning, information and referrals, case management, and support groups for victims of domestic violence.   ACT TOGETHER EMERGENCY SHELTER PROVIDED BY: [facility] [address], [place], [place] Offers a 21-day emergency shelter for youth experiencing a family crisis, abuse, or homelessness. Case management, supportive services, healthcare services, and more are available for residents. SHELTER PROVIDED BY: [facility] [address], [place], [place] Offers a homeless shelter for people and families. Meals, showers, community referrals, case management, and more are available for residents. HEARTH TRANSITIONAL LIVING PROGRAM PROVIDED BY: [facility] [address], [place], [place] Offers an 55-month homeless shelter for younger adults experiencing homelessness. Case management, independent living skills education, and more are available for residents. PARTNERSHIP VILLAGE PROVIDED BY: [place] [facility] [address], [place], [place] Offers transitional housing for families and single people experiencing homelessness. Residents meet regularly with a case manager to work towards self-sufficiency TRANSITIONAL HOUSING PROVIDED BY: [facility] [address], [place], [place] Offers transitional housing for female veterans with disabilities. Residents receive meals, transportation, and clothing. Also offers support groups, nutrition classes, and peer support to residents.   WEAVER HOUSE PROVIDED BY: RUTHELLEN LUIS  MINISTRY [address], [place], [place] Offers shelter to adult men and women. Guests receive hot meals and case management. Also offers overnight shelter when temperatures drop during cold winter months  EMERGENCY FAMILY SHELTER PROVIDED BY: [facility] [address], [place], [place] Offers shelter and support services for families experiencing homelessness.     Transportation Resources for [employer]  Agency Name: [facility] Agency Address: [address] [place], [place] [address] Phone: [phone] Email: [email or web address] Website: www.[email or web address].org Service(s) Offered: Housing services, self-sufficiency, congregate meal program, weatherization program, field seismologist program, emergency food assistance,  housing counseling, home ownership program, wheels-towork program.  Agency Name: [facility] [employer] [phone]) Address: [address], [place], [place] [address] Phone: [phone] Website: [email or web address].acta-[email or web address].[email or web address] Service(s) Offered: Transportation for bluelinx, subscription and demand response; Dial-a-Ride for citizens 51 years of age or older.  Agency Name: Department of Social Services Address: [address] [place], [place] [address] Phone: [phone] Service(s) Offered: Child support services; child welfare services; food stamps; Medicaid; work first family assistance; and aid with fuel,  rent, food and medicine, transportation assistance.  Agency Name: Disabled [employer] (DAV) Transportation  Network Phone: [phone] Service(s) Offered: Transports veterans to the [facility] medical center. Call  forty-eight hours in advance and leave the name, telephone  number, date, and time of appointment. Veteran will be  contacted by the driver the day before the appointment to  arrange a pick up point    [employer] ACTA  currently provides door to door services. ACTA connects with PART daily for services to [facility]. ACTA also performs contract services to [employer] operates 27 vehicles, all but 3 mini-vans are equipped with lifts for special needs as well as the general public. ACTA drivers are each CDL certified and trained in First Aid and CPR. ACTA was established in 2002 by [employer]. An independent Industrial/product Designer. ACTA operates via cytogeneticist with required local 10% match funding from Drain. ACTA provides over 80,000 passenger trips each year, including [facility] and [employer] sites.  Call at least by 11 AM one business day prior to needing transportation  [employer].                      [place], [place] [address]     Office Hours: Monday-Friday  8 AM - 5 PM   Agency Name: [facility] Address: [address], [place], [place] [address] Phone: [phone] Website: www.[email or web address].org Service(s) Offered: Housing services, self-sufficiency, congregate meal program, and individual development account program.  Agency Name: [facility] [place] Address: [address], [place], [place] [address] Phone: [phone] Email: [email or web address] Website: www.alliedchurches.org Service(s) Offered: Housing the homeless, feeding the hungry, company secretary, job and education related services.  Agency Name: [facility] Address: [address], [place], [place] [address] Phone: [phone] Email: [email or web address] Service(s) Offered: Counseling, problem pregnancy, advocacy for Hispanics, limited emergency financial assistance.  Agency Name: Department of Social Services Address: [address] [place], [place] [address] Phone: [phone] Website: [email or web address] Service(s) Offered:  Child support services; child welfare services; SNAP; Medicaid; work first family assistance; and aid with fuel,  rent, food and medicine.  Agency Name: Holiday Representative Address: [address], [place], [place] [address] Phone: [phone] or [phone] Email: [email or web address] Service(s) Offered: Family  services and transient assistance; emergency food, fuel, clothing, limited furniture, utilities; budget counseling, general counseling; give a kid a coat; thrift store; Christmas food and toys. Utility assistance, food pantry, rental  assistance, life sustaining medicine

## 2024-09-28 NOTE — TOC CM/SW Note (Signed)
 Transition of Care [facility]) - Inpatient Brief Assessment   Patient Details  Name: ALYRIC PARKIN MRN: [id number] Date of Birth: [birth year]  Transition of Care [facility]) CM/SW Contact:    Daved JONETTA Hamilton, RN Phone Number: 09/28/2024, 3:48 PM   Clinical Narrative:   Transition of Care [facility]) Screening Note   Patient Details  Name: BRYANNE RIQUELME Date of Birth: [birth year]   Transition of [facility]) CM/SW Contact:    Daved JONETTA Hamilton, RN Phone Number: 09/28/2024, 3:48 PM  Food, housing, shelter, transportation, and utility resources added to AVS.   Transition of Care Department [facility]) has reviewed patient and no TOC needs have been identified at this time. If new patient transition needs arise, please place a TOC consult.    Transition of Care Asessment: Insurance and Status: Insurance coverage has been reviewed Patient has primary care physician: Yes Home environment has been reviewed: patient lives at home with spouse Prior level of function:: Independent Prior/Current Home Services: No current home services Social Drivers of Health Review: SDOH reviewed interventions complete (Resources for food, housing, shelter, transportation, and utilities added to AVS) Readmission risk has been reviewed: Yes (13% Green) Transition of care needs: no transition of care needs at this time

## 2024-09-28 NOTE — Assessment & Plan Note (Addendum)
 High anion gap metabolic acidosis Creatinine 1.52, up from baseline of 0.71 about 6 months ago with bicarb 18 anion gap 17 Suspect secondary to dehydration, decreased oral intake IV hydration and monitor Avoid nephrotoxins
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
## 2024-09-28 NOTE — Assessment & Plan Note (Signed)
 Potassium 5.2 Lokelma  x 1

## 2024-09-28 NOTE — Assessment & Plan Note (Addendum)
 SIRS Chronic dysuria on daily Azo SIRS criteria include low-grade temp, heart rate of 90, leukocytosis CT abdomen and pelvis showed a 2 mm calcification bilateral kidneys-vascular calcifications versus nonobstructive nephrolithiasis IV hydration Continue Macrobid -history of anaphylaxis with penicillins Follow cultures Follow respiratory viral panel

## 2024-09-28 NOTE — Progress Notes (Signed)
 Pt deeply asleep. Able to be aroused for brief periods, but quickly drops back to sleep. She received seroquel , morphine , and other somnolence-inducing medications upon arrival to the ER. Per husband, she hasn't slept in three days and was extremely tired.

## 2024-09-28 NOTE — Progress Notes (Addendum)
 PHARMACIST - PHYSICIAN COMMUNICATION  CONCERNING:  Enoxaparin  (Lovenox ) for DVT Prophylaxis    RECOMMENDATION: Patient was prescribed enoxaprin 40mg  q24 hours for VTE prophylaxis.   Filed Weights   09/27/24 2056  Weight: 55 kg (121 lb 4.1 oz)    Body mass index is 23.68 kg/m.  Estimated Creatinine Clearance: 34.4 mL/min (A) (by C-G formula based on SCr of 1.27 mg/dL (H)).   Based on [facility] policy patient is candidate for enoxaparin  40 mg SQ every 24 hours based on creatinine clearance improved to >30 ml/min.  DESCRIPTION: Pharmacy has adjusted enoxaparin  dose per [facility] policy.   Previous enoxaparin  30 mg every 24 hours order has been discontinued.  Patient is now receiving enoxaparin  40 mg every 24 hours    Leonor JAYSON Argyle, PharmD Pharmacy Resident  09/28/2024 10:41 AM

## 2024-09-28 NOTE — Progress Notes (Signed)
 Anticoagulation monitoring(Lovenox ):  64 yo  female ordered Lovenox  40 mg Q24h    Filed Weights   09/27/24 2056  Weight: 55 kg (121 lb 4.1 oz)   BMI  23.3   Lab Results  Component Value Date   CREATININE 1.52 (H) 09/27/2024   CREATININE 1.24 (H) 05/23/2024   CREATININE 0.71 03/16/2024   Estimated Creatinine Clearance: 28.7 mL/min (A) (by C-G formula based on SCr of 1.52 mg/dL (H)). Hemoglobin & Hematocrit     Component Value Date/Time   HGB 13.2 09/28/2024 0215   HGB 12.9 05/23/2024 1220   HCT 40.6 09/28/2024 0215   HCT 40.6 05/23/2024 1220     Per Protocol for Patient with estCrcl < 30 ml/min and BMI < 30, will transition to Lovenox  30 mg Q24h.

## 2024-09-29 ENCOUNTER — Other Ambulatory Visit: Payer: Self-pay

## 2024-09-29 DIAGNOSIS — M4807 Spinal stenosis, lumbosacral region: Secondary | ICD-10-CM

## 2024-09-29 LAB — BASIC METABOLIC PANEL WITH GFR
Anion gap: 5 (ref 5–15)
BUN: 16 mg/dL (ref 8–23)
CO2: 27 mmol/L (ref 22–32)
Calcium: 9.3 mg/dL (ref 8.9–10.3)
Chloride: 104 mmol/L (ref 98–111)
Creatinine, Ser: 0.82 mg/dL (ref 0.44–1.00)
GFR, Estimated: >60 mL/min
Glucose, Bld: 48 mg/dL — ABNORMAL LOW (ref 70–99)
Potassium: 4.9 mmol/L (ref 3.5–5.1)
Sodium: 136 mmol/L (ref 135–145)

## 2024-09-29 LAB — URINE CULTURE
Culture: <10000 — AB
Special Requests: NORMAL

## 2024-09-29 LAB — MISC LABCORP TEST (SEND OUT): Labcorp test code: 83935

## 2024-09-29 MED ORDER — ALBUTEROL SULFATE HFA 108 (90 BASE) MCG/ACT IN AERS
INHALATION_SPRAY | RESPIRATORY_TRACT | 0 refills | Status: DC
  Filled 2024-09-29: qty 18, fill #0

## 2024-09-29 MED ORDER — KETOROLAC TROMETHAMINE 15 MG/ML IJ SOLN
15.0000 mg | Freq: Once | INTRAMUSCULAR | Status: AC
  Administered 2024-09-29: 15 mg via INTRAMUSCULAR
  Filled 2024-09-29: qty 1

## 2024-09-29 MED ORDER — MORPHINE SULFATE (PF) 2 MG/ML IV SOLN
2.0000 mg | Freq: Once | INTRAVENOUS | Status: AC
  Administered 2024-09-29: 2 mg via INTRAMUSCULAR
  Filled 2024-09-29: qty 1

## 2024-09-29 MED ORDER — CEFTRIAXONE SODIUM 1 G IJ SOLR
1.0000 g | Freq: Once | INTRAMUSCULAR | Status: AC
  Administered 2024-09-29: 1 g via INTRAMUSCULAR
  Filled 2024-09-29: qty 10

## 2024-09-29 MED ORDER — OXYCODONE HCL 5 MG PO TABS
5.0000 mg | ORAL_TABLET | Freq: Four times a day (QID) | ORAL | 0 refills | Status: AC | PRN
  Filled 2024-09-29: qty 12, 3d supply, fill #0

## 2024-09-29 MED ORDER — ALBUTEROL SULFATE HFA 108 (90 BASE) MCG/ACT IN AERS: 2.0000 | INHALATION_SPRAY | Freq: Four times a day (QID) | RESPIRATORY_TRACT | 2 refills | Status: DC | PRN

## 2024-09-29 MED ORDER — ALBUTEROL SULFATE HFA 108 (90 BASE) MCG/ACT IN AERS
2.0000 | INHALATION_SPRAY | Freq: Four times a day (QID) | RESPIRATORY_TRACT | 0 refills | Status: AC | PRN
  Filled 2024-09-29: qty 18, 25d supply, fill #0

## 2024-09-29 MED ORDER — PHENAZOPYRIDINE HCL 95 MG PO TABS
95.0000 mg | ORAL_TABLET | Freq: Three times a day (TID) | ORAL | 0 refills | Status: AC | PRN
  Filled 2024-09-29: qty 30, 10d supply, fill #0
  Filled 2024-09-29: qty 10, 4d supply, fill #0

## 2024-09-29 NOTE — Progress Notes (Signed)
 Ambulatory oxygen trial: SpO2 at rest on RA: 94%. SpO2 walking on RA: hovered between 90-94%.

## 2024-09-29 NOTE — Discharge Summary (Signed)
 " Physician Discharge Summary  Patient: Jamie Schultz FMW:[id number] DOB: [birth year]   Code Status: Full Code Admit date: 09/27/2024 Discharge date: 09/29/2024 Disposition: Home, No home health services recommended PCP: Fernand Fredy RAMAN, MD  Recommendations for Outpatient Follow-up:  Follow up with PCP within 1-2 weeks Regarding general hospital follow up and preventative care  Discharge Diagnoses:  Principal Problem:   Urinary tract infection Active Problems:   SIRS (systemic inflammatory response syndrome) (HCC)   AKI (acute kidney injury)   High anion gap metabolic acidosis   Hyperkalemia   COPD (chronic obstructive pulmonary disease) (HCC)   Chronic musculoskeletal pain   Anxiety   Fibromyalgia   Essential hypertension, benign   Dysuria   Spinal stenosis of lumbosacral region  Brief Hospital Course Summary: Jamie Schultz is a 64 y.o. female with medical history significant for COPD, hypothyroidism, anxiety and ADD, nephrolithiasis, chronic pain syndrome, fibromyalgia, chronic dysuria on daily Azo, being admitted with UTI as well as AKI with anion gap metabolic acidosis.  She presented with generalized pain as well as flank pain.  She denies fever or chills, cough or shortness of breath In the ED she had a low-grade temp of 99, pulse 90, BP 100/77. Labs notable for leukocytosis of 11.4, creatinine 1.52, up from baseline of 0.71 about 6 months ago with bicarb 18 anion gap 17.  Minor electrolyte abnormalities with sodium 133 and potassium 5.2. Urinalysis showed positive nitrite negative leuks and rare bacteria Respiratory viral panel pending Chest x-ray showed mild diffuse chronic interstitial opacity CT abdomen and pelvis showed a 2 mm calcification bilateral kidneys-vascular calcifications versus nonobstructive nephrolithiasis.  Also showed severe atherosclerotic plaque of the aorta.   Patient treated with an NS bolus, oxycodone  and started on Macrobid  Admission requested   All  other chronic conditions were treated with home medications.    Discharge Condition: Stable, improved Recommended discharge diet: Regular healthy diet  Consultations: None   Procedures/Studies: None   Discharge Instructions     Discharge patient   Complete by: As directed    Discharge disposition: 01-Home or Self Care   Discharge patient date: 09/29/2024      Allergies as of 09/29/2024       Reactions   Bee Venom Anaphylaxis, Other (See Comments)   Bees/wasps/yellow jackets honey bee venom   Hydrocodone -acetaminophen  Hives   Iodinated Contrast Media Other (See Comments)   Patient states she had a previous reaction to iodinated contrast media agents. Premedicate.Unknown reaction Iodinated contrast media (substance)   Keflex [cephalexin] Anaphylaxis   Penicillins Anaphylaxis, Other (See Comments)   Sulfa Antibiotics Other (See Comments)   Renal failure   Ibuprofen  Hives   Sulfur Dioxide Hives   Levofloxacin  Other (See Comments)   levofloxacin    Nsaids Itching   Silicone Dermatitis, Itching, Other (See Comments)   silicones   Wound Dressing Adhesive Dermatitis   Codeine Itching, Rash, Dermatitis, Nausea Only   Cyclobenzaprine Other (See Comments)   Severe constipation   Fentanyl  Nausea And Vomiting, Other (See Comments)   Severe vomiting As of 01/06/21 pt states she is not allergic   Methadone Other (See Comments)   Change in mental status   Tape Itching, Rash, Other (See Comments)   Blisters skin, Please use paper tape   Tegretol [carbamazepine] Hives, Rash   Toradol  [ketorolac  Tromethamine ] Hives, Rash   Now toradol  ok to use (03/14/24)   Tramadol Hives, Rash   Tylenol  [acetaminophen ] Other (See Comments)   Vomiting blood  Medication List     STOP taking these medications    cetirizine  10 MG tablet Commonly known as: ZyrTEC  Allergy   Vitamin D3 1.25 MG (50000 UT) Caps       TAKE these medications    albuterol  108 (90 Base) MCG/ACT  inhaler Commonly known as: Ventolin  HFA INHALE 2 PUFFS BY MOUTH EVERY 6 HOURS AS NEEDED FOR WHEEZING OR SHORTNESS OF BREATH   alendronate  70 MG tablet Commonly known as: FOSAMAX  TAKE 1 TABLET BY MOUTH EVERY 7 DAYS ON AN EMPTY STOMACH WITH A FULL GLASS OF WATER    ALPRAZolam  1 MG tablet Commonly known as: XANAX  Take 1 mg by mouth 4 (four) times daily.   amphetamine -dextroamphetamine  30 MG tablet Commonly known as: ADDERALL Take 20 mg by mouth 2 (two) times daily.   EPINEPHrine  0.3 mg/0.3 mL Soaj injection Commonly known as: EpiPen  2-Pak Inject 0.3 mg into the muscle as needed for anaphylaxis.   EPINEPHrine  0.3 mg/0.3 mL Soaj injection Commonly known as: EpiPen  2-Pak Inject 0.3 mg into the muscle as needed for anaphylaxis.   hydrOXYzine  25 MG tablet Commonly known as: ATARAX  Take 1 tablet (25 mg total) by mouth daily.   levothyroxine  88 MCG tablet Commonly known as: SYNTHROID  TAKE 1 TABLET BY MOUTH EVERY DAY   Linzess  145 MCG Caps capsule Generic drug: linaclotide  TAKE ONE CAPSULE BY MOUTH EVERY DAY BEFORE BREAKFAST   magnesium  oxide 400 (240 Mg) MG tablet Commonly known as: MAG-OX Take 1 tablet (400 mg total) by mouth daily.   ondansetron  4 MG tablet Commonly known as: ZOFRAN  TAKE 1 TABLET BY MOUTH EVERY 8 HOURS AS NEEDED FOR NAUSEA OR VOMITING   oxybutynin  10 MG 24 hr tablet Commonly known as: DITROPAN -XL Take 1 tablet (10 mg total) by mouth daily.   oxyCODONE  5 MG immediate release tablet Commonly known as: Roxicodone  Take 1 tablet (5 mg total) by mouth every 6 (six) hours as needed for up to 3 days for severe pain (pain score 7-10).   phenazopyridine  95 MG tablet Commonly known as: PYRIDIUM  Take 1 tablet (95 mg total) by mouth 3 (three) times daily as needed for up to 3 days for pain.   pregabalin  50 MG capsule Commonly known as: LYRICA  Take 1 capsule (50 mg total) by mouth 3 (three) times daily.   QUEtiapine  400 MG tablet Commonly known as:  SEROQUEL  Take 1 tablet (400 mg total) by mouth at bedtime.   tiZANidine  2 MG tablet Commonly known as: ZANAFLEX  Take 1 tablet (2 mg total) by mouth 3 (three) times daily.        Follow-up Information     Fernand Fredy RAMAN, MD. Schedule an appointment as soon as possible for a visit in 1 week(s).   Specialty: Internal Medicine Contact information: [address] [place] [address] [phone]                 Subjective   Pt reports ***  All questions and concerns were addressed at time of discharge.  Objective  Blood pressure (!) 157/96, pulse 76, temperature 98.2 F (36.8 C), resp. rate 16, height 5' (1.524 m), weight 55 kg, SpO2 90%.   General: Pt is alert, awake, not in acute distress Cardiovascular: RRR, S1/S2 +, no rubs, no gallops Respiratory: CTA bilaterally, no wheezing, no rhonchi Abdominal: Soft, NT, ND, bowel sounds + Extremities: no edema, no cyanosis  The results of significant diagnostics from this hospitalization (including imaging, microbiology, ancillary and laboratory) are listed below for reference.  Imaging studies: CT ABDOMEN PELVIS WO CONTRAST Result Date: 09/28/2024 EXAM: CT ABDOMEN AND PELVIS WITHOUT CONTRAST 09/28/2024 01:11:52 AM TECHNIQUE: CT of the abdomen and pelvis was performed without the administration of intravenous contrast. Multiplanar reformatted images are provided for review. Automated exposure control, iterative reconstruction, and/or weight-based adjustment of the mA/kV was utilized to reduce the radiation dose to as low as reasonably achievable. COMPARISON: None available. CLINICAL HISTORY: Abdominal/flank pain, stone suspected. Abdominal pain and flank pain, stone suspected. FINDINGS: LOWER CHEST: No acute abnormality. LIVER: The liver is unremarkable. GALLBLADDER AND BILE DUCTS: Contracted gallbladder. No biliary ductal dilatation. SPLEEN: No acute abnormality. PANCREAS: No acute abnormality. ADRENAL GLANDS: No acute  abnormality. KIDNEYS, URETERS AND BLADDER: A 2 mm calcification associated with bilateral kidneys may represent vascular calcifications versus nonobstructive nephrolithiasis. The fluid-density lesion in the left kidney likely represents simple renal cysts. Per consensus, no follow-up is needed for simple Bosniak type 1 and 2 renal cysts, unless the patient has a malignancy history or risk factors. No ureterolithiasis. No hydronephrosis. No perinephric or periureteral stranding. Urinary bladder is unremarkable. GI AND BOWEL: Stomach demonstrates no acute abnormality. No small or large bowel thickening or dilatation. The appendix is unremarkable. Throughout the ascending colon and proximal /mid transverse colon. Sigmoid surgical sutures. PERITONEUM AND RETROPERITONEUM: No ascites. No free air. VASCULATURE: Severe atherosclerotic plaque of the aorta. LYMPH NODES: No lymphadenopathy. REPRODUCTIVE ORGANS: No acute abnormality. BONES AND SOFT TISSUES: L4-L5 posterior lateral surgical hardware fusion and laminectomy. Grade 1 anterolisthesis of L4 on L5. Intervertebral disc space vacuum phenomenon at the L5-S1 level. No acute osseous abnormality. No focal soft tissue abnormality. IMPRESSION: 1. 2 mm calcification associated with bilateral kidneys may represent vascular calcifications versus nonobstructive nephrolithiasis. 2. Severe atherosclerotic plaque of the aorta. Electronically signed by: Morgane Naveau MD 09/28/2024 01:31 AM EST RP Workstation: [id number]   DG Chest Port 1 View Result Date: 09/27/2024 CLINICAL DATA:  Cough EXAM: PORTABLE CHEST 1 VIEW COMPARISON:  12/31/2022 FINDINGS: Bilateral shoulder replacements and hardware in the cervical spine. No acute airspace disease, pleural effusion or pneumothorax. Mild diffuse chronic interstitial opacity. Normal cardiac size with aortic atherosclerosis. IMPRESSION: No active disease. Mild diffuse chronic interstitial opacity. Electronically Signed   By: Luke Bun  M.D.   On: 09/27/2024 22:11    Labs: Basic Metabolic Panel: Recent Labs  Lab 09/27/24 2313 09/28/24 0446 09/29/24 0733  NA 133* 137 136  K 5.2* 4.0 4.9  CL 99 104 104  CO2 18* 24 27  GLUCOSE 91 133* 48*  BUN 33* 32* 16  CREATININE 1.52* 1.27* 0.82  CALCIUM 9.9 9.0 9.3   CBC: Recent Labs  Lab 09/28/24 0215 09/28/24 0446  WBC 11.4* 10.0  HGB 13.2 12.3  HCT 40.6 37.8  MCV 90.2 91.5  PLT 439* 390   Microbiology: Results for orders placed or performed during the hospital encounter of 09/27/24  Urine Culture     Status: Abnormal   Collection Time: 09/27/24 11:02 PM   Specimen: Urine, Clean Catch  Result Value Ref Range Status   Specimen Description   Final    URINE, CLEAN CATCH Performed at [facility], [address] [place], [place] [address]    Special Requests   Final    Normal Performed at [facility], [address], [place], [place] [address]    Culture (A)  Final    <10,000 COLONIES/mL INSIGNIFICANT GROWTH Performed at [facility], [address], [place], [place] [address]  Report Status 09/29/2024 FINAL  Final  Resp panel by RT-PCR (RSV, Flu A&B, Covid) Anterior Nasal Swab     Status: None   Collection Time: 09/28/24  2:17 AM   Specimen: Anterior Nasal Swab  Result Value Ref Range Status   SARS Coronavirus 2 by RT PCR NEGATIVE NEGATIVE Final    Comment: (NOTE) SARS-CoV-2 target nucleic acids are NOT DETECTED.  The SARS-CoV-2 RNA is generally detectable in upper respiratory specimens during the acute phase of infection. The lowest concentration of SARS-CoV-2 viral copies this assay can detect is 138 copies/mL. A negative result does not preclude SARS-Cov-2 infection and should not be used as the sole basis for treatment or other patient management decisions. A negative result may occur with  improper specimen collection/handling, submission of specimen other than nasopharyngeal swab, presence of viral mutation(s)  within the areas targeted by this assay, and inadequate number of viral copies(<138 copies/mL). A negative result must be combined with clinical observations, patient history, and epidemiological information. The expected result is Negative.  Fact Sheet for Patients:  bloggercourse.com  Fact Sheet for Healthcare Providers:  seriousbroker.it  This test is no t yet approved or cleared by the [place]  FDA and  has been authorized for detection and/or diagnosis of SARS-CoV-2 by FDA under an Emergency Use Authorization (EUA). This EUA will remain  in effect (meaning this test can be used) for the duration of the COVID-19 declaration under Section 564(b)(1) of the Act, 21 U.S.C.section 360bbb-3(b)(1), unless the authorization is terminated  or revoked sooner.       Influenza A by PCR NEGATIVE NEGATIVE Final   Influenza B by PCR NEGATIVE NEGATIVE Final    Comment: (NOTE) The Xpert Xpress SARS-CoV-2/FLU/RSV plus assay is intended as an aid in the diagnosis of influenza from Nasopharyngeal swab specimens and should not be used as a sole basis for treatment. Nasal washings and aspirates are unacceptable for Xpert Xpress SARS-CoV-2/FLU/RSV testing.  Fact Sheet for Patients: bloggercourse.com  Fact Sheet for Healthcare Providers: seriousbroker.it  This test is not yet approved or cleared by the [place]  FDA and has been authorized for detection and/or diagnosis of SARS-CoV-2 by FDA under an Emergency Use Authorization (EUA). This EUA will remain in effect (meaning this test can be used) for the duration of the COVID-19 declaration under Section 564(b)(1) of the Act, 21 U.S.C. section 360bbb-3(b)(1), unless the authorization is terminated or revoked.     Resp Syncytial Virus by PCR NEGATIVE NEGATIVE Final    Comment: (NOTE) Fact Sheet for  Patients: bloggercourse.com  Fact Sheet for Healthcare Providers: seriousbroker.it  This test is not yet approved or cleared by the [place]  FDA and has been authorized for detection and/or diagnosis of SARS-CoV-2 by FDA under an Emergency Use Authorization (EUA). This EUA will remain in effect (meaning this test can be used) for the duration of the COVID-19 declaration under Section 564(b)(1) of the Act, 21 U.S.C. section 360bbb-3(b)(1), unless the authorization is terminated or revoked.  Performed at [facility], [address] [place], [place] [address]   MRSA Next Gen by PCR, Nasal     Status: None   Collection Time: 09/28/24  4:46 AM   Specimen: Nasal Mucosa; Nasal Swab  Result Value Ref Range Status   MRSA by PCR Next Gen NOT DETECTED NOT DETECTED Final    Comment: (NOTE) The GeneXpert MRSA Assay (FDA approved for NASAL specimens only), is one component of a comprehensive MRSA colonization surveillance program. It is not  intended to diagnose MRSA infection nor to guide or monitor treatment for MRSA infections. Test performance is not FDA approved in patients less than 32 years old. Performed at [facility], [address], [place], [place] [address]     Time coordinating discharge: Over 30 minutes  [name], MD  [facility] 09/29/2024, 2:54 PM  "

## 2024-09-29 NOTE — Plan of Care (Signed)

## 2024-09-29 NOTE — TOC Transition Note (Signed)
 Transition of Care [facility]) - Discharge Note   Patient Details  Name: Jamie Schultz MRN: [id number] Date of Birth: October 09, 1960  Transition of Care [facility]) CM/SW Contact:  Nathanael CHRISTELLA Ring, RN Phone Number: 09/29/2024, 3:47 PM   Clinical Narrative:    Patient and her husband confirmed that they can borrow a heater from someone so they will have some heat.  Her medications will be delivered to the room.  Cab voucher provided to get patient home.    Final next level of care: Home/Self Care Barriers to Discharge: Barriers Resolved   Patient Goals and CMS Choice            Discharge Placement                       Discharge Plan and Services Additional resources added to the After Visit Summary for                                       Social Drivers of Health (SDOH) Interventions SDOH Screenings   Food Insecurity: Food Insecurity Present (09/28/2024)  Housing: High Risk (09/28/2024)  Transportation Needs: Unmet Transportation Needs (09/28/2024)  Utilities: At Risk (09/28/2024)  Financial Resource Strain: Medium Risk (08/02/2024)   Received from [facility]  Social Connections: Socially Integrated (09/28/2024)  Tobacco Use: High Risk (09/27/2024)     Readmission Risk Interventions     No data to display

## 2024-09-29 NOTE — TOC Transition Note (Signed)
 Transition of Care [facility]) - Discharge Note   Patient Details  Name: Jamie Schultz MRN: [id number] Date of Birth: [birth year]  Transition of Care [facility]) CM/SW Contact:  Nathanael CHRISTELLA Ring, RN Phone Number: 09/29/2024, 2:34 PM   Clinical Narrative:    Patient and her husband report that they have no heat in the home.  They have paid their heat, [employer] but the gas company says that they cannot turn the gas back on until 2/5.  Resources provided on the AVS for utility assistance and shelters.  Will provide a Cab voucher home and suggest that they try to stay with friends or family.     Final next level of care: Home/Self Care Barriers to Discharge: Barriers Resolved   Patient Goals and CMS Choice            Discharge Placement                       Discharge Plan and Services Additional resources added to the After Visit Summary for                                       Social Drivers of Health (SDOH) Interventions SDOH Screenings   Food Insecurity: Food Insecurity Present (09/28/2024)  Housing: High Risk (09/28/2024)  Transportation Needs: Unmet Transportation Needs (09/28/2024)  Utilities: At Risk (09/28/2024)  Financial Resource Strain: Medium Risk (08/02/2024)   Received from [facility]  Social Connections: Socially Integrated (09/28/2024)  Tobacco Use: High Risk (09/27/2024)     Readmission Risk Interventions     No data to display

## 2024-09-29 NOTE — Progress Notes (Signed)
 All discharge requirements met.

## 2024-09-29 NOTE — Plan of Care (Signed)

## 2024-10-02 ENCOUNTER — Ambulatory Visit: Admitting: Internal Medicine

## 2024-10-03 ENCOUNTER — Other Ambulatory Visit: Payer: Self-pay | Admitting: Internal Medicine

## 2024-10-03 DIAGNOSIS — M4807 Spinal stenosis, lumbosacral region: Secondary | ICD-10-CM

## 2024-10-04 ENCOUNTER — Telehealth: Payer: Self-pay

## 2024-10-04 NOTE — Telephone Encounter (Signed)
 Needs pain medications, says she is hurting bad and doesn't want to go back to the hospital.
